# Patient Record
Sex: Female | Born: 1950 | Race: White | Hispanic: No | Marital: Married | Smoking: Former smoker
Health system: Southern US, Community
[De-identification: ages and names within clinical notes are randomized; demographics above are authoritative.]

## PROBLEM LIST (undated history)

## (undated) DIAGNOSIS — G8929 Other chronic pain: Secondary | ICD-10-CM

## (undated) DIAGNOSIS — Z8719 Personal history of other diseases of the digestive system: Secondary | ICD-10-CM

## (undated) DIAGNOSIS — Z972 Presence of dental prosthetic device (complete) (partial): Secondary | ICD-10-CM

## (undated) DIAGNOSIS — K5190 Ulcerative colitis, unspecified, without complications: Secondary | ICD-10-CM

## (undated) DIAGNOSIS — K219 Gastro-esophageal reflux disease without esophagitis: Secondary | ICD-10-CM

## (undated) DIAGNOSIS — E785 Hyperlipidemia, unspecified: Secondary | ICD-10-CM

## (undated) DIAGNOSIS — R519 Headache, unspecified: Secondary | ICD-10-CM

## (undated) DIAGNOSIS — G20 Parkinson's disease: Secondary | ICD-10-CM

## (undated) DIAGNOSIS — M5030 Other cervical disc degeneration, unspecified cervical region: Secondary | ICD-10-CM

## (undated) DIAGNOSIS — I739 Peripheral vascular disease, unspecified: Secondary | ICD-10-CM

## (undated) DIAGNOSIS — G20A1 Parkinson's disease without dyskinesia, without mention of fluctuations: Secondary | ICD-10-CM

## (undated) DIAGNOSIS — F329 Major depressive disorder, single episode, unspecified: Secondary | ICD-10-CM

## (undated) DIAGNOSIS — R0600 Dyspnea, unspecified: Secondary | ICD-10-CM

## (undated) DIAGNOSIS — E119 Type 2 diabetes mellitus without complications: Secondary | ICD-10-CM

## (undated) DIAGNOSIS — K449 Diaphragmatic hernia without obstruction or gangrene: Secondary | ICD-10-CM

## (undated) DIAGNOSIS — I6523 Occlusion and stenosis of bilateral carotid arteries: Secondary | ICD-10-CM

## (undated) DIAGNOSIS — F419 Anxiety disorder, unspecified: Secondary | ICD-10-CM

## (undated) DIAGNOSIS — Z7982 Long term (current) use of aspirin: Secondary | ICD-10-CM

## (undated) DIAGNOSIS — M545 Low back pain, unspecified: Secondary | ICD-10-CM

## (undated) DIAGNOSIS — R350 Frequency of micturition: Secondary | ICD-10-CM

## (undated) DIAGNOSIS — N3010 Interstitial cystitis (chronic) without hematuria: Secondary | ICD-10-CM

## (undated) DIAGNOSIS — D649 Anemia, unspecified: Secondary | ICD-10-CM

## (undated) DIAGNOSIS — R7303 Prediabetes: Secondary | ICD-10-CM

## (undated) DIAGNOSIS — J449 Chronic obstructive pulmonary disease, unspecified: Secondary | ICD-10-CM

## (undated) DIAGNOSIS — Z7901 Long term (current) use of anticoagulants: Secondary | ICD-10-CM

## (undated) DIAGNOSIS — I700 Atherosclerosis of aorta: Secondary | ICD-10-CM

## (undated) DIAGNOSIS — M519 Unspecified thoracic, thoracolumbar and lumbosacral intervertebral disc disorder: Secondary | ICD-10-CM

## (undated) DIAGNOSIS — I6789 Other cerebrovascular disease: Secondary | ICD-10-CM

## (undated) DIAGNOSIS — G459 Transient cerebral ischemic attack, unspecified: Secondary | ICD-10-CM

## (undated) DIAGNOSIS — F32A Depression, unspecified: Secondary | ICD-10-CM

## (undated) DIAGNOSIS — R51 Headache: Secondary | ICD-10-CM

## (undated) DIAGNOSIS — E871 Hypo-osmolality and hyponatremia: Secondary | ICD-10-CM

## (undated) DIAGNOSIS — T753XXA Motion sickness, initial encounter: Secondary | ICD-10-CM

## (undated) DIAGNOSIS — I10 Essential (primary) hypertension: Secondary | ICD-10-CM

## (undated) DIAGNOSIS — Z87440 Personal history of urinary (tract) infections: Secondary | ICD-10-CM

## (undated) DIAGNOSIS — M1990 Unspecified osteoarthritis, unspecified site: Secondary | ICD-10-CM

## (undated) DIAGNOSIS — I639 Cerebral infarction, unspecified: Secondary | ICD-10-CM

## (undated) DIAGNOSIS — M549 Dorsalgia, unspecified: Secondary | ICD-10-CM

## (undated) HISTORY — PX: BLADDER SUSPENSION: SHX72

## (undated) HISTORY — PX: ABDOMINAL HYSTERECTOMY: SHX81

## (undated) HISTORY — DX: Parkinson's disease: G20

## (undated) HISTORY — PX: TONSILLECTOMY: SUR1361

## (undated) HISTORY — DX: Parkinson's disease without dyskinesia, without mention of fluctuations: G20.A1

## (undated) HISTORY — PX: HERNIA REPAIR: SHX51

## (undated) HISTORY — PX: CHOLECYSTECTOMY: SHX55

## (undated) HISTORY — PX: CARDIAC CATHETERIZATION: SHX172

## (undated) HISTORY — DX: Type 2 diabetes mellitus without complications: E11.9

## (undated) HISTORY — PX: HYSTERECTOMY ABDOMINAL WITH SALPINGO-OOPHORECTOMY: SHX6792

## (undated) HISTORY — DX: Personal history of urinary (tract) infections: Z87.440

## (undated) HISTORY — PX: COLONOSCOPY: SHX174

## (undated) HISTORY — PX: BACK SURGERY: SHX140

## (undated) HISTORY — PX: UPPER GI ENDOSCOPY: SHX6162

## (undated) HISTORY — PX: DIAGNOSTIC LAPAROSCOPY: SUR761

## (undated) HISTORY — PX: CORONARY ANGIOPLASTY: SHX604

---

## 1998-03-30 ENCOUNTER — Emergency Department (HOSPITAL_COMMUNITY): Admission: EM | Admit: 1998-03-30 | Discharge: 1998-03-30 | Payer: Self-pay | Admitting: Emergency Medicine

## 2013-07-24 DIAGNOSIS — I639 Cerebral infarction, unspecified: Secondary | ICD-10-CM

## 2013-07-24 DIAGNOSIS — G459 Transient cerebral ischemic attack, unspecified: Secondary | ICD-10-CM

## 2013-07-24 HISTORY — DX: Cerebral infarction, unspecified: I63.9

## 2013-07-24 HISTORY — DX: Transient cerebral ischemic attack, unspecified: G45.9

## 2014-02-04 ENCOUNTER — Other Ambulatory Visit (HOSPITAL_COMMUNITY): Payer: Self-pay | Admitting: Family Medicine

## 2014-02-04 ENCOUNTER — Ambulatory Visit (HOSPITAL_COMMUNITY)
Admission: RE | Admit: 2014-02-04 | Discharge: 2014-02-04 | Disposition: A | Discharge status: Home / Self Care | Payer: BC Managed Care – PPO | Source: Ambulatory Visit | Attending: Family Medicine | Admitting: Family Medicine

## 2014-02-04 DIAGNOSIS — M79609 Pain in unspecified limb: Secondary | ICD-10-CM

## 2014-02-04 DIAGNOSIS — M25562 Pain in left knee: Secondary | ICD-10-CM

## 2014-02-04 DIAGNOSIS — M7989 Other specified soft tissue disorders: Secondary | ICD-10-CM

## 2014-02-04 DIAGNOSIS — M25569 Pain in unspecified knee: Secondary | ICD-10-CM | POA: Insufficient documentation

## 2014-02-04 NOTE — Progress Notes (Signed)
VASCULAR LAB PRELIMINARY  PRELIMINARY  PRELIMINARY  PRELIMINARY  Left lower extremity venous duplex completed.    Preliminary report:  Left:  No evidence of DVT, superficial thrombosis, or Baker's cyst.  Siddhartha Hoback, RVT 02/04/2014, 1:23 PM

## 2014-02-10 ENCOUNTER — Encounter (HOSPITAL_BASED_OUTPATIENT_CLINIC_OR_DEPARTMENT_OTHER): Payer: Self-pay | Admitting: *Deleted

## 2014-02-10 NOTE — Progress Notes (Signed)
Called for ekg bmet from pcp-if not will need dos

## 2014-02-12 ENCOUNTER — Other Ambulatory Visit: Payer: Self-pay | Admitting: Orthopedic Surgery

## 2014-02-13 ENCOUNTER — Encounter (HOSPITAL_BASED_OUTPATIENT_CLINIC_OR_DEPARTMENT_OTHER): Payer: BC Managed Care – PPO | Admitting: Anesthesiology

## 2014-02-13 ENCOUNTER — Encounter (HOSPITAL_BASED_OUTPATIENT_CLINIC_OR_DEPARTMENT_OTHER): Payer: Self-pay | Admitting: *Deleted

## 2014-02-13 ENCOUNTER — Encounter (HOSPITAL_BASED_OUTPATIENT_CLINIC_OR_DEPARTMENT_OTHER)
Admission: RE | Disposition: A | Discharge status: Home / Self Care | Payer: Self-pay | Source: Ambulatory Visit | Attending: Orthopedic Surgery

## 2014-02-13 ENCOUNTER — Ambulatory Visit (HOSPITAL_BASED_OUTPATIENT_CLINIC_OR_DEPARTMENT_OTHER)
Admission: RE | Admit: 2014-02-13 | Discharge: 2014-02-13 | Disposition: A | Discharge status: Home / Self Care | Payer: BC Managed Care – PPO | Source: Ambulatory Visit | Attending: Orthopedic Surgery | Admitting: Orthopedic Surgery

## 2014-02-13 ENCOUNTER — Ambulatory Visit (HOSPITAL_BASED_OUTPATIENT_CLINIC_OR_DEPARTMENT_OTHER): Payer: BC Managed Care – PPO | Admitting: Anesthesiology

## 2014-02-13 DIAGNOSIS — M23329 Other meniscus derangements, posterior horn of medial meniscus, unspecified knee: Secondary | ICD-10-CM | POA: Insufficient documentation

## 2014-02-13 DIAGNOSIS — J449 Chronic obstructive pulmonary disease, unspecified: Secondary | ICD-10-CM | POA: Insufficient documentation

## 2014-02-13 DIAGNOSIS — E669 Obesity, unspecified: Secondary | ICD-10-CM | POA: Insufficient documentation

## 2014-02-13 DIAGNOSIS — Z6833 Body mass index (BMI) 33.0-33.9, adult: Secondary | ICD-10-CM | POA: Insufficient documentation

## 2014-02-13 DIAGNOSIS — F17200 Nicotine dependence, unspecified, uncomplicated: Secondary | ICD-10-CM | POA: Insufficient documentation

## 2014-02-13 DIAGNOSIS — K219 Gastro-esophageal reflux disease without esophagitis: Secondary | ICD-10-CM | POA: Insufficient documentation

## 2014-02-13 DIAGNOSIS — J4489 Other specified chronic obstructive pulmonary disease: Secondary | ICD-10-CM | POA: Insufficient documentation

## 2014-02-13 DIAGNOSIS — M129 Arthropathy, unspecified: Secondary | ICD-10-CM | POA: Insufficient documentation

## 2014-02-13 DIAGNOSIS — I10 Essential (primary) hypertension: Secondary | ICD-10-CM | POA: Insufficient documentation

## 2014-02-13 DIAGNOSIS — K5190 Ulcerative colitis, unspecified, without complications: Secondary | ICD-10-CM | POA: Insufficient documentation

## 2014-02-13 HISTORY — DX: Gastro-esophageal reflux disease without esophagitis: K21.9

## 2014-02-13 HISTORY — DX: Frequency of micturition: R35.0

## 2014-02-13 HISTORY — DX: Essential (primary) hypertension: I10

## 2014-02-13 HISTORY — DX: Unspecified osteoarthritis, unspecified site: M19.90

## 2014-02-13 HISTORY — PX: KNEE ARTHROSCOPY: SHX127

## 2014-02-13 HISTORY — DX: Chronic obstructive pulmonary disease, unspecified: J44.9

## 2014-02-13 HISTORY — DX: Ulcerative colitis, unspecified, without complications: K51.90

## 2014-02-13 LAB — POCT HEMOGLOBIN-HEMACUE: HEMOGLOBIN: 12.4 g/dL (ref 12.0–15.0)

## 2014-02-13 SURGERY — ARTHROSCOPY, KNEE
Anesthesia: General | Site: Knee | Laterality: Left

## 2014-02-13 MED ORDER — FENTANYL CITRATE 0.05 MG/ML IJ SOLN
INTRAMUSCULAR | Status: DC | PRN
  Administered 2014-02-13: 25 ug via INTRAVENOUS
  Administered 2014-02-13 (×2): 50 ug via INTRAVENOUS
  Administered 2014-02-13: 25 ug via INTRAVENOUS

## 2014-02-13 MED ORDER — PROPOFOL 10 MG/ML IV BOLUS
INTRAVENOUS | Status: DC | PRN
  Administered 2014-02-13: 200 mg via INTRAVENOUS

## 2014-02-13 MED ORDER — OXYCODONE HCL 5 MG/5ML PO SOLN: 5.0000 mg | Freq: Once | ORAL | Status: AC | PRN

## 2014-02-13 MED ORDER — EPINEPHRINE HCL 1 MG/ML IJ SOLN
INTRAMUSCULAR | Status: AC
  Filled 2014-02-13: qty 1

## 2014-02-13 MED ORDER — MIDAZOLAM HCL 5 MG/5ML IJ SOLN
INTRAMUSCULAR | Status: DC | PRN
  Administered 2014-02-13: 2 mg via INTRAVENOUS

## 2014-02-13 MED ORDER — ONDANSETRON HCL 4 MG/2ML IJ SOLN
INTRAMUSCULAR | Status: DC | PRN
  Administered 2014-02-13: 4 mg via INTRAVENOUS

## 2014-02-13 MED ORDER — LIDOCAINE HCL (CARDIAC) 20 MG/ML IV SOLN
INTRAVENOUS | Status: DC | PRN
  Administered 2014-02-13: 100 mg via INTRAVENOUS

## 2014-02-13 MED ORDER — OXYCODONE HCL 5 MG PO TABS
5.0000 mg | ORAL_TABLET | Freq: Once | ORAL | Status: AC | PRN
  Administered 2014-02-13: 5 mg via ORAL

## 2014-02-13 MED ORDER — SODIUM CHLORIDE 0.9 % IR SOLN
Status: DC | PRN
  Administered 2014-02-13: 3000 mL

## 2014-02-13 MED ORDER — LACTATED RINGERS IV SOLN
INTRAVENOUS | Status: DC
  Administered 2014-02-13 (×2): via INTRAVENOUS

## 2014-02-13 MED ORDER — FENTANYL CITRATE 0.05 MG/ML IJ SOLN
INTRAMUSCULAR | Status: AC
  Filled 2014-02-13: qty 6

## 2014-02-13 MED ORDER — CHLORHEXIDINE GLUCONATE 4 % EX LIQD
60.0000 mL | Freq: Once | CUTANEOUS | Status: DC
Start: 2014-02-13 — End: 2014-02-13

## 2014-02-13 MED ORDER — MIDAZOLAM HCL 2 MG/2ML IJ SOLN
INTRAMUSCULAR | Status: AC
  Filled 2014-02-13: qty 2

## 2014-02-13 MED ORDER — DEXAMETHASONE SODIUM PHOSPHATE 10 MG/ML IJ SOLN
INTRAMUSCULAR | Status: DC | PRN
  Administered 2014-02-13: 10 mg via INTRAVENOUS

## 2014-02-13 MED ORDER — CEFAZOLIN SODIUM-DEXTROSE 2-3 GM-% IV SOLR
INTRAVENOUS | Status: AC
  Filled 2014-02-13: qty 50

## 2014-02-13 MED ORDER — OXYCODONE HCL 5 MG PO TABS
ORAL_TABLET | ORAL | Status: AC
  Filled 2014-02-13: qty 1

## 2014-02-13 MED ORDER — OXYCODONE HCL 10 MG PO TABS: 10.0000 mg | ORAL_TABLET | Freq: Four times a day (QID) | ORAL | Status: DC | PRN

## 2014-02-13 MED ORDER — CEFAZOLIN SODIUM-DEXTROSE 2-3 GM-% IV SOLR
2.0000 g | INTRAVENOUS | Status: AC
  Administered 2014-02-13: 2 g via INTRAVENOUS

## 2014-02-13 MED ORDER — MIDAZOLAM HCL 2 MG/2ML IJ SOLN: 1.0000 mg | INTRAMUSCULAR | Status: DC | PRN

## 2014-02-13 MED ORDER — OXYCODONE-ACETAMINOPHEN 5-325 MG PO TABS: 1.0000 | ORAL_TABLET | Freq: Four times a day (QID) | ORAL | Status: DC | PRN

## 2014-02-13 MED ORDER — FENTANYL CITRATE 0.05 MG/ML IJ SOLN
INTRAMUSCULAR | Status: AC
  Filled 2014-02-13: qty 2

## 2014-02-13 MED ORDER — BUPIVACAINE HCL (PF) 0.25 % IJ SOLN
INTRAMUSCULAR | Status: AC
  Filled 2014-02-13: qty 30

## 2014-02-13 MED ORDER — ONDANSETRON HCL 4 MG/2ML IJ SOLN: 4.0000 mg | Freq: Once | INTRAMUSCULAR | Status: DC | PRN

## 2014-02-13 MED ORDER — FENTANYL CITRATE 0.05 MG/ML IJ SOLN: 50.0000 ug | INTRAMUSCULAR | Status: DC | PRN

## 2014-02-13 MED ORDER — BUPIVACAINE HCL (PF) 0.5 % IJ SOLN
INTRAMUSCULAR | Status: DC | PRN
  Administered 2014-02-13: 20 mL

## 2014-02-13 MED ORDER — HYDROMORPHONE HCL PF 1 MG/ML IJ SOLN: 0.2500 mg | INTRAMUSCULAR | Status: DC | PRN

## 2014-02-13 SURGICAL SUPPLY — 40 items
BANDAGE ELASTIC 6 VELCRO ST LF (GAUZE/BANDAGES/DRESSINGS) ×2 IMPLANT
BLADE 4.2CUDA (BLADE) IMPLANT
BLADE GREAT WHITE 4.2 (BLADE) ×2 IMPLANT
CANISTER SUCT 3000ML (MISCELLANEOUS) IMPLANT
CUTTER MENISCUS  4.2MM (BLADE) ×1
CUTTER MENISCUS 4.2MM (BLADE) ×1 IMPLANT
DRAPE ARTHROSCOPY W/POUCH 114 (DRAPES) ×2 IMPLANT
DRSG EMULSION OIL 3X3 NADH (GAUZE/BANDAGES/DRESSINGS) ×2 IMPLANT
DURAPREP 26ML APPLICATOR (WOUND CARE) ×2 IMPLANT
ELECT MENISCUS 165MM 90D (ELECTRODE) IMPLANT
ELECT REM PT RETURN 9FT ADLT (ELECTROSURGICAL)
ELECTRODE REM PT RTRN 9FT ADLT (ELECTROSURGICAL) IMPLANT
GAUZE SPONGE 4X4 12PLY STRL (GAUZE/BANDAGES/DRESSINGS) ×2 IMPLANT
GLOVE BIO SURGEON STRL SZ7 (GLOVE) ×2 IMPLANT
GLOVE BIOGEL PI IND STRL 7.5 (GLOVE) ×2 IMPLANT
GLOVE BIOGEL PI IND STRL 8 (GLOVE) ×2 IMPLANT
GLOVE BIOGEL PI INDICATOR 7.5 (GLOVE) ×2
GLOVE BIOGEL PI INDICATOR 8 (GLOVE) ×2
GLOVE ECLIPSE 7.5 STRL STRAW (GLOVE) ×4 IMPLANT
GOWN STRL REUS W/ TWL LRG LVL3 (GOWN DISPOSABLE) ×1 IMPLANT
GOWN STRL REUS W/ TWL XL LVL3 (GOWN DISPOSABLE) ×1 IMPLANT
GOWN STRL REUS W/TWL LRG LVL3 (GOWN DISPOSABLE) ×1
GOWN STRL REUS W/TWL XL LVL3 (GOWN DISPOSABLE) ×3 IMPLANT
HOLDER KNEE FOAM BLUE (MISCELLANEOUS) ×2 IMPLANT
KNEE WRAP E Z 3 GEL PACK (MISCELLANEOUS) ×2 IMPLANT
MANIFOLD NEPTUNE II (INSTRUMENTS) ×2 IMPLANT
NDL SAFETY ECLIPSE 18X1.5 (NEEDLE) IMPLANT
NEEDLE HYPO 18GX1.5 SHARP (NEEDLE)
PACK ARTHROSCOPY DSU (CUSTOM PROCEDURE TRAY) ×2 IMPLANT
PACK BASIN DAY SURGERY FS (CUSTOM PROCEDURE TRAY) ×2 IMPLANT
PAD CAST 4YDX4 CTTN HI CHSV (CAST SUPPLIES) ×1 IMPLANT
PADDING CAST COTTON 4X4 STRL (CAST SUPPLIES) ×1
PENCIL BUTTON HOLSTER BLD 10FT (ELECTRODE) IMPLANT
SET ARTHROSCOPY TUBING (MISCELLANEOUS) ×1
SET ARTHROSCOPY TUBING LN (MISCELLANEOUS) ×1 IMPLANT
SUT ETHILON 4 0 PS 2 18 (SUTURE) IMPLANT
SYRINGE 6CC (MISCELLANEOUS) ×2 IMPLANT
TOWEL OR 17X24 6PK STRL BLUE (TOWEL DISPOSABLE) IMPLANT
TOWEL OR NON WOVEN STRL DISP B (DISPOSABLE) ×2 IMPLANT
WATER STERILE IRR 1000ML POUR (IV SOLUTION) ×2 IMPLANT

## 2014-02-13 NOTE — Op Note (Signed)
02/13/2014  2:30 PM  PATIENT:  Tammy Lamb  63 y.o. female  PRE-OPERATIVE DIAGNOSIS:  MEDIAL MENISCUS TEAR LEFT KNEE  POST-OPERATIVE DIAGNOSIS:  MEDIAL MENISCUS TEAR LEFT KNEE  PROCEDURE:  Procedure(s): LEFT ARTHROSCOPY KNEE, PARTIAL MEDIAL MENISECTOMY AND PLICA, CHONDROPLASTY OF PATELLA FEMORAL JOINT (Left)  SURGEON:  Surgeon(s) and Role:    * Alta Corning, MD - Primary  PHYSICIAN ASSISTANT:   ASSISTANTS: bethune   ANESTHESIA:   general  EBL:  Total I/O In: 1000 [I.V.:1000] Out: -   BLOOD ADMINISTERED:none  DRAINS: none   LOCAL MEDICATIONS USED:  MARCAINE     SPECIMEN:  No Specimen  DISPOSITION OF SPECIMEN:  N/A  COUNTS:  YES  TOURNIQUET:  * No tourniquets in log *  DICTATION: .Other Dictation: Dictation Number [id number]  PLAN OF CARE: Discharge to home after PACU  PATIENT DISPOSITION:  PACU - hemodynamically stable.   Delay start of Pharmacological VTE agent (>24hrs) due to surgical blood loss or risk of bleeding: no

## 2014-02-13 NOTE — Anesthesia Procedure Notes (Signed)
Procedure Name: LMA Insertion Date/Time: 02/13/2014 1:51 PM Performed by: Lieutenant Diego Pre-anesthesia Checklist: Patient identified, Emergency Drugs available, Suction available and Patient being monitored Patient Re-evaluated:Patient Re-evaluated prior to inductionOxygen Delivery Method: Circle System Utilized Preoxygenation: Pre-oxygenation with 100% oxygen Intubation Type: IV induction Ventilation: Mask ventilation without difficulty LMA: LMA inserted LMA Size: 4.0 Number of attempts: 1 Airway Equipment and Method: bite block Placement Confirmation: positive ETCO2 and breath sounds checked- equal and bilateral Tube secured with: Tape Dental Injury: Teeth and Oropharynx as per pre-operative assessment

## 2014-02-13 NOTE — Anesthesia Postprocedure Evaluation (Signed)
  Anesthesia Post-op Note  Patient: Tammy Lamb  Procedure(s) Performed: Procedure(s): LEFT ARTHROSCOPY KNEE, PARTIAL MEDIAL MENISECTOMY AND PLICA, CHONDROPLASTY OF PATELLA FEMORAL JOINT (Left)  Patient Location: PACU  Anesthesia Type:General  Level of Consciousness: awake and alert   Airway and Oxygen Therapy: Patient Spontanous Breathing  Post-op Pain: mild  Post-op Assessment: Post-op Vital signs reviewed  Post-op Vital Signs: stable  Last Vitals:  Filed Vitals:   02/13/14 1559  BP: 140/78  Pulse: 71  Temp: 36.6 C  Resp: 16    Complications: No apparent anesthesia complications

## 2014-02-13 NOTE — Transfer of Care (Signed)
Immediate Anesthesia Transfer of Care Note  Patient: Tammy Lamb  Procedure(s) Performed: Procedure(s): LEFT ARTHROSCOPY KNEE, PARTIAL MEDIAL MENISECTOMY AND PLICA, CHONDROPLASTY OF PATELLA FEMORAL JOINT (Left)  Patient Location: PACU  Anesthesia Type:General  Level of Consciousness: sedated  Airway & Oxygen Therapy: Patient Spontanous Breathing and Patient connected to face mask oxygen  Post-op Assessment: Report given to PACU RN and Post -op Vital signs reviewed and stable  Post vital signs: Reviewed and stable  Complications: No apparent anesthesia complications

## 2014-02-13 NOTE — Anesthesia Preprocedure Evaluation (Signed)
Anesthesia Evaluation  Patient identified by MRN, date of birth, ID band Patient awake    Reviewed: Allergy & Precautions, H&P , NPO status , Patient's Chart, lab work & pertinent test results, reviewed documented beta blocker date and time   Airway Mallampati: II TM Distance: >3 FB Neck ROM: Full    Dental  (+) Partial Upper, Dental Advisory Given   Pulmonary Current Smoker,  breath sounds clear to auscultation        Cardiovascular hypertension, Rhythm:Regular Rate:Normal     Neuro/Psych    GI/Hepatic   Endo/Other    Renal/GU      Musculoskeletal   Abdominal (+) + obese,   Peds  Hematology   Anesthesia Other Findings   Reproductive/Obstetrics                           Anesthesia Physical Anesthesia Plan  ASA: III  Anesthesia Plan: General   Post-op Pain Management:    Induction: Intravenous  Airway Management Planned: LMA  Additional Equipment:   Intra-op Plan:   Post-operative Plan:   Informed Consent: I have reviewed the patients History and Physical, chart, labs and discussed the procedure including the risks, benefits and alternatives for the proposed anesthesia with the patient or authorized representative who has indicated his/her understanding and acceptance.   Dental advisory given  Plan Discussed with: CRNA and Anesthesiologist  Anesthesia Plan Comments: (MMT L. Knee Htn GERD H/O ullcerartive colitis  Plan GA with LMA)        Anesthesia Quick Evaluation

## 2014-02-13 NOTE — H&P (Signed)
PREOPERATIVE H&P  Chief Complaint: l knee pain  HPI: Tammy Lamb is a 63 y.o. female who presents for evaluation of l knee pain. It has been present for 6 mnoths and has been worsening. She has failed conservative measures. Pain is rated as moderate.  Past Medical History  Diagnosis Date  . Hypertension   . GERD (gastroesophageal reflux disease)   . Arthritis   . Colitis, ulcerative   . Urinary frequency   . COPD (chronic obstructive pulmonary disease)    Past Surgical History  Procedure Laterality Date  . Tonsillectomy    . Cholecystectomy    . Abdominal hysterectomy      BSO  . Diagnostic laparoscopy      adhesions  . Bladder suspension    . Bladder suspension      tac  . Colonoscopy    . Upper gi endoscopy     History   Social History  . Marital Status: Married    Spouse Name: N/A    Number of Children: N/A  . Years of Education: N/A   Social History Main Topics  . Smoking status: Current Every Day Smoker -- 1.00 packs/day  . Smokeless tobacco: None  . Alcohol Use: Yes  . Drug Use: No  . Sexual Activity: None   Other Topics Concern  . None   Social History Narrative  . None   History reviewed. No pertinent family history. Allergies  Allergen Reactions  . Sulfa Antibiotics Nausea And Vomiting   Prior to Admission medications   Medication Sig Start Date End Date Taking? Authorizing Provider  albuterol (PROVENTIL HFA;VENTOLIN HFA) 108 (90 BASE) MCG/ACT inhaler Inhale into the lungs every 6 (six) hours as needed for wheezing or shortness of breath.   Yes Historical Provider, MD  atorvastatin (LIPITOR) 80 MG tablet Take 80 mg by mouth daily at 6 PM.   Yes Historical Provider, MD  carvedilol (COREG) 12.5 MG tablet Take 12.5 mg by mouth 2 (two) times daily with a meal.   Yes Historical Provider, MD  cholecalciferol (VITAMIN D) 1000 UNITS tablet Take 1,000 Units by mouth daily.   Yes Historical Provider, MD  Cinnamon 500 MG TABS Take by mouth.   Yes  Historical Provider, MD  citalopram (CELEXA) 20 MG tablet Take 20 mg by mouth every evening.   Yes Historical Provider, MD  gabapentin (NEURONTIN) 300 MG capsule Take 300 mg by mouth 3 (three) times daily.   Yes Historical Provider, MD  hydrochlorothiazide (HYDRODIURIL) 25 MG tablet Take 25 mg by mouth daily.   Yes Historical Provider, MD  HYDROcodone-acetaminophen (NORCO/VICODIN) 5-325 MG per tablet Take 1 tablet by mouth every 6 (six) hours as needed for moderate pain.   Yes Historical Provider, MD  loperamide (IMODIUM) 2 MG capsule Take by mouth as needed for diarrhea or loose stools.   Yes Historical Provider, MD  omeprazole-sodium bicarbonate (ZEGERID) 40-1100 MG per capsule Take 1 capsule by mouth 2 (two) times daily before a meal.   Yes Historical Provider, MD  pentosan polysulfate (ELMIRON) 100 MG capsule Take 100 mg by mouth 2 (two) times daily before a meal.   Yes Historical Provider, MD  ramipril (ALTACE) 10 MG capsule Take 10 mg by mouth daily.   Yes Historical Provider, MD  ranitidine (ZANTAC) 150 MG tablet Take 150 mg by mouth as needed for heartburn.   Yes Historical Provider, MD  Oxycodone HCl 10 MG TABS Take 1 tablet (10 mg total) by mouth every 6 (six) hours  as needed (Pain.). 02/13/14   Erlene Senters, PA-C  Oxycodone HCl 10 MG TABS Take 1 tablet (10 mg total) by mouth every 6 (six) hours as needed (pain.). 02/13/14   Erlene Senters, PA-C     Positive ROS: none  All other systems have been reviewed and were otherwise negative with the exception of those mentioned in the HPI and as above.  Physical Exam: Filed Vitals:   02/13/14 1249  BP: 118/81  Pulse: 67  Temp: 98.7 F (37.1 C)  Resp: 18    General: Alert, no acute distress Cardiovascular: No pedal edema Respiratory: No cyanosis, no use of accessory musculature GI: No organomegaly, abdomen is soft and non-tender Skin: No lesions in the area of chief complaint Neurologic: Sensation intact distally Psychiatric:  Patient is competent for consent with normal mood and affect Lymphatic: No axillary or cervical lymphadenopathy  MUSCULOSKELETAL: l knee: + ttp over med jt line  +[place]  Assessment/Plan: MEDIAL MENISCUS TEAR LEFT KNEE Plan for Procedure(s): LEFT ARTHROSCOPY KNEE  The risks benefits and alternatives were discussed with the patient including but not limited to the risks of nonoperative treatment, versus surgical intervention including infection, bleeding, nerve injury, malunion, nonunion, hardware prominence, hardware failure, need for hardware removal, blood clots, cardiopulmonary complications, morbidity, mortality, among others, and they were willing to proceed.  Predicted outcome is good, although there will be at least a six to nine month expected recovery.  Alta Corning, MD 02/13/2014 1:38 PM

## 2014-02-13 NOTE — Discharge Instructions (Signed)
POST-OP KNEE ARTHROSCOPY INSTRUCTIONS  Dr. Alain Marion PA-C  Pain You will be expected to have a moderate amount of pain in the affected knee for approximately two weeks. However, the first two days will be the most severe pain. A prescription has been provided to take as needed for the pain. The pain can be reduced by applying ice packs to the knee for the first 1-2 weeks post surgery. Also, keeping the leg elevated on pillows will help alleviate the pain. If you develop any acute pain or swelling in your calf muscle, please call the doctor.  Activity It is preferred that you stay at bed rest for approximately 24 hours. However, you may go to the bathroom with help. Weight bearing as tolerated. You may begin the knee exercises the day of surgery. Discontinue crutches as the knee pain resolves.  Dressing Keep the dressing dry. If the ace bandage should wrinkle or roll up, this can be rewrapped to prevent ridges in the bandage. You may remove all dressings in 48 hours,  apply bandaids to each wound. You may shower on the 4th day after surgery but no tub bath.  Symptoms to report to your doctor Extreme pain Extreme swelling Temperature above 101 degrees Change in the feeling, color, or movement of your toes Redness, heat, or swelling at your incision  Exercise If is preferred that as soon as possible you try to do a straight leg raise without bending the knee and concentrate on bringing the heel of your foot off the bed up to approximately 45 degrees and hold for the count of 10 seconds. Repeat this at least 10 times three or four times per day. Additional exercises are provided below.  You are encouraged to bend the knee as tolerated.  Follow-Up Call to schedule a follow-up appointment in 5-7 days. Our office # is [phone].  POST-OP EXERCISES  Short Arc Quads  1. Lie on back with legs straight. Place towel roll under thigh, just above knee. 2. Tighten thigh muscles to  straighten knee and lift heel off bed. 3. Hold for slow count of five, then lower. 4. Do three sets of ten    Straight Leg Raises  1. Lie on back with operative leg straight and other leg bent. 2. Keeping operative leg completely straight, slowly lift operative leg so foot is 5 inches off bed. 3. Hold for slow count of five, then lower. 4. Do three sets of ten.    DO BOTH EXERCISES 2 TIMES A DAY  Ankle Pumps  Work/move the operative ankle and foot up and down 10 times every hour while awake.   Post Anesthesia Home Care Instructions  Activity: Get plenty of rest for the remainder of the day. A responsible adult should stay with you for 24 hours following the procedure.  For the next 24 hours, DO NOT: -Drive a car -Paediatric nurse -Drink alcoholic beverages -Take any medication unless instructed by your physician -Make any legal decisions or sign important papers.  Meals: Start with liquid foods such as gelatin or soup. Progress to regular foods as tolerated. Avoid greasy, spicy, heavy foods. If nausea and/or vomiting occur, drink only clear liquids until the nausea and/or vomiting subsides. Call your physician if vomiting continues.  Special Instructions/Symptoms: Your throat may feel dry or sore from the anesthesia or the breathing tube placed in your throat during surgery. If this causes discomfort, gargle with warm salt water. The discomfort should disappear within 24 hours.

## 2014-02-14 NOTE — Op Note (Signed)
NAMECOURTNAY, PETRILLA                 ACCOUNT NO.:  [id number]  MEDICAL RECORD NO.:  [id number]  LOCATION:  VASC                         FACILITY:  [facility]  PHYSICIAN:  Alta Corning, M.D.   DATE OF BIRTH:  23-Nov-1950  DATE OF PROCEDURE:  02/13/2014 DATE OF DISCHARGE:  02/04/2014                              OPERATIVE REPORT   PREOPERATIVE DIAGNOSIS:  Medial meniscal tear.  POSTOPERATIVE DIAGNOSES: 1. Medial meniscal tear. 2. Chondromalacia of patellofemoral joint. 3. Medial shelf plica.  PROCEDURE: 1. Partial posterior horn medial meniscectomy with corresponding     debridement in the medial compartment. 2. Chondroplasty of patellofemoral joint. 3. Resection of medial shelf plica.  SURGEON:  Alta Corning, MD  ASSISTANT:  Gary Fleet, PA  ANESTHESIA:  General.  BRIEF HISTORY:  Ms. Guilfoil is a 63 year old female with history of significant complaints of left knee pain.  She had been treated conservatively for a period of time.  She had failed injection therapy, activity modification, and after failure of all conservative care, MRI had been obtained back in October of last year, which showed a medial meniscal tear, but after the patient had not gotten better over that long period of time, she was taken to the operating room for left knee arthroscopy.  DESCRIPTION OF PROCEDURE:  The patient was taken to the operating room. After adequate anesthesia was obtained with general anesthetic, the patient was placed supine on the operating table.  The left leg was prepped and draped in usual sterile fashion.  Following this, routine arthroscopic examination of knee revealed there was a classic posterior horn meniscal tear with a radial and complex portion.  This meniscus tear was resected back to a smooth and stable rim, did unfortunately go right around to the meniscal root which had a small detachment, which we debrided as well.  At this point, the medial femoral condyle  was identified, had some grade 2 change which was debrided.  ACL normal, lateral side normal but patellofemoral joint had some grade 2 and grade 3 changes which were debrided.  There was large medial shelf plica which was draping and blocking entrance into the medial compartment and so before gaining entrance to the medial compartment, needed to do a resection of this back to a stable rim against the medial capsule.  Once this was done, the knee was copiously and thoroughly lavaged with normal saline irrigation and suctioned dry.  All sterile portals were closed with the bandage. Sterile compressive dressing was applied after 20 mL of 0.25% Marcaine had been instilled in the knee for postoperative anesthesia.     Alta Corning, M.D.     Corliss Skains  D:  02/13/2014  T:  02/13/2014  Job:  [id number]

## 2014-02-16 ENCOUNTER — Encounter (HOSPITAL_BASED_OUTPATIENT_CLINIC_OR_DEPARTMENT_OTHER): Payer: Self-pay | Admitting: Orthopedic Surgery

## 2016-02-10 ENCOUNTER — Other Ambulatory Visit: Payer: Self-pay | Admitting: Podiatry

## 2016-02-10 DIAGNOSIS — IMO0002 Reserved for concepts with insufficient information to code with codable children: Secondary | ICD-10-CM

## 2016-02-10 DIAGNOSIS — T148XXA Other injury of unspecified body region, initial encounter: Secondary | ICD-10-CM

## 2016-02-10 DIAGNOSIS — M7672 Peroneal tendinitis, left leg: Secondary | ICD-10-CM

## 2016-02-23 ENCOUNTER — Ambulatory Visit
Admission: RE | Admit: 2016-02-23 | Discharge: 2016-02-23 | Disposition: A | Discharge status: Home / Self Care | Payer: Medicare Other | Source: Ambulatory Visit | Attending: Podiatry | Admitting: Podiatry

## 2016-02-23 DIAGNOSIS — S99912A Unspecified injury of left ankle, initial encounter: Secondary | ICD-10-CM | POA: Insufficient documentation

## 2016-02-23 DIAGNOSIS — M7672 Peroneal tendinitis, left leg: Secondary | ICD-10-CM

## 2016-02-23 DIAGNOSIS — T148XXA Other injury of unspecified body region, initial encounter: Secondary | ICD-10-CM

## 2016-02-23 DIAGNOSIS — S96812A Strain of other specified muscles and tendons at ankle and foot level, left foot, initial encounter: Secondary | ICD-10-CM | POA: Insufficient documentation

## 2016-02-23 DIAGNOSIS — IMO0002 Reserved for concepts with insufficient information to code with codable children: Secondary | ICD-10-CM

## 2016-03-15 NOTE — Discharge Instructions (Signed)
[place] [address] [employer]   1. Take your medication as prescribed.  Pain medication should be taken only as needed.  2. Keep the dressing clean, dry and intact.  3. Keep your foot elevated above the heart level for the first 48 hours.  4. Walking to the bathroom and brief periods of walking are acceptable, unless we have instructed you to be non-weight bearing.  5. Always wear your post-op shoe when walking.  Always use your crutches if you are to be non-weight bearing.  6. Do not take a shower. Baths are permissible as long as the foot is kept out of the water.   7. Every hour you are awake:  - Bend your knee 15 times. - Flex foot 15 times - Massage calf 15 times  8. Call [facility] [phone]) if any of the following problems occur: - You develop a temperature or fever. - The bandage becomes saturated with blood. - Medication does not stop your pain. - Injury of the foot occurs. - Any symptoms of infection including redness, odor, or red streaks running from wound.  General Anesthesia, Adult, Care After Refer to this sheet in the next few weeks. These instructions provide you with information on caring for yourself after your procedure. Your health care provider may also give you more specific instructions. Your treatment has been planned according to current medical practices, but problems sometimes occur. Call your health care provider if you have any problems or questions after your procedure. WHAT TO EXPECT AFTER THE PROCEDURE After the procedure, it is typical to experience:  Sleepiness.  Nausea and vomiting. HOME CARE INSTRUCTIONS  For the first 24 hours after general anesthesia:  Have a responsible person with you.  Do not drive a car. If you are alone, do not take public transportation.  Do not drink alcohol.  Do not take  medicine that has not been prescribed by your health care provider.  Do not sign important papers or make important decisions.  You may resume a normal diet and activities as directed by your health care provider.  Change bandages (dressings) as directed.  If you have questions or problems that seem related to general anesthesia, call the hospital and ask for the anesthetist or anesthesiologist on call. SEEK MEDICAL CARE IF:  You have nausea and vomiting that continue the day after anesthesia.  You develop a rash. SEEK IMMEDIATE MEDICAL CARE IF:   You have difficulty breathing.  You have chest pain.  You have any allergic problems.   This information is not intended to replace advice given to you by your health care provider. Make sure you discuss any questions you have with your health care provider.   Document Released: 10/16/2000 Document Revised: 07/31/2014 Document Reviewed: 11/08/2011 Elsevier Interactive Patient Education [employer].

## 2016-03-16 ENCOUNTER — Encounter: Payer: Self-pay | Admitting: *Deleted

## 2016-03-22 ENCOUNTER — Ambulatory Visit: Payer: Medicare Other | Admitting: Anesthesiology

## 2016-03-22 ENCOUNTER — Ambulatory Visit
Admission: RE | Admit: 2016-03-22 | Discharge: 2016-03-22 | Disposition: A | Discharge status: Home / Self Care | Payer: Medicare Other | Source: Ambulatory Visit | Attending: Podiatry | Admitting: Podiatry

## 2016-03-22 ENCOUNTER — Encounter
Admission: RE | Disposition: A | Discharge status: Home / Self Care | Payer: Self-pay | Source: Ambulatory Visit | Attending: Podiatry

## 2016-03-22 ENCOUNTER — Encounter: Payer: Self-pay | Admitting: Anesthesiology

## 2016-03-22 DIAGNOSIS — M659 Synovitis and tenosynovitis, unspecified: Secondary | ICD-10-CM | POA: Insufficient documentation

## 2016-03-22 DIAGNOSIS — Z6837 Body mass index (BMI) 37.0-37.9, adult: Secondary | ICD-10-CM | POA: Diagnosis not present

## 2016-03-22 DIAGNOSIS — S96812A Strain of other specified muscles and tendons at ankle and foot level, left foot, initial encounter: Secondary | ICD-10-CM | POA: Insufficient documentation

## 2016-03-22 DIAGNOSIS — F419 Anxiety disorder, unspecified: Secondary | ICD-10-CM | POA: Insufficient documentation

## 2016-03-22 DIAGNOSIS — K219 Gastro-esophageal reflux disease without esophagitis: Secondary | ICD-10-CM | POA: Diagnosis not present

## 2016-03-22 DIAGNOSIS — Z8673 Personal history of transient ischemic attack (TIA), and cerebral infarction without residual deficits: Secondary | ICD-10-CM | POA: Diagnosis not present

## 2016-03-22 DIAGNOSIS — I10 Essential (primary) hypertension: Secondary | ICD-10-CM | POA: Diagnosis not present

## 2016-03-22 DIAGNOSIS — Y939 Activity, unspecified: Secondary | ICD-10-CM | POA: Insufficient documentation

## 2016-03-22 DIAGNOSIS — X58XXXA Exposure to other specified factors, initial encounter: Secondary | ICD-10-CM | POA: Diagnosis not present

## 2016-03-22 DIAGNOSIS — M25872 Other specified joint disorders, left ankle and foot: Secondary | ICD-10-CM | POA: Diagnosis not present

## 2016-03-22 DIAGNOSIS — F17200 Nicotine dependence, unspecified, uncomplicated: Secondary | ICD-10-CM | POA: Insufficient documentation

## 2016-03-22 DIAGNOSIS — J449 Chronic obstructive pulmonary disease, unspecified: Secondary | ICD-10-CM | POA: Diagnosis not present

## 2016-03-22 DIAGNOSIS — K5190 Ulcerative colitis, unspecified, without complications: Secondary | ICD-10-CM | POA: Diagnosis not present

## 2016-03-22 DIAGNOSIS — R51 Headache: Secondary | ICD-10-CM | POA: Diagnosis not present

## 2016-03-22 DIAGNOSIS — M1990 Unspecified osteoarthritis, unspecified site: Secondary | ICD-10-CM | POA: Diagnosis not present

## 2016-03-22 HISTORY — DX: Headache, unspecified: R51.9

## 2016-03-22 HISTORY — DX: Presence of dental prosthetic device (complete) (partial): Z97.2

## 2016-03-22 HISTORY — DX: Motion sickness, initial encounter: T75.3XXA

## 2016-03-22 HISTORY — PX: TENDON REPAIR: SHX5111

## 2016-03-22 HISTORY — PX: ANKLE ARTHROSCOPY: SHX545

## 2016-03-22 HISTORY — DX: Headache: R51

## 2016-03-22 HISTORY — DX: Transient cerebral ischemic attack, unspecified: G45.9

## 2016-03-22 SURGERY — ARTHROSCOPY, ANKLE
Anesthesia: Regional | Site: Ankle | Laterality: Left | Wound class: Clean

## 2016-03-22 MED ORDER — DEXAMETHASONE SODIUM PHOSPHATE 4 MG/ML IJ SOLN
INTRAMUSCULAR | Status: DC | PRN
  Administered 2016-03-22: 8 mg via PERINEURAL

## 2016-03-22 MED ORDER — FENTANYL CITRATE (PF) 100 MCG/2ML IJ SOLN: 100.0000 ug | INTRAMUSCULAR | Status: DC | PRN

## 2016-03-22 MED ORDER — PROPOFOL 10 MG/ML IV BOLUS
INTRAVENOUS | Status: DC | PRN
  Administered 2016-03-22: 50 mg via INTRAVENOUS

## 2016-03-22 MED ORDER — OXYCODONE HCL 5 MG PO TABS
5.0000 mg | ORAL_TABLET | Freq: Once | ORAL | Status: AC | PRN
  Administered 2016-03-22: 5 mg via ORAL

## 2016-03-22 MED ORDER — ONDANSETRON HCL 4 MG PO TABS: 4.0000 mg | ORAL_TABLET | Freq: Four times a day (QID) | ORAL | Status: DC | PRN

## 2016-03-22 MED ORDER — ONDANSETRON HCL 4 MG/2ML IJ SOLN: 4.0000 mg | Freq: Four times a day (QID) | INTRAMUSCULAR | Status: DC | PRN

## 2016-03-22 MED ORDER — CEFAZOLIN SODIUM-DEXTROSE 2-4 GM/100ML-% IV SOLN
2.0000 g | Freq: Once | INTRAVENOUS | Status: AC
  Administered 2016-03-22: 2 g via INTRAVENOUS

## 2016-03-22 MED ORDER — OXYCODONE-ACETAMINOPHEN 5-325 MG PO TABS: 1.0000 | ORAL_TABLET | ORAL | Status: DC | PRN

## 2016-03-22 MED ORDER — LIDOCAINE-EPINEPHRINE 1 %-1:100000 IJ SOLN
INTRAMUSCULAR | Status: DC | PRN
  Administered 2016-03-22: 5 mL

## 2016-03-22 MED ORDER — FENTANYL CITRATE (PF) 100 MCG/2ML IJ SOLN
25.0000 ug | INTRAMUSCULAR | Status: AC | PRN
  Administered 2016-03-22 (×4): 25 ug via INTRAVENOUS

## 2016-03-22 MED ORDER — BUPIVACAINE HCL (PF) 0.25 % IJ SOLN
INTRAMUSCULAR | Status: DC | PRN
  Administered 2016-03-22: 10 mL

## 2016-03-22 MED ORDER — FENTANYL CITRATE (PF) 100 MCG/2ML IJ SOLN
INTRAMUSCULAR | Status: DC | PRN
  Administered 2016-03-22: 100 ug via INTRAVENOUS

## 2016-03-22 MED ORDER — MIDAZOLAM HCL 2 MG/2ML IJ SOLN
INTRAMUSCULAR | Status: DC | PRN
  Administered 2016-03-22: 2 mg via INTRAVENOUS

## 2016-03-22 MED ORDER — OXYCODONE-ACETAMINOPHEN 5-325 MG PO TABS: 1.0000 | ORAL_TABLET | ORAL | 0 refills | Status: DC | PRN

## 2016-03-22 MED ORDER — ROPIVACAINE HCL 5 MG/ML IJ SOLN
INTRAMUSCULAR | Status: DC | PRN
  Administered 2016-03-22 (×4): 10 mL via PERINEURAL

## 2016-03-22 MED ORDER — LACTATED RINGERS IV SOLN
INTRAVENOUS | Status: DC
  Administered 2016-03-22: 11:00:00 via INTRAVENOUS

## 2016-03-22 MED ORDER — LIDOCAINE HCL (CARDIAC) 20 MG/ML IV SOLN
INTRAVENOUS | Status: DC | PRN
  Administered 2016-03-22: 40 mg via INTRATRACHEAL

## 2016-03-22 MED ORDER — ONDANSETRON HCL 4 MG/2ML IJ SOLN
INTRAMUSCULAR | Status: DC | PRN
  Administered 2016-03-22: 4 mg via INTRAVENOUS

## 2016-03-22 SURGICAL SUPPLY — 56 items
ARTHROWAND PARAGON T2 (SURGICAL WAND) ×2
BANDAGE ACE 4X5 VEL STRL LF (GAUZE/BANDAGES/DRESSINGS) ×2 IMPLANT
BANDAGE ELASTIC 3 CLIP NS LF (GAUZE/BANDAGES/DRESSINGS) IMPLANT
BANDAGE ELASTIC 4 CLIP NS LF (GAUZE/BANDAGES/DRESSINGS) IMPLANT
BLADE SURG 15 STRL LF DISP TIS (BLADE) ×1 IMPLANT
BLADE SURG 15 STRL SS (BLADE) ×1
BNDG COHESIVE 4X5 TAN STRL (GAUZE/BANDAGES/DRESSINGS) ×2 IMPLANT
BNDG ESMARK 4X12 TAN STRL LF (GAUZE/BANDAGES/DRESSINGS) ×2 IMPLANT
BNDG GAUZE 4.5X4.1 6PLY STRL (MISCELLANEOUS) ×4 IMPLANT
BNDG STRETCH 4X75 STRL LF (GAUZE/BANDAGES/DRESSINGS) ×2 IMPLANT
BUR AGGRESSIVE+ 2.5 (BURR) ×2 IMPLANT
CUFF TOURN SGL QUICK 18 (TOURNIQUET CUFF) IMPLANT
CUFF TOURN SGL QUICK 24 (TOURNIQUET CUFF)
CUFF TOURN SGL QUICK 30 (MISCELLANEOUS) ×1
CUFF TOURN SGL QUICK 34 (TOURNIQUET CUFF)
CUFF TRNQT CYL 24X4X40X1 (TOURNIQUET CUFF) IMPLANT
CUFF TRNQT CYL 34X4X40X1 (TOURNIQUET CUFF) IMPLANT
CUFF TRNQT CYL LO 30X4X (MISCELLANEOUS) ×1 IMPLANT
DURAPREP 26ML APPLICATOR (WOUND CARE) ×2 IMPLANT
ETHIBOND 2 0 GREEN CT 2 30IN (SUTURE) IMPLANT
GLOVE BIO SURGEON STRL SZ7.5 (GLOVE) ×4 IMPLANT
GLOVE INDICATOR 8.0 STRL GRN (GLOVE) ×4 IMPLANT
GOWN STRL REUS W/ TWL LRG LVL3 (GOWN DISPOSABLE) ×2 IMPLANT
GOWN STRL REUS W/TWL LRG LVL3 (GOWN DISPOSABLE) ×2
IV LACTATED RINGER IRRG 3000ML (IV SOLUTION) ×2
IV LR IRRIG 3000ML ARTHROMATIC (IV SOLUTION) ×2 IMPLANT
KIT ROOM TURNOVER OR (KITS) ×2 IMPLANT
MANIFOLD 4PT FOR NEPTUNE1 (MISCELLANEOUS) ×2 IMPLANT
NDL SAFETY 18GX1.5 (NEEDLE) IMPLANT
NEEDLE HYPO 25GX1X1/2 BEV (NEEDLE) IMPLANT
PACK EXTREMITY ARMC (MISCELLANEOUS) IMPLANT
PACK KNEE ARTHROSCOPY (PACKS) ×2 IMPLANT
PENCIL ELECTRO HAND CTR (MISCELLANEOUS) ×2 IMPLANT
SPLINT CAST 1 STEP 4X30 (MISCELLANEOUS) ×2 IMPLANT
STOCKINETTE STRL 6IN 960660 (GAUZE/BANDAGES/DRESSINGS) ×2 IMPLANT
STRAP ANKLE DISTRACTOR (MISCELLANEOUS) IMPLANT
STRAP ANKLE FOOT DISTRACTOR (ORTHOPEDIC SUPPLIES) IMPLANT
STRAP BODY AND KNEE 60X3 (MISCELLANEOUS) ×2 IMPLANT
SUT ETHIBOND GREEN BRAID 0S 4 (SUTURE) IMPLANT
SUT ETHILON 3-0 FS-10 30 BLK (SUTURE) ×4
SUT ETHILON 4-0 (SUTURE)
SUT ETHILON 4-0 FS2 18XMFL BLK (SUTURE)
SUT PDS 2-0 27IN (SUTURE) ×2 IMPLANT
SUT VIC AB 2-0 SH 27 (SUTURE) ×1
SUT VIC AB 2-0 SH 27XBRD (SUTURE) ×1 IMPLANT
SUT VIC AB 3-0 SH 27 (SUTURE)
SUT VIC AB 3-0 SH 27X BRD (SUTURE) IMPLANT
SUT VIC AB 4-0 FS2 27 (SUTURE) ×4 IMPLANT
SUT VIC AB 4-0 SH 27 (SUTURE)
SUT VIC AB 4-0 SH 27XANBCTRL (SUTURE) IMPLANT
SUTURE EHLN 3-0 FS-10 30 BLK (SUTURE) ×2 IMPLANT
SUTURE ETHLN 4-0 FS2 18XMF BLK (SUTURE) IMPLANT
TUBING ARTHRO INFLOW-ONLY STRL (TUBING) ×2 IMPLANT
TUBING ARTHROSCOPY INFLOW/OUT (IRRIGATION / IRRIGATOR) ×2 IMPLANT
WAND ARTHRO PARAGON T2 (SURGICAL WAND) ×1 IMPLANT
WAND TOPAZ MICRO DEBRIDER (MISCELLANEOUS) ×2 IMPLANT

## 2016-03-22 NOTE — Op Note (Signed)
Operative note   Surgeon:Siearra Amberg Lawyer: None    Preop diagnosis: 1. Left ankle peroneal tendon rupture 2. Left ankle anterior ankle impingement syndrome    Postop diagnosis: 1. Longitudinal rupture with marketed degeneration greater than 50% of progress longus tendon left ankle 2. left peroneal longus longus synovitis 3. anterior ankle impingement syndrome     Procedure:1. Left ankle arthroscopy with extensive debridement 2. Left peroneal brevis tendon repair with anastomosis to progress longus 3. Left peroneal longus tenosynovectomy    EBL: Minimal    Anesthesia:regional and general    Hemostasis: Thigh tourniquet inflated to 250 mmHg for 101 minutes    Specimen: Peroneal tendon tear    Complications: None    Operative indications:Tammy Lamb is an 65 y.o. that presents today for surgical intervention.  The risks/benefits/alternatives/complications have been discussed and consent has been given.    Procedure:  Patient was brought into the OR and placed on the operating table in thesupine position. After anesthesia was obtained theleft lower extremity was prepped and draped in usual sterile fashion.  After inflation of the tourniquet the left anterior medial and anterolateral ankle joint arthroscopy portals were made. The joint was infiltrated with 5 cc of 1% lidocaine with epinephrine. Skin incision was made and blunt dissection carried down to the capsule. The anteromedial portal was initially entered. The ankle joint was evaluated. There was noted to be some mild-to-moderate synovitis on the anterior aspect of the ankle joint. There was a small area of degenerative bone on the anterior aspect of the ankle joint superior to the cartilaginous rim. The small shaver was initially entered and all synovitis and degenerative tissue was excised. The anterior aspect of the distal tibia was debrided with the shaver. Residual visualization of the ankle joint did not reveal any  obvious osteochondral defects. After extensive debridement was performed the anteromedial and anterolateral portals were closed with a 3-0 nylon.  Attention was then directed to the lateral aspect of the ankle where incision was made curvilinear along the peroneal tendon from approximate 5 cm proximal to the distal fibula to the insertion on the base of the fifth metatarsal. Sharp and blunt dissection was carried down to the sheath. The sheath and peroneal retinaculum was then excised at this time. The progress brevis tendon was initially visualized. There was a large bulbous tendinous tear at the level of the distal fibula. There was marked fraying of the residual tendon remaining. Marketed mucoid degeneration was noted with fraying throughout the tendon. Greater than 75% of the tendon was affected at this time. The peroneal longus tendon was evaluated with mild-to-moderate synovitis.  The muscle belly of the peroneal brevis was low-lying and I was able to excise this proximally. After removal of the synovitis of the progress longus tendon I felt her best option was to have the large portion of the severely damaged peroneal brevis tendon excised and anastomosis of the proximal and distal stump to the progress longus tendon. Initially the proximal and distal aspect of healthy tendon was then sutured with a 2-0 PDS to the peroneal longus tendon. Next a side-to-side anastomosis was then completed with a combination of 4-0 Vicryl and 2-0 PDS. The central portion of the severely frayed peroneal brevis tendon was then excised. The ends of the excised progress brevis times were then sutured into the peroneal longus tendon with 2-0 PDS and 2-0 Vicryl. The foot was held in a neutral position during the anastomosis process. The wound was  flushed with copious amounts or irrigation. The sheath was reanastomosed with a 4-0 Vicryl and the peroneal retinaculum reanastomosed with a 2-0 Vicryl in a pants over vest fashion.  Subcutaneous tissue was anastomosed with a 4-0 Vicryl and the skin with a 3-0 nylon. 0.25% Marcaine was placed around all areas. Patient was then placed in a posterior splint with the foot in a neutral position.   Patient tolerated the procedure and anesthesia well.  Was transported from the OR to the PACU with all vital signs stable and vascular status intact. To be discharged per routine protocol.  Will follow up in approximately 1 week in the outpatient clinic.

## 2016-03-22 NOTE — Progress Notes (Signed)
Assisted Tammy Lamb ANMD with popliteal block. Side rails up, monitors on throughout procedure. See vital signs in flow sheet. Tolerated Procedure well.

## 2016-03-22 NOTE — Anesthesia Postprocedure Evaluation (Signed)
Anesthesia Post Note  Patient: Tammy Lamb  Procedure(s) Performed: Procedure(s) (LRB): ANKLE ARTHROSCOPY DEBRODEMENT EXTENSIVE LEFT FLEXAR TENDON REPAIR (Left) FLEXAR TENDON REPAIR (Left)  Patient location during evaluation: PACU Anesthesia Type: General Level of consciousness: awake and alert Pain management: pain level controlled Vital Signs Assessment: post-procedure vital signs reviewed and stable Respiratory status: spontaneous breathing, nonlabored ventilation, respiratory function stable and patient connected to nasal cannula oxygen Cardiovascular status: blood pressure returned to baseline and stable Postop Assessment: no signs of nausea or vomiting Anesthetic complications: no    Falicia Lizotte

## 2016-03-22 NOTE — Transfer of Care (Signed)
Immediate Anesthesia Transfer of Care Note  Patient: Tammy Lamb  Procedure(s) Performed: Procedure(s) with comments: ANKLE ARTHROSCOPY DEBRODEMENT EXTENSIVE LEFT FLEXAR TENDON REPAIR (Left) - WITH POPLITEAL FLEXAR TENDON REPAIR (Left)  Patient Location: PACU  Anesthesia Type: General, Regional  Level of Consciousness: awake, alert  and patient cooperative  Airway and Oxygen Therapy: Patient Spontanous Breathing and Patient connected to supplemental oxygen  Post-op Assessment: Post-op Vital signs reviewed, Patient's Cardiovascular Status Stable, Respiratory Function Stable, Patent Airway and No signs of Nausea or vomiting  Post-op Vital Signs: Reviewed and stable  Complications: No apparent anesthesia complications

## 2016-03-22 NOTE — H&P (Signed)
HISTORY AND PHYSICAL INTERVAL NOTE:  03/22/2016  11:54 AM  Tammy Lamb  has presented today for surgery, with the diagnosis of S86.312S PERONEAL TENDON RUPTURE M65.9.  The various methods of treatment have been discussed with the patient.  No guarantees were given.  After consideration of risks, benefits and other options for treatment, the patient has consented to surgery.  I have reviewed the patients' chart and labs.    Patient Vitals for the past 24 hrs:  BP Temp Temp src Pulse Resp SpO2 Height Weight  03/22/16 1140 (!) 141/95 - - 62 - - - -  03/22/16 1130 (!) 157/117 - - - 12 - - -  03/22/16 1122 (!) 167/108 - - 62 20 99 % - -  03/22/16 1051 (!) 148/93 97 F (36.1 C) Temporal 63 16 98 % 5' 6"  (1.676 m) 103 kg (227 lb)    A history and physical examination was performed in my office.  The patient was reexamined.  There have been no changes to this history and physical examination.  Plan for ankle arthroscopy and peroneal tendon repair.  Tammy Lamb A

## 2016-03-22 NOTE — Anesthesia Preprocedure Evaluation (Signed)
Anesthesia Evaluation  Patient identified by MRN, date of birth, ID band Patient awake    Reviewed: Allergy & Precautions, H&P , NPO status , Patient's Chart, lab work & pertinent test results, reviewed documented beta blocker date and time   History of Anesthesia Complications Negative for: history of anesthetic complications  Airway Mallampati: II  TM Distance: >3 FB Neck ROM: full    Dental  (+) Partial Upper, Partial Lower   Pulmonary COPD (bronchitis ?), Current Smoker,    Pulmonary exam normal breath sounds clear to auscultation       Cardiovascular Exercise Tolerance: Good hypertension, Normal cardiovascular exam Rhythm:Regular Rate:Normal     Neuro/Psych  Headaches, Anxiety TIA (10 yrs ago ?)   GI/Hepatic Neg liver ROS, GERD  Controlled,ulc colitis;   Endo/Other  Morbid obesity (bmi=37)  Renal/GU negative Renal ROS  negative genitourinary   Musculoskeletal  (+) Arthritis ,   Abdominal (+) + obese,   Peds  Hematology negative hematology ROS (+)   Anesthesia Other Findings Med stable: 02/2016: dr. Holley Raring;   Reproductive/Obstetrics                             Anesthesia Physical Anesthesia Plan  ASA: II  Anesthesia Plan: General and Regional   Post-op Pain Management:    Induction:   Airway Management Planned:   Additional Equipment:   Intra-op Plan:   Post-operative Plan:   Informed Consent: I have reviewed the patients History and Physical, chart, labs and discussed the procedure including the risks, benefits and alternatives for the proposed anesthesia with the patient or authorized representative who has indicated his/her understanding and acceptance.     Plan Discussed with: CRNA  Anesthesia Plan Comments: (Popliteal block)        Anesthesia Quick Evaluation

## 2016-03-22 NOTE — Anesthesia Procedure Notes (Signed)
Anesthesia Regional Block:  Popliteal block  Pre-Anesthetic Checklist: ,, timeout performed, Correct Patient, Correct Site, Correct Laterality, Correct Procedure, Correct Position, site marked, Risks and benefits discussed,  Surgical consent,  Pre-op evaluation,  At surgeon's request and post-op pain management  Laterality: Left  Prep: chloraprep       Needles:  Injection technique: Single-shot  Needle Type: Stimulator Needle - 40     Needle Length: 9cm 9 cm Needle Gauge: 21 and 21 G    Additional Needles:  Procedures: ultrasound guided (picture in chart) and nerve stimulator Popliteal block  Nerve Stimulator or Paresthesia:  Response: ankle twitch, 0.5 mA,   Additional Responses:   Narrative:  Start time: 03/22/2016 11:25 AM End time: 03/22/2016 11:33 AM Injection made incrementally with aspirations every 5 mL.  Performed by: Personally  Anesthesiologist: Fidel Levy  Additional Notes: Functioning IV was confirmed and monitors applied. Ultrasound guidance: relevant anatomy identified, needle position confirmed, local anesthetic spread visualized around nerve(s)., vascular puncture avoided.  Image printed for medical record.  Negative aspiration and no paresthesias; incremental administration of local anesthetic. The patient tolerated the procedure well. Vitals signes recorded in RN notes.

## 2016-03-22 NOTE — Anesthesia Procedure Notes (Signed)
Procedure Name: LMA Insertion Date/Time: 03/22/2016 12:20 PM Performed by: Londell Moh Pre-anesthesia Checklist: Patient identified, Emergency Drugs available, Suction available, Timeout performed and Patient being monitored Patient Re-evaluated:Patient Re-evaluated prior to inductionOxygen Delivery Method: Circle system utilized Preoxygenation: Pre-oxygenation with 100% oxygen Intubation Type: IV induction LMA: LMA inserted LMA Size: 4.0 Number of attempts: 1 Placement Confirmation: positive ETCO2 and breath sounds checked- equal and bilateral Tube secured with: Tape

## 2016-03-23 ENCOUNTER — Encounter: Payer: Self-pay | Admitting: Podiatry

## 2016-03-24 LAB — SURGICAL PATHOLOGY

## 2017-01-31 ENCOUNTER — Other Ambulatory Visit: Payer: Self-pay | Admitting: Orthopedic Surgery

## 2017-01-31 DIAGNOSIS — M51369 Other intervertebral disc degeneration, lumbar region without mention of lumbar back pain or lower extremity pain: Secondary | ICD-10-CM

## 2017-01-31 DIAGNOSIS — M47816 Spondylosis without myelopathy or radiculopathy, lumbar region: Secondary | ICD-10-CM

## 2017-01-31 DIAGNOSIS — M4316 Spondylolisthesis, lumbar region: Secondary | ICD-10-CM

## 2017-01-31 DIAGNOSIS — M5136 Other intervertebral disc degeneration, lumbar region: Secondary | ICD-10-CM

## 2017-02-06 ENCOUNTER — Ambulatory Visit
Admission: RE | Admit: 2017-02-06 | Discharge: 2017-02-06 | Disposition: A | Discharge status: Home / Self Care | Payer: Medicare Other | Source: Ambulatory Visit | Attending: Orthopedic Surgery | Admitting: Orthopedic Surgery

## 2017-02-06 DIAGNOSIS — M48061 Spinal stenosis, lumbar region without neurogenic claudication: Secondary | ICD-10-CM | POA: Insufficient documentation

## 2017-02-06 DIAGNOSIS — M47816 Spondylosis without myelopathy or radiculopathy, lumbar region: Secondary | ICD-10-CM | POA: Insufficient documentation

## 2017-02-06 DIAGNOSIS — M1288 Other specific arthropathies, not elsewhere classified, other specified site: Secondary | ICD-10-CM | POA: Diagnosis not present

## 2017-02-06 DIAGNOSIS — M4696 Unspecified inflammatory spondylopathy, lumbar region: Secondary | ICD-10-CM | POA: Diagnosis not present

## 2017-02-06 DIAGNOSIS — M4316 Spondylolisthesis, lumbar region: Secondary | ICD-10-CM | POA: Insufficient documentation

## 2017-02-06 DIAGNOSIS — M5136 Other intervertebral disc degeneration, lumbar region: Secondary | ICD-10-CM | POA: Diagnosis present

## 2017-02-06 DIAGNOSIS — M5126 Other intervertebral disc displacement, lumbar region: Secondary | ICD-10-CM | POA: Diagnosis not present

## 2017-02-09 ENCOUNTER — Other Ambulatory Visit: Payer: Self-pay | Admitting: Orthopedic Surgery

## 2017-02-09 DIAGNOSIS — M48061 Spinal stenosis, lumbar region without neurogenic claudication: Secondary | ICD-10-CM

## 2017-02-20 ENCOUNTER — Ambulatory Visit
Admission: RE | Admit: 2017-02-20 | Discharge: 2017-02-20 | Disposition: A | Discharge status: Home / Self Care | Payer: Medicare Other | Source: Ambulatory Visit | Attending: Orthopedic Surgery | Admitting: Orthopedic Surgery

## 2017-02-20 DIAGNOSIS — M48061 Spinal stenosis, lumbar region without neurogenic claudication: Secondary | ICD-10-CM

## 2017-02-20 MED ORDER — IOPAMIDOL (ISOVUE-M 200) INJECTION 41%
1.0000 mL | Freq: Once | INTRAMUSCULAR | Status: AC
  Administered 2017-02-20: 1 mL via EPIDURAL

## 2017-02-20 MED ORDER — METHYLPREDNISOLONE ACETATE 40 MG/ML INJ SUSP (RADIOLOG
120.0000 mg | Freq: Once | INTRAMUSCULAR | Status: AC
  Administered 2017-02-20: 120 mg via EPIDURAL

## 2017-02-20 NOTE — Discharge Instructions (Signed)

## 2017-05-28 ENCOUNTER — Encounter
Admission: RE | Admit: 2017-05-28 | Discharge: 2017-05-28 | Disposition: A | Discharge status: Home / Self Care | Payer: Medicare Other | Source: Ambulatory Visit | Attending: Neurosurgery | Admitting: Neurosurgery

## 2017-05-28 DIAGNOSIS — F419 Anxiety disorder, unspecified: Secondary | ICD-10-CM | POA: Diagnosis not present

## 2017-05-28 DIAGNOSIS — Z9889 Other specified postprocedural states: Secondary | ICD-10-CM | POA: Diagnosis not present

## 2017-05-28 DIAGNOSIS — F17200 Nicotine dependence, unspecified, uncomplicated: Secondary | ICD-10-CM | POA: Diagnosis not present

## 2017-05-28 DIAGNOSIS — Z90710 Acquired absence of both cervix and uterus: Secondary | ICD-10-CM | POA: Diagnosis not present

## 2017-05-28 DIAGNOSIS — K219 Gastro-esophageal reflux disease without esophagitis: Secondary | ICD-10-CM | POA: Diagnosis not present

## 2017-05-28 DIAGNOSIS — F329 Major depressive disorder, single episode, unspecified: Secondary | ICD-10-CM | POA: Diagnosis not present

## 2017-05-28 DIAGNOSIS — J449 Chronic obstructive pulmonary disease, unspecified: Secondary | ICD-10-CM | POA: Diagnosis not present

## 2017-05-28 DIAGNOSIS — Z79899 Other long term (current) drug therapy: Secondary | ICD-10-CM | POA: Diagnosis not present

## 2017-05-28 DIAGNOSIS — Z8711 Personal history of peptic ulcer disease: Secondary | ICD-10-CM | POA: Diagnosis not present

## 2017-05-28 DIAGNOSIS — Z8673 Personal history of transient ischemic attack (TIA), and cerebral infarction without residual deficits: Secondary | ICD-10-CM | POA: Diagnosis not present

## 2017-05-28 DIAGNOSIS — I10 Essential (primary) hypertension: Secondary | ICD-10-CM | POA: Diagnosis not present

## 2017-05-28 DIAGNOSIS — M48062 Spinal stenosis, lumbar region with neurogenic claudication: Secondary | ICD-10-CM | POA: Diagnosis present

## 2017-05-28 HISTORY — DX: Depression, unspecified: F32.A

## 2017-05-28 HISTORY — DX: Major depressive disorder, single episode, unspecified: F32.9

## 2017-05-28 HISTORY — DX: Dorsalgia, unspecified: M54.9

## 2017-05-28 LAB — URINALYSIS, COMPLETE (UACMP) WITH MICROSCOPIC
Bacteria, UA: NONE SEEN
Bilirubin Urine: NEGATIVE
Glucose, UA: NEGATIVE mg/dL
HGB URINE DIPSTICK: NEGATIVE
Ketones, ur: NEGATIVE mg/dL
Leukocytes, UA: NEGATIVE
Nitrite: NEGATIVE
PROTEIN: NEGATIVE mg/dL
SPECIFIC GRAVITY, URINE: 1.005 (ref 1.005–1.030)
pH: 8 (ref 5.0–8.0)

## 2017-05-28 LAB — TYPE AND SCREEN
ABO/RH(D): O POS
ANTIBODY SCREEN: NEGATIVE

## 2017-05-28 LAB — CBC
HEMATOCRIT: 37.1 % (ref 35.0–47.0)
HEMOGLOBIN: 12.4 g/dL (ref 12.0–16.0)
MCH: 28.9 pg (ref 26.0–34.0)
MCHC: 33.3 g/dL (ref 32.0–36.0)
MCV: 86.7 fL (ref 80.0–100.0)
Platelets: 394 10*3/uL (ref 150–440)
RBC: 4.28 MIL/uL (ref 3.80–5.20)
RDW: 14.6 % — ABNORMAL HIGH (ref 11.5–14.5)
WBC: 6.1 10*3/uL (ref 3.6–11.0)

## 2017-05-28 LAB — BASIC METABOLIC PANEL
Anion gap: 10 (ref 5–15)
BUN: 13 mg/dL (ref 6–20)
CHLORIDE: 96 mmol/L — AB (ref 101–111)
CO2: 28 mmol/L (ref 22–32)
Calcium: 9.3 mg/dL (ref 8.9–10.3)
Creatinine, Ser: 0.78 mg/dL (ref 0.44–1.00)
GFR calc Af Amer: >60 mL/min (ref >60)
GLUCOSE: 106 mg/dL — AB (ref 65–99)
POTASSIUM: 3.8 mmol/L (ref 3.5–5.1)
SODIUM: 134 mmol/L — AB (ref 135–145)

## 2017-05-28 LAB — SURGICAL PCR SCREEN
MRSA, PCR: NEGATIVE
Staphylococcus aureus: NEGATIVE

## 2017-05-28 LAB — APTT: aPTT: 31 seconds (ref 24–36)

## 2017-05-28 LAB — PROTIME-INR
INR: 1.06
PROTHROMBIN TIME: 13.7 s (ref 11.4–15.2)

## 2017-05-28 NOTE — Patient Instructions (Signed)
Your procedure is scheduled on: 05/30/17 Wed Report to Same Day Surgery 2nd floor medical mall [facility] Entrance-take elevator on left to 2nd floor.  Check in with surgery information desk.) To find out your arrival time please call [phone] between 1PM - 3PM on 05/29/17 Tues  Remember: Instructions that are not followed completely may result in serious medical risk, up to and including death, or upon the discretion of your surgeon and anesthesiologist your surgery may need to be rescheduled.    _x___ 1. Do not eat food after midnight the night before your procedure. You may drink clear liquids up to 2 hours before you are scheduled to arrive at the hospital for your procedure.  Do not drink clear liquids within 2 hours of your scheduled arrival to the hospital.  Clear liquids include  --Water or Apple juice without pulp  --Clear carbohydrate beverage such as ClearFast or Gatorade  --Black Coffee or Clear Tea (No milk, no creamers, do not add anything to                  the coffee or Tea Type 1 and type 2 diabetics should only drink water.  No gum chewing or hard candies.     __x__ 2. No Alcohol for 24 hours before or after surgery.   __x__3. No Smoking for 24 prior to surgery.   ____  4. Bring all medications with you on the day of surgery if instructed.    __x__ 5. Notify your doctor if there is any change in your medical condition     (cold, fever, infections).     Do not wear jewelry, make-up, hairpins, clips or nail polish.  Do not wear lotions, powders, or perfumes. You may wear deodorant.  Do not shave 48 hours prior to surgery. Men may shave face and neck.  Do not bring valuables to the hospital.    [facility] is not responsible for any belongings or valuables.               Contacts, dentures or bridgework may not be worn into surgery.  Leave your suitcase in the car. After surgery it may be brought to your room.  For patients admitted to the hospital, discharge  time is determined by your                       treatment team.   Patients discharged the day of surgery will not be allowed to drive home.  You will need someone to drive you home and stay with you the night of your procedure.    Please read over the following fact sheets that you were given:   [facility] Preparing for Surgery and or MRSA Information   _x___ Take anti-hypertensive listed below, cardiac, seizure, asthma,     anti-reflux and psychiatric medicines. These include:  1. carvedilol (COREG) 25 MG tablet  2.  3.  4.  5.  6.  ____Fleets enema or Magnesium Citrate as directed.   _x___ Use CHG Soap or sage wipes as directed on instruction sheet   ____ Use inhalers on the day of surgery and bring to hospital day of surgery  ____ Stop Metformin and Janumet 2 days prior to surgery.    ____ Take 1/2 of usual insulin dose the night before surgery and none on the morning     surgery.   _x___ Follow recommendations from Cardiologist, Pulmonologist or PCP regarding  stopping Aspirin, Coumadin, Plavix ,Eliquis, Effient, or Pradaxa, and Pletal.  X____Stop Anti-inflammatories such as Advil, Aleve, Ibuprofen, Motrin, Naproxen, Naprosyn, Goodies powders or aspirin products. OK to take Tylenol and                          Celebrex.   _x___ Stop supplements until after surgery.  But may continue Vitamin D, Vitamin B,       and multivitamin.   ____ Bring C-Pap to the hospital.

## 2017-05-30 ENCOUNTER — Encounter: Payer: Self-pay | Admitting: *Deleted

## 2017-05-30 ENCOUNTER — Encounter
Admission: RE | Disposition: A | Discharge status: Home / Self Care | Payer: Self-pay | Source: Ambulatory Visit | Attending: Neurosurgery

## 2017-05-30 ENCOUNTER — Ambulatory Visit: Payer: Medicare Other

## 2017-05-30 ENCOUNTER — Observation Stay
Admission: RE | Admit: 2017-05-30 | Discharge: 2017-05-31 | Disposition: A | Discharge status: Home / Self Care | Payer: Medicare Other | Source: Ambulatory Visit | Attending: Neurosurgery | Admitting: Neurosurgery

## 2017-05-30 ENCOUNTER — Ambulatory Visit: Payer: Medicare Other | Admitting: Anesthesiology

## 2017-05-30 ENCOUNTER — Other Ambulatory Visit: Payer: Self-pay

## 2017-05-30 DIAGNOSIS — K219 Gastro-esophageal reflux disease without esophagitis: Secondary | ICD-10-CM | POA: Insufficient documentation

## 2017-05-30 DIAGNOSIS — M48062 Spinal stenosis, lumbar region with neurogenic claudication: Secondary | ICD-10-CM | POA: Diagnosis not present

## 2017-05-30 DIAGNOSIS — Z79899 Other long term (current) drug therapy: Secondary | ICD-10-CM | POA: Insufficient documentation

## 2017-05-30 DIAGNOSIS — Z8673 Personal history of transient ischemic attack (TIA), and cerebral infarction without residual deficits: Secondary | ICD-10-CM | POA: Insufficient documentation

## 2017-05-30 DIAGNOSIS — Z8711 Personal history of peptic ulcer disease: Secondary | ICD-10-CM | POA: Insufficient documentation

## 2017-05-30 DIAGNOSIS — I10 Essential (primary) hypertension: Secondary | ICD-10-CM | POA: Insufficient documentation

## 2017-05-30 DIAGNOSIS — F329 Major depressive disorder, single episode, unspecified: Secondary | ICD-10-CM | POA: Insufficient documentation

## 2017-05-30 DIAGNOSIS — R29818 Other symptoms and signs involving the nervous system: Secondary | ICD-10-CM | POA: Diagnosis present

## 2017-05-30 DIAGNOSIS — F419 Anxiety disorder, unspecified: Secondary | ICD-10-CM | POA: Insufficient documentation

## 2017-05-30 DIAGNOSIS — F17200 Nicotine dependence, unspecified, uncomplicated: Secondary | ICD-10-CM | POA: Insufficient documentation

## 2017-05-30 DIAGNOSIS — Z9889 Other specified postprocedural states: Secondary | ICD-10-CM | POA: Insufficient documentation

## 2017-05-30 DIAGNOSIS — G9519 Other vascular myelopathies: Secondary | ICD-10-CM | POA: Diagnosis present

## 2017-05-30 DIAGNOSIS — Z90710 Acquired absence of both cervix and uterus: Secondary | ICD-10-CM | POA: Insufficient documentation

## 2017-05-30 DIAGNOSIS — Z419 Encounter for procedure for purposes other than remedying health state, unspecified: Secondary | ICD-10-CM

## 2017-05-30 DIAGNOSIS — J449 Chronic obstructive pulmonary disease, unspecified: Secondary | ICD-10-CM | POA: Insufficient documentation

## 2017-05-30 HISTORY — DX: Anxiety disorder, unspecified: F41.9

## 2017-05-30 HISTORY — PX: LUMBAR LAMINECTOMY/DECOMPRESSION MICRODISCECTOMY: SHX5026

## 2017-05-30 HISTORY — DX: Cerebral infarction, unspecified: I63.9

## 2017-05-30 LAB — ABO/RH: ABO/RH(D): O POS

## 2017-05-30 SURGERY — LUMBAR LAMINECTOMY/DECOMPRESSION MICRODISCECTOMY 3 LEVELS
Anesthesia: Choice

## 2017-05-30 MED ORDER — DEXMEDETOMIDINE HCL 200 MCG/2ML IV SOLN
INTRAVENOUS | Status: DC | PRN
  Administered 2017-05-30: 8 ug via INTRAVENOUS

## 2017-05-30 MED ORDER — SODIUM CHLORIDE 0.9 % IJ SOLN
INTRAMUSCULAR | Status: AC
  Filled 2017-05-30: qty 50

## 2017-05-30 MED ORDER — ACETAMINOPHEN 325 MG PO TABS: 650.0000 mg | ORAL_TABLET | ORAL | Status: DC | PRN

## 2017-05-30 MED ORDER — KETAMINE HCL 50 MG/ML IJ SOLN
INTRAMUSCULAR | Status: AC
  Filled 2017-05-30: qty 10

## 2017-05-30 MED ORDER — BUPIVACAINE HCL (PF) 0.5 % IJ SOLN
INTRAMUSCULAR | Status: AC
  Filled 2017-05-30: qty 30

## 2017-05-30 MED ORDER — ROCURONIUM BROMIDE 100 MG/10ML IV SOLN
INTRAVENOUS | Status: DC | PRN
  Administered 2017-05-30: 10 mg via INTRAVENOUS
  Administered 2017-05-30: 20 mg via INTRAVENOUS
  Administered 2017-05-30: 50 mg via INTRAVENOUS

## 2017-05-30 MED ORDER — PHENYLEPHRINE HCL 10 MG/ML IJ SOLN
INTRAMUSCULAR | Status: DC | PRN
  Administered 2017-05-30 (×2): 100 ug via INTRAVENOUS

## 2017-05-30 MED ORDER — CARVEDILOL 12.5 MG PO TABS
12.5000 mg | ORAL_TABLET | Freq: Two times a day (BID) | ORAL | Status: DC
  Administered 2017-05-30 – 2017-05-31 (×2): 12.5 mg via ORAL
  Filled 2017-05-30 (×2): qty 1

## 2017-05-30 MED ORDER — ACETAMINOPHEN 500 MG PO TABS
1000.0000 mg | ORAL_TABLET | Freq: Four times a day (QID) | ORAL | Status: DC
  Administered 2017-05-30 – 2017-05-31 (×3): 1000 mg via ORAL
  Filled 2017-05-30 (×3): qty 2

## 2017-05-30 MED ORDER — ONDANSETRON HCL 4 MG PO TABS: 4.0000 mg | ORAL_TABLET | Freq: Four times a day (QID) | ORAL | Status: DC | PRN

## 2017-05-30 MED ORDER — PANTOPRAZOLE SODIUM 40 MG PO TBEC
40.0000 mg | DELAYED_RELEASE_TABLET | Freq: Every day | ORAL | Status: DC
  Administered 2017-05-31: 40 mg via ORAL
  Filled 2017-05-30: qty 1

## 2017-05-30 MED ORDER — DEXTROSE 5 % IV SOLN
3.0000 g | Freq: Once | INTRAVENOUS | Status: AC
  Administered 2017-05-30: 3 g via INTRAVENOUS
  Filled 2017-05-30: qty 3

## 2017-05-30 MED ORDER — ONDANSETRON HCL 4 MG/2ML IJ SOLN
INTRAMUSCULAR | Status: AC
Start: 2017-05-30 — End: 2017-05-30
  Filled 2017-05-30: qty 2

## 2017-05-30 MED ORDER — ACETAMINOPHEN 650 MG RE SUPP: 650.0000 mg | RECTAL | Status: DC | PRN

## 2017-05-30 MED ORDER — GELATIN ABSORBABLE 12-7 MM EX MISC
CUTANEOUS | Status: DC | PRN
  Administered 2017-05-30: 1

## 2017-05-30 MED ORDER — PROPOFOL 10 MG/ML IV BOLUS
INTRAVENOUS | Status: DC | PRN
  Administered 2017-05-30: 130 mg via INTRAVENOUS

## 2017-05-30 MED ORDER — RAMIPRIL 10 MG PO CAPS
10.0000 mg | ORAL_CAPSULE | Freq: Every day | ORAL | Status: DC
  Administered 2017-05-30 – 2017-05-31 (×2): 10 mg via ORAL
  Filled 2017-05-30 (×2): qty 1

## 2017-05-30 MED ORDER — MIDAZOLAM HCL 2 MG/2ML IJ SOLN
INTRAMUSCULAR | Status: DC | PRN
  Administered 2017-05-30: 2 mg via INTRAVENOUS

## 2017-05-30 MED ORDER — FENTANYL CITRATE (PF) 100 MCG/2ML IJ SOLN
INTRAMUSCULAR | Status: AC
  Administered 2017-05-30: 25 ug via INTRAVENOUS
  Filled 2017-05-30: qty 2

## 2017-05-30 MED ORDER — GELATIN ABSORBABLE 12-7 MM EX MISC
CUTANEOUS | Status: AC
  Filled 2017-05-30: qty 1

## 2017-05-30 MED ORDER — CITALOPRAM HYDROBROMIDE 20 MG PO TABS: 20.0000 mg | ORAL_TABLET | Freq: Every evening | ORAL | Status: DC

## 2017-05-30 MED ORDER — ACETAMINOPHEN 10 MG/ML IV SOLN
INTRAVENOUS | Status: AC
  Filled 2017-05-30: qty 100

## 2017-05-30 MED ORDER — BUPIVACAINE-EPINEPHRINE (PF) 0.5% -1:200000 IJ SOLN
INTRAMUSCULAR | Status: DC | PRN
  Administered 2017-05-30: 10 mL

## 2017-05-30 MED ORDER — LIDOCAINE HCL (CARDIAC) 20 MG/ML IV SOLN
INTRAVENOUS | Status: DC | PRN
  Administered 2017-05-30: 100 mg via INTRAVENOUS

## 2017-05-30 MED ORDER — DEXAMETHASONE SODIUM PHOSPHATE 10 MG/ML IJ SOLN
INTRAMUSCULAR | Status: AC
  Filled 2017-05-30: qty 1

## 2017-05-30 MED ORDER — BUPIVACAINE LIPOSOME 1.3 % IJ SUSP
INTRAMUSCULAR | Status: AC
  Filled 2017-05-30: qty 20

## 2017-05-30 MED ORDER — BACITRACIN 50000 UNITS IM SOLR
INTRAMUSCULAR | Status: DC | PRN
  Administered 2017-05-30: 50000 [IU]

## 2017-05-30 MED ORDER — DEXAMETHASONE SODIUM PHOSPHATE 10 MG/ML IJ SOLN
INTRAMUSCULAR | Status: DC | PRN
  Administered 2017-05-30: 10 mg via INTRAVENOUS

## 2017-05-30 MED ORDER — SULFASALAZINE 500 MG PO TABS
500.0000 mg | ORAL_TABLET | Freq: Two times a day (BID) | ORAL | Status: DC
  Administered 2017-05-30 – 2017-05-31 (×2): 500 mg via ORAL
  Filled 2017-05-30 (×2): qty 1

## 2017-05-30 MED ORDER — FENTANYL CITRATE (PF) 100 MCG/2ML IJ SOLN
INTRAMUSCULAR | Status: DC | PRN
  Administered 2017-05-30 (×2): 50 ug via INTRAVENOUS
  Administered 2017-05-30: 100 ug via INTRAVENOUS
  Administered 2017-05-30 (×2): 50 ug via INTRAVENOUS

## 2017-05-30 MED ORDER — DEXTROSE 5 % IV SOLN
500.0000 mg | Freq: Four times a day (QID) | INTRAVENOUS | Status: DC | PRN
  Filled 2017-05-30: qty 5

## 2017-05-30 MED ORDER — FENTANYL CITRATE (PF) 100 MCG/2ML IJ SOLN
INTRAMUSCULAR | Status: AC
  Filled 2017-05-30: qty 2

## 2017-05-30 MED ORDER — BUPIVACAINE HCL (PF) 0.5 % IJ SOLN
INTRAMUSCULAR | Status: DC | PRN
  Administered 2017-05-30: 20 mL

## 2017-05-30 MED ORDER — SODIUM CHLORIDE 0.9 % IV SOLN: 250.0000 mL | INTRAVENOUS | Status: DC

## 2017-05-30 MED ORDER — THROMBIN (RECOMBINANT) 5000 UNITS EX SOLR
CUTANEOUS | Status: DC | PRN
  Administered 2017-05-30: 5000 [IU] via TOPICAL

## 2017-05-30 MED ORDER — ROCURONIUM BROMIDE 50 MG/5ML IV SOLN
INTRAVENOUS | Status: AC
Start: 2017-05-30 — End: 2017-05-30
  Filled 2017-05-30: qty 1

## 2017-05-30 MED ORDER — PHENYLEPHRINE HCL 1 % NA SOLN
1.0000 [drp] | Freq: Every day | NASAL | Status: DC | PRN
  Filled 2017-05-30: qty 15

## 2017-05-30 MED ORDER — THROMBIN (RECOMBINANT) 5000 UNITS EX SOLR
CUTANEOUS | Status: AC
  Filled 2017-05-30: qty 10000

## 2017-05-30 MED ORDER — SUGAMMADEX SODIUM 200 MG/2ML IV SOLN
INTRAVENOUS | Status: AC
  Filled 2017-05-30: qty 2

## 2017-05-30 MED ORDER — KETAMINE HCL 10 MG/ML IJ SOLN
INTRAMUSCULAR | Status: DC | PRN
  Administered 2017-05-30: 50 mg via INTRAVENOUS

## 2017-05-30 MED ORDER — METHYLPREDNISOLONE ACETATE 40 MG/ML IJ SUSP
INTRAMUSCULAR | Status: AC
Start: 2017-05-30 — End: 2017-05-30
  Filled 2017-05-30: qty 1

## 2017-05-30 MED ORDER — ONDANSETRON HCL 4 MG/2ML IJ SOLN
4.0000 mg | Freq: Four times a day (QID) | INTRAMUSCULAR | Status: DC | PRN
  Administered 2017-05-31: 4 mg via INTRAVENOUS
  Filled 2017-05-30: qty 2

## 2017-05-30 MED ORDER — SODIUM CHLORIDE 0.9% FLUSH: 3.0000 mL | INTRAVENOUS | Status: DC | PRN

## 2017-05-30 MED ORDER — MIDAZOLAM HCL 2 MG/2ML IJ SOLN
INTRAMUSCULAR | Status: AC
Start: 2017-05-30 — End: 2017-05-30
  Filled 2017-05-30: qty 2

## 2017-05-30 MED ORDER — SODIUM CHLORIDE 0.9 % IJ SOLN
INTRAMUSCULAR | Status: DC | PRN
  Administered 2017-05-30: 20 mL

## 2017-05-30 MED ORDER — SODIUM CHLORIDE 0.9 % IV SOLN
INTRAVENOUS | Status: DC
  Administered 2017-05-30: 22:00:00 via INTRAVENOUS

## 2017-05-30 MED ORDER — ONDANSETRON HCL 4 MG/2ML IJ SOLN
INTRAMUSCULAR | Status: DC | PRN
  Administered 2017-05-30: 4 mg via INTRAVENOUS

## 2017-05-30 MED ORDER — SODIUM CHLORIDE FLUSH 0.9 % IV SOLN
INTRAVENOUS | Status: AC
Start: 2017-05-30 — End: 2017-05-30
  Filled 2017-05-30: qty 10

## 2017-05-30 MED ORDER — SENNOSIDES-DOCUSATE SODIUM 8.6-50 MG PO TABS
1.0000 | ORAL_TABLET | Freq: Every evening | ORAL | Status: DC | PRN
  Administered 2017-05-30: 1 via ORAL
  Filled 2017-05-30: qty 1

## 2017-05-30 MED ORDER — SODIUM CHLORIDE 0.9% FLUSH
3.0000 mL | Freq: Two times a day (BID) | INTRAVENOUS | Status: DC
  Administered 2017-05-31: 3 mL via INTRAVENOUS

## 2017-05-30 MED ORDER — ACETAMINOPHEN 10 MG/ML IV SOLN
INTRAVENOUS | Status: DC | PRN
  Administered 2017-05-30: 1000 mg via INTRAVENOUS

## 2017-05-30 MED ORDER — OXYCODONE HCL 5 MG PO TABS
10.0000 mg | ORAL_TABLET | ORAL | Status: DC | PRN
  Administered 2017-05-30 – 2017-05-31 (×5): 10 mg via ORAL
  Filled 2017-05-30 (×5): qty 2

## 2017-05-30 MED ORDER — BACITRACIN 50000 UNITS IM SOLR
INTRAMUSCULAR | Status: AC
  Filled 2017-05-30: qty 1

## 2017-05-30 MED ORDER — FAMOTIDINE 20 MG PO TABS
20.0000 mg | ORAL_TABLET | Freq: Once | ORAL | Status: AC
  Administered 2017-05-30: 20 mg via ORAL

## 2017-05-30 MED ORDER — FENTANYL CITRATE (PF) 100 MCG/2ML IJ SOLN
25.0000 ug | INTRAMUSCULAR | Status: AC | PRN
  Administered 2017-05-30 (×6): 25 ug via INTRAVENOUS

## 2017-05-30 MED ORDER — LACTATED RINGERS IV SOLN
INTRAVENOUS | Status: DC
  Administered 2017-05-30: 15:00:00 via INTRAVENOUS
  Administered 2017-05-30: 20 mL/h via INTRAVENOUS
  Administered 2017-05-30: 14:00:00 via INTRAVENOUS

## 2017-05-30 MED ORDER — METHOCARBAMOL 500 MG PO TABS
500.0000 mg | ORAL_TABLET | Freq: Four times a day (QID) | ORAL | Status: DC | PRN
  Administered 2017-05-30 – 2017-05-31 (×2): 500 mg via ORAL
  Filled 2017-05-30 (×2): qty 1

## 2017-05-30 MED ORDER — SUCCINYLCHOLINE CHLORIDE 20 MG/ML IJ SOLN
INTRAMUSCULAR | Status: AC
  Filled 2017-05-30: qty 1

## 2017-05-30 MED ORDER — SUGAMMADEX SODIUM 200 MG/2ML IV SOLN
INTRAVENOUS | Status: DC | PRN
  Administered 2017-05-30: 204.2 mg via INTRAVENOUS

## 2017-05-30 MED ORDER — PROPOFOL 10 MG/ML IV BOLUS
INTRAVENOUS | Status: AC
  Filled 2017-05-30: qty 20

## 2017-05-30 MED ORDER — HYDROMORPHONE HCL 1 MG/ML IJ SOLN: 0.5000 mg | INTRAMUSCULAR | Status: DC | PRN

## 2017-05-30 MED ORDER — BUPIVACAINE-EPINEPHRINE (PF) 0.5% -1:200000 IJ SOLN
INTRAMUSCULAR | Status: AC
  Filled 2017-05-30: qty 30

## 2017-05-30 MED ORDER — ONDANSETRON HCL 4 MG/2ML IJ SOLN: 4.0000 mg | Freq: Once | INTRAMUSCULAR | Status: DC | PRN

## 2017-05-30 MED ORDER — INFLUENZA VAC SPLIT HIGH-DOSE 0.5 ML IM SUSY
0.5000 mL | PREFILLED_SYRINGE | INTRAMUSCULAR | Status: DC
Start: 2017-05-31 — End: 2017-05-31
  Filled 2017-05-30 (×2): qty 0.5

## 2017-05-30 MED ORDER — BUPIVACAINE LIPOSOME 1.3 % IJ SUSP
INTRAMUSCULAR | Status: DC | PRN
  Administered 2017-05-30: 20 mL

## 2017-05-30 MED ORDER — METHYLPREDNISOLONE ACETATE 40 MG/ML IJ SUSP
INTRAMUSCULAR | Status: DC | PRN
  Administered 2017-05-30: 40 mg

## 2017-05-30 MED ORDER — LIDOCAINE HCL (PF) 2 % IJ SOLN
INTRAMUSCULAR | Status: AC
Start: 2017-05-30 — End: 2017-05-30
  Filled 2017-05-30: qty 10

## 2017-05-30 MED ORDER — HYDROCHLOROTHIAZIDE 25 MG PO TABS
25.0000 mg | ORAL_TABLET | Freq: Every day | ORAL | Status: DC
  Administered 2017-05-31: 25 mg via ORAL
  Filled 2017-05-30: qty 1

## 2017-05-30 MED ORDER — ATORVASTATIN CALCIUM 20 MG PO TABS: 80.0000 mg | ORAL_TABLET | Freq: Every day | ORAL | Status: DC

## 2017-05-30 MED ORDER — SODIUM CHLORIDE 0.9 % IV SOLN
INTRAVENOUS | Status: DC | PRN
  Administered 2017-05-30: 15 ug/min via INTRAVENOUS

## 2017-05-30 MED ORDER — OXYCODONE HCL 5 MG PO TABS: 5.0000 mg | ORAL_TABLET | ORAL | Status: DC | PRN

## 2017-05-30 MED ORDER — FAMOTIDINE 20 MG PO TABS
ORAL_TABLET | ORAL | Status: AC
  Administered 2017-05-30: 20 mg via ORAL
  Filled 2017-05-30: qty 1

## 2017-05-30 SURGICAL SUPPLY — 64 items
BAND RUBBER 3X1/6 TAN STRL (MISCELLANEOUS) IMPLANT
BLADE BOVIE TIP EXT 4 (BLADE) ×2 IMPLANT
BUR NEURO DRILL SOFT 3.0X3.8M (BURR) ×2 IMPLANT
CANISTER SUCT 1200ML W/VALVE (MISCELLANEOUS) ×4 IMPLANT
CHLORAPREP W/TINT 26ML (MISCELLANEOUS) ×2 IMPLANT
CNTNR SPEC 2.5X3XGRAD LEK (MISCELLANEOUS) ×1
CONT SPEC 4OZ STER OR WHT (MISCELLANEOUS) ×1
CONTAINER SPEC 2.5X3XGRAD LEK (MISCELLANEOUS) ×1 IMPLANT
COUNTER NEEDLE 20/40 LG (NEEDLE) ×2 IMPLANT
COVER LIGHT HANDLE STERIS (MISCELLANEOUS) ×4 IMPLANT
CUP MEDICINE 2OZ PLAST GRAD ST (MISCELLANEOUS) ×4 IMPLANT
DERMABOND ADVANCED (GAUZE/BANDAGES/DRESSINGS) ×1
DERMABOND ADVANCED .7 DNX12 (GAUZE/BANDAGES/DRESSINGS) ×1 IMPLANT
DRAPE C-ARM 42X72 X-RAY (DRAPES) ×4 IMPLANT
DRAPE LAPAROTOMY 100X77 ABD (DRAPES) ×2 IMPLANT
DRAPE MICROSCOPE SPINE 48X150 (DRAPES) ×2 IMPLANT
DRAPE POUCH INSTRU U-SHP 10X18 (DRAPES) ×2 IMPLANT
DRAPE SURG 17X11 SM STRL (DRAPES) ×8 IMPLANT
DRSG TEGADERM 2-3/8X2-3/4 SM (GAUZE/BANDAGES/DRESSINGS) ×2 IMPLANT
DRSG TEGADERM 4X4.75 (GAUZE/BANDAGES/DRESSINGS) ×2 IMPLANT
DRSG TELFA 4X3 1S NADH ST (GAUZE/BANDAGES/DRESSINGS) ×2 IMPLANT
DURASEAL APPLICATOR TIP (TIP) IMPLANT
DURASEAL SPINE SEALANT 3ML (MISCELLANEOUS) IMPLANT
ELECT CAUTERY BLADE TIP 2.5 (TIP) ×2
ELECT EZSTD 165MM 6.5IN (MISCELLANEOUS) ×2
ELECTRODE CAUTERY BLDE TIP 2.5 (TIP) ×1 IMPLANT
ELECTRODE EZSTD 165MM 6.5IN (MISCELLANEOUS) ×1 IMPLANT
FRAME EYE SHIELD (PROTECTIVE WEAR) ×2 IMPLANT
GLOVE BIOGEL M 6.5 STRL (GLOVE) ×4 IMPLANT
GLOVE BIOGEL PI IND STRL 7.0 (GLOVE) ×2 IMPLANT
GLOVE BIOGEL PI INDICATOR 7.0 (GLOVE) ×2
GLOVE SURG SYN 8.5  E (GLOVE) ×3
GLOVE SURG SYN 8.5 E (GLOVE) ×3 IMPLANT
GOWN SRG XL LVL 3 NONREINFORCE (GOWNS) ×1 IMPLANT
GOWN STRL NON-REIN TWL XL LVL3 (GOWNS) ×1
GOWN STRL REUS W/ TWL LRG LVL3 (GOWN DISPOSABLE) ×1 IMPLANT
GOWN STRL REUS W/TWL LRG LVL3 (GOWN DISPOSABLE) ×1
GRADUATE 1200CC STRL 31836 (MISCELLANEOUS) ×2 IMPLANT
KIT SPINAL PRONEVIEW (KITS) ×2 IMPLANT
KNIFE BAYONET SHORT DISCETOMY (MISCELLANEOUS) IMPLANT
MARKER SKIN DUAL TIP RULER LAB (MISCELLANEOUS) ×6 IMPLANT
NDL SAFETY ECLIPSE 18X1.5 (NEEDLE) ×1 IMPLANT
NEEDLE HYPO 18GX1.5 SHARP (NEEDLE) ×1
NEEDLE HYPO 22GX1.5 SAFETY (NEEDLE) ×2 IMPLANT
NS IRRIG 1000ML POUR BTL (IV SOLUTION) ×2 IMPLANT
PACK LAMINECTOMY NEURO (CUSTOM PROCEDURE TRAY) ×2 IMPLANT
PAD ARMBOARD 7.5X6 YLW CONV (MISCELLANEOUS) ×2 IMPLANT
PATTIES SURGICAL .5X1.5 (GAUZE/BANDAGES/DRESSINGS) IMPLANT
SPOGE SURGIFLO 8M (HEMOSTASIS) ×1
SPONGE SURGIFLO 8M (HEMOSTASIS) ×1 IMPLANT
STAPLER SKIN PROX 35W (STAPLE) IMPLANT
SUT DVC VLOC 3-0 CL 6 P-12 (SUTURE) ×2 IMPLANT
SUT NURALON 4 0 TR CR/8 (SUTURE) IMPLANT
SUT VIC AB 0 CT1 27 (SUTURE)
SUT VIC AB 0 CT1 27XCR 8 STRN (SUTURE) IMPLANT
SUT VIC AB 2-0 CT1 18 (SUTURE) ×2 IMPLANT
SUT VICRYL 0 AB UR-6 (SUTURE) ×2 IMPLANT
SYR 20CC LL (SYRINGE) ×2 IMPLANT
SYR 3ML LL SCALE MARK (SYRINGE) ×2 IMPLANT
SYRINGE 10CC LL (SYRINGE) ×2 IMPLANT
TOWEL OR 17X26 4PK STRL BLUE (TOWEL DISPOSABLE) ×8 IMPLANT
TUBE MATRX SPINL 18MM 7CM DISP (INSTRUMENTS) ×1
TUBE METRX SPINAL 18X7 DISP (INSTRUMENTS) ×1 IMPLANT
TUBING CONNECTING 10 (TUBING) ×2 IMPLANT

## 2017-05-30 NOTE — Anesthesia Preprocedure Evaluation (Addendum)
Anesthesia Evaluation  Patient identified by MRN, date of birth, ID band Patient awake    Reviewed: Allergy & Precautions, NPO status , Patient's Chart, lab work & pertinent test results, reviewed documented beta blocker date and time   Airway Mallampati: III  TM Distance: >3 FB     Dental  (+) Chipped, Partial Lower, Partial Upper   Pulmonary COPD, Current Smoker,           Cardiovascular hypertension, Pt. on medications and Pt. on home beta blockers      Neuro/Psych  Headaches, PSYCHIATRIC DISORDERS Anxiety Depression TIA   GI/Hepatic PUD, GERD  Controlled,  Endo/Other    Renal/GU      Musculoskeletal  (+) Arthritis ,   Abdominal   Peds  Hematology   Anesthesia Other Findings Denies stroke sounds like TIA. Denies motion sickness.  Reproductive/Obstetrics                            Anesthesia Physical Anesthesia Plan  ASA: III  Anesthesia Plan:    Post-op Pain Management:    Induction: Intravenous  PONV Risk Score and Plan: 1  Airway Management Planned: Oral ETT  Additional Equipment:   Intra-op Plan:   Post-operative Plan:   Informed Consent: I have reviewed the patients History and Physical, chart, labs and discussed the procedure including the risks, benefits and alternatives for the proposed anesthesia with the patient or authorized representative who has indicated his/her understanding and acceptance.     Plan Discussed with: CRNA  Anesthesia Plan Comments:         Anesthesia Quick Evaluation

## 2017-05-30 NOTE — H&P (Signed)
  Heart sounds normal no MRG. Chest Clear to Auscultation Bilaterally.  I have reviewed and confirmed my history and physical from 05/24/17 with no additions or changes. Plan for L3-S1 decompression.  Risks and benefits reviewed.

## 2017-05-30 NOTE — Op Note (Signed)
Indications: Ms. Slaght is a 66 yo female with history of neurogenic claudication who failed conservative management.  Findings: lumbar stenosis  Preoperative Diagnosis: Lumbar Stenosis with neurogenic claudication Postoperative Diagnosis: same   EBL: 100 ml IVF: 1300 ml Drains: none Disposition: Extubated and Stable to PACU Complications: none  No foley catheter was placed.   Preoperative Note:   Risks of surgery discussed include: infection, bleeding, stroke, coma, death, paralysis, CSF leak, nerve/spinal cord injury, numbness, tingling, weakness, complex regional pain syndrome, recurrent stenosis and/or disc herniation, vascular injury, development of instability, neck/back pain, need for further surgery, persistent symptoms, development of deformity, and the risks of anesthesia. They understood these risks and have agreed to proceed.  Operative Note:   1. L3-S1 lumbar decompression including central laminectomy and bilateral medial facetectomies including foraminotomies  The patient was then brought from the preoperative center with intravenous access established.  The patient underwent general anesthesia and endotracheal tube intubation, and was then rotated on the Foster rail top where all pressure points were appropriately padded.  The skin was then thoroughly cleansed.  Perioperative antibiotic prophylaxis was administered.  Sterile prep and drapes were then applied and a timeout was then observed.  C-arm was brought into the field under sterile conditions and under lateral visualization the L3-4, L4-5, and L5-S1 interspaces were identified and marked.  The incision was marked on the left and injected with local anesthetic. Once this was complete a 6 cm incision was opened with the use of a #10 blade knife.   We started with the L5-S1 level. The metrx tubes were sequentially advanced and confirmed in position. An 51m by 738mtube was locked in place to the bed side attachment.   Fluoroscopy was then removed from the field.  The microscope was then sterilely brought into the field and muscle creep was hemostased with a bipolar and resected with a pituitary rongeur.  A Bovie extender was then used to expose the spinous process and lamina.  Careful attention was placed to not violate the facet capsule. A 3 mm matchstick drill bit was then used to make a hemi-laminotomy trough until the ligamentum flavum was exposed.  This was extended to the base of the spinous process and to the contralateral side to remove all the central bone from each side.  Once this was complete and the underlying ligamentum flavum was visualized, it was dissected with a curette and resected with Kerrison rongeurs.  Extensive ligamentum hypertrophy was noted, requiring a substantial amount of time and care for removal.  The dura was identified and palpated. The kerrison rongeur was then used to remove the medial facet bilaterally until no compression was noted.  A balltip probe was used to confirm decompression of the left S1 nerve root.  Additional attention was paid to completion of the contralateral L5-S1 foraminotomy until the right S1 nerve root was completely free.  Once this was complete, L5-S1 central decompression including medial facetectomy and foraminotomy was confirmed and decompression on both sides was confirmed. A small CSF leak was noted, which was augmented with duragen and tisseel.  The wound was copiously irrigated. The tube system was then removed under microscopic visualization and hemostasis was obtained with a bipolar.    We then moved to the L4-5 level. The metrx tubes were sequentially advanced and confirmed in position. An 1863my 34m19mbe was locked in place to the bed side attachment.  Fluoroscopy was then removed from the field.  The microscope was then sterilely brought  into the field and muscle creep was hemostased with a bipolar and resected with a pituitary rongeur.  A Bovie  extender was then used to expose the spinous process and lamina.  Careful attention was placed to not violate the facet capsule. A 3 mm matchstick drill bit was then used to make a hemi-laminotomy trough until the ligamentum flavum was exposed.  This was extended to the base of the spinous process and to the contralateral side to remove all the central bone from each side.  Once this was complete and the underlying ligamentum flavum was visualized, it was dissected with a curette and resected with Kerrison rongeurs.  Extensive ligamentum hypertrophy was noted, requiring a substantial amount of time and care for removal.  The dura was identified and palpated. The kerrison rongeur was then used to remove the medial facet bilaterally until no compression was noted.  A balltip probe was used to confirm decompression of the left L5 nerve root.  Additional attention was paid to completion of the contralateral L4-5 foraminotomy until the right L5 nerve root was completely free.  Once this was complete, L4-5 central decompression including medial facetectomy and foraminotomy was confirmed and decompression on both sides was confirmed. A small CSF leak was noted, which was augmented with duragen and tisseel.  The wound was copiously irrigated. The tube system was then removed under microscopic visualization and hemostasis was obtained with a bipolar.     We then moved to the L3-4 level. The metrx tubes were sequentially advanced and confirmed in position. An 54m by 750mtube was locked in place to the bed side attachment.  Fluoroscopy was then removed from the field.  The microscope was then sterilely brought into the field and muscle creep was hemostased with a bipolar and resected with a pituitary rongeur.  A Bovie extender was then used to expose the spinous process and lamina.  Careful attention was placed to not violate the facet capsule. A 3 mm matchstick drill bit was then used to make a hemi-laminotomy trough  until the ligamentum flavum was exposed.  This was extended to the base of the spinous process and to the contralateral side to remove all the central bone from each side.  Once this was complete and the underlying ligamentum flavum was visualized, it was dissected with a curette and resected with Kerrison rongeurs.  Extensive ligamentum hypertrophy was noted, requiring a substantial amount of time and care for removal.  The dura was identified and palpated. The kerrison rongeur was then used to remove the medial facet bilaterally until no compression was noted.  A balltip probe was used to confirm decompression of the left L4 nerve root.  Additional attention was paid to completion of the contralateral L3-4 foraminotomy until the right L4 nerve root was completely free.  Once this was complete, L3-4 central decompression including medial facetectomy and foraminotomy was confirmed and decompression on both sides was confirmed.   The wound was copiously irrigated. The tube system was then removed under microscopic visualization and hemostasis was obtained with a bipolar.    The fascial layer was reapproximated with the use of a 0 Vicryl suture.  Subcutaneous and dermal tissue layer was reapproximated using 2-0 Vicryl suture.  3-0 nylon was placed in on the dermis. The skin was then cleansed and dressed.  Patient was then rotated back to the preoperative bed awakened from anesthesia and taken to recovery all counts are correct in this case.  I performed the entire procedure  with the assistance of Marin Olp PA as an Pensions consultant.  Katelynd Blauvelt K. Izora Ribas MD

## 2017-05-30 NOTE — Anesthesia Procedure Notes (Signed)
Procedure Name: Intubation Date/Time: 05/30/2017 1:23 PM Performed by: Eben Burow, CRNA Pre-anesthesia Checklist: Patient identified, Emergency Drugs available, Suction available, Patient being monitored and Timeout performed Patient Re-evaluated:Patient Re-evaluated prior to induction Oxygen Delivery Method: Circle system utilized Preoxygenation: Pre-oxygenation with 100% oxygen Induction Type: IV induction Ventilation: Mask ventilation without difficulty Laryngoscope Size: Miller and 2 Grade View: Grade I Tube type: Oral Number of attempts: 1 Airway Equipment and Method: Stylet Placement Confirmation: ETT inserted through vocal cords under direct vision,  positive ETCO2 and breath sounds checked- equal and bilateral Secured at: 22 cm Tube secured with: Tape Dental Injury: Teeth and Oropharynx as per pre-operative assessment

## 2017-05-30 NOTE — Progress Notes (Signed)
  History: Tammy Lamb is POD0 s/p 3 level L3-S1 lumbar decompression for neurogenic claudication. Patient tolerated procedure well. Patient seen in recovery and still groggy from anesthesia. Complains of back pain but unable to rate. Going in and out of sleep through conversation. Denies numbness, tingling, or pain in extremities.   Physical Exam: Vitals:   05/30/17 1645 05/30/17 1700  BP: (!) 120/92 128/74  Pulse: 70 73  Resp: 16 13  Temp:    SpO2: 95% 96%    AA Ox3 CNI Skin: incision dressed. Clean and dry Strength:5/5 throughout, sensation intact and symmetric upper and lower extremities.   Data:  Recent Labs  Lab 05/28/17 1236  NA 134*  K 3.8  CL 96*  CO2 28  BUN 13  CREATININE 0.78  GLUCOSE 106*  CALCIUM 9.3   No results for input(s): AST, ALT, ALKPHOS in the last 168 hours.  Invalid input(s): TBILI   Recent Labs  Lab 05/28/17 1236  WBC 6.1  HGB 12.4  HCT 37.1  PLT 394   Recent Labs  Lab 05/28/17 1236  APTT 31  INR 1.06           Assessment/Plan:  Tammy Lamb is POD0 from 3 level decompression and is doing well overall. Pain will be controlled with tylenol, robaxin, and oxycodone.   Mobilize pain control DVT prophylaxis  Marin Olp PA-C Department of Neurosurgery

## 2017-05-30 NOTE — Anesthesia Post-op Follow-up Note (Signed)
Anesthesia QCDR form completed.        

## 2017-05-30 NOTE — Transfer of Care (Signed)
Immediate Anesthesia Transfer of Care Note  Patient: Tammy Lamb  Procedure(s) Performed: LUMBAR LAMINECTOMY/DECOMPRESSION MICRODISCECTOMY 3 LEVELS-L3-S1 (N/A )  Patient Location: PACU  Anesthesia Type:General  Level of Consciousness: awake and alert   Airway & Oxygen Therapy: Patient Spontanous Breathing and Patient connected to face mask oxygen  Post-op Assessment: Report given to RN and Post -op Vital signs reviewed and stable  Post vital signs: Reviewed and stable  Last Vitals:  Vitals:   05/30/17 0805  BP: (!) 146/102  Pulse: 69  Resp: 18  Temp: 37.5 C  SpO2: 96%    Last Pain:  Vitals:   05/30/17 0805  TempSrc: Tympanic  PainSc: 7       Patients Stated Pain Goal: 1 (39/35/94 0905)  Complications: No apparent anesthesia complications

## 2017-05-31 ENCOUNTER — Other Ambulatory Visit: Payer: Self-pay

## 2017-05-31 ENCOUNTER — Encounter: Payer: Self-pay | Admitting: Neurosurgery

## 2017-05-31 DIAGNOSIS — M48062 Spinal stenosis, lumbar region with neurogenic claudication: Secondary | ICD-10-CM | POA: Diagnosis not present

## 2017-05-31 MED ORDER — METHOCARBAMOL 500 MG PO TABS: 500.0000 mg | ORAL_TABLET | Freq: Four times a day (QID) | ORAL | 0 refills | Status: DC | PRN

## 2017-05-31 MED ORDER — OXYCODONE HCL 5 MG PO TABS: 5.0000 mg | ORAL_TABLET | ORAL | 0 refills | Status: DC | PRN

## 2017-05-31 NOTE — Discharge Planning (Signed)
Patient's IV removed. RN assessment and VS revealed stability for DC to home. Discharge papers given, explained and educated. Informed of suggested FU appt and appt made by Dr. Gabriel Carina (verbally confirmed with P.A.) Script e-scribed to [facility] and pain med signed printed and given.  Given pain meds just prior to DC. Wheeled to front and family transported home via car.

## 2017-05-31 NOTE — Progress Notes (Signed)
Clinical Education officer, museum (CSW) received SNF consult. Patient is medicare observation and has no payer for SNF. RN case manager consult is placed. Please reconsult if future social work needs arise. CSW signing off.   [employer], LCSW [phone]

## 2017-05-31 NOTE — Care Management Note (Signed)
Case Management Note  Patient Details  Name: Tammy Lamb MRN: [id number] Date of Birth: 05/23/1951  Subjective/Objective:                  Spoke with patient regarding discharge planning. Patient states that [facility] home [facility] has already been arranged for her at discharge. I confirmed with Dr. Izora Ribas and he states that [facility] can obtain orders from his office. He also states that [facility] has a protocol to include assessing patient for DME and HHPT will evaluate for rolling walker need.  Action/Plan: I spoke with Joelene Millin with [facility] and she confirmed above.    Expected Discharge Date:  05/31/17               Expected Discharge Plan:     In-House Referral:     Discharge planning Services  CM Consult  Post Acute Care Choice:  Home Health Choice offered to:  Patient  DME Arranged:    DME Agency:     HH Arranged:  PT HH Agency:  [facility]  Status of Service:  In process, will continue to follow  If discussed at Long Length of Stay Meetings, dates discussed:    Additional Comments:  Marshell Garfinkel, RN 05/31/2017, 9:59 AM

## 2017-05-31 NOTE — Discharge Summary (Signed)
  History: Tammy Lamb is POD1 s/p L3-S1 lumbar decompression. Patient tolerated procedure without issue. Complains of back pain 3/10, controlled with tylenol, robaxin, and oxycodone. Denies extremity pain, numbness, or tingling. Unable to determine resolution of symptoms that were present prior to surgery since symptoms presented mainly with ambulation. Patient able to eat and void without issue. Ambulation is slow but steady. At home physical therapy scheduled to start evaluation and treatment tomorrow.   Physical Exam: Vitals:   05/31/17 0504 05/31/17 0824  BP: 107/78 104/65  Pulse: 90 77  Resp: 18 20  Temp: 98.4 F (36.9 C) 99.2 F (37.3 C)  SpO2: 93% 97%    AA Ox3 CNI Skin: inicision intact. No warmth, erythema, or drainage. New dressing applied.  Strength:5/5 throughout, sensation intact and symmetric upper and lower extremities.   Data:  Recent Labs  Lab 05/28/17 1236  NA 134*  K 3.8  CL 96*  CO2 28  BUN 13  CREATININE 0.78  GLUCOSE 106*  CALCIUM 9.3   No results for input(s): AST, ALT, ALKPHOS in the last 168 hours.  Invalid input(s): TBILI   Recent Labs  Lab 05/28/17 1236  WBC 6.1  HGB 12.4  HCT 37.1  PLT 394   Recent Labs  Lab 05/28/17 1236  APTT 31  INR 1.06         Assessment/Plan:  Tammy Lamb is POD1 s/p 3 level decompression. Recovery has gone well. Pain will continue to be controlled with tylenol, robaxin, and oxycodone. Incision dressing was changed. Patient advised to remove dressing in 2-3 days. Keep site clean and dry. No bending, twisting, or lifting greater than 10 pounds for 6 weeks. Patient will follow up in clinic in 2 weeks to monitor progress and for suture removal. At-home PT scheduled for tomorrow.  All questions answered. Advised if any questions or concerns arise, to contact office.   Marin Olp PA-C Department of Neurosurgery

## 2017-05-31 NOTE — Care Management Obs Status (Signed)
[place] [facility] NOTIFICATION   Patient Details  Name: Tammy Lamb MRN: [id number] Date of Birth: [birth year]   Medicare Observation Status Notification Given:  Yes    Marshell Garfinkel, RN 05/31/2017, 10:05 AM

## 2017-05-31 NOTE — Anesthesia Postprocedure Evaluation (Signed)
Anesthesia Post Note  Patient: Tammy Lamb  Procedure(s) Performed: LUMBAR LAMINECTOMY/DECOMPRESSION MICRODISCECTOMY 3 LEVELS-L3-S1 (N/A )  Patient location during evaluation: PACU Anesthesia Type: General Level of consciousness: awake and alert Pain management: pain level controlled Vital Signs Assessment: post-procedure vital signs reviewed and stable Respiratory status: spontaneous breathing, nonlabored ventilation, respiratory function stable and patient connected to nasal cannula oxygen Cardiovascular status: blood pressure returned to baseline and stable Postop Assessment: no apparent nausea or vomiting Anesthetic complications: no     Last Vitals:  Vitals:   05/31/17 0004 05/31/17 0504  BP: 117/66 107/78  Pulse: 77 90  Resp: 18 18  Temp: 36.7 C 36.9 C  SpO2: 93% 93%    Last Pain:  Vitals:   05/31/17 0504  TempSrc: Oral  PainSc:                  Precious Haws [place]

## 2017-05-31 NOTE — Discharge Instructions (Signed)
Your surgeon has performed an operation on your lumbar spine (low back) to relieve pressure on one or more nerves. Many times, patients feel better immediately after surgery and can overdo it. Even if you feel well, it is important that you follow these activity guidelines. If you do not let your back heal properly from the surgery, you can increase the chance of a disc herniation and/or return of your symptoms. The following are instructions to help in your recovery once you have been discharged from the hospital.  * Do not take anti-inflammatory medications for 3 days after surgery (naproxen [Aleve], ibuprofen [Advil, Motrin], celecoxib [Celebrex], etc.)  Activity    No bending, lifting, or twisting (BLT). Avoid lifting objects heavier than 10 pounds (gallon milk jug).  Where possible, avoid household activities that involve lifting, bending, pushing, or pulling such as laundry, vacuuming, grocery shopping, and childcare. Try to arrange for help from friends and family for these activities while your back heals.  Increase physical activity slowly as tolerated.  Taking short walks is encouraged, but avoid strenuous exercise. Do not jog, run, bicycle, lift weights, or participate in any other exercises unless specifically allowed by your doctor. Avoid prolonged sitting, including car rides.  Talk to your doctor before resuming sexual activity.  You should not drive until cleared by your doctor.  Until released by your doctor, you should not return to work or school.  You should rest at home and let your body heal.   You may shower two days after your surgery.  After showering, lightly dab your incision dry. Do not take a tub bath or go swimming for 3 weeks, or until approved by your doctor at your follow-up appointment.  If you smoke, we strongly recommend that you quit.  Smoking has been proven to interfere with normal healing in your back and will dramatically reduce the success rate of  your surgery. Please contact [facility] (800-QUIT-NOW) and use the resources at www.[facility].com for assistance in stopping smoking.  Surgical Incision   If you have a dressing on your incision, you may remove it three days after your surgery. Keep your incision area clean and dry.  If you have staples or stitches on your incision, you should have a follow up scheduled for removal. If you do not have staples or stitches, you will have steri-strips (small pieces of surgical tape) or Dermabond glue. The steri-strips/glue should begin to peel away within about a week (it is fine if the steri-strips fall off before then). If the strips are still in place one week after your surgery, you may gently remove them.  Diet            You may return to your usual diet. Be sure to stay hydrated.  When to Contact us  Although your surgery and recovery will likely be uneventful, you may have some residual numbness, aches, and pains in your back and/or legs. This is normal and should improve in the next few weeks.  However, should you experience any of the following, contact us immediately:  New numbness or weakness  Pain that is progressively getting worse, and is not relieved by your pain medications or rest  Bleeding, redness, swelling, pain, or drainage from surgical incision  Chills or flu-like symptoms  Fever greater than 101.0 F (38.3 C)  Problems with bowel or bladder functions  Difficulty breathing or shortness of breath  Warmth, tenderness, or swelling in your calf  Contact Information  During office hours (Monday-Friday  9 am to 5 pm), please call your physician at [phone]  After hours and weekends, please call the [place] Operator at [phone] and ask for the Neurosurgery Resident On Call   For a life-threatening emergency, call 911

## 2017-07-12 ENCOUNTER — Encounter: Payer: Self-pay | Admitting: Neurosurgery

## 2017-08-22 ENCOUNTER — Other Ambulatory Visit: Payer: Self-pay | Admitting: Neurosurgery

## 2017-08-22 DIAGNOSIS — M48062 Spinal stenosis, lumbar region with neurogenic claudication: Secondary | ICD-10-CM

## 2017-08-29 ENCOUNTER — Ambulatory Visit
Admission: RE | Admit: 2017-08-29 | Discharge: 2017-08-29 | Disposition: A | Discharge status: Home / Self Care | Payer: Medicare Other | Source: Ambulatory Visit | Attending: Neurosurgery | Admitting: Neurosurgery

## 2017-08-29 DIAGNOSIS — M48062 Spinal stenosis, lumbar region with neurogenic claudication: Secondary | ICD-10-CM | POA: Insufficient documentation

## 2017-08-29 DIAGNOSIS — M4807 Spinal stenosis, lumbosacral region: Secondary | ICD-10-CM | POA: Insufficient documentation

## 2017-08-29 DIAGNOSIS — Z9889 Other specified postprocedural states: Secondary | ICD-10-CM | POA: Diagnosis not present

## 2017-08-30 ENCOUNTER — Ambulatory Visit: Payer: Medicare Other

## 2017-09-27 ENCOUNTER — Ambulatory Visit
Payer: Medicare Other | Attending: Student in an Organized Health Care Education/Training Program | Admitting: Student in an Organized Health Care Education/Training Program

## 2017-09-27 ENCOUNTER — Other Ambulatory Visit: Payer: Self-pay

## 2017-09-27 ENCOUNTER — Encounter: Payer: Self-pay | Admitting: Student in an Organized Health Care Education/Training Program

## 2017-09-27 VITALS — BP 149/87 | HR 72 | Temp 97.8°F | Resp 16 | Ht 66.0 in | Wt 220.0 lb

## 2017-09-27 DIAGNOSIS — Z882 Allergy status to sulfonamides status: Secondary | ICD-10-CM | POA: Diagnosis not present

## 2017-09-27 DIAGNOSIS — J449 Chronic obstructive pulmonary disease, unspecified: Secondary | ICD-10-CM | POA: Insufficient documentation

## 2017-09-27 DIAGNOSIS — I10 Essential (primary) hypertension: Secondary | ICD-10-CM | POA: Insufficient documentation

## 2017-09-27 DIAGNOSIS — F419 Anxiety disorder, unspecified: Secondary | ICD-10-CM | POA: Insufficient documentation

## 2017-09-27 DIAGNOSIS — Z8673 Personal history of transient ischemic attack (TIA), and cerebral infarction without residual deficits: Secondary | ICD-10-CM | POA: Insufficient documentation

## 2017-09-27 DIAGNOSIS — M545 Low back pain: Secondary | ICD-10-CM | POA: Diagnosis not present

## 2017-09-27 DIAGNOSIS — F17210 Nicotine dependence, cigarettes, uncomplicated: Secondary | ICD-10-CM | POA: Diagnosis not present

## 2017-09-27 DIAGNOSIS — M5416 Radiculopathy, lumbar region: Secondary | ICD-10-CM | POA: Diagnosis not present

## 2017-09-27 DIAGNOSIS — M5136 Other intervertebral disc degeneration, lumbar region: Secondary | ICD-10-CM | POA: Insufficient documentation

## 2017-09-27 DIAGNOSIS — M48062 Spinal stenosis, lumbar region with neurogenic claudication: Secondary | ICD-10-CM | POA: Insufficient documentation

## 2017-09-27 DIAGNOSIS — M47816 Spondylosis without myelopathy or radiculopathy, lumbar region: Secondary | ICD-10-CM | POA: Insufficient documentation

## 2017-09-27 DIAGNOSIS — Z9049 Acquired absence of other specified parts of digestive tract: Secondary | ICD-10-CM | POA: Diagnosis not present

## 2017-09-27 DIAGNOSIS — G9519 Other vascular myelopathies: Secondary | ICD-10-CM

## 2017-09-27 DIAGNOSIS — F329 Major depressive disorder, single episode, unspecified: Secondary | ICD-10-CM | POA: Insufficient documentation

## 2017-09-27 DIAGNOSIS — M5116 Intervertebral disc disorders with radiculopathy, lumbar region: Secondary | ICD-10-CM | POA: Diagnosis not present

## 2017-09-27 DIAGNOSIS — M1288 Other specific arthropathies, not elsewhere classified, other specified site: Secondary | ICD-10-CM | POA: Diagnosis not present

## 2017-09-27 DIAGNOSIS — Z79899 Other long term (current) drug therapy: Secondary | ICD-10-CM | POA: Diagnosis not present

## 2017-09-27 DIAGNOSIS — G894 Chronic pain syndrome: Secondary | ICD-10-CM | POA: Insufficient documentation

## 2017-09-27 DIAGNOSIS — M51369 Other intervertebral disc degeneration, lumbar region without mention of lumbar back pain or lower extremity pain: Secondary | ICD-10-CM | POA: Insufficient documentation

## 2017-09-27 DIAGNOSIS — K219 Gastro-esophageal reflux disease without esophagitis: Secondary | ICD-10-CM | POA: Insufficient documentation

## 2017-09-27 MED ORDER — GABAPENTIN 300 MG PO CAPS: ORAL_CAPSULE | ORAL | 1 refills | Status: DC

## 2017-09-27 NOTE — Progress Notes (Signed)
Patient's Name: Tammy Lamb  MRN: [id number]  Referring Provider: Meade Maw, MD  DOB: May 16, 1951  PCP: Guadlupe Spanish, MD  DOS: 09/27/2017  Note by: Gillis Santa, MD  Service setting: Ambulatory outpatient  Specialty: Interventional Pain Management  Location: [facility] (AMB) Pain Management Facility  Visit type: Initial Patient Evaluation  Patient type: New Patient   Primary Reason(s) for Visit: Encounter for initial evaluation of one or more chronic problems (new to examiner) potentially causing chronic pain, and posing a threat to normal musculoskeletal function. (Level of risk: High) CC: Back Pain (mid-lower)  HPI  Tammy Lamb is a 67 y.o. year old, female patient, who comes today to see [place] for the first time for an initial evaluation of her chronic pain. She has Neurogenic claudication; Spinal stenosis, lumbar region, with neurogenic claudication; Lumbar radiculopathy; Lumbar degenerative disc disease; Lumbar facet arthropathy; and Chronic pain syndrome on their problem list. Today she comes in for evaluation of her Back Pain (mid-lower)  Pain Assessment: Location: Mid, Lower Back Radiating: buttocks/hips down back of legs then around to the front down to top of foot, effects entire great toe bilateral, left side worse Onset: More than a month ago Duration: Chronic pain Quality: Aching, Constant, Burning, Pins and needles, Sore, Shooting Severity: 9 /10 (self-reported pain score)  Note: Reported level is inconsistent with clinical observations. Clinically the patient looks like a 4/10             When using our objective Pain Scale, levels between 6 and 10/10 are said to belong in an emergency room, as it progressively worsens from a 6/10, described as severely limiting, requiring emergency care not usually available at an outpatient pain management facility. At a 6/10 level, communication becomes difficult and requires great effort. Assistance to reach the emergency department may be  required. Facial flushing and profuse sweating along with potentially dangerous increases in heart rate and blood pressure will be evident. Effect on ADL: Prolonged standing, walking Timing: Constant Modifying factors: rest, heat, ice  Onset and Duration: Gradual Cause of pain: Unknown Severity: Getting worse, NAS-11 at its worse: 8/10, NAS-11 at its best: 6/10, NAS-11 now: 8/10 and NAS-11 on the average: 8/10 Timing: Not influenced by the time of the day Aggravating Factors: Motion, Stooping  and Surgery made it worse Alleviating Factors: Hot packs, Lying down and Medications Associated Problems: Numbness, Spasms, Tingling and Weakness Quality of Pain: Aching, Burning, Exhausting, Feeling of weight, Pressure-like, Pulsating, Sharp, Stabbing, Tender, Throbbing, Tingling, Tiring, Toothache-like and Uncomfortable Previous Examinations or Tests: CT scan, MRI scan, Nerve conduction test, Neurological evaluation and Neurosurgical evaluation Previous Treatments: Epidural steroid injections and Narcotic medications  The patient comes into the clinics today for the first time for a chronic pain management evaluation.  Patient is a very pleasant 67 year old female who presents with a chief complaint of axial low back pain that radiates down into her bilateral buttocks and hips status post L3-S1 lumbar laminectomy and decompression.  She has had ongoing axial low back pain along with weakness of her lower extremities secondary to spinal stenosis and neurogenic claudication.  Repeat spine MRI after surgery does not show any new or worsening compression.  Nerve conduction shows evidence of chronic L4, L5 radiculopathy.  She endorses numbness and tingling in her lower extremities and finds it difficult to walk for an extended period of time.  She does perform activities of daily living but is hurting pretty significantly after washing her clothes or washing the dishes.  She is looking forward to going to a blues  concert next month but is wondering if her pain will allow her to attend the concert.  Patient has not tried gabapentin, Lyrica, amitriptyline, nortriptyline, Cymbalta, baclofen, tizanidine.  She has been on Robaxin in the past which she found effective.  She tried Flexeril which made her sedated.  She has been taking oxycodone 5 mg twice daily to 3 times daily which she finds moderately effective in helping to decrease the intensity of her pain but by no means is a take her pain away.  She also uses melatonin at night for sleep aid.  Today I took the time to provide the patient with information regarding my pain practice. The patient was informed that my practice is divided into two sections: an interventional pain management section, as well as a completely separate and distinct medication management section. I explained that I have procedure days for my interventional therapies, and evaluation days for follow-ups and medication management. Because of the amount of documentation required during both, they are kept separated. This means that there is the possibility that she may be scheduled for a procedure on one day, and medication management the next. I have also informed her that because of staffing and facility limitations, I no longer take patients for medication management only. To illustrate the reasons for this, I gave the patient the example of surgeons, and how inappropriate it would be to refer a patient to his/her care, just to write for the post-surgical antibiotics on a surgery done by a different surgeon.   Because interventional pain management is my board-certified specialty, the patient was informed that joining my practice means that they are open to any and all interventional therapies. I made it clear that this does not mean that they will be forced to have any procedures done. What this means is that I believe interventional therapies to be essential part of the diagnosis and proper  management of chronic pain conditions. Therefore, patients not interested in these interventional alternatives will be better served under the care of a different practitioner.  The patient was also made aware of my Comprehensive Pain Management Safety Guidelines where by joining my practice, they limit all of their nerve blocks and joint injections to those done by our practice, for as long as we are retained to manage their care.   Historic Controlled Substance Pharmacotherapy Review  PMP and historical list of controlled substances: Oxycodone 5 mill grams, quantity 40, last fill 09/11/2017 MME/day: 30 mg/day Medications: The patient did not bring the medication(s) to the appointment, as requested in our "New Patient Package" Pharmacodynamics: Desired effects: Analgesia: The patient reports >50% benefit. Reported improvement in function: The patient reports medication allows her to accomplish basic ADLs. Clinically meaningful improvement in function (CMIF): Sustained CMIF goals met Perceived effectiveness: Described as relatively effective, allowing for increase in activities of daily living (ADL) Undesirable effects: Side-effects or Adverse reactions: None reported Historical Monitoring: The patient  reports that she does not use drugs. List of all UDS Test(s): No results found for: MDMA, COCAINSCRNUR, [place], [place], CANNABQUANT, [place], [place] List of other Serum/Urine Drug Screening Test(s):  No results found for: AMPHSCRSER, BARBSCRSER, BENZOSCRSER, COCAINSCRSER, COCAINSCRNUR, PCPSCRSER, PCPQUANT, THCSCRSER, THCU, CANNABQUANT, OPIATESCRSER, OXYSCRSER, PROPOXSCRSER, ETH Historical Background Evaluation: [place] PMP: Six (6) year initial data search conducted.             [place] Department of public safety, offender search: Editor, commissioning Information) Non-contributory Risk Assessment Profile: Aberrant behavior:  None observed or detected today Risk factors for fatal opioid overdose: None identified  today Fatal overdose hazard ratio (HR): Calculation deferred Non-fatal overdose hazard ratio (HR): Calculation deferred Risk of opioid abuse or dependence: 0.7-3.0% with doses ? 36 MME/day and 6.1-26% with doses ? 120 MME/day. Substance use disorder (SUD) risk level: Low Opioid risk tool (ORT) (Total Score): 1 Opioid Risk Tool - 09/27/17 1321      Family History of Substance Abuse   Alcohol  Negative    Illegal Drugs  Negative    Rx Drugs  Negative      Personal History of Substance Abuse   Alcohol  Negative    Illegal Drugs  Negative    Rx Drugs  Negative      Psychological Disease   Psychological Disease  Negative    Depression  Positive      Total Score   Opioid Risk Tool Scoring  1    Opioid Risk Interpretation  Low Risk      ORT Scoring interpretation table:  Score <3 = Low Risk for SUD  Score between 4-7 = Moderate Risk for SUD  Score >8 = High Risk for Opioid Abuse   PHQ-2 Depression Scale:  Total score: 0  PHQ-2 Scoring interpretation table: (Score and probability of major depressive disorder)  Score 0 = No depression  Score 1 = 15.4% Probability  Score 2 = 21.1% Probability  Score 3 = 38.4% Probability  Score 4 = 45.5% Probability  Score 5 = 56.4% Probability  Score 6 = 78.6% Probability   PHQ-9 Depression Scale:  Total score: 0  PHQ-9 Scoring interpretation table:  Score 0-4 = No depression  Score 5-9 = Mild depression  Score 10-14 = Moderate depression  Score 15-19 = Moderately severe depression  Score 20-27 = Severe depression (2.4 times higher risk of SUD and 2.89 times higher risk of overuse)   Pharmacologic Plan: As per protocol, I have not taken over any controlled substance management, pending the results of ordered tests and/or consults.            Initial impression: Pending review of available data and ordered tests.  Meds   Current Outpatient Medications:  .  acetaminophen (TYLENOL) 500 MG tablet, Take 1,000 mg by mouth at bedtime as  needed (pain)., Disp: , Rfl:  .  atorvastatin (LIPITOR) 80 MG tablet, Take 80 mg by mouth daily at 6 PM., Disp: , Rfl:  .  carvedilol (COREG) 25 MG tablet, Take 12.5 mg by mouth 2 (two) times daily., Disp: , Rfl:  .  Cholecalciferol (VITAMIN D3) 2000 units capsule, Take 2,000 Units by mouth at bedtime., Disp: , Rfl:  .  CINNAMON PO, Take 1,000 mg by mouth at bedtime., Disp: , Rfl:  .  citalopram (CELEXA) 20 MG tablet, Take 20 mg by mouth every evening., Disp: , Rfl:  .  hydrochlorothiazide (HYDRODIURIL) 25 MG tablet, Take 25 mg by mouth daily., Disp: , Rfl:  .  Melatonin 10 MG TABS, Take 10 mg by mouth at bedtime as needed (sleep)., Disp: , Rfl:  .  Omeprazole-Sodium Bicarbonate (ZEGERID OTC) 20-1100 MG CAPS capsule, Take 1 capsule by mouth 2 (two) times daily., Disp: , Rfl:  .  oxyCODONE (OXY IR/ROXICODONE) 5 MG immediate release tablet, Take 1 tablet (5 mg total) every 3 (three) hours as needed by mouth for moderate pain ((score 4 to 6))., Disp: 30 tablet, Rfl: 0 .  phenylephrine (NEO-SYNEPHRINE) 1 % nasal spray, Place 1 drop into  both nostrils daily as needed for congestion., Disp: , Rfl:  .  ramipril (ALTACE) 10 MG capsule, Take 10 mg by mouth daily., Disp: , Rfl:  .  sulfaSALAzine (AZULFIDINE) 500 MG tablet, Take 500 mg by mouth 2 (two) times daily., Disp: , Rfl:  .  gabapentin (NEURONTIN) 300 MG capsule, 300 mg nightly for 2 weeks, then 300 mg twice daily for 2 weeks.  If no side effects can increase to 300 mg 3 times daily., Disp: 90 capsule, Rfl: 1 .  methocarbamol (ROBAXIN) 500 MG tablet, Take 1 tablet (500 mg total) every 6 (six) hours as needed by mouth for muscle spasms. (Patient not taking: Reported on 09/27/2017), Disp: 120 tablet, Rfl: 0  Imaging Review  Lumbosacral Imaging: Lumbar MR wo contrast:  Results for orders placed during the hospital encounter of 08/29/17  MR LUMBAR SPINE WO CONTRAST   Narrative CLINICAL DATA:  Lumbar stenosis with neurogenic claudication.  Lumbar decompression 05/30/2017. Low back pain with left leg pain  EXAM: MRI LUMBAR SPINE WITHOUT CONTRAST  TECHNIQUE: Multiplanar, multisequence MR imaging of the lumbar spine was performed. No intravenous contrast was administered.  COMPARISON:  Lumbar MRI 02/06/2017  FINDINGS: Segmentation:  Normal  Alignment:  Normal  Vertebrae:  Normal  Conus medullaris and cauda equina: Conus extends to the L1-2 level. Conus and cauda equina appear normal.  Paraspinal and other soft tissues: Negative for mass or adenopathy. No fluid collection.  Disc levels:  L1-2: Mild disc bulging without stenosis  L2-3: Mild disc bulging and mild facet degeneration without significant stenosis.  L3-4: Interval laminotomy on the left. Negative for spinal stenosis. Mild disc degeneration and disc bulging. Mild facet degeneration.  L4-5: Disc degeneration and diffuse disc bulging unchanged. Moderate facet hypertrophy unchanged. Mild to moderate spinal stenosis unchanged. Neural foramina patent  L5-S1: Disc degeneration and diffuse endplate spurring. Bilateral facet hypertrophy. Subarticular stenosis bilaterally left greater than right unchanged from the prior study. Small left-sided synovial cyst and ligament thickening unchanged from the prior study.  IMPRESSION: Interval laminotomy on the left at L3-4 with decompression of the canal  Mild to moderate spinal stenosis at L4-5 unchanged  Subarticular stenosis left greater than right at L5-S1 unchanged from the prior study.   Electronically Signed   By: Franchot Gallo M.D.   On: 08/29/2017 15:56    Lumbar DG 2-3 views:  Results for orders placed during the hospital encounter of 05/30/17  DG Lumbar Spine 2-3 Views   Narrative CLINICAL DATA:  Posterior decompression  EXAM: DG C-ARM 61-120 MIN; LUMBAR SPINE - 2-3 VIEW  COMPARISON:  None  FLUOROSCOPY TIME:  20 seconds  FINDINGS: Six intraoperative fluoroscopic spot images are  provided. Posterior tissue retractors are noted on image 4.  IMPRESSION: Intraoperative localization for posterior lumbar decompression.   Electronically Signed   By: Kathreen Devoid   On: 05/30/2017 15:34     Complexity Note: Imaging results reviewed. Results shared with Ms. Peugh, using Layman's terms.                         ROS  Cardiovascular History: High blood pressure Pulmonary or Respiratory History: Smoking, Snoring  and Coughing up mucus (Bronchitis) Neurological History: Stroke (Residual deficits or weakness: unknown) Review of Past Neurological Studies: No results found for this or any previous visit. Psychological-Psychiatric History: Depressed Gastrointestinal History: Vomiting blood (Ulcers) and Reflux or heatburn Genitourinary History: No reported renal or genitourinary signs or symptoms such as difficulty voiding  or producing urine, peeing blood, non-functioning kidney, kidney stones, difficulty emptying the bladder, difficulty controlling the flow of urine, or chronic kidney disease Hematological History: No reported hematological signs or symptoms such as prolonged bleeding, low or poor functioning platelets, bruising or bleeding easily, hereditary bleeding problems, low energy levels due to low hemoglobin or being anemic Endocrine History: No reported endocrine signs or symptoms such as high or low blood sugar, rapid heart rate due to high thyroid levels, obesity or weight gain due to slow thyroid or thyroid disease Rheumatologic History: Joint aches and or swelling due to excess weight (Osteoarthritis) Musculoskeletal History: Negative for myasthenia gravis, muscular dystrophy, multiple sclerosis or malignant hyperthermia Work History: Retired  Allergies  Ms. Flinders is allergic to sulfa antibiotics.  Laboratory Chemistry  Inflammation Markers (CRP: Acute Phase) (ESR: Chronic Phase) No results found for: CRP, ESRSEDRATE, LATICACIDVEN                        Rheumatology Markers No results found for: RF, ANA, [facility], LYMEIGGIGMAB, [facility]              Renal Function Markers Lab Results  Component Value Date   BUN 13 05/28/2017   CREATININE 0.78 05/28/2017   GFRAA >60 05/28/2017   GFRNONAA >60 05/28/2017                 Hepatic Function Markers No results found for: AST, ALT, ALBUMIN, ALKPHOS, HCVAB, AMYLASE, LIPASE, AMMONIA               Electrolytes Lab Results  Component Value Date   NA 134 (L) 05/28/2017   K 3.8 05/28/2017   CL 96 (L) 05/28/2017   CALCIUM 9.3 05/28/2017                        Neuropathy Markers No results found for: VITAMINB12, FOLATE, HGBA1C, HIV               Bone Pathology Markers No results found for: VD25OH, [id number], [id number], [id number], 25OHVITD1, 25OHVITD2, 25OHVITD3, TESTOFREE, TESTOSTERONE                       Coagulation Parameters Lab Results  Component Value Date   INR 1.06 05/28/2017   LABPROT 13.7 05/28/2017   APTT 31 05/28/2017   PLT 394 05/28/2017                 Cardiovascular Markers Lab Results  Component Value Date   HGB 12.4 05/28/2017   HCT 37.1 05/28/2017                 CA Markers No results found for: CEA, CA125, LABCA2               Note: Lab results reviewed.  PFSH  Drug: Ms. Lindo  reports that she does not use drugs. Alcohol:  reports that she drinks alcohol. Tobacco:  reports that she has been smoking.  She has a 49.00 pack-year smoking history. she has never used smokeless tobacco. Medical:  has a past medical history of Anxiety, Arthritis, Back pain, Colitis, ulcerative ([place]), COPD (chronic obstructive pulmonary disease) ([place]), Depression, GERD (gastroesophageal reflux disease), Headache, Hypertension, Motion sickness, Stroke ([place]) (2015), TIA (transient ischemic attack) (2015), Urinary frequency, and Wears dentures. Family: family history includes COPD in her father; Diabetes in her mother; Heart disease in her father; Hypertension in her  father and mother.  Past Surgical History:  Procedure Laterality Date  . ABDOMINAL HYSTERECTOMY     BSO  . ANKLE ARTHROSCOPY Left 03/22/2016   Procedure: ANKLE ARTHROSCOPY DEBRODEMENT EXTENSIVE LEFT FLEXAR TENDON REPAIR;  Surgeon: [name], DPM;  Location: [facility];  Service: Podiatry;  Laterality: Left;  WITH POPLITEAL  . BLADDER SUSPENSION     tac  . CHOLECYSTECTOMY    . COLONOSCOPY    . DIAGNOSTIC LAPAROSCOPY     adhesions  . KNEE ARTHROSCOPY Left 02/13/2014   Procedure: LEFT ARTHROSCOPY KNEE, PARTIAL MEDIAL MENISECTOMY AND PLICA, CHONDROPLASTY OF PATELLA FEMORAL JOINT;  Surgeon: Alta Corning, MD;  Location: [facility];  Service: Orthopedics;  Laterality: Left;  . LUMBAR LAMINECTOMY/DECOMPRESSION MICRODISCECTOMY N/A 05/30/2017   Procedure: LUMBAR LAMINECTOMY/DECOMPRESSION MICRODISCECTOMY 3 LEVELS-L3-S1;  Surgeon: Meade Maw, MD;  Location: [facility];  Service: Neurosurgery;  Laterality: N/A;  . TENDON REPAIR Left 03/22/2016   Procedure: [place];  Surgeon: [name], DPM;  Location: [facility];  Service: Podiatry;  Laterality: Left;  . TONSILLECTOMY    . UPPER GI ENDOSCOPY     Active Ambulatory Problems    Diagnosis Date Noted  . Neurogenic claudication 05/30/2017  . Spinal stenosis, lumbar region, with neurogenic claudication 09/27/2017  . Lumbar radiculopathy 09/27/2017  . Lumbar degenerative disc disease 09/27/2017  . Lumbar facet arthropathy 09/27/2017  . Chronic pain syndrome 09/27/2017   Resolved Ambulatory Problems    Diagnosis Date Noted  . No Resolved Ambulatory Problems   Past Medical History:  Diagnosis Date  . Anxiety   . Arthritis   . Back pain   . Colitis, ulcerative ([place])   . COPD (chronic obstructive pulmonary disease) ([place])   . Depression   . GERD (gastroesophageal reflux disease)   . Headache   . Hypertension   . Motion sickness   . Stroke ([place]) 2015  . TIA (transient ischemic attack)  2015  . Urinary frequency   . Wears dentures    Constitutional Exam  General appearance: Well nourished, well developed, and well hydrated. In no apparent acute distress Vitals:   09/27/17 1304  BP: (!) 149/87  Pulse: 72  Resp: 16  Temp: 97.8 F (36.6 C)  SpO2: 100%  Weight: 220 lb (99.8 kg)  Height: 5' 6"  (1.676 m)   BMI Assessment: Estimated body mass index is 35.51 kg/m as calculated from the following:   Height as of this encounter: 5' 6"  (1.676 m).   Weight as of this encounter: 220 lb (99.8 kg).  BMI interpretation table: BMI level Category Range association with higher incidence of chronic pain  <18 kg/m2 Underweight   18.5-24.9 kg/m2 Ideal body weight   25-29.9 kg/m2 Overweight Increased incidence by 20%  30-34.9 kg/m2 Obese (Class I) Increased incidence by 68%  35-39.9 kg/m2 Severe obesity (Class II) Increased incidence by 136%  >40 kg/m2 Extreme obesity (Class III) Increased incidence by 254%   BMI Readings from Last 4 Encounters:  09/27/17 35.51 kg/m  05/30/17 36.32 kg/m  05/28/17 36.32 kg/m  03/22/16 36.64 kg/m   Wt Readings from Last 4 Encounters:  09/27/17 220 lb (99.8 kg)  05/30/17 225 lb (102.1 kg)  05/28/17 225 lb (102.1 kg)  03/22/16 227 lb (103 kg)  Psych/Mental status: Alert, oriented x 3 (person, place, & time)       Eyes: PERLA Respiratory: No evidence of acute respiratory distress  Cervical Spine Area Exam  Skin & Axial Inspection: No masses, redness, edema, swelling, or associated skin  lesions Alignment: Symmetrical Functional ROM: Unrestricted ROM      Stability: No instability detected Muscle Tone/Strength: Functionally intact. No obvious neuro-muscular anomalies detected. Sensory (Neurological): Unimpaired Palpation: No palpable anomalies              Upper Extremity (UE) Exam    Side: Right upper extremity  Side: Left upper extremity  Skin & Extremity Inspection: Skin color, temperature, and hair growth are WNL. No peripheral  edema or cyanosis. No masses, redness, swelling, asymmetry, or associated skin lesions. No contractures.  Skin & Extremity Inspection: Skin color, temperature, and hair growth are WNL. No peripheral edema or cyanosis. No masses, redness, swelling, asymmetry, or associated skin lesions. No contractures.  Functional ROM: Unrestricted ROM          Functional ROM: Unrestricted ROM          Muscle Tone/Strength: Functionally intact. No obvious neuro-muscular anomalies detected.  Muscle Tone/Strength: Functionally intact. No obvious neuro-muscular anomalies detected.  Sensory (Neurological): Unimpaired          Sensory (Neurological): Unimpaired          Palpation: No palpable anomalies              Palpation: No palpable anomalies              Specialized Test(s): Deferred         Specialized Test(s): Deferred          Thoracic Spine Area Exam  Skin & Axial Inspection: No masses, redness, or swelling Alignment: Symmetrical Functional ROM: Unrestricted ROM Stability: No instability detected Muscle Tone/Strength: Functionally intact. No obvious neuro-muscular anomalies detected. Sensory (Neurological): Unimpaired Muscle strength & Tone: No palpable anomalies  Lumbar Spine Area Exam  Skin & Axial Inspection: Well healed scar from previous spine surgery detected Alignment: Symmetrical Functional ROM: Decreased ROM, bilaterally Stability: No instability detected Muscle Tone/Strength: Functionally intact. No obvious neuro-muscular anomalies detected. Sensory (Neurological): Dermatomal pain pattern Palpation: Complains of area being tender to palpation Bilateral Fist Percussion Test Provocative Tests: Lumbar Hyperextension and rotation test: Positive bilaterally for facet joint pain. Lumbar Lateral bending test: Positive ipsilateral radicular pain, bilaterally. Positive for bilateral foraminal stenosis. Patrick's Maneuver: Positive for bilateral S-I arthralgia              Gait & Posture Assessment   Ambulation: Patient ambulates using a cane Gait: Limited. Using assistive device to ambulate Posture: Difficulty standing up straight, due to pain   Lower Extremity Exam    Side: Right lower extremity  Side: Left lower extremity  Skin & Extremity Inspection: Skin color, temperature, and hair growth are WNL. No peripheral edema or cyanosis. No masses, redness, swelling, asymmetry, or associated skin lesions. No contractures.  Skin & Extremity Inspection: Skin color, temperature, and hair growth are WNL. No peripheral edema or cyanosis. No masses, redness, swelling, asymmetry, or associated skin lesions. No contractures.  Functional ROM: Unrestricted ROM          Functional ROM: Unrestricted ROM          Muscle Tone/Strength: Movement possible against some resistance (4/5)  Muscle Tone/Strength: Movement possible against some resistance (4/5)  Sensory (Neurological): Unimpaired  Sensory (Neurological): Unimpaired  Palpation: No palpable anomalies  Palpation: No palpable anomalies  4, out of 5 strength bilateral lower extremity: Plantar flexion, dorsiflexion, knee flexion, knee extension.  Assessment  Primary Diagnosis & Pertinent Problem List: The primary encounter diagnosis was Neurogenic claudication. Diagnoses of Spinal stenosis, lumbar region, with neurogenic claudication, Lumbar radiculopathy,  Lumbar degenerative disc disease, Lumbar facet arthropathy, and Chronic pain syndrome were also pertinent to this visit.  Visit Diagnosis (New problems to examiner): 1. Neurogenic claudication   2. Spinal stenosis, lumbar region, with neurogenic claudication   3. Lumbar radiculopathy   4. Lumbar degenerative disc disease   5. Lumbar facet arthropathy   6. Chronic pain syndrome   General Recommendations: The pain condition that the patient suffers from is best treated with a multidisciplinary approach that involves an increase in physical activity to prevent de-conditioning and worsening of the pain  cycle, as well as psychological counseling (formal and/or informal) to address the co-morbid psychological affects of pain. Treatment will often involve judicious use of pain medications and interventional procedures to decrease the pain, allowing the patient to participate in the physical activity that will ultimately produce long-lasting pain reductions. The goal of the multidisciplinary approach is to return the patient to a higher level of overall function and to restore their ability to perform activities of daily living.  Patient is a very pleasant 67 year old female who presents with a chief complaint of axial low back pain that radiates down into her bilateral buttocks and hips status post L3-S1 lumbar laminectomy and decompression.  She has had ongoing axial low back pain along with weakness of her lower extremities secondary to spinal stenosis and neurogenic claudication.  Repeat spine MRI after surgery does not show any new or worsening compression.  Nerve conduction shows evidence of chronic L4, L5 radiculopathy.  She endorses numbness and tingling in her lower extremities and finds it difficult to walk for an extended period of time.   I reviewed the patient's MRI and also discussed possible therapeutic options.  We discussed possible spinal cord stimulator trial for her persistent radicular pain.  Patient wants to hold off on this currently which is reasonable.  I did provide her with online resources that she can reference to get better understanding of this therapy so he can have an informed discussion in the future.  There are many non-opioid analgesics specifically neuropathic agents and membrane stabilizers that the patient has not utilized including gabapentin, Lyrica, amitriptyline, nortriptyline, Cymbalta.  Can consider medication trials with these in the future.  She also endorses spasms of her lumbar paraspinal muscles and she has not tried baclofen or tizanidine.  She has been on Robaxin  in the past and finds it somewhat helpful.  Patient wants to avoid opioid therapy if at all possible.  She states that the oxycodone does not help to a great extent and she does not like how it makes her feel.  This is very reasonable.  We will trial non-opioid analgesics and can discuss additional interventional options such as caudal epidural steroid injection versus spinal cord stimulator trial in the future.  Plan: -UDS today. -Gabapentin 300 mg nightly for 2 weeks then 300 mg twice daily for 2 weeks then 300 mg 3 times daily if no side effects -Follow-up in 4 weeks for medication management.  Note: Please be advised that as per protocol, today's visit has been an evaluation only. We have not taken over the patient's controlled substance management.  Ordered Lab-work, Procedure(s), Referral(s), & Consult(s): Orders Placed This Encounter  Procedures  . Compliance Drug Analysis, Ur   Pharmacotherapy (current): Medications ordered:  Meds ordered this encounter  Medications  . gabapentin (NEURONTIN) 300 MG capsule    Sig: 300 mg nightly for 2 weeks, then 300 mg twice daily for 2 weeks.  If no side  effects can increase to 300 mg 3 times daily.    Dispense:  90 capsule    Refill:  1    Do not place this medication, or any other prescription from our practice, on "Automatic Refill". Patient may have prescription filled one day early if pharmacy is closed on scheduled refill date.   Medications administered during this visit: Kaci D. Sinopoli had no medications administered during this visit.   Pharmacological management options:  Opioid Analgesics: The patient was informed that there is no guarantee that she would be a candidate for opioid analgesics. The decision will be made following CDC guidelines. This decision will be based on the results of diagnostic studies, as well as Ms. Etheridge's risk profile.  Consider hydrocodone 5 mg 3 times daily as needed, oxycodone 5 mg twice daily to 3 times  daily as needed  Membrane stabilizer: To be determined at a later time however can trial gabapentin, Lyrica, amitriptyline, nortriptyline, Cymbalta.  Muscle relaxant: To be determined at a later time has tried Robaxin 500 mg twice daily which she found helpful.  Can consider baclofen, tizanidine in the future  NSAID: To be determined at a later time  Other analgesic(s): To be determined at a later time   Interventional management options: Ms. Gradilla was informed that there is no guarantee that she would be a candidate for interventional therapies. The decision will be based on the results of diagnostic studies, as well as Ms. Dimascio's risk profile.  Procedure(s) under consideration:  -Caudal epidural steroid injection -Bilateral SI joint block -Spinal cord stimulator trial   Provider-requested follow-up: Return in about 4 weeks (around 10/25/2017) for Medication Management.  Future Appointments  Date Time Provider [place]  10/25/2017  1:30 PM Gillis Santa, MD [facility] None    Primary Care Physician: Guadlupe Spanish, MD Location: [facility] Note by: Gillis Santa, M.D, Date: 09/27/2017; Time: 2:46 PM  Patient Instructions  1.  Gabapentin 300 mg nightly times 2 weeks then 300 mg twice daily for 2 weeks.  If no improvement in pain at 300 mg twice daily and as long as you are not having any side effects such as sedation, leg swelling, can increase to 300 mg 3 times daily.

## 2017-09-27 NOTE — Progress Notes (Signed)
Safety precautions to be maintained throughout the outpatient stay will include: orient to surroundings, keep bed in low position, maintain call bell within reach at all times, provide assistance with transfer out of bed and ambulation.  

## 2017-09-27 NOTE — Patient Instructions (Signed)
1.  Gabapentin 300 mg nightly times 2 weeks then 300 mg twice daily for 2 weeks.  If no improvement in pain at 300 mg twice daily and as long as you are not having any side effects such as sedation, leg swelling, can increase to 300 mg 3 times daily.

## 2017-10-04 LAB — COMPLIANCE DRUG ANALYSIS, UR

## 2017-10-25 ENCOUNTER — Ambulatory Visit
Payer: Medicare Other | Attending: Student in an Organized Health Care Education/Training Program | Admitting: Student in an Organized Health Care Education/Training Program

## 2017-10-25 ENCOUNTER — Encounter: Payer: Self-pay | Admitting: Student in an Organized Health Care Education/Training Program

## 2017-10-25 VITALS — BP 136/92 | HR 65 | Temp 98.4°F | Resp 16 | Ht 66.0 in | Wt 218.0 lb

## 2017-10-25 DIAGNOSIS — G9519 Other vascular myelopathies: Secondary | ICD-10-CM

## 2017-10-25 DIAGNOSIS — K219 Gastro-esophageal reflux disease without esophagitis: Secondary | ICD-10-CM | POA: Diagnosis not present

## 2017-10-25 DIAGNOSIS — F419 Anxiety disorder, unspecified: Secondary | ICD-10-CM | POA: Diagnosis not present

## 2017-10-25 DIAGNOSIS — I10 Essential (primary) hypertension: Secondary | ICD-10-CM | POA: Insufficient documentation

## 2017-10-25 DIAGNOSIS — M5136 Other intervertebral disc degeneration, lumbar region: Secondary | ICD-10-CM

## 2017-10-25 DIAGNOSIS — Z8673 Personal history of transient ischemic attack (TIA), and cerebral infarction without residual deficits: Secondary | ICD-10-CM | POA: Insufficient documentation

## 2017-10-25 DIAGNOSIS — Z79899 Other long term (current) drug therapy: Secondary | ICD-10-CM | POA: Diagnosis not present

## 2017-10-25 DIAGNOSIS — Z882 Allergy status to sulfonamides status: Secondary | ICD-10-CM | POA: Diagnosis not present

## 2017-10-25 DIAGNOSIS — M48062 Spinal stenosis, lumbar region with neurogenic claudication: Secondary | ICD-10-CM | POA: Diagnosis not present

## 2017-10-25 DIAGNOSIS — M51369 Other intervertebral disc degeneration, lumbar region without mention of lumbar back pain or lower extremity pain: Secondary | ICD-10-CM

## 2017-10-25 DIAGNOSIS — M5416 Radiculopathy, lumbar region: Secondary | ICD-10-CM | POA: Diagnosis not present

## 2017-10-25 DIAGNOSIS — G894 Chronic pain syndrome: Secondary | ICD-10-CM

## 2017-10-25 DIAGNOSIS — M5116 Intervertebral disc disorders with radiculopathy, lumbar region: Secondary | ICD-10-CM | POA: Diagnosis not present

## 2017-10-25 DIAGNOSIS — Z87891 Personal history of nicotine dependence: Secondary | ICD-10-CM | POA: Diagnosis not present

## 2017-10-25 DIAGNOSIS — M47816 Spondylosis without myelopathy or radiculopathy, lumbar region: Secondary | ICD-10-CM | POA: Diagnosis not present

## 2017-10-25 DIAGNOSIS — R29818 Other symptoms and signs involving the nervous system: Secondary | ICD-10-CM

## 2017-10-25 DIAGNOSIS — F329 Major depressive disorder, single episode, unspecified: Secondary | ICD-10-CM | POA: Diagnosis not present

## 2017-10-25 MED ORDER — HYDROCODONE-ACETAMINOPHEN 5-325 MG PO TABS: 1.0000 | ORAL_TABLET | Freq: Three times a day (TID) | ORAL | 0 refills | Status: DC | PRN

## 2017-10-25 NOTE — Progress Notes (Signed)
Patient's Name: Tammy Lamb  MRN: [id number]  Referring Provider: Guadlupe Spanish, MD  DOB: Dec 11, 1950  PCP: Guadlupe Spanish, MD  DOS: 10/25/2017  Note by: Gillis Santa, MD  Service setting: Ambulatory outpatient  Specialty: Interventional Pain Management  Location: [facility] (AMB) Pain Management Facility    Patient type: Established   Primary Reason(s) for Visit: Encounter for evaluation before starting new chronic pain management plan of care (Level of risk: moderate) CC: Back Pain (lower left )  HPI  Tammy Lamb is a 67 y.o. year old, female patient, who comes today for a follow-up evaluation to review the test results and decide on a treatment plan. She has Neurogenic claudication; Spinal stenosis, lumbar region, with neurogenic claudication; Lumbar radiculopathy; Lumbar degenerative disc disease; Lumbar facet arthropathy; and Chronic pain syndrome on their problem list. Her primarily concern today is the Back Pain (lower left )  Pain Assessment: Location: Lower, Left Back Radiating: occassionally into the left hip Onset: More than a month ago Duration: Chronic pain Quality: Numbness, Burning, Discomfort, Tender, Nagging, Other (Comment)(pain causes hip or back to catch) Severity: 6 /10 (self-reported pain score)  Note: Reported level is compatible with observation.                         When using our objective Pain Scale, levels between 6 and 10/10 are said to belong in an emergency room, as it progressively worsens from a 6/10, described as severely limiting, requiring emergency care not usually available at an outpatient pain management facility. At a 6/10 level, communication becomes difficult and requires great effort. Assistance to reach the emergency department may be required. Facial flushing and profuse sweating along with potentially dangerous increases in heart rate and blood pressure will be evident. Effect on ADL: affects her gait at times.  back is stiff sometimes.  Timing:  Constant Modifying factors: rest, heat, ice or medications.  Tammy Lamb comes in today for a follow-up visit after her initial evaluation on 09/27/2017. Today we went over the results of her tests. These were explained in "Layman's terms". During today's appointment we went over my diagnostic impression, as well as the proposed treatment plan.  Comes today for 2nd patient visit. Going [place] with her [place] next week and is anxious about 4 hr drive there. Endorsing benefit with Gabapentin currently at 300 mg BID, will increase to 300 mg TID tonight.  States that Oxycodone "makes her crazy" and results insomnia.  In considering the treatment plan options, Ms. Handa was reminded that I no longer take patients for medication management only. I asked her to let me know if she had no intention of taking advantage of the interventional therapies, so that we could make arrangements to provide this space to someone interested. I also made it clear that undergoing interventional therapies for the purpose of getting pain medications is very inappropriate on the part of a patient, and it will not be tolerated in this practice. This type of behavior would suggest true addiction and therefore it requires referral to an addiction specialist.   Further details on both, my assessment(s), as well as the proposed treatment plan, please see below.  Controlled Substance Pharmacotherapy Assessment REMS (Risk Evaluation and Mitigation Strategy)  Analgesic: start Hydrocodone 5 mg TID prn MME/day: 15 mg/day. Pill Count: None expected due to no prior prescriptions written by our practice. Janett Billow, RN  10/25/2017  1:30 PM  Sign at close  encounter Safety precautions to be maintained throughout the outpatient stay will include: orient to surroundings, keep bed in low position, maintain call bell within reach at all times, provide assistance with transfer out of bed and ambulation.     Pharmacokinetics: Liberation and absorption (onset of action): WNL Distribution (time to peak effect): WNL Metabolism and excretion (duration of action): WNL         Pharmacodynamics: Desired effects: Analgesia: Tammy Lamb reports >50% benefit. Functional ability: Patient reports that medication allows her to accomplish basic ADLs Clinically meaningful improvement in function (CMIF): Sustained CMIF goals met Perceived effectiveness: Described as relatively effective, allowing for increase in activities of daily living (ADL) Undesirable effects: Side-effects or Adverse reactions: None reported Monitoring: [place] PMP: Online review of the past 49-monthperiod previously conducted. Not applicable at this point since we have not taken over the patient's medication management yet. List of other Serum/Urine Drug Screening Test(s):  No results found for: AMPHSCRSER, BARBSCRSER, BENZOSCRSER, COCAINSCRSER, COCAINSCRNUR, PCPSCRSER, THCSCRSER, THCU, CANNABQUANT, [place] E[place] of all UDS test(s) done:  Lab Results  Component Value Date   SUMMARY FINAL 09/27/2017   Last UDS on record: Summary  Date Value Ref Range Status  09/27/2017 FINAL  Final    Comment:    ==================================================================== TOXASSURE COMP DRUG ANALYSIS,UR ==================================================================== Test                             Result       Flag       Units Drug Present and Declared for Prescription Verification   Noroxycodone                   193          EXPECTED   ng/mg creat    Noroxycodone is an expected metabolite of oxycodone. Sources of    oxycodone include scheduled prescription medications.   Citalopram                     PRESENT      EXPECTED   Desmethylcitalopram            PRESENT      EXPECTED    Desmethylcitalopram is an expected metabolite of citalopram or    the enantiomeric form, escitalopram.   Acetaminophen                   PRESENT      EXPECTED Drug Present not Declared for Prescription Verification   Salicylate                     PRESENT      UNEXPECTED Drug Absent but Declared for Prescription Verification   Oxycodone                      Not Detected UNEXPECTED ng/mg creat    Oxycodone is almost always present in patients taking this drug    consistently.  Absence of oxycodone could be due to lapse of time    since the last dose or unusual pharmacokinetics (rapid    metabolism).   Gabapentin                     Not Detected UNEXPECTED   Methocarbamol                  Not Detected UNEXPECTED ==================================================================== Test  Result    Flag   Units      Ref Range   Creatinine              30               mg/dL      >=20 ==================================================================== Declared Medications:  The flagging and interpretation on this report are based on the  following declared medications.  Unexpected results may arise from  inaccuracies in the declared medications.  **Note: The testing scope of this panel includes these medications:  Citalopram (Celexa)  Gabapentin  Methocarbamol (Robaxin)  Oxycodone  **Note: The testing scope of this panel does not include small to  moderate amounts of these reported medications:  Acetaminophen  **Note: The testing scope of this panel does not include following  reported medications:  Atorvastatin (Lipitor)  Carvedilol (Coreg)  Hydrochlorothiazide (Hydrodiuril)  Melatonin  Omeprazole (Zegerid)  Phenylephrine  Ramipril (Altace)  Sodium Bicarbonate (Zegerid)  Sulfasalazine (Azulfidine)  Vitamin D3 ==================================================================== For clinical consultation, please call [phone]. ====================================================================    UDS interpretation: No unexpected findings.          Medication Assessment  Form: Patient introduced to form today Treatment compliance: Treatment may start today if patient agrees with proposed plan. Evaluation of compliance is not applicable at this point Risk Assessment Profile: Aberrant behavior: See initial evaluations. None observed or detected today Comorbid factors increasing risk of overdose: See initial evaluation. No additional risks detected today Medical Psychology Evaluation: Please see scanned results in medical record. Opioid Risk Tool - 09/27/17 1321      Family History of Substance Abuse   Alcohol  Negative    Illegal Drugs  Negative    Rx Drugs  Negative      Personal History of Substance Abuse   Alcohol  Negative    Illegal Drugs  Negative    Rx Drugs  Negative      Psychological Disease   Psychological Disease  Negative    Depression  Positive      Total Score   Opioid Risk Tool Scoring  1    Opioid Risk Interpretation  Low Risk      ORT Scoring interpretation table:  Score <3 = Low Risk for SUD  Score between 4-7 = Moderate Risk for SUD  Score >8 = High Risk for Opioid Abuse   Risk Mitigation Strategies:  Patient opioid safety counseling: Completed today. Counseling provided to patient as per "Patient Counseling Document". Document signed by patient, attesting to counseling and understanding Patient-Prescriber Agreement (PPA): Obtained today.  Controlled substance notification to other providers: Written and sent today.  Pharmacologic Plan: Today we may be taking over the patient's pharmacological regimen. See below.             Laboratory Chemistry  Inflammation Markers (CRP: Acute Phase) (ESR: Chronic Phase) No results found for: CRP, ESRSEDRATE, LATICACIDVEN                       Rheumatology Markers No results found for: RF, ANA, Therisa Doyne, [facility]                      Renal Function Markers Lab Results  Component Value Date   BUN 13 05/28/2017   CREATININE 0.78 05/28/2017   GFRAA >60  05/28/2017   GFRNONAA >60 05/28/2017  Hepatic Function Markers No results found for: AST, ALT, ALBUMIN, ALKPHOS, HCVAB, AMYLASE, LIPASE, AMMONIA                      Electrolytes Lab Results  Component Value Date   NA 134 (L) 05/28/2017   K 3.8 05/28/2017   CL 96 (L) 05/28/2017   CALCIUM 9.3 05/28/2017                        Neuropathy Markers No results found for: VITAMINB12, FOLATE, HGBA1C, HIV                      Bone Pathology Markers No results found for: VD25OH, [id number], YT0354SF6, CL2751ZG0, 25OHVITD1, 25OHVITD2, 25OHVITD3, TESTOFREE, TESTOSTERONE                       Coagulation Parameters Lab Results  Component Value Date   INR 1.06 05/28/2017   LABPROT 13.7 05/28/2017   APTT 31 05/28/2017   PLT 394 05/28/2017                        Cardiovascular Markers Lab Results  Component Value Date   HGB 12.4 05/28/2017   HCT 37.1 05/28/2017                         CA Markers No results found for: CEA, CA125, LABCA2                      Note: Lab results reviewed.  Recent Diagnostic Imaging Review   Lumbosacral Imaging: Lumbar MR wo contrast:  Results for orders placed during the hospital encounter of 08/29/17  MR LUMBAR SPINE WO CONTRAST   Narrative CLINICAL DATA:  Lumbar stenosis with neurogenic claudication. Lumbar decompression 05/30/2017. Low back pain with left leg pain  EXAM: MRI LUMBAR SPINE WITHOUT CONTRAST  TECHNIQUE: Multiplanar, multisequence MR imaging of the lumbar spine was performed. No intravenous contrast was administered.  COMPARISON:  Lumbar MRI 02/06/2017  FINDINGS: Segmentation:  Normal  Alignment:  Normal  Vertebrae:  Normal  Conus medullaris and cauda equina: Conus extends to the L1-2 level. Conus and cauda equina appear normal.  Paraspinal and other soft tissues: Negative for mass or adenopathy. No fluid collection.  Disc levels:  L1-2: Mild disc bulging without stenosis  L2-3: Mild  disc bulging and mild facet degeneration without significant stenosis.  L3-4: Interval laminotomy on the left. Negative for spinal stenosis. Mild disc degeneration and disc bulging. Mild facet degeneration.  L4-5: Disc degeneration and diffuse disc bulging unchanged. Moderate facet hypertrophy unchanged. Mild to moderate spinal stenosis unchanged. Neural foramina patent  L5-S1: Disc degeneration and diffuse endplate spurring. Bilateral facet hypertrophy. Subarticular stenosis bilaterally left greater than right unchanged from the prior study. Small left-sided synovial cyst and ligament thickening unchanged from the prior study.  IMPRESSION: Interval laminotomy on the left at L3-4 with decompression of the canal  Mild to moderate spinal stenosis at L4-5 unchanged  Subarticular stenosis left greater than right at L5-S1 unchanged from the prior study.   Electronically Signed   By: Franchot Gallo M.D.   On: 08/29/2017 15:56     Results for orders placed during the hospital encounter of 05/30/17  DG Lumbar Spine 2-3 Views   Narrative CLINICAL DATA:  Posterior decompression  EXAM: DG C-ARM 61-120 MIN; LUMBAR SPINE -  2-3 VIEW  COMPARISON:  None  FLUOROSCOPY TIME:  20 seconds  FINDINGS: Six intraoperative fluoroscopic spot images are provided. Posterior tissue retractors are noted on image 4.  IMPRESSION: Intraoperative localization for posterior lumbar decompression.   Electronically Signed   By: Kathreen Devoid   On: 05/30/2017 15:34      Complexity Note: Imaging results reviewed. Results shared with Ms. Picking, using Layman's terms.                         Meds   Current Outpatient Medications:  .  acetaminophen (TYLENOL) 500 MG tablet, Take 1,000 mg by mouth at bedtime as needed (pain)., Disp: , Rfl:  .  atorvastatin (LIPITOR) 80 MG tablet, Take 80 mg by mouth daily at 6 PM., Disp: , Rfl:  .  carvedilol (COREG) 25 MG tablet, Take 12.5 mg by mouth 2 (two) times  daily., Disp: , Rfl:  .  Cholecalciferol (VITAMIN D3) 2000 units capsule, Take 2,000 Units by mouth at bedtime., Disp: , Rfl:  .  CINNAMON PO, Take 1,000 mg by mouth at bedtime., Disp: , Rfl:  .  citalopram (CELEXA) 20 MG tablet, Take 20 mg by mouth every evening., Disp: , Rfl:  .  gabapentin (NEURONTIN) 300 MG capsule, 300 mg nightly for 2 weeks, then 300 mg twice daily for 2 weeks.  If no side effects can increase to 300 mg 3 times daily., Disp: 90 capsule, Rfl: 1 .  hydrochlorothiazide (HYDRODIURIL) 25 MG tablet, Take 25 mg by mouth daily., Disp: , Rfl:  .  Melatonin 10 MG TABS, Take 10 mg by mouth at bedtime as needed (sleep)., Disp: , Rfl:  .  Omeprazole-Sodium Bicarbonate (ZEGERID OTC) 20-1100 MG CAPS capsule, Take 1 capsule by mouth 2 (two) times daily., Disp: , Rfl:  .  phenylephrine (NEO-SYNEPHRINE) 1 % nasal spray, Place 1 drop into both nostrils daily as needed for congestion., Disp: , Rfl:  .  ramipril (ALTACE) 10 MG capsule, Take 10 mg by mouth daily., Disp: , Rfl:  .  sulfaSALAzine (AZULFIDINE) 500 MG tablet, Take 500 mg by mouth 2 (two) times daily., Disp: , Rfl:  .  HYDROcodone-acetaminophen (NORCO/VICODIN) 5-325 MG tablet, Take 1 tablet by mouth 3 (three) times daily as needed for moderate pain., Disp: 90 tablet, Rfl: 0 .  methocarbamol (ROBAXIN) 500 MG tablet, Take 1 tablet (500 mg total) every 6 (six) hours as needed by mouth for muscle spasms. (Patient not taking: Reported on 09/27/2017), Disp: 120 tablet, Rfl: 0  ROS  Constitutional: Denies any fever or chills Gastrointestinal: No reported hemesis, hematochezia, vomiting, or acute GI distress Musculoskeletal: Denies any acute onset joint swelling, redness, loss of ROM, or weakness Neurological: No reported episodes of acute onset apraxia, aphasia, dysarthria, agnosia, amnesia, paralysis, loss of coordination, or loss of consciousness  Allergies  Ms. Arquette is allergic to sulfa antibiotics.  PFSH  Drug: Ms. Pienta  reports  that she does not use drugs. Alcohol:  reports that she drinks alcohol. Tobacco:  reports that she has been smoking.  She has a 49.00 pack-year smoking history. She has never used smokeless tobacco. Medical:  has a past medical history of Anxiety, Arthritis, Back pain, Colitis, ulcerative ([place]), COPD (chronic obstructive pulmonary disease) ([place]), Depression, GERD (gastroesophageal reflux disease), Headache, Hypertension, Motion sickness, Stroke ([place]) (2015), TIA (transient ischemic attack) (2015), Urinary frequency, and Wears dentures. Surgical: Ms. Konz  has a past surgical history that includes Tonsillectomy; Cholecystectomy; Abdominal hysterectomy; Diagnostic  laparoscopy; Bladder suspension; Colonoscopy; Upper gi endoscopy; Knee arthroscopy (Left, 02/13/2014); Ankle arthroscopy (Left, 03/22/2016); Tendon repair (Left, 03/22/2016); and Lumbar laminectomy/decompression microdiscectomy (N/A, 05/30/2017). Family: family history includes COPD in her father; Diabetes in her mother; Heart disease in her father; Hypertension in her father and mother.  Constitutional Exam  General appearance: Well nourished, well developed, and well hydrated. In no apparent acute distress Vitals:   10/25/17 1330  BP: (!) 136/92  Pulse: 65  Resp: 16  Temp: 98.4 F (36.9 C)  TempSrc: Oral  SpO2: 99%  Weight: 218 lb (98.9 kg)  Height: 5' 6"  (1.676 m)   BMI Assessment: Estimated body mass index is 35.19 kg/m as calculated from the following:   Height as of this encounter: 5' 6"  (1.676 m).   Weight as of this encounter: 218 lb (98.9 kg).  BMI interpretation table: BMI level Category Range association with higher incidence of chronic pain  <18 kg/m2 Underweight   18.5-24.9 kg/m2 Ideal body weight   25-29.9 kg/m2 Overweight Increased incidence by 20%  30-34.9 kg/m2 Obese (Class I) Increased incidence by 68%  35-39.9 kg/m2 Severe obesity (Class II) Increased incidence by 136%  >40 kg/m2 Extreme obesity (Class III)  Increased incidence by 254%   BMI Readings from Last 4 Encounters:  10/25/17 35.19 kg/m  09/27/17 35.51 kg/m  05/30/17 36.32 kg/m  05/28/17 36.32 kg/m   Wt Readings from Last 4 Encounters:  10/25/17 218 lb (98.9 kg)  09/27/17 220 lb (99.8 kg)  05/30/17 225 lb (102.1 kg)  05/28/17 225 lb (102.1 kg)  Psych/Mental status: Alert, oriented x 3 (person, place, & time)       Eyes: PERLA Respiratory: No evidence of acute respiratory distress  Cervical Spine Area Exam  Skin & Axial Inspection: No masses, redness, edema, swelling, or associated skin lesions Alignment: Symmetrical Functional ROM: Unrestricted ROM      Stability: No instability detected Muscle Tone/Strength: Functionally intact. No obvious neuro-muscular anomalies detected. Sensory (Neurological): Unimpaired Palpation: No palpable anomalies              Upper Extremity (UE) Exam    Side: Right upper extremity  Side: Left upper extremity  Skin & Extremity Inspection: Skin color, temperature, and hair growth are WNL. No peripheral edema or cyanosis. No masses, redness, swelling, asymmetry, or associated skin lesions. No contractures.  Skin & Extremity Inspection: Skin color, temperature, and hair growth are WNL. No peripheral edema or cyanosis. No masses, redness, swelling, asymmetry, or associated skin lesions. No contractures.  Functional ROM: Unrestricted ROM          Functional ROM: Unrestricted ROM          Muscle Tone/Strength: Functionally intact. No obvious neuro-muscular anomalies detected.  Muscle Tone/Strength: Functionally intact. No obvious neuro-muscular anomalies detected.  Sensory (Neurological): Unimpaired          Sensory (Neurological): Unimpaired          Palpation: No palpable anomalies              Palpation: No palpable anomalies              Specialized Test(s): Deferred         Specialized Test(s): Deferred          Thoracic Spine Area Exam  Skin & Axial Inspection: No masses, redness, or  swelling Alignment: Symmetrical Functional ROM: Unrestricted ROM Stability: No instability detected Muscle Tone/Strength: Functionally intact. No obvious neuro-muscular anomalies detected. Sensory (Neurological): Unimpaired Muscle strength & Tone: No  palpable anomalies  Lumbar Spine Area Exam  Skin & Axial Inspection: Well healed scar from previous spine surgery detected Alignment: Symmetrical Functional ROM: Decreased ROM, bilaterally Stability: No instability detected Muscle Tone/Strength: Functionally intact. No obvious neuro-muscular anomalies detected. Sensory (Neurological): Dermatomal pain pattern Palpation: Complains of area being tender to palpation Bilateral Fist Percussion Test Provocative Tests: Lumbar Hyperextension and rotation test: Positive bilaterally for facet joint pain. Lumbar Lateral bending test: Positive ipsilateral radicular pain, bilaterally. Positive for bilateral foraminal stenosis. Patrick's Maneuver: Positive for bilateral S-I arthralgia              Gait & Posture Assessment  Ambulation: Patient ambulates using a cane Gait: Limited. Using assistive device to ambulate Posture: Difficulty standing up straight, due to pain   Lower Extremity Exam    Side: Right lower extremity  Side: Left lower extremity  Skin & Extremity Inspection: Skin color, temperature, and hair growth are WNL. No peripheral edema or cyanosis. No masses, redness, swelling, asymmetry, or associated skin lesions. No contractures.  Skin & Extremity Inspection: Skin color, temperature, and hair growth are WNL. No peripheral edema or cyanosis. No masses, redness, swelling, asymmetry, or associated skin lesions. No contractures.  Functional ROM: Unrestricted ROM          Functional ROM: Unrestricted ROM          Muscle Tone/Strength: Movement possible against some resistance (4/5)  Muscle Tone/Strength: Movement possible against some resistance (4/5)  Sensory (Neurological): Unimpaired   Sensory (Neurological): Unimpaired  Palpation: No palpable anomalies  Palpation: No palpable anomalies  4, out of 5 strength bilateral lower extremity: Plantar flexion, dorsiflexion, knee flexion, knee extension.  Assessment & Plan  Primary Diagnosis & Pertinent Problem List: The primary encounter diagnosis was Neurogenic claudication. Diagnoses of Spinal stenosis, lumbar region, with neurogenic claudication, Lumbar radiculopathy, Lumbar degenerative disc disease, Lumbar facet arthropathy, and Chronic pain syndrome were also pertinent to this visit.  Visit Diagnosis: 1. Neurogenic claudication   2. Spinal stenosis, lumbar region, with neurogenic claudication   3. Lumbar radiculopathy   4. Lumbar degenerative disc disease   5. Lumbar facet arthropathy   6. Chronic pain syndrome    General Recommendations: The pain condition that the patient suffers from is best treated with a multidisciplinary approach that involves an increase in physical activity to prevent de-conditioning and worsening of the pain cycle, as well as psychological counseling (formal and/or informal) to address the co-morbid psychological affects of pain. Treatment will often involve judicious use of pain medications and interventional procedures to decrease the pain, allowing the patient to participate in the physical activity that will ultimately produce long-lasting pain reductions. The goal of the multidisciplinary approach is to return the patient to a higher level of overall function and to restore their ability to perform activities of daily living.  Patient is a very pleasant 67 year old female who presents with a chief complaint of axial low back pain that radiates down into her bilateral buttocks and hips status post L3-S1 lumbar laminectomy and decompression.  She has had ongoing axial low back pain along with weakness of her lower extremities secondary to spinal stenosis and neurogenic claudication.  Repeat spine MRI  after surgery does not show any new or worsening compression.  Nerve conduction shows evidence of chronic L4, L5 radiculopathy.  She endorses numbness and tingling in her lower extremities and finds it difficult to walk for an extended period of time.   I reviewed the patient's MRI and also discussed possible therapeutic  options.  We discussed possible spinal cord stimulator trial for her persistent radicular pain.  Patient wants to hold off on this currently which is reasonable.  I did provide her with online resources that she can reference to get better understanding of this therapy so he can have an informed discussion in the future.  There are many non-opioid analgesics specifically neuropathic agents and membrane stabilizers that the patient has not utilized including gabapentin, Lyrica, amitriptyline, nortriptyline, Cymbalta.  Can consider medication trials with these in the future.  She also endorses spasms of her lumbar paraspinal muscles and she has not tried baclofen or tizanidine.  She has been on Robaxin in the past and finds it somewhat helpful.  Patient returns today for second patient visit.  She is titrating up her gabapentin and finds benefit from head especially for her radicular pain.  She will titrate up to gabapentin 300 mg 3 times daily today.  Patient is [place] with her [place] and is anxious about the 4 Hour drive and her ability to walk around at the festival.  She states that oxycodone results in insomnia and makes her feel "weird".  Patient's PMP was reviewed and appropriate.  UDS reviewed and appropriate.  We will have patient sign opiate contract and start with hydrocodone 5 mg 3 times daily as needed.  Plan: -Sign opioid contract -Continue gabapentin 300 mg 3 times daily -Hydrocodone 5 mg 3 times daily as needed, quantity 96-month  Plan of Care  Pharmacotherapy (Medications Ordered): Meds ordered this encounter  Medications  .  HYDROcodone-acetaminophen (NORCO/VICODIN) 5-325 MG tablet    Sig: Take 1 tablet by mouth 3 (three) times daily as needed for moderate pain.    Dispense:  90 tablet    Refill:  0    Do not place this medication, or any other prescription from our practice, on "Automatic Refill". Patient may have prescription filled one day early if pharmacy is closed on scheduled refill date.  For chronic pain To last for 30 days from fill date   Pharmacological management options:  Opioid Analgesics: We'll take over management today. See above orders Membrane stabilizer: To be determined at a later time however can trial  Lyrica, amitriptyline, nortriptyline, Cymbalta. Currently on GABApentin 300 mg TID  Muscle relaxant: To be determined at a later time has tried Robaxin 500 mg twice daily which she found helpful.  Can consider baclofen, tizanidine in the future  NSAID: To be determined at a later time  Other analgesic(s): To be determined at a later time     Interventional management options: Ms. PLimbertwas informed that there is no guarantee that she would be a candidate for interventional therapies. The decision will be based on the results of diagnostic studies, as well as Ms. [place]'s risk profile.  Procedure(s) under consideration:  -Caudal epidural steroid injection -Bilateral SI joint block -Spinal cord stimulator trial    Provider-requested follow-up: Return in about 1 month (around 11/22/2017) for Medication Management. Time Note: Greater than 50% of the 25 minute(s) of face-to-face time spent with Ms. PEstock was spent in counseling/coordination of care regarding: Ms. PZellnerprimary cause of pain, the treatment plan, treatment alternatives, the risks and possible complications of proposed treatment, medication side effects, going over the informed consent, the opioid analgesic risks and possible complications, the appropriate use of her medications, realistic expectations, the medication agreement  and the patient's responsibilities when it comes to controlled substances. Future Appointments  Date  Time Provider [place]  11/21/2017 10:45 AM Vevelyn Francois, NP [facility] None    Primary Care Physician: Guadlupe Spanish, MD Location: [facility] Note by: Gillis [place], M.D Date: 10/25/2017; Time: 2:39 PM  Patient Instructions  1. Sign opioid contract 2. Rx for Hydrocodone for 1 month

## 2017-10-25 NOTE — Patient Instructions (Signed)
1. Sign opioid contract 2. Rx for Hydrocodone for 1 month

## 2017-10-25 NOTE — Progress Notes (Signed)
Safety precautions to be maintained throughout the outpatient stay will include: orient to surroundings, keep bed in low position, maintain call bell within reach at all times, provide assistance with transfer out of bed and ambulation.  

## 2017-11-06 ENCOUNTER — Telehealth: Payer: Self-pay | Admitting: *Deleted

## 2017-11-06 NOTE — Telephone Encounter (Signed)
Patient noticed this after she started the 3 per day on her titration. Instructed her to go back to 2 times per day on her dosing and to call us if swelling persist or any new concerns. Patient understands instructions.

## 2017-11-21 ENCOUNTER — Encounter: Payer: Medicare Other | Admitting: Nurse Practitioner

## 2017-11-22 ENCOUNTER — Ambulatory Visit
Payer: Medicare Other | Attending: Nurse Practitioner | Admitting: Student in an Organized Health Care Education/Training Program

## 2017-11-22 ENCOUNTER — Encounter: Payer: Self-pay | Admitting: Student in an Organized Health Care Education/Training Program

## 2017-11-22 VITALS — BP 116/90 | HR 60 | Temp 98.4°F | Resp 16 | Ht 66.0 in | Wt 218.0 lb

## 2017-11-22 DIAGNOSIS — M5416 Radiculopathy, lumbar region: Secondary | ICD-10-CM | POA: Diagnosis not present

## 2017-11-22 DIAGNOSIS — Z8673 Personal history of transient ischemic attack (TIA), and cerebral infarction without residual deficits: Secondary | ICD-10-CM | POA: Diagnosis not present

## 2017-11-22 DIAGNOSIS — M48062 Spinal stenosis, lumbar region with neurogenic claudication: Secondary | ICD-10-CM | POA: Diagnosis not present

## 2017-11-22 DIAGNOSIS — Z833 Family history of diabetes mellitus: Secondary | ICD-10-CM | POA: Diagnosis not present

## 2017-11-22 DIAGNOSIS — R531 Weakness: Secondary | ICD-10-CM | POA: Diagnosis not present

## 2017-11-22 DIAGNOSIS — Z882 Allergy status to sulfonamides status: Secondary | ICD-10-CM | POA: Diagnosis not present

## 2017-11-22 DIAGNOSIS — F329 Major depressive disorder, single episode, unspecified: Secondary | ICD-10-CM | POA: Insufficient documentation

## 2017-11-22 DIAGNOSIS — G9519 Other vascular myelopathies: Secondary | ICD-10-CM

## 2017-11-22 DIAGNOSIS — K219 Gastro-esophageal reflux disease without esophagitis: Secondary | ICD-10-CM | POA: Insufficient documentation

## 2017-11-22 DIAGNOSIS — Z79899 Other long term (current) drug therapy: Secondary | ICD-10-CM | POA: Diagnosis not present

## 2017-11-22 DIAGNOSIS — Z79891 Long term (current) use of opiate analgesic: Secondary | ICD-10-CM | POA: Insufficient documentation

## 2017-11-22 DIAGNOSIS — M7989 Other specified soft tissue disorders: Secondary | ICD-10-CM | POA: Diagnosis not present

## 2017-11-22 DIAGNOSIS — Z825 Family history of asthma and other chronic lower respiratory diseases: Secondary | ICD-10-CM | POA: Insufficient documentation

## 2017-11-22 DIAGNOSIS — Z9049 Acquired absence of other specified parts of digestive tract: Secondary | ICD-10-CM | POA: Diagnosis not present

## 2017-11-22 DIAGNOSIS — M5116 Intervertebral disc disorders with radiculopathy, lumbar region: Secondary | ICD-10-CM | POA: Diagnosis not present

## 2017-11-22 DIAGNOSIS — Z9889 Other specified postprocedural states: Secondary | ICD-10-CM | POA: Diagnosis not present

## 2017-11-22 DIAGNOSIS — M5136 Other intervertebral disc degeneration, lumbar region: Secondary | ICD-10-CM | POA: Diagnosis not present

## 2017-11-22 DIAGNOSIS — I10 Essential (primary) hypertension: Secondary | ICD-10-CM | POA: Insufficient documentation

## 2017-11-22 DIAGNOSIS — F419 Anxiety disorder, unspecified: Secondary | ICD-10-CM | POA: Diagnosis not present

## 2017-11-22 DIAGNOSIS — G894 Chronic pain syndrome: Secondary | ICD-10-CM | POA: Diagnosis not present

## 2017-11-22 DIAGNOSIS — Z8249 Family history of ischemic heart disease and other diseases of the circulatory system: Secondary | ICD-10-CM | POA: Insufficient documentation

## 2017-11-22 DIAGNOSIS — Z87891 Personal history of nicotine dependence: Secondary | ICD-10-CM | POA: Insufficient documentation

## 2017-11-22 DIAGNOSIS — M47816 Spondylosis without myelopathy or radiculopathy, lumbar region: Secondary | ICD-10-CM

## 2017-11-22 MED ORDER — GABAPENTIN 300 MG PO CAPS: 300.0000 mg | ORAL_CAPSULE | Freq: Two times a day (BID) | ORAL | 2 refills | Status: DC

## 2017-11-22 MED ORDER — ORPHENADRINE CITRATE 30 MG/ML IJ SOLN
30.0000 mg | Freq: Once | INTRAMUSCULAR | Status: AC
  Administered 2017-11-22: 30 mg via INTRAMUSCULAR

## 2017-11-22 MED ORDER — ORPHENADRINE CITRATE 30 MG/ML IJ SOLN
INTRAMUSCULAR | Status: AC
  Filled 2017-11-22: qty 2

## 2017-11-22 MED ORDER — KETOROLAC TROMETHAMINE 30 MG/ML IJ SOLN
30.0000 mg | Freq: Once | INTRAMUSCULAR | Status: AC
  Administered 2017-11-22: 30 mg via INTRAMUSCULAR

## 2017-11-22 MED ORDER — KETOROLAC TROMETHAMINE 30 MG/ML IJ SOLN
INTRAMUSCULAR | Status: AC
  Filled 2017-11-22: qty 1

## 2017-11-22 MED ORDER — AMITRIPTYLINE HCL 25 MG PO TABS: ORAL_TABLET | ORAL | 1 refills | Status: DC

## 2017-11-22 NOTE — Progress Notes (Signed)
Patient's Name: Tammy Lamb  MRN: [id number]  Referring Provider: Guadlupe Spanish, MD  DOB: 1951-02-08  PCP: Guadlupe Spanish, MD  DOS: 11/22/2017  Note by: Gillis Santa, MD  Service setting: Ambulatory outpatient  Specialty: Interventional Pain Management  Location: [facility] (AMB) Pain Management Facility    Patient type: Established   Primary Reason(s) for Visit: Encounter for prescription drug management. (Level of risk: moderate)  CC: Back Pain (lower, left is worse )  HPI  Ms. Whiteman is a 67 y.o. year old, female patient, who comes today for a medication management evaluation. She has Neurogenic claudication; Spinal stenosis, lumbar region, with neurogenic claudication; Lumbar radiculopathy; Lumbar degenerative disc disease; Lumbar facet arthropathy; and Chronic pain syndrome on their problem list. Her primarily concern today is the Back Pain (lower, left is worse )  Pain Assessment: Location: Lower, Left, Right Back Radiating: into the hip and down both legs  Onset: More than a month ago Duration: Chronic pain Quality: Discomfort, Burning, Numbness, Tender, Nagging Severity: 7 /10 (self-reported pain score)  Note: Reported level is compatible with observation.                         When using our objective Pain Scale, levels between 6 and 10/10 are said to belong in an emergency room, as it progressively worsens from a 6/10, described as severely limiting, requiring emergency care not usually available at an outpatient pain management facility. At a 6/10 level, communication becomes difficult and requires great effort. Assistance to reach the emergency department may be required. Facial flushing and profuse sweating along with potentially dangerous increases in heart rate and blood pressure will be evident. Effect on ADL: affects her gait and makes her weak at times.  feels like legs might go out from under her.  Timing: Constant Modifying factors: rest, heat ice or medicatoins  Ms.  Rho was last scheduled for an appointment on 10/25/2017 for medication management. During today's appointment we reviewed Ms. Brumm's chronic pain status, as well as her outpatient medication regimen.  Patient returns today for medication refill.  She states that she was able [place] blues [place] and really enjoyed it.  She states that increasing her gabapentin from 300 mg twice daily to 300 mill grams 3 times daily resulted in lower extremity swelling.  After reducing her dose back down to 300 mg twice daily, she states that her lower extremity swelling has improved.  The patient  reports that she does not use drugs. Her body mass index is 35.19 kg/m.  Further details on both, my assessment(s), as well as the proposed treatment plan, please see below.  Controlled Substance Pharmacotherapy Assessment REMS (Risk Evaluation and Mitigation Strategy)  Analgesic: start Hydrocodone 5 mg TID prn MME/day: 15 mg/day   Janett Billow, RN  11/22/2017 11:39 AM  Incomplete Nursing Pain Medication Assessment:  Safety precautions to be maintained throughout the outpatient stay will include: orient to surroundings, keep bed in low position, maintain call bell within reach at all times, provide assistance with transfer out of bed and ambulation.  Medication Inspection Compliance: Pill count conducted under aseptic conditions, in front of the patient. Neither the pills nor the bottle was removed from the patient's sight at any time. Once count was completed pills were immediately returned to the patient in their original bottle.  Medication: Hydrocodone/APAP Pill/Patch Count: 71.5 of 90 pills remain Pill/Patch Appearance: Markings consistent with prescribed medication Bottle Appearance:  Standard pharmacy container. Clearly labeled. Filled Date: 04 / 04 / 2019 Last Medication intake:  Today   Pharmacokinetics: Liberation and absorption (onset of action): WNL Distribution (time to peak effect):  WNL Metabolism and excretion (duration of action): WNL         Pharmacodynamics: Desired effects: Analgesia: Ms. Catala reports >50% benefit. Functional ability: Patient reports that medication allows her to accomplish basic ADLs Clinically meaningful improvement in function (CMIF): Sustained CMIF goals met Perceived effectiveness: Described as relatively effective, allowing for increase in activities of daily living (ADL) Undesirable effects: Side-effects or Adverse reactions: None reported Monitoring: [place] PMP: Online review of the past 60-monthperiod conducted. Compliant with practice rules and regulations Last UDS on record: Summary  Date Value Ref Range Status  09/27/2017 FINAL  Final    Comment:    ==================================================================== [facility] COMP DRUG ANALYSIS,UR ==================================================================== Test                             Result       Flag       Units Drug Present and Declared for Prescription Verification   Noroxycodone                   193          EXPECTED   ng/mg creat    Noroxycodone is an expected metabolite of oxycodone. Sources of    oxycodone include scheduled prescription medications.   Citalopram                     PRESENT      EXPECTED   Desmethylcitalopram            PRESENT      EXPECTED    Desmethylcitalopram is an expected metabolite of citalopram or    the enantiomeric form, escitalopram.   Acetaminophen                  PRESENT      EXPECTED Drug Present not Declared for Prescription Verification   Salicylate                     PRESENT      UNEXPECTED Drug Absent but Declared for Prescription Verification   Oxycodone                      Not Detected UNEXPECTED ng/mg creat    Oxycodone is almost always present in patients taking this drug    consistently.  Absence of oxycodone could be due to lapse of time    since the last dose or unusual pharmacokinetics (rapid     metabolism).   Gabapentin                     Not Detected UNEXPECTED   Methocarbamol                  Not Detected UNEXPECTED ==================================================================== Test                      Result    Flag   Units      Ref Range   Creatinine              30               mg/dL      >=20 ==================================================================== Declared Medications:  The flagging  and interpretation on this report are based on the  following declared medications.  Unexpected results may arise from  inaccuracies in the declared medications.  **Note: The testing scope of this panel includes these medications:  Citalopram (Celexa)  Gabapentin  Methocarbamol (Robaxin)  Oxycodone  **Note: The testing scope of this panel does not include small to  moderate amounts of these reported medications:  Acetaminophen  **Note: The testing scope of this panel does not include following  reported medications:  Atorvastatin (Lipitor)  Carvedilol (Coreg)  Hydrochlorothiazide (Hydrodiuril)  Melatonin  Omeprazole (Zegerid)  Phenylephrine  Ramipril (Altace)  Sodium Bicarbonate (Zegerid)  Sulfasalazine (Azulfidine)  Vitamin D3 ==================================================================== For clinical consultation, please call [phone]. ====================================================================    UDS interpretation: Compliant          Medication Assessment Form: Reviewed. Patient indicates being compliant with therapy Treatment compliance: Compliant Risk Assessment Profile: Aberrant behavior: See prior evaluations. None observed or detected today Comorbid factors increasing risk of overdose: See prior notes. No additional risks detected today Risk of substance use disorder (SUD): Low Opioid Risk Tool - 09/27/17 1321      Family History of Substance Abuse   Alcohol  Negative    Illegal Drugs  Negative    Rx Drugs  Negative       Personal History of Substance Abuse   Alcohol  Negative    Illegal Drugs  Negative    Rx Drugs  Negative      Psychological Disease   Psychological Disease  Negative    Depression  Positive      Total Score   Opioid Risk Tool Scoring  1    Opioid Risk Interpretation  Low Risk      ORT Scoring interpretation table:  Score <3 = Low Risk for SUD  Score between 4-7 = Moderate Risk for SUD  Score >8 = High Risk for Opioid Abuse   Risk Mitigation Strategies:  Patient Counseling: Covered Patient-Prescriber Agreement (PPA): Present and active  Notification to other healthcare providers: Done  Pharmacologic Plan: No change in therapy, at this time.             Laboratory Chemistry  Inflammation Markers (CRP: Acute Phase) (ESR: Chronic Phase) No results found for: CRP, ESRSEDRATE, LATICACIDVEN                       Rheumatology Markers No results found for: RF, ANA, LABURIC, URICUR, LYMEIGGIGMAB, [facility]                      Renal Function Markers Lab Results  Component Value Date   BUN 13 05/28/2017   CREATININE 0.78 05/28/2017   GFRAA >60 05/28/2017   GFRNONAA >60 05/28/2017                              Hepatic Function Markers No results found for: AST, ALT, ALBUMIN, ALKPHOS, HCVAB, AMYLASE, LIPASE, AMMONIA                      Electrolytes Lab Results  Component Value Date   NA 134 (L) 05/28/2017   K 3.8 05/28/2017   CL 96 (L) 05/28/2017   CALCIUM 9.3 05/28/2017                        Neuropathy Markers No results found for: VITAMINB12, FOLATE, HGBA1C, HIV  Bone Pathology Markers No results found for: Marveen Reeks, [id number], [id number], 25OHVITD1, 25OHVITD2, 25OHVITD3, TESTOFREE, TESTOSTERONE                       Coagulation Parameters Lab Results  Component Value Date   INR 1.06 05/28/2017   LABPROT 13.7 05/28/2017   APTT 31 05/28/2017   PLT 394 05/28/2017                        Cardiovascular Markers Lab Results   Component Value Date   HGB 12.4 05/28/2017   HCT 37.1 05/28/2017                         CA Markers No results found for: CEA, CA125, LABCA2                      Note: Lab results reviewed.  Recent Diagnostic Imaging Results  MR LUMBAR SPINE WO CONTRAST CLINICAL DATA:  Lumbar stenosis with neurogenic claudication. Lumbar decompression 05/30/2017. Low back pain with left leg pain  EXAM: MRI LUMBAR SPINE WITHOUT CONTRAST  TECHNIQUE: Multiplanar, multisequence MR imaging of the lumbar spine was performed. No intravenous contrast was administered.  COMPARISON:  Lumbar MRI 02/06/2017  FINDINGS: Segmentation:  Normal  Alignment:  Normal  Vertebrae:  Normal  Conus medullaris and cauda equina: Conus extends to the L1-2 level. Conus and cauda equina appear normal.  Paraspinal and other soft tissues: Negative for mass or adenopathy. No fluid collection.  Disc levels:  L1-2: Mild disc bulging without stenosis  L2-3: Mild disc bulging and mild facet degeneration without significant stenosis.  L3-4: Interval laminotomy on the left. Negative for spinal stenosis. Mild disc degeneration and disc bulging. Mild facet degeneration.  L4-5: Disc degeneration and diffuse disc bulging unchanged. Moderate facet hypertrophy unchanged. Mild to moderate spinal stenosis unchanged. Neural foramina patent  L5-S1: Disc degeneration and diffuse endplate spurring. Bilateral facet hypertrophy. Subarticular stenosis bilaterally left greater than right unchanged from the prior study. Small left-sided synovial cyst and ligament thickening unchanged from the prior study.  IMPRESSION: Interval laminotomy on the left at L3-4 with decompression of the canal  Mild to moderate spinal stenosis at L4-5 unchanged  Subarticular stenosis left greater than right at L5-S1 unchanged from the prior study.  Electronically Signed   By: Franchot Gallo M.D.   On: 08/29/2017 15:56  Complexity Note:  Imaging results reviewed. Results shared with Ms. Amble, using Layman's terms.                         Meds   Current Outpatient Medications:  .  acetaminophen (TYLENOL) 500 MG tablet, Take 1,000 mg by mouth at bedtime as needed (pain)., Disp: , Rfl:  .  atorvastatin (LIPITOR) 80 MG tablet, Take 80 mg by mouth daily at 6 PM., Disp: , Rfl:  .  carvedilol (COREG) 25 MG tablet, Take 12.5 mg by mouth 2 (two) times daily., Disp: , Rfl:  .  Cholecalciferol (VITAMIN D3) 2000 units capsule, Take 2,000 Units by mouth at bedtime., Disp: , Rfl:  .  CINNAMON PO, Take 1,000 mg by mouth at bedtime., Disp: , Rfl:  .  citalopram (CELEXA) 20 MG tablet, Take 20 mg by mouth every evening., Disp: , Rfl:  .  gabapentin (NEURONTIN) 300 MG capsule, Take 1 capsule (300 mg total) by mouth 2 (two) times daily., Disp:  60 capsule, Rfl: 2 .  hydrochlorothiazide (HYDRODIURIL) 25 MG tablet, Take 25 mg by mouth daily., Disp: , Rfl:  .  HYDROcodone-acetaminophen (NORCO/VICODIN) 5-325 MG tablet, Take 1 tablet by mouth 3 (three) times daily as needed for moderate pain., Disp: 90 tablet, Rfl: 0 .  Melatonin 10 MG TABS, Take 10 mg by mouth at bedtime as needed (sleep)., Disp: , Rfl:  .  methocarbamol (ROBAXIN) 500 MG tablet, Take 1 tablet (500 mg total) every 6 (six) hours as needed by mouth for muscle spasms., Disp: 120 tablet, Rfl: 0 .  Omeprazole-Sodium Bicarbonate (ZEGERID OTC) 20-1100 MG CAPS capsule, Take 1 capsule by mouth 2 (two) times daily., Disp: , Rfl:  .  phenylephrine (NEO-SYNEPHRINE) 1 % nasal spray, Place 1 drop into both nostrils daily as needed for congestion., Disp: , Rfl:  .  ramipril (ALTACE) 10 MG capsule, Take 10 mg by mouth daily., Disp: , Rfl:  .  sulfaSALAzine (AZULFIDINE) 500 MG tablet, Take 500 mg by mouth 2 (two) times daily., Disp: , Rfl:  .  amitriptyline (ELAVIL) 25 MG tablet, 25 mg nightly x2 weeks then 50 mg nightly thereafter, Disp: 60 tablet, Rfl: 1  ROS  Constitutional: Denies any fever or  chills Gastrointestinal: No reported hemesis, hematochezia, vomiting, or acute GI distress Musculoskeletal: Denies any acute onset joint swelling, redness, loss of ROM, or weakness Neurological: No reported episodes of acute onset apraxia, aphasia, dysarthria, agnosia, amnesia, paralysis, loss of coordination, or loss of consciousness  Allergies  Ms. Segreto is allergic to sulfa antibiotics.  PFSH  Drug: Ms. Florence  reports that she does not use drugs. Alcohol:  reports that she drinks alcohol. Tobacco:  reports that she has been smoking.  She has a 49.00 pack-year smoking history. She has never used smokeless tobacco. Medical:  has a past medical history of Anxiety, Arthritis, Back pain, Colitis, ulcerative ([place]), COPD (chronic obstructive pulmonary disease) ([place]), Depression, GERD (gastroesophageal reflux disease), Headache, Hypertension, Motion sickness, Stroke ([place]) (2015), TIA (transient ischemic attack) (2015), Urinary frequency, and Wears dentures. Surgical: Ms. Hankins  has a past surgical history that includes Tonsillectomy; Cholecystectomy; Abdominal hysterectomy; Diagnostic laparoscopy; Bladder suspension; Colonoscopy; Upper gi endoscopy; Knee arthroscopy (Left, 02/13/2014); Ankle arthroscopy (Left, 03/22/2016); Tendon repair (Left, 03/22/2016); and Lumbar laminectomy/decompression microdiscectomy (N/A, 05/30/2017). Family: family history includes COPD in her father; Diabetes in her mother; Heart disease in her father; Hypertension in her father and mother.  Constitutional Exam  General appearance: Well nourished, well developed, and well hydrated. In no apparent acute distress Vitals:   11/22/17 1112  BP: 116/90  Pulse: 60  Resp: 16  Temp: 98.4 F (36.9 C)  TempSrc: Oral  SpO2: 98%  Weight: 218 lb (98.9 kg)  Height: 5' 6" (1.676 m)   BMI Assessment: Estimated body mass index is 35.19 kg/m as calculated from the following:   Height as of this encounter: 5' 6" (1.676 m).   Weight as  of this encounter: 218 lb (98.9 kg).  BMI interpretation table: BMI level Category Range association with higher incidence of chronic pain  <18 kg/m2 Underweight   18.5-24.9 kg/m2 Ideal body weight   25-29.9 kg/m2 Overweight Increased incidence by 20%  30-34.9 kg/m2 Obese (Class I) Increased incidence by 68%  35-39.9 kg/m2 Severe obesity (Class II) Increased incidence by 136%  >40 kg/m2 Extreme obesity (Class III) Increased incidence by 254%   Patient's current BMI Ideal Body weight  Body mass index is 35.19 kg/m. Ideal body weight: 59.3 kg (130 lb 11.7 oz)  Adjusted ideal body weight: 75.1 kg (165 lb 10.2 oz)   BMI Readings from Last 4 Encounters:  11/22/17 35.19 kg/m  10/25/17 35.19 kg/m  09/27/17 35.51 kg/m  05/30/17 36.32 kg/m   Wt Readings from Last 4 Encounters:  11/22/17 218 lb (98.9 kg)  10/25/17 218 lb (98.9 kg)  09/27/17 220 lb (99.8 kg)  05/30/17 225 lb (102.1 kg)  Psych/Mental status: Alert, oriented x 3 (person, place, & time)       Eyes: PERLA Respiratory: No evidence of acute respiratory distress  Cervical Spine Area Exam  Skin & Axial Inspection: No masses, redness, edema, swelling, or associated skin lesions Alignment: Symmetrical Functional ROM: Unrestricted ROM      Stability: No instability detected Muscle Tone/Strength: Functionally intact. No obvious neuro-muscular anomalies detected. Sensory (Neurological): Unimpaired Palpation: No palpable anomalies              Upper Extremity (UE) Exam    Side: Right upper extremity  Side: Left upper extremity  Skin & Extremity Inspection: Skin color, temperature, and hair growth are WNL. No peripheral edema or cyanosis. No masses, redness, swelling, asymmetry, or associated skin lesions. No contractures.  Skin & Extremity Inspection: Skin color, temperature, and hair growth are WNL. No peripheral edema or cyanosis. No masses, redness, swelling, asymmetry, or associated skin lesions. No contractures.  Functional  ROM: Unrestricted ROM          Functional ROM: Unrestricted ROM          Muscle Tone/Strength: Functionally intact. No obvious neuro-muscular anomalies detected.  Muscle Tone/Strength: Functionally intact. No obvious neuro-muscular anomalies detected.  Sensory (Neurological): Unimpaired          Sensory (Neurological): Unimpaired          Palpation: No palpable anomalies              Palpation: No palpable anomalies              Specialized Test(s): Deferred         Specialized Test(s): Deferred          Thoracic Spine Area Exam  Skin & Axial Inspection: No masses, redness, or swelling Alignment: Symmetrical Functional ROM: Unrestricted ROM Stability: No instability detected Muscle Tone/Strength: Functionally intact. No obvious neuro-muscular anomalies detected. Sensory (Neurological): Unimpaired Muscle strength & Tone: No palpable anomalies Lumbar Spine Area Exam  Skin & Axial Inspection:Well healed scar from previous spine surgery detected Alignment:Symmetrical Functional VVO:HYWVPXTGG ROM, bilaterally Stability:No instability detected Muscle Tone/Strength:Functionally intact. No obvious neuro-muscular anomalies detected. Sensory (Neurological):Dermatomal pain pattern Palpation:Complains of area being tender to palpationBilateral Fist Percussion Test Provocative Tests: Lumbar Hyperextension and rotation test:Positivebilaterally for facet joint pain. Lumbar Lateral bending test:Positiveipsilateral radicular pain, bilaterally. Positive for bilateral foraminal stenosis. Patrick's Maneuver:Positivefor bilateral S-I arthralgia  Gait & Posture Assessment  Ambulation:Patient ambulates using a cane Gait:Limited. Using assistive device to ambulate Posture:Difficulty standing up straight, due to pain  Lower Extremity Exam    Side:Right lower extremity  Side:Left lower extremity  Skin & Extremity Inspection:Skin color, temperature, and hair growth are  WNL. No peripheral edema or cyanosis. No masses, redness, swelling, asymmetry, or associated skin lesions. No contractures.  Skin & Extremity Inspection:Skin color, temperature, and hair growth are WNL. No peripheral edema or cyanosis. No masses, redness, swelling, asymmetry, or associated skin lesions. No contractures.  Functional YIR:[id number] ROM  Functional XFG:[id number] ROM  Muscle Tone/Strength:Movement possible against some resistance (4/5)  Muscle Tone/Strength:Movement possible against some resistance (4/5)  Sensory (Neurological):Unimpaired  Sensory (Neurological):Unimpaired  Palpation:No palpable anomalies  Palpation:No palpable anomalies  4,out of 5 strength bilateral lower extremity: Plantar flexion, dorsiflexion, knee flexion, knee extension.   Assessment  Primary Diagnosis & Pertinent Problem List: The primary encounter diagnosis was Neurogenic claudication. Diagnoses of Spinal stenosis, lumbar region, with neurogenic claudication, Lumbar radiculopathy, Lumbar degenerative disc disease, Lumbar facet arthropathy, and Chronic pain syndrome were also pertinent to this visit.  Status Diagnosis  Controlled Controlled Controlled 1. Neurogenic claudication   2. Spinal stenosis, lumbar region, with neurogenic claudication   3. Lumbar radiculopathy   4. Lumbar degenerative disc disease   5. Lumbar facet arthropathy   6. Chronic pain syndrome      General Recommendations: The pain condition that the patient suffers from is best treated with a multidisciplinary approach that involves an increase in physical activity to prevent de-conditioning and worsening of the pain cycle, as well as psychological counseling (formal and/or informal) to address the co-morbid psychological affects of pain. Treatment will often involve judicious use of pain medications and interventional procedures to decrease the pain, allowing the patient to participate in the physical  activity that will ultimately produce long-lasting pain reductions. The goal of the multidisciplinary approach is to return the patient to a higher level of overall function and to restore their ability to perform activities of daily living.  Patient is a very pleasant 67 year old female who presents with a chief complaint of axial low back pain that radiates down into her bilateral buttocks and hipsstatus post L3-S1 lumbar laminectomy and decompression. She has had ongoing axial low back pain along with weakness of her lower extremities secondary to spinal stenosis and neurogenic claudication. Repeat spine MRI after surgery does not show any new or worsening compression. Nerve conduction shows evidence of chronic L4, L5radiculopathy. She endorses numbness and tingling in her lower extremities and finds it difficult to walk for an extended period of time. I have reviewed the patient's MRI and also discussed possible therapeutic options. We have discussed possible spinal cord stimulator trial for her persistent radicular pain. Patient wants to hold off on this currently which is reasonable. I did provide her with online resources that she can reference to get better understanding of this therapy so he can have an informed discussion in the future.  There are many non-opioid analgesics specifically neuropathic agents and membrane stabilizers that the patient has not utilized including Lyrica, amitriptyline, nortriptyline, Cymbalta.  Because the patient experienced lower extremity swelling and increased dose of gabapentin at 300 mg 3 times daily, we discussed decreasing her dose back to 300 mg twice daily and adding on amitriptyline 25 mg nightly.  Patient's most recent QTC on her EKG was 440.  I recommended the patient start 25 mg nightly and then increase to 50 mg nightly after 2 weeks if she does not have any side effects which were reviewed with her.  Given worsening acute on chronic pain, we also  discussed intramuscular Norflex and Toradol today.   Plan of Care  Pharmacotherapy (Medications Ordered): Meds ordered this encounter  Medications  . amitriptyline (ELAVIL) 25 MG tablet    Sig: 25 mg nightly x2 weeks then 50 mg nightly thereafter    Dispense:  60 tablet    Refill:  1  . orphenadrine (NORFLEX) injection 30 mg  . ketorolac (TORADOL) 30 MG/ML injection 30 mg  . gabapentin (NEURONTIN) 300 MG capsule    Sig: Take 1 capsule (300 mg total) by mouth 2 (two) times daily.    Dispense:  60 capsule    Refill:  2    Do not place this medication, or any other prescription from our practice, on "Automatic Refill". Patient may have prescription filled one day early if pharmacy is closed on scheduled refill date.   Pharmacological management options:  Opioid Analgesics: We'll take over management today. See above orders Membrane stabilizer:To be determined at a later timehowever can trial  Lyrica,  nortriptyline, Cymbalta. Currently on GABApentin 300 mg BID (TID resulted in LE swelling), start amitriptyline as above,  Muscle relaxant:To be determined at a later timehas tried Robaxin 500 mg twice daily which she found helpful. Can consider baclofen, tizanidine in the future  NSAID:To be determined at a later time  Other analgesic(s):To be determined at a later time     Interventional management options: Ms.Pinerwas informed that there is no guarantee that shewould be a candidate for interventional therapies. The decision will be based on the results of diagnostic studies, as well as Ms.Kandler's risk profile.  Procedure(s) under consideration: -Caudal epidural steroid injection -Bilateral SI joint block -Spinal cord stimulator trial   Time Note: Greater than 50% of the 25 minute(s) of face-to-face time spent with Ms. Nickey, was spent in counseling/coordination of care regarding: Ms. Freese primary cause of pain, the treatment plan, treatment alternatives, the risks and  possible complications of proposed treatment, medication side effects, the opioid analgesic risks and possible complications, realistic expectations, the goals of pain management (increased in functionality) and the medication agreement. Provider-requested follow-up: Return in about 6 weeks (around 01/03/2018) for Medication Management.  Future Appointments  Date Time Provider [place]  01/03/2018 10:15 AM Gillis Santa, MD [facility] None    Primary Care Physician: Guadlupe Spanish, MD Location: [facility] Note by: Gillis Santa, M.D Date: 11/22/2017; Time: 1:19 PM  Patient Instructions  You have scripts at the pharmacy to pick [phone]

## 2017-11-22 NOTE — Patient Instructions (Signed)
You have scripts at the pharmacy to pick [phone]

## 2017-11-22 NOTE — Progress Notes (Signed)
Nursing Pain Medication Assessment:  Safety precautions to be maintained throughout the outpatient stay will include: orient to surroundings, keep bed in low position, maintain call bell within reach at all times, provide assistance with transfer out of bed and ambulation.  Medication Inspection Compliance: Pill count conducted under aseptic conditions, in front of the patient. Neither the pills nor the bottle was removed from the patient's sight at any time. Once count was completed pills were immediately returned to the patient in their original bottle.  Medication: Hydrocodone/APAP Pill/Patch Count: 71.5 of 90 pills remain Pill/Patch Appearance: Markings consistent with prescribed medication Bottle Appearance: Standard pharmacy container. Clearly labeled. Filled Date: 04 / 04 / 2019 Last Medication intake:  Today

## 2017-11-26 ENCOUNTER — Telehealth: Payer: Self-pay | Admitting: *Deleted

## 2017-11-26 NOTE — Telephone Encounter (Signed)
Patient called to let us know the dosage of ASA that she is taking.  Addendum created and medication added to currently list.

## 2018-01-03 ENCOUNTER — Encounter: Payer: Self-pay | Admitting: Student in an Organized Health Care Education/Training Program

## 2018-01-03 ENCOUNTER — Other Ambulatory Visit: Payer: Self-pay

## 2018-01-03 ENCOUNTER — Ambulatory Visit
Payer: Medicare Other | Attending: Student in an Organized Health Care Education/Training Program | Admitting: Student in an Organized Health Care Education/Training Program

## 2018-01-03 VITALS — BP 115/80 | HR 71 | Temp 99.0°F | Resp 16 | Ht 68.0 in | Wt 218.0 lb

## 2018-01-03 DIAGNOSIS — M545 Low back pain: Secondary | ICD-10-CM | POA: Insufficient documentation

## 2018-01-03 DIAGNOSIS — F419 Anxiety disorder, unspecified: Secondary | ICD-10-CM | POA: Insufficient documentation

## 2018-01-03 DIAGNOSIS — Z7982 Long term (current) use of aspirin: Secondary | ICD-10-CM | POA: Insufficient documentation

## 2018-01-03 DIAGNOSIS — M5416 Radiculopathy, lumbar region: Secondary | ICD-10-CM | POA: Diagnosis not present

## 2018-01-03 DIAGNOSIS — M5116 Intervertebral disc disorders with radiculopathy, lumbar region: Secondary | ICD-10-CM | POA: Diagnosis not present

## 2018-01-03 DIAGNOSIS — M79605 Pain in left leg: Secondary | ICD-10-CM | POA: Insufficient documentation

## 2018-01-03 DIAGNOSIS — M5137 Other intervertebral disc degeneration, lumbosacral region: Secondary | ICD-10-CM | POA: Diagnosis not present

## 2018-01-03 DIAGNOSIS — M79604 Pain in right leg: Secondary | ICD-10-CM | POA: Insufficient documentation

## 2018-01-03 DIAGNOSIS — Z760 Encounter for issue of repeat prescription: Secondary | ICD-10-CM | POA: Insufficient documentation

## 2018-01-03 DIAGNOSIS — M5136 Other intervertebral disc degeneration, lumbar region: Secondary | ICD-10-CM | POA: Diagnosis not present

## 2018-01-03 DIAGNOSIS — Z79899 Other long term (current) drug therapy: Secondary | ICD-10-CM | POA: Insufficient documentation

## 2018-01-03 DIAGNOSIS — I10 Essential (primary) hypertension: Secondary | ICD-10-CM | POA: Diagnosis not present

## 2018-01-03 DIAGNOSIS — F329 Major depressive disorder, single episode, unspecified: Secondary | ICD-10-CM | POA: Diagnosis not present

## 2018-01-03 DIAGNOSIS — M47816 Spondylosis without myelopathy or radiculopathy, lumbar region: Secondary | ICD-10-CM | POA: Diagnosis not present

## 2018-01-03 DIAGNOSIS — Z882 Allergy status to sulfonamides status: Secondary | ICD-10-CM | POA: Diagnosis not present

## 2018-01-03 DIAGNOSIS — M4686 Other specified inflammatory spondylopathies, lumbar region: Secondary | ICD-10-CM | POA: Insufficient documentation

## 2018-01-03 DIAGNOSIS — R531 Weakness: Secondary | ICD-10-CM | POA: Diagnosis not present

## 2018-01-03 DIAGNOSIS — Z8673 Personal history of transient ischemic attack (TIA), and cerebral infarction without residual deficits: Secondary | ICD-10-CM | POA: Diagnosis not present

## 2018-01-03 DIAGNOSIS — G894 Chronic pain syndrome: Secondary | ICD-10-CM

## 2018-01-03 DIAGNOSIS — K219 Gastro-esophageal reflux disease without esophagitis: Secondary | ICD-10-CM | POA: Insufficient documentation

## 2018-01-03 DIAGNOSIS — M48062 Spinal stenosis, lumbar region with neurogenic claudication: Secondary | ICD-10-CM | POA: Diagnosis not present

## 2018-01-03 DIAGNOSIS — G9519 Other vascular myelopathies: Secondary | ICD-10-CM

## 2018-01-03 MED ORDER — METHOCARBAMOL 750 MG PO TABS: 750.0000 mg | ORAL_TABLET | Freq: Three times a day (TID) | ORAL | 3 refills | Status: DC | PRN

## 2018-01-03 MED ORDER — HYDROCODONE-ACETAMINOPHEN 5-325 MG PO TABS: 1.0000 | ORAL_TABLET | Freq: Three times a day (TID) | ORAL | 0 refills | Status: DC | PRN

## 2018-01-03 NOTE — Patient Instructions (Addendum)
You have been given prescription for Hydrocodone-Aceotamin today.  Robaxin sent to your pharmacy.   Continue amitriptyline 50 mg nightly, gabapentin 300 mg twice daily, hydrocodone 5 mg twice daily to 3 times daily as needed, Robaxin 750 mill grams 3 times daily as needed.

## 2018-01-03 NOTE — Progress Notes (Signed)
Nursing Pain Medication Assessment:  Safety precautions to be maintained throughout the outpatient stay will include: orient to surroundings, keep bed in low position, maintain call bell within reach at all times, provide assistance with transfer out of bed and ambulation.  Medication Inspection Compliance: Pill count conducted under aseptic conditions, in front of the patient. Neither the pills nor the bottle was removed from the patient's sight at any time. Once count was completed pills were immediately returned to the patient in their original bottle.  Medication: Hydrocodone/APAP Pill/Patch Count: 48 of 90 pills remain Pill/Patch Appearance: Markings consistent with prescribed medication Bottle Appearance: Standard pharmacy container. Clearly labeled. Filled Date: 04 / 19 / 2019 Last Medication intake:  Yesterday

## 2018-01-03 NOTE — Progress Notes (Signed)
Patient's Name: Tammy Lamb  MRN: [id number]  Referring Provider: Guadlupe Spanish, MD  DOB: 1950-08-27  PCP: Guadlupe Spanish, MD  DOS: 01/03/2018  Note by: Gillis Santa, MD  Service setting: Ambulatory outpatient  Specialty: Interventional Pain Management  Location: [facility] (AMB) Pain Management Facility    Patient type: Established   Primary Reason(s) for Visit: Encounter for prescription drug management. (Level of risk: moderate)  CC: Back Pain (low) and Leg Pain (anterior thighs bilaterally)  HPI  Tammy Lamb is a 67 y.o. year old, female patient, who comes today for a medication management evaluation. She has Neurogenic claudication; Spinal stenosis, lumbar region, with neurogenic claudication; Lumbar radiculopathy; Lumbar degenerative disc disease; Lumbar facet arthropathy; and Chronic pain syndrome on their problem list. Her primarily concern today is the Back Pain (low) and Leg Pain (anterior thighs bilaterally)  Pain Assessment: Location: Lower Back Radiating: legs Onset: More than a month ago Duration: Chronic pain Quality: Constant, Spasm, Aching, Tightness, Pressure Severity: 7 /10 (subjective, self-reported pain score)  Note: Reported level is compatible with observation.                         When using our objective Pain Scale, levels between 6 and 10/10 are said to belong in an emergency room, as it progressively worsens from a 6/10, described as severely limiting, requiring emergency care not usually available at an outpatient pain management facility. At a 6/10 level, communication becomes difficult and requires great effort. Assistance to reach the emergency department may be required. Facial flushing and profuse sweating along with potentially dangerous increases in heart rate and blood pressure will be evident. Effect on ADL:   Timing: Constant Modifying factors: rest, heat, medications BP: 115/80  HR: 71  Ms. Bartell was last scheduled for an appointment on 11/22/2017 for  medication management. During today's appointment we reviewed Tammy Lamb's chronic pain status, as well as her outpatient medication regimen.  The patient  reports that she does not use drugs. Her body mass index is 33.15 kg/m.  Further details on both, my assessment(s), as well as the proposed treatment plan, please see below.  Controlled Substance Pharmacotherapy Assessment REMS (Risk Evaluation and Mitigation Strategy)  Analgesic: Hydrocodone 5 mg 3 times daily as needed MME/day: 15 mg/day.  Hart Rochester, RN  01/03/2018  9:51 AM  Sign at close encounter Nursing Pain Medication Assessment:  Safety precautions to be maintained throughout the outpatient stay will include: orient to surroundings, keep bed in low position, maintain call bell within reach at all times, provide assistance with transfer out of bed and ambulation.  Medication Inspection Compliance: Pill count conducted under aseptic conditions, in front of the patient. Neither the pills nor the bottle was removed from the patient's sight at any time. Once count was completed pills were immediately returned to the patient in their original bottle.  Medication: Hydrocodone/APAP Pill/Patch Count: 48 of 90 pills remain Pill/Patch Appearance: Markings consistent with prescribed medication Bottle Appearance: Standard pharmacy container. Clearly labeled. Filled Date: 04 / 19 / 2019 Last Medication intake:  Yesterday   Pharmacokinetics: Liberation and absorption (onset of action): WNL Distribution (time to peak effect): WNL Metabolism and excretion (duration of action): WNL         Pharmacodynamics: Desired effects: Analgesia: Tammy Lamb reports >50% benefit. Functional ability: Patient reports that medication allows her to accomplish basic ADLs Clinically meaningful improvement in function (CMIF): Sustained CMIF goals met Perceived effectiveness: Described as  relatively effective, allowing for increase in activities of daily  living (ADL) Undesirable effects: Side-effects or Adverse reactions: None reported Monitoring: [place] PMP: Online review of the past 30-monthperiod conducted. Compliant with practice rules and regulations Last UDS on record: Summary  Date Value Ref Range Status  09/27/2017 FINAL  Final    Comment:    ==================================================================== [facility]RE COMP DRUG ANALYSIS,UR ==================================================================== Test                             Result       Flag       Units Drug Present and Declared for Prescription Verification   Noroxycodone                   193          EXPECTED   ng/mg creat    Noroxycodone is an expected metabolite of oxycodone. Sources of    oxycodone include scheduled prescription medications.   Citalopram                     PRESENT      EXPECTED   Desmethylcitalopram            PRESENT      EXPECTED    Desmethylcitalopram is an expected metabolite of citalopram or    the enantiomeric form, escitalopram.   Acetaminophen                  PRESENT      EXPECTED Drug Present not Declared for Prescription Verification   Salicylate                     PRESENT      UNEXPECTED Drug Absent but Declared for Prescription Verification   Oxycodone                      Not Detected UNEXPECTED ng/mg creat    Oxycodone is almost always present in patients taking this drug    consistently.  Absence of oxycodone could be due to lapse of time    since the last dose or unusual pharmacokinetics (rapid    metabolism).   Gabapentin                     Not Detected UNEXPECTED   Methocarbamol                  Not Detected UNEXPECTED ==================================================================== Test                      Result    Flag   Units      Ref Range   Creatinine              30               mg/dL      >=20 ==================================================================== Declared Medications:  The flagging  and interpretation on this report are based on the  following declared medications.  Unexpected results may arise from  inaccuracies in the declared medications.  **Note: The testing scope of this panel includes these medications:  Citalopram (Celexa)  Gabapentin  Methocarbamol (Robaxin)  Oxycodone  **Note: The testing scope of this panel does not include small to  moderate amounts of these reported medications:  Acetaminophen  **Note: The testing scope of this panel does not include following  reported medications:  Atorvastatin (Lipitor)  Carvedilol (Coreg)  Hydrochlorothiazide (Hydrodiuril)  Melatonin  Omeprazole (Zegerid)  Phenylephrine  Ramipril (Altace)  Sodium Bicarbonate (Zegerid)  Sulfasalazine (Azulfidine)  Vitamin D3 ==================================================================== For clinical consultation, please call [phone]. ====================================================================    UDS interpretation: Compliant          Medication Assessment Form: Reviewed. Patient indicates being compliant with therapy Treatment compliance: Compliant Risk Assessment Profile: Aberrant behavior: See prior evaluations. None observed or detected today Comorbid factors increasing risk of overdose: See prior notes. No additional risks detected today Risk of substance use disorder (SUD): Low Opioid Risk Tool - 01/03/18 0959      Family History of Substance Abuse   Alcohol  Positive Female    Illegal Drugs  Negative    Rx Drugs  Negative      Personal History of Substance Abuse   Alcohol  Negative    Illegal Drugs  Negative    Rx Drugs  Negative      Age   Age between 41-45 years   No      History of Preadolescent Sexual Abuse   History of Preadolescent Sexual Abuse  Negative or Female      Psychological Disease   Psychological Disease  Negative    Depression  Positive      Total Score   Opioid Risk Tool Scoring  2    Opioid Risk Interpretation   Low Risk      ORT Scoring interpretation table:  Score <3 = Low Risk for SUD  Score between 4-7 = Moderate Risk for SUD  Score >8 = High Risk for Opioid Abuse   Risk Mitigation Strategies:  Patient Counseling: Covered Patient-Prescriber Agreement (PPA): Present and active  Notification to other healthcare providers: Done  Pharmacologic Plan: No change in therapy, at this time.             Laboratory Chemistry  Inflammation Markers (CRP: Acute Phase) (ESR: Chronic Phase) No results found for: CRP, ESRSEDRATE, LATICACIDVEN                       Rheumatology Markers No results found for: RF, ANA, LABURIC, URICUR, LYMEIGGIGMAB, LYMEABIGMQN, HLAB27                      Renal Function Markers Lab Results  Component Value Date   BUN 13 05/28/2017   CREATININE 0.78 05/28/2017   GFRAA >60 05/28/2017   GFRNONAA >60 05/28/2017                             Hepatic Function Markers No results found for: AST, ALT, ALBUMIN, ALKPHOS, HCVAB, AMYLASE, LIPASE, AMMONIA                      Electrolytes Lab Results  Component Value Date   NA 134 (L) 05/28/2017   K 3.8 05/28/2017   CL 96 (L) 05/28/2017   CALCIUM 9.3 05/28/2017                        Neuropathy Markers No results found for: VITAMINB12, FOLATE, HGBA1C, HIV                      Bone Pathology Markers No results found for: [place], [id number], [id number], [id number], 25OHVITD1, 25OHVITD2, 25OHVITD3, TESTOFREE, TESTOSTERONE  Coagulation Parameters Lab Results  Component Value Date   INR 1.06 05/28/2017   LABPROT 13.7 05/28/2017   APTT 31 05/28/2017   PLT 394 05/28/2017                        Cardiovascular Markers Lab Results  Component Value Date   HGB 12.4 05/28/2017   HCT 37.1 05/28/2017                         CA Markers No results found for: CEA, CA125, LABCA2                      Note: Lab results reviewed.  Recent Diagnostic Imaging Results  MR LUMBAR SPINE WO CONTRAST CLINICAL  DATA:  Lumbar stenosis with neurogenic claudication. Lumbar decompression 05/30/2017. Low back pain with left leg pain  EXAM: MRI LUMBAR SPINE WITHOUT CONTRAST  TECHNIQUE: Multiplanar, multisequence MR imaging of the lumbar spine was performed. No intravenous contrast was administered.  COMPARISON:  Lumbar MRI 02/06/2017  FINDINGS: Segmentation:  Normal  Alignment:  Normal  Vertebrae:  Normal  Conus medullaris and cauda equina: Conus extends to the L1-2 level. Conus and cauda equina appear normal.  Paraspinal and other soft tissues: Negative for mass or adenopathy. No fluid collection.  Disc levels:  L1-2: Mild disc bulging without stenosis  L2-3: Mild disc bulging and mild facet degeneration without significant stenosis.  L3-4: Interval laminotomy on the left. Negative for spinal stenosis. Mild disc degeneration and disc bulging. Mild facet degeneration.  L4-5: Disc degeneration and diffuse disc bulging unchanged. Moderate facet hypertrophy unchanged. Mild to moderate spinal stenosis unchanged. Neural foramina patent  L5-S1: Disc degeneration and diffuse endplate spurring. Bilateral facet hypertrophy. Subarticular stenosis bilaterally left greater than right unchanged from the prior study. Small left-sided synovial cyst and ligament thickening unchanged from the prior study.  IMPRESSION: Interval laminotomy on the left at L3-4 with decompression of the canal  Mild to moderate spinal stenosis at L4-5 unchanged  Subarticular stenosis left greater than right at L5-S1 unchanged from the prior study.  Electronically Signed   By: Franchot Gallo M.D.   On: 08/29/2017 15:56  Complexity Note: Imaging results reviewed. Results shared with Ms. Garciagarcia, using Layman's terms.                         Meds   Current Outpatient Medications:  .  acetaminophen (TYLENOL) 500 MG tablet, Take 1,000 mg by mouth at bedtime as needed (pain)., Disp: , Rfl:  .  amitriptyline  (ELAVIL) 25 MG tablet, 25 mg nightly x2 weeks then 50 mg nightly thereafter, Disp: 60 tablet, Rfl: 1 .  aspirin (ECOTRIN LOW STRENGTH) 81 MG EC tablet, Take 81 mg by mouth daily. Swallow whole., Disp: , Rfl:  .  atorvastatin (LIPITOR) 80 MG tablet, Take 80 mg by mouth daily at 6 PM., Disp: , Rfl:  .  carvedilol (COREG) 25 MG tablet, Take 12.5 mg by mouth 2 (two) times daily., Disp: , Rfl:  .  Cholecalciferol (VITAMIN D3) 2000 units capsule, Take 2,000 Units by mouth at bedtime., Disp: , Rfl:  .  CINNAMON PO, Take 1,000 mg by mouth at bedtime., Disp: , Rfl:  .  citalopram (CELEXA) 20 MG tablet, Take 20 mg by mouth every evening., Disp: , Rfl:  .  gabapentin (NEURONTIN) 300 MG capsule, Take 1 capsule (300 mg total) by  mouth 2 (two) times daily., Disp: 60 capsule, Rfl: 2 .  hydrochlorothiazide (HYDRODIURIL) 25 MG tablet, Take 25 mg by mouth daily., Disp: , Rfl:  .  [START ON 01/25/2018] HYDROcodone-acetaminophen (NORCO/VICODIN) 5-325 MG tablet, Take 1 tablet by mouth 3 (three) times daily as needed for moderate pain., Disp: 90 tablet, Rfl: 0 .  Melatonin 10 MG TABS, Take 10 mg by mouth at bedtime as needed (sleep)., Disp: , Rfl:  .  Omeprazole-Sodium Bicarbonate (ZEGERID OTC) 20-1100 MG CAPS capsule, Take 1 capsule by mouth 2 (two) times daily., Disp: , Rfl:  .  phenylephrine (NEO-SYNEPHRINE) 1 % nasal spray, Place 1 drop into both nostrils daily as needed for congestion., Disp: , Rfl:  .  ramipril (ALTACE) 10 MG capsule, Take 10 mg by mouth daily., Disp: , Rfl:  .  sulfaSALAzine (AZULFIDINE) 500 MG tablet, Take 500 mg by mouth 2 (two) times daily., Disp: , Rfl:  .  methocarbamol (ROBAXIN) 750 MG tablet, Take 1 tablet (750 mg total) by mouth every 8 (eight) hours as needed for muscle spasms., Disp: 90 tablet, Rfl: 3  ROS  Constitutional: Denies any fever or chills Gastrointestinal: No reported hemesis, hematochezia, vomiting, or acute GI distress Musculoskeletal: Denies any acute onset joint swelling,  redness, loss of ROM, or weakness Neurological: No reported episodes of acute onset apraxia, aphasia, dysarthria, agnosia, amnesia, paralysis, loss of coordination, or loss of consciousness  Allergies  Ms. Izzo is allergic to sulfa antibiotics.  PFSH  Drug: Ms. Weigand  reports that she does not use drugs. Alcohol:  reports that she drinks alcohol. Tobacco:  reports that she has been smoking.  She has a 49.00 pack-year smoking history. She has never used smokeless tobacco. Medical:  has a past medical history of Anxiety, Arthritis, Back pain, Colitis, ulcerative ([place]), COPD (chronic obstructive pulmonary disease) ([place]), Depression, GERD (gastroesophageal reflux disease), Headache, Hypertension, Motion sickness, Stroke ([place]) (2015), TIA (transient ischemic attack) (2015), Urinary frequency, and Wears dentures. Surgical: Ms. Haycraft  has a past surgical history that includes Tonsillectomy; Cholecystectomy; Abdominal hysterectomy; Diagnostic laparoscopy; Bladder suspension; Colonoscopy; Upper gi endoscopy; Knee arthroscopy (Left, 02/13/2014); Ankle arthroscopy (Left, 03/22/2016); Tendon repair (Left, 03/22/2016); and Lumbar laminectomy/decompression microdiscectomy (N/A, 05/30/2017). Family: family history includes COPD in her father; Diabetes in her mother; Heart disease in her father; Hypertension in her father and mother.  Constitutional Exam  General appearance: Well nourished, well developed, and well hydrated. In no apparent acute distress Vitals:   01/03/18 0953  BP: 115/80  Pulse: 71  Resp: 16  Temp: 99 F (37.2 C)  TempSrc: Oral  SpO2: 98%  Weight: 218 lb (98.9 kg)  Height: _0  (1.727 m)   BMI Assessment: Estimated body mass index is 33.15 kg/m as calculated from the following:   Height as of this encounter: _1  (1.727 m).   Weight as of this encounter: 218 lb (98.9 kg).  BMI interpretation table: BMI level Category Range association with higher incidence of chronic pain  <18  kg/m2 Underweight   18.5-24.9 kg/m2 Ideal body weight   25-29.9 kg/m2 Overweight Increased incidence by 20%  30-34.9 kg/m2 Obese (Class I) Increased incidence by 68%  35-39.9 kg/m2 Severe obesity (Class II) Increased incidence by 136%  >40 kg/m2 Extreme obesity (Class III) Increased incidence by 254%   Patient's current BMI Ideal Body weight  Body mass index is 33.15 kg/m. Ideal body weight: 63.9 kg (140 lb 14 oz) Adjusted ideal body weight: 77.9 kg (171 lb 11.6 oz)   BMI  Readings from Last 4 Encounters:  01/03/18 33.15 kg/m  11/22/17 35.19 kg/m  10/25/17 35.19 kg/m  09/27/17 35.51 kg/m   Wt Readings from Last 4 Encounters:  01/03/18 218 lb (98.9 kg)  11/22/17 218 lb (98.9 kg)  10/25/17 218 lb (98.9 kg)  09/27/17 220 lb (99.8 kg)  Psych/Mental status: Alert, oriented x 3 (person, place, & time)       Eyes: PERLA Respiratory: No evidence of acute respiratory distress  Cervical Spine Area Exam  Skin & Axial Inspection: No masses, redness, edema, swelling, or associated skin lesions Alignment: Symmetrical Functional ROM: Unrestricted ROM      Stability: No instability detected Muscle Tone/Strength: Functionally intact. No obvious neuro-muscular anomalies detected. Sensory (Neurological): Unimpaired Palpation: No palpable anomalies              Upper Extremity (UE) Exam    Side: Right upper extremity  Side: Left upper extremity  Skin & Extremity Inspection: Skin color, temperature, and hair growth are WNL. No peripheral edema or cyanosis. No masses, redness, swelling, asymmetry, or associated skin lesions. No contractures.  Skin & Extremity Inspection: Skin color, temperature, and hair growth are WNL. No peripheral edema or cyanosis. No masses, redness, swelling, asymmetry, or associated skin lesions. No contractures.  Functional ROM: Unrestricted ROM          Functional ROM: Unrestricted ROM          Muscle Tone/Strength: Functionally intact. No obvious neuro-muscular  anomalies detected.  Muscle Tone/Strength: Functionally intact. No obvious neuro-muscular anomalies detected.  Sensory (Neurological): Unimpaired          Sensory (Neurological): Unimpaired          Palpation: No palpable anomalies              Palpation: No palpable anomalies              Provocative Test(s):  Phalen's test: deferred Tinel's test: deferred Apley's scratch test (touch opposite shoulder):  Action 1 (Across chest): deferred Action 2 (Overhead): deferred Action 3 (LB reach): deferred   Provocative Test(s):  Phalen's test: deferred Tinel's test: deferred Apley's scratch test (touch opposite shoulder):  Action 1 (Across chest): deferred Action 2 (Overhead): deferred Action 3 (LB reach): deferred    Thoracic Spine Area Exam  Skin & Axial Inspection: No masses, redness, or swelling Alignment: Symmetrical Functional ROM: Unrestricted ROM Stability: No instability detected Muscle Tone/Strength: Functionally intact. No obvious neuro-muscular anomalies detected. Sensory (Neurological): Unimpaired Muscle strength & Tone: No palpable anomalies  Lumbar Spine Area Exam  Skin & Axial Inspection: Well healed scar from previous spine surgery detected Alignment: Symmetrical Functional ROM: Decreased ROM       Stability: No instability detected Muscle Tone/Strength: Functionally intact. No obvious neuro-muscular anomalies detected. Sensory (Neurological): Dermatomal pain pattern and musculoskeletal Palpation: No palpable anomalies       Provocative Tests: Lumbar Hyperextension/rotation test: (+) bilaterally for facet joint pain. Lumbar quadrant test (Kemp's test): (+) bilateral for foraminal stenosis Lumbar Lateral bending test: (+) ipsilateral radicular pain, bilaterally. Positive for bilateral foraminal stenosis. Patrick's Maneuver: deferred today                   FABER test: deferred today       Thigh-thrust test: deferred today       S-I compression test: deferred today        S-I distraction test: deferred today        Gait & Posture Assessment  Ambulation: Limited Gait: Limited.  Using assistive device to ambulate Posture: Difficulty standing up straight, due to pain   Lower Extremity Exam    Side: Right lower extremity  Side: Left lower extremity  Stability: No instability observed          Stability: No instability observed          Skin & Extremity Inspection: Skin color, temperature, and hair growth are WNL. No peripheral edema or cyanosis. No masses, redness, swelling, asymmetry, or associated skin lesions. No contractures.  Skin & Extremity Inspection: Skin color, temperature, and hair growth are WNL. No peripheral edema or cyanosis. No masses, redness, swelling, asymmetry, or associated skin lesions. No contractures.  Functional ROM: Unrestricted ROM                  Functional ROM: Unrestricted ROM                  Muscle Tone/Strength: Functionally intact. No obvious neuro-muscular anomalies detected.  Muscle Tone/Strength: Functionally intact. No obvious neuro-muscular anomalies detected.  Sensory (Neurological): Unimpaired  Sensory (Neurological): Unimpaired  Palpation: No palpable anomalies  Palpation: No palpable anomalies   Assessment  Primary Diagnosis & Pertinent Problem List: The primary encounter diagnosis was Neurogenic claudication. Diagnoses of Spinal stenosis, lumbar region, with neurogenic claudication, Lumbar radiculopathy, Lumbar degenerative disc disease, Lumbar facet arthropathy, and Chronic pain syndrome were also pertinent to this visit.  Status Diagnosis  Controlled Controlled Controlled 1. Neurogenic claudication   2. Spinal stenosis, lumbar region, with neurogenic claudication   3. Lumbar radiculopathy   4. Lumbar degenerative disc disease   5. Lumbar facet arthropathy   6. Chronic pain syndrome      General Recommendations: The pain condition that the patient suffers from is best treated with a multidisciplinary  approach that involves an increase in physical activity to prevent de-conditioning and worsening of the pain cycle, as well as psychological counseling (formal and/or informal) to address the co-morbid psychological affects of pain. Treatment will often involve judicious use of pain medications and interventional procedures to decrease the pain, allowing the patient to participate in the physical activity that will ultimately produce long-lasting pain reductions. The goal of the multidisciplinary approach is to return the patient to a higher level of overall function and to restore their ability to perform activities of daily living.  Patient is a very pleasant 67 year old female who presents with a chief complaint of axial low back pain that radiates down into her bilateral buttocks and hipsstatus post L3-S1 lumbar laminectomy and decompression. She has had ongoing axial low back pain along with weakness of her lower extremities secondary to spinal stenosis and neurogenic claudication. Repeat spine MRI after surgery does not show any new or worsening compression. Nerve conduction shows evidence of chronic L4, L5radiculopathy. She endorses numbness and tingling in her lower extremities and finds it difficult to walk for an extended period of time. I have reviewed the patient's MRI and also discussed possible therapeutic options. We have discussed possible spinal cord stimulator trial for her persistent radicular pain. Patient wants to hold off on this currently which is reasonable. I did provide her with online resources that she can reference to get better understanding of this therapy so he can have an informed discussion in the future.  Patient follows up today for medication refill.  She states that reducing her gabapentin from 300 mg 3 times daily to 300 mg twice daily has improved with her lower extremity swelling.  Patient is also continuing amitriptyline  50 mg nightly which she states help with  sleep and also helps with her lower extremity neuropathic pain.  Patient is taking her hydrocodone once to twice a day.  She does find relief with this medication and also states that it helps with her functional ability and her ability to perform activities of daily living.  Patient is endorsing spasms in her low back as well as gluteus muscles.  We discussed Robaxin 750 mg 3 times daily as needed for the spasms.  Plan: -Refill hydrocodone as below -New prescription for Robaxin 750 mg 3 times daily as needed muscle spasms -Continue amitriptyline 50 mg nightly, gabapentin 300 mg twice daily.  Plan of Care  Pharmacotherapy (Medications Ordered): Meds ordered this encounter  Medications  . HYDROcodone-acetaminophen (NORCO/VICODIN) 5-325 MG tablet    Sig: Take 1 tablet by mouth 3 (three) times daily as needed for moderate pain.    Dispense:  90 tablet    Refill:  0    Do not place this medication, or any other prescription from our practice, on "Automatic Refill". Patient may have prescription filled one day early if pharmacy is closed on scheduled refill date.  For chronic pain To last for 30 days from fill date  . methocarbamol (ROBAXIN) 750 MG tablet    Sig: Take 1 tablet (750 mg total) by mouth every 8 (eight) hours as needed for muscle spasms.    Dispense:  90 tablet    Refill:  3    Do not place this medication, or any other prescription from our practice, on "Automatic Refill". Patient may have prescription filled one day early if pharmacy is closed on scheduled refill date.   Pharmacological management options: Opioid Analgesics:Hydrocodone 5 mg TID prn, helping with pain and ADLs Membrane stabilizer:To be determined at a later timehowever can trial Lyrica,  nortriptyline, Cymbalta. Currently on GABApentin 300 mg BID (TID resulted in LE swelling), and amitriptyline 50 mg,  Muscle relaxant:Robaxin 750 3 times daily as needed, can consider baclofen, tizanidine in the future   NSAID:To be determined at a later time  Other analgesic(s):To be determined at a later time     Interventional management options: TammyPinerwas informed that there is no guarantee that shewould be a candidate for interventional therapies. The decision will be based on the results of diagnostic studies, as well as TammyKintzel's risk profile.  Procedure(s) under consideration: -Caudal epidural steroid injection -Bilateral SI joint block -Spinal cord stimulator trial   Time Note: Greater than 50% of the 25 minute(s) of face-to-face time spent with Ms. Bitton, was spent in counseling/coordination of care regarding: Ms. Hourihan primary cause of pain, the treatment plan, treatment alternatives, medication side effects, the opioid analgesic risks and possible complications, the appropriate use of her medications, realistic expectations, the goals of pain management (increased in functionality), the need to bring and keep the BMI below 30, the medication agreement and the patient's responsibilities when it comes to controlled substances. Provider-requested follow-up: Return in about 8 weeks (around 02/28/2018) for Medication Management.  Future Appointments  Date Time Provider [place]  02/28/2018 10:15 AM Gillis Santa, MD [facility] None    Primary Care Physician: Guadlupe Spanish, MD Location: [facility] Note by: Gillis Santa, M.D Date: 01/03/2018; Time: 11:01 AM  Patient Instructions  You have been given prescription for Hydrocodone-Aceotamin today.  Robaxin sent to your pharmacy.   Continue amitriptyline 50 mg nightly, gabapentin 300 mg twice daily, hydrocodone 5 mg twice daily to 3 times  daily as needed, Robaxin 750 mill grams 3 times daily as needed.

## 2018-01-28 ENCOUNTER — Other Ambulatory Visit: Payer: Self-pay | Admitting: Student in an Organized Health Care Education/Training Program

## 2018-01-28 ENCOUNTER — Telehealth: Payer: Self-pay | Admitting: Student in an Organized Health Care Education/Training Program

## 2018-01-28 NOTE — Telephone Encounter (Signed)
Patient has a refill left on her script. Instructed to call pharmacy and to call for any further issues.

## 2018-01-28 NOTE — Telephone Encounter (Signed)
Patient was prescibed Elavil and is out, is she supposed to keep taking ? Next appt is 02-28-18 per Dr. Holley Raring instructions. Can this be called in ? Please let patient know

## 2018-02-07 ENCOUNTER — Encounter: Payer: Self-pay | Admitting: Student in an Organized Health Care Education/Training Program

## 2018-02-07 ENCOUNTER — Telehealth: Payer: Self-pay | Admitting: Student in an Organized Health Care Education/Training Program

## 2018-02-07 NOTE — Telephone Encounter (Signed)
Patient lvmail stating she is scheduled for Jury Duty in August and does not think she can sit that long. Would like to get a letter from Dr. Holley Raring stating she needs to be excused from jury duty. Please call patient and let her know if this is possible

## 2018-02-07 NOTE — Telephone Encounter (Signed)
Routed to Dr. Holley Raring.

## 2018-02-08 ENCOUNTER — Telehealth: Payer: Self-pay | Admitting: *Deleted

## 2018-02-08 NOTE — Telephone Encounter (Signed)
Pt. States she has tenderness and mild swelling in lower back. Requesting an anti inflammatory med.

## 2018-02-11 MED ORDER — DICLOFENAC SODIUM 75 MG PO TBEC: 75.0000 mg | DELAYED_RELEASE_TABLET | Freq: Two times a day (BID) | ORAL | 0 refills | Status: AC

## 2018-02-11 NOTE — Telephone Encounter (Signed)
Prescription for diclofenac called in.  Please instruct patient to not take any other NSAIDs while she is on diclofenac for 21 days.  Requested Prescriptions   Signed Prescriptions Disp Refills  . diclofenac (VOLTAREN) 75 MG EC tablet 42 tablet 0    Sig: Take 1 tablet (75 mg total) by mouth 2 (two) times daily for 21 days.    Authorizing Provider: Gillis Santa

## 2018-02-12 ENCOUNTER — Telehealth: Payer: Self-pay | Admitting: Student in an Organized Health Care Education/Training Program

## 2018-02-12 NOTE — Telephone Encounter (Addendum)
Patient lvmail asking if something would be called in for her back pain. Stated someone was supposed to call her.

## 2018-02-12 NOTE — Telephone Encounter (Signed)
Patient notified that Diclofenac was sent to pharmacy. States she has picked it up.

## 2018-02-28 ENCOUNTER — Ambulatory Visit
Payer: Medicare Other | Attending: Student in an Organized Health Care Education/Training Program | Admitting: Student in an Organized Health Care Education/Training Program

## 2018-02-28 ENCOUNTER — Other Ambulatory Visit: Payer: Self-pay

## 2018-02-28 ENCOUNTER — Encounter: Payer: Self-pay | Admitting: Student in an Organized Health Care Education/Training Program

## 2018-02-28 VITALS — BP 131/97 | HR 57 | Temp 98.9°F | Resp 18 | Ht 66.0 in | Wt 218.0 lb

## 2018-02-28 DIAGNOSIS — Z79899 Other long term (current) drug therapy: Secondary | ICD-10-CM | POA: Insufficient documentation

## 2018-02-28 DIAGNOSIS — J449 Chronic obstructive pulmonary disease, unspecified: Secondary | ICD-10-CM | POA: Diagnosis not present

## 2018-02-28 DIAGNOSIS — Z8673 Personal history of transient ischemic attack (TIA), and cerebral infarction without residual deficits: Secondary | ICD-10-CM | POA: Insufficient documentation

## 2018-02-28 DIAGNOSIS — Z8249 Family history of ischemic heart disease and other diseases of the circulatory system: Secondary | ICD-10-CM | POA: Diagnosis not present

## 2018-02-28 DIAGNOSIS — M961 Postlaminectomy syndrome, not elsewhere classified: Secondary | ICD-10-CM | POA: Insufficient documentation

## 2018-02-28 DIAGNOSIS — F329 Major depressive disorder, single episode, unspecified: Secondary | ICD-10-CM | POA: Insufficient documentation

## 2018-02-28 DIAGNOSIS — M47816 Spondylosis without myelopathy or radiculopathy, lumbar region: Secondary | ICD-10-CM

## 2018-02-28 DIAGNOSIS — M5416 Radiculopathy, lumbar region: Secondary | ICD-10-CM | POA: Diagnosis not present

## 2018-02-28 DIAGNOSIS — Z825 Family history of asthma and other chronic lower respiratory diseases: Secondary | ICD-10-CM | POA: Diagnosis not present

## 2018-02-28 DIAGNOSIS — Z90710 Acquired absence of both cervix and uterus: Secondary | ICD-10-CM | POA: Diagnosis not present

## 2018-02-28 DIAGNOSIS — F419 Anxiety disorder, unspecified: Secondary | ICD-10-CM | POA: Insufficient documentation

## 2018-02-28 DIAGNOSIS — Z79891 Long term (current) use of opiate analgesic: Secondary | ICD-10-CM | POA: Diagnosis not present

## 2018-02-28 DIAGNOSIS — M5116 Intervertebral disc disorders with radiculopathy, lumbar region: Secondary | ICD-10-CM | POA: Diagnosis not present

## 2018-02-28 DIAGNOSIS — Z7982 Long term (current) use of aspirin: Secondary | ICD-10-CM | POA: Diagnosis not present

## 2018-02-28 DIAGNOSIS — M5136 Other intervertebral disc degeneration, lumbar region: Secondary | ICD-10-CM | POA: Diagnosis not present

## 2018-02-28 DIAGNOSIS — M48062 Spinal stenosis, lumbar region with neurogenic claudication: Secondary | ICD-10-CM | POA: Insufficient documentation

## 2018-02-28 DIAGNOSIS — Z882 Allergy status to sulfonamides status: Secondary | ICD-10-CM | POA: Insufficient documentation

## 2018-02-28 DIAGNOSIS — M1288 Other specific arthropathies, not elsewhere classified, other specified site: Secondary | ICD-10-CM | POA: Insufficient documentation

## 2018-02-28 DIAGNOSIS — M4856XS Collapsed vertebra, not elsewhere classified, lumbar region, sequela of fracture: Secondary | ICD-10-CM | POA: Insufficient documentation

## 2018-02-28 DIAGNOSIS — F17210 Nicotine dependence, cigarettes, uncomplicated: Secondary | ICD-10-CM | POA: Diagnosis not present

## 2018-02-28 DIAGNOSIS — K219 Gastro-esophageal reflux disease without esophagitis: Secondary | ICD-10-CM | POA: Diagnosis not present

## 2018-02-28 DIAGNOSIS — K5190 Ulcerative colitis, unspecified, without complications: Secondary | ICD-10-CM | POA: Diagnosis not present

## 2018-02-28 DIAGNOSIS — M545 Low back pain: Secondary | ICD-10-CM | POA: Diagnosis present

## 2018-02-28 DIAGNOSIS — I10 Essential (primary) hypertension: Secondary | ICD-10-CM | POA: Insufficient documentation

## 2018-02-28 DIAGNOSIS — G894 Chronic pain syndrome: Secondary | ICD-10-CM | POA: Diagnosis not present

## 2018-02-28 DIAGNOSIS — Z833 Family history of diabetes mellitus: Secondary | ICD-10-CM | POA: Insufficient documentation

## 2018-02-28 DIAGNOSIS — Z9049 Acquired absence of other specified parts of digestive tract: Secondary | ICD-10-CM | POA: Insufficient documentation

## 2018-02-28 DIAGNOSIS — G9519 Other vascular myelopathies: Secondary | ICD-10-CM

## 2018-02-28 MED ORDER — GABAPENTIN 300 MG PO CAPS: ORAL_CAPSULE | ORAL | 2 refills | Status: DC

## 2018-02-28 MED ORDER — METHYLPREDNISOLONE 4 MG PO TBPK: ORAL_TABLET | ORAL | 0 refills | Status: AC

## 2018-02-28 NOTE — Progress Notes (Signed)
Nursing Pain Medication Assessment:  Safety precautions to be maintained throughout the outpatient stay will include: orient to surroundings, keep bed in low position, maintain call bell within reach at all times, provide assistance with transfer out of bed and ambulation.  Medication Inspection Compliance: Pill count conducted under aseptic conditions, in front of the patient. Neither the pills nor the bottle was removed from the patient's sight at any time. Once count was completed pills were immediately returned to the patient in their original bottle.  Medication: Hydrocodone/APAP Pill/Patch Count: 80 of 90 pills remain Pill/Patch Appearance: Markings consistent with prescribed medication Bottle Appearance: Standard pharmacy container. Clearly labeled. Filled Date: 07/29/ 2019 Last Medication intake:  Yesterday

## 2018-02-28 NOTE — Progress Notes (Signed)
Patient's Name: Tammy Lamb  MRN: [id number]  Referring Provider: Guadlupe Spanish, MD  DOB: April 30, 1951  PCP: Guadlupe Spanish, MD  DOS: 02/28/2018  Note by: Gillis Santa, MD  Service setting: Ambulatory outpatient  Specialty: Interventional Pain Management  Location: [facility] (AMB) Pain Management Facility    Patient type: Established   Primary Reason(s) for Visit: Encounter for prescription drug management. (Level of risk: moderate)  CC: Back Pain (lower)  HPI  Tammy Lamb is a 67 y.o. year old, female patient, who comes today for a medication management evaluation. 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 Her primarily concern today is the Back Pain (lower)  Pain Assessment: Location: Left Back Radiating: numbness, tingling, weakness in left hip, leg, and foot Onset: More than a month ago Duration: Chronic pain Quality: Burning Severity: 10-Worst pain ever/10 (subjective, self-reported pain score)  Note: Reported level is inconsistent with clinical observations. Clinically the patient looks like a 4/10 A 4/10 is viewed as "Moderately Severe" and described as impossible to ignore for more than a few minutes. With effort, patients may still be able to manage work or participate in some social activities. Very difficult to concentrate. Signs of autonomic nervous system discharge are evident: dilated pupils (mydriasis); mild sweating (diaphoresis); sleep interference. Heart rate becomes elevated (>115 bpm). Diastolic blood pressure (lower number) rises above 100 mmHg. Patients find relief in laying down and not moving. Information on the proper use of the pain scale provided to the patient today. When  using our objective Pain Scale, levels between 6 and 10/10 are said to belong in an emergency room, as it progressively worsens from a 6/10, described as severely limiting, requiring emergency care not usually available at an outpatient pain management facility. At a 6/10 level, communication becomes difficult and requires great effort. Assistance to reach the emergency department may be required. Facial flushing and profuse sweating along with potentially dangerous increases in heart rate and blood pressure will be evident. Effect on ADL:   Timing: Constant Modifying factors: medications, heat BP: (!) 131/97  HR: (!) 57  Tammy Lamb was last scheduled for an appointment on 02/12/2018 for medication management. During today's appointment we reviewed Tammy Lamb's chronic pain status, as well as her outpatient medication regimen.   67 year old female with a history of L3-S1 decompression on 05/30/2017 who presents with worsening axial low back pain as well as leg pain and weakness that is getting worse.  Patient was evaluated by orthopedics, [name] in [place].  It was noted that she had a compression fracture of her lumbar spine and [facility] recommended vertebral kyphoplasty.  She was also referred for aquatic therapy.  She is currently taking gabapentin 300 mg twice daily.  She is also utilizing her hydrocodone as prescribed.   The patient  reports that she does not use drugs. Her body mass index is 35.19 kg/m.  Further details on both, my assessment(s), as well as the proposed treatment plan, please see below.  Controlled Substance Pharmacotherapy Assessment REMS (Risk Evaluation and Mitigation Strategy)  Analgesic: Hydrocodone 5 mg 3 times daily as needed MME/day: 15 mg/day.  Tammy Martins, RN  02/28/2018 10:03 AM  Sign at close encounter Nursing Pain Medication Assessment:  Safety precautions to be maintained throughout the outpatient  stay will include: orient to  surroundings, keep bed in low position, maintain call bell within reach at all times, provide assistance with transfer out of bed and ambulation.  Medication Inspection Compliance: Pill count conducted under aseptic conditions, in front of the patient. Neither the pills nor the bottle was removed from the patient's sight at any time. Once count was completed pills were immediately returned to the patient in their original bottle.  Medication: Hydrocodone/APAP Pill/Patch Count: 80 of 90 pills remain Pill/Patch Appearance: Markings consistent with prescribed medication Bottle Appearance: Standard pharmacy container. Clearly labeled. Filled Date: 07/29/ 2019 Last Medication intake:  Yesterday   Pharmacokinetics: Liberation and absorption (onset of action): WNL Distribution (time to peak effect): WNL Metabolism and excretion (duration of action): WNL         Pharmacodynamics: Desired effects: Analgesia: Tammy Lamb reports >50% benefit. Functional ability: Patient reports that medication allows her to accomplish basic ADLs Clinically meaningful improvement in function (CMIF): Sustained CMIF goals met Perceived effectiveness: Described as relatively effective, allowing for increase in activities of daily living (ADL) Undesirable effects: Side-effects or Adverse reactions: None reported Monitoring: [place] PMP: Online review of the past 108-monthperiod conducted. Compliant with practice rules and regulations Last UDS on record: Summary  Date Value Ref Range Status  09/27/2017 FINAL  Final    Comment:    ==================================================================== TOXASSURE COMP DRUG ANALYSIS,UR ==================================================================== Test                             Result       Flag       Units Drug Present and Declared for Prescription Verification   Noroxycodone                   193          EXPECTED   ng/mg creat    Noroxycodone is an expected  metabolite of oxycodone. Sources of    oxycodone include scheduled prescription medications.   Citalopram                     PRESENT      EXPECTED   Desmethylcitalopram            PRESENT      EXPECTED    Desmethylcitalopram is an expected metabolite of citalopram or    the enantiomeric form, escitalopram.   Acetaminophen                  PRESENT      EXPECTED Drug Present not Declared for Prescription Verification   Salicylate                     PRESENT      UNEXPECTED Drug Absent but Declared for Prescription Verification   Oxycodone                      Not Detected UNEXPECTED ng/mg creat    Oxycodone is almost always present in patients taking this drug    consistently.  Absence of oxycodone could be due to lapse of time    since the last dose or unusual pharmacokinetics (rapid    metabolism).   Gabapentin                     Not Detected UNEXPECTED   Methocarbamol  Not Detected UNEXPECTED ==================================================================== Test                      Result    Flag   Units      Ref Range   Creatinine              30               mg/dL      >=20 ==================================================================== Declared Medications:  The flagging and interpretation on this report are based on the  following declared medications.  Unexpected results may arise from  inaccuracies in the declared medications.  **Note: The testing scope of this panel includes these medications:  Citalopram (Celexa)  Gabapentin  Methocarbamol (Robaxin)  Oxycodone  **Note: The testing scope of this panel does not include small to  moderate amounts of these reported medications:  Acetaminophen  **Note: The testing scope of this panel does not include following  reported medications:  Atorvastatin (Lipitor)  Carvedilol (Coreg)  Hydrochlorothiazide (Hydrodiuril)  Melatonin  Omeprazole (Zegerid)  Phenylephrine  Ramipril (Altace)  Sodium  Bicarbonate (Zegerid)  Sulfasalazine (Azulfidine)  Vitamin D3 ==================================================================== For clinical consultation, please call [phone]. ====================================================================    UDS interpretation: Compliant          Medication Assessment Form: Reviewed. Patient indicates being compliant with therapy Treatment compliance: Compliant Risk Assessment Profile: Aberrant behavior: See prior evaluations. None observed or detected today Comorbid factors increasing risk of overdose: See prior notes. No additional risks detected today Risk of substance use disorder (SUD): Low  ORT Scoring interpretation table:  Score <3 = Low Risk for SUD  Score between 4-7 = Moderate Risk for SUD  Score >8 = High Risk for Opioid Abuse   Risk Mitigation Strategies:  Patient Counseling: Covered Patient-Prescriber Agreement (PPA): Present and active  Notification to other healthcare providers: Done  Pharmacologic Plan: No change in therapy, at this time.             Laboratory Chemistry  Inflammation Markers (CRP: Acute Phase) (ESR: Chronic Phase) No results found for: CRP, ESRSEDRATE, LATICACIDVEN                       Rheumatology Markers No results found for: RF, ANA, LABURIC, URICUR, LYMEIGGIGMAB, LYMEABIGMQN, HLAB27                      Renal Function Markers Lab Results  Component Value Date   BUN 13 05/28/2017   CREATININE 0.78 05/28/2017   GFRAA >60 05/28/2017   GFRNONAA >60 05/28/2017                             Hepatic Function Markers No results found for: AST, ALT, ALBUMIN, ALKPHOS, HCVAB, AMYLASE, LIPASE, AMMONIA                      Electrolytes Lab Results  Component Value Date   NA 134 (L) 05/28/2017   K 3.8 05/28/2017   CL 96 (L) 05/28/2017   CALCIUM 9.3 05/28/2017                        Neuropathy Markers No results found for: VITAMINB12, FOLATE, HGBA1C, HIV                      Bone  Pathology Markers No results  found for: Marveen Reeks, [id number], [id number], 25OHVITD1, 25OHVITD2, 25OHVITD3, TESTOFREE, TESTOSTERONE                       Coagulation Parameters Lab Results  Component Value Date   INR 1.06 05/28/2017   LABPROT 13.7 05/28/2017   APTT 31 05/28/2017   PLT 394 05/28/2017                        Cardiovascular Markers Lab Results  Component Value Date   HGB 12.4 05/28/2017   HCT 37.1 05/28/2017                         CA Markers No results found for: CEA, CA125, LABCA2                      Note: Lab results reviewed.  Recent Diagnostic Imaging Results  MR LUMBAR SPINE WO CONTRAST CLINICAL DATA:  Lumbar stenosis with neurogenic claudication. Lumbar decompression 05/30/2017. Low back pain with left leg pain  EXAM: MRI LUMBAR SPINE WITHOUT CONTRAST  TECHNIQUE: Multiplanar, multisequence MR imaging of the lumbar spine was performed. No intravenous contrast was administered.  COMPARISON:  Lumbar MRI 02/06/2017  FINDINGS: Segmentation:  Normal  Alignment:  Normal  Vertebrae:  Normal  Conus medullaris and cauda equina: Conus extends to the L1-2 level. Conus and cauda equina appear normal.  Paraspinal and other soft tissues: Negative for mass or adenopathy. No fluid collection.  Disc levels:  L1-2: Mild disc bulging without stenosis  L2-3: Mild disc bulging and mild facet degeneration without significant stenosis.  L3-4: Interval laminotomy on the left. Negative for spinal stenosis. Mild disc degeneration and disc bulging. Mild facet degeneration.  L4-5: Disc degeneration and diffuse disc bulging unchanged. Moderate facet hypertrophy unchanged. Mild to moderate spinal stenosis unchanged. Neural foramina patent  L5-S1: Disc degeneration and diffuse endplate spurring. Bilateral facet hypertrophy. Subarticular stenosis bilaterally left greater than right unchanged from the prior study. Small left-sided synovial cyst and  ligament thickening unchanged from the prior study.  IMPRESSION: Interval laminotomy on the left at L3-4 with decompression of the canal  Mild to moderate spinal stenosis at L4-5 unchanged  Subarticular stenosis left greater than right at L5-S1 unchanged from the prior study.  Electronically Signed   By: Franchot Gallo M.D.   On: 08/29/2017 15:56  Complexity Note: Imaging results reviewed. Results shared with Ms. Trindade, using Layman's terms.                         Meds   Current Outpatient Medications:  .  acetaminophen (TYLENOL) 500 MG tablet, Take 1,000 mg by mouth at bedtime as needed (pain)., Disp: , Rfl:  .  amitriptyline (ELAVIL) 25 MG tablet, 25 mg nightly x2 weeks then 50 mg nightly thereafter, Disp: 60 tablet, Rfl: 1 .  atorvastatin (LIPITOR) 80 MG tablet, Take 80 mg by mouth daily at 6 PM., Disp: , Rfl:  .  carvedilol (COREG) 25 MG tablet, Take 12.5 mg by mouth 2 (two) times daily., Disp: , Rfl:  .  Cholecalciferol (VITAMIN D3) 2000 units capsule, Take 2,000 Units by mouth at bedtime., Disp: , Rfl:  .  CINNAMON PO, Take 1,000 mg by mouth at bedtime., Disp: , Rfl:  .  citalopram (CELEXA) 20 MG tablet, Take 20 mg by mouth every evening., Disp: , Rfl:  .  diclofenac (VOLTAREN)  75 MG EC tablet, Take 1 tablet (75 mg total) by mouth 2 (two) times daily for 21 days., Disp: 42 tablet, Rfl: 0 .  hydrochlorothiazide (HYDRODIURIL) 25 MG tablet, Take 25 mg by mouth daily., Disp: , Rfl:  .  HYDROcodone-acetaminophen (NORCO/VICODIN) 5-325 MG tablet, Take 1 tablet by mouth 3 (three) times daily as needed for moderate pain., Disp: , Rfl:  .  Melatonin 10 MG TABS, Take 10 mg by mouth at bedtime as needed (sleep)., Disp: , Rfl:  .  methocarbamol (ROBAXIN) 750 MG tablet, Take 1 tablet (750 mg total) by mouth every 8 (eight) hours as needed for muscle spasms., Disp: 90 tablet, Rfl: 3 .  Omeprazole-Sodium Bicarbonate (ZEGERID OTC) 20-1100 MG CAPS capsule, Take 1 capsule by mouth 2 (two) times  daily., Disp: , Rfl:  .  phenylephrine (NEO-SYNEPHRINE) 1 % nasal spray, Place 1 drop into both nostrils daily as needed for congestion., Disp: , Rfl:  .  ramipril (ALTACE) 10 MG capsule, Take 10 mg by mouth daily., Disp: , Rfl:  .  sulfaSALAzine (AZULFIDINE) 500 MG tablet, Take 500 mg by mouth 2 (two) times daily., Disp: , Rfl:  .  aspirin (ECOTRIN LOW STRENGTH) 81 MG EC tablet, Take 81 mg by mouth daily. Swallow whole., Disp: , Rfl:  .  gabapentin (NEURONTIN) 300 MG capsule, 300 mg day, 600 mg qhs, Disp: 90 capsule, Rfl: 2 .  methylPREDNISolone (MEDROL) 4 MG TBPK tablet, Follow package instructions., Disp: 21 tablet, Rfl: 0  ROS  Constitutional: Denies any fever or chills Gastrointestinal: No reported hemesis, hematochezia, vomiting, or acute GI distress Musculoskeletal: Denies any acute onset joint swelling, redness, loss of ROM, or weakness Neurological: No reported episodes of acute onset apraxia, aphasia, dysarthria, agnosia, amnesia, paralysis, loss of coordination, or loss of consciousness  Allergies  Ms. Coble is allergic to sulfa antibiotics.  PFSH  Drug: Ms. Hodgkiss  reports that she does not use drugs. Alcohol:  reports that she drinks alcohol. Tobacco:  reports that she has been smoking. She has a 49.00 pack-year smoking history. She has never used smokeless tobacco. Medical:  has a past medical history of Anxiety, Arthritis, Back pain, Colitis, ulcerative ([place]), COPD (chronic obstructive pulmonary disease) ([place]), Depression, GERD (gastroesophageal reflux disease), Headache, Hypertension, Motion sickness, Stroke ([place]) (2015), TIA (transient ischemic attack) (2015), Urinary frequency, and Wears dentures. Surgical: Ms. Berges  has a past surgical history that includes Tonsillectomy; Cholecystectomy; Abdominal hysterectomy; Diagnostic laparoscopy; Bladder suspension; Colonoscopy; Upper gi endoscopy; Knee arthroscopy (Left, 02/13/2014); Ankle arthroscopy (Left, 03/22/2016); Tendon repair  (Left, 03/22/2016); and Lumbar laminectomy/decompression microdiscectomy (N/A, 05/30/2017). Family: family history includes COPD in her father; Diabetes in her mother; Heart disease in her father; Hypertension in her father and mother.  Constitutional Exam  General appearance: Well nourished, well developed, and well hydrated. In no apparent acute distress Vitals:   02/28/18 0954  BP: (!) 131/97  Pulse: (!) 57  Resp: 18  Temp: 98.9 F (37.2 C)  TempSrc: Oral  SpO2: 97%  Weight: 218 lb (98.9 kg)  Height: 5' 6"  (1.676 m)   BMI Assessment: Estimated body mass index is 35.19 kg/m as calculated from the following:   Height as of this encounter: 5' 6"  (1.676 m).   Weight as of this encounter: 218 lb (98.9 kg).  BMI interpretation table: BMI level Category Range association with higher incidence of chronic pain  <18 kg/m2 Underweight   18.5-24.9 kg/m2 Ideal body weight   25-29.9 kg/m2 Overweight Increased incidence by 20%  30-34.9  kg/m2 Obese (Class I) Increased incidence by 68%  35-39.9 kg/m2 Severe obesity (Class II) Increased incidence by 136%  >40 kg/m2 Extreme obesity (Class III) Increased incidence by 254%   Patient's current BMI Ideal Body weight  Body mass index is 35.19 kg/m. Ideal body weight: 59.3 kg (130 lb 11.7 oz) Adjusted ideal body weight: 75.1 kg (165 lb 10.2 oz)   BMI Readings from Last 4 Encounters:  02/28/18 35.19 kg/m  01/03/18 33.15 kg/m  11/22/17 35.19 kg/m  10/25/17 35.19 kg/m   Wt Readings from Last 4 Encounters:  02/28/18 218 lb (98.9 kg)  01/03/18 218 lb (98.9 kg)  11/22/17 218 lb (98.9 kg)  10/25/17 218 lb (98.9 kg)  Psych/Mental status: Alert, oriented x 3 (person, place, & time)       Eyes: PERLA Respiratory: No evidence of acute respiratory distress  Cervical Spine Area Exam  Skin & Axial Inspection: No masses, redness, edema, swelling, or associated skin lesions Alignment: Symmetrical Functional ROM: Unrestricted ROM      Stability: No  instability detected Muscle Tone/Strength: Functionally intact. No obvious neuro-muscular anomalies detected. Sensory (Neurological): Unimpaired Palpation: No palpable anomalies              Upper Extremity (UE) Exam    Side: Right upper extremity  Side: Left upper extremity  Skin & Extremity Inspection: Skin color, temperature, and hair growth are WNL. No peripheral edema or cyanosis. No masses, redness, swelling, asymmetry, or associated skin lesions. No contractures.  Skin & Extremity Inspection: Skin color, temperature, and hair growth are WNL. No peripheral edema or cyanosis. No masses, redness, swelling, asymmetry, or associated skin lesions. No contractures.  Functional ROM: Unrestricted ROM          Functional ROM: Unrestricted ROM          Muscle Tone/Strength: Functionally intact. No obvious neuro-muscular anomalies detected.  Muscle Tone/Strength: Functionally intact. No obvious neuro-muscular anomalies detected.  Sensory (Neurological): Unimpaired          Sensory (Neurological): Unimpaired          Palpation: No palpable anomalies              Palpation: No palpable anomalies              Provocative Test(s):  Phalen's test: deferred Tinel's test: deferred Apley's scratch test (touch opposite shoulder):  Action 1 (Across chest): deferred Action 2 (Overhead): deferred Action 3 (LB reach): deferred   Provocative Test(s):  Phalen's test: deferred Tinel's test: deferred Apley's scratch test (touch opposite shoulder):  Action 1 (Across chest): deferred Action 2 (Overhead): deferred Action 3 (LB reach): deferred    Thoracic Spine Area Exam  Skin & Axial Inspection: No masses, redness, or swelling Alignment: Symmetrical Functional ROM: Unrestricted ROM Stability: No instability detected Muscle Tone/Strength: Functionally intact. No obvious neuro-muscular anomalies detected. Sensory (Neurological): Unimpaired Muscle strength & Tone: No palpable anomalies Lumbar Spine Area  Exam  Skin & Axial Inspection: Well healed scar from previous spine surgery detected Alignment: Symmetrical Functional ROM: Decreased ROM       Stability: No instability detected Muscle Tone/Strength: Functionally intact. No obvious neuro-muscular anomalies detected. Sensory (Neurological): Dermatomal pain pattern and musculoskeletal Palpation: No palpable anomalies       Provocative Tests: Lumbar Hyperextension/rotation test: (+) bilaterally for facet joint pain. Lumbar quadrant test (Kemp's test): (+) bilateral for foraminal stenosis Lumbar Lateral bending test: (+) ipsilateral radicular pain, bilaterally. Positive for bilateral foraminal stenosis. Patrick's Maneuver: deferred today  FABER test: deferred today       Thigh-thrust test: deferred today       S-I compression test: deferred today       S-I distraction test: deferred today        Gait & Posture Assessment  Ambulation: Limited Gait: Limited. Using assistive device to ambulate Posture: Difficulty standing up straight, due to pain   Lower Extremity Exam    Side: Right lower extremity  Side: Left lower extremity  Stability: No instability observed          Stability: No instability observed          Skin & Extremity Inspection: Skin color, temperature, and hair growth are WNL. No peripheral edema or cyanosis. No masses, redness, swelling, asymmetry, or associated skin lesions. No contractures.  Skin & Extremity Inspection: Skin color, temperature, and hair growth are WNL. No peripheral edema or cyanosis. No masses, redness, swelling, asymmetry, or associated skin lesions. No contractures.  Functional ROM: Unrestricted ROM                  Functional ROM: Unrestricted ROM                  Muscle Tone/Strength: Functionally intact. No obvious neuro-muscular anomalies detected.  Muscle Tone/Strength: Functionally intact. No obvious neuro-muscular anomalies detected.  Sensory (Neurological): Unimpaired   Sensory (Neurological): Unimpaired  Palpation: No palpable anomalies  Palpation: No palpable anomalies    Assessment  Primary Diagnosis & Pertinent Problem List: The primary encounter diagnosis was Compression fracture of lumbar vertebra, non-traumatic, sequela. 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  Status Diagnosis  Persistent Persistent Persistent 1. Compression fracture of lumbar vertebra, non-traumatic, sequela   2. Lumbar radiculopathy   3. Lumbar degenerative disc disease   4. Lumbar facet arthropathy   5. Neurogenic claudication   6. Spinal stenosis, lumbar region, with neurogenic claudication   7. 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 Treatment will often involve judicious use of pain medications and interventional procedures to decrease the pain, allowing the patient to participate in the physical activity that will ultimately produce long-lasting pain reductions. The goal of the multidisciplinary approach is to return the patient to a higher level of overall function and to restore their ability to perform activities of daily living.  Patient is a very pleasant 67 year old female who presents with a chief complaint of axial low back pain that radiates down into her bilateral buttocks and hipsstatus post L3-S1 lumbar laminectomy and decompression. She has had ongoing axial low back  pain along with weakness of her lower extremities secondary to spinal stenosis and neurogenic claudication. Repeat spine MRI after surgery does not show any new or worsening compression. Nerve conduction shows evidence of chronic L4, L5radiculopathy. She endorses numbness and tingling in her lower extremities and finds it difficult to walk for an extended period of time.  Follows up today w/ worsening axial low back pain as well as leg pain and weakness Patient was evaluated by orthopedics, [name]  in [facility].  It was noted that she had a compression fracture of her lumbar spine and [facility] recommended vertebral kyphoplasty (no xray imaging from [facility] in [facility] to detail compression fracture in lumbar spine).  She was also referred for aquatic therapy.  She is currently taking gabapentin 300 mg twice daily.  She is also utilizing her hydrocodone as prescribed.  Regards to therapeutic plan, I will refer her to Dr. Rudene Christians for consideration of possible vertebral kyphoplasty.  I will also refer her for aquatic therapy.  Also recommend that she increase her gabapentin to 600 mg nightly and continue her daytime dose.  If patient does not experience any benefit with gabapentin and is continuing to have worsening axial low back pain, I did offer the patient steroid taper which she can start in 2 weeks if she is not feeling better.  Plan: -Referral to Dr. Rudene Christians for consideration of possible vertebral kyphoplasty for lumbar compression fracture -Increase gabapentin 300 mg daily, 600 mg nightly -Medrol Dosepak if patient's pain not better in 1 to 2 weeks with increase gabapentin -Referral to physical therapy for aquatic therapy. -Continue amitriptyline and Robaxin as prescribed.   Plan of Care  Pharmacotherapy (Medications Ordered): Meds ordered this encounter  Medications  . gabapentin (NEURONTIN) 300 MG capsule    Sig: 300 mg day, 600 mg qhs    Dispense:  90 capsule     Refill:  2    Do not place this medication, or any other prescription from our practice, on "Automatic Refill". Patient may have prescription filled one day early if pharmacy is closed on scheduled refill date.  . methylPREDNISolone (MEDROL) 4 MG TBPK tablet    Sig: Follow package instructions.    Dispense:  21 tablet    Refill:  0    Do not add to the "Automatic Refill" notification system.   Lab-work, procedure(s), and/or referral(s): Orders Placed This Encounter  Procedures  . Ambulatory referral to Orthopedic Surgery  . Ambulatory referral to Physical Therapy   Time Note: Greater than 50% of the 25 minute(s) of face-to-face time spent with Ms. Leising, was spent in counseling/coordination of care regarding: Ms. Mccrackin primary cause of pain, the treatment plan, treatment alternatives, the risks and possible complications of proposed treatment, the appropriate use of her medications and realistic expectations.  Provider-requested follow-up: No follow-ups on file.  Future Appointments  Date Time Provider [place]  04/11/2018 10:15 AM Gillis Santa, MD [facility] None    Primary Care Physician: Guadlupe Spanish, MD Location: [facility] Note by: Gillis Santa, M.D Date: 02/28/2018; Time: 12:28 PM  There are no Patient Instructions on file for this visit.

## 2018-03-19 ENCOUNTER — Other Ambulatory Visit: Payer: Self-pay | Admitting: Orthopedic Surgery

## 2018-03-19 DIAGNOSIS — M5136 Other intervertebral disc degeneration, lumbar region: Secondary | ICD-10-CM

## 2018-03-21 ENCOUNTER — Other Ambulatory Visit: Payer: Self-pay | Admitting: Internal Medicine

## 2018-03-21 DIAGNOSIS — Z1231 Encounter for screening mammogram for malignant neoplasm of breast: Secondary | ICD-10-CM

## 2018-03-27 ENCOUNTER — Inpatient Hospital Stay
Admission: RE | Admit: 2018-03-27 | Discharge: 2018-03-27 | Disposition: A | Discharge status: Home / Self Care | Payer: Self-pay | Source: Ambulatory Visit | Attending: *Deleted | Admitting: *Deleted

## 2018-03-27 ENCOUNTER — Other Ambulatory Visit: Payer: Self-pay | Admitting: *Deleted

## 2018-03-27 DIAGNOSIS — Z9289 Personal history of other medical treatment: Secondary | ICD-10-CM

## 2018-04-10 ENCOUNTER — Other Ambulatory Visit: Payer: Self-pay

## 2018-04-10 ENCOUNTER — Ambulatory Visit
Payer: Medicare Other | Attending: Student in an Organized Health Care Education/Training Program | Admitting: Student in an Organized Health Care Education/Training Program

## 2018-04-10 ENCOUNTER — Encounter: Payer: Self-pay | Admitting: Student in an Organized Health Care Education/Training Program

## 2018-04-10 VITALS — BP 122/89 | HR 60 | Temp 98.6°F | Resp 18 | Ht 66.0 in | Wt 216.0 lb

## 2018-04-10 DIAGNOSIS — Z79899 Other long term (current) drug therapy: Secondary | ICD-10-CM | POA: Insufficient documentation

## 2018-04-10 DIAGNOSIS — Z79891 Long term (current) use of opiate analgesic: Secondary | ICD-10-CM | POA: Diagnosis not present

## 2018-04-10 DIAGNOSIS — Z882 Allergy status to sulfonamides status: Secondary | ICD-10-CM | POA: Diagnosis not present

## 2018-04-10 DIAGNOSIS — Z7982 Long term (current) use of aspirin: Secondary | ICD-10-CM | POA: Diagnosis not present

## 2018-04-10 DIAGNOSIS — J449 Chronic obstructive pulmonary disease, unspecified: Secondary | ICD-10-CM | POA: Insufficient documentation

## 2018-04-10 DIAGNOSIS — Z833 Family history of diabetes mellitus: Secondary | ICD-10-CM | POA: Insufficient documentation

## 2018-04-10 DIAGNOSIS — M961 Postlaminectomy syndrome, not elsewhere classified: Secondary | ICD-10-CM | POA: Diagnosis not present

## 2018-04-10 DIAGNOSIS — Z825 Family history of asthma and other chronic lower respiratory diseases: Secondary | ICD-10-CM | POA: Insufficient documentation

## 2018-04-10 DIAGNOSIS — Z760 Encounter for issue of repeat prescription: Secondary | ICD-10-CM | POA: Insufficient documentation

## 2018-04-10 DIAGNOSIS — K219 Gastro-esophageal reflux disease without esophagitis: Secondary | ICD-10-CM | POA: Insufficient documentation

## 2018-04-10 DIAGNOSIS — K5190 Ulcerative colitis, unspecified, without complications: Secondary | ICD-10-CM | POA: Insufficient documentation

## 2018-04-10 DIAGNOSIS — G894 Chronic pain syndrome: Secondary | ICD-10-CM | POA: Insufficient documentation

## 2018-04-10 DIAGNOSIS — Z8673 Personal history of transient ischemic attack (TIA), and cerebral infarction without residual deficits: Secondary | ICD-10-CM | POA: Insufficient documentation

## 2018-04-10 DIAGNOSIS — I10 Essential (primary) hypertension: Secondary | ICD-10-CM | POA: Insufficient documentation

## 2018-04-10 DIAGNOSIS — M5116 Intervertebral disc disorders with radiculopathy, lumbar region: Secondary | ICD-10-CM | POA: Insufficient documentation

## 2018-04-10 DIAGNOSIS — Z8249 Family history of ischemic heart disease and other diseases of the circulatory system: Secondary | ICD-10-CM | POA: Diagnosis not present

## 2018-04-10 DIAGNOSIS — Z90710 Acquired absence of both cervix and uterus: Secondary | ICD-10-CM | POA: Diagnosis not present

## 2018-04-10 DIAGNOSIS — M48062 Spinal stenosis, lumbar region with neurogenic claudication: Secondary | ICD-10-CM | POA: Insufficient documentation

## 2018-04-10 DIAGNOSIS — F17210 Nicotine dependence, cigarettes, uncomplicated: Secondary | ICD-10-CM | POA: Insufficient documentation

## 2018-04-10 DIAGNOSIS — M5416 Radiculopathy, lumbar region: Secondary | ICD-10-CM | POA: Diagnosis not present

## 2018-04-10 DIAGNOSIS — F419 Anxiety disorder, unspecified: Secondary | ICD-10-CM | POA: Insufficient documentation

## 2018-04-10 DIAGNOSIS — M5136 Other intervertebral disc degeneration, lumbar region: Secondary | ICD-10-CM | POA: Diagnosis not present

## 2018-04-10 DIAGNOSIS — Z9049 Acquired absence of other specified parts of digestive tract: Secondary | ICD-10-CM | POA: Insufficient documentation

## 2018-04-10 DIAGNOSIS — M47816 Spondylosis without myelopathy or radiculopathy, lumbar region: Secondary | ICD-10-CM

## 2018-04-10 DIAGNOSIS — M4856XS Collapsed vertebra, not elsewhere classified, lumbar region, sequela of fracture: Secondary | ICD-10-CM

## 2018-04-10 DIAGNOSIS — G9519 Other vascular myelopathies: Secondary | ICD-10-CM

## 2018-04-10 MED ORDER — BUPRENORPHINE 7.5 MCG/HR TD PTWK: 7.5000 ug/h | MEDICATED_PATCH | TRANSDERMAL | 1 refills | Status: DC

## 2018-04-10 MED ORDER — HYDROCODONE-ACETAMINOPHEN 5-325 MG PO TABS: 1.0000 | ORAL_TABLET | Freq: Three times a day (TID) | ORAL | 0 refills | Status: DC | PRN

## 2018-04-10 NOTE — Patient Instructions (Addendum)
You have been given 2 electronic prescriptions for Hydrocodone to last until 06/09/18 and one electronic prescription for Buprenorphine.   Medication Rules  Applies to: All patients receiving prescriptions (written or electronic).  Pharmacy of record: Pharmacy where electronic prescriptions will be sent. If written prescriptions are taken to a different pharmacy, please inform the nursing staff. The pharmacy listed in the electronic medical record should be the one where you would like electronic prescriptions to be sent.  Prescription refills: Only during scheduled appointments. Applies to both, written and electronic prescriptions.  NOTE: The following applies primarily to controlled substances (Opioid* Pain Medications).   Patient's responsibilities: 1. Pain Pills: Bring all pain pills to every appointment (except for procedure appointments). 2. Pill Bottles: Bring pills in original pharmacy bottle. Always bring newest bottle. Bring bottle, even if empty. 3. Medication refills: You are responsible for knowing and keeping track of what medications you need refilled. The day before your appointment, write a list of all prescriptions that need to be refilled. Bring that list to your appointment and give it to the admitting nurse. Prescriptions will be written only during appointments. If you forget a medication, it will not be "Called in", "Faxed", or "electronically sent". You will need to get another appointment to get these prescribed. 4. Prescription Accuracy: You are responsible for carefully inspecting your prescriptions before leaving our office. Have the discharge nurse carefully go over each prescription with you, before taking them home. Make sure that your name is accurately spelled, that your address is correct. Check the name and dose of your medication to make sure it is accurate. Check the number of pills, and the written instructions to make sure they are clear and accurate. Make sure  that you are given enough medication to last until your next medication refill appointment. 5. Taking Medication: Take medication as prescribed. Never take more pills than instructed. Never take medication more frequently than prescribed. Taking less pills or less frequently is permitted and encouraged, when it comes to controlled substances (written prescriptions).  6. Inform other Doctors: Always inform, all of your healthcare providers, of all the medications you take. 7. Pain Medication from other Providers: You are not allowed to accept any additional pain medication from any other Doctor or Healthcare provider. There are two exceptions to this rule. (see below) In the event that you require additional pain medication, you are responsible for notifying us, as stated below. 8. Medication Agreement: You are responsible for carefully reading and following our Medication Agreement. This must be signed before receiving any prescriptions from our practice. Safely store a copy of your signed Agreement. Violations to the Agreement will result in no further prescriptions. (Additional copies of our Medication Agreement are available upon request.) 9. Laws, Rules, & Regulations: All patients are expected to follow all Federal and [employer], Rules, [employer]. Ignorance of the Laws does not constitute a valid excuse. The use of any illegal substances is prohibited. 10. Adopted CDC guidelines & recommendations: Target dosing levels will be at or below 60 MME/day. Use of benzodiazepines** is not recommended.  Exceptions: There are only two exceptions to the rule of not receiving pain medications from other Healthcare Providers. 1. Exception #1 (Emergencies): In the event of an emergency (i.e.: accident requiring emergency care), you are allowed to receive additional pain medication. However, you are responsible for: As soon as you are able, call our office [phone], at any time of the day or  night, and leave a  message stating your name, the date and nature of the emergency, and the name and dose of the medication prescribed. In the event that your call is answered by a member of our staff, make sure to document and save the date, time, and the name of the person that took your information.  2. Exception #2 (Planned Surgery): In the event that you are scheduled by another doctor or dentist to have any type of surgery or procedure, you are allowed (for a period no longer than 30 days), to receive additional pain medication, for the acute post-op pain. However, in this case, you are responsible for picking up a copy of our "Post-op Pain Management for Surgeons" handout, and giving it to your surgeon or dentist. This document is available at our office, and does not require an appointment to obtain it. Simply go to our office during business hours (Monday-Thursday from 8:00 AM to 4:00 PM) (Friday 8:00 AM to 12:00 Noon) or if you have a scheduled appointment with [place], prior to your surgery, and ask for it by name. In addition, you will need to provide [place] with your name, name of your surgeon, type of surgery, and date of procedure or surgery.  *Opioid medications include: morphine, codeine, oxycodone, oxymorphone, hydrocodone, hydromorphone, meperidine, tramadol, tapentadol, buprenorphine, fentanyl, methadone. **Benzodiazepine medications include: diazepam (Valium), alprazolam (Xanax), clonazepam (Klonopine), lorazepam (Ativan), clorazepate (Tranxene), chlordiazepoxide (Librium), estazolam (Prosom), oxazepam (Serax), temazepam (Restoril), triazolam (Halcion) (Last updated: 09/20/2017)

## 2018-04-10 NOTE — Progress Notes (Signed)
Nursing Pain Medication Assessment:  Safety precautions to be maintained throughout the outpatient stay will include: orient to surroundings, keep bed in low position, maintain call bell within reach at all times, provide assistance with transfer out of bed and ambulation.  Medication Inspection Compliance: Pill count conducted under aseptic conditions, in front of the patient. Neither the pills nor the bottle was removed from the patient's sight at any time. Once count was completed pills were immediately returned to the patient in their original bottle.  Medication: Hydrocodone/APAP Pill/Patch Count: 1 of 90 pills remain Pill/Patch Appearance: Markings consistent with prescribed medication Bottle Appearance: Standard pharmacy container. Clearly labeled. Filled Date: 07 / 29 / 2019 Last Medication intake:  Yesterday

## 2018-04-10 NOTE — Progress Notes (Signed)
Patient's Name: Tammy Lamb  MRN: [id number]  Referring Provider: Guadlupe Spanish, MD  DOB: Mar 18, 1951  PCP: Guadlupe Spanish, MD  DOS: 04/10/2018  Note by: Gillis Santa, MD  Service setting: Ambulatory outpatient  Specialty: Interventional Pain Management  Location: [facility] (AMB) Pain Management Facility    Patient type: Established   Primary Reason(s) for Visit: Encounter for prescription drug management. (Level of risk: moderate)  CC: Medication Refill (Hydrocodone APAP)  HPI  Tammy Lamb is a 67 y.o. year old, female patient, who comes today for a medication management evaluation. 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 Her primarily concern today is the Medication Refill (Hydrocodone APAP)  Pain Assessment: Location: Lower, Mid, Left Back Radiating: At times can radiates down to left hip  Onset: More than a month ago Duration: Chronic pain Quality: Tender, Pressure, Constant, Tingling Severity: 8 /10 (subjective, self-reported pain score)  Note: Reported level is inconsistent with clinical observations. Clinically the patient looks like a 3/10 A 3/10 is viewed as "Moderate" and described as significantly interfering with activities of daily living (ADL). It becomes difficult to feed, bathe, get dressed, get on and off the toilet or to perform personal hygiene functions. Difficult to get in and out of bed or a chair without assistance. Very distracting. With effort, it can be ignored when deeply involved in activities. Information on the proper use of the pain scale provided to the patient today. When using our objective Pain Scale, levels between 6 and 10/10 are said to belong in an emergency  room, as it progressively worsens from a 6/10, described as severely limiting, requiring emergency care not usually available at an outpatient pain management facility. At a 6/10 level, communication becomes difficult and requires great effort. Assistance to reach the emergency department may be required. Facial flushing and profuse sweating along with potentially dangerous increases in heart rate and blood pressure will be evident. Effect on ADL: "It really slows me down"  Timing: Constant Modifying factors: Medications  BP: 122/89  HR: 60  Tammy Lamb was last scheduled for an appointment on 02/28/2018 for medication management. During today's appointment we reviewed Tammy Lamb's chronic pain status, as well as her outpatient medication regimen.  The patient  reports that she does not use drugs. Her body mass index is 34.86 kg/m.  Further details on both, my assessment(s), as well as the proposed treatment plan, please see below.  Controlled Substance Pharmacotherapy Assessment REMS (Risk Evaluation and Mitigation Strategy)  Analgesic:Hydrocodone 5 mg 3 times daily as needed MME/day:60m/day.  RJanne Napoleon RN  04/10/2018  1:07 PM  Sign at close encounter Nursing Pain Medication Assessment:  Safety precautions to be maintained throughout the outpatient stay will include: orient to surroundings, keep bed in low position, maintain call bell within reach at all times, provide assistance with transfer out of bed and ambulation.  Medication Inspection Compliance: Pill count conducted under aseptic conditions, in front of the patient. Neither the pills nor the bottle was removed from the patient's sight at any time. Once count was completed pills were immediately returned to the patient in their original bottle.  Medication: Hydrocodone/APAP Pill/Patch Count: 1 of 90 pills remain Pill/Patch Appearance: Markings consistent with prescribed medication Bottle Appearance: Standard pharmacy  container. Clearly labeled. Filled Date: 07 /  90 / 2019 Last Medication intake:  Yesterday   Pharmacokinetics: Liberation and absorption (onset of action): WNL Distribution (time to peak effect): WNL Metabolism and excretion (duration of action): WNL         Pharmacodynamics: Desired effects: Analgesia: Tammy Lamb reports >50% benefit. Functional ability: Patient reports that medication does help, but not nearly as much as she would like Clinically meaningful improvement in function (CMIF): Sustained CMIF goals met Perceived effectiveness: Described as relatively effective but with some room for improvement Undesirable effects: Side-effects or Adverse reactions: None reported Monitoring: [place] PMP: Online review of the past 22-monthperiod conducted. Compliant with practice rules and regulations Last UDS on record: Summary  Date Value Ref Range Status  09/27/2017 FINAL  Final    Comment:    ==================================================================== TOX[facility]RE COMP DRUG ANALYSIS,UR ==================================================================== Test                             Result       Flag       Units Drug Present and Declared for Prescription Verification   Noroxycodone                   193          EXPECTED   ng/mg creat    Noroxycodone is an expected metabolite of oxycodone. Sources of    oxycodone include scheduled prescription medications.   Citalopram                     PRESENT      EXPECTED   Desmethylcitalopram            PRESENT      EXPECTED    Desmethylcitalopram is an expected metabolite of citalopram or    the enantiomeric form, escitalopram.   Acetaminophen                  PRESENT      EXPECTED Drug Present not Declared for Prescription Verification   Salicylate                     PRESENT      UNEXPECTED Drug Absent but Declared for Prescription Verification   Oxycodone                      Not Detected UNEXPECTED ng/mg creat    Oxycodone is  almost always present in patients taking this drug    consistently.  Absence of oxycodone could be due to lapse of time    since the last dose or unusual pharmacokinetics (rapid    metabolism).   Gabapentin                     Not Detected UNEXPECTED   Methocarbamol                  Not Detected UNEXPECTED ==================================================================== Test                      Result    Flag   Units      Ref Range   Creatinine              30               mg/dL      >=20 ==================================================================== Declared Medications:  The flagging and interpretation on this report are based on  the  following declared medications.  Unexpected results may arise from  inaccuracies in the declared medications.  **Note: The testing scope of this panel includes these medications:  Citalopram (Celexa)  Gabapentin  Methocarbamol (Robaxin)  Oxycodone  **Note: The testing scope of this panel does not include small to  moderate amounts of these reported medications:  Acetaminophen  **Note: The testing scope of this panel does not include following  reported medications:  Atorvastatin (Lipitor)  Carvedilol (Coreg)  Hydrochlorothiazide (Hydrodiuril)  Melatonin  Omeprazole (Zegerid)  Phenylephrine  Ramipril (Altace)  Sodium Bicarbonate (Zegerid)  Sulfasalazine (Azulfidine)  Vitamin D3 ==================================================================== For clinical consultation, please call [phone]. ====================================================================    UDS interpretation: Compliant          Medication Assessment Form: Reviewed. Patient indicates being compliant with therapy Treatment compliance: Compliant Risk Assessment Profile: Aberrant behavior: See prior evaluations. None observed or detected today Comorbid factors increasing risk of overdose: See prior notes. No additional risks detected today Opioid risk  tool (ORT) (Total Score): 0 Personal History of Substance Abuse (SUD-Substance use disorder):  Alcohol: Negative  Illegal Drugs: Negative  Rx Drugs: Negative  ORT Risk Level calculation: Low Risk Risk of substance use disorder (SUD): Low Opioid Risk Tool - 04/10/18 1316      Family History of Substance Abuse   Alcohol  Negative    Illegal Drugs  Negative    Rx Drugs  Negative      Personal History of Substance Abuse   Alcohol  Negative    Illegal Drugs  Negative    Rx Drugs  Negative      Age   Age between 26-45 years   No      History of Preadolescent Sexual Abuse   History of Preadolescent Sexual Abuse  Negative or Female      Psychological Disease   Psychological Disease  Negative    Depression  Negative      Total Score   Opioid Risk Tool Scoring  0    Opioid Risk Interpretation  Low Risk      ORT Scoring interpretation table:  Score <3 = Low Risk for SUD  Score between 4-7 = Moderate Risk for SUD  Score >8 = High Risk for Opioid Abuse   Risk Mitigation Strategies:  Patient Counseling: Covered Patient-Prescriber Agreement (PPA): Present and active  Notification to other healthcare providers: Done  Pharmacologic Plan: add butrans patch (long acting)             Laboratory Chemistry  Inflammation Markers (CRP: Acute Phase) (ESR: Chronic Phase) No results found for: CRP, ESRSEDRATE, LATICACIDVEN                       Rheumatology Markers No results found for: RF, ANA, LABURIC, URICUR, LYMEIGGIGMAB, LYMEABIGMQN, HLAB27                      Renal Function Markers Lab Results  Component Value Date   BUN 13 05/28/2017   CREATININE 0.78 05/28/2017   GFRAA >60 05/28/2017   GFRNONAA >60 05/28/2017                             Hepatic Function Markers No results found for: AST, ALT, ALBUMIN, ALKPHOS, HCVAB, AMYLASE, LIPASE, AMMONIA                      Electrolytes Lab Results  Component Value Date   NA 134 (L) 05/28/2017   K 3.8 05/28/2017   CL 96 (L)  05/28/2017   CALCIUM 9.3 05/28/2017                        Neuropathy Markers No results found for: VITAMINB12, FOLATE, HGBA1C, HIV                      CNS Tests No results found for: COLORCSF, APPEARCSF, RBCCOUNTCSF, WBCCSF, POLYSCSF, LYMPHSCSF, EOSCSF, PROTEINCSF, GLUCCSF, JCVIRUS, CSFOLI, IGGCSF                      Bone Pathology Markers No results found for: VD25OH, [id number], [id number], [id number], 25OHVITD1, 25OHVITD2, 25OHVITD3, TESTOFREE, TESTOSTERONE                       Coagulation Parameters Lab Results  Component Value Date   INR 1.06 05/28/2017   LABPROT 13.7 05/28/2017   APTT 31 05/28/2017   PLT 394 05/28/2017                        Cardiovascular Markers Lab Results  Component Value Date   HGB 12.4 05/28/2017   HCT 37.1 05/28/2017                         CA Markers No results found for: CEA, CA125, LABCA2                      Note: Lab results reviewed.  Recent Diagnostic Imaging Results  MM Outside Films Mammo This examination belongs to an outside facility and is stored here for  comparison purposes only.  Contact the originating outside institution for  any associated report or interpretation.  Complexity Note: Imaging results reviewed. Results shared with Ms. Vandenheuvel, using Layman's terms.                         Meds   Current Outpatient Medications:  .  acetaminophen (TYLENOL) 500 MG tablet, Take 1,000 mg by mouth at bedtime as needed (pain)., Disp: , Rfl:  .  atorvastatin (LIPITOR) 80 MG tablet, Take 80 mg by mouth daily at 6 PM., Disp: , Rfl:  .  carvedilol (COREG) 25 MG tablet, Take 12.5 mg by mouth 2 (two) times daily., Disp: , Rfl:  .  Cholecalciferol (VITAMIN D3) 2000 units capsule, Take 2,000 Units by mouth at bedtime., Disp: , Rfl:  .  CINNAMON PO, Take 1,000 mg by mouth at bedtime., Disp: , Rfl:  .  citalopram (CELEXA) 20 MG tablet, Take 20 mg by mouth every evening., Disp: , Rfl:  .  gabapentin (NEURONTIN) 300 MG capsule, 300 mg  day, 600 mg qhs, Disp: 90 capsule, Rfl: 2 .  hydrochlorothiazide (HYDRODIURIL) 25 MG tablet, Take 25 mg by mouth daily., Disp: , Rfl:  .  HYDROcodone-acetaminophen (NORCO/VICODIN) 5-325 MG tablet, Take 1 tablet by mouth 3 (three) times daily as needed for moderate pain., Disp: 90 tablet, Rfl: 0 .  Melatonin 10 MG TABS, Take 10 mg by mouth at bedtime as needed (sleep)., Disp: , Rfl:  .  methocarbamol (ROBAXIN) 750 MG tablet, Take 1 tablet (750 mg total) by mouth every 8 (eight) hours as needed for muscle spasms., Disp: 90 tablet, Rfl: 3 .  Omeprazole-Sodium Bicarbonate (ZEGERID OTC) 20-1100 MG  CAPS capsule, Take 1 capsule by mouth 2 (two) times daily., Disp: , Rfl:  .  phenylephrine (NEO-SYNEPHRINE) 1 % nasal spray, Place 1 drop into both nostrils daily as needed for congestion., Disp: , Rfl:  .  ramipril (ALTACE) 10 MG capsule, Take 10 mg by mouth daily., Disp: , Rfl:  .  sulfaSALAzine (AZULFIDINE) 500 MG tablet, Take 500 mg by mouth 2 (two) times daily., Disp: , Rfl:  .  aspirin (ECOTRIN LOW STRENGTH) 81 MG EC tablet, Take 81 mg by mouth daily. Swallow whole., Disp: , Rfl:  .  Buprenorphine 7.5 MCG/HR PTWK, Place 7.5 mcg/hr onto the skin every 7 (seven) days., Disp: 4 patch, Rfl: 1 .  [START ON 05/10/2018] HYDROcodone-acetaminophen (NORCO/VICODIN) 5-325 MG tablet, Take 1 tablet by mouth 3 (three) times daily as needed for moderate pain., Disp: 90 tablet, Rfl: 0  ROS  Constitutional: Denies any fever or chills Gastrointestinal: No reported hemesis, hematochezia, vomiting, or acute GI distress Musculoskeletal: Denies any acute onset joint swelling, redness, loss of ROM, or weakness Neurological: No reported episodes of acute onset apraxia, aphasia, dysarthria, agnosia, amnesia, paralysis, loss of coordination, or loss of consciousness  Allergies  Ms. Wiggs is allergic to sulfa antibiotics.  PFSH  Drug: Ms. Fenn  reports that she does not use drugs. Alcohol:  reports that she drinks  alcohol. Tobacco:  reports that she has been smoking. She has a 49.00 pack-year smoking history. She has never used smokeless tobacco. Medical:  has a past medical history of Anxiety, Arthritis, Back pain, Colitis, ulcerative ([place]), COPD (chronic obstructive pulmonary disease) ([place]), Depression, GERD (gastroesophageal reflux disease), Headache, Hypertension, Motion sickness, Stroke ([place]) (2015), TIA (transient ischemic attack) (2015), Urinary frequency, and Wears dentures. Surgical: Ms. Manzi  has a past surgical history that includes Tonsillectomy; Cholecystectomy; Abdominal hysterectomy; Diagnostic laparoscopy; Bladder suspension; Colonoscopy; Upper gi endoscopy; Knee arthroscopy (Left, 02/13/2014); Ankle arthroscopy (Left, 03/22/2016); Tendon repair (Left, 03/22/2016); and Lumbar laminectomy/decompression microdiscectomy (N/A, 05/30/2017). Family: family history includes COPD in her father; Diabetes in her mother; Heart disease in her father; Hypertension in her father and mother.  Constitutional Exam  General appearance: Well nourished, well developed, and well hydrated. In no apparent acute distress Vitals:   04/10/18 1307  BP: 122/89  Pulse: 60  Resp: 18  Temp: 98.6 F (37 C)  SpO2: 98%  Weight: 216 lb (98 kg)  Height: _0  (1.676 m)   BMI Assessment: Estimated body mass index is 34.86 kg/m as calculated from the following:   Height as of this encounter: _1  (1.676 m).   Weight as of this encounter: 216 lb (98 kg).  BMI interpretation table: BMI level Category Range association with higher incidence of chronic pain  <18 kg/m2 Underweight   18.5-24.9 kg/m2 Ideal body weight   25-29.9 kg/m2 Overweight Increased incidence by 20%  30-34.9 kg/m2 Obese (Class I) Increased incidence by 68%  35-39.9 kg/m2 Severe obesity (Class II) Increased incidence by 136%  >40 kg/m2 Extreme obesity (Class III) Increased incidence by 254%   Patient's current BMI Ideal Body weight  Body mass index is  34.86 kg/m. Ideal body weight: 59.3 kg (130 lb 11.7 oz) Adjusted ideal body weight: 74.8 kg (164 lb 13.4 oz)   BMI Readings from Last 4 Encounters:  04/10/18 34.86 kg/m  02/28/18 35.19 kg/m  01/03/18 33.15 kg/m  11/22/17 35.19 kg/m   Wt Readings from Last 4 Encounters:  04/10/18 216 lb (98 kg)  02/28/18 218 lb (98.9 kg)  01/03/18 218 lb (  98.9 kg)  11/22/17 218 lb (98.9 kg)  Psych/Mental status: Alert, oriented x 3 (person, place, & time)       Eyes: PERLA Respiratory: No evidence of acute respiratory distress  Cervical Spine Area Exam  Skin & Axial Inspection: No masses, redness, edema, swelling, or associated skin lesions Alignment: Symmetrical Functional ROM: Unrestricted ROM      Stability: No instability detected Muscle Tone/Strength: Functionally intact. No obvious neuro-muscular anomalies detected. Sensory (Neurological): Unimpaired Palpation: No palpable anomalies              Upper Extremity (UE) Exam    Side: Right upper extremity  Side: Left upper extremity  Skin & Extremity Inspection: Skin color, temperature, and hair growth are WNL. No peripheral edema or cyanosis. No masses, redness, swelling, asymmetry, or associated skin lesions. No contractures.  Skin & Extremity Inspection: Skin color, temperature, and hair growth are WNL. No peripheral edema or cyanosis. No masses, redness, swelling, asymmetry, or associated skin lesions. No contractures.  Functional ROM: Unrestricted ROM          Functional ROM: Unrestricted ROM          Muscle Tone/Strength: Functionally intact. No obvious neuro-muscular anomalies detected.  Muscle Tone/Strength: Functionally intact. No obvious neuro-muscular anomalies detected.  Sensory (Neurological): Unimpaired          Sensory (Neurological): Unimpaired          Palpation: No palpable anomalies              Palpation: No palpable anomalies              Provocative Test(s):  Phalen's test: deferred Tinel's test: deferred Apley's  scratch test (touch opposite shoulder):  Action 1 (Across chest): deferred Action 2 (Overhead): deferred Action 3 (LB reach): deferred   Provocative Test(s):  Phalen's test: deferred Tinel's test: deferred Apley's scratch test (touch opposite shoulder):  Action 1 (Across chest): deferred Action 2 (Overhead): deferred Action 3 (LB reach): deferred    Thoracic Spine Area Exam  Skin & Axial Inspection: No masses, redness, or swelling Alignment: Symmetrical Functional ROM: Unrestricted ROM Stability: No instability detected Muscle Tone/Strength: Functionally intact. No obvious neuro-muscular anomalies detected. Sensory (Neurological): Unimpaired Muscle strength & Tone: No palpable anomalies Lumbar Spine Area Exam  Skin & Axial Inspection:Well healed scar from previous spine surgery detected Alignment:Symmetrical Functional PXT:GGYIRSWNI ROM Stability:No instability detected Muscle Tone/Strength:Functionally intact. No obvious neuro-muscular anomalies detected. Sensory (Neurological):Dermatomal pain patternand musculoskeletal Palpation:No palpable anomalies Provocative Tests: Lumbar Hyperextension/rotation test:(+)bilaterally for facet joint pain. Lumbar quadrant test (Kemp's test):(+)bilateral for foraminal stenosis Lumbar Lateral bending test:(+)ipsilateral radicular pain, bilaterally. Positive for bilateral foraminal stenosis. Patrick's Maneuver:deferred today FABER test:deferred today Thigh-thrust test:deferred today S-I compression test:deferred today S-I distraction test:deferred today  Gait & Posture Assessment  Ambulation:Limited Gait:Limited. Using assistive device to ambulate Posture:Difficulty standing up straight, due to pain  Lower Extremity Exam    Side:Right lower extremity  Side:Left lower extremity  Stability:No instability observed  Stability:No instability  observed  Skin & Extremity Inspection:Skin color, temperature, and hair growth are WNL. No peripheral edema or cyanosis. No masses, redness, swelling, asymmetry, or associated skin lesions. No contractures.  Skin & Extremity Inspection:Skin color, temperature, and hair growth are WNL. No peripheral edema or cyanosis. No masses, redness, swelling, asymmetry, or associated skin lesions. No contractures.  Functional OEV:[id number] ROM   Functional HBZ:[id number] ROM   Muscle Tone/Strength:Functionally intact. No obvious neuro-muscular anomalies detected.  Muscle Tone/Strength:Functionally intact. No obvious neuro-muscular anomalies detected.  Sensory (Neurological):Unimpaired  Sensory (Neurological):Unimpaired  Palpation:No palpable anomalies  Palpation:No palpable anomalies     Assessment  Primary Diagnosis & Pertinent Problem List: The primary encounter diagnosis was Compression fracture of lumbar vertebra, non-traumatic, sequela. 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  Status Diagnosis  Controlled Controlled Controlled 1. Compression fracture of lumbar vertebra, non-traumatic, sequela   2. Lumbar radiculopathy   3. Lumbar degenerative disc disease   4. Lumbar facet arthropathy   5. Neurogenic claudication   6. Spinal stenosis, lumbar region, with neurogenic claudication   7. 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 Treatment will often involve judicious use of pain medications and interventional procedures to decrease the pain, allowing the patient to participate in the physical activity that will ultimately produce long-lasting pain reductions. The goal of the multidisciplinary approach is to return the patient to a higher level of overall function and to restore their ability to perform activities of daily living.  Patient is a very pleasant 67 year old female who presents with a chief complaint of axial low back pain that radiates down into her bilateral buttocks and hipsstatus post L3-S1 lumbar laminectomy and decompression. She has had ongoing axial low back pain along with weakness of her lower extremities secondary to spinal stenosis and neurogenic claudication. Repeat spine MRI after surgery does not show any new or worsening compression. Nerve conduction shows evidence of chronic L4, L5radiculopathy. She endorses numbness and tingling in her lower extremities and finds it difficult to walk for an extended period of time.  Patient was seen at [facility].  They ordered a intradiscal injection at L5-S1 per Dr. Rolena Infante.  Patient states that she has a procedure tomorrow with [facility] radiology.  I also had a brief discussion about spinal cord stimulation with the patient in regards to her symptoms. She will think about this.  In regards to medication management, I will add Butrans patch to help out with pain control.  This is a long acting opioid analgesic and patient can continue to take her hydrocodone for breakthrough pain.  Continue Gabapentin 300 mg daily, 600 mg qhs  Plan of Care  Pharmacotherapy (Medications Ordered): Meds ordered this encounter  Medications  . Buprenorphine 7.5 MCG/HR PTWK    Sig: Place 7.5 mcg/hr onto the skin every 7 (seven) days.    Dispense:  4 patch    Refill:  1    Do not place this medication, or  any other prescription from our practice, on "Automatic Refill". Patient may have prescription filled one day early if pharmacy is closed on scheduled refill date.  Marland Kitchen HYDROcodone-acetaminophen (NORCO/VICODIN) 5-325 MG tablet    Sig: Take 1 tablet by mouth 3 (three) times daily as needed for moderate pain.    Dispense:  90 tablet    Refill:  0  . HYDROcodone-acetaminophen (NORCO/VICODIN) 5-325 MG tablet    Sig: Take 1 tablet by mouth 3 (three) times daily as needed for moderate pain.    Dispense:  90 tablet    Refill:  0  Do not place this medication, or any other prescription from our practice, on "Automatic Refill". Patient may have prescription filled one day early if pharmacy is closed on scheduled refill date.   Time Note: Greater than 50% of the 25 minute(s) of face-to-face time spent with Ms. Gangwer, was spent in counseling/coordination of care regarding: Ms. Kang primary cause of pain, the treatment plan, treatment alternatives, medication side effects, going over the informed consent, the opioid analgesic risks and possible complications, the appropriate use of her medications, realistic expectations and the goals of pain management (increased in functionality).  Provider-requested follow-up: Return in about 7 weeks (around 05/29/2018) for Medication Management.  Future Appointments  Date Time Provider [place]  04/11/2018  9:00 AM [facility] RM 3 [facility]  04/30/2018  2:00 PM [facility]  05/28/2018  9:30 AM Gillis Santa, MD [facility] None    Primary Care Physician: Guadlupe Spanish, MD Location: [facility] Note by: Gillis Santa, M.D Date: 04/10/2018; Time: 2:53 PM  Patient Instructions  You have been given 2 electronic prescriptions for Hydrocodone to last until 06/09/18 and one electronic prescription for Buprenorphine.   Medication Rules  Applies to: All patients receiving prescriptions (written or  electronic).  Pharmacy of record: Pharmacy where electronic prescriptions will be sent. If written prescriptions are taken to a different pharmacy, please inform the nursing staff. The pharmacy listed in the electronic medical record should be the one where you would like electronic prescriptions to be sent.  Prescription refills: Only during scheduled appointments. Applies to both, written and electronic prescriptions.  NOTE: The following applies primarily to controlled substances (Opioid* Pain Medications).   Patient's responsibilities: 1. Pain Pills: Bring all pain pills to every appointment (except for procedure appointments). 2. Pill Bottles: Bring pills in original pharmacy bottle. Always bring newest bottle. Bring bottle, even if empty. 3. Medication refills: You are responsible for knowing and keeping track of what medications you need refilled. The day before your appointment, write a list of all prescriptions that need to be refilled. Bring that list to your appointment and give it to the admitting nurse. Prescriptions will be written only during appointments. If you forget a medication, it will not be "Called in", "Faxed", or "electronically sent". You will need to get another appointment to get these prescribed. 4. Prescription Accuracy: You are responsible for carefully inspecting your prescriptions before leaving our office. Have the discharge nurse carefully go over each prescription with you, before taking them home. Make sure that your name is accurately spelled, that your address is correct. Check the name and dose of your medication to make sure it is accurate. Check the number of pills, and the written instructions to make sure they are clear and accurate. Make sure that you are given enough medication to last until your next medication refill appointment. 5. Taking Medication: Take medication as prescribed. Never take more pills than instructed. Never take medication more  frequently than prescribed. Taking less pills or less frequently is permitted and encouraged, when it comes to controlled substances (written prescriptions).  6. Inform other Doctors: Always inform, all of your healthcare providers, of all the medications you take. 7. Pain Medication from other Providers: You are not allowed to accept any additional pain medication from any other Doctor or Healthcare provider. There are two exceptions to this rule. (see below) In the event that you require additional pain medication, you are responsible for notifying us, as  stated below. 8. Medication Agreement: You are responsible for carefully reading and following our Medication Agreement. This must be signed before receiving any prescriptions from our practice. Safely store a copy of your signed Agreement. Violations to the Agreement will result in no further prescriptions. (Additional copies of our Medication Agreement are available upon request.) 9. Laws, Rules, & Regulations: All patients are expected to follow all Federal and [employer], Rules, [employer]. Ignorance of the Laws does not constitute a valid excuse. The use of any illegal substances is prohibited. 10. Adopted CDC guidelines & recommendations: Target dosing levels will be at or below 60 MME/day. Use of benzodiazepines** is not recommended.  Exceptions: There are only two exceptions to the rule of not receiving pain medications from other Healthcare Providers. 1. Exception #1 (Emergencies): In the event of an emergency (i.e.: accident requiring emergency care), you are allowed to receive additional pain medication. However, you are responsible for: As soon as you are able, call our office [phone], at any time of the day or night, and leave a message stating your name, the date and nature of the emergency, and the name and dose of the medication prescribed. In the event that your call is answered by a member of our staff, make sure to  document and save the date, time, and the name of the person that took your information.  2. Exception #2 (Planned Surgery): In the event that you are scheduled by another doctor or dentist to have any type of surgery or procedure, you are allowed (for a period no longer than 30 days), to receive additional pain medication, for the acute post-op pain. However, in this case, you are responsible for picking up a copy of our "Post-op Pain Management for Surgeons" handout, and giving it to your surgeon or dentist. This document is available at our office, and does not require an appointment to obtain it. Simply go to our office during business hours (Monday-Thursday from 8:00 AM to 4:00 PM) (Friday 8:00 AM to 12:00 Noon) or if you have a scheduled appointment with [place], prior to your surgery, and ask for it by name. In addition, you will need to provide [place] with your name, name of your surgeon, type of surgery, and date of procedure or surgery.  *Opioid medications include: morphine, codeine, oxycodone, oxymorphone, hydrocodone, hydromorphone, meperidine, tramadol, tapentadol, buprenorphine, fentanyl, methadone. **Benzodiazepine medications include: diazepam (Valium), alprazolam (Xanax), clonazepam (Klonopine), lorazepam (Ativan), clorazepate (Tranxene), chlordiazepoxide (Librium), estazolam (Prosom), oxazepam (Serax), temazepam (Restoril), triazolam (Halcion) (Last updated: 09/20/2017)

## 2018-04-11 ENCOUNTER — Ambulatory Visit
Admission: RE | Admit: 2018-04-11 | Discharge: 2018-04-11 | Disposition: A | Discharge status: Home / Self Care | Payer: Medicare Other | Source: Ambulatory Visit | Attending: Orthopedic Surgery | Admitting: Orthopedic Surgery

## 2018-04-11 ENCOUNTER — Encounter: Payer: Medicare Other | Admitting: Student in an Organized Health Care Education/Training Program

## 2018-04-11 ENCOUNTER — Other Ambulatory Visit: Payer: Self-pay | Admitting: Orthopedic Surgery

## 2018-04-11 DIAGNOSIS — M5136 Other intervertebral disc degeneration, lumbar region: Secondary | ICD-10-CM

## 2018-04-11 MED ORDER — CEFAZOLIN SODIUM-DEXTROSE 2-4 GM/100ML-% IV SOLN
2.0000 g | Freq: Once | INTRAVENOUS | Status: AC
  Administered 2018-04-11: 2 g via INTRAVENOUS

## 2018-04-11 MED ORDER — IOPAMIDOL (ISOVUE-M 200) INJECTION 41%
1.0000 mL | Freq: Once | INTRAMUSCULAR | Status: AC
  Administered 2018-04-11: 1 mL

## 2018-04-11 NOTE — Discharge Instructions (Signed)
Intradiscal Lidocaine Injection Discharge Instructions  1. You may resume a regular diet and any medications that you routinely take (including pain medications). 2. No driving the day of procedure. 3. You may use an ice pack as needed to injection sites on back. 4. Remove bandaids later today.    Please contact our office at [phone] for the following symptoms:   Fever greater than 100 degrees  Increased swelling, pain, or redness at injection site.   Thank you for visiting [facility].

## 2018-04-16 ENCOUNTER — Encounter: Payer: Medicare Other | Admitting: Student in an Organized Health Care Education/Training Program

## 2018-04-30 ENCOUNTER — Ambulatory Visit
Admission: RE | Admit: 2018-04-30 | Discharge: 2018-04-30 | Disposition: A | Discharge status: Home / Self Care | Payer: Medicare Other | Source: Ambulatory Visit | Attending: Internal Medicine | Admitting: Internal Medicine

## 2018-04-30 DIAGNOSIS — Z1231 Encounter for screening mammogram for malignant neoplasm of breast: Secondary | ICD-10-CM | POA: Insufficient documentation

## 2018-05-16 ENCOUNTER — Telehealth: Payer: Self-pay | Admitting: Student in an Organized Health Care Education/Training Program

## 2018-05-16 NOTE — Telephone Encounter (Signed)
States she was told to call in for her Hydrocodone refill. She is ready for refill please.

## 2018-05-16 NOTE — Telephone Encounter (Signed)
Patient's medications were E prescribed at her last visit.  She still has another prescription that she can fill at the pharmacy.  Please call patient and instruct her of this.  Please refer to E prescribed prescription for fill dates

## 2018-05-16 NOTE — Telephone Encounter (Signed)
Spoke with patient to let her know that she should have another Rx at pharmacy for hydrocodone - apap.  States she misunderstood and thought she needed to call for more pain medications.  She has an appt on 05/28/18 for evaluation and med management.

## 2018-05-28 ENCOUNTER — Ambulatory Visit
Payer: Medicare Other | Attending: Student in an Organized Health Care Education/Training Program | Admitting: Student in an Organized Health Care Education/Training Program

## 2018-05-28 ENCOUNTER — Encounter: Payer: Self-pay | Admitting: Student in an Organized Health Care Education/Training Program

## 2018-05-28 ENCOUNTER — Other Ambulatory Visit: Payer: Self-pay

## 2018-05-28 VITALS — BP 141/93 | HR 60 | Temp 98.6°F | Resp 18 | Ht 66.0 in | Wt 207.0 lb

## 2018-05-28 DIAGNOSIS — K5190 Ulcerative colitis, unspecified, without complications: Secondary | ICD-10-CM | POA: Diagnosis not present

## 2018-05-28 DIAGNOSIS — Z8249 Family history of ischemic heart disease and other diseases of the circulatory system: Secondary | ICD-10-CM | POA: Insufficient documentation

## 2018-05-28 DIAGNOSIS — Z79899 Other long term (current) drug therapy: Secondary | ICD-10-CM | POA: Diagnosis not present

## 2018-05-28 DIAGNOSIS — M48062 Spinal stenosis, lumbar region with neurogenic claudication: Secondary | ICD-10-CM

## 2018-05-28 DIAGNOSIS — Z833 Family history of diabetes mellitus: Secondary | ICD-10-CM | POA: Insufficient documentation

## 2018-05-28 DIAGNOSIS — M5116 Intervertebral disc disorders with radiculopathy, lumbar region: Secondary | ICD-10-CM | POA: Diagnosis not present

## 2018-05-28 DIAGNOSIS — Z90710 Acquired absence of both cervix and uterus: Secondary | ICD-10-CM | POA: Diagnosis not present

## 2018-05-28 DIAGNOSIS — G894 Chronic pain syndrome: Secondary | ICD-10-CM | POA: Insufficient documentation

## 2018-05-28 DIAGNOSIS — F419 Anxiety disorder, unspecified: Secondary | ICD-10-CM | POA: Insufficient documentation

## 2018-05-28 DIAGNOSIS — J449 Chronic obstructive pulmonary disease, unspecified: Secondary | ICD-10-CM | POA: Insufficient documentation

## 2018-05-28 DIAGNOSIS — I10 Essential (primary) hypertension: Secondary | ICD-10-CM | POA: Insufficient documentation

## 2018-05-28 DIAGNOSIS — Z7982 Long term (current) use of aspirin: Secondary | ICD-10-CM | POA: Insufficient documentation

## 2018-05-28 DIAGNOSIS — Z8673 Personal history of transient ischemic attack (TIA), and cerebral infarction without residual deficits: Secondary | ICD-10-CM | POA: Insufficient documentation

## 2018-05-28 DIAGNOSIS — M5136 Other intervertebral disc degeneration, lumbar region: Secondary | ICD-10-CM

## 2018-05-28 DIAGNOSIS — M4856XS Collapsed vertebra, not elsewhere classified, lumbar region, sequela of fracture: Secondary | ICD-10-CM | POA: Diagnosis not present

## 2018-05-28 DIAGNOSIS — M5416 Radiculopathy, lumbar region: Secondary | ICD-10-CM

## 2018-05-28 DIAGNOSIS — Z882 Allergy status to sulfonamides status: Secondary | ICD-10-CM | POA: Diagnosis not present

## 2018-05-28 DIAGNOSIS — K219 Gastro-esophageal reflux disease without esophagitis: Secondary | ICD-10-CM | POA: Insufficient documentation

## 2018-05-28 DIAGNOSIS — G9519 Other vascular myelopathies: Secondary | ICD-10-CM

## 2018-05-28 DIAGNOSIS — F17200 Nicotine dependence, unspecified, uncomplicated: Secondary | ICD-10-CM | POA: Diagnosis not present

## 2018-05-28 DIAGNOSIS — F329 Major depressive disorder, single episode, unspecified: Secondary | ICD-10-CM | POA: Insufficient documentation

## 2018-05-28 DIAGNOSIS — Z825 Family history of asthma and other chronic lower respiratory diseases: Secondary | ICD-10-CM | POA: Insufficient documentation

## 2018-05-28 DIAGNOSIS — Z9049 Acquired absence of other specified parts of digestive tract: Secondary | ICD-10-CM | POA: Diagnosis not present

## 2018-05-28 DIAGNOSIS — M545 Low back pain: Secondary | ICD-10-CM | POA: Diagnosis present

## 2018-05-28 MED ORDER — GABAPENTIN 300 MG PO CAPS: ORAL_CAPSULE | ORAL | 2 refills | Status: DC

## 2018-05-28 MED ORDER — HYDROCODONE-ACETAMINOPHEN 7.5-325 MG PO TABS: 1.0000 | ORAL_TABLET | Freq: Three times a day (TID) | ORAL | 0 refills | Status: DC | PRN

## 2018-05-28 NOTE — Progress Notes (Signed)
Patient's Name: Tammy Lamb  MRN: [id number]  Referring Provider: Guadlupe Spanish, MD  DOB: April 05, 1951  PCP: Guadlupe Spanish, MD  DOS: 05/28/2018  Note by: Gillis Santa, MD  Service setting: Ambulatory outpatient  Specialty: Interventional Pain Management  Location: [facility] (AMB) Pain Management Facility    Patient type: Established   Primary Reason(s) for Visit: Encounter for prescription drug management. (Level of risk: moderate)  CC: Back Pain (low, mid and upper)  HPI  Tammy Lamb is a 67 y.o. year old, female patient, who comes today for a medication management evaluation. 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 Her primarily concern today is the Back Pain (low, mid and upper)  Pain Assessment: Location: Upper, Lower, Mid Back Radiating: raidates down left leg on the side to feet, making pinnky toe numb Onset: More than a month ago Duration: Chronic pain Quality: Pressure, Tingling, Constant, Radiating, Numbness Severity: 8 /10 (subjective, self-reported pain score)  Note: Reported level is inconsistent with clinical observations.                         When using our objective Pain Scale, levels between 6 and 10/10 are said to belong in an emergency room, as it progressively worsens from a 6/10, described as severely limiting, requiring emergency care not usually available at an outpatient pain management facility. At a 6/10 level, communication becomes difficult and requires great effort. Assistance to reach the emergency department may be required. Facial flushing and profuse sweating along with potentially dangerous increases in heart rate and blood pressure will be evident. Effect on  ADL: "It makes me hurt so bad" Timing: Constant Modifying factors: medications help a little bit BP: (!) 141/93  HR: 60  Tammy Lamb was last scheduled for an appointment on 05/16/2018 for medication management. During today's appointment we reviewed Tammy Lamb's chronic pain status, as well as her outpatient medication regimen.  The patient  reports that she does not use drugs. Her body mass index is 33.41 kg/m.  Further details on both, my assessment(s), as well as the proposed treatment plan, please see below.  Controlled Substance Pharmacotherapy Assessment REMS (Risk Evaluation and Mitigation Strategy)  Analgesic: Hydrocodone 5 mg 3 times daily as needed, quantity 90/month MME/day: 15 mg/day.  Dewayne Shorter, RN  05/28/2018  9:26 AM  Sign at close encounter Nursing Pain Medication Assessment:  Safety precautions to be maintained throughout the outpatient stay will include: orient to surroundings, keep bed in low position, maintain call bell within reach at all times, provide assistance with transfer out of bed and ambulation.  Medication Inspection Compliance: Tammy Lamb did not comply with our request to bring her pills to be counted. She was reminded that bringing the medication bottles, even when empty, is a requirement.  Medication: None brought in. Pill/Patch Count: None available to be counted. Bottle Appearance: No container available. Did not bring bottle(s) to appointment. Filled Date: N/A Last Medication intake:  Yesterday   Pharmacokinetics: Liberation and absorption (onset of action): WNL Distribution (time to peak effect): WNL Metabolism and excretion (duration of action): WNL         Pharmacodynamics: Desired effects: Analgesia: Tammy Lamb reports >50% benefit. Functional ability: Patient reports that medication does help, but not nearly  as much as she would like Clinically meaningful improvement in function (CMIF): Sustained CMIF goals met Perceived effectiveness:  Described as ineffective and would like to make some changes Undesirable effects: Side-effects or Adverse reactions: None reported Monitoring: [place] PMP: Online review of the past 101-monthperiod conducted. Compliant with practice rules and regulations Last UDS on record: Summary  Date Value Ref Range Status  09/27/2017 FINAL  Final    Comment:    ==================================================================== [facility] COMP DRUG ANALYSIS,UR ==================================================================== Test                             Result       Flag       Units Drug Present and Declared for Prescription Verification   Noroxycodone                   193          EXPECTED   ng/mg creat    Noroxycodone is an expected metabolite of oxycodone. Sources of    oxycodone include scheduled prescription medications.   Citalopram                     PRESENT      EXPECTED   Desmethylcitalopram            PRESENT      EXPECTED    Desmethylcitalopram is an expected metabolite of citalopram or    the enantiomeric form, escitalopram.   Acetaminophen                  PRESENT      EXPECTED Drug Present not Declared for Prescription Verification   Salicylate                     PRESENT      UNEXPECTED Drug Absent but Declared for Prescription Verification   Oxycodone                      Not Detected UNEXPECTED ng/mg creat    Oxycodone is almost always present in patients taking this drug    consistently.  Absence of oxycodone could be due to lapse of time    since the last dose or unusual pharmacokinetics (rapid    metabolism).   Gabapentin                     Not Detected UNEXPECTED   Methocarbamol                  Not Detected UNEXPECTED ==================================================================== Test                      Result    Flag   Units      Ref Range   Creatinine              30               mg/dL       >=20 ==================================================================== Declared Medications:  The flagging and interpretation on this report are based on the  following declared medications.  Unexpected results may arise from  inaccuracies in the declared medications.  **Note: The testing scope of this panel includes these medications:  Citalopram (Celexa)  Gabapentin  Methocarbamol (Robaxin)  Oxycodone  **Note: The testing scope of this panel does not include small to  moderate amounts of these reported medications:  Acetaminophen  **Note: The testing scope of this panel does not include following  reported medications:  Atorvastatin (Lipitor)  Carvedilol (Coreg)  Hydrochlorothiazide (Hydrodiuril)  Melatonin  Omeprazole (Zegerid)  Phenylephrine  Ramipril (Altace)  Sodium Bicarbonate (Zegerid)  Sulfasalazine (Azulfidine)  Vitamin D3 ==================================================================== For clinical consultation, please call [phone]. ====================================================================    UDS interpretation: Compliant          Medication Assessment Form: Reviewed. Patient indicates being compliant with therapy Treatment compliance: Compliant Risk Assessment Profile: Aberrant behavior: See prior evaluations. None observed or detected today Comorbid factors increasing risk of overdose: See prior notes. No additional risks detected today Opioid risk tool (ORT) (Total Score): 0 Personal History of Substance Abuse (SUD-Substance use disorder):  Alcohol: Negative  Illegal Drugs: Negative  Rx Drugs: Negative  ORT Risk Level calculation: Low Risk Risk of substance use disorder (SUD): Low Opioid Risk Tool - 05/28/18 0926      Family History of Substance Abuse   Alcohol  Negative    Illegal Drugs  Negative    Rx Drugs  Negative      Personal History of Substance Abuse   Alcohol  Negative    Illegal Drugs  Negative    Rx Drugs   Negative      Age   Age between 62-45 years   No      History of Preadolescent Sexual Abuse   History of Preadolescent Sexual Abuse  Negative or Female      Psychological Disease   Psychological Disease  Negative    Depression  Negative      Total Score   Opioid Risk Tool Scoring  0    Opioid Risk Interpretation  Low Risk      ORT Scoring interpretation table:  Score <3 = Low Risk for SUD  Score between 4-7 = Moderate Risk for SUD  Score >8 = High Risk for Opioid Abuse   Risk Mitigation Strategies:  Patient Counseling: Covered Patient-Prescriber Agreement (PPA): Present and active  Notification to other healthcare providers: Done  Pharmacologic Plan: Increase to hydrocodone 7.5 mg 3 times daily as needed, quantity 90/month             Laboratory Chemistry  Inflammation Markers (CRP: Acute Phase) (ESR: Chronic Phase) No results found for: CRP, ESRSEDRATE, LATICACIDVEN                       Rheumatology Markers No results found for: RF, ANA, LABURIC, URICUR, LYMEIGGIGMAB, LYMEABIGMQN, HLAB27                      Renal Function Markers Lab Results  Component Value Date   BUN 13 05/28/2017   CREATININE 0.78 05/28/2017   GFRAA >60 05/28/2017   GFRNONAA >60 05/28/2017                             Hepatic Function Markers No results found for: AST, ALT, ALBUMIN, ALKPHOS, HCVAB, AMYLASE, LIPASE, AMMONIA                      Electrolytes Lab Results  Component Value Date   NA 134 (L) 05/28/2017   K 3.8 05/28/2017   CL 96 (L) 05/28/2017   CALCIUM 9.3 05/28/2017                        Neuropathy Markers No results  found for: VITAMINB12, FOLATE, HGBA1C, HIV                      CNS Tests No results found for: COLORCSF, APPEARCSF, RBCCOUNTCSF, WBCCSF, POLYSCSF, LYMPHSCSF, EOSCSF, PROTEINCSF, GLUCCSF, JCVIRUS, CSFOLI, IGGCSF                      Bone Pathology Markers No results found for: VD25OH, [id number], [id number], [id number], 25OHVITD1, 25OHVITD2, 25OHVITD3,  TESTOFREE, TESTOSTERONE                       Coagulation Parameters Lab Results  Component Value Date   INR 1.06 05/28/2017   LABPROT 13.7 05/28/2017   APTT 31 05/28/2017   PLT 394 05/28/2017                        Cardiovascular Markers Lab Results  Component Value Date   HGB 12.4 05/28/2017   HCT 37.1 05/28/2017                         CA Markers No results found for: CEA, CA125, LABCA2                      Note: Lab results reviewed.  Recent Diagnostic Imaging Results  MM 3D SCREEN BREAST BILATERAL CLINICAL DATA:  Screening.  EXAM: DIGITAL SCREENING BILATERAL MAMMOGRAM WITH TOMO AND CAD  COMPARISON:  Previous exam(s).  ACR Breast Density Category a: The breast tissue is almost entirely fatty.  FINDINGS: There are no findings suspicious for malignancy. Images were processed with CAD.  IMPRESSION: No mammographic evidence of malignancy. A result letter of this screening mammogram will be mailed directly to the patient.  RECOMMENDATION: Screening mammogram in one year. (Code:[id number])  BI-RADS CATEGORY  1: Negative.  Electronically Signed   By: Everlean Alstrom M.D.   On: 04/30/2018 14:29  Complexity Note: Imaging results reviewed. Results shared with Ms. Lathon, using Layman's terms.                         Meds   Current Outpatient Medications:  .  acetaminophen (TYLENOL) 500 MG tablet, Take 1,000 mg by mouth at bedtime as needed (pain)., Disp: , Rfl:  .  atorvastatin (LIPITOR) 80 MG tablet, Take 80 mg by mouth daily at 6 PM., Disp: , Rfl:  .  carvedilol (COREG) 25 MG tablet, Take 12.5 mg by mouth 2 (two) times daily., Disp: , Rfl:  .  Cholecalciferol (VITAMIN D3) 2000 units capsule, Take 2,000 Units by mouth at bedtime., Disp: , Rfl:  .  CINNAMON PO, Take 1,000 mg by mouth at bedtime., Disp: , Rfl:  .  citalopram (CELEXA) 20 MG tablet, Take 20 mg by mouth every evening., Disp: , Rfl:  .  gabapentin (NEURONTIN) 300 MG capsule, 300 mg day, 600 mg qhs,  Disp: 90 capsule, Rfl: 2 .  hydrochlorothiazide (HYDRODIURIL) 25 MG tablet, Take 25 mg by mouth daily., Disp: , Rfl:  .  Melatonin 10 MG TABS, Take 10 mg by mouth at bedtime as needed (sleep)., Disp: , Rfl:  .  methocarbamol (ROBAXIN) 750 MG tablet, Take 1 tablet (750 mg total) by mouth every 8 (eight) hours as needed for muscle spasms., Disp: 90 tablet, Rfl: 3 .  Omeprazole-Sodium Bicarbonate (ZEGERID OTC) 20-1100 MG CAPS capsule, Take 1 capsule by mouth 2 (two) times  daily., Disp: , Rfl:  .  phenylephrine (NEO-SYNEPHRINE) 1 % nasal spray, Place 1 drop into both nostrils daily as needed for congestion., Disp: , Rfl:  .  ramipril (ALTACE) 10 MG capsule, Take 10 mg by mouth daily., Disp: , Rfl:  .  sulfaSALAzine (AZULFIDINE) 500 MG tablet, Take 500 mg by mouth 2 (two) times daily., Disp: , Rfl:  .  aspirin (ECOTRIN LOW STRENGTH) 81 MG EC tablet, Take 81 mg by mouth daily. Swallow whole., Disp: , Rfl:  .  HYDROcodone-acetaminophen (NORCO) 7.5-325 MG tablet, Take 1 tablet by mouth every 8 (eight) hours as needed for moderate pain. For chronic pain To fill on or after: 06/16/2018, 07/15/2018, 08/14/2018, Disp: 90 tablet, Rfl: 0  ROS  Constitutional: Denies any fever or chills Gastrointestinal: No reported hemesis, hematochezia, vomiting, or acute GI distress Musculoskeletal: Denies any acute onset joint swelling, redness, loss of ROM, or weakness Neurological: No reported episodes of acute onset apraxia, aphasia, dysarthria, agnosia, amnesia, paralysis, loss of coordination, or loss of consciousness  Allergies  Ms. Uliano is allergic to sulfa antibiotics.  PFSH  Drug: Ms. Driggs  reports that she does not use drugs. Alcohol:  reports that she drinks alcohol. Tobacco:  reports that she has been smoking. She has a 49.00 pack-year smoking history. She has never used smokeless tobacco. Medical:  has a past medical history of Anxiety, Arthritis, Back pain, Colitis, ulcerative ([place]), COPD (chronic  obstructive pulmonary disease) ([place]), Depression, GERD (gastroesophageal reflux disease), Headache, Hypertension, Motion sickness, Stroke ([place]) (2015), TIA (transient ischemic attack) (2015), Urinary frequency, and Wears dentures. Surgical: Ms. Marines  has a past surgical history that includes Tonsillectomy; Cholecystectomy; Abdominal hysterectomy; Diagnostic laparoscopy; Bladder suspension; Colonoscopy; Upper gi endoscopy; Knee arthroscopy (Left, 02/13/2014); Ankle arthroscopy (Left, 03/22/2016); Tendon repair (Left, 03/22/2016); and Lumbar laminectomy/decompression microdiscectomy (N/A, 05/30/2017). Family: family history includes COPD in her father; Diabetes in her mother; Heart disease in her father; Hypertension in her father and mother.  Constitutional Exam  General appearance: alert, cooperative and in mild distress Vitals:   05/28/18 0920  BP: (!) 141/93  Pulse: 60  Resp: 18  Temp: 98.6 F (37 C)  SpO2: 100%  Weight: 207 lb (93.9 kg)  Height: 5' 6"  (1.676 m)   BMI Assessment: Estimated body mass index is 33.41 kg/m as calculated from the following:   Height as of this encounter: 5' 6"  (1.676 m).   Weight as of this encounter: 207 lb (93.9 kg).  BMI interpretation table: BMI level Category Range association with higher incidence of chronic pain  <18 kg/m2 Underweight   18.5-24.9 kg/m2 Ideal body weight   25-29.9 kg/m2 Overweight Increased incidence by 20%  30-34.9 kg/m2 Obese (Class I) Increased incidence by 68%  35-39.9 kg/m2 Severe obesity (Class II) Increased incidence by 136%  >40 kg/m2 Extreme obesity (Class III) Increased incidence by 254%   Patient's current BMI Ideal Body weight  Body mass index is 33.41 kg/m. Ideal body weight: 59.3 kg (130 lb 11.7 oz) Adjusted ideal body weight: 73.1 kg (161 lb 3.8 oz)   BMI Readings from Last 4 Encounters:  05/28/18 33.41 kg/m  04/10/18 34.86 kg/m  02/28/18 35.19 kg/m  01/03/18 33.15 kg/m   Wt Readings from Last 4  Encounters:  05/28/18 207 lb (93.9 kg)  04/10/18 216 lb (98 kg)  02/28/18 218 lb (98.9 kg)  01/03/18 218 lb (98.9 kg)  Psych/Mental status: Alert, oriented x 3 (person, place, & time)       Eyes: PERLA Respiratory:  No evidence of acute respiratory distress  Cervical Spine Area Exam  Skin & Axial Inspection: No masses, redness, edema, swelling, or associated skin lesions Alignment: Symmetrical Functional ROM: Unrestricted ROM      Stability: No instability detected Muscle Tone/Strength: Functionally intact. No obvious neuro-muscular anomalies detected. Sensory (Neurological): Unimpaired Palpation: No palpable anomalies              Upper Extremity (UE) Exam    Side: Right upper extremity  Side: Left upper extremity  Skin & Extremity Inspection: Skin color, temperature, and hair growth are WNL. No peripheral edema or cyanosis. No masses, redness, swelling, asymmetry, or associated skin lesions. No contractures.  Skin & Extremity Inspection: Skin color, temperature, and hair growth are WNL. No peripheral edema or cyanosis. No masses, redness, swelling, asymmetry, or associated skin lesions. No contractures.  Functional ROM: Unrestricted ROM          Functional ROM: Unrestricted ROM          Muscle Tone/Strength: Functionally intact. No obvious neuro-muscular anomalies detected.  Muscle Tone/Strength: Functionally intact. No obvious neuro-muscular anomalies detected.  Sensory (Neurological): Unimpaired          Sensory (Neurological): Unimpaired          Palpation: No palpable anomalies              Palpation: No palpable anomalies              Provocative Test(s):  Phalen's test: deferred Tinel's test: deferred Apley's scratch test (touch opposite shoulder):  Action 1 (Across chest): deferred Action 2 (Overhead): deferred Action 3 (LB reach): deferred   Provocative Test(s):  Phalen's test: deferred Tinel's test: deferred Apley's scratch test (touch opposite shoulder):  Action 1  (Across chest): deferred Action 2 (Overhead): deferred Action 3 (LB reach): deferred    Thoracic Spine Area Exam  Skin & Axial Inspection: No masses, redness, or swelling Alignment: Symmetrical Functional ROM: Unrestricted ROM Stability: No instability detected Muscle Tone/Strength: Functionally intact. No obvious neuro-muscular anomalies detected. Sensory (Neurological): Unimpaired Muscle strength & Tone: No palpable anomalies  Lumbar Spine Area Exam  Skin & Axial Inspection: Well healed scar from previous spine surgery detected Alignment: Scoliosis detected Functional ROM: Decreased ROM       Stability: No instability detected Muscle Tone/Strength: Functionally intact. No obvious neuro-muscular anomalies detected. Sensory (Neurological): Dermatomal pain pattern and musculoskeletal Palpation: No palpable anomalies       Provocative Tests: Hyperextension/rotation test: (+) bilaterally for facet joint pain. Lumbar quadrant test (Kemp's test): (+) bilateral for foraminal stenosis Lateral bending test: deferred today       Patrick's Maneuver: deferred today                   FABER test: deferred today                   S-I anterior distraction/compression test: deferred today         S-I lateral compression test: deferred today         S-I Thigh-thrust test: deferred today         S-I Gaenslen's test: deferred today          Gait & Posture Assessment  Ambulation: Limited Gait: Antalgic Posture: Difficulty with positional changes   Lower Extremity Exam    Side: Right lower extremity  Side: Left lower extremity  Stability: No instability observed          Stability: No instability observed  Skin & Extremity Inspection: Skin color, temperature, and hair growth are WNL. No peripheral edema or cyanosis. No masses, redness, swelling, asymmetry, or associated skin lesions. No contractures.  Skin & Extremity Inspection: Skin color, temperature, and hair growth are WNL. No  peripheral edema or cyanosis. No masses, redness, swelling, asymmetry, or associated skin lesions. No contractures.  Functional ROM: Decreased ROM for all joints of the lower extremity          Functional ROM: Decreased ROM for all joints of the lower extremity          Muscle Tone/Strength: Functionally intact. No obvious neuro-muscular anomalies detected.  Muscle Tone/Strength: Functionally intact. No obvious neuro-muscular anomalies detected.  Sensory (Neurological): Dermatomal pain pattern  Sensory (Neurological): Dermatomal pain pattern  Palpation: No palpable anomalies  Palpation: No palpable anomalies   Assessment  Primary Diagnosis & Pertinent Problem List: The primary encounter diagnosis was Lumbar radiculopathy. Diagnoses of Lumbar degenerative disc disease, Compression fracture of lumbar vertebra, non-traumatic, sequela, Spinal stenosis, lumbar region, with neurogenic claudication, Neurogenic claudication, and Chronic pain syndrome were also pertinent to this visit.  Status Diagnosis  Persistent Persistent Persistent 1. Lumbar radiculopathy   2. Lumbar degenerative disc disease   3. Compression fracture of lumbar vertebra, non-traumatic, sequela   4. Spinal stenosis, lumbar region, with neurogenic claudication   5. Neurogenic claudication   6. Chronic pain syndrome      General Recommendations: The pain condition that the patient suffers from is best treated with a multidisciplinary approach that involves an increase in physical activity to prevent de-conditioning and worsening of the pain cycle, as well as psychological counseling (formal and/or informal) to address the co-morbid psychological affects of pain. Treatment will often involve judicious use of pain medications and interventional procedures to decrease the pain, allowing the patient to participate in the physical activity that will ultimately produce long-lasting pain reductions. The goal of the multidisciplinary  approach is to return the patient to a higher level of overall function and to restore their ability to perform activities of daily living.  Patient is a very pleasant 67 year old female who presents with a chief complaint of axial low back pain that radiates down into her bilateral buttocks and hipsstatus post L3-S1 lumbar laminectomy and decompression. She has had ongoing axial low back pain along with weakness of her lower extremities secondary to spinal stenosis and neurogenic claudication. Repeat spine MRI after surgery does not show any new or worsening compression. Nerve conduction shows evidence of chronic L4, L5radiculopathy. She endorses numbness and tingling in her lower extremities and finds it difficult to walk for an extended period of time.  Patient is status post a intradiscal injection at L5-S1 with Dr. Rolena Infante.  She states that this procedure was very painful.  She is unclear what was recommended after this procedure from the radiologist and neurosurgeon.  Given the patient's chronic persistent radicular pain, we discussed caudal epidural steroid injection.  Risks and benefits of this procedure were discussed and patient would like to proceed.  We also touched on spinal cord stimulation.  Patient will continue to think about this further.  If patient would like to proceed with trial, she will need thoracic MRI as well as psychological evaluation.  Regards to medication management, patient is not finding any benefit with Butrans patch for long-acting pain control.  Given that she does not find this medication effective in managing her long-term pain, I will have her discontinue and we will increase her hydrocodone to 7.5 mg-3  times daily as needed.  I recommended the patient continue her gabapentin as prescribed.  We will refill as below.  Plan: -Increase hydrocodone to 7.5 mg 3 times daily as needed (patient will bring previous prescription bottle before prescriptions are released  to her) -Continue gabapentin as below -Continue Robaxin as prescribed, no refill needed -Schedule for caudal ESI with sedation -Discussed spinal cord stimulation however majority of patient's symptoms seem related to spinal stenosis and neurogenic claudication (which SCS typically will not help) as well as chronic lumbar radiculopathy. -Medication management with Crystal.  Okay to explore other opioid options however MME should stay below 50-60.  Non-opioid considerations include Lyrica, Cymbalta.  Plan of Care  Pharmacotherapy (Medications Ordered): Meds ordered this encounter  Medications  . gabapentin (NEURONTIN) 300 MG capsule    Sig: 300 mg day, 600 mg qhs    Dispense:  90 capsule    Refill:  2    Do not place this medication, or any other prescription from our practice, on "Automatic Refill". Patient may have prescription filled one day early if pharmacy is closed on scheduled refill date.  Marland Kitchen DISCONTD: HYDROcodone-acetaminophen (NORCO) 7.5-325 MG tablet    Sig: Take 1 tablet by mouth every 8 (eight) hours as needed for moderate pain. For chronic pain To fill on or after: 06/16/2018, 07/15/2018, 08/14/2018    Dispense:  90 tablet    Refill:  0    Do not place this medication, or any other prescription from our practice, on "Automatic Refill". Patient may have prescription filled one day early if pharmacy is closed on scheduled refill date. Do not fill until:  To last until:  . DISCONTD: HYDROcodone-acetaminophen (NORCO) 7.5-325 MG tablet    Sig: Take 1 tablet by mouth every 8 (eight) hours as needed for moderate pain. For chronic pain To fill on or after: 06/16/2018, 07/15/2018, 08/14/2018    Dispense:  90 tablet    Refill:  0    Do not place this medication, or any other prescription from our practice, on "Automatic Refill". Patient may have prescription filled one day early if pharmacy is closed on scheduled refill date. Do not fill until:  To last until:  .  HYDROcodone-acetaminophen (NORCO) 7.5-325 MG tablet    Sig: Take 1 tablet by mouth every 8 (eight) hours as needed for moderate pain. For chronic pain To fill on or after: 06/16/2018, 07/15/2018, 08/14/2018    Dispense:  90 tablet    Refill:  0    Do not place this medication, or any other prescription from our practice, on "Automatic Refill". Patient may have prescription filled one day early if pharmacy is closed on scheduled refill date. Do not fill until:  To last until:   Lab-work, procedure(s), and/or referral(s): Orders Placed This Encounter  Procedures  . Caudal Epidural Injection    Time Note: Greater than 50% of the 25 minute(s) of face-to-face time spent with Ms. Tash, was spent in counseling/coordination of care regarding: Ms. Tallman primary cause of pain, the results of her recent test(s), the treatment plan, treatment alternatives, the risks and possible complications of proposed treatment, medication side effects, going over the informed consent, the opioid analgesic risks and possible complications, the appropriate use of her medications, realistic expectations, the goals of pain management (increased in functionality), the need to bring and keep the BMI below 30, the medication agreement and the patient's responsibilities when it comes to controlled substances.  Provider-requested follow-up: Return in about 3 months (around 08/28/2018)  for MM with Crystal.  No future appointments.  Primary Care Physician: Guadlupe Spanish, MD Location: [facility] Note by: Gillis Santa, M.D Date: 05/28/2018; Time: 10:05 AM  Patient Instructions  1.  Caudal ESI with sedation 2.  Please bring in previous hydrocodone prescription bottle before we can release new prescription 3.  Refill of gabapentin, E prescribed

## 2018-05-28 NOTE — Progress Notes (Signed)
Nursing Pain Medication Assessment:  Safety precautions to be maintained throughout the outpatient stay will include: orient to surroundings, keep bed in low position, maintain call bell within reach at all times, provide assistance with transfer out of bed and ambulation.  Medication Inspection Compliance: Tammy Lamb did not comply with our request to bring her pills to be counted. She was reminded that bringing the medication bottles, even when empty, is a requirement.  Medication: None brought in. Pill/Patch Count: None available to be counted. Bottle Appearance: No container available. Did not bring bottle(s) to appointment. Filled Date: N/A Last Medication intake:  Yesterday

## 2018-05-28 NOTE — Patient Instructions (Addendum)
You were handed 3 scripts for Hydrocodone today after bringing your pills for count.    1.  Caudal ESI with sedation 2.  Please bring in previous hydrocodone prescription bottle before we can release new prescription 3.  Refill of gabapentin, E prescribed Epidural Steroid Injection Patient Information  Description: The epidural space surrounds the nerves as they exit the spinal cord.  In some patients, the nerves can be compressed and inflamed by a bulging disc or a tight spinal canal (spinal stenosis).  By injecting steroids into the epidural space, we can bring irritated nerves into direct contact with a potentially helpful medication.  These steroids act directly on the irritated nerves and can reduce swelling and inflammation which often leads to decreased pain.  Epidural steroids may be injected anywhere along the spine and from the neck to the low back depending upon the location of your pain.   After numbing the skin with local anesthetic (like Novocaine), a small needle is passed into the epidural space slowly.  You may experience a sensation of pressure while this is being done.  The entire block usually last less than 10 minutes.  Conditions which may be treated by epidural steroids:   Low back and leg pain  Neck and arm pain  Spinal stenosis  Post-laminectomy syndrome  Herpes zoster (shingles) pain  Pain from compression fractures  Preparation for the injection:  1. Do not eat any solid food or dairy products within 8 hours of your appointment.  2. You may drink clear liquids up to 3 hours before appointment.  Clear liquids include water, black coffee, juice or soda.  No milk or cream please. 3. You may take your regular medication, including pain medications, with a sip of water before your appointment  Diabetics should hold regular insulin (if taken separately) and take 1/2 normal NPH dos the morning of the procedure.  Carry some sugar containing items with you to your  appointment. 4. A driver must accompany you and be prepared to drive you home after your procedure.  5. Bring all your current medications with your. 6. An IV may be inserted and sedation may be given at the discretion of the physician.   7. A blood pressure cuff, EKG and other monitors will often be applied during the procedure.  Some patients may need to have extra oxygen administered for a short period. 8. You will be asked to provide medical information, including your allergies, prior to the procedure.  We must know immediately if you are taking blood thinners (like Coumadin/Warfarin)  Or if you are allergic to IV iodine contrast (dye). We must know if you could possible be pregnant.  Possible side-effects:  Bleeding from needle site  Infection (rare, may require surgery)  Nerve injury (rare)  Numbness & tingling (temporary)  Difficulty urinating (rare, temporary)  Spinal headache ( a headache worse with upright posture)  Light -headedness (temporary)  Pain at injection site (several days)  Decreased blood pressure (temporary)  Weakness in arm/leg (temporary)  Pressure sensation in back/neck (temporary)  Call if you experience:  Fever/chills associated with headache or increased back/neck pain.  Headache worsened by an upright position.  New onset weakness or numbness of an extremity below the injection site  Hives or difficulty breathing (go to the emergency room)  Inflammation or drainage at the infection site  Severe back/neck pain  Any new symptoms which are concerning to you  Please note:  Although the local anesthetic injected can often make  your back or neck feel good for several hours after the injection, the pain will likely return.  It takes 3-7 days for steroids to work in the epidural space.  You may not notice any pain relief for at least that one week.  If effective, we will often do a series of three injections spaced 3-6 weeks apart to maximally  decrease your pain.  After the initial series, we generally will wait several months before considering a repeat injection of the same type.  If you have any questions, please call [phone] [place]  What are the risk, side effects and possible complications? Generally speaking, most procedures are safe.  However, with any procedure there are risks, side effects, and the possibility of complications.  The risks and complications are dependent upon the sites that are lesioned, or the type of nerve block to be performed.  The closer the procedure is to the spine, the more serious the risks are.  Great care is taken when placing the radio frequency needles, block needles or lesioning probes, but sometimes complications can occur. 1. Infection: Any time there is an injection through the skin, there is a risk of infection.  This is why sterile conditions are used for these blocks.  There are four possible types of infection. 1. Localized skin infection. 2. Central Nervous System Infection-This can be in the form of Meningitis, which can be deadly. 3. Epidural Infections-This can be in the form of an epidural abscess, which can cause pressure inside of the spine, causing compression of the spinal cord with subsequent paralysis. This would require an emergency surgery to decompress, and there are no guarantees that the patient would recover from the paralysis. 4. Discitis-This is an infection of the intervertebral discs.  It occurs in about 1% of discography procedures.  It is difficult to treat and it may lead to surgery.        2. Pain: the needles have to go through skin and soft tissues, will cause soreness.       3. Damage to internal structures:  The nerves to be lesioned may be near blood vessels or    other nerves which can be potentially damaged.       4. Bleeding: Bleeding is more common if the patient is taking blood  thinners such as  aspirin, Coumadin, Ticiid, Plavix, etc., or if he/she have some genetic predisposition  such as hemophilia. Bleeding into the spinal canal can cause compression of the spinal  cord with subsequent paralysis.  This would require an emergency surgery to  decompress and there are no guarantees that the patient would recover from the  paralysis.       5. Pneumothorax:  Puncturing of a lung is a possibility, every time a needle is introduced in  the area of the chest or upper back.  Pneumothorax refers to free air around the  collapsed lung(s), inside of the thoracic cavity (chest cavity).  Another two possible  complications related to a similar event would include: Hemothorax and Chylothorax.   These are variations of the Pneumothorax, where instead of air around the collapsed  lung(s), you may have blood or chyle, respectively.       6. Spinal headaches: They may occur with any procedures in the area of the spine.       7. Persistent CSF (Cerebro-Spinal Fluid) leakage: This is a rare problem, but may occur  with prolonged  intrathecal or epidural catheters either due to the formation of a fistulous  track or a dural tear.       8. Nerve damage: By working so close to the spinal cord, there is always a possibility of  nerve damage, which could be as serious as a permanent spinal cord injury with  paralysis.       9. Death:  Although rare, severe deadly allergic reactions known as "Anaphylactic  reaction" can occur to any of the medications used.      10. Worsening of the symptoms:  We can always make thing worse.  What are the chances of something like this happening? Chances of any of this occuring are extremely low.  By statistics, you have more of a chance of getting killed in a motor vehicle accident: while driving to the hospital than any of the above occurring .  Nevertheless, you should be aware that they are possibilities.  In general, it is similar to taking a shower.  Everybody knows  that you can slip, hit your head and get killed.  Does that mean that you should not shower again?  Nevertheless always keep in mind that statistics do not mean anything if you happen to be on the wrong side of them.  Even if a procedure has a 1 (one) in a 1,000,000 (million) chance of going wrong, it you happen to be that one..Also, keep in mind that by statistics, you have more of a chance of having something go wrong when taking medications.  Who should not have this procedure? If you are on a blood thinning medication (e.g. Coumadin, Plavix, see list of "Blood Thinners"), or if you have an active infection going on, you should not have the procedure.  If you are taking any blood thinners, please inform your physician.  How should I prepare for this procedure?  Do not eat or drink anything at least six hours prior to the procedure.  Bring a driver with you .  It cannot be a taxi.  Come accompanied by an adult that can drive you back, and that is strong enough to help you if your legs get weak or numb from the local anesthetic.  Take all of your medicines the morning of the procedure with just enough water to swallow them.  If you have diabetes, make sure that you are scheduled to have your procedure done first thing in the morning, whenever possible.  If you have diabetes, take only half of your insulin dose and notify our nurse that you have done so as soon as you arrive at the clinic.  If you are diabetic, but only take blood sugar pills (oral hypoglycemic), then do not take them on the morning of your procedure.  You may take them after you have had the procedure.  Do not take aspirin or any aspirin-containing medications, at least eleven (11) days prior to the procedure.  They may prolong bleeding.  Wear loose fitting clothing that may be easy to take off and that you would not mind if it got stained with Betadine or blood.  Do not wear any jewelry or perfume  Remove any nail  coloring.  It will interfere with some of our monitoring equipment.  NOTE: Remember that this is not meant to be interpreted as a complete list of all possible complications.  Unforeseen problems may occur.  BLOOD THINNERS The following drugs contain aspirin or other products, which can cause increased bleeding during surgery and should  not be taken for 2 weeks prior to and 1 week after surgery.  If you should need take something for relief of minor pain, you may take acetaminophen which is found in Tylenol,m Datril, Anacin-3 and Panadol. It is not blood thinner. The products listed below are.  Do not take any of the products listed below in addition to any listed on your instruction sheet.  A.P.C or A.P.C with Codeine Codeine Phosphate Capsules #3 Ibuprofen Ridaura  ABC compound Congesprin Imuran rimadil  Advil Cope Indocin Robaxisal  Alka-Seltzer Effervescent Pain Reliever and Antacid Coricidin or Coricidin-D  Indomethacin Rufen  Alka-Seltzer plus Cold Medicine Cosprin Ketoprofen S-A-C Tablets  Anacin Analgesic Tablets or Capsules Coumadin Korlgesic Salflex  Anacin Extra Strength Analgesic tablets or capsules CP-2 Tablets Lanoril Salicylate  Anaprox Cuprimine Capsules Levenox Salocol  Anexsia-D Dalteparin Magan Salsalate  Anodynos Darvon compound Magnesium Salicylate Sine-off  Ansaid Dasin Capsules Magsal Sodium Salicylate  Anturane Depen Capsules Marnal Soma  APF Arthritis pain formula Dewitt's Pills Measurin Stanback  Argesic Dia-Gesic Meclofenamic Sulfinpyrazone  Arthritis Bayer Timed Release Aspirin Diclofenac Meclomen Sulindac  Arthritis pain formula Anacin Dicumarol Medipren Supac  Analgesic (Safety coated) Arthralgen Diffunasal Mefanamic Suprofen  Arthritis Strength Bufferin Dihydrocodeine Mepro Compound Suprol  Arthropan liquid Dopirydamole Methcarbomol with Aspirin Synalgos  ASA tablets/Enseals Disalcid Micrainin Tagament  Ascriptin Doan's Midol Talwin  Ascriptin A/D Dolene  Mobidin Tanderil  Ascriptin Extra Strength Dolobid Moblgesic Ticlid  Ascriptin with Codeine Doloprin or Doloprin with Codeine Momentum Tolectin  Asperbuf Duoprin Mono-gesic Trendar  Aspergum Duradyne Motrin or Motrin IB Triminicin  Aspirin plain, buffered or enteric coated Durasal Myochrisine Trigesic  Aspirin Suppositories Easprin Nalfon Trillsate  Aspirin with Codeine Ecotrin Regular or Extra Strength Naprosyn Uracel  Atromid-S Efficin Naproxen Ursinus  Auranofin Capsules Elmiron Neocylate Vanquish  Axotal Emagrin Norgesic Verin  Azathioprine Empirin or Empirin with Codeine Normiflo Vitamin E  Azolid Emprazil Nuprin Voltaren  Bayer Aspirin plain, buffered or children's or timed BC Tablets or powders Encaprin Orgaran Warfarin Sodium  Buff-a-Comp Enoxaparin Orudis Zorpin  Buff-a-Comp with Codeine Equegesic Os-Cal-Gesic   Buffaprin Excedrin plain, buffered or Extra Strength Oxalid   Bufferin Arthritis Strength Feldene Oxphenbutazone   Bufferin plain or Extra Strength Feldene Capsules Oxycodone with Aspirin   Bufferin with Codeine Fenoprofen Fenoprofen Pabalate or Pabalate-SF   Buffets II Flogesic Panagesic   Buffinol plain or Extra Strength Florinal or Florinal with Codeine Panwarfarin   Buf-Tabs Flurbiprofen Penicillamine   Butalbital Compound Four-way cold tablets Penicillin   Butazolidin Fragmin Pepto-Bismol   Carbenicillin Geminisyn Percodan   Carna Arthritis Reliever Geopen Persantine   Carprofen Gold's salt Persistin   Chloramphenicol Goody's Phenylbutazone   Chloromycetin Haltrain Piroxlcam   Clmetidine heparin Plaquenil   Cllnoril Hyco-pap Ponstel   Clofibrate Hydroxy chloroquine Propoxyphen         Before stopping any of these medications, be sure to consult the physician who ordered them.  Some, such as Coumadin (Warfarin) are ordered to prevent or treat serious conditions such as "deep thrombosis", "pumonary embolisms", and other heart problems.  The amount of time that  you may need off of the medication may also vary with the medication and the reason for which you were taking it.  If you are taking any of these medications, please make sure you notify your pain physician before you undergo any procedures.      Moderate Conscious Sedation, Adult Sedation is the use of medicines to promote relaxation and relieve discomfort and anxiety. Moderate conscious sedation is a  type of sedation. Under moderate conscious sedation, you are less alert than normal, but you are still able to respond to instructions, touch, or both. Moderate conscious sedation is used during short medical and dental procedures. It is milder than deep sedation, which is a type of sedation under which you cannot be easily woken up. It is also milder than general anesthesia, which is the use of medicines to make you unconscious. Moderate conscious sedation allows you to return to your regular activities sooner. Tell a health care provider about:  Any allergies you have.  All medicines you are taking, including vitamins, herbs, eye drops, creams, and over-the-counter medicines.  Use of steroids (by mouth or creams).  Any problems you or family members have had with sedatives and anesthetic medicines.  Any blood disorders you have.  Any surgeries you have had.  Any medical conditions you have, such as sleep apnea.  Whether you are pregnant or may be pregnant.  Any use of cigarettes, alcohol, marijuana, or street drugs. What are the risks? Generally, this is a safe procedure. However, problems may occur, including:  Getting too much medicine (oversedation).  Nausea.  Allergic reaction to medicines.  Trouble breathing. If this happens, a breathing tube may be used to help with breathing. It will be removed when you are awake and breathing on your own.  Heart trouble.  Lung trouble.  What happens before the procedure? Staying hydrated Follow instructions from your health care  provider about hydration, which may include:  Up to 2 hours before the procedure - you may continue to drink clear liquids, such as water, clear fruit juice, black coffee, and plain tea.  Eating and drinking restrictions Follow instructions from your health care provider about eating and drinking, which may include:  8 hours before the procedure - stop eating heavy meals or foods such as meat, fried foods, or fatty foods.  6 hours before the procedure - stop eating light meals or foods, such as toast or cereal.  6 hours before the procedure - stop drinking milk or drinks that contain milk.  2 hours before the procedure - stop drinking clear liquids.  Medicine  Ask your health care provider about:  Changing or stopping your regular medicines. This is especially important if you are taking diabetes medicines or blood thinners.  Taking medicines such as aspirin and ibuprofen. These medicines can thin your blood. Do not take these medicines before your procedure if your health care provider instructs you not to.  Tests and exams  You will have a physical exam.  You may have blood tests done to show: ? How well your kidneys and liver are working. ? How well your blood can clot. General instructions  Plan to have someone take you home from the hospital or clinic.  If you will be going home right after the procedure, plan to have someone with you for 24 hours. What happens during the procedure?  An IV tube will be inserted into one of your veins.  Medicine to help you relax (sedative) will be given through the IV tube.  The medical or dental procedure will be performed. What happens after the procedure?  Your blood pressure, heart rate, breathing rate, and blood oxygen level will be monitored often until the medicines you were given have worn off.  Do not drive for 24 hours. This information is not intended to replace advice given to you by your health care provider. Make sure  you discuss any questions  you have with your health care provider. Document Released: 04/04/2001 Document Revised: 12/14/2015 Document Reviewed: 10/30/2015 Elsevier Interactive Patient Education  [employer].

## 2018-05-28 NOTE — Progress Notes (Signed)
Nursing Pain Medication Assessment:  Safety precautions to be maintained throughout the outpatient stay will include: orient to surroundings, keep bed in low position, maintain call bell within reach at all times, provide assistance with transfer out of bed and ambulation.  Medication Inspection Compliance: Pill count conducted under aseptic conditions, in front of the patient. Neither the pills nor the bottle was removed from the patient's sight at any time. Once count was completed pills were immediately returned to the patient in their original bottle.  Medication: Hydrocodone/APAP Pill/Patch Count: 67 of 90 pills remain Pill/Patch Appearance: Markings consistent with prescribed medication Bottle Appearance: Standard pharmacy container. Clearly labeled. Filled Date: 10/ 24 / 2019 Last Medication intake:  Today

## 2018-06-12 ENCOUNTER — Ambulatory Visit (HOSPITAL_BASED_OUTPATIENT_CLINIC_OR_DEPARTMENT_OTHER): Payer: Medicare Other | Admitting: Student in an Organized Health Care Education/Training Program

## 2018-06-12 ENCOUNTER — Other Ambulatory Visit: Payer: Self-pay

## 2018-06-12 ENCOUNTER — Encounter: Payer: Self-pay | Admitting: Student in an Organized Health Care Education/Training Program

## 2018-06-12 ENCOUNTER — Ambulatory Visit
Admission: RE | Admit: 2018-06-12 | Discharge: 2018-06-12 | Disposition: A | Discharge status: Home / Self Care | Payer: Medicare Other | Source: Ambulatory Visit | Attending: Student in an Organized Health Care Education/Training Program | Admitting: Student in an Organized Health Care Education/Training Program

## 2018-06-12 VITALS — BP 107/71 | HR 61 | Temp 98.7°F | Resp 16 | Ht 64.0 in | Wt 207.0 lb

## 2018-06-12 DIAGNOSIS — Z882 Allergy status to sulfonamides status: Secondary | ICD-10-CM | POA: Diagnosis not present

## 2018-06-12 DIAGNOSIS — M5416 Radiculopathy, lumbar region: Secondary | ICD-10-CM

## 2018-06-12 DIAGNOSIS — Z9049 Acquired absence of other specified parts of digestive tract: Secondary | ICD-10-CM | POA: Diagnosis not present

## 2018-06-12 DIAGNOSIS — Z9889 Other specified postprocedural states: Secondary | ICD-10-CM | POA: Insufficient documentation

## 2018-06-12 DIAGNOSIS — M545 Low back pain: Secondary | ICD-10-CM | POA: Diagnosis present

## 2018-06-12 MED ORDER — SODIUM CHLORIDE 0.9% FLUSH
2.0000 mL | Freq: Once | INTRAVENOUS | Status: AC
  Administered 2018-06-12: 10 mL

## 2018-06-12 MED ORDER — IOPAMIDOL (ISOVUE-M 200) INJECTION 41%
10.0000 mL | Freq: Once | INTRAMUSCULAR | Status: AC
  Administered 2018-06-12: 10 mL via EPIDURAL
  Filled 2018-06-12: qty 10

## 2018-06-12 MED ORDER — DEXAMETHASONE SODIUM PHOSPHATE 10 MG/ML IJ SOLN
10.0000 mg | Freq: Once | INTRAMUSCULAR | Status: AC
  Administered 2018-06-12: 10 mg
  Filled 2018-06-12: qty 1

## 2018-06-12 MED ORDER — LIDOCAINE HCL 2 % IJ SOLN
10.0000 mL | Freq: Once | INTRAMUSCULAR | Status: AC
  Administered 2018-06-12: 400 mg
  Filled 2018-06-12: qty 20

## 2018-06-12 MED ORDER — LACTATED RINGERS IV SOLN
1000.0000 mL | Freq: Once | INTRAVENOUS | Status: AC
  Administered 2018-06-12: 1000 mL via INTRAVENOUS

## 2018-06-12 MED ORDER — FENTANYL CITRATE (PF) 100 MCG/2ML IJ SOLN
25.0000 ug | INTRAMUSCULAR | Status: DC | PRN
  Administered 2018-06-12: 50 ug via INTRAVENOUS
  Filled 2018-06-12: qty 2

## 2018-06-12 MED ORDER — ROPIVACAINE HCL 2 MG/ML IJ SOLN
2.0000 mL | Freq: Once | INTRAMUSCULAR | Status: AC
  Administered 2018-06-12: 10 mL via EPIDURAL
  Filled 2018-06-12: qty 10

## 2018-06-12 NOTE — Progress Notes (Signed)
Patient's Name: Tammy Lamb  MRN: [id number]  Referring Provider: Guadlupe Spanish, MD  DOB: 02-13-1951  PCP: Guadlupe Spanish, MD  DOS: 06/12/2018  Note by: Gillis Santa, MD  Service setting: Ambulatory outpatient  Specialty: Interventional Pain Management  Patient type: Established  Location: [facility] (AMB) Pain Management Facility  Visit type: Interventional Procedure   Primary Reason for Visit: Interventional Pain Management Treatment. CC: Back Pain (low) and Hip Pain (bilateral)  Procedure:          Anesthesia, Analgesia, Anxiolysis:  Type: Diagnostic Epidural Steroid Injection #1  Region: Caudal Level: Sacrococcygeal   Laterality: Midline       Type: Moderate (Conscious) Sedation combined with Local Anesthesia Indication(s): Analgesia and Anxiety Route: Intravenous (IV) IV Access: Secured Sedation: Meaningful verbal contact was maintained at all times during the procedure  Local Anesthetic: Lidocaine 1-2%  Position: Prone   Indications: 1. Lumbar radiculopathy    Pain Score: Pre-procedure: 9 /10 Post-procedure: 0-No pain/10  Pre-op Assessment:  Tammy Lamb is a 67 y.o. (year old), female patient, seen today for interventional treatment. She  has a past surgical history that includes Tonsillectomy; Cholecystectomy; Abdominal hysterectomy; Diagnostic laparoscopy; Bladder suspension; Colonoscopy; Upper gi endoscopy; Knee arthroscopy (Left, 02/13/2014); Ankle arthroscopy (Left, 03/22/2016); Tendon repair (Left, 03/22/2016); and Lumbar laminectomy/decompression microdiscectomy (N/A, 05/30/2017). Tammy Lamb has a current medication list which includes the following prescription(s): acetaminophen, aspirin, atorvastatin, carvedilol, vitamin d3, cinnamon, citalopram, gabapentin, hydrochlorothiazide, hydrocodone-acetaminophen, melatonin, methocarbamol, omeprazole-sodium bicarbonate, phenylephrine, ramipril, and sulfasalazine, and the following Facility-Administered Medications: fentanyl. Her  primarily concern today is the Back Pain (low) and Hip Pain (bilateral)  Initial Vital Signs:  Pulse/HCG Rate: 61ECG Heart Rate: 63 Temp: 98.7 F (37.1 C) Resp: 18 BP: (!) 129/101(left arm) SpO2: 96 %  BMI: Estimated body mass index is 35.53 kg/m as calculated from the following:   Height as of this encounter: 5' 4"  (1.626 m).   Weight as of this encounter: 207 lb (93.9 kg).  Risk Assessment: Allergies: Reviewed. She is allergic to sulfa antibiotics.  Allergy Precautions: None required Coagulopathies: Reviewed. None identified.  Blood-thinner therapy: None at this time Active Infection(s): Reviewed. None identified. Tammy Lamb is afebrile  Site Confirmation: Tammy Lamb was asked to confirm the procedure and laterality before marking the site Procedure checklist: Completed Consent: Before the procedure and under the influence of no sedative(s), amnesic(s), or anxiolytics, the patient was informed of the treatment options, risks and possible complications. To fulfill our ethical and legal obligations, as recommended by the American Medical Association's Code of Ethics, I have informed the patient of my clinical impression; the nature and purpose of the treatment or procedure; the risks, benefits, and possible complications of the intervention; the alternatives, including doing nothing; the risk(s) and benefit(s) of the alternative treatment(s) or procedure(s); and the risk(s) and benefit(s) of doing nothing. The patient was provided information about the general risks and possible complications associated with the procedure. These may include, but are not limited to: failure to achieve desired goals, infection, bleeding, organ or nerve damage, allergic reactions, paralysis, and death. In addition, the patient was informed of those risks and complications associated to Spine-related procedures, such as failure to decrease pain; infection (i.e.: Meningitis, epidural or intraspinal abscess);  bleeding (i.e.: epidural hematoma, subarachnoid hemorrhage, or any other type of intraspinal or peri-dural bleeding); organ or nerve damage (i.e.: Any type of peripheral nerve, nerve root, or spinal cord injury) with subsequent damage to sensory, motor, and/or autonomic systems, resulting in permanent pain,  numbness, and/or weakness of one or several areas of the body; allergic reactions; (i.e.: anaphylactic reaction); and/or death. Furthermore, the patient was informed of those risks and complications associated with the medications. These include, but are not limited to: allergic reactions (i.e.: anaphylactic or anaphylactoid reaction(s)); adrenal axis suppression; blood sugar elevation that in diabetics may result in ketoacidosis or comma; water retention that in patients with history of congestive heart failure may result in shortness of breath, pulmonary edema, and decompensation with resultant heart failure; weight gain; swelling or edema; medication-induced neural toxicity; particulate matter embolism and blood vessel occlusion with resultant organ, and/or nervous system infarction; and/or aseptic necrosis of one or more joints. Finally, the patient was informed that Medicine is not an exact science; therefore, there is also the possibility of unforeseen or unpredictable risks and/or possible complications that may result in a catastrophic outcome. The patient indicated having understood very clearly. We have given the patient no guarantees and we have made no promises. Enough time was given to the patient to ask questions, all of which were answered to the patient's satisfaction. [name] has indicated that she wanted to continue with the procedure. Attestation: I, the ordering provider, attest that I have discussed with the patient the benefits, risks, side-effects, alternatives, likelihood of achieving goals, and potential problems during recovery for the procedure that I have provided informed  consent. Date  Time: 06/12/2018  9:21 AM  Pre-Procedure Preparation:  Monitoring: As per clinic protocol. Respiration, ETCO2, SpO2, BP, heart rate and rhythm monitor placed and checked for adequate function Safety Precautions: Patient was assessed for positional comfort and pressure points before starting the procedure. Time-out: I initiated and conducted the "Time-out" before starting the procedure, as per protocol. The patient was asked to participate by confirming the accuracy of the "Time Out" information. Verification of the correct person, site, and procedure were performed and confirmed by me, the nursing staff, and the patient. "Time-out" conducted as per Joint Commission's Universal Protocol (UP.01.01.01). Time: 0957  Description of Procedure:          Target Area: Caudal Epidural Canal. Approach: Midline approach. Area Prepped: Entire Posterior Sacrococcygeal Region Prepping solution: ChloraPrep (2% chlorhexidine gluconate and 70% isopropyl alcohol) Safety Precautions: Aspiration looking for blood return was conducted prior to all injections. At no point did we inject any substances, as a needle was being advanced. No attempts were made at seeking any paresthesias. Safe injection practices and needle disposal techniques used. Medications properly checked for expiration dates. SDV (single dose vial) medications used. Description of the Procedure: Protocol guidelines were followed. The patient was placed in position over the fluoroscopy table. The target area was identified and the area prepped in the usual manner. Skin & deeper tissues infiltrated with local anesthetic. Appropriate amount of time allowed to pass for local anesthetics to take effect. The procedure needles were then advanced to the target area. Proper needle placement secured. Negative aspiration confirmed. Solution injected in intermittent fashion, asking for systemic symptoms every 0.5cc of injectate. The needles were then  removed and the area cleansed, making sure to leave some of the prepping solution back to take advantage of its long term bactericidal properties. Vitals:   06/12/18 1009 06/12/18 1019 06/12/18 1029 06/12/18 1040  BP: (!) 155/89 119/81 113/64 107/71  Pulse:      Resp: 15 16 14 16   Temp:      TempSrc:      SpO2: 93% 95% 94% 94%  Weight:  Height:        Start Time: 0957 hrs. End Time:   hrs. Materials:  Needle(s) Type: Epidural needle Gauge: 22G Length: 3.5-in Medication(s): Please see orders for medications and dosing details. 8 CC solution consisting of 5 cc of preservative-free saline, 2 cc of 0.2% ropivacaine, 1 cc of Decadron 10 mg/cc Imaging Guidance (Spinal):          Type of Imaging Technique: Fluoroscopy Guidance (Spinal) Indication(s): Assistance in needle guidance and placement for procedures requiring needle placement in or near specific anatomical locations not easily accessible without such assistance. Exposure Time: Please see nurses notes. Contrast: Before injecting any contrast, we confirmed that the patient did not have an allergy to iodine, shellfish, or radiological contrast. Once satisfactory needle placement was completed at the desired level, radiological contrast was injected. Contrast injected under live fluoroscopy. No contrast complications. See chart for type and volume of contrast used. Fluoroscopic Guidance: I was personally present during the use of fluoroscopy. "Tunnel Vision Technique" used to obtain the best possible view of the target area. Parallax error corrected before commencing the procedure. "Direction-depth-direction" technique used to introduce the needle under continuous pulsed fluoroscopy. Once target was reached, antero-posterior, oblique, and lateral fluoroscopic projection used confirm needle placement in all planes. Images permanently stored in EMR. Interpretation: I personally interpreted the imaging intraoperatively. Adequate needle  placement confirmed in multiple planes. Appropriate spread of contrast into desired area was observed. No evidence of afferent or efferent intravascular uptake. No intrathecal or subarachnoid spread observed. Permanent images saved into the patient's record.  Antibiotic Prophylaxis:   Anti-infectives (From admission, onward)   None     Indication(s): None identified  Post-operative Assessment:  Post-procedure Vital Signs:  Pulse/HCG Rate: 6161 Temp: 98.7 F (37.1 C) Resp: 16 BP: 107/71 SpO2: 94 %  EBL: None  Complications: No immediate post-treatment complications observed by team, or reported by patient.  Note: The patient tolerated the entire procedure well. A repeat set of vitals were taken after the procedure and the patient was kept under observation following institutional policy, for this type of procedure. Post-procedural neurological assessment was performed, showing return to baseline, prior to discharge. The patient was provided with post-procedure discharge instructions, including a section on how to identify potential problems. Should any problems arise concerning this procedure, the patient was given instructions to immediately contact us, at any time, without hesitation. In any case, we plan to contact the patient by telephone for a follow-up status report regarding this interventional procedure.  Comments:  No additional relevant information.  Plan of Care    Imaging Orders     DG C-Arm 1-60 Min-No Report Procedure Orders    No procedure(s) ordered today    Medications ordered for procedure: Meds ordered this encounter  Medications  . lactated ringers infusion 1,000 mL  . fentaNYL (SUBLIMAZE) injection 25-100 mcg    Make sure Narcan is available in the pyxis when using this medication. In the event of respiratory depression (RR< 8/min): Titrate NARCAN (naloxone) in increments of 0.1 to 0.2 mg IV at 2-3 minute intervals, until desired degree of reversal.  .  iopamidol (ISOVUE-M) 41 % intrathecal injection 10 mL  . ropivacaine (PF) 2 mg/mL (0.2%) (NAROPIN) injection 2 mL  . sodium chloride flush (NS) 0.9 % injection 2 mL  . lidocaine (XYLOCAINE) 2 % (with pres) injection 200 mg  . dexamethasone (DECADRON) injection 10 mg   Medications administered: We administered lactated ringers, fentaNYL, iopamidol, ropivacaine (PF) 2 mg/mL (0.2%),  sodium chloride flush, lidocaine, and dexamethasone.  See the medical record for exact dosing, route, and time of administration.  Disposition: Discharge home  Discharge Date & Time: 06/12/2018; 1040 hrs.   Future Appointments  Date Time Provider [place]  08/13/2018 10:45 AM Gillis Santa, MD [facility] None   Primary Care Physician: Guadlupe Spanish, MD [facility] Note by: Gillis Santa, MD Date: 06/12/2018; Time: 11:36 AM  Disclaimer:  Medicine is not an exact science. The only guarantee in medicine is that nothing is guaranteed. It is important to note that the decision to proceed with this intervention was based on the information collected from the patient. The Data and conclusions were drawn from the patient's questionnaire, the interview, and the physical examination. Because the information was provided in large part by the patient, it cannot be guaranteed that it has not been purposely or unconsciously manipulated. Every effort has been made to obtain as much relevant data as possible for this evaluation. It is important to note that the conclusions that lead to this procedure are derived in large part from the available data. Always take into account that the treatment will also be dependent on availability of resources and existing treatment guidelines, considered by other Pain Management Practitioners as being common knowledge and practice, at the time of the intervention. For Medico-Legal purposes, it is also important to point out that variation in  procedural techniques and pharmacological choices are the acceptable norm. The indications, contraindications, technique, and results of the above procedure should only be interpreted and judged by a Board-Certified Interventional Pain Specialist with extensive familiarity and expertise in the same exact procedure and technique.

## 2018-06-12 NOTE — Progress Notes (Signed)
Safety precautions to be maintained throughout the outpatient stay will include: orient to surroundings, keep bed in low position, maintain call bell within reach at all times, provide assistance with transfer out of bed and ambulation.  

## 2018-06-12 NOTE — Patient Instructions (Signed)

## 2018-06-13 ENCOUNTER — Telehealth: Payer: Self-pay

## 2018-06-13 NOTE — Telephone Encounter (Signed)
Post procedure phone call.  Patient states that she is having some of the same pressure feeling that she was having prior to the procedure. States that she felt good for the first several hours.  Informed patient that the numbing medicaine had wore off.  She denies fever, progressive weakness or anything different.  Informed her to notify us for any further questions or concerns or any increase in symptoms.

## 2018-08-05 ENCOUNTER — Telehealth: Payer: Self-pay | Admitting: Student in an Organized Health Care Education/Training Program

## 2018-08-05 MED ORDER — METHOCARBAMOL 750 MG PO TABS: 750.0000 mg | ORAL_TABLET | Freq: Three times a day (TID) | ORAL | 3 refills | Status: DC | PRN

## 2018-08-05 NOTE — Telephone Encounter (Signed)
Patient states she is almost out of methocarbamol. Her appt is not until 08-13-18. Can she get script called to pharmacy

## 2018-08-05 NOTE — Telephone Encounter (Signed)
Patient notified

## 2018-08-06 ENCOUNTER — Other Ambulatory Visit: Payer: Self-pay | Admitting: Student in an Organized Health Care Education/Training Program

## 2018-08-06 MED ORDER — TIZANIDINE HCL 2 MG PO TABS: 4.0000 mg | ORAL_TABLET | Freq: Two times a day (BID) | ORAL | 2 refills | Status: DC | PRN

## 2018-08-13 ENCOUNTER — Other Ambulatory Visit: Payer: Self-pay

## 2018-08-13 ENCOUNTER — Ambulatory Visit
Payer: Medicare Other | Attending: Nurse Practitioner | Admitting: Student in an Organized Health Care Education/Training Program

## 2018-08-13 ENCOUNTER — Encounter: Payer: Self-pay | Admitting: Student in an Organized Health Care Education/Training Program

## 2018-08-13 VITALS — BP 104/71 | HR 63 | Temp 98.8°F | Resp 18 | Ht 65.0 in | Wt 210.0 lb

## 2018-08-13 DIAGNOSIS — G894 Chronic pain syndrome: Secondary | ICD-10-CM | POA: Insufficient documentation

## 2018-08-13 DIAGNOSIS — M5136 Other intervertebral disc degeneration, lumbar region: Secondary | ICD-10-CM | POA: Insufficient documentation

## 2018-08-13 DIAGNOSIS — M961 Postlaminectomy syndrome, not elsewhere classified: Secondary | ICD-10-CM

## 2018-08-13 DIAGNOSIS — M5030 Other cervical disc degeneration, unspecified cervical region: Secondary | ICD-10-CM | POA: Diagnosis present

## 2018-08-13 DIAGNOSIS — M5416 Radiculopathy, lumbar region: Secondary | ICD-10-CM | POA: Diagnosis present

## 2018-08-13 DIAGNOSIS — M4302 Spondylolysis, cervical region: Secondary | ICD-10-CM | POA: Diagnosis not present

## 2018-08-13 DIAGNOSIS — M542 Cervicalgia: Secondary | ICD-10-CM | POA: Diagnosis present

## 2018-08-13 DIAGNOSIS — M47812 Spondylosis without myelopathy or radiculopathy, cervical region: Secondary | ICD-10-CM | POA: Diagnosis present

## 2018-08-13 MED ORDER — HYDROCODONE-ACETAMINOPHEN 7.5-325 MG PO TABS: 1.0000 | ORAL_TABLET | Freq: Three times a day (TID) | ORAL | 0 refills | Status: DC | PRN

## 2018-08-13 NOTE — Progress Notes (Signed)
Nursing Pain Medication Assessment:  Safety precautions to be maintained throughout the outpatient stay will include: orient to surroundings, keep bed in low position, maintain call bell within reach at all times, provide assistance with transfer out of bed and ambulation.  Medication Inspection Compliance: Pill count conducted under aseptic conditions, in front of the patient. Neither the pills nor the bottle was removed from the patient's sight at any time. Once count was completed pills were immediately returned to the patient in their original bottle.  Medication: Hydrocodone/APAP Pill/Patch Count: 63 of 90 pills remain Pill/Patch Appearance: Markings consistent with prescribed medication Bottle Appearance: Standard pharmacy container. Clearly labeled. Filled Date: 01 / 02 / 2020 Last Medication intake:  Yesterday

## 2018-08-13 NOTE — Patient Instructions (Addendum)
Sch for bilateral cervical facet block  GENERAL RISKS AND COMPLICATIONS  What are the risk, side effects and possible complications? Generally speaking, most procedures are safe.  However, with any procedure there are risks, side effects, and the possibility of complications.  The risks and complications are dependent upon the sites that are lesioned, or the type of nerve block to be performed.  The closer the procedure is to the spine, the more serious the risks are.  Great care is taken when placing the radio frequency needles, block needles or lesioning probes, but sometimes complications can occur. 1. Infection: Any time there is an injection through the skin, there is a risk of infection.  This is why sterile conditions are used for these blocks.  There are four possible types of infection. 1. Localized skin infection. 2. Central Nervous System Infection-This can be in the form of Meningitis, which can be deadly. 3. Epidural Infections-This can be in the form of an epidural abscess, which can cause pressure inside of the spine, causing compression of the spinal cord with subsequent paralysis. This would require an emergency surgery to decompress, and there are no guarantees that the patient would recover from the paralysis. 4. Discitis-This is an infection of the intervertebral discs.  It occurs in about 1% of discography procedures.  It is difficult to treat and it may lead to surgery.        2. Pain: the needles have to go through skin and soft tissues, will cause soreness.       3. Damage to internal structures:  The nerves to be lesioned may be near blood vessels or    other nerves which can be potentially damaged.       4. Bleeding: Bleeding is more common if the patient is taking blood thinners such as  aspirin, Coumadin, Ticiid, Plavix, etc., or if he/she have some genetic predisposition  such as hemophilia. Bleeding into the spinal canal can cause compression of the spinal  cord with  subsequent paralysis.  This would require an emergency surgery to  decompress and there are no guarantees that the patient would recover from the  paralysis.       5. Pneumothorax:  Puncturing of a lung is a possibility, every time a needle is introduced in  the area of the chest or upper back.  Pneumothorax refers to free air around the  collapsed lung(s), inside of the thoracic cavity (chest cavity).  Another two possible  complications related to a similar event would include: Hemothorax and Chylothorax.   These are variations of the Pneumothorax, where instead of air around the collapsed  lung(s), you may have blood or chyle, respectively.       6. Spinal headaches: They may occur with any procedures in the area of the spine.       7. Persistent CSF (Cerebro-Spinal Fluid) leakage: This is a rare problem, but may occur  with prolonged intrathecal or epidural catheters either due to the formation of a fistulous  track or a dural tear.       8. Nerve damage: By working so close to the spinal cord, there is always a possibility of  nerve damage, which could be as serious as a permanent spinal cord injury with  paralysis.       9. Death:  Although rare, severe deadly allergic reactions known as "Anaphylactic  reaction" can occur to any of the medications used.      10. Worsening of the symptoms:  We can always make thing worse.  What are the chances of something like this happening? Chances of any of this occuring are extremely low.  By statistics, you have more of a chance of getting killed in a motor vehicle accident: while driving to the hospital than any of the above occurring .  Nevertheless, you should be aware that they are possibilities.  In general, it is similar to taking a shower.  Everybody knows that you can slip, hit your head and get killed.  Does that mean that you should not shower again?  Nevertheless always keep in mind that statistics do not mean anything if you happen to be on the wrong  side of them.  Even if a procedure has a 1 (one) in a 1,000,000 (million) chance of going wrong, it you happen to be that one..Also, keep in mind that by statistics, you have more of a chance of having something go wrong when taking medications.  Who should not have this procedure? If you are on a blood thinning medication (e.g. Coumadin, Plavix, see list of "Blood Thinners"), or if you have an active infection going on, you should not have the procedure.  If you are taking any blood thinners, please inform your physician.  How should I prepare for this procedure?  Do not eat or drink anything at least six hours prior to the procedure.  Bring a driver with you .  It cannot be a taxi.  Come accompanied by an adult that can drive you back, and that is strong enough to help you if your legs get weak or numb from the local anesthetic.  Take all of your medicines the morning of the procedure with just enough water to swallow them.  If you have diabetes, make sure that you are scheduled to have your procedure done first thing in the morning, whenever possible.  If you have diabetes, take only half of your insulin dose and notify our nurse that you have done so as soon as you arrive at the clinic.  If you are diabetic, but only take blood sugar pills (oral hypoglycemic), then do not take them on the morning of your procedure.  You may take them after you have had the procedure.  Do not take aspirin or any aspirin-containing medications, at least eleven (11) days prior to the procedure.  They may prolong bleeding.  Wear loose fitting clothing that may be easy to take off and that you would not mind if it got stained with Betadine or blood.  Do not wear any jewelry or perfume  Remove any nail coloring.  It will interfere with some of our monitoring equipment.  NOTE: Remember that this is not meant to be interpreted as a complete list of all possible complications.  Unforeseen problems may  occur.  BLOOD THINNERS The following drugs contain aspirin or other products, which can cause increased bleeding during surgery and should not be taken for 2 weeks prior to and 1 week after surgery.  If you should need take something for relief of minor pain, you may take acetaminophen which is found in Tylenol,m Datril, Anacin-3 and Panadol. It is not blood thinner. The products listed below are.  Do not take any of the products listed below in addition to any listed on your instruction sheet.  A.P.C or A.P.C with Codeine Codeine Phosphate Capsules #3 Ibuprofen Ridaura  ABC compound Congesprin Imuran rimadil  Advil Cope Indocin Robaxisal  Alka-Seltzer Effervescent Pain Reliever and Antacid  Coricidin or Coricidin-D  Indomethacin Rufen  Alka-Seltzer plus Cold Medicine Cosprin Ketoprofen S-A-C Tablets  Anacin Analgesic Tablets or Capsules Coumadin Korlgesic Salflex  Anacin Extra Strength Analgesic tablets or capsules CP-2 Tablets Lanoril Salicylate  Anaprox Cuprimine Capsules Levenox Salocol  Anexsia-D Dalteparin Magan Salsalate  Anodynos Darvon compound Magnesium Salicylate Sine-off  Ansaid Dasin Capsules Magsal Sodium Salicylate  Anturane Depen Capsules Marnal Soma  APF Arthritis pain formula Dewitt's Pills Measurin Stanback  Argesic Dia-Gesic Meclofenamic Sulfinpyrazone  Arthritis Bayer Timed Release Aspirin Diclofenac Meclomen Sulindac  Arthritis pain formula Anacin Dicumarol Medipren Supac  Analgesic (Safety coated) Arthralgen Diffunasal Mefanamic Suprofen  Arthritis Strength Bufferin Dihydrocodeine Mepro Compound Suprol  Arthropan liquid Dopirydamole Methcarbomol with Aspirin Synalgos  ASA tablets/Enseals Disalcid Micrainin Tagament  Ascriptin Doan's Midol Talwin  Ascriptin A/D Dolene Mobidin Tanderil  Ascriptin Extra Strength Dolobid Moblgesic Ticlid  Ascriptin with Codeine Doloprin or Doloprin with Codeine Momentum Tolectin  Asperbuf Duoprin Mono-gesic Trendar  Aspergum Duradyne  Motrin or Motrin IB Triminicin  Aspirin plain, buffered or enteric coated Durasal Myochrisine Trigesic  Aspirin Suppositories Easprin Nalfon Trillsate  Aspirin with Codeine Ecotrin Regular or Extra Strength Naprosyn Uracel  Atromid-S Efficin Naproxen Ursinus  Auranofin Capsules Elmiron Neocylate Vanquish  Axotal Emagrin Norgesic Verin  Azathioprine Empirin or Empirin with Codeine Normiflo Vitamin E  Azolid Emprazil Nuprin Voltaren  Bayer Aspirin plain, buffered or children's or timed BC Tablets or powders Encaprin Orgaran Warfarin Sodium  Buff-a-Comp Enoxaparin Orudis Zorpin  Buff-a-Comp with Codeine Equegesic Os-Cal-Gesic   Buffaprin Excedrin plain, buffered or Extra Strength Oxalid   Bufferin Arthritis Strength Feldene Oxphenbutazone   Bufferin plain or Extra Strength Feldene Capsules Oxycodone with Aspirin   Bufferin with Codeine Fenoprofen Fenoprofen Pabalate or Pabalate-SF   Buffets II Flogesic Panagesic   Buffinol plain or Extra Strength Florinal or Florinal with Codeine Panwarfarin   Buf-Tabs Flurbiprofen Penicillamine   Butalbital Compound Four-way cold tablets Penicillin   Butazolidin Fragmin Pepto-Bismol   Carbenicillin Geminisyn Percodan   Carna Arthritis Reliever Geopen Persantine   Carprofen Gold's salt Persistin   Chloramphenicol Goody's Phenylbutazone   Chloromycetin Haltrain Piroxlcam   Clmetidine heparin Plaquenil   Cllnoril Hyco-pap Ponstel   Clofibrate Hydroxy chloroquine Propoxyphen         Before stopping any of these medications, be sure to consult the physician who ordered them.  Some, such as Coumadin (Warfarin) are ordered to prevent or treat serious conditions such as "deep thrombosis", "pumonary embolisms", and other heart problems.  The amount of time that you may need off of the medication may also vary with the medication and the reason for which you were taking it.  If you are taking any of these medications, please make sure you notify your pain  physician before you undergo any procedures.   Moderate Conscious Sedation, Adult Sedation is the use of medicines to promote relaxation and relieve discomfort and anxiety. Moderate conscious sedation is a type of sedation. Under moderate conscious sedation, you are less alert than normal, but you are still able to respond to instructions, touch, or both. Moderate conscious sedation is used during short medical and dental procedures. It is milder than deep sedation, which is a type of sedation under which you cannot be easily woken up. It is also milder than general anesthesia, which is the use of medicines to make you unconscious. Moderate conscious sedation allows you to return to your regular activities sooner. Tell a health care provider about:  Any allergies you have.  All medicines you  are taking, including vitamins, herbs, eye drops, creams, and over-the-counter medicines.  Use of steroids (by mouth or creams).  Any problems you or family members have had with sedatives and anesthetic medicines.  Any blood disorders you have.  Any surgeries you have had.  Any medical conditions you have, such as sleep apnea.  Whether you are pregnant or may be pregnant.  Any use of cigarettes, alcohol, marijuana, or street drugs. What are the risks? Generally, this is a safe procedure. However, problems may occur, including:  Getting too much medicine (oversedation).  Nausea.  Allergic reaction to medicines.  Trouble breathing. If this happens, a breathing tube may be used to help with breathing. It will be removed when you are awake and breathing on your own.  Heart trouble.  Lung trouble. What happens before the procedure? Staying hydrated Follow instructions from your health care provider about hydration, which may include:  Up to 2 hours before the procedure - you may continue to drink clear liquids, such as water, clear fruit juice, black coffee, and plain tea. Eating and  drinking restrictions Follow instructions from your health care provider about eating and drinking, which may include:  8 hours before the procedure - stop eating heavy meals or foods such as meat, fried foods, or fatty foods.  6 hours before the procedure - stop eating light meals or foods, such as toast or cereal.  6 hours before the procedure - stop drinking milk or drinks that contain milk.  2 hours before the procedure - stop drinking clear liquids. Medicine Ask your health care provider about:  Changing or stopping your regular medicines. This is especially important if you are taking diabetes medicines or blood thinners.  Taking medicines such as aspirin and ibuprofen. These medicines can thin your blood. Do not take these medicines before your procedure if your health care provider instructs you not to.  Tests and exams  You will have a physical exam.  You may have blood tests done to show: ? How well your kidneys and liver are working. ? How well your blood can clot. General instructions  Plan to have someone take you home from the hospital or clinic.  If you will be going home right after the procedure, plan to have someone with you for 24 hours. What happens during the procedure?  An IV tube will be inserted into one of your veins.  Medicine to help you relax (sedative) will be given through the IV tube.  The medical or dental procedure will be performed. What happens after the procedure?  Your blood pressure, heart rate, breathing rate, and blood oxygen level will be monitored often until the medicines you were given have worn off.  Do not drive for 24 hours. This information is not intended to replace advice given to you by your health care provider. Make sure you discuss any questions you have with your health care provider. Document Released: 04/04/2001 Document Revised: 12/14/2015 Document Reviewed: 10/30/2015 Elsevier Interactive Patient Education  2019  [place] Facet Blocks Patient Information  Description: The facets are joints in the spine between the vertebrae.  Like any joints in the body, facets can become irritated and painful.  Arthritis can also effect the facets.  By injecting steroids and local anesthetic in and around these joints, we can temporarily block the nerve supply to them.  Steroids act directly on irritated nerves and tissues to reduce selling and inflammation which often leads to decreased pain.  Facet blocks  may be done anywhere along the spine from the neck to the low back depending upon the location of your pain.   After numbing the skin with local anesthetic (like Novocaine), a small needle is passed onto the facet joints under x-ray guidance.  You may experience a sensation of pressure while this is being done.  The entire block usually lasts about 15-25 minutes.   Conditions which may be treated by facet blocks:   Low back/buttock pain  Neck/shoulder pain  Certain types of headaches  Preparation for the injection:  1. Do not eat any solid food or dairy products within 8 hours of your appointment. 2. You may drink clear liquid up to 3 hours before appointment.  Clear liquids include water, black coffee, juice or soda.  No milk or cream please. 3. You may take your regular medication, including pain medications, with a sip of water before your appointment.  Diabetics should hold regular insulin (if taken separately) and take 1/2 normal NPH dose the morning of the procedure.  Carry some sugar containing items with you to your appointment. 4. A driver must accompany you and be prepared to drive you home after your procedure. 5. Bring all your current medications with you. 6. An IV may be inserted and sedation may be given at the discretion of the physician. 7. A blood pressure cuff, EKG and other monitors will often be applied during the procedure.  Some patients may need to have extra oxygen administered for a  short period. 8. You will be asked to provide medical information, including your allergies and medications, prior to the procedure.  We must know immediately if you are taking blood thinners (like Coumadin/Warfarin) or if you are allergic to IV iodine contrast (dye).  We must know if you could possible be pregnant.  Possible side-effects:   Bleeding from needle site  Infection (rare, may require surgery)  Nerve injury (rare)  Numbness & tingling (temporary)  Difficulty urinating (rare, temporary)  Spinal headache (a headache worse with upright posture)  Light-headedness (temporary)  Pain at injection site (serveral days)  Decreased blood pressure (rare, temporary)  Weakness in arm/leg (temporary)  Pressure sensation in back/neck (temporary)   Call if you experience:   Fever/chills associated with headache or increased back/neck pain  Headache worsened by an upright position  New onset, weakness or numbness of an extremity below the injection site  Hives or difficulty breathing (go to the emergency room)  Inflammation or drainage at the injection site(s)  Severe back/neck pain greater than usual  New symptoms which are concerning to you  Please note:  Although the local anesthetic injected can often make your back or neck feel good for several hours after the injection, the pain will likely return. It takes 3-7 days for steroids to work.  You may not notice any pain relief for at least one week.  If effective, we will often do a series of 2-3 injections spaced 3-6 weeks apart to maximally decrease your pain.  After the initial series, you may be a candidate for a more permanent nerve block of the facets.  If you have any questions, please call #[phone]) [facility]

## 2018-08-13 NOTE — Progress Notes (Signed)
Patient's Name: Tammy Lamb  MRN: [id number]  Referring Provider: Guadlupe Spanish, MD  DOB: 03-16-1951  PCP: Tammy Spanish, MD  DOS: 08/13/2018  Note by: Gillis Santa, MD  Service setting: Ambulatory outpatient  Specialty: Interventional Pain Management  Location: [facility] (AMB) Pain Management Facility    Patient type: Established   Primary Reason(s) for Visit: Encounter for post-procedure evaluation of chronic illness with mild to moderate exacerbation CC: Back Pain; Neck Pain; and Shoulder Pain (bilateral)  HPI  [name] is a 68 y.o. year old, female patient, who comes today for a post-procedure evaluation. 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 Her primarily concern today is the Back Pain; Neck Pain; and Shoulder Pain (bilateral)  Pain Assessment: Location: Lower, Mid, Upper Back Radiating: to hips bilaterally during sleep, sometimes down left leg to middle toe Onset: More than a month ago Duration: Chronic pain Quality: Spasm, Constant Severity: 8 /10 (subjective, self-reported pain score)  Note: Reported level is inconsistent with clinical observations. Clinically the patient looks like a 3/10 A 3/10 is viewed as "Moderate" and described as significantly interfering with activities of daily living (ADL). It becomes difficult to feed, bathe, get dressed, get on and off the toilet or to perform personal hygiene functions. Difficult to get in and out of bed or a chair without assistance. Very distracting. With effort, it can be ignored when deeply involved in activities. Information on the proper use of the pain scale provided to the patient today. When using our objective Pain Scale,  levels between 6 and 10/10 are said to belong in an emergency room, as it progressively worsens from a 6/10, described as severely limiting, requiring emergency care not usually available at an outpatient pain management facility. At a 6/10 level, communication becomes difficult and requires great effort. Assistance to reach the emergency department may be required. Facial flushing and profuse sweating along with potentially dangerous increases in heart rate and blood pressure will be evident. Effect on ADL: limits daily activity, difficult to sleep Timing: Constant Modifying factors: meds BP: 104/71  HR: 63  [name] comes in today for post-procedure evaluation.  Further details on both, my assessment(s), as well as the proposed treatment plan, please see below.  Post-Procedure Assessment  08/05/2018 Procedure: Caudal ESI #1 Pre-procedure pain score:        /10 Post-procedure pain score: 0/10         Influential Factors: BMI: 34.95 kg/m Intra-procedural challenges: None observed.         Assessment challenges: None detected.              Reported side-effects: None.        Post-procedural adverse reactions or complications: None reported         Sedation: Please see nurses note. When no sedatives are used, the analgesic levels obtained are directly associated to the effectiveness of the local anesthetics. However, when sedation is provided, the level of analgesia obtained during the initial 1 hour following the intervention, is believed to be the result of a combination of factors. These factors may include, but are not limited to: 1. The effectiveness of the local anesthetics used. 2. The effects of the analgesic(s) and/or anxiolytic(s) used. 3. The degree of discomfort experienced by the patient at the  time of the procedure. 4. The patients ability and reliability in recalling and recording the events. 5. The presence and influence of possible secondary gains and/or psychosocial  factors. Reported result: Relief experienced during the 1st hour after the procedure: 100 % (Ultra-Short Term Relief)            Interpretative annotation: Clinically appropriate result. Analgesia during this period is likely to be Local Anesthetic and/or IV Sedative (Analgesic/Anxiolytic) related.          Effects of local anesthetic: The analgesic effects attained during this period are directly associated to the localized infiltration of local anesthetics and therefore cary significant diagnostic value as to the etiological location, or anatomical origin, of the pain. Expected duration of relief is directly dependent on the pharmacodynamics of the local anesthetic used. Long-acting (4-6 hours) anesthetics used.  Reported result: Relief during the next 4 to 6 hour after the procedure: 100 % (Short-Term Relief)            Interpretative annotation: Clinically appropriate result. Analgesia during this period is likely to be Local Anesthetic-related.          Long-term benefit: Defined as the period of time past the expected duration of local anesthetics (1 hour for short-acting and 4-6 hours for long-acting). With the possible exception of prolonged sympathetic blockade from the local anesthetics, benefits during this period are typically attributed to, or associated with, other factors such as analgesic sensory neuropraxia, antiinflammatory effects, or beneficial biochemical changes provided by agents other than the local anesthetics.  Reported result: Extended relief following procedure: 100 %(pain returned after first day) (Long-Term Relief)            Interpretative annotation: Clinically possible results. No long-term benefit. Incomplete therapeutic success. Pain appears to be refractory to this treatment modality.          Current benefits: Defined as reported results that persistent at this point in time.   Analgesia: <25 %            Function: No benefit ROM: No benefit Interpretative  annotation: Recurrence of symptoms. Therapeutic failure. Results would suggest persistent aggravating factors.          Interpretation: Results would suggest a successful diagnostic intervention.                  Plan:  Please see "Plan of Care" for details.                Laboratory Chemistry  Inflammation Markers (CRP: Acute Phase) (ESR: Chronic Phase) No results found for: CRP, ESRSEDRATE, LATICACIDVEN                       Rheumatology Markers No results found for: RF, ANA, LABURIC, URICUR, LYMEIGGIGMAB, LYMEABIGMQN, HLAB27                      Renal Function Markers Lab Results  Component Value Date   BUN 13 05/28/2017   CREATININE 0.78 05/28/2017   GFRAA >60 05/28/2017   GFRNONAA >60 05/28/2017                             Hepatic Function Markers No results found for: AST, ALT, ALBUMIN, ALKPHOS, HCVAB, AMYLASE, LIPASE, AMMONIA                      Electrolytes Lab Results  Component Value Date  NA 134 (L) 05/28/2017   K 3.8 05/28/2017   CL 96 (L) 05/28/2017   CALCIUM 9.3 05/28/2017                        Neuropathy Markers No results found for: VITAMINB12, FOLATE, HGBA1C, HIV                      CNS Tests No results found for: COLORCSF, APPEARCSF, RBCCOUNTCSF, WBCCSF, POLYSCSF, LYMPHSCSF, EOSCSF, PROTEINCSF, GLUCCSF, JCVIRUS, CSFOLI, IGGCSF                      Bone Pathology Markers No results found for: VD25OH, [id number], [id number], QZ3007MA2, 25OHVITD1, 25OHVITD2, 25OHVITD3, TESTOFREE, TESTOSTERONE                       Coagulation Parameters Lab Results  Component Value Date   INR 1.06 05/28/2017   LABPROT 13.7 05/28/2017   APTT 31 05/28/2017   PLT 394 05/28/2017                        Cardiovascular Markers Lab Results  Component Value Date   HGB 12.4 05/28/2017   HCT 37.1 05/28/2017                         CA Markers No results found for: CEA, CA125, LABCA2                      Note: Lab results reviewed.  Recent Diagnostic Imaging  Results  DG C-Arm 1-60 Min-No Report Fluoroscopy was utilized by the requesting physician.  No radiographic  interpretation.   Complexity Note: Imaging results reviewed. Results shared with Ms. Dona, using Layman's terms.                         Meds   Current Outpatient Medications:  .  acetaminophen (TYLENOL) 500 MG tablet, Take 1,000 mg by mouth at bedtime as needed (pain)., Disp: , Rfl:  .  atorvastatin (LIPITOR) 80 MG tablet, Take 80 mg by mouth daily at 6 PM., Disp: , Rfl:  .  carvedilol (COREG) 25 MG tablet, Take 12.5 mg by mouth 2 (two) times daily., Disp: , Rfl:  .  Cholecalciferol (VITAMIN D3) 2000 units capsule, Take 2,000 Units by mouth at bedtime., Disp: , Rfl:  .  CINNAMON PO, Take 1,000 mg by mouth at bedtime., Disp: , Rfl:  .  citalopram (CELEXA) 20 MG tablet, Take 20 mg by mouth every evening., Disp: , Rfl:  .  gabapentin (NEURONTIN) 300 MG capsule, 300 mg day, 600 mg qhs, Disp: 90 capsule, Rfl: 2 .  hydrochlorothiazide (HYDRODIURIL) 25 MG tablet, Take 25 mg by mouth daily., Disp: , Rfl:  .  Melatonin 10 MG TABS, Take 10 mg by mouth at bedtime as needed (sleep)., Disp: , Rfl:  .  Omeprazole-Sodium Bicarbonate (ZEGERID OTC) 20-1100 MG CAPS capsule, Take 1 capsule by mouth 2 (two) times daily., Disp: , Rfl:  .  phenylephrine (NEO-SYNEPHRINE) 1 % nasal spray, Place 1 drop into both nostrils daily as needed for congestion., Disp: , Rfl:  .  ramipril (ALTACE) 10 MG capsule, Take 10 mg by mouth daily., Disp: , Rfl:  .  sulfaSALAzine (AZULFIDINE) 500 MG tablet, Take 500 mg by mouth 2 (two) times daily., Disp: , Rfl:  .  tiZANidine (ZANAFLEX) 2 MG tablet, Take 2 tablets (4 mg total) by mouth 2 (two) times daily as needed for muscle spasms., Disp: 120 tablet, Rfl: 2 .  aspirin (ECOTRIN LOW STRENGTH) 81 MG EC tablet, Take 81 mg by mouth daily. Swallow whole., Disp: , Rfl:   ROS  Constitutional: Denies any fever or chills Gastrointestinal: No reported hemesis, hematochezia,  vomiting, or acute GI distress Musculoskeletal: Denies any acute onset joint swelling, redness, loss of ROM, or weakness Neurological: No reported episodes of acute onset apraxia, aphasia, dysarthria, agnosia, amnesia, paralysis, loss of coordination, or loss of consciousness  Allergies  Ms. Diskin is allergic to sulfa antibiotics.  PFSH  Drug: Ms. Gindlesperger  reports no history of drug use. Alcohol:  reports current alcohol use. Tobacco:  reports that she has been smoking. She has a 49.00 pack-year smoking history. She has never used smokeless tobacco. Medical:  has a past medical history of Anxiety, Arthritis, Back pain, Colitis, ulcerative ([place]), COPD (chronic obstructive pulmonary disease) ([place]), Depression, GERD (gastroesophageal reflux disease), Headache, Hypertension, Motion sickness, Stroke ([place]) (2015), TIA (transient ischemic attack) (2015), Urinary frequency, and Wears dentures. Surgical: Ms. Tino  has a past surgical history that includes Tonsillectomy; Cholecystectomy; Abdominal hysterectomy; Diagnostic laparoscopy; Bladder suspension; Colonoscopy; Upper gi endoscopy; Knee arthroscopy (Left, 02/13/2014); Ankle arthroscopy (Left, 03/22/2016); Tendon repair (Left, 03/22/2016); and Lumbar laminectomy/decompression microdiscectomy (N/A, 05/30/2017). Family: family history includes COPD in her father; Diabetes in her mother; Heart disease in her father; Hypertension in her father and mother.  Constitutional Exam  General appearance: Well nourished, well developed, and well hydrated. In no apparent acute distress Vitals:   08/13/18 1057  BP: 104/71  Pulse: 63  Resp: 18  Temp: 98.8 F (37.1 C)  TempSrc: Oral  SpO2: 95%  Weight: 210 lb (95.3 kg)  Height: 5' 5"  (1.651 m)   BMI Assessment: Estimated body mass index is 34.95 kg/m as calculated from the following:   Height as of this encounter: 5' 5"  (1.651 m).   Weight as of this encounter: 210 lb (95.3 kg).  BMI interpretation table: BMI  level Category Range association with higher incidence of chronic pain  <18 kg/m2 Underweight   18.5-24.9 kg/m2 Ideal body weight   25-29.9 kg/m2 Overweight Increased incidence by 20%  30-34.9 kg/m2 Obese (Class I) Increased incidence by 68%  35-39.9 kg/m2 Severe obesity (Class II) Increased incidence by 136%  >40 kg/m2 Extreme obesity (Class III) Increased incidence by 254%   Patient's current BMI Ideal Body weight  Body mass index is 34.95 kg/m. Ideal body weight: 57 kg (125 lb 10.6 oz) Adjusted ideal body weight: 72.3 kg (159 lb 6.4 oz)   BMI Readings from Last 4 Encounters:  08/13/18 34.95 kg/m  06/12/18 35.53 kg/m  05/28/18 33.41 kg/m  04/10/18 34.86 kg/m   Wt Readings from Last 4 Encounters:  08/13/18 210 lb (95.3 kg)  06/12/18 207 lb (93.9 kg)  05/28/18 207 lb (93.9 kg)  04/10/18 216 lb (98 kg)  Psych/Mental status: Alert, oriented x 3 (person, place, & time)       Eyes: PERLA Respiratory: No evidence of acute respiratory distress  Cervical Spine Area Exam  Skin & Axial Inspection: No masses, redness, edema, swelling, or associated skin lesions Alignment: Symmetrical Functional ROM: Decreased ROM      Stability: No instability detected Muscle Tone/Strength: Functionally intact. No obvious neuro-muscular anomalies detected. Sensory (Neurological): Articular pain pattern Palpation: Complains of area being tender to palpation Positive provocative maneuver for for cervical facet  disease  Upper Extremity (UE) Exam    Side: Right upper extremity  Side: Left upper extremity  Skin & Extremity Inspection: Skin color, temperature, and hair growth are WNL. No peripheral edema or cyanosis. No masses, redness, swelling, asymmetry, or associated skin lesions. No contractures.  Skin & Extremity Inspection: Skin color, temperature, and hair growth are WNL. No peripheral edema or cyanosis. No masses, redness, swelling, asymmetry, or associated skin lesions. No contractures.    Functional ROM: Unrestricted ROM          Functional ROM: Unrestricted ROM          Muscle Tone/Strength: Functionally intact. No obvious neuro-muscular anomalies detected.  Muscle Tone/Strength: Functionally intact. No obvious neuro-muscular anomalies detected.  Sensory (Neurological): Unimpaired          Sensory (Neurological): Unimpaired          Palpation: No palpable anomalies              Palpation: No palpable anomalies              Provocative Test(s):  Phalen's test: deferred Tinel's test: deferred Apley's scratch test (touch opposite shoulder):  Action 1 (Across chest): deferred Action 2 (Overhead): deferred Action 3 (LB reach): deferred   Provocative Test(s):  Phalen's test: deferred Tinel's test: deferred Apley's scratch test (touch opposite shoulder):  Action 1 (Across chest): deferred Action 2 (Overhead): deferred Action 3 (LB reach): deferred    Thoracic Spine Area Exam  Skin & Axial Inspection: No masses, redness, or swelling Alignment: Symmetrical Functional ROM: Unrestricted ROM Stability: No instability detected Muscle Tone/Strength: Functionally intact. No obvious neuro-muscular anomalies detected. Sensory (Neurological): Unimpaired Muscle strength & Tone: No palpable anomalies  Lumbar Spine Area Exam  Skin & Axial Inspection: Well healed scar from previous spine surgery detected Alignment: Symmetrical Functional ROM: Decreased ROM affecting both sides Stability: No instability detected Muscle Tone/Strength: Functionally intact. No obvious neuro-muscular anomalies detected. Sensory (Neurological): Dermatomal pain pattern and musculoskeletal Palpation: No palpable anomalies       Provocative Tests: Hyperextension/rotation test: (+) due to pain. Lumbar quadrant test (Kemp's test): (+) due to pain. Lateral bending test: (+) due to pain. Patrick's Maneuver: deferred today                   FABER* test: deferred today                   S-I anterior  distraction/compression test: deferred today         S-I lateral compression test: deferred today         S-I Thigh-thrust test: deferred today         S-I Gaenslen's test: deferred today         *(Flexion, ABduction and External Rotation)  Gait & Posture Assessment  Ambulation: Limited Gait: Antalgic Posture: Difficulty standing up straight, due to pain   Lower Extremity Exam    Side: Right lower extremity  Side: Left lower extremity  Stability: No instability observed          Stability: No instability observed          Skin & Extremity Inspection: Skin color, temperature, and hair growth are WNL. No peripheral edema or cyanosis. No masses, redness, swelling, asymmetry, or associated skin lesions. No contractures.  Skin & Extremity Inspection: Skin color, temperature, and hair growth are WNL. No peripheral edema or cyanosis. No masses, redness, swelling, asymmetry, or associated skin lesions. No contractures.  Functional ROM: Decreased ROM  for hip and knee joints          Functional ROM: Decreased ROM for hip and knee joints          Muscle Tone/Strength: Functionally intact. No obvious neuro-muscular anomalies detected.  Muscle Tone/Strength: Functionally intact. No obvious neuro-muscular anomalies detected.  Sensory (Neurological): Dermatomal pain pattern        Sensory (Neurological): Dermatomal pain pattern        DTR: Patellar: deferred today Achilles: deferred today Plantar: deferred today  DTR: Patellar: deferred today Achilles: deferred today Plantar: deferred today  Palpation: No palpable anomalies  Palpation: No palpable anomalies   Assessment  Primary Diagnosis & Pertinent Problem List: The primary encounter diagnosis was Cervical facet joint syndrome. 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  Status Diagnosis  Having a Flare-up Having a Flare-up Having a Flare-up 1. Cervical facet joint syndrome   2. DDD (degenerative disc disease), cervical   3. Cervical spondylolysis   4. Cervicalgia   5. 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 Lumbar degenerative disc disease   7. Lumbar radiculopathy   8. Chronic pain syndrome      Patient follows up today for postprocedural evaluation status post caudal ESI.  Patient does not endorse any significant benefit after the caudal ESI.  She states that for 1 to 2 days after the injection she had significantly reduced pain but the pain quickly returned thereafter.  Today the patient is complaining more of neck pain that radiates to bilateral shoulders.  She states that the pain does not radiate down to her hands.  She describes achy, throbbing sensation in her cervical spine that radiates to bilateral shoulders.  Alivea D Beadles has a history of greater than 3 months of moderate to severe pain which is resulted in functional impairment.  The patient has tried various conservative therapeutic options such as NSAIDs, Tylenol, muscle relaxants, physical therapy which was inadequately effective.  Patient's pain is predominantly axial with physical exam findings suggestive of facet arthropathy.  Cervical facet medial branch nerve blocks were discussed with the patient.  Risks and benefits were reviewed.  Patient would like to proceed with bilateral C4, C5, C6, C7 medial branch nerve block.  Patient still has one prescription that she can pick up from the pharmacy.  Can do medication refill at next visit when we performed bilateral cervical facet medial branch nerve blocks.   Plan of Care  Lab-work, procedure(s), and/or referral(s): Orders Placed This Encounter  Procedures  . CERVICAL FACET (MEDIAL BRANCH NERVE BLOCK)    Provider-requested follow-up: Return in about 2 weeks (around  08/27/2018) for Procedure.  Time Note: Greater than 50% of the 25 minute(s) of face-to-face time spent with Ms. Papa, was spent in counseling/coordination of care regarding: Ms. Delano primary cause of pain, the results of her recent test(s), the treatment plan, treatment alternatives, the risks and possible complications of proposed treatment, going over the informed consent, the results, interpretation and significance of  her recent diagnostic interventional treatment(s) and realistic expectations.  Future Appointments  Date Time Provider [place]  08/26/2018  9:45 AM Gillis Santa, MD [facility] None    Primary Care Physician: Tammy Spanish, MD Location: [facility] Note by: Gillis Santa, M.D Date: 08/13/2018; Time: 3:41 PM  Patient Instructions   Sch  for bilateral cervical facet block  GENERAL RISKS AND COMPLICATIONS  What are the risk, side effects and possible complications? Generally speaking, most procedures are safe.  However, with any procedure there are risks, side effects, and the possibility of complications.  The risks and complications are dependent upon the sites that are lesioned, or the type of nerve block to be performed.  The closer the procedure is to the spine, the more serious the risks are.  Great care is taken when placing the radio frequency needles, block needles or lesioning probes, but sometimes complications can occur. 1. Infection: Any time there is an injection through the skin, there is a risk of infection.  This is why sterile conditions are used for these blocks.  There are four possible types of infection. 1. Localized skin infection. 2. Central Nervous System Infection-This can be in the form of Meningitis, which can be deadly. 3. Epidural Infections-This can be in the form of an epidural abscess, which can cause pressure inside of the spine, causing compression of the spinal cord with subsequent paralysis. This  would require an emergency surgery to decompress, and there are no guarantees that the patient would recover from the paralysis. 4. Discitis-This is an infection of the intervertebral discs.  It occurs in about 1% of discography procedures.  It is difficult to treat and it may lead to surgery.        2. Pain: the needles have to go through skin and soft tissues, will cause soreness.       3. Damage to internal structures:  The nerves to be lesioned may be near blood vessels or    other nerves which can be potentially damaged.       4. Bleeding: Bleeding is more common if the patient is taking blood thinners such as  aspirin, Coumadin, Ticiid, Plavix, etc., or if he/she have some genetic predisposition  such as hemophilia. Bleeding into the spinal canal can cause compression of the spinal  cord with subsequent paralysis.  This would require an emergency surgery to  decompress and there are no guarantees that the patient would recover from the  paralysis.       5. Pneumothorax:  Puncturing of a lung is a possibility, every time a needle is introduced in  the area of the chest or upper back.  Pneumothorax refers to free air around the  collapsed lung(s), inside of the thoracic cavity (chest cavity).  Another two possible  complications related to a similar event would include: Hemothorax and Chylothorax.   These are variations of the Pneumothorax, where instead of air around the collapsed  lung(s), you may have blood or chyle, respectively.       6. Spinal headaches: They may occur with any procedures in the area of the spine.       7. Persistent CSF (Cerebro-Spinal Fluid) leakage: This is a rare problem, but may occur  with prolonged intrathecal or epidural catheters either due to the formation of a fistulous  track or a dural tear.       8. Nerve damage: By working so close to the spinal cord, there is always a possibility of  nerve damage, which could be as serious as a permanent spinal cord injury with    paralysis.       9. Death:  Although rare, severe deadly allergic reactions known as "Anaphylactic  reaction" can occur to any of the medications used.      10. Worsening of the  symptoms:  We can always make thing worse.  What are the chances of something like this happening? Chances of any of this occuring are extremely low.  By statistics, you have more of a chance of getting killed in a motor vehicle accident: while driving to the hospital than any of the above occurring .  Nevertheless, you should be aware that they are possibilities.  In general, it is similar to taking a shower.  Everybody knows that you can slip, hit your head and get killed.  Does that mean that you should not shower again?  Nevertheless always keep in mind that statistics do not mean anything if you happen to be on the wrong side of them.  Even if a procedure has a 1 (one) in a 1,000,000 (million) chance of going wrong, it you happen to be that one..Also, keep in mind that by statistics, you have more of a chance of having something go wrong when taking medications.  Who should not have this procedure? If you are on a blood thinning medication (e.g. Coumadin, Plavix, see list of "Blood Thinners"), or if you have an active infection going on, you should not have the procedure.  If you are taking any blood thinners, please inform your physician.  How should I prepare for this procedure?  Do not eat or drink anything at least six hours prior to the procedure.  Bring a driver with you .  It cannot be a taxi.  Come accompanied by an adult that can drive you back, and that is strong enough to help you if your legs get weak or numb from the local anesthetic.  Take all of your medicines the morning of the procedure with just enough water to swallow them.  If you have diabetes, make sure that you are scheduled to have your procedure done first thing in the morning, whenever possible.  If you have diabetes, take only half of  your insulin dose and notify our nurse that you have done so as soon as you arrive at the clinic.  If you are diabetic, but only take blood sugar pills (oral hypoglycemic), then do not take them on the morning of your procedure.  You may take them after you have had the procedure.  Do not take aspirin or any aspirin-containing medications, at least eleven (11) days prior to the procedure.  They may prolong bleeding.  Wear loose fitting clothing that may be easy to take off and that you would not mind if it got stained with Betadine or blood.  Do not wear any jewelry or perfume  Remove any nail coloring.  It will interfere with some of our monitoring equipment.  NOTE: Remember that this is not meant to be interpreted as a complete list of all possible complications.  Unforeseen problems may occur.  BLOOD THINNERS The following drugs contain aspirin or other products, which can cause increased bleeding during surgery and should not be taken for 2 weeks prior to and 1 week after surgery.  If you should need take something for relief of minor pain, you may take acetaminophen which is found in Tylenol,m Datril, Anacin-3 and Panadol. It is not blood thinner. The products listed below are.  Do not take any of the products listed below in addition to any listed on your instruction sheet.  A.P.C or A.P.C with Codeine Codeine Phosphate Capsules #3 Ibuprofen Ridaura  ABC compound Congesprin Imuran rimadil  Advil Cope Indocin Robaxisal  Alka-Seltzer Effervescent Pain Reliever and  Antacid Coricidin or Coricidin-D  Indomethacin Rufen  Alka-Seltzer plus Cold Medicine Cosprin Ketoprofen S-A-C Tablets  Anacin Analgesic Tablets or Capsules Coumadin Korlgesic Salflex  Anacin Extra Strength Analgesic tablets or capsules CP-2 Tablets Lanoril Salicylate  Anaprox Cuprimine Capsules Levenox Salocol  Anexsia-D Dalteparin Magan Salsalate  Anodynos Darvon compound Magnesium Salicylate Sine-off  Ansaid Dasin  Capsules Magsal Sodium Salicylate  Anturane Depen Capsules Marnal Soma  APF Arthritis pain formula Dewitt's Pills Measurin Stanback  Argesic Dia-Gesic Meclofenamic Sulfinpyrazone  Arthritis Bayer Timed Release Aspirin Diclofenac Meclomen Sulindac  Arthritis pain formula Anacin Dicumarol Medipren Supac  Analgesic (Safety coated) Arthralgen Diffunasal Mefanamic Suprofen  Arthritis Strength Bufferin Dihydrocodeine Mepro Compound Suprol  Arthropan liquid Dopirydamole Methcarbomol with Aspirin Synalgos  ASA tablets/Enseals Disalcid Micrainin Tagament  Ascriptin Doan's Midol Talwin  Ascriptin A/D Dolene Mobidin Tanderil  Ascriptin Extra Strength Dolobid Moblgesic Ticlid  Ascriptin with Codeine Doloprin or Doloprin with Codeine Momentum Tolectin  Asperbuf Duoprin Mono-gesic Trendar  Aspergum Duradyne Motrin or Motrin IB Triminicin  Aspirin plain, buffered or enteric coated Durasal Myochrisine Trigesic  Aspirin Suppositories Easprin Nalfon Trillsate  Aspirin with Codeine Ecotrin Regular or Extra Strength Naprosyn Uracel  Atromid-S Efficin Naproxen Ursinus  Auranofin Capsules Elmiron Neocylate Vanquish  Axotal Emagrin Norgesic Verin  Azathioprine Empirin or Empirin with Codeine Normiflo Vitamin E  Azolid Emprazil Nuprin Voltaren  Bayer Aspirin plain, buffered or children's or timed BC Tablets or powders Encaprin Orgaran Warfarin Sodium  Buff-a-Comp Enoxaparin Orudis Zorpin  Buff-a-Comp with Codeine Equegesic Os-Cal-Gesic   Buffaprin Excedrin plain, buffered or Extra Strength Oxalid   Bufferin Arthritis Strength Feldene Oxphenbutazone   Bufferin plain or Extra Strength Feldene Capsules Oxycodone with Aspirin   Bufferin with Codeine Fenoprofen Fenoprofen Pabalate or Pabalate-SF   Buffets II Flogesic Panagesic   Buffinol plain or Extra Strength Florinal or Florinal with Codeine Panwarfarin   Buf-Tabs Flurbiprofen Penicillamine   Butalbital Compound Four-way cold tablets Penicillin     Butazolidin Fragmin Pepto-Bismol   Carbenicillin Geminisyn Percodan   Carna Arthritis Reliever Geopen Persantine   Carprofen Gold's salt Persistin   Chloramphenicol Goody's Phenylbutazone   Chloromycetin Haltrain Piroxlcam   Clmetidine heparin Plaquenil   Cllnoril Hyco-pap Ponstel   Clofibrate Hydroxy chloroquine Propoxyphen         Before stopping any of these medications, be sure to consult the physician who ordered them.  Some, such as Coumadin (Warfarin) are ordered to prevent or treat serious conditions such as "deep thrombosis", "pumonary embolisms", and other heart problems.  The amount of time that you may need off of the medication may also vary with the medication and the reason for which you were taking it.  If you are taking any of these medications, please make sure you notify your pain physician before you undergo any procedures.   Moderate Conscious Sedation, Adult Sedation is the use of medicines to promote relaxation and relieve discomfort and anxiety. Moderate conscious sedation is a type of sedation. Under moderate conscious sedation, you are less alert than normal, but you are still able to respond to instructions, touch, or both. Moderate conscious sedation is used during short medical and dental procedures. It is milder than deep sedation, which is a type of sedation under which you cannot be easily woken up. It is also milder than general anesthesia, which is the use of medicines to make you unconscious. Moderate conscious sedation allows you to return to your regular activities sooner. Tell a health care provider about:  Any allergies you have.  All  medicines you are taking, including vitamins, herbs, eye drops, creams, and over-the-counter medicines.  Use of steroids (by mouth or creams).  Any problems you or family members have had with sedatives and anesthetic medicines.  Any blood disorders you have.  Any surgeries you have had.  Any medical conditions you  have, such as sleep apnea.  Whether you are pregnant or may be pregnant.  Any use of cigarettes, alcohol, marijuana, or street drugs. What are the risks? Generally, this is a safe procedure. However, problems may occur, including:  Getting too much medicine (oversedation).  Nausea.  Allergic reaction to medicines.  Trouble breathing. If this happens, a breathing tube may be used to help with breathing. It will be removed when you are awake and breathing on your own.  Heart trouble.  Lung trouble. What happens before the procedure? Staying hydrated Follow instructions from your health care provider about hydration, which may include:  Up to 2 hours before the procedure - you may continue to drink clear liquids, such as water, clear fruit juice, black coffee, and plain tea. Eating and drinking restrictions Follow instructions from your health care provider about eating and drinking, which may include:  8 hours before the procedure - stop eating heavy meals or foods such as meat, fried foods, or fatty foods.  6 hours before the procedure - stop eating light meals or foods, such as toast or cereal.  6 hours before the procedure - stop drinking milk or drinks that contain milk.  2 hours before the procedure - stop drinking clear liquids. Medicine Ask your health care provider about:  Changing or stopping your regular medicines. This is especially important if you are taking diabetes medicines or blood thinners.  Taking medicines such as aspirin and ibuprofen. These medicines can thin your blood. Do not take these medicines before your procedure if your health care provider instructs you not to.  Tests and exams  You will have a physical exam.  You may have blood tests done to show: ? How well your kidneys and liver are working. ? How well your blood can clot. General instructions  Plan to have someone take you home from the hospital or clinic.  If you will be going home  right after the procedure, plan to have someone with you for 24 hours. What happens during the procedure?  An IV tube will be inserted into one of your veins.  Medicine to help you relax (sedative) will be given through the IV tube.  The medical or dental procedure will be performed. What happens after the procedure?  Your blood pressure, heart rate, breathing rate, and blood oxygen level will be monitored often until the medicines you were given have worn off.  Do not drive for 24 hours. This information is not intended to replace advice given to you by your health care provider. Make sure you discuss any questions you have with your health care provider. Document Released: 04/04/2001 Document Revised: 12/14/2015 Document Reviewed: 10/30/2015 Elsevier Interactive Patient Education  2019 [employer] Patient Information  Description: The facets are joints in the spine between the vertebrae.  Like any joints in the body, facets can become irritated and painful.  Arthritis can also effect the facets.  By injecting steroids and local anesthetic in and around these joints, we can temporarily block the nerve supply to them.  Steroids act directly on irritated nerves and tissues to reduce selling and inflammation which often leads to decreased pain.  Facet blocks may be done anywhere along the spine from the neck to the low back depending upon the location of your pain.   After numbing the skin with local anesthetic (like Novocaine), a small needle is passed onto the facet joints under x-ray guidance.  You may experience a sensation of pressure while this is being done.  The entire block usually lasts about 15-25 minutes.   Conditions which may be treated by facet blocks:   Low back/buttock pain  Neck/shoulder pain  Certain types of headaches  Preparation for the injection:  1. Do not eat any solid food or dairy products within 8 hours of your appointment. 2. You may drink  clear liquid up to 3 hours before appointment.  Clear liquids include water, black coffee, juice or soda.  No milk or cream please. 3. You may take your regular medication, including pain medications, with a sip of water before your appointment.  Diabetics should hold regular insulin (if taken separately) and take 1/2 normal NPH dose the morning of the procedure.  Carry some sugar containing items with you to your appointment. 4. A driver must accompany you and be prepared to drive you home after your procedure. 5. Bring all your current medications with you. 6. An IV may be inserted and sedation may be given at the discretion of the physician. 7. A blood pressure cuff, EKG and other monitors will often be applied during the procedure.  Some patients may need to have extra oxygen administered for a short period. 8. You will be asked to provide medical information, including your allergies and medications, prior to the procedure.  We must know immediately if you are taking blood thinners (like Coumadin/Warfarin) or if you are allergic to IV iodine contrast (dye).  We must know if you could possible be pregnant.  Possible side-effects:   Bleeding from needle site  Infection (rare, may require surgery)  Nerve injury (rare)  Numbness & tingling (temporary)  Difficulty urinating (rare, temporary)  Spinal headache (a headache worse with upright posture)  Light-headedness (temporary)  Pain at injection site (serveral days)  Decreased blood pressure (rare, temporary)  Weakness in arm/leg (temporary)  Pressure sensation in back/neck (temporary)   Call if you experience:   Fever/chills associated with headache or increased back/neck pain  Headache worsened by an upright position  New onset, weakness or numbness of an extremity below the injection site  Hives or difficulty breathing (go to the emergency room)  Inflammation or drainage at the injection site(s)  Severe back/neck  pain greater than usual  New symptoms which are concerning to you  Please note:  Although the local anesthetic injected can often make your back or neck feel good for several hours after the injection, the pain will likely return. It takes 3-7 days for steroids to work.  You may not notice any pain relief for at least one week.  If effective, we will often do a series of 2-3 injections spaced 3-6 weeks apart to maximally decrease your pain.  After the initial series, you may be a candidate for a more permanent nerve block of the facets.  If you have any questions, please call #[phone]) [facility]

## 2018-08-14 ENCOUNTER — Encounter: Payer: Medicare Other | Admitting: Nurse Practitioner

## 2018-08-26 ENCOUNTER — Encounter: Payer: Self-pay | Admitting: Student in an Organized Health Care Education/Training Program

## 2018-08-26 ENCOUNTER — Ambulatory Visit
Admission: RE | Admit: 2018-08-26 | Discharge: 2018-08-26 | Disposition: A | Discharge status: Home / Self Care | Payer: Medicare Other | Source: Ambulatory Visit | Attending: Student in an Organized Health Care Education/Training Program | Admitting: Student in an Organized Health Care Education/Training Program

## 2018-08-26 ENCOUNTER — Other Ambulatory Visit: Payer: Self-pay

## 2018-08-26 ENCOUNTER — Ambulatory Visit (HOSPITAL_BASED_OUTPATIENT_CLINIC_OR_DEPARTMENT_OTHER): Payer: Medicare Other | Admitting: Student in an Organized Health Care Education/Training Program

## 2018-08-26 VITALS — BP 113/74 | HR 62 | Temp 98.2°F | Resp 16 | Ht 65.0 in | Wt 209.0 lb

## 2018-08-26 DIAGNOSIS — M47812 Spondylosis without myelopathy or radiculopathy, cervical region: Secondary | ICD-10-CM | POA: Insufficient documentation

## 2018-08-26 MED ORDER — ROPIVACAINE HCL 2 MG/ML IJ SOLN
INTRAMUSCULAR | Status: AC
  Filled 2018-08-26: qty 10

## 2018-08-26 MED ORDER — LACTATED RINGERS IV SOLN
1000.0000 mL | Freq: Once | INTRAVENOUS | Status: AC
  Administered 2018-08-26: 1000 mL via INTRAVENOUS

## 2018-08-26 MED ORDER — LIDOCAINE HCL 2 % IJ SOLN
20.0000 mL | Freq: Once | INTRAMUSCULAR | Status: AC
  Administered 2018-08-26: 400 mg

## 2018-08-26 MED ORDER — DEXAMETHASONE SODIUM PHOSPHATE 10 MG/ML IJ SOLN
10.0000 mg | Freq: Once | INTRAMUSCULAR | Status: AC
  Administered 2018-08-26: 10 mg

## 2018-08-26 MED ORDER — DEXAMETHASONE SODIUM PHOSPHATE 10 MG/ML IJ SOLN
INTRAMUSCULAR | Status: AC
  Filled 2018-08-26: qty 1

## 2018-08-26 MED ORDER — FENTANYL CITRATE (PF) 100 MCG/2ML IJ SOLN
25.0000 ug | INTRAMUSCULAR | Status: DC | PRN
  Administered 2018-08-26: 75 ug via INTRAVENOUS

## 2018-08-26 MED ORDER — FENTANYL CITRATE (PF) 100 MCG/2ML IJ SOLN
INTRAMUSCULAR | Status: AC
  Filled 2018-08-26: qty 2

## 2018-08-26 MED ORDER — ROPIVACAINE HCL 2 MG/ML IJ SOLN
10.0000 mL | Freq: Once | INTRAMUSCULAR | Status: AC
  Administered 2018-08-26: 10 mL

## 2018-08-26 MED ORDER — LIDOCAINE HCL 2 % IJ SOLN
INTRAMUSCULAR | Status: AC
  Filled 2018-08-26: qty 20

## 2018-08-26 MED ORDER — GABAPENTIN 300 MG PO CAPS: ORAL_CAPSULE | ORAL | 2 refills | Status: DC

## 2018-08-26 NOTE — Patient Instructions (Signed)

## 2018-08-26 NOTE — Progress Notes (Signed)
Safety precautions to be maintained throughout the outpatient stay will include: orient to surroundings, keep bed in low position, maintain call bell within reach at all times, provide assistance with transfer out of bed and ambulation.  

## 2018-08-26 NOTE — Progress Notes (Signed)
Patient's Name: Tammy Lamb  MRN: [id number]  Referring Provider: Guadlupe Spanish, MD  DOB: June 26, 1951  PCP: Guadlupe Spanish, MD  DOS: 08/26/2018  Note by: Gillis Santa, MD  Service setting: Ambulatory outpatient  Specialty: Interventional Pain Management  Patient type: Established  Location: [facility] (AMB) Pain Management Facility  Visit type: Interventional Procedure   Primary Reason for Visit: Interventional Pain Management Treatment. CC: Neck Pain and Procedure (bilateral C4-C7 medial branch nerve block )  Procedure:          Anesthesia, Analgesia, Anxiolysis:  Type: Cervical Facet Medial Branch Block(s)  #1  Primary Purpose: Diagnostic Region: Posterolateral cervical spine Level:  C4, C5, C6, & C7 Medial Branch Level(s). Injecting these levels blocks the C3-4, C4-5, C5-6, and C6-7 cervical facet joints. Laterality: Bilateral  Type: Moderate (Conscious) Sedation combined with Local Anesthesia Indication(s): Analgesia and Anxiety Route: Intravenous (IV) IV Access: Secured Sedation: Meaningful verbal contact was maintained at all times during the procedure  Local Anesthetic: Lidocaine 1-2%  Position: Prone with head of the table raised to facilitate breathing.   Indications: 1. Cervical facet joint syndrome    Pain Score: Pre-procedure: 8 /10 Post-procedure: 0-No pain/10  Pre-op Assessment:  Tammy Lamb is a 68 y.o. (year old), female patient, seen today for interventional treatment. She  has a past surgical history that includes Tonsillectomy; Cholecystectomy; Abdominal hysterectomy; Diagnostic laparoscopy; Bladder suspension; Colonoscopy; Upper gi endoscopy; Knee arthroscopy (Left, 02/13/2014); Ankle arthroscopy (Left, 03/22/2016); Tendon repair (Left, 03/22/2016); and Lumbar laminectomy/decompression microdiscectomy (N/A, 05/30/2017). Tammy Lamb has a current medication list which includes the following prescription(s): acetaminophen, atorvastatin, carvedilol, vitamin d3, cinnamon,  citalopram, gabapentin, hydrochlorothiazide, melatonin, omeprazole-sodium bicarbonate, phenylephrine, ramipril, sulfasalazine, tizanidine, and aspirin, and the following Facility-Administered Medications: fentanyl. Her primarily concern today is the Neck Pain and Procedure (bilateral C4-C7 medial branch nerve block )  Initial Vital Signs:  Pulse/HCG Rate: 62ECG Heart Rate: 60 Temp: 98.4 F (36.9 C) Resp: 18 BP: (!) 132/97 SpO2: 97 %  BMI: Estimated body mass index is 34.78 kg/m as calculated from the following:   Height as of this encounter: 5' 5"  (1.651 m).   Weight as of this encounter: 209 lb (94.8 kg).  Risk Assessment: Allergies: Reviewed. She is allergic to sulfa antibiotics.  Allergy Precautions: None required Coagulopathies: Reviewed. None identified.  Blood-thinner therapy: None at this time Active Infection(s): Reviewed. None identified. Tammy Lamb is afebrile  Site Confirmation: Tammy Lamb was asked to confirm the procedure and laterality before marking the site Procedure checklist: Completed Consent: Before the procedure and under the influence of no sedative(s), amnesic(s), or anxiolytics, the patient was informed of the treatment options, risks and possible complications. To fulfill our ethical and legal obligations, as recommended by the American Medical Association's Code of Ethics, I have informed the patient of my clinical impression; the nature and purpose of the treatment or procedure; the risks, benefits, and possible complications of the intervention; the alternatives, including doing nothing; the risk(s) and benefit(s) of the alternative treatment(s) or procedure(s); and the risk(s) and benefit(s) of doing nothing. The patient was provided information about the general risks and possible complications associated with the procedure. These may include, but are not limited to: failure to achieve desired goals, infection, bleeding, organ or nerve damage, allergic reactions,  paralysis, and death. In addition, the patient was informed of those risks and complications associated to Spine-related procedures, such as failure to decrease pain; infection (i.e.: Meningitis, epidural or intraspinal abscess); bleeding (i.e.: epidural hematoma, subarachnoid hemorrhage,  or any other type of intraspinal or peri-dural bleeding); organ or nerve damage (i.e.: Any type of peripheral nerve, nerve root, or spinal cord injury) with subsequent damage to sensory, motor, and/or autonomic systems, resulting in permanent pain, numbness, and/or weakness of one or several areas of the body; allergic reactions; (i.e.: anaphylactic reaction); and/or death. Furthermore, the patient was informed of those risks and complications associated with the medications. These include, but are not limited to: allergic reactions (i.e.: anaphylactic or anaphylactoid reaction(s)); adrenal axis suppression; blood sugar elevation that in diabetics may result in ketoacidosis or comma; water retention that in patients with history of congestive heart failure may result in shortness of breath, pulmonary edema, and decompensation with resultant heart failure; weight gain; swelling or edema; medication-induced neural toxicity; particulate matter embolism and blood vessel occlusion with resultant organ, and/or nervous system infarction; and/or aseptic necrosis of one or more joints. Finally, the patient was informed that Medicine is not an exact science; therefore, there is also the possibility of unforeseen or unpredictable risks and/or possible complications that may result in a catastrophic outcome. The patient indicated having understood very clearly. We have given the patient no guarantees and we have made no promises. Enough time was given to the patient to ask questions, all of which were answered to the patient's satisfaction. Tammy Lamb has indicated that she wanted to continue with the procedure. Attestation: I, the ordering  provider, attest that I have discussed with the patient the benefits, risks, side-effects, alternatives, likelihood of achieving goals, and potential problems during recovery for the procedure that I have provided informed consent. Date  Time: 08/26/2018  9:21 AM  Pre-Procedure Preparation:  Monitoring: As per clinic protocol. Respiration, ETCO2, SpO2, BP, heart rate and rhythm monitor placed and checked for adequate function Safety Precautions: Patient was assessed for positional comfort and pressure points before starting the procedure. Time-out: I initiated and conducted the "Time-out" before starting the procedure, as per protocol. The patient was asked to participate by confirming the accuracy of the "Time Out" information. Verification of the correct person, site, and procedure were performed and confirmed by me, the nursing staff, and the patient. "Time-out" conducted as per Joint Commission's Universal Protocol ([email or web address].01.01.01). Time: 1049  Description of Procedure:          Laterality: Bilateral. The procedure was performed in identical fashion on both sides. Level: C3, C4, C5, C6, & C7 Medial Branch Level(s). Area Prepped: Posterior Cervico-thoracic Region Prepping solution: ChloraPrep (2% chlorhexidine gluconate and 70% isopropyl alcohol) Safety Precautions: Aspiration looking for blood return was conducted prior to all injections. At no point did we inject any substances, as a needle was being advanced. Before injecting, the patient was told to immediately notify me if she was experiencing any new onset of "ringing in the ears, or metallic taste in the mouth". No attempts were made at seeking any paresthesias. Safe injection practices and needle disposal techniques used. Medications properly checked for expiration dates. SDV (single dose vial) medications used. After the completion of the procedure, all disposable equipment used was discarded in the proper designated medical waste  containers. Local Anesthesia: Protocol guidelines were followed. The patient was positioned over the fluoroscopy table. The area was prepped in the usual manner. The time-out was completed. The target area was identified using fluoroscopy. A 12-in long, straight, sterile hemostat was used with fluoroscopic guidance to locate the targets for each level blocked. Once located, the skin was marked with an approved surgical skin marker. Once all  sites were marked, the skin (epidermis, dermis, and hypodermis), as well as deeper tissues (fat, connective tissue and muscle) were infiltrated with a small amount of a short-acting local anesthetic, loaded on a 10cc syringe with a 25G, 1.5-in  Needle. An appropriate amount of time was allowed for local anesthetics to take effect before proceeding to the next step. Local Anesthetic: Lidocaine 2.0% The unused portion of the local anesthetic was discarded in the proper designated containers. Technical explanation of process:   C4 Medial Branch Nerve Block (MBB): The target area for the C4 dorsal medial articular branch is the lateral concave waist of the articular pillar of C4. Under fluoroscopic guidance, a Quincke needle was inserted until contact was made with os over the postero-lateral aspect of the articular pillar of C4 (target area). After negative aspiration for blood,1 mL of the nerve block solution was injected without difficulty or complication. The needle was removed intact. C5 Medial Branch Nerve Block (MBB): The target area for the C5 dorsal medial articular branch is the lateral concave waist of the articular pillar of C5. Under fluoroscopic guidance, a Quincke needle was inserted until contact was made with os over the postero-lateral aspect of the articular pillar of C5 (target area). After negative aspiration for blood, 9m of the nerve block solution was injected without difficulty or complication. The needle was removed intact. C6 Medial Branch Nerve  Block (MBB): The target area for the C6 dorsal medial articular branch is the lateral concave waist of the articular pillar of C6. Under fluoroscopic guidance, a Quincke needle was inserted until contact was made with os over the postero-lateral aspect of the articular pillar of C6 (target area). After negative aspiration for blood,1 mL of the nerve block solution was injected without difficulty or complication. The needle was removed intact. C7 Medial Branch Nerve Block (MBB): The target for the C7 dorsal medial articular branch lies on the superior-medial tip of the C7 transverse process. Under fluoroscopic guidance, a Quincke needle was inserted until contact was made with os over the postero-lateral aspect of the articular pillar of C7 (target area). After negative aspiration for blood, 129mof the nerve block solution was injected without difficulty or complication. The needle was removed intact. Procedural Needles: 22-gauge, 3.5-inch, Quincke needles used for all levels. Nerve block solution: 10 cc solution made of 8 cc of 0.2% ropivacaine, 2 cc of Decadron 10 mg/cc.  1 cc injected at each level above, bilaterally.  The unused portion of the solution was discarded in the proper designated containers.  Once the entire procedure was completed, the treated area was cleaned, making sure to leave some of the prepping solution back to take advantage of its long term bactericidal properties.  Vitals:   08/26/18 1110 08/26/18 1119 08/26/18 1129 08/26/18 1139  BP: 137/86 135/85 104/77 113/74  Pulse:      Resp: 14 16 16 16   Temp:  98 F (36.7 C)  98.2 F (36.8 C)  TempSrc:      SpO2: 91% 95% 97% 97%  Weight:      Height:        Start Time: 1049 hrs. End Time: 1110 hrs.  Imaging Guidance (Spinal):          Type of Imaging Technique: Fluoroscopy Guidance (Spinal) Indication(s): Assistance in needle guidance and placement for procedures requiring needle placement in or near specific anatomical  locations not easily accessible without such assistance. Exposure Time: Please see nurses notes. Contrast: None used. Fluoroscopic Guidance: I  was personally present during the use of fluoroscopy. "Tunnel Vision Technique" used to obtain the best possible view of the target area. Parallax error corrected before commencing the procedure. "Direction-depth-direction" technique used to introduce the needle under continuous pulsed fluoroscopy. Once target was reached, antero-posterior, oblique, and lateral fluoroscopic projection used confirm needle placement in all planes. Images permanently stored in EMR. Interpretation: No contrast injected. I personally interpreted the imaging intraoperatively. Adequate needle placement confirmed in multiple planes. Permanent images saved into the patient's record.  Antibiotic Prophylaxis:   Anti-infectives (From admission, onward)   None     Indication(s): None identified  Post-operative Assessment:  Post-procedure Vital Signs:  Pulse/HCG Rate: 6265 Temp: 98.2 F (36.8 C) Resp: 16 BP: 113/74 SpO2: 97 %  EBL: None  Complications: No immediate post-treatment complications observed by team, or reported by patient.  Note: The patient tolerated the entire procedure well. A repeat set of vitals were taken after the procedure and the patient was kept under observation following institutional policy, for this type of procedure. Post-procedural neurological assessment was performed, showing return to baseline, prior to discharge. The patient was provided with post-procedure discharge instructions, including a section on how to identify potential problems. Should any problems arise concerning this procedure, the patient was given instructions to immediately contact us, at any time, without hesitation. In any case, we plan to contact the patient by telephone for a follow-up status report regarding this interventional procedure.  Comments:  No additional relevant  information.  Plan of Care   Imaging Orders     DG C-Arm 1-60 Min-No Report Procedure Orders    No procedure(s) ordered today    Medications ordered for procedure: Meds ordered this encounter  Medications  . lactated ringers infusion 1,000 mL  . fentaNYL (SUBLIMAZE) injection 25-100 mcg    Make sure Narcan is available in the pyxis when using this medication. In the event of respiratory depression (RR< 8/min): Titrate NARCAN (naloxone) in increments of 0.1 to 0.2 mg IV at 2-3 minute intervals, until desired degree of reversal.  . ropivacaine (PF) 2 mg/mL (0.2%) (NAROPIN) injection 10 mL  . lidocaine (XYLOCAINE) 2 % (with pres) injection 400 mg  . dexamethasone (DECADRON) injection 10 mg  . dexamethasone (DECADRON) injection 10 mg  . gabapentin (NEURONTIN) 300 MG capsule    Sig: 300 mg day, 600 mg qhs    Dispense:  90 capsule    Refill:  2    Do not place this medication, or any other prescription from our practice, on "Automatic Refill". Patient may have prescription filled one day early if pharmacy is closed on scheduled refill date.   Medications administered: We administered lactated ringers, fentaNYL, ropivacaine (PF) 2 mg/mL (0.2%), lidocaine, dexamethasone, and dexamethasone.  See the medical record for exact dosing, route, and time of administration.  Disposition: Discharge home  Discharge Date & Time: 08/26/2018; 1140 hrs.   Physician-requested Follow-up: Return in about 4 weeks (around 09/23/2018).  Future Appointments  Date Time Provider [place]  10/01/2018  9:45 AM Gillis Santa, MD [facility] None   Primary Care Physician: Guadlupe Spanish, MD Location: [facility] Note by: Gillis Santa, MD Date: 08/26/2018; Time: 3:16 PM  Disclaimer:  Medicine is not an exact science. The only guarantee in medicine is that nothing is guaranteed. It is important to note that the decision to proceed with this intervention was based on the  information collected from the patient. The Data and conclusions were drawn from the  patient's questionnaire, the interview, and the physical examination. Because the information was provided in large part by the patient, it cannot be guaranteed that it has not been purposely or unconsciously manipulated. Every effort has been made to obtain as much relevant data as possible for this evaluation. It is important to note that the conclusions that lead to this procedure are derived in large part from the available data. Always take into account that the treatment will also be dependent on availability of resources and existing treatment guidelines, considered by other Pain Management Practitioners as being common knowledge and practice, at the time of the intervention. For Medico-Legal purposes, it is also important to point out that variation in procedural techniques and pharmacological choices are the acceptable norm. The indications, contraindications, technique, and results of the above procedure should only be interpreted and judged by a Board-Certified Interventional Pain Specialist with extensive familiarity and expertise in the same exact procedure and technique.

## 2018-08-27 ENCOUNTER — Telehealth: Payer: Self-pay

## 2018-08-27 NOTE — Telephone Encounter (Signed)
Post procedure phone call.  States her pain has come back.  Instructed to put heat on it today and to clal [place] back for any questions or concerns.

## 2018-09-19 ENCOUNTER — Telehealth: Payer: Self-pay

## 2018-09-19 NOTE — Telephone Encounter (Signed)
Her insurance company is not wanting to pay for Roboxin. She would like for  someone to call them and get it worked out.

## 2018-09-19 NOTE — Telephone Encounter (Signed)
Patient called and notifed that her PA had been sent in.

## 2018-09-20 ENCOUNTER — Emergency Department: Payer: Medicare Other

## 2018-09-20 ENCOUNTER — Other Ambulatory Visit: Payer: Self-pay

## 2018-09-20 ENCOUNTER — Encounter: Payer: Self-pay | Admitting: Intensive Care

## 2018-09-20 ENCOUNTER — Emergency Department
Admission: EM | Admit: 2018-09-20 | Discharge: 2018-09-20 | Disposition: A | Discharge status: Home / Self Care | Payer: Medicare Other | Attending: Emergency Medicine | Admitting: Emergency Medicine

## 2018-09-20 DIAGNOSIS — Z79899 Other long term (current) drug therapy: Secondary | ICD-10-CM | POA: Insufficient documentation

## 2018-09-20 DIAGNOSIS — I10 Essential (primary) hypertension: Secondary | ICD-10-CM | POA: Diagnosis not present

## 2018-09-20 DIAGNOSIS — Z7982 Long term (current) use of aspirin: Secondary | ICD-10-CM | POA: Diagnosis not present

## 2018-09-20 DIAGNOSIS — H81399 Other peripheral vertigo, unspecified ear: Secondary | ICD-10-CM | POA: Insufficient documentation

## 2018-09-20 DIAGNOSIS — R42 Dizziness and giddiness: Secondary | ICD-10-CM | POA: Diagnosis present

## 2018-09-20 DIAGNOSIS — F17200 Nicotine dependence, unspecified, uncomplicated: Secondary | ICD-10-CM | POA: Diagnosis not present

## 2018-09-20 DIAGNOSIS — J449 Chronic obstructive pulmonary disease, unspecified: Secondary | ICD-10-CM | POA: Insufficient documentation

## 2018-09-20 LAB — BASIC METABOLIC PANEL
Anion gap: 4 — ABNORMAL LOW (ref 5–15)
BUN: 11 mg/dL (ref 8–23)
CO2: 30 mmol/L (ref 22–32)
Calcium: 8.8 mg/dL — ABNORMAL LOW (ref 8.9–10.3)
Chloride: 100 mmol/L (ref 98–111)
Creatinine, Ser: 0.7 mg/dL (ref 0.44–1.00)
GFR calc Af Amer: >60 mL/min (ref >60)
Glucose, Bld: 97 mg/dL (ref 70–99)
Potassium: 4 mmol/L (ref 3.5–5.1)
Sodium: 134 mmol/L — ABNORMAL LOW (ref 135–145)

## 2018-09-20 LAB — CBC
HCT: 40.7 % (ref 36.0–46.0)
Hemoglobin: 13.2 g/dL (ref 12.0–15.0)
MCH: 29.6 pg (ref 26.0–34.0)
MCHC: 32.4 g/dL (ref 30.0–36.0)
MCV: 91.3 fL (ref 80.0–100.0)
Platelets: 367 10*3/uL (ref 150–400)
RBC: 4.46 MIL/uL (ref 3.87–5.11)
RDW: 12.4 % (ref 11.5–15.5)
WBC: 6.3 10*3/uL (ref 4.0–10.5)
nRBC: 0 % (ref 0.0–0.2)

## 2018-09-20 LAB — URINALYSIS, COMPLETE (UACMP) WITH MICROSCOPIC
Bacteria, UA: NONE SEEN
Bilirubin Urine: NEGATIVE
Glucose, UA: NEGATIVE mg/dL
HGB URINE DIPSTICK: NEGATIVE
Ketones, ur: NEGATIVE mg/dL
Nitrite: NEGATIVE
Protein, ur: NEGATIVE mg/dL
Specific Gravity, Urine: 1.01 (ref 1.005–1.030)
pH: 7 (ref 5.0–8.0)

## 2018-09-20 LAB — TROPONIN I: Troponin I: <0.03 ng/mL (ref <0.03)

## 2018-09-20 MED ORDER — IOHEXOL 350 MG/ML SOLN
75.0000 mL | Freq: Once | INTRAVENOUS | Status: AC | PRN
  Administered 2018-09-20: 75 mL via INTRAVENOUS

## 2018-09-20 MED ORDER — MECLIZINE HCL 25 MG PO TABS: 25.0000 mg | ORAL_TABLET | Freq: Three times a day (TID) | ORAL | 0 refills | Status: DC | PRN

## 2018-09-20 MED ORDER — MECLIZINE HCL 25 MG PO TABS
50.0000 mg | ORAL_TABLET | Freq: Once | ORAL | Status: AC
  Administered 2018-09-20: 50 mg via ORAL
  Filled 2018-09-20: qty 2

## 2018-09-20 MED ORDER — PSEUDOEPHEDRINE HCL 30 MG PO TABS: 30.0000 mg | ORAL_TABLET | ORAL | 0 refills | Status: AC | PRN

## 2018-09-20 NOTE — Discharge Instructions (Addendum)
You have vertigo, likely from inflammation in your right ear.  Your CT scans show no evidence of a stroke, or of any blockage in the blood vessels in your head and neck.  You can take the meclizine as needed for the vertigo symptoms.  I have also prescribed pseudoephedrine to help with inflammation in the middle ear.  Follow-up with your primary care doctor.  We have also given you a referral information for a neurologist in case the symptoms persist and you need to follow-up with the specialist.  Return to the ER for new, worsening, or persistent dizziness, vomiting, severe headache, weakness, or vision changes, or any other new or worsening symptoms that concern you.

## 2018-09-20 NOTE — ED Triage Notes (Addendum)
Patient c/o dizziness since 09/19/18 at 0400 when she got up to go to the bathroom. Denies HX vertigo. No changes in vision. Speech clear. Bilateral grips strong and equal. No weakness in legs. Sees a pain clinic for chronic back and neck pain

## 2018-09-20 NOTE — ED Notes (Signed)
Pt to CT at this time.

## 2018-09-20 NOTE — ED Provider Notes (Signed)
[facility], [place] Emergency Department Provider Note ____________________________________________   First MD Initiated Contact with Patient 09/20/18 2021     (approximate)  I have reviewed the triage vital signs and the nursing notes.   HISTORY  Chief Complaint Dizziness    HPI Tammy Lamb is a 68 y.o. female with PMH as noted below who presents with dizziness, acute onset yesterday morning, described as a spinning or motion sensation, and worse with changes in position.  She states it is associated with nausea but no vomiting.  She denies any prior history of this symptom.  She has no headache, weakness or numbness, facial droop, or vision changes.  Past Medical History:  Diagnosis Date  . Anxiety   . Arthritis   . Back pain    lower back  . Colitis, ulcerative ([place])   . COPD (chronic obstructive pulmonary disease) ([place])   . Depression   . GERD (gastroesophageal reflux disease)   . Headache    sinus/allergy  . Hypertension   . Motion sickness    fair rides  . Stroke ([place]) 2015   mild stroke d/t coreg dose being too high and pt. had severe hypotension.  Marland Kitchen TIA (transient ischemic attack) 2015   no deficits.  per pt due to low BP.  Marland Kitchen Urinary frequency   . Wears dentures    partial upper and lower    Patient Active Problem List   Diagnosis Date Noted  . Compression fracture of lumbar vertebra, non-traumatic, sequela 02/28/2018  . 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 Spinal stenosis, lumbar region, with neurogenic claudication 09/27/2017  . Lumbar radiculopathy 09/27/2017  . Lumbar degenerative disc disease 09/27/2017  . Lumbar facet arthropathy 09/27/2017  . Chronic pain syndrome 09/27/2017  . Neurogenic claudication 05/30/2017    Past Surgical History:  Procedure Laterality Date  . ABDOMINAL HYSTERECTOMY     BSO  . ANKLE ARTHROSCOPY Left 03/22/2016   Procedure: ANKLE ARTHROSCOPY DEBRODEMENT EXTENSIVE LEFT  FLEXAR TENDON REPAIR;  Surgeon: [name], DPM;  Location: [place];  Service: Podiatry;  Laterality: Left;  WITH POPLITEAL  . BLADDER SUSPENSION     tac  . CHOLECYSTECTOMY    . COLONOSCOPY    . DIAGNOSTIC LAPAROSCOPY     adhesions  . KNEE ARTHROSCOPY Left 02/13/2014   Procedure: LEFT ARTHROSCOPY KNEE, PARTIAL MEDIAL MENISECTOMY AND PLICA, CHONDROPLASTY OF PATELLA FEMORAL JOINT;  Surgeon: Alta Corning, MD;  Location: [place];  Service: Orthopedics;  Laterality: Left;  . LUMBAR LAMINECTOMY/DECOMPRESSION MICRODISCECTOMY N/A 05/30/2017   Procedure: LUMBAR LAMINECTOMY/DECOMPRESSION MICRODISCECTOMY 3 LEVELS-L3-S1;  Surgeon: Meade Maw, MD;  Location: [facility];  Service: Neurosurgery;  Laterality: N/A;  . TENDON REPAIR Left 03/22/2016   Procedure: [place];  Surgeon: [name], DPM;  Location: [place];  Service: Podiatry;  Laterality: Left;  . TONSILLECTOMY    . UPPER GI ENDOSCOPY      Prior to Admission medications   Medication Sig Start Date End Date Taking? Authorizing Provider  acetaminophen (TYLENOL) 500 MG tablet Take 1,000 mg by mouth at bedtime as needed (pain).    [provider]  aspirin (ECOTRIN LOW STRENGTH) 81 MG EC tablet Take 81 mg by mouth daily. Swallow whole.    [provider]  atorvastatin (LIPITOR) 80 MG tablet Take 80 mg by mouth daily at 6 PM.    [provider]  carvedilol (COREG) 25 MG tablet Take 12.5 mg by mouth 2 (  two) times daily.    [provider]  Cholecalciferol (VITAMIN D3) 2000 units capsule Take 2,000 Units by mouth at bedtime.    [provider]  CINNAMON PO Take 1,000 mg by mouth at bedtime.    [provider]  citalopram (CELEXA) 20 MG tablet Take 20 mg by mouth every evening.    [provider]  gabapentin (NEURONTIN) 300 MG capsule 300 mg day, 600 mg qhs 08/26/18   Gillis Santa, MD  hydrochlorothiazide (HYDRODIURIL) 25 MG tablet  Take 25 mg by mouth daily.    [provider]  meclizine (ANTIVERT) 25 MG tablet Take 1 tablet (25 mg total) by mouth 3 (three) times daily as needed for dizziness. 09/20/18   Arta Silence, MD  Melatonin 10 MG TABS Take 10 mg by mouth at bedtime as needed (sleep).    [provider]  Omeprazole-Sodium Bicarbonate (ZEGERID OTC) 20-1100 MG CAPS capsule Take 1 capsule by mouth 2 (two) times daily.    [provider]  phenylephrine (NEO-SYNEPHRINE) 1 % nasal spray Place 1 drop into both nostrils daily as needed for congestion.    [provider]  pseudoephedrine (SUDAFED) 30 MG tablet Take 1 tablet (30 mg total) by mouth every 4 (four) hours as needed for up to 7 days for congestion. 09/20/18 09/27/18  Arta Silence, MD  ramipril (ALTACE) 10 MG capsule Take 10 mg by mouth daily.    [provider]  sulfaSALAzine (AZULFIDINE) 500 MG tablet Take 500 mg by mouth 2 (two) times daily.    [provider]  tiZANidine (ZANAFLEX) 2 MG tablet Take 2 tablets (4 mg total) by mouth 2 (two) times daily as needed for muscle spasms. 08/06/18 11/04/18  Gillis Santa, MD    Allergies Sulfa antibiotics  Family History  Problem Relation Age of Onset  . Diabetes Mother   . Hypertension Mother   . Hypertension Father   . COPD Father   . Heart disease Father     Social History Social History   Tobacco Use  . Smoking status: Current Every Day Smoker    Packs/day: 1.00    Years: 49.00    Pack years: 49.00  . Smokeless tobacco: Never Used  Substance Use Topics  . Alcohol use: Yes    Alcohol/week: 0.0 standard drinks    Comment: Occassional,  . Drug use: No    Review of Systems  Constitutional: No fever. Eyes: No visual changes. ENT: No neck pain. Cardiovascular: Denies chest pain. Respiratory: Denies shortness of breath. Gastrointestinal: Positive for nausea. Genitourinary: Negative for flank pain.  Musculoskeletal: Negative for back  pain. Skin: Negative for rash. Neurological: Negative for headache.   ____________________________________________   PHYSICAL EXAM:  VITAL SIGNS: ED Triage Vitals  Enc Vitals Group     BP 09/20/18 1749 (!) 143/95     Pulse Rate 09/20/18 1749 65     Resp 09/20/18 1749 16     Temp 09/20/18 1749 98.1 F (36.7 C)     Temp Source 09/20/18 1749 Oral     SpO2 09/20/18 1749 98 %     Weight 09/20/18 1745 215 lb (97.5 kg)     Height 09/20/18 1745 5' 5"  (1.651 m)     Head Circumference --      Peak Flow --      Pain Score 09/20/18 1745 5     Pain Loc --      Pain Edu? --      Excl. in [place]? --  Constitutional: Alert and oriented.  Relatively well appearing and in no acute distress. Eyes: Conjunctivae are normal.  EOMI.  PERRLA.  Mild nystagmus on left lateral gaze. Head: Atraumatic.  Right TM slightly cloudy appearing.  Left TM normal. Nose: No congestion/rhinnorhea. Mouth/Throat: Mucous membranes are moist.   Neck: Normal range of motion.  Cardiovascular: Normal rate, regular rhythm. Grossly normal heart sounds.  Good peripheral circulation. Respiratory: Normal respiratory effort.  No retractions. Lungs CTAB. Gastrointestinal:  No distention.  Musculoskeletal: No lower extremity edema.  Extremities warm and well perfused.  Neurologic:  Normal speech and language.  Motor and sensory intact in all extremities.  No facial droop.  Normal coordination with no ataxia on finger-to-nose.  No gross focal neurologic deficits are appreciated.  Skin:  Skin is warm and dry. No rash noted. Psychiatric: Mood and affect are normal. Speech and behavior are normal.  ____________________________________________   LABS (all labs ordered are listed, but only abnormal results are displayed)  Labs Reviewed  BASIC METABOLIC PANEL - Abnormal; Notable for the following components:      Result Value   Sodium 134 (*)    Calcium 8.8 (*)    Anion gap 4 (*)    All other components within normal limits   URINALYSIS, COMPLETE (UACMP) WITH MICROSCOPIC - Abnormal; Notable for the following components:   Color, Urine YELLOW (*)    APPearance CLEAR (*)    Leukocytes,Ua LARGE (*)    All other components within normal limits  CBC  TROPONIN I   ____________________________________________  EKG  ED ECG REPORT I, Arta Silence, the attending physician, personally viewed and interpreted this ECG.  Date: 09/20/2018 EKG Time: 1747 Rate: 68 Rhythm: normal sinus rhythm QRS Axis: normal Intervals: normal ST/T Wave abnormalities: normal Narrative Interpretation: no evidence of acute ischemia  ____________________________________________  RADIOLOGY  CT angio head: No acute abnormality CT angio neck: No acute abnormality ____________________________________________   PROCEDURES  Procedure(s) performed: No  Procedures  Critical Care performed: No ____________________________________________   INITIAL IMPRESSION / ASSESSMENT AND PLAN / ED COURSE  Pertinent labs & imaging results that were available during my care of the patient were reviewed by me and considered in my medical decision making (see chart for details).  68 year old female with PMH as noted above including prior history of stroke presents with acute onset of vertigo yesterday morning, worse with changes in position, and associated with nausea.  She denies any prior history of this symptom.  I reviewed the past medical records in Epic; the patient has no recent prior ED visits or admissions.  On exam the patient is overall well-appearing and her vital signs are normal.  Her neuro exam is nonfocal except that she does have mild nystagmus on left lateral gaze.  Her symptoms are exacerbated when she sits up or stands up and so she is unable to ambulate without assistance, however she does not have any focal neuro deficits or any cerebellar signs on exam.  Overall presentation is consistent with peripheral vertigo.   However given the patient's age and risk factors I would like to obtain imaging to rule out central cause.  The patient and family member are particular concerned about possible vascular cause.  Based on shared decision making with the patient we will obtain CT head including angiogram as well as a CT angiogram of the neck.  I will also give meclizine for symptomatic treatment.  If the imaging is negative and the patient symptoms improve I anticipate discharge home.  -----------------------------------------  10:43 PM on 09/20/2018 -----------------------------------------  The patient is feeling somewhat better after the meclizine and feels comfortable going home.  The CTs were negative for ICH, acute stroke, or any vascular abnormality.  At this time, the patient is stable for discharge home.  I gave her thorough return precautions and she expressed understanding.  I will give meclizine for symptomatic treatment as well as pseudoephedrine given that the likely etiology is labyrinthitis. ____________________________________________   FINAL CLINICAL IMPRESSION(S) / ED DIAGNOSES  Final diagnoses:  Peripheral vertigo, unspecified laterality      NEW MEDICATIONS STARTED DURING THIS VISIT:  New Prescriptions   MECLIZINE (ANTIVERT) 25 MG TABLET    Take 1 tablet (25 mg total) by mouth 3 (three) times daily as needed for dizziness.   PSEUDOEPHEDRINE (SUDAFED) 30 MG TABLET    Take 1 tablet (30 mg total) by mouth every 4 (four) hours as needed for up to 7 days for congestion.     Note:  This document was prepared using Dragon voice recognition software and may include unintentional dictation errors.   Arta Silence, MD 09/20/18 2244

## 2018-09-23 ENCOUNTER — Telehealth: Payer: Self-pay | Admitting: *Deleted

## 2018-09-23 NOTE — Telephone Encounter (Signed)
Patient notified that her Tizanidine has been sent to [facility].  Informed patient that Dr Holley Raring was out of town and to make sure her pharmacy is changed at her next visit.

## 2018-10-01 ENCOUNTER — Telehealth: Payer: Self-pay | Admitting: *Deleted

## 2018-10-01 ENCOUNTER — Other Ambulatory Visit: Payer: Self-pay

## 2018-10-01 ENCOUNTER — Encounter: Payer: Self-pay | Admitting: Student in an Organized Health Care Education/Training Program

## 2018-10-01 ENCOUNTER — Ambulatory Visit
Payer: Medicare Other | Attending: Student in an Organized Health Care Education/Training Program | Admitting: Student in an Organized Health Care Education/Training Program

## 2018-10-01 VITALS — BP 146/93 | HR 59 | Temp 99.1°F | Resp 18 | Ht 65.0 in | Wt 210.0 lb

## 2018-10-01 DIAGNOSIS — M542 Cervicalgia: Secondary | ICD-10-CM

## 2018-10-01 DIAGNOSIS — M4856XS Collapsed vertebra, not elsewhere classified, lumbar region, sequela of fracture: Secondary | ICD-10-CM | POA: Insufficient documentation

## 2018-10-01 DIAGNOSIS — M47812 Spondylosis without myelopathy or radiculopathy, cervical region: Secondary | ICD-10-CM | POA: Insufficient documentation

## 2018-10-01 DIAGNOSIS — M4302 Spondylolysis, cervical region: Secondary | ICD-10-CM | POA: Insufficient documentation

## 2018-10-01 DIAGNOSIS — M961 Postlaminectomy syndrome, not elsewhere classified: Secondary | ICD-10-CM | POA: Diagnosis present

## 2018-10-01 DIAGNOSIS — M5412 Radiculopathy, cervical region: Secondary | ICD-10-CM | POA: Diagnosis present

## 2018-10-01 DIAGNOSIS — G8929 Other chronic pain: Secondary | ICD-10-CM | POA: Diagnosis present

## 2018-10-01 DIAGNOSIS — M5030 Other cervical disc degeneration, unspecified cervical region: Secondary | ICD-10-CM | POA: Diagnosis not present

## 2018-10-01 DIAGNOSIS — G894 Chronic pain syndrome: Secondary | ICD-10-CM | POA: Insufficient documentation

## 2018-10-01 MED ORDER — HYDROCODONE-ACETAMINOPHEN 7.5-325 MG PO TABS: 1.0000 | ORAL_TABLET | Freq: Two times a day (BID) | ORAL | 0 refills | Status: DC | PRN

## 2018-10-01 MED ORDER — DIAZEPAM 2 MG PO TABS: 2.0000 mg | ORAL_TABLET | Freq: Once | ORAL | 0 refills | Status: DC | PRN

## 2018-10-01 NOTE — Progress Notes (Signed)
Nursing Pain Medication Assessment:  Safety precautions to be maintained throughout the outpatient stay will include: orient to surroundings, keep bed in low position, maintain call bell within reach at all times, provide assistance with transfer out of bed and ambulation.  Medication Inspection Compliance: Pill count conducted under aseptic conditions, in front of the patient. Neither the pills nor the bottle was removed from the patient's sight at any time. Once count was completed pills were immediately returned to the patient in their original bottle.  Medication: Hydrocodone/APAP Pill/Patch Count: 57 of 90 pills remain Pill/Patch Appearance: Markings consistent with prescribed medication Bottle Appearance: Standard pharmacy container. Clearly labeled. Filled Date:02/ 07/ 2020 Last Medication intake:  Today

## 2018-10-01 NOTE — Telephone Encounter (Signed)
Rx for Valium sent in to be taken for anxiety prior to MRI.

## 2018-10-01 NOTE — Telephone Encounter (Signed)
Called and informed patient that medication was escribed to her drug store

## 2018-10-01 NOTE — Patient Instructions (Signed)
You have been ordered an MRI o your cervical spine.  Your meds have been escribed to your pharmacy.

## 2018-10-01 NOTE — Progress Notes (Signed)
Patient's Name: Tammy Lamb  MRN: [id number]  Referring Provider: Guadlupe Spanish, MD  DOB: 11-Dec-1950  PCP: Guadlupe Spanish, MD  DOS: 10/01/2018  Note by: Gillis Santa, MD  Service setting: Ambulatory outpatient  Specialty: Interventional Pain Management  Location: [facility] (AMB) Pain Management Facility    Patient type: Established   Primary Reason(s) for Visit: Encounter for post-procedure evaluation of chronic illness with mild to moderate exacerbation CC: Neck Pain  HPI  Tammy Lamb is a 68 y.o. year old, female patient, who comes today for a post-procedure evaluation. 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 Her primarily concern today is the Neck Pain  Pain Assessment: Location:   Neck Radiating: radiates to both shoulders and down both arms to elbow Onset: More than a month ago Duration: Chronic pain Quality: Constant, Spasm Severity: 7 /10 (subjective, self-reported pain score)  Note: Reported level is inconsistent with clinical observations.                         When using our objective Pain Scale, levels between 6 and 10/10 are said to belong in an emergency room, as it progressively worsens from a 6/10, described as severely limiting, requiring emergency care not usually available at an outpatient pain management facility. At a 6/10 level, communication becomes difficult and requires great effort. Assistance to reach the emergency department may be required. Facial flushing and profuse sweating along with potentially dangerous increases in heart rate and blood pressure will be  evident. Effect on ADL: "I cant turn my head good" Timing: Constant Modifying factors: denies BP: (!) 146/93  HR: (!) 59  Tammy Lamb comes in today for post-procedure evaluation.  Further details on both, my assessment(s), as well as the proposed treatment plan, please see below.  Has been dealing with vertigo over the last 10 days.  Received meclizine as well as pseudoephedrine.  [facility] negative for any aneurysm, dissection, embolic event.  Patient also follows up status post bilateral cervical facet medial branch nerve blocks at C4, C5, C6, C7.  She states that she had some dizziness and decreased strength on postprocedure day 0 and currently rates 25% ongoing pain relief.  Patient is complaining of increased neck pain with radiation to bilateral hands associated with certain positions.  She is also having dizziness.  Controlled Substance Pharmacotherapy Assessment REMS (Risk Evaluation and Mitigation Strategy)  Analgesic: Hydrocodone 7.5 mg 3 times daily PRN, quantity 90/month MME/day: 22.5 mg/day.  Dewayne Shorter, RN  10/01/2018  9:35 AM  Signed Nursing Pain Medication Assessment:  Safety precautions to be maintained throughout the outpatient stay will include: orient to surroundings, keep bed in low position, maintain call bell within reach at all times, provide assistance with transfer out of bed and ambulation.  Medication Inspection Compliance: Pill count conducted under aseptic conditions, in front of the patient. Neither the pills nor the bottle was removed from the patient's sight at any time. Once count was completed pills were immediately returned to the patient in their original bottle.  Medication: Hydrocodone/APAP Pill/Patch Count: 57 of 90 pills remain Pill/Patch Appearance: Markings consistent with prescribed medication Bottle Appearance: Standard pharmacy container. Clearly labeled. Filled Date:02/  07/ 2020 Last Medication intake:  Today    Pharmacokinetics: Liberation and absorption (onset of action): WNL Distribution (time to peak effect): WNL Metabolism and excretion (duration of action): WNL         Pharmacodynamics: Desired effects: Analgesia: Tammy Lamb reports 50% benefit. Functional ability: Patient reports that medication allows her to accomplish basic ADLs Clinically meaningful improvement in function (CMIF): Sustained CMIF goals met Perceived effectiveness: Described as relatively effective, allowing for increase in activities of daily living (ADL) Undesirable effects: Side-effects or Adverse reactions: None reported Monitoring: [place] PMP: Online review of the past 45-monthperiod conducted. Compliant with practice rules and regulations Last UDS on record: Summary  Date Value Ref Range Status  09/27/2017 FINAL  Final    Comment:    ==================================================================== [facility] COMP DRUG ANALYSIS,UR ==================================================================== Test                             Result       Flag       Units Drug Present and Declared for Prescription Verification   Noroxycodone                   193          EXPECTED   ng/mg creat    Noroxycodone is an expected metabolite of oxycodone. Sources of    oxycodone include scheduled prescription medications.   Citalopram                     PRESENT      EXPECTED   Desmethylcitalopram            PRESENT      EXPECTED    Desmethylcitalopram is an expected metabolite of citalopram or    the enantiomeric form, escitalopram.   Acetaminophen                  PRESENT      EXPECTED Drug Present not Declared for Prescription Verification   Salicylate                     PRESENT      UNEXPECTED Drug Absent but Declared for Prescription Verification   Oxycodone                      Not Detected UNEXPECTED ng/mg creat    Oxycodone is almost always present in patients taking this drug    consistently.  Absence of oxycodone  could be due to lapse of time    since the last dose or unusual pharmacokinetics (rapid    metabolism).   Gabapentin                     Not Detected UNEXPECTED   Methocarbamol                  Not Detected UNEXPECTED ==================================================================== Test                      Result    Flag   Units      Ref Range   Creatinine              30               mg/dL      >=20 ==================================================================== Declared Medications:  The flagging and interpretation on this report are based on the  following declared medications.  Unexpected results may arise from  inaccuracies in the declared medications.  **Note: The testing scope of this panel includes these medications:  Citalopram (Celexa)  Gabapentin  Methocarbamol (Robaxin)  Oxycodone  **Note: The testing scope of this panel does not include small to  moderate amounts of these reported medications:  Acetaminophen  **Note: The testing scope of this panel does not include following  reported medications:  Atorvastatin (Lipitor)  Carvedilol (Coreg)  Hydrochlorothiazide (Hydrodiuril)  Melatonin  Omeprazole (Zegerid)  Phenylephrine  Ramipril (Altace)  Sodium Bicarbonate (Zegerid)  Sulfasalazine (Azulfidine)  Vitamin D3 ==================================================================== For clinical consultation, please call [phone]. ====================================================================    UDS interpretation: Compliant          Medication Assessment Form: Reviewed. Patient indicates being compliant with therapy Treatment compliance: Compliant Risk Assessment Profile: Aberrant behavior: See initial evaluations. None observed or detected today Comorbid factors increasing risk of overdose: See initial evaluation. No additional risks detected today Opioid risk tool (ORT):  Opioid Risk  08/13/2018  Alcohol 0  Illegal Drugs 0  Rx Drugs 0   Alcohol 0  Illegal Drugs 0  Rx Drugs 0  Age between 16-45 years  0  History of Preadolescent Sexual Abuse 0  Psychological Disease 0  Depression 0  Opioid Risk Tool Scoring 0  Opioid Risk Interpretation Low Risk    ORT Scoring interpretation table:  Score <3 = Low Risk for SUD  Score between 4-7 = Moderate Risk for SUD  Score >8 = High Risk for Opioid Abuse   Risk of substance use disorder (SUD): Low  Risk Mitigation Strategies:  Patient Counseling: Covered Patient-Prescriber Agreement (PPA): Present and active  Notification to other healthcare providers: Done  Pharmacologic Plan: Decrease monthly quantity to 60/month given increased vertigo             Post-Procedure Assessment  08/26/2018 Procedure: Bilateral C4, C5, C6, C7 cervical facet medial branch nerve block Pre-procedure pain score:        /10 Post-procedure pain score: 0/10         Influential Factors: BMI: 34.95 kg/m Intra-procedural challenges: None observed.         Assessment challenges: None detected.              Reported side-effects: None.        Post-procedural adverse reactions or complications:   Dizziness evening of procedure.         Sedation: Please see nurses note. When no sedatives are used, the analgesic levels obtained are directly associated to the effectiveness of the local anesthetics. However, when sedation is provided, the level of analgesia obtained during the initial 1 hour following the intervention, is believed to be the result of a combination of factors. These factors may include, but are not limited to: 1. The effectiveness of the local anesthetics used. 2. The effects of the analgesic(s) and/or anxiolytic(s) used. 3. The degree of discomfort experienced by the patient at the time of the procedure. 4. The patients ability and reliability in recalling and recording the events. 5. The presence and influence of possible secondary gains and/or psychosocial factors. Reported result: Relief  experienced during the 1st hour after the procedure: 100 % (Ultra-Short Term Relief)            Interpretative annotation: Clinically appropriate result. Analgesia during this period is likely to be Local Anesthetic and/or IV Sedative (Analgesic/Anxiolytic) related.          Effects of local anesthetic: The analgesic  effects attained during this period are directly associated to the localized infiltration of local anesthetics and therefore cary significant diagnostic value as to the etiological location, or anatomical origin, of the pain. Expected duration of relief is directly dependent on the pharmacodynamics of the local anesthetic used. Long-acting (4-6 hours) anesthetics used.  Reported result: Relief during the next 4 to 6 hour after the procedure: 90 % (Short-Term Relief)            Interpretative annotation: Clinically appropriate result. Analgesia during this period is likely to be Local Anesthetic-related.          Long-term benefit: Defined as the period of time past the expected duration of local anesthetics (1 hour for short-acting and 4-6 hours for long-acting). With the possible exception of prolonged sympathetic blockade from the local anesthetics, benefits during this period are typically attributed to, or associated with, other factors such as analgesic sensory neuropraxia, antiinflammatory effects, or beneficial biochemical changes provided by agents other than the local anesthetics.  Reported result: Extended relief following procedure: 25 %(couple of weeks) (Long-Term Relief)            Interpretative annotation: Clinically possible results. Good relief. No permanent benefit expected. Inflammation plays a part in the etiology to the pain.          Current benefits: Defined as reported results that persistent at this point in time.   Analgesia: 25 %            Function: Somewhat improved ROM: Somewhat improved Interpretative annotation: Recurrence of symptoms. No permanent benefit  expected. Effective diagnostic intervention.          Interpretation: Results would suggest a successful diagnostic intervention.                  Plan:  Please see "Plan of Care" for details.                Laboratory Chemistry  Inflammation Markers (CRP: Acute Phase) (ESR: Chronic Phase) No results found for: CRP, ESRSEDRATE, LATICACIDVEN                       Rheumatology Markers No results found for: RF, ANA, LABURIC, URICUR, LYMEIGGIGMAB, LYMEABIGMQN, HLAB27                      Renal Function Markers Lab Results  Component Value Date   BUN 11 09/20/2018   CREATININE 0.70 09/20/2018   GFRAA >60 09/20/2018   GFRNONAA >60 09/20/2018                             Hepatic Function Markers No results found for: AST, ALT, ALBUMIN, ALKPHOS, HCVAB, AMYLASE, LIPASE, AMMONIA                      Electrolytes Lab Results  Component Value Date   NA 134 (L) 09/20/2018   K 4.0 09/20/2018   CL 100 09/20/2018   CALCIUM 8.8 (L) 09/20/2018                        Neuropathy Markers No results found for: VITAMINB12, FOLATE, HGBA1C, HIV                      CNS Tests No results found for: COLORCSF, APPEARCSF, RBCCOUNTCSF, WBCCSF, POLYSCSF, LYMPHSCSF, EOSCSF, PROTEINCSF, GLUCCSF, JCVIRUS, CSFOLI,  IGGCSF                      Bone Pathology Markers No results found for: VD25OH, [id number], [id number], [id number], 25OHVITD1, 25OHVITD2, 25OHVITD3, TESTOFREE, TESTOSTERONE                       Coagulation Parameters Lab Results  Component Value Date   INR 1.06 05/28/2017   LABPROT 13.7 05/28/2017   APTT 31 05/28/2017   PLT 367 09/20/2018                        Cardiovascular Markers Lab Results  Component Value Date   TROPONINI <0.03 09/20/2018   HGB 13.2 09/20/2018   HCT 40.7 09/20/2018                         CA Markers No results found for: CEA, CA125, LABCA2                      Endocrine Markers No results found for: TSH, FREET4, TESTOFREE, TESTOSTERONE, ESTRADIOL,  ESTRADIOLPCT, ESTRADIOLFRE                      Note: Lab results reviewed.  Recent Diagnostic Imaging Results  CT Angio Neck W and/or Wo Contrast CLINICAL DATA:  Dizziness with chronic neck and back pain.  EXAM: CT ANGIOGRAPHY HEAD AND NECK  TECHNIQUE: Multidetector CT imaging of the head and neck was performed using the standard protocol during bolus administration of intravenous contrast. Multiplanar CT image reconstructions and MIPs were obtained to evaluate the vascular anatomy. Carotid stenosis measurements (when applicable) are obtained utilizing NASCET criteria, using the distal internal carotid diameter as the denominator.  CONTRAST:  29m OMNIPAQUE IOHEXOL 350 MG/ML SOLN  COMPARISON:  None.  FINDINGS: CT HEAD FINDINGS  Brain: There is no mass, hemorrhage or extra-axial collection. The size and configuration of the ventricles and extra-axial CSF spaces are normal. There is no acute or chronic infarction. The brain parenchyma is normal.  Skull: The visualized skull base, calvarium and extracranial soft tissues are normal.  Sinuses/Orbits: No fluid levels or advanced mucosal thickening of the visualized paranasal sinuses. No mastoid or middle ear effusion. The orbits are normal.  CTA NECK FINDINGS  SKELETON: There is no bony spinal canal stenosis. No lytic or blastic lesion.  OTHER NECK: Normal pharynx, larynx and major salivary glands. No cervical lymphadenopathy. Unremarkable thyroid gland.  UPPER CHEST: Mild emphysematous change.  AORTIC ARCH: There is mild calcific atherosclerosis of the aortic arch. There is no aneurysm, dissection or hemodynamically significant stenosis of the visualized ascending aorta and aortic arch. Conventional 3 vessel aortic branching pattern. The visualized proximal subclavian arteries are widely patent.  RIGHT CAROTID SYSTEM:  --Common carotid artery: Widely patent origin without common carotid artery dissection or  aneurysm.  --Internal carotid artery: No dissection, occlusion or aneurysm. Mild atherosclerotic calcification at the carotid bifurcation without hemodynamically significant stenosis.  --External carotid artery: No acute abnormality.  LEFT CAROTID SYSTEM:  --Common carotid artery: Widely patent origin without common carotid artery dissection or aneurysm.  --Internal carotid artery: Normal without aneurysm, dissection or stenosis.  --External carotid artery: No acute abnormality.  VERTEBRAL ARTERIES: Codominant configuration. Both origins are normal. No dissection, occlusion or flow-limiting stenosis to the vertebrobasilar confluence.  CTA HEAD FINDINGS  POSTERIOR CIRCULATION:  --Basilar artery: Normal.  --Posterior cerebral arteries: Normal.  Both originate from the basilar artery.  --Superior cerebellar arteries: Normal.  --Inferior cerebellar arteries: Normal anterior and posterior inferior cerebellar arteries.  ANTERIOR CIRCULATION:  --Intracranial internal carotid arteries: Normal.  --Anterior cerebral arteries: Normal. Both A1 segments are present. Patent anterior communicating artery.  --Middle cerebral arteries: Normal.  --Posterior communicating arteries: Absent bilaterally.  VENOUS SINUSES: As permitted by contrast timing, patent.  ANATOMIC VARIANTS: None  DELAYED PHASE: Not performed.  Review of the MIP images confirms the above findings.  IMPRESSION: 1. No emergent large vessel occlusion, hemodynamically significant stenosis or aneurysm of the head and neck. 2. Mild aortic atherosclerosis (ICD10-I70.0)  Electronically Signed   By: Ulyses Jarred M.D.   On: 09/20/2018 21:45 CT Angio Head W or Wo Contrast CLINICAL DATA:  Dizziness with chronic neck and back pain.  EXAM: CT ANGIOGRAPHY HEAD AND NECK  TECHNIQUE: Multidetector CT imaging of the head and neck was performed using the standard protocol during bolus administration of  intravenous contrast. Multiplanar CT image reconstructions and MIPs were obtained to evaluate the vascular anatomy. Carotid stenosis measurements (when applicable) are obtained utilizing NASCET criteria, using the distal internal carotid diameter as the denominator.  CONTRAST:  39m OMNIPAQUE IOHEXOL 350 MG/ML SOLN  COMPARISON:  None.  FINDINGS: CT HEAD FINDINGS  Brain: There is no mass, hemorrhage or extra-axial collection. The size and configuration of the ventricles and extra-axial CSF spaces are normal. There is no acute or chronic infarction. The brain parenchyma is normal.  Skull: The visualized skull base, calvarium and extracranial soft tissues are normal.  Sinuses/Orbits: No fluid levels or advanced mucosal thickening of the visualized paranasal sinuses. No mastoid or middle ear effusion. The orbits are normal.  CTA NECK FINDINGS  SKELETON: There is no bony spinal canal stenosis. No lytic or blastic lesion.  OTHER NECK: Normal pharynx, larynx and major salivary glands. No cervical lymphadenopathy. Unremarkable thyroid gland.  UPPER CHEST: Mild emphysematous change.  AORTIC ARCH: There is mild calcific atherosclerosis of the aortic arch. There is no aneurysm, dissection or hemodynamically significant stenosis of the visualized ascending aorta and aortic arch. Conventional 3 vessel aortic branching pattern. The visualized proximal subclavian arteries are widely patent.  RIGHT CAROTID SYSTEM:  --Common carotid artery: Widely patent origin without common carotid artery dissection or aneurysm.  --Internal carotid artery: No dissection, occlusion or aneurysm. Mild atherosclerotic calcification at the carotid bifurcation without hemodynamically significant stenosis.  --External carotid artery: No acute abnormality.  LEFT CAROTID SYSTEM:  --Common carotid artery: Widely patent origin without common carotid artery dissection or aneurysm.  --Internal carotid  artery: Normal without aneurysm, dissection or stenosis.  --External carotid artery: No acute abnormality.  VERTEBRAL ARTERIES: Codominant configuration. Both origins are normal. No dissection, occlusion or flow-limiting stenosis to the vertebrobasilar confluence.  CTA HEAD FINDINGS  POSTERIOR CIRCULATION:  --Basilar artery: Normal.  --Posterior cerebral arteries: Normal. Both originate from the basilar artery.  --Superior cerebellar arteries: Normal.  --Inferior cerebellar arteries: Normal anterior and posterior inferior cerebellar arteries.  ANTERIOR CIRCULATION:  --Intracranial internal carotid arteries: Normal.  --Anterior cerebral arteries: Normal. Both A1 segments are present. Patent anterior communicating artery.  --Middle cerebral arteries: Normal.  --Posterior communicating arteries: Absent bilaterally.  VENOUS SINUSES: As permitted by contrast timing, patent.  ANATOMIC VARIANTS: None  DELAYED PHASE: Not performed.  Review of the MIP images confirms the above findings.  IMPRESSION: 1. No emergent large vessel occlusion, hemodynamically significant stenosis or aneurysm of the head and neck. 2. Mild aortic atherosclerosis (ICD10-I70.0)  Electronically Signed  By: Ulyses Jarred M.D.   On: 09/20/2018 21:45  Complexity Note: Imaging results reviewed. Results shared with Ms. Persico, using Layman's terms.                               Meds   Current Outpatient Medications:  .  acetaminophen (TYLENOL) 500 MG tablet, Take 1,000 mg by mouth at bedtime as needed (pain)., Disp: , Rfl:  .  aspirin (ECOTRIN LOW STRENGTH) 81 MG EC tablet, Take 81 mg by mouth daily. Swallow whole., Disp: , Rfl:  .  atorvastatin (LIPITOR) 80 MG tablet, Take 80 mg by mouth daily at 6 PM., Disp: , Rfl:  .  carvedilol (COREG) 25 MG tablet, Take 12.5 mg by mouth 2 (two) times daily., Disp: , Rfl:  .  Cholecalciferol (VITAMIN D3) 2000 units capsule, Take 2,000 Units by mouth at  bedtime., Disp: , Rfl:  .  CINNAMON PO, Take 1,000 mg by mouth at bedtime., Disp: , Rfl:  .  citalopram (CELEXA) 20 MG tablet, Take 20 mg by mouth every evening., Disp: , Rfl:  .  gabapentin (NEURONTIN) 300 MG capsule, 300 mg day, 600 mg qhs, Disp: 90 capsule, Rfl: 2 .  hydrochlorothiazide (HYDRODIURIL) 25 MG tablet, Take 25 mg by mouth daily., Disp: , Rfl:  .  [START ON 10/23/2018] HYDROcodone-acetaminophen (NORCO) 7.5-325 MG tablet, Take 1 tablet by mouth every 12 (twelve) hours as needed for up to 30 days. for pain, Disp: 60 tablet, Rfl: 0 .  meclizine (ANTIVERT) 25 MG tablet, Take 1 tablet (25 mg total) by mouth 3 (three) times daily as needed for dizziness., Disp: 30 tablet, Rfl: 0 .  Melatonin 10 MG TABS, Take 10 mg by mouth at bedtime as needed (sleep)., Disp: , Rfl:  .  Omeprazole-Sodium Bicarbonate (ZEGERID OTC) 20-1100 MG CAPS capsule, Take 1 capsule by mouth 2 (two) times daily., Disp: , Rfl:  .  phenylephrine (NEO-SYNEPHRINE) 1 % nasal spray, Place 1 drop into both nostrils daily as needed for congestion., Disp: , Rfl:  .  ramipril (ALTACE) 10 MG capsule, Take 10 mg by mouth daily., Disp: , Rfl:  .  sulfaSALAzine (AZULFIDINE) 500 MG tablet, Take 500 mg by mouth 2 (two) times daily., Disp: , Rfl:  .  tiZANidine (ZANAFLEX) 2 MG tablet, Take 2 tablets (4 mg total) by mouth 2 (two) times daily as needed for muscle spasms., Disp: 120 tablet, Rfl: 2 .  [START ON 11/22/2018] HYDROcodone-acetaminophen (NORCO) 7.5-325 MG tablet, Take 1 tablet by mouth every 12 (twelve) hours as needed for up to 30 days for severe pain. Must last 30 days., Disp: 60 tablet, Rfl: 0  ROS  Constitutional: Denies any fever or chills Gastrointestinal: No reported hemesis, hematochezia, vomiting, or acute GI distress Musculoskeletal: Denies any acute onset joint swelling, redness, loss of ROM, or weakness Neurological: No reported episodes of acute onset apraxia, aphasia, dysarthria, agnosia, amnesia, paralysis, loss of  coordination, or loss of consciousness  Allergies  Ms. Shedrick is allergic to sulfa antibiotics.  PFSH  Drug: Ms. Jamroz  reports no history of drug use. Alcohol:  reports current alcohol use. Tobacco:  reports that she has been smoking. She has a 49.00 pack-year smoking history. She has never used smokeless tobacco. Medical:  has a past medical history of Anxiety, Arthritis, Back pain, Colitis, ulcerative ([place]), COPD (chronic obstructive pulmonary disease) ([place]), Depression, GERD (gastroesophageal reflux disease), Headache, Hypertension, Motion sickness, Stroke ([place]) (2015), TIA (  transient ischemic attack) (2015), Urinary frequency, and Wears dentures. Surgical: Ms. Ogburn  has a past surgical history that includes Tonsillectomy; Cholecystectomy; Abdominal hysterectomy; Diagnostic laparoscopy; Bladder suspension; Colonoscopy; Upper gi endoscopy; Knee arthroscopy (Left, 02/13/2014); Ankle arthroscopy (Left, 03/22/2016); Tendon repair (Left, 03/22/2016); and Lumbar laminectomy/decompression microdiscectomy (N/A, 05/30/2017). Family: family history includes COPD in her father; Diabetes in her mother; Heart disease in her father; Hypertension in her father and mother.  Constitutional Exam  General appearance: alert and cooperative Vitals:   10/01/18 0927  BP: (!) 146/93  Pulse: (!) 59  Resp: 18  Temp: 99.1 F (37.3 C)  SpO2: 100%  Weight: 210 lb (95.3 kg)  Height: 5' 5"  (1.651 m)   BMI Assessment: Estimated body mass index is 34.95 kg/m as calculated from the following:   Height as of this encounter: 5' 5"  (1.651 m).   Weight as of this encounter: 210 lb (95.3 kg).  BMI interpretation table: BMI level Category Range association with higher incidence of chronic pain  <18 kg/m2 Underweight   18.5-24.9 kg/m2 Ideal body weight   25-29.9 kg/m2 Overweight Increased incidence by 20%  30-34.9 kg/m2 Obese (Class I) Increased incidence by 68%  35-39.9 kg/m2 Severe obesity (Class II) Increased  incidence by 136%  >40 kg/m2 Extreme obesity (Class III) Increased incidence by 254%   Patient's current BMI Ideal Body weight  Body mass index is 34.95 kg/m. Ideal body weight: 57 kg (125 lb 10.6 oz) Adjusted ideal body weight: 72.3 kg (159 lb 6.4 oz)   BMI Readings from Last 4 Encounters:  10/01/18 34.95 kg/m  09/20/18 35.78 kg/m  08/26/18 34.78 kg/m  08/13/18 34.95 kg/m   Wt Readings from Last 4 Encounters:  10/01/18 210 lb (95.3 kg)  09/20/18 215 lb (97.5 kg)  08/26/18 209 lb (94.8 kg)  08/13/18 210 lb (95.3 kg)  Psych/Mental status: Alert, oriented x 3 (person, place, & time)       Eyes: PERLA Respiratory: No evidence of acute respiratory distress  Cervical Spine Area Exam  Skin & Axial Inspection: No masses, redness, edema, swelling, or associated skin lesions Alignment: Asymmetric Functional ROM: Diminished ROM, bilaterally Stability: No instability detected Muscle Tone/Strength: Functionally intact. No obvious neuro-muscular anomalies detected. Sensory (Neurological): Dermatomal pain pattern Palpation: Complains of area being tender to palpation              Upper Extremity (UE) Exam    Side: Right upper extremity  Side: Left upper extremity  Skin & Extremity Inspection: Skin color, temperature, and hair growth are WNL. No peripheral edema or cyanosis. No masses, redness, swelling, asymmetry, or associated skin lesions. No contractures.  Skin & Extremity Inspection: Skin color, temperature, and hair growth are WNL. No peripheral edema or cyanosis. No masses, redness, swelling, asymmetry, or associated skin lesions. No contractures.  Functional ROM: Decreased ROM for shoulder and elbow  Functional ROM: Decreased ROM for shoulder and elbow  Muscle Tone/Strength: Functionally intact. No obvious neuro-muscular anomalies detected.  Muscle Tone/Strength: Functionally intact. No obvious neuro-muscular anomalies detected.  Sensory (Neurological): Dermatomal pain pattern           Sensory (Neurological): Dermatomal pain pattern          Palpation: No palpable anomalies              Palpation: No palpable anomalies              Provocative Test(s):  Phalen's test: deferred Tinel's test: deferred Apley's scratch test (touch opposite shoulder):  Action 1 (  Across chest): Decreased ROM Action 2 (Overhead): Decreased ROM Action 3 (LB reach): Decreased ROM   Provocative Test(s):  Phalen's test: deferred Tinel's test: deferred Apley's scratch test (touch opposite shoulder):  Action 1 (Across chest): Decreased ROM Action 2 (Overhead): Decreased ROM Action 3 (LB reach): Decreased ROM    Thoracic Spine Area Exam  Skin & Axial Inspection: No masses, redness, or swelling Alignment: Symmetrical Functional ROM: Unrestricted ROM Stability: No instability detected Muscle Tone/Strength: Functionally intact. No obvious neuro-muscular anomalies detected. Sensory (Neurological): Unimpaired Muscle strength & Tone: No palpable anomalies  Lumbar Spine Area Exam  Skin & Axial Inspection: Well healed scar from previous spine surgery detected Alignment: Asymmetric Functional ROM: Decreased ROM       Stability: No instability detected Muscle Tone/Strength: Functionally intact. No obvious neuro-muscular anomalies detected. Sensory (Neurological): Dermatomal pain pattern Palpation: No palpable anomalies       Provocative Tests: Hyperextension/rotation test: deferred today       Lumbar quadrant test (Kemp's test): deferred today       Lateral bending test: deferred today       Patrick's Maneuver: deferred today                   FABER* test: deferred today                   S-I anterior distraction/compression test: deferred today         S-I lateral compression test: deferred today         S-I Thigh-thrust test: deferred today         S-I Gaenslen's test: deferred today         *(Flexion, ABduction and External Rotation)  Gait & Posture Assessment  Ambulation:  Limited Gait: Limited. Using assistive device to ambulate Posture: Difficulty standing up straight, due to pain   Lower Extremity Exam    Side: Right lower extremity  Side: Left lower extremity  Stability: No instability observed          Stability: No instability observed          Skin & Extremity Inspection: Skin color, temperature, and hair growth are WNL. No peripheral edema or cyanosis. No masses, redness, swelling, asymmetry, or associated skin lesions. No contractures.  Skin & Extremity Inspection: Skin color, temperature, and hair growth are WNL. No peripheral edema or cyanosis. No masses, redness, swelling, asymmetry, or associated skin lesions. No contractures.  Functional ROM: Decreased ROM for hip and knee joints          Functional ROM: Decreased ROM for hip and knee joints          Muscle Tone/Strength: Functionally intact. No obvious neuro-muscular anomalies detected.  Muscle Tone/Strength: Functionally intact. No obvious neuro-muscular anomalies detected.  Sensory (Neurological): Dermatomal pain pattern        Sensory (Neurological): Dermatomal pain pattern        DTR: Patellar: deferred today Achilles: deferred today Plantar: deferred today  DTR: Patellar: deferred today Achilles: deferred today Plantar: deferred today  Palpation: No palpable anomalies  Palpation: No palpable anomalies   Assessment   Status Diagnosis  Having a Flare-up Having a Flare-up Having a Flare-up 1. Chronic radicular cervical pain   2. Cervical facet joint syndrome   3. DDD (degenerative disc disease), cervical   4. Cervical spondylolysis   5. 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 Chronic pain syndrome   8. Compression fracture of lumbar vertebra,  non-traumatic, sequela      Updated Problems: Problem  Cervical Facet Joint Syndrome  Ddd (Degenerative Disc Disease), Cervical  Chronic Radicular Cervical Pain  General Recommendations: The pain  condition that the patient suffers from is best treated with a multidisciplinary approach that involves an increase in physical activity to prevent de-conditioning and worsening of the pain cycle, as well as psychological counseling (formal and/or informal) to address the co-morbid psychological affects of pain. Treatment will often involve judicious use of pain medications and interventional procedures to decrease the pain, allowing the patient to participate in the physical activity that will ultimately produce long-lasting pain reductions. The goal of the multidisciplinary approach is to return the patient to a higher level of overall function and to restore their ability to perform activities of daily living.  Patient endorsing worsening neck pain with radiation to bilateral hands with associated weakness.  Physical exam and clinical history point towards probable radicular component related to canal or foraminal stenosis.  Would like to further evaluate with cervical MRI without contrast.  Given patient's increased symptoms of vertigo and balance issues, recommend decreasing hydrocodone to quantity 60/month, twice daily PRN.  Will avoid other centrally acting medications that could increase her risk of fall in the context of worsening vertigo.  At this time, patient wants to postpone proceeding with cervical facet medial branch nerve block #2 and possible radiofrequency ablation given symptoms of vertigo and dizziness.  This is reasonable.  Can reconsider in the future.  Plan of Care  Pharmacotherapy (Medications Ordered): Meds ordered this encounter  Medications  . HYDROcodone-acetaminophen (NORCO) 7.5-325 MG tablet    Sig: Take 1 tablet by mouth every 12 (twelve) hours as needed for up to 30 days. for pain    Dispense:  60 tablet    Refill:  0  . HYDROcodone-acetaminophen (NORCO) 7.5-325 MG tablet    Sig: Take 1 tablet by mouth every 12 (twelve) hours as needed for up to 30 days for severe  pain. Must last 30 days.    Dispense:  60 tablet    Refill:  0    [place] STOP ACT - Not applicable. Fill one day early if pharmacy is closed on scheduled refill date.   Lab-work, procedure(s), and/or referral(s): Orders Placed This Encounter  Procedures  . MR CERVICAL SPINE WO CONTRAST    Provider-requested follow-up: Return in about 11 weeks (around 12/17/2018) for Medication Management.  Future Appointments  Date Time Provider [place]  12/17/2018  9:45 AM Gillis Santa, MD [facility] None    Primary Care Physician: Guadlupe Spanish, MD Location: [facility] Note by: Gillis Santa, M.D Date: 10/01/2018; Time: 10:09 AM  Patient Instructions  You have been ordered an MRI o your cervical spine.  Your meds have been escribed to your pharmacy.

## 2018-10-08 ENCOUNTER — Ambulatory Visit
Admission: RE | Admit: 2018-10-08 | Discharge: 2018-10-08 | Disposition: A | Discharge status: Home / Self Care | Payer: Medicare Other | Source: Ambulatory Visit | Attending: Student in an Organized Health Care Education/Training Program | Admitting: Student in an Organized Health Care Education/Training Program

## 2018-10-08 ENCOUNTER — Other Ambulatory Visit: Payer: Self-pay

## 2018-10-08 DIAGNOSIS — M5412 Radiculopathy, cervical region: Secondary | ICD-10-CM | POA: Diagnosis present

## 2018-10-08 DIAGNOSIS — G8929 Other chronic pain: Secondary | ICD-10-CM | POA: Diagnosis present

## 2018-10-10 NOTE — Addendum Note (Signed)
Addended by: Gillis Santa on: 10/10/2018 10:46 AM   Modules accepted: Orders

## 2018-10-15 ENCOUNTER — Other Ambulatory Visit: Payer: Self-pay | Admitting: Student in an Organized Health Care Education/Training Program

## 2018-11-16 ENCOUNTER — Other Ambulatory Visit: Payer: Self-pay | Admitting: Student in an Organized Health Care Education/Training Program

## 2018-12-04 ENCOUNTER — Other Ambulatory Visit: Payer: Self-pay | Admitting: Sports Medicine

## 2018-12-04 DIAGNOSIS — M7542 Impingement syndrome of left shoulder: Secondary | ICD-10-CM

## 2018-12-04 DIAGNOSIS — G8929 Other chronic pain: Secondary | ICD-10-CM

## 2018-12-09 ENCOUNTER — Telehealth: Payer: Self-pay

## 2018-12-09 MED ORDER — GABAPENTIN 300 MG PO CAPS: ORAL_CAPSULE | ORAL | 2 refills | Status: DC

## 2018-12-09 MED ORDER — TIZANIDINE HCL 4 MG PO TABS: 4.0000 mg | ORAL_TABLET | Freq: Two times a day (BID) | ORAL | 2 refills | Status: DC | PRN

## 2018-12-09 NOTE — Telephone Encounter (Signed)
Patient notified that Dr Holley Raring was out of the office until next week.  Will forward to Dr Holley Raring.

## 2018-12-09 NOTE — Telephone Encounter (Signed)
Gabapentin and Tizanidine sent to pharmacy.  Requested Prescriptions   Signed Prescriptions Disp Refills  . gabapentin (NEURONTIN) 300 MG capsule 90 capsule 2    Sig: 300 mg day, 600 mg qhs    Authorizing Provider: Mickal Meno  . tiZANidine (ZANAFLEX) 4 MG tablet 60 tablet 2    Sig: Take 1 tablet (4 mg total) by mouth 2 (two) times daily as needed for up to 30 days for muscle spasms.    Authorizing Provider: Gillis Santa

## 2018-12-09 NOTE — Telephone Encounter (Signed)
Patient notified

## 2018-12-09 NOTE — Telephone Encounter (Signed)
She is out of muscle relaxer's and will be out of gabapentin on Monday. She has a vv on Tuesday next week but wants to know if she can get her muscle relaxer's called out to [place].

## 2018-12-12 ENCOUNTER — Other Ambulatory Visit: Payer: Self-pay

## 2018-12-12 ENCOUNTER — Ambulatory Visit
Admission: RE | Admit: 2018-12-12 | Discharge: 2018-12-12 | Disposition: A | Discharge status: Home / Self Care | Payer: Medicare Other | Source: Ambulatory Visit | Attending: Sports Medicine | Admitting: Sports Medicine

## 2018-12-12 DIAGNOSIS — M7542 Impingement syndrome of left shoulder: Secondary | ICD-10-CM | POA: Diagnosis present

## 2018-12-12 DIAGNOSIS — M25512 Pain in left shoulder: Secondary | ICD-10-CM | POA: Insufficient documentation

## 2018-12-12 DIAGNOSIS — G8929 Other chronic pain: Secondary | ICD-10-CM | POA: Diagnosis present

## 2018-12-17 ENCOUNTER — Other Ambulatory Visit: Payer: Self-pay

## 2018-12-17 ENCOUNTER — Ambulatory Visit
Payer: Medicare Other | Attending: Student in an Organized Health Care Education/Training Program | Admitting: Student in an Organized Health Care Education/Training Program

## 2018-12-17 DIAGNOSIS — M5412 Radiculopathy, cervical region: Secondary | ICD-10-CM | POA: Diagnosis not present

## 2018-12-17 DIAGNOSIS — M48062 Spinal stenosis, lumbar region with neurogenic claudication: Secondary | ICD-10-CM

## 2018-12-17 DIAGNOSIS — M5030 Other cervical disc degeneration, unspecified cervical region: Secondary | ICD-10-CM | POA: Diagnosis not present

## 2018-12-17 DIAGNOSIS — G8929 Other chronic pain: Secondary | ICD-10-CM

## 2018-12-17 DIAGNOSIS — M4692 Unspecified inflammatory spondylopathy, cervical region: Secondary | ICD-10-CM | POA: Diagnosis not present

## 2018-12-17 DIAGNOSIS — M47812 Spondylosis without myelopathy or radiculopathy, cervical region: Secondary | ICD-10-CM | POA: Diagnosis not present

## 2018-12-17 DIAGNOSIS — M542 Cervicalgia: Secondary | ICD-10-CM

## 2018-12-17 DIAGNOSIS — M51369 Other intervertebral disc degeneration, lumbar region without mention of lumbar back pain or lower extremity pain: Secondary | ICD-10-CM

## 2018-12-17 DIAGNOSIS — M4856XS Collapsed vertebra, not elsewhere classified, lumbar region, sequela of fracture: Secondary | ICD-10-CM

## 2018-12-17 DIAGNOSIS — M961 Postlaminectomy syndrome, not elsewhere classified: Secondary | ICD-10-CM

## 2018-12-17 DIAGNOSIS — M5136 Other intervertebral disc degeneration, lumbar region: Secondary | ICD-10-CM

## 2018-12-17 DIAGNOSIS — G894 Chronic pain syndrome: Secondary | ICD-10-CM

## 2018-12-17 DIAGNOSIS — M5416 Radiculopathy, lumbar region: Secondary | ICD-10-CM

## 2018-12-17 DIAGNOSIS — M4302 Spondylolysis, cervical region: Secondary | ICD-10-CM

## 2018-12-17 MED ORDER — HYDROCODONE-ACETAMINOPHEN 7.5-325 MG PO TABS: 1.0000 | ORAL_TABLET | Freq: Two times a day (BID) | ORAL | 0 refills | Status: DC | PRN

## 2018-12-17 MED ORDER — TIZANIDINE HCL 4 MG PO TABS: 4.0000 mg | ORAL_TABLET | Freq: Two times a day (BID) | ORAL | 2 refills | Status: DC | PRN

## 2018-12-17 NOTE — Progress Notes (Signed)
Pain Management Virtual Encounter Note - Virtual Visit via [facility] (real-time audio visits between healthcare provider and patient).  Patient's Phone No. & Preferred Pharmacy:  [phone] (home); [phone] (mobile); (Preferred) [phone] No e-mail address on record  [place] [place] [address] [place] [address] Phone: [phone] Fax: [phone]  [id number] [id number]: [phone]: [phone]  Pre-screening note:  Our staff contacted Tammy Lamb and offered her an "in person", "face-to-face" appointment versus a telephone encounter. She indicated preferring the telephone encounter, at this time.  Reason for Virtual Visit: COVID-19*  Social distancing based on CDC and AMA recommendations.   I contacted Tammy Lamb on 12/17/2018 at 9:52 AM via video conference.      I clearly identified myself as BGillis Santa MD. I verified that I was speaking with the correct person using two identifiers (Name and date of birth: [id number].  Advanced Informed Consent I sought verbal advanced consent from SSteilacoomfor virtual visit interactions. I informed Tammy Lamb of possible security and privacy concerns, risks, and limitations associated with providing "not-in-person" medical evaluation and management services. I also informed Tammy Lamb of the availability of "in-person" appointments. Finally, I informed her that there would be a charge for the virtual visit and that she could be  personally, fully or partially, financially responsible for it. Ms. PLessigexpressed understanding and agreed to proceed.   Historic Elements   Tammy Lamb is a 68y.o. year old, female patient evaluated today after her last encounter by our practice on 12/09/2018. Tammy Lamb has a past medical history of Anxiety,  Arthritis, Back pain, Colitis, ulcerative ([place], COPD (chronic obstructive pulmonary disease) ([place], Depression, GERD (gastroesophageal reflux disease), Headache, Hypertension, Motion sickness, Stroke (HH[place] (2015), TIA (transient ischemic attack) (2015), Urinary frequency, and Wears dentures. She also  has a past surgical history that includes Tonsillectomy; Cholecystectomy; Abdominal hysterectomy; Diagnostic laparoscopy; Bladder suspension; Colonoscopy; Upper gi endoscopy; Knee arthroscopy (Left, 02/13/2014); Ankle arthroscopy (Left, 03/22/2016); Tendon repair (Left, 03/22/2016); and Lumbar laminectomy/decompression microdiscectomy (N/A, 05/30/2017). Ms. PCharnleyhas a current medication list which includes the following prescription(s): acetaminophen, aspirin, atorvastatin, carvedilol, vitamin d3, cinnamon, citalopram, diazepam, gabapentin, hydrochlorothiazide, hydrocodone-acetaminophen, meclizine, melatonin, omeprazole-sodium bicarbonate, phenylephrine, ramipril, sulfasalazine, tizanidine, and hydrocodone-acetaminophen. She  reports that she has been smoking. She has a 49.00 pack-year smoking history. She has never used smokeless tobacco. She reports current alcohol use. She reports that she does not use drugs. Tammy Lamb allergic to sulfa antibiotics.   HPI  I last communicated with her on 11/16/2018. Today, she is being contacted for medication management.  Patient was last visit with me was on 10/01/2018. At that time patient was endorsing worsening neck pain with radiation to bilateral hands as well as weakness.  MRI of her cervical spine was obtained, results of which are below.  In the interim, patient has been seen by Dr. YCari Carawaywith neurosurgery.  Dr. 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  He [facility]  [facility]opedic surgery for her left shoulder.  Patient did see Dr. KThomasene Lotand received a left shoulder steroid glenohumeral joint injection which she did not find beneficial  for her left shoulder pain.  She also had a left shoulder MRI performed results of which are below.  Patient states that she is still awaiting to be contacted by [facility] regarding left CT-guided C1-C2 facet injection  Pharmacotherapy Assessment   12/09/2018  1   10/01/2018  Hydrocodone-Acetamin 7.5-325  60.00 30 Bi Lat   [id number]   Nor ([id number])   0  15.00 MME  Private Pay   [place]     Monitoring: Pharmacotherapy: No side-effects or adverse reactions reported. [place] PMP: PDMP reviewed during this encounter.       Compliance: No problems identified. Effectiveness: Clinically acceptable. Plan: Refer to "POC".  Review of recent tests   Pertinent Labs  Renal Function Lab Results  Component Value Date   BUN 11 09/20/2018   CREATININE 0.70 09/20/2018   GFRAA >60 09/20/2018   GFRNONAA >60 09/20/2018   Hepatic Function No results found for: AST, ALT, ALBUMIN UDS Summary  Date Value Ref Range Status  09/27/2017 FINAL  Final    Comment:    ==================================================================== TOXASSURE COMP DRUG ANALYSIS,UR ==================================================================== Test                             Result       Flag       Units Drug Present and Declared for Prescription Verification   Noroxycodone                   193          EXPECTED   ng/mg creat    Noroxycodone is an expected metabolite of oxycodone. Sources of    oxycodone include scheduled prescription medications.   Citalopram                     PRESENT      EXPECTED   Desmethylcitalopram            PRESENT      EXPECTED    Desmethylcitalopram is an expected metabolite of citalopram or    the enantiomeric form, escitalopram.   Acetaminophen                  PRESENT      EXPECTED Drug Present not Declared for Prescription  Verification   Salicylate                     PRESENT      UNEXPECTED Drug Absent but Declared for Prescription Verification   Oxycodone                      Not Detected UNEXPECTED ng/mg creat    Oxycodone is almost always present in patients taking this drug    consistently.  Absence of oxycodone could be due to lapse of time    since the last dose or unusual pharmacokinetics (rapid    metabolism).   Gabapentin                     Not Detected UNEXPECTED   Methocarbamol                  Not Detected UNEXPECTED ==================================================================== Test                      Result    Flag   Units      Ref Range   Creatinine  30               mg/dL      >=20 ==================================================================== Declared Medications:  The flagging and interpretation on this report are based on the  following declared medications.  Unexpected results may arise from  inaccuracies in the declared medications.  **Note: The testing scope of this panel includes these medications:  Citalopram (Celexa)  Gabapentin  Methocarbamol (Robaxin)  Oxycodone  **Note: The testing scope of this panel does not include small to  moderate amounts of these reported medications:  Acetaminophen  **Note: The testing scope of this panel does not include following  reported medications:  Atorvastatin (Lipitor)  Carvedilol (Coreg)  Hydrochlorothiazide (Hydrodiuril)  Melatonin  Omeprazole (Zegerid)  Phenylephrine  Ramipril (Altace)  Sodium Bicarbonate (Zegerid)  Sulfasalazine (Azulfidine)  Vitamin D3 ==================================================================== For clinical consultation, please call [phone]. ====================================================================    Note: Above Lab results reviewed. MR SHOULDER LEFT WO CONTRAST CLINICAL DATA:  Chronic left shoulder pain.  EXAM: MRI OF THE LEFT SHOULDER WITHOUT  CONTRAST  TECHNIQUE: Multiplanar, multisequence MR imaging of the shoulder was performed. No intravenous contrast was administered.  COMPARISON:  None.  FINDINGS: Rotator cuff: Mild supraspinatus tendinosis with articular surface fraying of the distal tendon. Mild infraspinatus and subscapularis tendinosis. The teres minor tendon is unremarkable.  Muscles: No atrophy or abnormal signal of the muscles of the rotator cuff.  Biceps long head:  Intact and normally positioned.  Acromioclavicular Joint: Mild arthropathy of the acromioclavicular joint. Type I acromion. No subacromial/subdeltoid bursal fluid.  Glenohumeral Joint: Diffuse cartilage thinning with more focal areas of high-grade cartilage loss over the central glenoid and superior humeral head. No joint effusion.  Labrum: Grossly intact, but evaluation is limited by lack of intraarticular fluid.  Bones:  No marrow abnormality, fracture or dislocation.  Other: None.  IMPRESSION: 1. Mild rotator cuff tendinosis without high-grade tendon tear. 2. Mild acromioclavicular and glenohumeral osteoarthritis.  Electronically Signed   By: Titus Dubin M.D.   On: 12/12/2018 13:40  CLINICAL DATA:  Neck pain and spasms radiating into both arms for 3 months. No known injury.  EXAM: MRI CERVICAL SPINE WITHOUT CONTRAST  TECHNIQUE: Multiplanar, multisequence MR imaging of the cervical spine was performed. No intravenous contrast was administered.  COMPARISON:  None.  FINDINGS: Alignment: Maintained.  Vertebrae: Marrow edema is seen in the dens, anterior arch of C1 and anterior aspect of the right and left lateral masses of C1. Hemangiomas are seen in C5 and C7.  Cord: Normal signal throughout.  Posterior Fossa, vertebral arteries, paraspinal tissues: Negative.  Disc levels:  C2-3: Shallow central protrusion without stenosis.  C3-4: There is loss of disc space height, a disc osteophyte  complex eccentric to the right, ligamentum flavum thickening and bilateral uncovertebral disease. The cord is deformed, worse on the right. Severe bilateral foraminal narrowing is also worse on the right.  C4-5: Shallow disc bulge and mild uncovertebral disease. The central canal and right foramen are open. Mild left foraminal narrowing noted.  C5-6: The central canal and right foramen are open. Mild left foraminal narrowing due to uncovertebral disease.  C6-7: Shallow disc bulge to the left and uncovertebral disease on the left. The ventral thecal sac is narrowed but not effaced. Moderate to moderately severe left and mild right foraminal narrowing is present.  C7-T1: Negative.  IMPRESSION: Edema in the dens, anterior arch of C1 and anterior aspects of the right and left C2 lateral masses could be due to degenerative disease, crystal  arthropathy such as CPPD or inflammatory arthritis including the rheumatoid.  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  Mild left foraminal narrowing C4-5 and C5-6 due to uncovertebral disease.  Moderate to moderately severe left and mild right foraminal narrowing at C6-7 due to uncovertebral disease. Is also within the differential.   Electronically Signed   By: Inge Rise M.D.   On: 10/08/2018 13:06   Assessment  The primary encounter diagnosis was Chronic radicular cervical pain. 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  Encourage patient to follow-up at [facility] regarding CT-guided C1-C2 facet joint injection.  Patient has tried cervical epidural steroid injection as well as cervical facet medial branch nerve blocks at C4, C5, C6, C7 bilaterally on 10/01/2018 without significant benefit with associated temporary increase in dizziness or vertigo.  In regards to her shoulder pain, this is related to mild rotator cuff arthropathy and glenohumeral joint arthropathy.  She is status post left shoulder steroid joint injection which was not helpful for her shoulder pain.  Future considerations could include left suprascapular nerve block.  Regards to medication management, I will refill the patient's tizanidine and hydrocodone as below.  Recommend follow-up in 2 months.  Plan of Care  I am having Tammy Lamb start on HYDROcodone-acetaminophen. I am also having her maintain her citalopram, atorvastatin, hydrochlorothiazide, ramipril, carvedilol, sulfaSALAzine, Vitamin D3, CINNAMON PO, Omeprazole-Sodium Bicarbonate, acetaminophen, Melatonin, phenylephrine, aspirin, meclizine, diazepam, gabapentin, tiZANidine, and HYDROcodone-acetaminophen.  Pharmacotherapy (Medications Ordered): Meds ordered this encounter  Medications  . tiZANidine (ZANAFLEX) 4 MG tablet    Sig: Take 1 tablet (4 mg total) by mouth 2 (two) times daily as needed for up to 30 days for muscle spasms.    Dispense:  60 tablet    Refill:  2    Do not place this medication, or any other prescription from our practice, on "Automatic Refill". Patient may have prescription filled one day early if pharmacy is closed on scheduled refill date.  Marland Kitchen HYDROcodone-acetaminophen (NORCO) 7.5-325 MG tablet    Sig: Take 1 tablet by mouth every 12 (twelve) hours as needed for up to 30 days for severe pain. Must last 30 days.    Dispense:  60 tablet    Refill:  0    [place] STOP ACT - Not applicable. Fill one day early if pharmacy is closed on scheduled refill date.  Marland Kitchen  HYDROcodone-acetaminophen (NORCO) 7.5-325 MG tablet    Sig: Take 1 tablet by mouth every 12 (twelve) hours as needed for up to 30 days for severe pain. Must last 30 days.    Dispense:  60 tablet    Refill:  0    [place] STOP ACT - Not applicable. Fill one day early if pharmacy is closed on scheduled refill date.   Orders:  No orders of the defined types were placed in this encounter.  Follow-up plan:   Return in about 11 weeks (around 03/04/2019) for Medication Management.    I discussed the assessment and treatment plan with the patient. The patient was provided an opportunity to ask questions and all were answered. The patient agreed with the  plan and demonstrated an understanding of the instructions.  Patient advised to call back or seek an in-person evaluation if the symptoms or condition worsens.  Total duration of non-face-to-face encounter: 25 minutes.  Note by: Tammy Santa, MD Date: 12/17/2018; Time: 9:52 AM  Note: This dictation was prepared with Dragon dictation. Any transcriptional errors that may result from this process are unintentional.  Disclaimer:  * Given the special circumstances of the COVID-19 pandemic, the federal government has announced that the Office for Civil Rights (OCR) will exercise its enforcement discretion and will not impose penalties on physicians using telehealth in the event of noncompliance with regulatory requirements under the [place] and [place] (HIPAA) in connection with the good faith provision of telehealth during the BMSXJ-15 national public health emergency. ([place])

## 2019-01-30 ENCOUNTER — Other Ambulatory Visit: Payer: Self-pay | Admitting: Student in an Organized Health Care Education/Training Program

## 2019-02-28 ENCOUNTER — Other Ambulatory Visit: Payer: Self-pay | Admitting: Neurosurgery

## 2019-03-03 ENCOUNTER — Encounter: Payer: Self-pay | Admitting: Student in an Organized Health Care Education/Training Program

## 2019-03-04 ENCOUNTER — Ambulatory Visit
Payer: Medicare Other | Attending: Student in an Organized Health Care Education/Training Program | Admitting: Student in an Organized Health Care Education/Training Program

## 2019-03-04 ENCOUNTER — Encounter: Payer: Self-pay | Admitting: Student in an Organized Health Care Education/Training Program

## 2019-03-04 ENCOUNTER — Other Ambulatory Visit: Payer: Self-pay

## 2019-03-04 DIAGNOSIS — M47812 Spondylosis without myelopathy or radiculopathy, cervical region: Secondary | ICD-10-CM | POA: Diagnosis not present

## 2019-03-04 DIAGNOSIS — M542 Cervicalgia: Secondary | ICD-10-CM

## 2019-03-04 DIAGNOSIS — M5412 Radiculopathy, cervical region: Secondary | ICD-10-CM | POA: Diagnosis not present

## 2019-03-04 DIAGNOSIS — M5030 Other cervical disc degeneration, unspecified cervical region: Secondary | ICD-10-CM | POA: Diagnosis not present

## 2019-03-04 DIAGNOSIS — M4692 Unspecified inflammatory spondylopathy, cervical region: Secondary | ICD-10-CM | POA: Diagnosis not present

## 2019-03-04 DIAGNOSIS — G8929 Other chronic pain: Secondary | ICD-10-CM

## 2019-03-04 DIAGNOSIS — M4302 Spondylolysis, cervical region: Secondary | ICD-10-CM

## 2019-03-04 MED ORDER — HYDROCODONE-ACETAMINOPHEN 7.5-325 MG PO TABS: 1.0000 | ORAL_TABLET | Freq: Two times a day (BID) | ORAL | 0 refills | Status: DC | PRN

## 2019-03-04 NOTE — Progress Notes (Signed)
Pain Management Virtual Encounter Note - Virtual Visit via Telephone Telehealth (real-time audio visits between healthcare provider and patient).   Patient's Phone No. & Preferred Pharmacy:  [phone] (home); [phone] (mobile); (Preferred) [phone] No e-mail address on record  [place], [place] [address] Phone: [phone] Fax: [phone]  [id number] [place]Phone: [phone]: [phone]   Pre-screening note:  Our staff contacted Tammy Lamb and offered her an "in person", "face-to-face" appointment versus a telephone encounter. She indicated preferring the telephone encounter, at this time.   Reason for Virtual Visit: COVID-19*  Social distancing based on CDC and AMA recommendations.   I contacted Tammy Lamb on 03/04/2019 via telephone.      I clearly identified myself as Tammy Santa MD. I verified that I was speaking with the correct person using two identifiers (Name: Tammy Lamb and date of birth: 2Sep 09, 1952.  Advanced Informed Consent I sought verbal advanced consent from Tammy Branchfor virtual visit interactions. I informed Tammy Lamb of possible security and privacy concerns, risks, and limitations associated with providing "not-in-person" medical evaluation and management services. I also informed Tammy Lamb of the availability of "in-person" appointments. Finally, I informed her that there would be a charge for the virtual visit and that she could be  personally, fully or partially, financially responsible for it. Tammy Lamb understanding and agreed to proceed.   Historic Elements   Tammy Lamb is a 68y.o. year old, female patient evaluated today after her last encounter by our practice on 01/30/2019. Tammy Lamb has a past medical history of Anxiety,  Arthritis, Back pain, Colitis, ulcerative ([place], COPD (chronic obstructive pulmonary disease) ([place], Depression, GERD (gastroesophageal reflux disease), Headache, Hypertension, Motion sickness, Stroke ([place] (2015), TIA (transient ischemic attack) (2015), Urinary frequency, and Wears dentures. She also  has a past surgical history that includes Tonsillectomy; Cholecystectomy; Abdominal hysterectomy; Diagnostic laparoscopy; Bladder suspension; Colonoscopy; Upper gi endoscopy; Knee arthroscopy (Left, 02/13/2014); Ankle arthroscopy (Left, 03/22/2016); Tendon repair (Left, 03/22/2016); and Lumbar laminectomy/decompression microdiscectomy (N/A, 05/30/2017). Ms. PMasrihas a current medication list which includes the following prescription(s): acetaminophen, amitriptyline, atorvastatin, vitamin d3, cinnamon, citalopram, gabapentin, hydrochlorothiazide, hydrocodone-acetaminophen, hydrocodone-acetaminophen, melatonin, omeprazole-sodium bicarbonate, oxybutynin, phenylephrine, ramipril, sulfasalazine, tizanidine, ventolin hfa, aspirin, carvedilol, diazepam, and meclizine. She  reports that she has been smoking. She has a 49.00 pack-year smoking history. She has never used smokeless tobacco. She reports current alcohol use. She reports that she does not use drugs. Tammy Lamb allergic to sulfa antibiotics.   HPI  Today, she is being contacted for medication management.   Of [facility] but the patient experienced vagal symptoms and that level was aborted.  Patient did receive a left C4-C5 cervical epidural steroid injection.  She states that the procedure helped for approximately 7 to 10 days with return of her pain thereafter.  She is scheduled for C3-C4 ACDF with Tammy Lamb  I informed her of our clinic policy regarding perioperative opioid prescribing.  She is to continue her opioid medications for her chronic pain as prescribed.  She is on  low-dose twice daily of hydrocodone at 7.5 mg.  Pharmacotherapy Assessment  Analgesic:   02/07/2019  1   12/17/2018  Hydrocodone-Acetamin 7.5-325  60.00 30 Bi Lat  [id number]   Nor (2703)   0  15.00 MME  Private Pay   [place]       Monitoring: Pharmacotherapy: No side-effects or adverse reactions reported. [place] PMP: PDMP reviewed during this encounter.       Compliance: No problems identified. Effectiveness: Clinically acceptable. Plan: Refer to "POC".  UDS:  Summary  Date Value Ref Range Status  09/27/2017 FINAL  Final    Comment:    ==================================================================== TOXASSURE COMP DRUG ANALYSIS,UR ==================================================================== Test                             Result       Flag       Units Drug Present and Declared for Prescription Verification   Noroxycodone                   193          EXPECTED   ng/mg creat    Noroxycodone is an expected metabolite of oxycodone. Sources of    oxycodone include scheduled prescription medications.   Citalopram                     PRESENT      EXPECTED   Desmethylcitalopram            PRESENT      EXPECTED    Desmethylcitalopram is an expected metabolite of citalopram or    the enantiomeric form, escitalopram.   Acetaminophen                  PRESENT      EXPECTED Drug Present not Declared for Prescription Verification   Salicylate                     PRESENT      UNEXPECTED Drug Absent but Declared for Prescription Verification   Oxycodone                      Not Detected UNEXPECTED ng/mg creat    Oxycodone is almost always present in patients taking this drug    consistently.  Absence of oxycodone could be due to lapse of time    since the last dose or unusual pharmacokinetics (rapid    metabolism).   Gabapentin                     Not Detected UNEXPECTED   Methocarbamol                  Not Detected  UNEXPECTED ==================================================================== Test                      Result    Flag   Units      Ref Range   Creatinine              30               mg/dL      >=20 ==================================================================== Declared Medications:  The flagging and interpretation on this report are based on the  following declared medications.  Unexpected results may arise from  inaccuracies in the declared medications.  **Note: The testing scope of this panel includes these medications:  Citalopram (Celexa)  Gabapentin  Methocarbamol (Robaxin)  Oxycodone  **Note: The testing scope of this panel does not include small to  moderate amounts of these reported medications:  Acetaminophen  **Note: The testing scope of this panel does not include following  reported medications:  Atorvastatin (Lipitor)  Carvedilol (Coreg)  Hydrochlorothiazide (Hydrodiuril)  Melatonin  Omeprazole (Zegerid)  Phenylephrine  Ramipril (Altace)  Sodium Bicarbonate (Zegerid)  Sulfasalazine (Azulfidine)  Vitamin D3 ==================================================================== For clinical consultation, please call [phone]. ====================================================================    Laboratory Chemistry Profile (12 mo)  Renal: 09/20/2018: BUN 11; Creatinine, Ser 0.70  Lab Results  Component Value Date   GFRAA >60 09/20/2018   GFRNONAA >60 09/20/2018   Hepatic: No results found for requested labs within last 8760 hours. No results found for: AST, ALT Other: No results found for requested labs within last 8760 hours. Note: Above Lab results reviewed.  Imaging  Last 90 days:  Mr Shoulder Left Wo Contrast  Result Date: 12/12/2018 CLINICAL DATA:  Chronic left shoulder pain. EXAM: MRI OF THE LEFT SHOULDER WITHOUT CONTRAST TECHNIQUE: Multiplanar, multisequence MR imaging of the shoulder was performed. No intravenous contrast was  administered. COMPARISON:  None. FINDINGS: Rotator cuff: Mild supraspinatus tendinosis with articular surface fraying of the distal tendon. Mild infraspinatus and subscapularis tendinosis. The teres minor tendon is unremarkable. Muscles: No atrophy or abnormal signal of the muscles of the rotator cuff. Biceps long head:  Intact and normally positioned. Acromioclavicular Joint: Mild arthropathy of the acromioclavicular joint. Type I acromion. No subacromial/subdeltoid bursal fluid. Glenohumeral Joint: Diffuse cartilage thinning with more focal areas of high-grade cartilage loss over the central glenoid and superior humeral head. No joint effusion. Labrum: Grossly intact, but evaluation is limited by lack of intraarticular fluid. Bones:  No marrow abnormality, fracture or dislocation. Other: None. IMPRESSION: 1. Mild rotator cuff tendinosis without high-grade tendon tear. 2. Mild acromioclavicular and glenohumeral osteoarthritis. Electronically Signed   By: Titus Dubin M.D.   On: 12/12/2018 13:40    Assessment  The primary encounter diagnosis was Chronic radicular cervical pain. Diagnoses of Inflammatory spondylopathy of cervical region [facility]), Cervical facet joint syndrome, DDD (degenerative disc disease), cervical, Cervical spondylolysis, and Cervicalgia were also pertinent to this visit.  Plan of Care  I have changed Tammy Lamb's HYDROcodone-acetaminophen. I am also having her start on HYDROcodone-acetaminophen. Additionally, I am having her maintain her citalopram, atorvastatin, hydrochlorothiazide, ramipril, carvedilol, sulfaSALAzine, Vitamin D3, CINNAMON PO, Omeprazole-Sodium Bicarbonate, acetaminophen, Melatonin, phenylephrine, aspirin, meclizine, diazepam, gabapentin, Ventolin HFA, tiZANidine, amitriptyline, and oxybutynin.  Continue multimodal pain regimen of tizanidine, gabapentin, amitriptyline as prescribed.  Refill of hydrocodone as below.  Patient will follow-up 1 month after her cervical  spine surgery for medication management. Pharmacotherapy (Medications Ordered): Meds ordered this encounter  Medications  . HYDROcodone-acetaminophen (NORCO) 7.5-325 MG tablet    Sig: Take 1 tablet by mouth every 12 (twelve) hours as needed for severe pain. Must last 30 days.    Dispense:  60 tablet    Refill:  0    [place] STOP ACT - Not applicable. Fill one day early if pharmacy is closed on scheduled refill date.  Marland Kitchen HYDROcodone-acetaminophen (NORCO) 7.5-325 MG tablet    Sig: Take 1 tablet by mouth every 12 (twelve) hours as needed for severe pain. Must last 30 days.    Dispense:  60 tablet    Refill:  0    [place] STOP ACT - Not applicable. Fill one day early if pharmacy is closed on scheduled refill date.   Follow-up plan:   Return in about 8 weeks (around 04/29/2019) for Medication Management.        Recent Visits Date Type Provider Dept  12/17/18  Office Visit Gillis Santa, MD [facility]  Showing recent visits within past 90 days and meeting all other requirements   Today's Visits Date Type Provider Dept  03/04/19 Office Visit Gillis Santa, MD [facility]  Showing today's visits and meeting all other requirements   Future Appointments No visits were found meeting these conditions.  Showing future appointments within next 90 days and meeting all other requirements   I discussed the assessment and treatment plan with the patient. The patient was provided an opportunity to ask questions and all were answered. The patient agreed with the plan and demonstrated an understanding of the instructions.  Patient advised to call back or seek an in-person evaluation if the symptoms or condition worsens.  Total duration of non-face-to-face encounter: 25 minutes.  Note by: Gillis Santa, MD Date: 03/04/2019; Time: 11:14 AM  Note: This dictation was prepared with Dragon dictation. Any transcriptional errors that may result from this process are unintentional.  Disclaimer:   * Given the special circumstances of the COVID-19 pandemic, the federal government has announced that the Office for Civil Rights (OCR) will exercise its enforcement discretion and will not impose penalties on physicians using telehealth in the event of noncompliance with regulatory requirements under the [place] (HIPAA) in connection with the good faith provision of telehealth during the GLOVF-64 national public health emergency. ([place])

## 2019-03-06 ENCOUNTER — Telehealth: Payer: Self-pay | Admitting: Student in an Organized Health Care Education/Training Program

## 2019-03-06 ENCOUNTER — Telehealth: Payer: Self-pay | Admitting: *Deleted

## 2019-03-06 ENCOUNTER — Other Ambulatory Visit: Payer: Self-pay | Admitting: Student in an Organized Health Care Education/Training Program

## 2019-03-06 MED ORDER — GABAPENTIN 300 MG PO CAPS: ORAL_CAPSULE | ORAL | 2 refills | Status: DC

## 2019-03-06 MED ORDER — TIZANIDINE HCL 2 MG PO TABS: 2.0000 mg | ORAL_TABLET | Freq: Three times a day (TID) | ORAL | 2 refills | Status: DC | PRN

## 2019-03-06 NOTE — Progress Notes (Signed)
Requested Prescriptions   Signed Prescriptions Disp Refills  . gabapentin (NEURONTIN) 300 MG capsule 90 capsule 2    Sig: 300 mg day, 600 mg qhs  . tiZANidine (ZANAFLEX) 2 MG tablet 90 tablet 2    Sig: Take 1 tablet (2 mg total) by mouth every 8 (eight) hours as needed for muscle spasms.

## 2019-03-06 NOTE — Telephone Encounter (Signed)
Having increased pain in neck. Requesting increase in dose of Hydrocodone. VV scheduled.

## 2019-03-10 NOTE — Telephone Encounter (Signed)
Patient wanted appt to discuss pain

## 2019-03-11 ENCOUNTER — Encounter: Payer: Self-pay | Admitting: Student in an Organized Health Care Education/Training Program

## 2019-03-12 ENCOUNTER — Other Ambulatory Visit: Payer: Self-pay

## 2019-03-12 ENCOUNTER — Ambulatory Visit
Payer: Medicare Other | Attending: Student in an Organized Health Care Education/Training Program | Admitting: Student in an Organized Health Care Education/Training Program

## 2019-03-12 DIAGNOSIS — G8929 Other chronic pain: Secondary | ICD-10-CM

## 2019-03-12 DIAGNOSIS — M5412 Radiculopathy, cervical region: Secondary | ICD-10-CM

## 2019-03-12 NOTE — Progress Notes (Signed)
Patient unclear as to why she has virtual appt today. I met with patient last week. She is taking her medications as prescribed. No charge for brief phone encounter today.

## 2019-04-02 ENCOUNTER — Other Ambulatory Visit: Payer: Self-pay

## 2019-04-02 ENCOUNTER — Encounter
Admission: RE | Admit: 2019-04-02 | Discharge: 2019-04-02 | Disposition: A | Discharge status: Home / Self Care | Payer: Medicare Other | Source: Ambulatory Visit | Attending: Neurosurgery | Admitting: Neurosurgery

## 2019-04-02 DIAGNOSIS — I10 Essential (primary) hypertension: Secondary | ICD-10-CM | POA: Insufficient documentation

## 2019-04-02 DIAGNOSIS — G8929 Other chronic pain: Secondary | ICD-10-CM | POA: Insufficient documentation

## 2019-04-02 DIAGNOSIS — K219 Gastro-esophageal reflux disease without esophagitis: Secondary | ICD-10-CM | POA: Insufficient documentation

## 2019-04-02 DIAGNOSIS — M5412 Radiculopathy, cervical region: Secondary | ICD-10-CM | POA: Insufficient documentation

## 2019-04-02 DIAGNOSIS — F17210 Nicotine dependence, cigarettes, uncomplicated: Secondary | ICD-10-CM | POA: Insufficient documentation

## 2019-04-02 DIAGNOSIS — J449 Chronic obstructive pulmonary disease, unspecified: Secondary | ICD-10-CM | POA: Insufficient documentation

## 2019-04-02 DIAGNOSIS — Z79899 Other long term (current) drug therapy: Secondary | ICD-10-CM | POA: Diagnosis not present

## 2019-04-02 DIAGNOSIS — Z8673 Personal history of transient ischemic attack (TIA), and cerebral infarction without residual deficits: Secondary | ICD-10-CM | POA: Insufficient documentation

## 2019-04-02 DIAGNOSIS — Z01812 Encounter for preprocedural laboratory examination: Secondary | ICD-10-CM | POA: Insufficient documentation

## 2019-04-02 LAB — BASIC METABOLIC PANEL
Anion gap: 12 (ref 5–15)
BUN: 10 mg/dL (ref 8–23)
CO2: 29 mmol/L (ref 22–32)
Calcium: 9.2 mg/dL (ref 8.9–10.3)
Chloride: 93 mmol/L — ABNORMAL LOW (ref 98–111)
Creatinine, Ser: 0.74 mg/dL (ref 0.44–1.00)
GFR calc Af Amer: >60 mL/min (ref >60)
GFR calc non Af Amer: >60 mL/min (ref >60)
Glucose, Bld: 101 mg/dL — ABNORMAL HIGH (ref 70–99)
Potassium: 3.9 mmol/L (ref 3.5–5.1)
Sodium: 134 mmol/L — ABNORMAL LOW (ref 135–145)

## 2019-04-02 LAB — CBC
HCT: 37.5 % (ref 36.0–46.0)
Hemoglobin: 12.4 g/dL (ref 12.0–15.0)
MCH: 29.2 pg (ref 26.0–34.0)
MCHC: 33.1 g/dL (ref 30.0–36.0)
MCV: 88.4 fL (ref 80.0–100.0)
Platelets: 352 10*3/uL (ref 150–400)
RBC: 4.24 MIL/uL (ref 3.87–5.11)
RDW: 13.2 % (ref 11.5–15.5)
WBC: 5.9 10*3/uL (ref 4.0–10.5)
nRBC: 0 % (ref 0.0–0.2)

## 2019-04-02 LAB — APTT: aPTT: 34 s (ref 24–36)

## 2019-04-02 LAB — PROTIME-INR
INR: 1.1 (ref 0.8–1.2)
Prothrombin Time: 13.8 s (ref 11.4–15.2)

## 2019-04-02 LAB — SURGICAL PCR SCREEN
MRSA, PCR: NEGATIVE
Staphylococcus aureus: NEGATIVE

## 2019-04-02 LAB — TYPE AND SCREEN
ABO/RH(D): O POS
Antibody Screen: NEGATIVE

## 2019-04-02 NOTE — Patient Instructions (Signed)
Your procedure is scheduled on: Wednesday 04/09/19  Report to [place]. To find out your arrival time please call [phone] between 1PM - 3PM on Tuesday 04/08/19.   Remember: Instructions that are not followed completely may result in serious medical risk, up to and including death, or upon the discretion of your surgeon and anesthesiologist your surgery may need to be rescheduled.      _X__ 1. Do not eat food after midnight the night before your procedure.                 No gum chewing or hard candies. You may drink clear liquids up to 2 hours                 before you are scheduled to arrive for your surgery- DO NOT drink clear                 liquids within 2 hours of the start of your surgery.                 Clear Liquids include:  water, apple juice without pulp, clear carbohydrate                 drink such as Clearfast or Gatorade, Black Coffee or Tea (Do not add                 milk or creamer to coffee or tea).    __X__2.  On the morning of surgery brush your teeth with toothpaste and water, you may rinse your mouth with mouthwash if you wish.  Do not swallow any toothpaste or mouthwash.      _X__ 3.  No Alcohol for 24 hours before or after surgery.    _X__ 4.  Do Not Smoke or use e-cigarettes For 24 Hours Prior to Your Surgery.                 Do not use any chewable tobacco products for at least 6 hours prior to                 surgery.    __X__5.  Notify your doctor if there is any change in your medical condition      (cold, fever, infections).      Do not wear jewelry, make-up, hairpins, clips or nail polish. Do not wear lotions, powders, or perfumes.  Do not shave 48 hours prior to surgery. Men may shave face and neck. Do not bring valuables to the hospital.     [facility] is not responsible for any belongings or valuables.  Contacts, dentures/partials or body piercings may not be worn into  surgery. Bring a case for your contacts, glasses or hearing aids, a denture cup will be supplied.  For patients admitted to the hospital, discharge time is determined by your treatment team.   Patients discharged the day of surgery will not be allowed to drive home.    Please read over the following fact sheets that you were given:   MRSA Information   __X__ Take these medicines the morning of surgery with A SIP OF WATER:     1. gabapentin (NEURONTIN) 300 MG capsule  2. Omeprazole-Sodium Bicarbonate (ZEGERID OTC) 20-1100 MG CAPS capsule  3. oxybutynin (DITROPAN-XL) 5 MG 24 hr tablet  4. sulfaSALAzine (AZULFIDINE) 500 MG tablet  5. tiZANidine (ZANAFLEX) 4 MG tablet  6. VENTOLIN HFA 108 (90 Base) MCG/ACT  inhaler  7. HYDROcodone-acetaminophen (NORCO) 7.5-325 MG tablet if needed   __X__ Use CHG Soap as directed    _ X___ Use inhalers on the day of surgery. Also bring the inhaler with you to the hospital on the morning of surgery.    __X__ Stop Anti-inflammatories 7 days before surgery such as Advil, Ibuprofen, Motrin, BC or Goodies Powder, Naprosyn, Naproxen, Aleve, Aspirin, Meloxicam. May take Tylenol if needed for pain or discomfort.    __X__ Stop the following herbal supplements today.  CINNAMON PO

## 2019-04-07 ENCOUNTER — Other Ambulatory Visit
Admission: RE | Admit: 2019-04-07 | Discharge: 2019-04-07 | Disposition: A | Discharge status: Home / Self Care | Payer: Medicare Other | Source: Ambulatory Visit | Attending: Neurosurgery | Admitting: Neurosurgery

## 2019-04-07 ENCOUNTER — Other Ambulatory Visit: Payer: Self-pay

## 2019-04-07 DIAGNOSIS — Z01812 Encounter for preprocedural laboratory examination: Secondary | ICD-10-CM | POA: Diagnosis present

## 2019-04-07 DIAGNOSIS — Z20828 Contact with and (suspected) exposure to other viral communicable diseases: Secondary | ICD-10-CM | POA: Insufficient documentation

## 2019-04-07 LAB — SARS CORONAVIRUS 2 (TAT 6-24 HRS): SARS Coronavirus 2: NEGATIVE

## 2019-04-09 ENCOUNTER — Ambulatory Visit: Payer: Medicare Other | Admitting: Anesthesiology

## 2019-04-09 ENCOUNTER — Other Ambulatory Visit: Payer: Self-pay

## 2019-04-09 ENCOUNTER — Encounter
Admission: RE | Disposition: A | Discharge status: Home / Self Care | Payer: Self-pay | Source: Home / Self Care | Attending: Neurosurgery

## 2019-04-09 ENCOUNTER — Ambulatory Visit
Admission: RE | Admit: 2019-04-09 | Discharge: 2019-04-09 | Disposition: A | Discharge status: Home / Self Care | Payer: Medicare Other | Attending: Neurosurgery | Admitting: Neurosurgery

## 2019-04-09 ENCOUNTER — Ambulatory Visit: Payer: Medicare Other

## 2019-04-09 ENCOUNTER — Encounter: Payer: Self-pay | Admitting: *Deleted

## 2019-04-09 DIAGNOSIS — K219 Gastro-esophageal reflux disease without esophagitis: Secondary | ICD-10-CM | POA: Insufficient documentation

## 2019-04-09 DIAGNOSIS — Z882 Allergy status to sulfonamides status: Secondary | ICD-10-CM | POA: Insufficient documentation

## 2019-04-09 DIAGNOSIS — F329 Major depressive disorder, single episode, unspecified: Secondary | ICD-10-CM | POA: Insufficient documentation

## 2019-04-09 DIAGNOSIS — M1990 Unspecified osteoarthritis, unspecified site: Secondary | ICD-10-CM | POA: Diagnosis not present

## 2019-04-09 DIAGNOSIS — F419 Anxiety disorder, unspecified: Secondary | ICD-10-CM | POA: Insufficient documentation

## 2019-04-09 DIAGNOSIS — I10 Essential (primary) hypertension: Secondary | ICD-10-CM | POA: Insufficient documentation

## 2019-04-09 DIAGNOSIS — Z79899 Other long term (current) drug therapy: Secondary | ICD-10-CM | POA: Insufficient documentation

## 2019-04-09 DIAGNOSIS — Z981 Arthrodesis status: Secondary | ICD-10-CM

## 2019-04-09 DIAGNOSIS — Z419 Encounter for procedure for purposes other than remedying health state, unspecified: Secondary | ICD-10-CM

## 2019-04-09 DIAGNOSIS — E785 Hyperlipidemia, unspecified: Secondary | ICD-10-CM | POA: Insufficient documentation

## 2019-04-09 DIAGNOSIS — M5412 Radiculopathy, cervical region: Secondary | ICD-10-CM | POA: Insufficient documentation

## 2019-04-09 DIAGNOSIS — F17210 Nicotine dependence, cigarettes, uncomplicated: Secondary | ICD-10-CM | POA: Diagnosis not present

## 2019-04-09 HISTORY — PX: ANTERIOR CERVICAL DECOMP/DISCECTOMY FUSION: SHX1161

## 2019-04-09 SURGERY — ANTERIOR CERVICAL DECOMPRESSION/DISCECTOMY FUSION 1 LEVEL
Anesthesia: General

## 2019-04-09 MED ORDER — ONDANSETRON HCL 4 MG/2ML IJ SOLN
INTRAMUSCULAR | Status: DC | PRN
  Administered 2019-04-09: 4 mg via INTRAVENOUS

## 2019-04-09 MED ORDER — ACETAMINOPHEN 10 MG/ML IV SOLN
INTRAVENOUS | Status: DC | PRN
  Administered 2019-04-09: 1000 mg via INTRAVENOUS

## 2019-04-09 MED ORDER — METHOCARBAMOL 500 MG PO TABS
ORAL_TABLET | ORAL | Status: AC
  Filled 2019-04-09: qty 1

## 2019-04-09 MED ORDER — KETAMINE HCL 10 MG/ML IJ SOLN
INTRAMUSCULAR | Status: DC | PRN
  Administered 2019-04-09: 50 mg via INTRAVENOUS

## 2019-04-09 MED ORDER — THROMBIN 5000 UNITS EX SOLR
CUTANEOUS | Status: DC | PRN
  Administered 2019-04-09: 5000 [IU] via TOPICAL

## 2019-04-09 MED ORDER — SODIUM CHLORIDE 0.9 % IR SOLN
Status: DC | PRN
  Administered 2019-04-09: 1000 mL

## 2019-04-09 MED ORDER — PROPOFOL 500 MG/50ML IV EMUL
INTRAVENOUS | Status: AC
  Filled 2019-04-09: qty 50

## 2019-04-09 MED ORDER — TIZANIDINE HCL 4 MG PO TABS: 4.0000 mg | ORAL_TABLET | Freq: Four times a day (QID) | ORAL | 0 refills | Status: DC | PRN

## 2019-04-09 MED ORDER — FENTANYL CITRATE (PF) 100 MCG/2ML IJ SOLN
INTRAMUSCULAR | Status: AC
  Filled 2019-04-09: qty 2

## 2019-04-09 MED ORDER — PROPOFOL 10 MG/ML IV BOLUS
INTRAVENOUS | Status: DC | PRN
  Administered 2019-04-09: 130 mg via INTRAVENOUS

## 2019-04-09 MED ORDER — MIDAZOLAM HCL 2 MG/2ML IJ SOLN
INTRAMUSCULAR | Status: DC | PRN
  Administered 2019-04-09: 2 mg via INTRAVENOUS

## 2019-04-09 MED ORDER — CEFAZOLIN SODIUM-DEXTROSE 2-4 GM/100ML-% IV SOLN
2.0000 g | INTRAVENOUS | Status: AC
  Administered 2019-04-09: 2 g via INTRAVENOUS

## 2019-04-09 MED ORDER — PROPOFOL 10 MG/ML IV BOLUS
INTRAVENOUS | Status: AC
  Filled 2019-04-09: qty 20

## 2019-04-09 MED ORDER — REMIFENTANIL HCL 1 MG IV SOLR
INTRAVENOUS | Status: AC
  Filled 2019-04-09: qty 1000

## 2019-04-09 MED ORDER — DEXAMETHASONE SODIUM PHOSPHATE 10 MG/ML IJ SOLN
INTRAMUSCULAR | Status: DC | PRN
  Administered 2019-04-09: 4 mg via INTRAVENOUS

## 2019-04-09 MED ORDER — MIDAZOLAM HCL 2 MG/2ML IJ SOLN
INTRAMUSCULAR | Status: AC
  Filled 2019-04-09: qty 2

## 2019-04-09 MED ORDER — HYDROCODONE-ACETAMINOPHEN 5-325 MG PO TABS: 1.0000 | ORAL_TABLET | Freq: Four times a day (QID) | ORAL | 0 refills | Status: DC | PRN

## 2019-04-09 MED ORDER — SODIUM CHLORIDE FLUSH 0.9 % IV SOLN
INTRAVENOUS | Status: AC
  Filled 2019-04-09: qty 10

## 2019-04-09 MED ORDER — ACETAMINOPHEN 10 MG/ML IV SOLN
INTRAVENOUS | Status: AC
  Filled 2019-04-09: qty 100

## 2019-04-09 MED ORDER — KETAMINE HCL 50 MG/ML IJ SOLN
INTRAMUSCULAR | Status: AC
  Filled 2019-04-09: qty 10

## 2019-04-09 MED ORDER — BACITRACIN 50000 UNITS IM SOLR
INTRAMUSCULAR | Status: AC
  Filled 2019-04-09: qty 1

## 2019-04-09 MED ORDER — THROMBIN 5000 UNITS EX SOLR
CUTANEOUS | Status: AC
  Filled 2019-04-09: qty 5000

## 2019-04-09 MED ORDER — ONDANSETRON HCL 4 MG/2ML IJ SOLN: 4.0000 mg | Freq: Once | INTRAMUSCULAR | Status: DC | PRN

## 2019-04-09 MED ORDER — METHOCARBAMOL 500 MG PO TABS
500.0000 mg | ORAL_TABLET | Freq: Once | ORAL | Status: AC
  Administered 2019-04-09: 11:00:00 500 mg via ORAL

## 2019-04-09 MED ORDER — FENTANYL CITRATE (PF) 100 MCG/2ML IJ SOLN
INTRAMUSCULAR | Status: DC | PRN
  Administered 2019-04-09: 100 ug via INTRAVENOUS

## 2019-04-09 MED ORDER — SODIUM CHLORIDE 0.9 % IV SOLN
INTRAVENOUS | Status: DC | PRN
  Administered 2019-04-09: 30 ug/min via INTRAVENOUS

## 2019-04-09 MED ORDER — REMIFENTANIL HCL 1 MG IV SOLR
INTRAVENOUS | Status: DC | PRN
  Administered 2019-04-09: .1 ug/kg/min via INTRAVENOUS

## 2019-04-09 MED ORDER — PROPOFOL 500 MG/50ML IV EMUL
INTRAVENOUS | Status: DC | PRN
  Administered 2019-04-09: 130 ug/kg/min via INTRAVENOUS

## 2019-04-09 MED ORDER — FENTANYL CITRATE (PF) 100 MCG/2ML IJ SOLN
25.0000 ug | INTRAMUSCULAR | Status: AC | PRN
  Administered 2019-04-09 (×6): 25 ug via INTRAVENOUS

## 2019-04-09 MED ORDER — BUPIVACAINE-EPINEPHRINE (PF) 0.5% -1:200000 IJ SOLN
INTRAMUSCULAR | Status: DC | PRN
  Administered 2019-04-09: 4 mL

## 2019-04-09 MED ORDER — LACTATED RINGERS IV SOLN
INTRAVENOUS | Status: DC
  Administered 2019-04-09: 08:00:00 via INTRAVENOUS

## 2019-04-09 MED ORDER — BUPIVACAINE-EPINEPHRINE (PF) 0.5% -1:200000 IJ SOLN
INTRAMUSCULAR | Status: AC
  Filled 2019-04-09: qty 30

## 2019-04-09 MED ORDER — EPHEDRINE SULFATE 50 MG/ML IJ SOLN
INTRAMUSCULAR | Status: DC | PRN
  Administered 2019-04-09: 20 mg via INTRAVENOUS
  Administered 2019-04-09: 10 mg via INTRAVENOUS

## 2019-04-09 MED ORDER — LACTATED RINGERS IV SOLN
INTRAVENOUS | Status: DC | PRN
  Administered 2019-04-09: 08:00:00 via INTRAVENOUS

## 2019-04-09 SURGICAL SUPPLY — 58 items
ALLOGRAFT LORDOTIC CC 7X11X14 (Bone Implant) ×2 IMPLANT
BASKET BONE COLLECTION (BASKET) IMPLANT
BULB RESERV EVAC DRAIN JP 100C (MISCELLANEOUS) IMPLANT
BUR NEURO DRILL SOFT 3.0X3.8M (BURR) ×2 IMPLANT
CANISTER SUCT 1200ML W/VALVE (MISCELLANEOUS) ×4 IMPLANT
CHLORAPREP W/TINT 26 (MISCELLANEOUS) ×4 IMPLANT
COUNTER NEEDLE 20/40 LG (NEEDLE) ×2 IMPLANT
COVER LIGHT HANDLE STERIS (MISCELLANEOUS) ×4 IMPLANT
COVER WAND RF STERILE (DRAPES) ×2 IMPLANT
CRADLE LAMINECT ARM (MISCELLANEOUS) ×2 IMPLANT
CUP MEDICINE 2OZ PLAST GRAD ST (MISCELLANEOUS) ×2 IMPLANT
DERMABOND ADVANCED (GAUZE/BANDAGES/DRESSINGS) ×1
DERMABOND ADVANCED .7 DNX12 (GAUZE/BANDAGES/DRESSINGS) ×1 IMPLANT
DRAIN CHANNEL JP 10F RND 20C F (MISCELLANEOUS) IMPLANT
DRAPE C-ARM 42X72 X-RAY (DRAPES) ×4 IMPLANT
DRAPE LAPAROTOMY 77X122 PED (DRAPES) ×2 IMPLANT
DRAPE MICROSCOPE SPINE 48X150 (DRAPES) ×2 IMPLANT
DRAPE POUCH INSTRU U-SHP 10X18 (DRAPES) ×2 IMPLANT
DRAPE SURG 17X11 SM STRL (DRAPES) ×8 IMPLANT
ELECT CAUTERY BLADE TIP 2.5 (TIP) ×2
ELECT REM PT RETURN 9FT ADLT (ELECTROSURGICAL) ×2
ELECTRODE CAUTERY BLDE TIP 2.5 (TIP) ×1 IMPLANT
ELECTRODE REM PT RTRN 9FT ADLT (ELECTROSURGICAL) ×1 IMPLANT
FEE INTRAOP MONITOR IMPULS NCS (MISCELLANEOUS) IMPLANT
FRAME EYE SHIELD (PROTECTIVE WEAR) ×4 IMPLANT
GLOVE BIOGEL PI IND STRL 7.0 (GLOVE) ×1 IMPLANT
GLOVE BIOGEL PI INDICATOR 7.0 (GLOVE) ×1
GLOVE SURG SYN 7.0 (GLOVE) ×4 IMPLANT
GLOVE SURG SYN 8.5  E (GLOVE) ×3
GLOVE SURG SYN 8.5 E (GLOVE) ×3 IMPLANT
GOWN SRG XL LVL 3 NONREINFORCE (GOWNS) ×1 IMPLANT
GOWN STRL NON-REIN TWL XL LVL3 (GOWNS) ×1
GOWN STRL REUS W/TWL MED LVL3 (GOWN DISPOSABLE) ×2 IMPLANT
GRADUATE 1200CC STRL 31836 (MISCELLANEOUS) ×2 IMPLANT
INTRAOP MONITOR FEE IMPULS NCS (MISCELLANEOUS)
INTRAOP MONITOR FEE IMPULSE (MISCELLANEOUS)
KIT TURNOVER KIT A (KITS) ×2 IMPLANT
MARKER SKIN DUAL TIP RULER LAB (MISCELLANEOUS) ×4 IMPLANT
NDL SAFETY ECLIPSE 18X1.5 (NEEDLE) ×1 IMPLANT
NEEDLE HYPO 18GX1.5 SHARP (NEEDLE) ×1
NEEDLE HYPO 22GX1.5 SAFETY (NEEDLE) ×2 IMPLANT
NS IRRIG 1000ML POUR BTL (IV SOLUTION) ×2 IMPLANT
PACK LAMINECTOMY NEURO (CUSTOM PROCEDURE TRAY) ×2 IMPLANT
PIN CASPAR 14 (PIN) ×1 IMPLANT
PIN CASPAR 14MM (PIN) ×2
PLATE ACP 18 1-LEVEL (Plate) ×2 IMPLANT
SCREW ACP 3.5X15 (Screw) ×8 IMPLANT
SPOGE SURGIFLO 8M (HEMOSTASIS) ×1
SPONGE KITTNER 5P (MISCELLANEOUS) ×2 IMPLANT
SPONGE SURGIFLO 8M (HEMOSTASIS) ×1 IMPLANT
STAPLER SKIN PROX 35W (STAPLE) IMPLANT
SUT V-LOC 90 ABS DVC 3-0 CL (SUTURE) ×2 IMPLANT
SUT VIC AB 3-0 SH 8-18 (SUTURE) ×2 IMPLANT
SYR 30ML LL (SYRINGE) ×2 IMPLANT
TAPE CLOTH 3X10 WHT NS LF (GAUZE/BANDAGES/DRESSINGS) ×2 IMPLANT
TOWEL OR 17X26 4PK STRL BLUE (TOWEL DISPOSABLE) ×6 IMPLANT
TRAY FOLEY MTR SLVR 16FR STAT (SET/KITS/TRAYS/PACK) IMPLANT
TUBING CONNECTING 10 (TUBING) ×2 IMPLANT

## 2019-04-09 NOTE — Anesthesia Postprocedure Evaluation (Signed)
Anesthesia Post Note  Patient: Tammy Lamb  Procedure(s) Performed: ANTERIOR CERVICAL DECOMPRESSION/DISCECTOMY FUSION 1 LEVEL C3-4 (N/A )  Patient location during evaluation: PACU Anesthesia Type: General Level of consciousness: awake and alert Pain management: pain level controlled Vital Signs Assessment: post-procedure vital signs reviewed and stable Respiratory status: spontaneous breathing and respiratory function stable Cardiovascular status: stable Anesthetic complications: no     Last Vitals:  Vitals:   04/09/19 1130 04/09/19 1141  BP: 114/63   Pulse: 82 94  Resp: 18   Temp:    SpO2: 94% 91%    Last Pain:  Vitals:   04/09/19 1141  TempSrc:   PainSc: 7                  Katie Moch K

## 2019-04-09 NOTE — Transfer of Care (Signed)
Immediate Anesthesia Transfer of Care Note  Patient: Tammy Lamb  Procedure(s) Performed: ANTERIOR CERVICAL DECOMPRESSION/DISCECTOMY FUSION 1 LEVEL C3-4 (N/A )  Patient Location: PACU  Anesthesia Type:General  Level of Consciousness: awake, alert  and oriented  Airway & Oxygen Therapy: Patient Spontanous Breathing and Patient connected to face mask oxygen  Post-op Assessment: Report given to RN and Post -op Vital signs reviewed and stable  Post vital signs: Reviewed and stable  Last Vitals:  Vitals Value Taken Time  BP 132/71 04/09/19 1011  Temp 36.1 C 04/09/19 1011  Pulse 95 04/09/19 1012  Resp 12 04/09/19 1012  SpO2 98 % 04/09/19 1012  Vitals shown include unvalidated device data.  Last Pain:  Vitals:   04/09/19 1011  TempSrc:   PainSc: 0-No pain         Complications: No apparent anesthesia complications

## 2019-04-09 NOTE — Progress Notes (Signed)
Patient urinated and walked prior to leaving and she feels okay to leave and is ready. I called Dr. Izora Ribas and he stated he did not need to see her or the x rays. Patient will be transported to radiology for x rays and then she can leave.

## 2019-04-09 NOTE — Progress Notes (Signed)
MEDICATION RELATED CONSULT NOTE - INITIAL   Pharmacy Consult for Ancef Indication: surgical prophylaxis  Allergies  Allergen Reactions  . Sulfa Antibiotics Nausea And Vomiting    Patient Measurements:   Weight: 98Kg  Estimated Creatinine Clearance: 78.3 mL/min (by C-G formula based on SCr of 0.74 mg/dL).   Recent Results (from the past 720 hour(s))  Surgical pcr screen     Status: None   Collection Time: 04/02/19 11:55 AM   Specimen: Nasal Mucosa; Nasal Swab  Result Value Ref Range Status   MRSA, PCR NEGATIVE NEGATIVE Final   Staphylococcus aureus NEGATIVE NEGATIVE Final    Comment: (NOTE) The Xpert SA Assay (FDA approved for NASAL specimens in patients 66 years of age and older), is one component of a comprehensive surveillance program. It is not intended to diagnose infection nor to guide or monitor treatment. Performed at [facility], [place], [place] [address]   SARS CORONAVIRUS 2 (TAT 6-24 HRS) Nasopharyngeal Nasopharyngeal Swab     Status: None   Collection Time: 04/07/19  8:11 AM   Specimen: Nasopharyngeal Swab  Result Value Ref Range Status   SARS Coronavirus 2 NEGATIVE NEGATIVE Final    Comment: (NOTE) SARS-CoV-2 target nucleic acids are NOT DETECTED. The SARS-CoV-2 RNA is generally detectable in upper and lower respiratory specimens during the acute phase of infection. Negative results do not preclude SARS-CoV-2 infection, do not rule out co-infections with other pathogens, and should not be used as the sole basis for treatment or other patient management decisions. Negative results must be combined with clinical observations, patient history, and epidemiological information. The expected result is Negative. Fact Sheet for Patients: SugarRoll.be Fact Sheet for Healthcare Providers: https://www.woods-mathews.com/ This test is not yet approved or cleared by the [place] FDA and  has  been authorized for detection and/or diagnosis of SARS-CoV-2 by FDA under an Emergency Use Authorization (EUA). This EUA will remain  in effect (meaning this test can be used) for the duration of the COVID-19 declaration under Section 56 4(b)(1) of the Act, 21 U.S.C. section 360bbb-3(b)(1), unless the authorization is terminated or revoked sooner. Performed at [facility], [place] [address] [place], [place] [address]    Plan:  Ancef 2gm pre-op  Hart Robinsons A 04/09/2019,6:52 AM

## 2019-04-09 NOTE — Discharge Summary (Signed)
Procedure: C3-4 ACDF Procedure date: 04/09/2019 Diagnosis: Cervical  radiculopathy  History: Tyeasha D Nicolosi is s/p C3-4 ACDF for cervical radiculopathy  POD0: Tolerated procedure well without complication. Evaluated in post op recovery still disoriented from anesthesia but able to answer questions and obey commands. Complains of posterior neck pain. Denies arm pain that was present prior to surgery. Denies any new numbness/tingling in upper extremities.   Physical Exam: Vitals:   04/09/19 0706  BP: (!) 125/102  Pulse: 79  Resp: 16  Temp: (!) 96.4 F (35.8 C)  SpO2: 95%   Strength:5/5 throughout upper and lower extremities.  Sensation: intact and symmetric throughout upper and lower extremities.  Skin: Glue intact at incision site. No significant swelling noted.   Data:  Recent Labs  Lab 04/02/19 1155  NA 134*  K 3.9  CL 93*  CO2 29  BUN 10  CREATININE 0.74  GLUCOSE 101*  CALCIUM 9.2   No results for input(s): AST, ALT, ALKPHOS in the last 168 hours.  Invalid input(s): TBILI   Recent Labs  Lab 04/02/19 1155  WBC 5.9  HGB 12.4  HCT 37.5  PLT 352   Recent Labs  Lab 04/02/19 1155  APTT 34  INR 1.1         Other tests/results: Cervical xray pending  Assessment/Plan:  Elowyn D Lamm is POD0 s/p C3-4 ACDF for cervical radiculopathy. Pain that was present prior to surgery has resolved. Post-op imaging completed, no unexpected findings noted. Swallowing without issue.  She is scheduled to follow up in approximately 2 weeks to monitor progress.   Marin Olp PA-C Department of Neurosurgery

## 2019-04-09 NOTE — Anesthesia Post-op Follow-up Note (Signed)
Anesthesia QCDR form completed.        

## 2019-04-09 NOTE — Anesthesia Preprocedure Evaluation (Addendum)
Anesthesia Evaluation  Patient identified by MRN, date of birth, ID band Patient awake    Reviewed: Allergy & Precautions, NPO status , Patient's Chart, lab work & pertinent test results  History of Anesthesia Complications Negative for: history of anesthetic complications  Airway Mallampati: III       Dental   Pulmonary neg sleep apnea, COPD,  COPD inhaler, Current Smoker and Patient abstained from smoking.,           Cardiovascular hypertension, Pt. on medications (-) Past MI and (-) CHF (-) dysrhythmias (-) Valvular Problems/Murmurs     Neuro/Psych Anxiety Depression TIACVA    GI/Hepatic Neg liver ROS, PUD, GERD  Medicated and Controlled,  Endo/Other  neg diabetes  Renal/GU negative Renal ROS     Musculoskeletal   Abdominal   Peds  Hematology   Anesthesia Other Findings   Reproductive/Obstetrics                            Anesthesia Physical Anesthesia Plan  ASA: III  Anesthesia Plan: General   Post-op Pain Management:    Induction: Intravenous  PONV Risk Score and Plan: 2 and Dexamethasone and Ondansetron  Airway Management Planned: Oral ETT  Additional Equipment:   Intra-op Plan:   Post-operative Plan:   Informed Consent: I have reviewed the patients History and Physical, chart, labs and discussed the procedure including the risks, benefits and alternatives for the proposed anesthesia with the patient or authorized representative who has indicated his/her understanding and acceptance.       Plan Discussed with:   Anesthesia Plan Comments:         Anesthesia Quick Evaluation

## 2019-04-09 NOTE — Discharge Instructions (Addendum)
Your surgeon has performed an operation on your cervical spine (neck) to relieve pressure on the spinal cord and/or nerves. This involved making an incision in the front of your neck and removing one or more of the discs that support your spine. Next, a small piece of bone, a titanium plate, and screws were used to fuse two or more of the vertebrae (bones) together.  The following are instructions to help in your recovery once you have been discharged from the hospital. Even if you feel well, it is important that you follow these activity guidelines. If you do not let your neck heal properly from the surgery, you can increase the chance of return of your symptoms and other complications.  * Do not take anti-inflammatory medications for 3 months after surgery (naproxen [Aleve], ibuprofen [Advil, Motrin], celecoxib [Celebrex], etc.). These medications can prevent your bones from healing properly.  Activity    No bending, lifting, or twisting (BLT). Avoid lifting objects heavier than 10 pounds (gallon milk jug).  Where possible, avoid household activities that involve lifting, bending, reaching, pushing, or pulling such as laundry, vacuuming, grocery shopping, and childcare. Try to arrange for help from friends and family for these activities while your back heals.  Increase physical activity slowly as tolerated.  Taking short walks is encouraged, but avoid strenuous exercise. Do not jog, run, bicycle, lift weights, or participate in any other exercises unless specifically allowed by your doctor.  Talk to your doctor before resuming sexual activity.  You should not drive until cleared by your doctor.  Until released by your doctor, you should not return to work or school.  You should rest at home and let your body heal.   You may shower three days after your surgery.  After showering, lightly dab your incision dry. Do not take a tub bath or go swimming until approved by your doctor at your  follow-up appointment.  If your doctor ordered a cervical collar (neck brace) for you, you should wear it whenever you are out of bed. You may remove it when lying down or sleeping, but you should wear it at all other times. Not all neck surgeries require a cervical collar.  If you smoke, we strongly recommend that you quit.  Smoking has been proven to interfere with normal bone healing and will dramatically reduce the success rate of your surgery. Please contact [facility] (800-QUIT-NOW) and use the resources at www.[facility].com for assistance in stopping smoking.  Surgical Incision   If you have a dressing on your incision, you may remove it two days after your surgery. Keep your incision area clean and dry.  If you have staples or stitches on your incision, you should have a follow up scheduled for removal. If you do not have staples or stitches, you will have steri-strips (small pieces of surgical tape) or Dermabond glue. The steri-strips/glue should begin to peel away within about a week (it is fine if the steri-strips fall off before then). If the strips are still in place one week after your surgery, you may gently remove them.  Diet           You may return to your usual diet. However, you may experience discomfort when swallowing in the first month after your surgery. This is normal. You may find that softer foods are more comfortable for you to swallow. Be sure to stay hydrated.  When to Contact us  You may experience pain in your neck and/or pain between your shoulder  blades. This is normal and should improve in the next few weeks with the help of pain medication, muscle relaxers, and rest. Some patients report that a warm compress on the back of the neck or between the shoulder blades helps.  However, should you experience any of the following, contact us immediately:  New numbness or weakness  Pain that is progressively getting worse, and is not relieved by your pain  medication, muscle relaxers, rest, and warm compresses  Bleeding, redness, swelling, pain, or drainage from surgical incision  Chills or flu-like symptoms  Fever greater than 101.0 F (38.3 C)  Inability to eat, drink fluids, or take medications  Problems with bowel or bladder functions  Difficulty breathing or shortness of breath  Warmth, tenderness, or swelling in your calf Contact Information  During office hours (Monday-Friday 9 am to 5 pm), please call your physician at [phone] and ask for Berdine Addison  After hours and weekends, please call the [place] Operator at [phone] and ask for the Neurosurgery Resident On Call   For a life-threatening emergency, call [place]   1) The drugs that you were given will stay in your system until tomorrow so for the next 24 hours you should not:  A) Drive an automobile B) Make any legal decisions C) Drink any alcoholic beverage   2) You may resume regular meals tomorrow.  Today it is better to start with liquids and gradually work up to solid foods.  You may eat anything you prefer, but it is better to start with liquids, then soup and crackers, and gradually work up to solid foods.   3) Please notify your doctor immediately if you have any unusual bleeding, trouble breathing, redness and pain at the surgery site, drainage, fever, or pain not relieved by medication.    4) Additional Instructions:        Please contact your physician with any problems or Same Day Surgery at [phone], Monday through Friday 6 am to 4 pm, or [place] at [facility] number at [phone].

## 2019-04-09 NOTE — Anesthesia Procedure Notes (Addendum)
Procedure Name: Intubation Date/Time: 04/09/2019 8:36 AM Performed by: Cory Munch, RN Pre-anesthesia Checklist: Patient identified, Emergency Drugs available, Suction available and Patient being monitored Patient Re-evaluated:Patient Re-evaluated prior to induction Oxygen Delivery Method: Circle system utilized Preoxygenation: Pre-oxygenation with 100% oxygen Induction Type: IV induction Ventilation: Mask ventilation without difficulty Laryngoscope Size: McGraph and 3 Grade View: Grade I Tube type: Oral Tube size: 7.0 mm Number of attempts: 1 Airway Equipment and Method: Stylet and Oral airway Placement Confirmation: ETT inserted through vocal cords under direct vision,  positive ETCO2 and breath sounds checked- equal and bilateral Secured at: 21 cm Tube secured with: Tape Dental Injury: Teeth and Oropharynx as per pre-operative assessment

## 2019-04-09 NOTE — Op Note (Signed)
Indications: [name] is a 68 yo female with cervical radiculopathy.  She failed conservative management and elected for surgical intervention.  Findings: cervical stenosis  Preoperative Diagnosis: Cervical radiculopathy Postoperative Diagnosis: same   EBL: 50 ml IVF: 800 ml Drains: none Disposition: Extubated and Stable to PACU Complications: none  No foley catheter was placed.   Preoperative Note:   Risks of surgery discussed include: infection, bleeding, stroke, coma, death, paralysis, CSF leak, nerve/spinal cord injury, numbness, tingling, weakness, complex regional pain syndrome, recurrent stenosis and/or disc herniation, vascular injury, development of instability, neck/back pain, need for further surgery, persistent symptoms, development of deformity, and the risks of anesthesia. The patient understood these risks and agreed to proceed.  Procedure:  1) Anterior cervical diskectomy and fusion at C3-4 2) Anterior cervical instrumentation at C3-4 using Nuvasive ACP instrumentation 3) Structural allograft consisting of corticocancellous allograft   Procedure: After obtaining informed consent, the patient taken to the operating room, placed in supine position, general anesthesia induced.  The patient had a small shoulder roll placed behind their shoulders.  The patient received preop antibiotics and IV Decadron.  The patient had a neck incision outlined, was prepped and draped in usual sterile fashion. The incision was injected with local anesthetic.   An incision was opened, dissection taken down medial to the carotid artery and jugular vein, lateral to the trachea and esophagus.  The prevertebral fascia identified and a localizing x-ray demonstrated the correct level.  The longus colli were dissected laterally, and self-retaining retractors placed to open the operative field. The microscope was then brought into the field.  With this complete, distractor pins were placed in the  vertebral bodies of C3 and C4. The distractor was placed, and the anulus at C3/4 was opened using a bovie.  Curettes and pituitary rongeurs used to remove the majority of disk, then the drill was used to remove the posterior osteophyte and begin the foraminotomies. The nerve hook was used to elevate the posterior longitudinal ligament, which was then removed with Kerrison rongeurs. The microblunt nerve hook could be passed out the foramen bilaterally.   Meticulous hemostasis obtained.  Structural allograft was tapped behind the anterior lip of the vertebral body at C3/4 (7 mm).    The caspar distractor was removed, and bone wax used for hemostasis. A separate, 18 mm Nuvasive ACP plate was chosen.  Two screws placed in each vertebral body, respectively making sure the screws were behind the locking mechanism.  Final AP and lateral radiographs were taken.   With everything in good position, the wound was irrigated copiously with bacitracin-containing solution and meticulous hemostasis obtained.  Wound was closed in 2 layers using interrupted inverted 3-0 Vicryl sutures.  The wound was dressed with dermabond, the head of bed at 30 degrees, taken to recovery room in stable condition.  No new postop neurological deficits were identified.  Sponge and pattie counts were correct at the end of the procedure.    I performed the entire procedure with Tammy Olp PA as an Pensions consultant.  Meade Maw MD

## 2019-04-09 NOTE — H&P (Signed)
History of Present Illness:  04/09/2019  Tammy Lamb continues to have left shoulder and neck pain. She presents today for surgical intervention.  02/27/2019  Tammy Lamb returns to see me. She continues to have pain into her left shoulder and into her neck.  Since her last visit, Tammy Lamb had an attempted C1-2 injection. Dr. Francis Gaines was unable to perform this, so performed an epidural steroid injection at C4-5.  She did have relief of her pain into her shoulder after the epidural steroid injection. However, the injection wore off. She continues to do exercises for her neck.  11/28/2018 Tammy Lamb is here today with a chief complaint of neck pain, pain/numbness that radiates to both shoulders and down both arms to all of her fingers.  I have previously evaluated her for lumbar symptoms.  Began having symptoms 6 months ago. She describes symptoms of pressure and aching that are as bad as 8 out of 10 and made worse by movement, bending, turning, and activity. Medications and rest make her symptoms better. She denies weakness or bowel bladder dysfunction.  Her pain is primarily at the top of her neck. She also describes left shoulder pain. She continues to have some ongoing chronic issues in her low back.  Conservative measures:  Physical therapy: has not tried Multimodal medical therapy including regular antiinflammatories: hydrocodone, tylenol, gabapentin, tizanidine,  Injections: has tried epidural steroid injections 08/26/2018: Cervical facet medial branch blocks C4-7  Past Surgery: L3-S1 decompression  Tammy Lamb has questionable symptoms of cervical myelopathy.  The symptoms are causing a significant impact on the patient's life.   Review of Systems:  A 10 point review of systems is negative, except for the pertinent positives and negatives detailed in the HPI.  Past Medical History: Past Medical History:  Diagnosis Date  . Depression  . Hyperlipidemia  . Hypertension  .  Osteoarthritis   Past Surgical History: Past Surgical History:  Procedure Laterality Date  . HYSTERECTOMY  . L3-S1 Lumbar Decompression 05/30/2017  Dr Meade Maw  . TONSILLECTOMY   Allergies:  Allergies  Allergen Reactions  . Sulfa Antibiotics Nausea And Vomiting     Medications:  Current Meds  Medication Sig  . acetaminophen (TYLENOL) 500 MG tablet Take 1,000 mg by mouth at bedtime as needed (pain).  Marland Kitchen amitriptyline (ELAVIL) 10 MG tablet Take 10 mg by mouth at bedtime.  Marland Kitchen amLODipine (NORVASC) 5 MG tablet Take 5 mg by mouth every evening.   Marland Kitchen atorvastatin (LIPITOR) 80 MG tablet Take 80 mg by mouth daily at 6 PM.  . Cholecalciferol (VITAMIN D3) 2000 units capsule Take 2,000 Units by mouth at bedtime.  Marland Kitchen CINNAMON PO Take 1,000 mg by mouth at bedtime.  . citalopram (CELEXA) 20 MG tablet Take 20 mg by mouth every evening.  . gabapentin (NEURONTIN) 300 MG capsule 300 mg day, 600 mg qhs (Patient taking differently: Take 300-600 mg by mouth See admin instructions. Take 300 mg by mouth in the morning and 600 mg at bedtime)  . hydrochlorothiazide (HYDRODIURIL) 25 MG tablet Take 25 mg by mouth daily.  . [EXPIRED] HYDROcodone-acetaminophen (NORCO) 7.5-325 MG tablet Take 1 tablet by mouth every 12 (twelve) hours as needed for severe pain. Must last 30 days.  Marland Kitchen HYDROcodone-acetaminophen (NORCO) 7.5-325 MG tablet Take 1 tablet by mouth every 12 (twelve) hours as needed for severe pain. Must last 30 days.  . Melatonin 10 MG TABS Take 10 mg by mouth at bedtime.   Tammy Lamb (ZEGERID OTC) 20-1100  MG CAPS capsule Take 1 capsule by mouth 2 (two) times daily.  Marland Kitchen oxybutynin (DITROPAN-XL) 5 MG 24 hr tablet Take 5 mg by mouth 2 (two) times daily.  . phenylephrine (NEO-SYNEPHRINE) 1 % nasal spray Place 1 drop into both nostrils daily as needed for congestion.  . ramipril (ALTACE) 10 MG capsule Take 10 mg by mouth daily.  Marland Kitchen sulfaSALAzine (AZULFIDINE) 500 MG tablet Take 500 mg  by mouth 2 (two) times daily.  Marland Kitchen tiZANidine (ZANAFLEX) 4 MG tablet Take 4 mg by mouth 2 (two) times daily.  . VENTOLIN HFA 108 (90 Base) MCG/ACT inhaler Inhale 2 puffs into the lungs every 6 (six) hours as needed for wheezing or shortness of breath.      Social History: Social History   Tobacco Use  . Smoking status: Current Every Day Smoker  Packs/day: 0.50  Types: Cigarettes  . Smokeless tobacco: Never Used  Substance Use Topics  . Alcohol use: Yes  . Drug use: Not on file   Family Medical History: Family History  Problem Relation Age of Onset  . No Known Problems Mother   Physical Examination: Vitals:   Vitals:   04/09/19 0706  BP: (!) 125/102  Pulse: 79  Resp: 16  Temp: (!) 96.4 F (35.8 C)  SpO2: 95%    Heart sounds normal no MRG. Chest Clear to Auscultation Bilaterally.   General: Patient is well developed, well nourished, calm, collected, and in no apparent distress. Attention to examination is appropriate.  Psychiatric: Patient is non-anxious.  Head: Pupils equal, round, and reactive to light.  ENT: Oral mucosa appears well hydrated.  Neck: Supple. Full range of motion.  Respiratory: Patient is breathing without any difficulty.  Extremities: No edema.  Vascular: Palpable dorsal pedal pulses.  Skin: On exposed skin, there are no abnormal skin lesions.  NEUROLOGICAL:   Awake, alert, oriented to person, place, and time. Speech is clear and fluent. Fund of knowledge is appropriate.   Cranial Nerves: Pupils equal round and reactive to light. Facial tone is symmetric. Facial sensation is symmetric. Shoulder shrug is symmetric. Tongue protrusion is midline. There is no pronator drift.  ROM of spine: full. Palpation of spine: moderately tender at base of skull and just below in paraspinus muscles.   Strength: Side Biceps Triceps Deltoid Interossei Grip Wrist Ext. Wrist Flex.  R 5 5 5 5 5 5 5   L 5 5 5 5 5 5 5    Side Iliopsoas Quads Hamstring PF DF  EHL  R 5 5 5 5 5 5   L 5 5 5 5 5 5    Reflexes are 1+ and symmetric at the biceps, triceps, brachioradialis, patella and achilles. Hoffman's is absent. Clonus is not present. Toes are down-going.  Bilateral upper and lower extremity sensation is intact to light touch.  Gait is normal. Rapid alternating movements are normal.   Medical Decision Making  Imaging: MRI C spine 10/08/2018 IMPRESSION: Edema in the dens, anterior arch of C1 and anterior aspects of the right and left C2 lateral masses could be due to degenerative disease, crystal arthropathy such as CPPD or inflammatory arthritis including the rheumatoid.  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  Mild left foraminal narrowing C4-5 and C5-6 due to uncovertebral disease.  Moderate to moderately severe left and mild right foraminal narrowing at C6-7 due to uncovertebral disease. Is also within the differential.  Electronically Signed By: Inge Rise  M.D. On: 10/08/2018 13:06  I have personally reviewed the images and agree with the above interpretation.  EMG 12/09/2018 Impression: This is an abnormal electrodiagnostic exam consistent with a bilateral C4 or C5 radiculopathy (localiztion of higher than C5 nerve root is theoratically challenging with EMG). 2) No evidence of myopathy. 3) right grade II carpal tunnel syndrome.  Thank you for the referral of this patient. It was our privilege to participate in care of your patient. Feel free to contact us with any further questions. _____________________________ Jennings Books, M.D.  Assessment and Plan: Tammy Lamb is a pleasant 68 y.o. female with neck pain and cervical radiculopathy.  She has failed conservative management. She has EMG evidence that confirms the C3-4 level is impacting her neurologic function. I have recommended C3-4 anterior cervical discectomy and fusion.      Meade Maw MD, [facility] Department of Neurosurgery

## 2019-04-10 MED ORDER — PROPOFOL 500 MG/50ML IV EMUL
INTRAVENOUS | Status: AC
  Filled 2019-04-10: qty 100

## 2019-04-10 MED ORDER — LIDOCAINE HCL (PF) 2 % IJ SOLN
INTRAMUSCULAR | Status: AC
  Filled 2019-04-10: qty 20

## 2019-04-10 MED ORDER — PROPOFOL 500 MG/50ML IV EMUL
INTRAVENOUS | Status: AC
  Filled 2019-04-10: qty 150

## 2019-04-10 MED ORDER — GLYCOPYRROLATE 0.2 MG/ML IJ SOLN
INTRAMUSCULAR | Status: AC
  Filled 2019-04-10: qty 2

## 2019-04-10 MED ORDER — GLYCOPYRROLATE 0.2 MG/ML IJ SOLN
INTRAMUSCULAR | Status: AC
  Filled 2019-04-10: qty 1

## 2019-04-28 ENCOUNTER — Encounter: Payer: Self-pay | Admitting: Student in an Organized Health Care Education/Training Program

## 2019-04-29 ENCOUNTER — Other Ambulatory Visit: Payer: Self-pay

## 2019-04-29 ENCOUNTER — Ambulatory Visit
Payer: Medicare Other | Attending: Student in an Organized Health Care Education/Training Program | Admitting: Student in an Organized Health Care Education/Training Program

## 2019-04-29 ENCOUNTER — Encounter: Payer: Self-pay | Admitting: Student in an Organized Health Care Education/Training Program

## 2019-04-29 DIAGNOSIS — M5030 Other cervical disc degeneration, unspecified cervical region: Secondary | ICD-10-CM

## 2019-04-29 DIAGNOSIS — Z981 Arthrodesis status: Secondary | ICD-10-CM

## 2019-04-29 DIAGNOSIS — M4692 Unspecified inflammatory spondylopathy, cervical region: Secondary | ICD-10-CM

## 2019-04-29 DIAGNOSIS — G894 Chronic pain syndrome: Secondary | ICD-10-CM

## 2019-04-29 DIAGNOSIS — M542 Cervicalgia: Secondary | ICD-10-CM

## 2019-04-29 MED ORDER — HYDROCODONE-ACETAMINOPHEN 7.5-325 MG PO TABS: 1.0000 | ORAL_TABLET | Freq: Two times a day (BID) | ORAL | 0 refills | Status: DC | PRN

## 2019-04-29 NOTE — Progress Notes (Signed)
Pain Management Virtual Encounter Note - Virtual Visit via [place] (real-time audio visits between healthcare provider and patient).   Patient's Phone No. & Preferred Pharmacy:  [phone] (home); [phone] (mobile); (Preferred) [phone] No e-mail address on record  [place], [place] [address] [place] [address] Phone: [phone] Fax: [phone]  [id number] [place]Phone: [phone]: [phone]   Pre-screening note:  Our staff contacted Tammy Lamb and offered her an "in person", "face-to-face" appointment versus a telephone encounter. She indicated preferring the telephone encounter, at this time.   Reason for Virtual Visit: COVID-19*  Social distancing based on CDC and AMA recommendations.   I contacted Tammy Lamb on 04/29/2019 via video conference.      I clearly identified myself as BGillis Santa MD. I verified that I was speaking with the correct person using two identifiers (Name: Tammy Lamb and date of birth: 209/04/1951.  Advanced Informed Consent I sought verbal advanced consent from Tammy Lamb virtual visit interactions. I informed Tammy Lamb of possible security and privacy concerns, risks, and limitations associated with providing "not-in-person" medical evaluation and management services. I also informed Tammy Lamb of the availability of "in-person" appointments. Finally, I informed her that there would be a charge for the virtual visit and that she could be  personally, fully or partially, financially responsible for it. Tammy Lamb understanding and agreed to proceed.   Historic Elements   Tammy Lamb is a 68y.o. year old, female patient evaluated today after her last encounter by our practice on 03/12/2019. Tammy Lamb has a past medical history of  Anxiety, Arthritis, Back pain, Colitis, ulcerative ([place], COPD (chronic obstructive pulmonary disease) ([place], Depression, GERD (gastroesophageal reflux disease), Headache, Hypertension, Motion sickness, Stroke ([place] (2015), TIA (transient ischemic attack) (2015), Urinary frequency, and Wears dentures. She also  has a past surgical history that includes Tonsillectomy; Cholecystectomy; Abdominal hysterectomy; Diagnostic laparoscopy; Bladder suspension; Colonoscopy; Upper gi endoscopy; Knee arthroscopy (Left, 02/13/2014); Ankle arthroscopy (Left, 03/22/2016); Tendon repair (Left, 03/22/2016); Lumbar laminectomy/decompression microdiscectomy (N/A, 05/30/2017); and Anterior cervical decomp/discectomy fusion (N/A, 04/09/2019). Ms. PConryhas a current medication list which includes the following prescription(s): acetaminophen, amitriptyline, amlodipine, atorvastatin, vitamin d3, citalopram, gabapentin, hydrochlorothiazide, melatonin, methocarbamol, omeprazole-sodium bicarbonate, phenylephrine, ramipril, sulfasalazine, tolterodine, ventolin hfa, hydrocodone-acetaminophen, and oxybutynin. She  reports that she has been smoking. She has a 24.50 pack-year smoking history. She has never used smokeless tobacco. She reports current alcohol use. She reports that she does not use drugs. Ms. PWigingtonis allergic to sulfa antibiotics.   HPI  Today, she is being contacted for medication management.   S/p C3-4 ACDF 04/09/2019. Is recovering from surgery. Having pain from operation. States that one PT exercise made it worse. Has stopped working with PT, did it for 1 week.  Has been utilizing Norco 5 mg every 6 hours as needed along with my hydrocodone 7.5 mg twice daily for baseline chronic pain.  The acetaminophen dosage that she is receiving with both of these opioid medications as appropriate and a good dose for analgesia.  She states that tizanidine was not effective and so she is currently on Robaxin.  750 mg 3 times a day as  needed.  She has a follow-up appointment with Dr. YCari Carawaynext week.   Pharmacotherapy Assessment  Analgesic:  03/08/2019  1  03/04/2019  Hydrocodone-Acetamin 7.5-325  60.00  30 Bi Lat   [id number]   Nor ([id number])   0  15.00 MME  Private Pay   [place]     Monitoring: Pharmacotherapy: No side-effects or adverse reactions reported. [place] PMP: PDMP reviewed during this encounter.       Compliance: No problems identified. Effectiveness: Clinically acceptable. Plan: Refer to "POC".  UDS:  Summary  Date Value Ref Range Status  09/27/2017 FINAL  Final    Comment:    ==================================================================== TOXASSURE COMP DRUG ANALYSIS,UR ==================================================================== Test                             Result       Flag       Units Drug Present and Declared for Prescription Verification   Noroxycodone                   193          EXPECTED   ng/mg creat    Noroxycodone is an expected metabolite of oxycodone. Sources of    oxycodone include scheduled prescription medications.   Citalopram                     PRESENT      EXPECTED   Desmethylcitalopram            PRESENT      EXPECTED    Desmethylcitalopram is an expected metabolite of citalopram or    the enantiomeric form, escitalopram.   Acetaminophen                  PRESENT      EXPECTED Drug Present not Declared for Prescription Verification   Salicylate                     PRESENT      UNEXPECTED Drug Absent but Declared for Prescription Verification   Oxycodone                      Not Detected UNEXPECTED ng/mg creat    Oxycodone is almost always present in patients taking this drug    consistently.  Absence of oxycodone could be due to lapse of time    since the last dose or unusual pharmacokinetics (rapid    metabolism).   Gabapentin                     Not Detected UNEXPECTED   Methocarbamol                  Not Detected  UNEXPECTED ==================================================================== Test                      Result    Flag   Units      Ref Range   Creatinine              30               mg/dL      >=20 ==================================================================== Declared Medications:  The flagging and interpretation on this report are based on the  following declared medications.  Unexpected results may arise from  inaccuracies in the declared medications.  **Note: The testing scope of this panel includes these medications:  Citalopram (Celexa)  Gabapentin  Methocarbamol (Robaxin)  Oxycodone  **Note: The testing scope of this panel does not  include small to  moderate amounts of these reported medications:  Acetaminophen  **Note: The testing scope of this panel does not include following  reported medications:  Atorvastatin (Lipitor)  Carvedilol (Coreg)  Hydrochlorothiazide (Hydrodiuril)  Melatonin  Omeprazole (Zegerid)  Phenylephrine  Ramipril (Altace)  Sodium Bicarbonate (Zegerid)  Sulfasalazine (Azulfidine)  Vitamin D3 ==================================================================== For clinical consultation, please call [phone]. ====================================================================    Laboratory Chemistry Profile (12 mo)  Renal: 04/02/2019: BUN 10; Creatinine, Ser 0.74  Lab Results  Component Value Date   GFRAA >60 04/02/2019   GFRNONAA >60 04/02/2019   Hepatic: No results found for requested labs within last 8760 hours. No results found for: AST, ALT Other: No results found for requested labs within last 8760 hours. Note: Above Lab results reviewed.  Imaging  Last 90 days:  Dg Cervical Spine 2 Or 3 Views  Result Date: 04/09/2019 CLINICAL DATA:  Status post surgical fusion. EXAM: CERVICAL SPINE - 2-3 VIEW COMPARISON:  Fluoroscopic images of same day. FINDINGS: Status post surgical anterior fusion of C3-4. Good alignment of  vertebral bodies is noted. No fracture or spondylolisthesis is noted. IMPRESSION: Status post surgical anterior fusion of C3-4. Electronically Signed   By: Marijo Conception M.D.   On: 04/09/2019 13:51   Dg Cervical Spine 2-3 Views  Result Date: 04/09/2019 CLINICAL DATA:  Elective cervical surgery. EXAM: CERVICAL SPINE - 2-3 VIEW; DG C-ARM 1-60 MIN Radiation exposure index: 0.2096 mGy. COMPARISON:  None. FINDINGS: Three intraoperative fluoroscopic images were obtained of the cervical spine. The initial image demonstrates surgical localization of the C3-4 disc space. Later images demonstrate the patient be status post surgical anterior fusion of C3-4. IMPRESSION: Fluoroscopic guidance provided during surgical anterior fusion of C3-4. Electronically Signed   By: Marijo Conception M.D.   On: 04/09/2019 10:24   Dg C-arm 1-60 Min  Result Date: 04/09/2019 CLINICAL DATA:  Elective cervical surgery. EXAM: CERVICAL SPINE - 2-3 VIEW; DG C-ARM 1-60 MIN Radiation exposure index: 0.2096 mGy. COMPARISON:  None. FINDINGS: Three intraoperative fluoroscopic images were obtained of the cervical spine. The initial image demonstrates surgical localization of the C3-4 disc space. Later images demonstrate the patient be status post surgical anterior fusion of C3-4. IMPRESSION: Fluoroscopic guidance provided during surgical anterior fusion of C3-4. Electronically Signed   By: Marijo Conception M.D.   On: 04/09/2019 10:24    Assessment  The primary encounter diagnosis was S/P cervical spinal fusion (C3-C4 ACDF (03/2019). Diagnoses of Inflammatory spondylopathy of cervical region [facility]), DDD (degenerative disc disease), cervical, Cervicalgia, and Chronic pain syndrome were also pertinent to this visit.  Plan of Care  I have discontinued Tammy Lamb's tiZANidine. I am also having her maintain her citalopram, atorvastatin, hydrochlorothiazide, ramipril, sulfaSALAzine, Vitamin D3, Omeprazole-Sodium Bicarbonate, acetaminophen,  Melatonin, phenylephrine, Ventolin HFA, amitriptyline, oxybutynin, gabapentin, amLODipine, methocarbamol, tolterodine, and HYDROcodone-acetaminophen.  Pharmacotherapy (Medications Ordered): Meds ordered this encounter  Medications  . HYDROcodone-acetaminophen (NORCO) 7.5-325 MG tablet    Sig: Take 1 tablet by mouth every 12 (twelve) hours as needed for severe pain. Must last 30 days.    Dispense:  60 tablet    Refill:  0    [facility] STOP ACT - Not applicable. Fill one day early if pharmacy is closed on scheduled refill date.   Continue gabapentin, Robaxin, amitriptyline as prescribed.  Instructed patient to do not supplement with acetaminophen as she is getting appropriate dose with hydrocodone.  Follow-up plan:   Return in about 8 weeks (around 06/24/2019) for Medication  Management.     Recent Visits Date Type Provider Dept  03/12/19 Office Visit Gillis Santa, MD [facility]  03/04/19 Office Visit Gillis Santa, MD [facility]  Showing recent visits within past 90 days and meeting all other requirements   Today's Visits Date Type Provider Dept  04/29/19 Office Visit Gillis Santa, MD [facility]  Showing today's visits and meeting all other requirements   Future Appointments No visits were found meeting these conditions.  Showing future appointments within next 90 days and meeting all other requirements   I discussed the assessment and treatment plan with the patient. The patient was provided an opportunity to ask questions and all were answered. The patient agreed with the plan and demonstrated an understanding of the instructions.  Patient advised to call back or seek an in-person evaluation if the symptoms or condition worsens.  Total duration of non-face-to-face encounter: 25 minutes.  Note by: Gillis Santa, MD Date: 04/29/2019; Time: 9:14 AM  Note: This dictation was prepared with Dragon dictation. Any transcriptional errors that may result from  this process are unintentional.  Disclaimer:  * Given the special circumstances of the COVID-19 pandemic, the federal government has announced that the Office for Civil Rights (OCR) will exercise its enforcement discretion and will not impose penalties on physicians using telehealth in the event of noncompliance with regulatory requirements under the [place] (HIPAA) in connection with the good faith provision of telehealth during the EBXID-56 national public health emergency. ([place])

## 2019-05-21 ENCOUNTER — Other Ambulatory Visit: Payer: Self-pay | Admitting: Student in an Organized Health Care Education/Training Program

## 2019-06-04 ENCOUNTER — Other Ambulatory Visit: Payer: Self-pay | Admitting: Student in an Organized Health Care Education/Training Program

## 2019-06-13 ENCOUNTER — Other Ambulatory Visit: Payer: Self-pay | Admitting: Internal Medicine

## 2019-06-13 DIAGNOSIS — Z1231 Encounter for screening mammogram for malignant neoplasm of breast: Secondary | ICD-10-CM

## 2019-06-16 ENCOUNTER — Telehealth: Payer: Self-pay | Admitting: *Deleted

## 2019-06-16 MED ORDER — GABAPENTIN 300 MG PO CAPS: ORAL_CAPSULE | ORAL | 2 refills | Status: DC

## 2019-06-16 NOTE — Telephone Encounter (Signed)
Requesting Gabapentin refill . Gabapentin 388m (1 in am and 2 at bedtime). Pharmacy confirmed.

## 2019-06-23 ENCOUNTER — Encounter: Payer: Self-pay | Admitting: Student in an Organized Health Care Education/Training Program

## 2019-06-24 ENCOUNTER — Ambulatory Visit
Payer: Medicare Other | Attending: Student in an Organized Health Care Education/Training Program | Admitting: Student in an Organized Health Care Education/Training Program

## 2019-06-24 ENCOUNTER — Encounter: Payer: Self-pay | Admitting: Student in an Organized Health Care Education/Training Program

## 2019-06-24 ENCOUNTER — Other Ambulatory Visit: Payer: Self-pay

## 2019-06-24 DIAGNOSIS — M961 Postlaminectomy syndrome, not elsewhere classified: Secondary | ICD-10-CM

## 2019-06-24 DIAGNOSIS — M5030 Other cervical disc degeneration, unspecified cervical region: Secondary | ICD-10-CM | POA: Diagnosis not present

## 2019-06-24 DIAGNOSIS — M47812 Spondylosis without myelopathy or radiculopathy, cervical region: Secondary | ICD-10-CM

## 2019-06-24 DIAGNOSIS — Z981 Arthrodesis status: Secondary | ICD-10-CM | POA: Diagnosis not present

## 2019-06-24 DIAGNOSIS — M4692 Unspecified inflammatory spondylopathy, cervical region: Secondary | ICD-10-CM | POA: Diagnosis not present

## 2019-06-24 DIAGNOSIS — M4302 Spondylolysis, cervical region: Secondary | ICD-10-CM

## 2019-06-24 DIAGNOSIS — M5412 Radiculopathy, cervical region: Secondary | ICD-10-CM

## 2019-06-24 DIAGNOSIS — M542 Cervicalgia: Secondary | ICD-10-CM | POA: Diagnosis not present

## 2019-06-24 DIAGNOSIS — G8929 Other chronic pain: Secondary | ICD-10-CM

## 2019-06-24 DIAGNOSIS — G894 Chronic pain syndrome: Secondary | ICD-10-CM

## 2019-06-24 MED ORDER — HYDROCODONE-ACETAMINOPHEN 7.5-325 MG PO TABS: 1.0000 | ORAL_TABLET | Freq: Two times a day (BID) | ORAL | 0 refills | Status: DC | PRN

## 2019-06-24 MED ORDER — TIZANIDINE HCL 4 MG PO TABS: 4.0000 mg | ORAL_TABLET | Freq: Three times a day (TID) | ORAL | 2 refills | Status: DC | PRN

## 2019-06-24 NOTE — Progress Notes (Signed)
Pain Management Virtual Encounter Note - Virtual Visit via [place] (real-time audio visits between healthcare provider and patient).   Patient's Phone No. & Preferred Pharmacy:  [phone] (home); [phone] (mobile); (Preferred) [phone] No e-mail address on record  [facility] [place] [address] [place] [address] Phone: [phone] Fax: [phone]  [id number] [place] [place]: [phone]: [phone]   Pre-screening note:  Our staff contacted Ms. Zullo and offered her an "in person", "face-to-face" appointment versus a telephone encounter. She indicated preferring the telephone encounter, at this time.   Reason for Virtual Visit: COVID-19*  Social distancing based on CDC and AMA recommendations.   I contacted Nicole D Bergeman on 06/24/2019 via video conference.      I clearly identified myself as BGillis Santa MD. I verified that I was speaking with the correct person using two identifiers (Name: SPENELOPI MIKRUT and date of birth: 2Dec 68, 1952.  Advanced Informed Consent I sought verbal advanced consent from SLoma Ricafor virtual visit interactions. I informed Ms. Caltagirone of possible security and privacy concerns, risks, and limitations associated with providing "not-in-person" medical evaluation and management services. I also informed Ms. Betts of the availability of "in-person" appointments. Finally, I informed her that there would be a charge for the virtual visit and that she could be  personally, fully or partially, financially responsible for it. Ms. PEspinoexpressed understanding and agreed to proceed.   Historic Elements   Ms. STaliyah WatrousPiner is a 68y.o. year old, female patient evaluated today after her last encounter by our practice on 06/16/2019. Ms. PTransue has a past medical history of  Anxiety, Arthritis, Back pain, Colitis, ulcerative ([place], COPD (chronic obstructive pulmonary disease) ([place], Depression, GERD (gastroesophageal reflux disease), Headache, Hypertension, Motion sickness, Stroke ([place] (2015), TIA (transient ischemic attack) (2015), Urinary frequency, and Wears dentures. She also  has a past surgical history that includes Tonsillectomy; Cholecystectomy; Abdominal hysterectomy; Diagnostic laparoscopy; Bladder suspension; Colonoscopy; Upper gi endoscopy; Knee arthroscopy (Left, 02/13/2014); Ankle arthroscopy (Left, 03/22/2016); Tendon repair (Left, 03/22/2016); Lumbar laminectomy/decompression microdiscectomy (N/A, 05/30/2017); and Anterior cervical decomp/discectomy fusion (N/A, 04/09/2019). Ms. PMattyhas a current medication list which includes the following prescription(s): acetaminophen, amlodipine, atorvastatin, vitamin d3, cinnamon, citalopram, gabapentin, hydrochlorothiazide, hydrocodone-acetaminophen, hydrocodone-acetaminophen, hydrocodone-acetaminophen, hydroxyzine, melatonin, nitrofurantoin (macrocrystal-monohydrate), omeprazole-sodium bicarbonate, oxybutynin, oxybutynin, phenazopyridine, phenylephrine, ramipril, sulfasalazine, tizanidine, tolterodine, and ventolin hfa. She  reports that she has been smoking. She has a 24.50 pack-year smoking history. She has never used smokeless tobacco. She reports current alcohol use. She reports that she does not use drugs. Ms. PBelzeris allergic to sulfa antibiotics.   HPI  Today, she is being contacted for medication management.   Patient continues to endorse intermittent neck pain.  She is status post cervical spine surgery on 04/29/2019 with Dr. YCari Caraway Patient is also struggling with interstitial cystitis for which she has been referred to a specialist [facility].  She states that she has an upcoming appointment in January. Patient has been complaining of dry mouth.  I instructed her to stop her amitriptyline.  In regards to  constipation, we discussed stool softener and MiraLAX that she can use to help out.  Pharmacotherapy Assessment  Analgesic:  06/02/2019  1   04/29/2019  Hydrocodone-Acetamin 7.5-325  60.00  30 Bi Lat   [id number]  Nor ([id number])   0  15.00 MME  Private Pay   [place]        Monitoring: Pharmacotherapy: No side-effects or adverse reactions reported. [place] PMP: PDMP reviewed during this encounter.       Compliance: No problems identified. Effectiveness: Clinically acceptable. Plan: Refer to "POC".  UDS:  Summary  Date Value Ref Range Status  09/27/2017 FINAL  Final    Comment:    ==================================================================== TOXASSURE COMP DRUG ANALYSIS,UR ==================================================================== Test                             Result       Flag       Units Drug Present and Declared for Prescription Verification   Noroxycodone                   193          EXPECTED   ng/mg creat    Noroxycodone is an expected metabolite of oxycodone. Sources of    oxycodone include scheduled prescription medications.   Citalopram                     PRESENT      EXPECTED   Desmethylcitalopram            PRESENT      EXPECTED    Desmethylcitalopram is an expected metabolite of citalopram or    the enantiomeric form, escitalopram.   Acetaminophen                  PRESENT      EXPECTED Drug Present not Declared for Prescription Verification   Salicylate                     PRESENT      UNEXPECTED Drug Absent but Declared for Prescription Verification   Oxycodone                      Not Detected UNEXPECTED ng/mg creat    Oxycodone is almost always present in patients taking this drug    consistently.  Absence of oxycodone could be due to lapse of time    since the last dose or unusual pharmacokinetics (rapid    metabolism).   Gabapentin                     Not Detected UNEXPECTED   Methocarbamol                  Not Detected  UNEXPECTED ==================================================================== Test                      Result    Flag   Units      Ref Range   Creatinine              30               mg/dL      >=20 ==================================================================== Declared Medications:  The flagging and interpretation on this report are based on the  following declared medications.  Unexpected results may arise from  inaccuracies in the declared medications.  **Note: The testing scope of this panel includes these medications:  Citalopram (Celexa)  Gabapentin  Methocarbamol (Robaxin)  Oxycodone  **Note: The testing scope of this panel does not include small to  moderate amounts of these reported medications:  Acetaminophen  **Note: The testing scope of  this panel does not include following  reported medications:  Atorvastatin (Lipitor)  Carvedilol (Coreg)  Hydrochlorothiazide (Hydrodiuril)  Melatonin  Omeprazole (Zegerid)  Phenylephrine  Ramipril (Altace)  Sodium Bicarbonate (Zegerid)  Sulfasalazine (Azulfidine)  Vitamin D3 ==================================================================== For clinical consultation, please call [phone]. ====================================================================    Laboratory Chemistry Profile (12 mo)  Renal: 04/02/2019: BUN 10; Creatinine, Ser 0.74  Lab Results  Component Value Date   GFRAA >60 04/02/2019   GFRNONAA >60 04/02/2019   Hepatic: No results found for requested labs within last 8760 hours. No results found for: AST, ALT Other: No results found for requested labs within last 8760 hours. Note: Above Lab results reviewed.  Imaging  DG Cervical Spine 2 or 3 views CLINICAL DATA:  Status post surgical fusion.  EXAM: CERVICAL SPINE - 2-3 VIEW  COMPARISON:  Fluoroscopic images of same day.  FINDINGS: Status post surgical anterior fusion of C3-4. Good alignment of vertebral bodies is noted. No fracture  or spondylolisthesis is noted.  IMPRESSION: Status post surgical anterior fusion of C3-4.  Electronically Signed   By: Marijo Conception M.D.   On: 04/09/2019 13:51 DG C-Arm 1-60 Min CLINICAL DATA:  Elective cervical surgery.  EXAM: CERVICAL SPINE - 2-3 VIEW; DG C-ARM 1-60 MIN  Radiation exposure index: 0.2096 mGy.  COMPARISON:  None.  FINDINGS: Three intraoperative fluoroscopic images were obtained of the cervical spine. The initial image demonstrates surgical localization of the C3-4 disc space. Later images demonstrate the patient be status post surgical anterior fusion of C3-4.  IMPRESSION: Fluoroscopic guidance provided during surgical anterior fusion of C3-4.  Electronically Signed   By: Marijo Conception M.D.   On: 04/09/2019 10:24 DG Cervical Spine 2-3 Views CLINICAL DATA:  Elective cervical surgery.  EXAM: CERVICAL SPINE - 2-3 VIEW; DG C-ARM 1-60 MIN  Radiation exposure index: 0.2096 mGy.  COMPARISON:  None.  FINDINGS: Three intraoperative fluoroscopic images were obtained of the cervical spine. The initial image demonstrates surgical localization of the C3-4 disc space. Later images demonstrate the patient be status post surgical anterior fusion of C3-4.  IMPRESSION: Fluoroscopic guidance provided during surgical anterior fusion of C3-4.  Electronically Signed   By: Marijo Conception M.D.   On: 04/09/2019 10:24   Assessment  The primary encounter diagnosis was S/P cervical spinal fusion (C3-C4 ACDF (03/2019). 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  Plan of Care   I have discontinued Khylee D. Pohlman's amitriptyline and methocarbamol. I have also changed her tiZANidine.  Additionally, I am having her start on HYDROcodone-acetaminophen and HYDROcodone-acetaminophen. Lastly, I am having her maintain her citalopram, atorvastatin, hydrochlorothiazide, ramipril, sulfaSALAzine, Vitamin D3, Omeprazole-Sodium Bicarbonate, acetaminophen, Melatonin, phenylephrine, Ventolin HFA, oxybutynin, amLODipine, tolterodine, gabapentin, oxybutynin, phenazopyridine, nitrofurantoin (macrocrystal-monohydrate), hydrOXYzine, Cinnamon, and HYDROcodone-acetaminophen.   1.  Refill hydrocodone as below 2.  Stop Robaxin, prescription for tizanidine as below. 3.  Stop amitriptyline given dry mouth.  This could also be stemming from oxybutynin as well.  Instructed patient that if for dry mouth does not improve after stopping amitriptyline and if she notices an increase in her chronic pain, to restart medication at his previous dose. 4.  Continue acetaminophen 1000 mg twice daily as needed, melatonin 10 mg nightly.  Pharmacotherapy (Medications Ordered): Meds ordered this encounter  Medications  . HYDROcodone-acetaminophen (NORCO) 7.5-325  MG tablet    Sig: Take 1 tablet by mouth every 12 (twelve) hours as needed for severe pain. Must last 30 days.    Dispense:  60 tablet    Refill:  0    [place] STOP ACT - Not applicable. Fill one day early if pharmacy is closed on scheduled refill date.  Marland Kitchen HYDROcodone-acetaminophen (NORCO) 7.5-325 MG tablet    Sig: Take 1 tablet by mouth every 12 (twelve) hours as needed for severe pain. Must last 30 days.    Dispense:  60 tablet    Refill:  0    [place] STOP ACT - Not applicable. Fill one day early if pharmacy is closed on scheduled refill date.  Marland Kitchen HYDROcodone-acetaminophen (NORCO) 7.5-325 MG tablet    Sig: Take 1 tablet by mouth every 12 (twelve) hours as needed for severe pain. Must last 30 days.    Dispense:  60 tablet    Refill:  0    [place] STOP ACT - Not applicable. Fill one day early if pharmacy is closed on scheduled refill date.  Marland Kitchen tiZANidine (ZANAFLEX) 4 MG  tablet    Sig: Take 1 tablet (4 mg total) by mouth every 8 (eight) hours as needed.    Dispense:  90 tablet    Refill:  2   Orders:  No orders of the defined types were placed in this encounter.  Follow-up plan:   Return in about 3 months (around 09/22/2019) for Medication Management, in person, (UDS).    Recent Visits Date Type Provider Dept  04/29/19 Office Visit Gillis Santa, MD [facility]  Showing recent visits within past 90 days and meeting all other requirements   Today's Visits Date Type Provider Dept  06/24/19 Office Visit Gillis Santa, MD [facility]  Showing today's visits and meeting all other requirements   Future Appointments No visits were found meeting these conditions.  Showing future appointments within next 90 days and meeting all other requirements   I discussed the assessment and treatment plan with the patient. The patient was provided an opportunity to ask questions and all were answered. The patient agreed with the plan and demonstrated an understanding of the instructions.  Patient advised to call back or seek an in-person evaluation if the symptoms or condition worsens.  Total duration of non-face-to-face encounter: 25 minutes.  Note by: Gillis Santa, MD Date: 06/24/2019; Time: 10:14 AM  Note: This dictation was prepared with Dragon dictation. Any transcriptional errors that may result from this process are unintentional.  Disclaimer:  * Given the special circumstances of the COVID-19 pandemic, the federal government has announced that the Office for Civil Rights (OCR) will exercise its enforcement discretion and will not impose penalties on physicians using telehealth in the event of noncompliance with regulatory requirements under the [place] and [place] (HIPAA) in connection with the good faith provision of telehealth during the FEXMD-47 national public health emergency. ([place])

## 2019-08-21 DIAGNOSIS — N9489 Other specified conditions associated with female genital organs and menstrual cycle: Secondary | ICD-10-CM | POA: Insufficient documentation

## 2019-08-21 DIAGNOSIS — N390 Urinary tract infection, site not specified: Secondary | ICD-10-CM | POA: Insufficient documentation

## 2019-08-21 DIAGNOSIS — M6289 Other specified disorders of muscle: Secondary | ICD-10-CM | POA: Insufficient documentation

## 2019-09-02 ENCOUNTER — Other Ambulatory Visit: Payer: Self-pay | Admitting: Student in an Organized Health Care Education/Training Program

## 2019-09-10 ENCOUNTER — Telehealth: Payer: Self-pay

## 2019-09-12 NOTE — Telephone Encounter (Signed)
No message

## 2019-09-13 ENCOUNTER — Other Ambulatory Visit: Payer: Self-pay | Admitting: Student in an Organized Health Care Education/Training Program

## 2019-09-13 DIAGNOSIS — G894 Chronic pain syndrome: Secondary | ICD-10-CM

## 2019-09-19 ENCOUNTER — Encounter: Payer: Self-pay | Admitting: Student in an Organized Health Care Education/Training Program

## 2019-09-22 ENCOUNTER — Ambulatory Visit
Payer: Medicare Other | Attending: Student in an Organized Health Care Education/Training Program | Admitting: Student in an Organized Health Care Education/Training Program

## 2019-09-22 ENCOUNTER — Encounter: Payer: Self-pay | Admitting: Student in an Organized Health Care Education/Training Program

## 2019-09-22 ENCOUNTER — Other Ambulatory Visit: Payer: Self-pay

## 2019-09-22 VITALS — BP 114/98 | HR 82 | Temp 97.3°F | Resp 18 | Ht 66.0 in | Wt 207.0 lb

## 2019-09-22 DIAGNOSIS — Z981 Arthrodesis status: Secondary | ICD-10-CM

## 2019-09-22 DIAGNOSIS — M5030 Other cervical disc degeneration, unspecified cervical region: Secondary | ICD-10-CM

## 2019-09-22 DIAGNOSIS — M4692 Unspecified inflammatory spondylopathy, cervical region: Secondary | ICD-10-CM | POA: Diagnosis not present

## 2019-09-22 DIAGNOSIS — M542 Cervicalgia: Secondary | ICD-10-CM

## 2019-09-22 DIAGNOSIS — G8929 Other chronic pain: Secondary | ICD-10-CM

## 2019-09-22 DIAGNOSIS — M47812 Spondylosis without myelopathy or radiculopathy, cervical region: Secondary | ICD-10-CM

## 2019-09-22 DIAGNOSIS — M4302 Spondylolysis, cervical region: Secondary | ICD-10-CM

## 2019-09-22 DIAGNOSIS — M5412 Radiculopathy, cervical region: Secondary | ICD-10-CM

## 2019-09-22 DIAGNOSIS — G894 Chronic pain syndrome: Secondary | ICD-10-CM

## 2019-09-22 MED ORDER — TIZANIDINE HCL 4 MG PO TABS: 4.0000 mg | ORAL_TABLET | Freq: Three times a day (TID) | ORAL | 2 refills | Status: DC | PRN

## 2019-09-22 MED ORDER — HYDROCODONE-ACETAMINOPHEN 7.5-325 MG PO TABS: 1.0000 | ORAL_TABLET | Freq: Three times a day (TID) | ORAL | 0 refills | Status: DC | PRN

## 2019-09-22 MED ORDER — GABAPENTIN 300 MG PO CAPS: ORAL_CAPSULE | ORAL | 2 refills | Status: DC

## 2019-09-22 MED ORDER — PREDNISONE 20 MG PO TABS: ORAL_TABLET | ORAL | 0 refills | Status: AC

## 2019-09-22 NOTE — Progress Notes (Signed)
PROVIDER NOTE: Information contained herein reflects review and annotations entered in association with encounter. Interpretation of such information and data should be left to medically-trained personnel. Information provided to patient can be located elsewhere in the medical record under "Patient Instructions". Document created using STT-dictation technology, any transcriptional errors that may result from process are unintentional.    Patient: Tammy Lamb  Service Category: E/M  Provider: Gillis Santa, MD  DOB: [birth year]  DOS: 09/22/2019  Referring Provider: Guadlupe Spanish, MD  MRN: [id number]  Setting: Ambulatory outpatient  PCP: Guadlupe Spanish, MD  Type: Established Patient  Specialty: Interventional Pain Management    Location: Office  Delivery: Face-to-face     Primary Reason(s) for Visit: Encounter for prescription drug management. (Level of risk: moderate)  CC: Neck Pain  HPI  Tammy Lamb is a 69 y.o. year old, female patient, who comes today for a medication management evaluation. 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 Her primarily concern today is the Neck Pain  Pain Assessment: Location: Right, Left Neck Radiating: up back of head left ear/jaw, shoulders bilateral and down arms to elbows Onset: More than a month ago Duration: Chronic pain Quality: Aching Severity: 9 /10 (subjective, self-reported pain score)  Note: Reported level is compatible with  observation. Effect on ADL: driinvg, movement ( catches all the time) Timing: Constant Modifying factors: medications "Gives me a bad drunk feeling and will have to lay down for a while" BP: (!) 114/98  HR: 82  Tammy Lamb was last scheduled for an appointment on 09/13/2019 for medication management. During today's appointment we reviewed Tammy Lamb's chronic pain status, as well as her outpatient medication regimen.  Patient follows up today for medication management and also endorses severe bilateral neck pain that radiates into her occiput, her shoulder and anterior chest region.  She is status post C3-C4 ACDF with Dr. Cari Caraway on September 2020.  She is almost 6 months out from her surgery and continues to endorse severe and persistent neck pain that radiates to bilateral shoulders and portions of her anterior chest.  She states that the pain is worse than it was prior to surgery.  She is having significant difficulty with ADLs and is having a hard time managing her pain.  She continues hydrocodone 7.5 mg twice daily, gabapentin 300 mg during the day, 600 mg nightly, tizanidine 4 mg 3 times daily as needed although when she states to help manage her pain to a certain extent but rotational motion of her neck aggravates her pain fairly quickly.  She states that she has not contacted Dr. Cari Caraway since their postoperative follow-up.  The patient  reports no history of drug use. Her body mass index is 33.41 kg/m.  Further details on both, my assessment(s), as well as the proposed treatment plan, please see below.  Controlled Substance Pharmacotherapy Assessment REMS (Risk Evaluation and Mitigation Strategy)   09/14/2019  1   06/24/2019  Hydrocodone-Acetamin 7.5-325  60.00  30 Bi Lat   [id number]   Nor (9414)   0  15.00 MME  Medicare   [place]     Ignatius Specking, RN  09/22/2019  1:21 PM  Sign when Signing Visit Nursing Pain Medication Assessment:  Safety precautions to be maintained throughout the  outpatient stay will include: orient to surroundings, keep bed in low position, maintain call bell within reach at all times, provide assistance with transfer out of bed and ambulation.  Medication Inspection Compliance: Pill count conducted under aseptic conditions, in front of the patient. Neither the pills nor the bottle was removed from the patient's sight at any time. Once count was completed pills were immediately returned to the patient in their original bottle.  Medication: Hydrocodone/APAP Pill/Patch Count: 53 of 60 pills remain Pill/Patch Appearance: Markings consistent with prescribed medication Bottle Appearance: Standard pharmacy container. Clearly labeled. Filled Date: 2 / 21 / 2018 Last Medication intake:  Yesterday   Pharmacokinetics: Liberation and absorption (onset of action): WNL Distribution (time to peak effect): WNL Metabolism and excretion (duration of action): WNL         Pharmacodynamics: Desired effects: Analgesia: Tammy Lamb reports 50% benefit. Functional ability: Patient reports that medication allows her to accomplish basic ADLs Clinically meaningful improvement in function (CMIF): Sustained CMIF goals met Perceived effectiveness: Described as relatively effective but with some room for improvement Undesirable effects: Side-effects or Adverse reactions: None reported Monitoring: [place] PMP: PDMP not reviewed this encounter. Online review of the past 67-monthperiod conducted. Compliant with practice rules and regulations Last UDS on record: Summary  Date Value Ref Range Status  09/27/2017 FINAL  Final    Comment:    ==================================================================== TOXASSURE COMP DRUG ANALYSIS,UR ==================================================================== Test                             Result       Flag       Units Drug Present and Declared for Prescription Verification   Noroxycodone                   193          EXPECTED    ng/mg creat    Noroxycodone is an expected metabolite of oxycodone. Sources of    oxycodone include scheduled prescription medications.   Citalopram                     PRESENT      EXPECTED   Desmethylcitalopram            PRESENT      EXPECTED    Desmethylcitalopram is an expected metabolite of citalopram or    the enantiomeric form, escitalopram.   Acetaminophen                  PRESENT      EXPECTED Drug Present not Declared for Prescription Verification   Salicylate                     PRESENT      UNEXPECTED Drug Absent but Declared for Prescription Verification   Oxycodone                      Not Detected UNEXPECTED ng/mg creat    Oxycodone is almost always present in patients taking this drug    consistently.  Absence of oxycodone could be due to lapse of time    since the last dose or unusual pharmacokinetics (rapid    metabolism).   Gabapentin  Not Detected UNEXPECTED   Methocarbamol                  Not Detected UNEXPECTED ==================================================================== Test                      Result    Flag   Units      Ref Range   Creatinine              30               mg/dL      >=20 ==================================================================== Declared Medications:  The flagging and interpretation on this report are based on the  following declared medications.  Unexpected results may arise from  inaccuracies in the declared medications.  **Note: The testing scope of this panel includes these medications:  Citalopram (Celexa)  Gabapentin  Methocarbamol (Robaxin)  Oxycodone  **Note: The testing scope of this panel does not include small to  moderate amounts of these reported medications:  Acetaminophen  **Note: The testing scope of this panel does not include following  reported medications:  Atorvastatin (Lipitor)  Carvedilol (Coreg)  Hydrochlorothiazide (Hydrodiuril)  Melatonin  Omeprazole (Zegerid)   Phenylephrine  Ramipril (Altace)  Sodium Bicarbonate (Zegerid)  Sulfasalazine (Azulfidine)  Vitamin D3 ==================================================================== For clinical consultation, please call [phone]. ====================================================================    UDS interpretation: Compliant          Medication Assessment Form: Reviewed. Patient indicates being compliant with therapy Treatment compliance: Compliant Risk Assessment Profile: Aberrant behavior: See initial evaluations. None observed or detected today Comorbid factors increasing risk of overdose: See initial evaluation. No additional risks detected today Opioid risk tool (ORT):  Opioid Risk  09/22/2019  Alcohol 0  Illegal Drugs 0  Rx Drugs 0  Alcohol 0  Illegal Drugs 0  Rx Drugs 0  Age between 16-45 years  -  History of Preadolescent Sexual Abuse -  Psychological Disease 2  ADD Negative  OCD Negative  Bipolar Negative  Depression 1  Opioid Risk Tool Scoring 3  Opioid Risk Interpretation Low Risk    ORT Scoring interpretation table:  Score <3 = Low Risk for SUD  Score between 4-7 = Moderate Risk for SUD  Score >8 = High Risk for Opioid Abuse   Risk of substance use disorder (SUD): Low  Risk Mitigation Strategies:  Patient Counseling: Covered Patient-Prescriber Agreement (PPA): Present and active  Notification to other healthcare providers: Done  Pharmacologic Plan: Increase hydrocodone to 7.5 mg 3 times daily as needed             Laboratory Chemistry Profile   Renal Lab Results  Component Value Date   BUN 10 04/02/2019   CREATININE 0.74 04/02/2019   GFRAA >60 04/02/2019   GFRNONAA >60 04/02/2019   PROTEINUR NEGATIVE 09/20/2018    Electrolytes Lab Results  Component Value Date   NA 134 (L) 04/02/2019   K 3.9 04/02/2019   CL 93 (L) 04/02/2019   CALCIUM 9.2 04/02/2019    Hepatic No results found for: AST, ALT, ALBUMIN, ALKPHOS, AMYLASE, LIPASE, AMMONIA   ID Lab Results  Component Value Date   SARSCOV2NAA NEGATIVE 04/07/2019   STAPHAUREUS NEGATIVE 04/02/2019   MRSAPCR NEGATIVE 04/02/2019    Bone No results found for: VD25OH, VD125OH2TOT, YY5035WS5, KC1275TZ0, 25OHVITD1, 25OHVITD2, 25OHVITD3, TESTOFREE, TESTOSTERONE  Endocrine Lab Results  Component Value Date   GLUCOSE 101 (H) 04/02/2019   GLUCOSEU NEGATIVE 09/20/2018    Neuropathy No results found for: VITAMINB12, FOLATE,  HGBA1C, HIV  CNS No results found for: COLORCSF, APPEARCSF, RBCCOUNTCSF, WBCCSF, POLYSCSF, LYMPHSCSF, EOSCSF, PROTEINCSF, GLUCCSF, JCVIRUS, CSFOLI, IGGCSF, LABACHR, ACETBL, LABACHR, ACETBL  Inflammation (CRP: Acute  ESR: Chronic) No results found for: CRP, ESRSEDRATE, LATICACIDVEN  Rheumatology No results found for: RF, ANA, LABURIC, URICUR, LYMEIGGIGMAB, LYMEABIGMQN, HLAB27  Coagulation Lab Results  Component Value Date   INR 1.1 04/02/2019   LABPROT 13.8 04/02/2019   APTT 34 04/02/2019   PLT 352 04/02/2019    Cardiovascular Lab Results  Component Value Date   TROPONINI <0.03 09/20/2018   HGB 12.4 04/02/2019   HCT 37.5 04/02/2019    Screening Lab Results  Component Value Date   SARSCOV2NAA NEGATIVE 04/07/2019   STAPHAUREUS NEGATIVE 04/02/2019   MRSAPCR NEGATIVE 04/02/2019    Cancer No results found for: CEA, CA125, LABCA2  Allergens No results found for: ALMOND, APPLE, ASPARAGUS, AVOCADO, BANANA, BARLEY, BASIL, BAYLEAF, GREENBEAN, LIMABEAN, WHITEBEAN, BEEFIGE, REDBEET, BLUEBERRY, BROCCOLI, CABBAGE, MELON, CARROT, CASEIN, CASHEWNUT, CAULIFLOWER, CELERY    Note: Lab results reviewed.   Recent Diagnostic Imaging Results  DG Cervical Spine 2 or 3 views CLINICAL DATA:  Status post surgical fusion.  EXAM: CERVICAL SPINE - 2-3 VIEW  COMPARISON:  Fluoroscopic images of same day.  FINDINGS: Status post surgical anterior fusion of C3-4. Good alignment of vertebral bodies is noted. No fracture or spondylolisthesis is noted.  IMPRESSION: Status  post surgical anterior fusion of C3-4.  Electronically Signed   By: Marijo Conception M.D.   On: 04/09/2019 13:51 DG C-Arm 1-60 Min CLINICAL DATA:  Elective cervical surgery.  EXAM: CERVICAL SPINE - 2-3 VIEW; DG C-ARM 1-60 MIN  Radiation exposure index: 0.2096 mGy.  COMPARISON:  None.  FINDINGS: Three intraoperative fluoroscopic images were obtained of the cervical spine. The initial image demonstrates surgical localization of the C3-4 disc space. Later images demonstrate the patient be status post surgical anterior fusion of C3-4.  IMPRESSION: Fluoroscopic guidance provided during surgical anterior fusion of C3-4.  Electronically Signed   By: Marijo Conception M.D.   On: 04/09/2019 10:24 DG Cervical Spine 2-3 Views CLINICAL DATA:  Elective cervical surgery.  EXAM: CERVICAL SPINE - 2-3 VIEW; DG C-ARM 1-60 MIN  Radiation exposure index: 0.2096 mGy.  COMPARISON:  None.  FINDINGS: Three intraoperative fluoroscopic images were obtained of the cervical spine. The initial image demonstrates surgical localization of the C3-4 disc space. Later images demonstrate the patient be status post surgical anterior fusion of C3-4.  IMPRESSION: Fluoroscopic guidance provided during surgical anterior fusion of C3-4.  Electronically Signed   By: Marijo Conception M.D.   On: 04/09/2019 10:24  Complexity Note: Imaging results reviewed. Results shared with Ms. Constantin, using Layman's terms.                               Meds   Current Outpatient Medications:  .  acetaminophen (TYLENOL) 500 MG tablet, Take 1,000 mg by mouth at bedtime as needed (pain)., Disp: , Rfl:  .  amitriptyline (ELAVIL) 25 MG tablet, Take 25 mg by mouth at bedtime., Disp: , Rfl:  .  amLODipine (NORVASC) 5 MG tablet, Take 5 mg by mouth every evening. , Disp: , Rfl:  .  atorvastatin (LIPITOR) 80 MG tablet, Take 80 mg by mouth daily at 6 PM., Disp: , Rfl:  .  Cholecalciferol (VITAMIN D3) 2000 units capsule, Take  2,000 Units by mouth at bedtime., Disp: , Rfl:  .  Cinnamon  500 MG TABS, Take 2 tablets by mouth daily., Disp: , Rfl:  .  citalopram (CELEXA) 20 MG tablet, Take 20 mg by mouth every evening., Disp: , Rfl:  .  gabapentin (NEURONTIN) 300 MG capsule, 300 mg day, 600 mg qhs, Disp: 90 capsule, Rfl: 2 .  hydrochlorothiazide (HYDRODIURIL) 25 MG tablet, Take 25 mg by mouth daily., Disp: , Rfl:  .  [START ON 10/05/2019] HYDROcodone-acetaminophen (NORCO) 7.5-325 MG tablet, Take 1 tablet by mouth every 8 (eight) hours as needed for severe pain. Must last 30 days., Disp: 60 tablet, Rfl: 0 .  [START ON 11/04/2019] HYDROcodone-acetaminophen (NORCO) 7.5-325 MG tablet, Take 1 tablet by mouth every 8 (eight) hours as needed for severe pain. Must last 30 days., Disp: 60 tablet, Rfl: 0 .  [START ON 12/04/2019] HYDROcodone-acetaminophen (NORCO) 7.5-325 MG tablet, Take 1 tablet by mouth every 8 (eight) hours as needed for severe pain. Must last 30 days., Disp: 60 tablet, Rfl: 0 .  hydrOXYzine (ATARAX/VISTARIL) 25 MG tablet, Take 25 mg by mouth at bedtime., Disp: , Rfl:  .  Melatonin 10 MG TABS, Take 10 mg by mouth at bedtime. , Disp: , Rfl:  .  nitrofurantoin, macrocrystal-monohydrate, (MACROBID) 100 MG capsule, Take 100 mg by mouth daily., Disp: , Rfl:  .  Omeprazole-Sodium Bicarbonate (ZEGERID OTC) 20-1100 MG CAPS capsule, Take 1 capsule by mouth 2 (two) times daily., Disp: , Rfl:  .  oxybutynin (DITROPAN) 5 MG tablet, Take 5 mg by mouth 3 (three) times daily., Disp: , Rfl:  .  oxybutynin (DITROPAN-XL) 5 MG 24 hr tablet, Take 5 mg by mouth 2 (two) times daily., Disp: , Rfl:  .  phenazopyridine (PYRIDIUM) 200 MG tablet, Take 200 mg by mouth 3 (three) times daily as needed., Disp: , Rfl:  .  phenylephrine (NEO-SYNEPHRINE) 1 % nasal spray, Place 1 drop into both nostrils daily as needed for congestion., Disp: , Rfl:  .  ramipril (ALTACE) 10 MG capsule, Take 10 mg by mouth daily., Disp: , Rfl:  .  sulfaSALAzine (AZULFIDINE)  500 MG tablet, Take 500 mg by mouth 2 (two) times daily., Disp: , Rfl:  .  tiZANidine (ZANAFLEX) 4 MG tablet, Take 1 tablet (4 mg total) by mouth every 8 (eight) hours as needed., Disp: 90 tablet, Rfl: 2 .  tolterodine (DETROL LA) 4 MG 24 hr capsule, Take 4 mg by mouth daily., Disp: , Rfl:  .  trimethoprim (TRIMPEX) 100 MG tablet, 1 po qd, Disp: , Rfl:  .  VENTOLIN HFA 108 (90 Base) MCG/ACT inhaler, Inhale 2 puffs into the lungs every 6 (six) hours as needed for wheezing or shortness of breath. , Disp: , Rfl:  .  predniSONE (DELTASONE) 20 MG tablet, Take 3 tablets (60 mg total) by mouth daily with breakfast for 3 days, THEN 2 tablets (40 mg total) daily with breakfast for 3 days, THEN 1 tablet (20 mg total) daily with breakfast for 3 days., Disp: 18 tablet, Rfl: 0  ROS  Constitutional: Denies any fever or chills Gastrointestinal: No reported hemesis, hematochezia, vomiting, or acute GI distress Musculoskeletal: Denies any acute onset joint swelling, redness, loss of ROM, or weakness Neurological: No reported episodes of acute onset apraxia, aphasia, dysarthria, agnosia, amnesia, paralysis, loss of coordination, or loss of consciousness  Allergies  Ms. Azpeitia is allergic to sulfa antibiotics.  PFSH  Drug: Ms. Templeman  reports no history of drug use. Alcohol:  reports current alcohol use. Tobacco:  reports that she has been smoking. She has  a 24.50 pack-year smoking history. She has never used smokeless tobacco. Medical:  has a past medical history of Anxiety, Arthritis, Back pain, Colitis, ulcerative ([place]), COPD (chronic obstructive pulmonary disease) ([place]), Depression, GERD (gastroesophageal reflux disease), H/O bladder infections, Headache, Hypertension, Motion sickness, Stroke ([place]) (2015), TIA (transient ischemic attack) (2015), Urinary frequency, and Wears dentures. Surgical: Ms. Schloss  has a past surgical history that includes Tonsillectomy; Cholecystectomy; Abdominal hysterectomy; Diagnostic  laparoscopy; Bladder suspension; Colonoscopy; Upper gi endoscopy; Knee arthroscopy (Left, 02/13/2014); Ankle arthroscopy (Left, 03/22/2016); Tendon repair (Left, 03/22/2016); Lumbar laminectomy/decompression microdiscectomy (N/A, 05/30/2017); and Anterior cervical decomp/discectomy fusion (N/A, 04/09/2019). Family: family history includes COPD in her father; Diabetes in her mother; Heart disease in her father; Hypertension in her father and mother.  Constitutional Exam  General appearance: alert, cooperative and in mild distress very pleasant Vitals:   09/22/19 1309  BP: (!) 114/98  Pulse: 82  Resp: 18  Temp: (!) 97.3 F (36.3 C)  SpO2: 95%  Weight: 207 lb (93.9 kg)  Height: 5' 6" (1.676 m)   BMI Assessment: Estimated body mass index is 33.41 kg/m as calculated from the following:   Height as of this encounter: 5' 6" (1.676 m).   Weight as of this encounter: 207 lb (93.9 kg).  BMI interpretation table: BMI level Category Range association with higher incidence of chronic pain  <18 kg/m2 Underweight   18.5-24.9 kg/m2 Ideal body weight   25-29.9 kg/m2 Overweight Increased incidence by 20%  30-34.9 kg/m2 Obese (Class I) Increased incidence by 68%  35-39.9 kg/m2 Severe obesity (Class II) Increased incidence by 136%  >40 kg/m2 Extreme obesity (Class III) Increased incidence by 254%   Patient's current BMI Ideal Body weight  Body mass index is 33.41 kg/m. Ideal body weight: 59.3 kg (130 lb 11.7 oz) Adjusted ideal body weight: 73.1 kg (161 lb 3.8 oz)   BMI Readings from Last 4 Encounters:  09/22/19 33.41 kg/m  04/02/19 36.24 kg/m  10/01/18 34.95 kg/m  09/20/18 35.78 kg/m   Wt Readings from Last 4 Encounters:  09/22/19 207 lb (93.9 kg)  04/02/19 217 lb 12.8 oz (98.8 kg)  10/01/18 210 lb (95.3 kg)  09/20/18 215 lb (97.5 kg)    Psych/Mental status: Alert, oriented x 3 (person, place, & time)       Eyes: PERLA Respiratory: No evidence of acute respiratory distress  Cervical  Spine Exam  Skin & Axial Inspection: Well healed scar from previous spine surgery detected Alignment: Symmetrical Functional ROM: Pain restricted ROM      Stability: No instability detected Muscle Tone/Strength: Functionally intact. No obvious neuro-muscular anomalies detected. Sensory (Neurological): Dermatomal pain pattern Palpation: Complains of area being tender to palpation             Positive Spurling's bilaterally  Upper Extremity (UE) Exam    Side: Right upper extremity  Side: Left upper extremity  Skin & Extremity Inspection: Skin color, temperature, and hair growth are WNL. No peripheral edema or cyanosis. No masses, redness, swelling, asymmetry, or associated skin lesions. No contractures.  Skin & Extremity Inspection: Skin color, temperature, and hair growth are WNL. No peripheral edema or cyanosis. No masses, redness, swelling, asymmetry, or associated skin lesions. No contractures.  Functional ROM: Pain restricted ROM          Functional ROM: Pain restricted ROM          Muscle Tone/Strength: Functionally intact. No obvious neuro-muscular anomalies detected.  Muscle Tone/Strength: Functionally intact. No obvious neuro-muscular anomalies detected.  Sensory (  Neurological): Referred pain pattern          Sensory (Neurological): Referred pain pattern          Palpation: No palpable anomalies              Palpation: No palpable anomalies              Provocative Test(s):  Phalen's test: deferred Tinel's test: deferred Apley's scratch test (touch opposite shoulder):  Action 1 (Across chest): Decreased ROM Action 2 (Overhead): Decreased ROM Action 3 (LB reach): Decreased ROM   Provocative Test(s):  Phalen's test: deferred Tinel's test: deferred Apley's scratch test (touch opposite shoulder):  Action 1 (Across chest): Decreased ROM Action 2 (Overhead): Decreased ROM Action 3 (LB reach): Decreased ROM    4 out of 5 strength bilateral upper extremity: Shoulder abduction, elbow  flexion, elbow extension, thumb extension.  Thoracic Spine Area Exam  Skin & Axial Inspection: No masses, redness, or swelling Alignment: Symmetrical Functional ROM: Unrestricted ROM Stability: No instability detected Muscle Tone/Strength: Functionally intact. No obvious neuro-muscular anomalies detected. Sensory (Neurological): Unimpaired Muscle strength & Tone: No palpable anomalies  Lumbar Exam  Skin & Axial Inspection: Well healed scar from previous spine surgery detected Alignment: Asymmetric Functional ROM: Pain restricted ROM       Stability: No instability detected Muscle Tone/Strength: Functionally intact. No obvious neuro-muscular anomalies detected. Sensory (Neurological): Musculoskeletal pain pattern Palpation: No palpable anomalies         Gait & Posture Assessment  Ambulation: Limited Gait: Antalgic gait (limping) Posture: Difficulty standing up straight, due to pain   Lower Extremity Exam    Side: Right lower extremity  Side: Left lower extremity  Stability: No instability observed          Stability: No instability observed          Skin & Extremity Inspection: Skin color, temperature, and hair growth are WNL. No peripheral edema or cyanosis. No masses, redness, swelling, asymmetry, or associated skin lesions. No contractures.  Skin & Extremity Inspection: Skin color, temperature, and hair growth are WNL. No peripheral edema or cyanosis. No masses, redness, swelling, asymmetry, or associated skin lesions. No contractures.  Functional ROM: Pain restricted ROM for all joints of the lower extremity          Functional ROM: Restricted ROM for all joints of the lower extremity          Muscle Tone/Strength: Functionally intact. No obvious neuro-muscular anomalies detected.  Muscle Tone/Strength: Functionally intact. No obvious neuro-muscular anomalies detected.  Sensory (Neurological): Unimpaired        Sensory (Neurological): Unimpaired        DTR: Patellar: deferred  today Achilles: deferred today Plantar: deferred today  DTR: Patellar: deferred today Achilles: deferred today Plantar: deferred today  Palpation: No palpable anomalies  Palpation: No palpable anomalies   Assessment   Status Diagnosis  Stable Persistent Persistent 1. S/P cervical spinal fusion (C3-C4 ACDF (03/2019)   2. Inflammatory spondylopathy of cervical region ([place])   3. DDD (degenerative disc disease), cervical   4. Cervicalgia   5. Chronic pain syndrome   6. Cervical facet joint syndrome   7. Cervical spondylolysis   8. Chronic radicular cervical pain       1.  Status post C3-C4 ACDF in September 2020 with no change in her pain condition in fact patient states that her pain is worse at times and she is having significant limitations performing ADLs as even minor neck movements will cause her  to have neck pain and posterior headaches.  Recommend cervical MRI with and without contrast for work-up.  2.  Increase hydrocodone to 7.5 mg 3 times daily as needed (from twice daily as needed) in the interim as we work-up her increased neck pain.  UDS today for annual medication compliance monitoring  3.  Continue gabapentin as prescribed  4.  Continue Zanaflex as prescribed.   Plan of Care  Pharmacotherapy (Medications Ordered): Meds ordered this encounter  Medications  . HYDROcodone-acetaminophen (NORCO) 7.5-325 MG tablet    Sig: Take 1 tablet by mouth every 8 (eight) hours as needed for severe pain. Must last 30 days.    Dispense:  60 tablet    Refill:  0    [place] STOP ACT - Not applicable. Fill one day early if pharmacy is closed on scheduled refill date.  Marland Kitchen HYDROcodone-acetaminophen (NORCO) 7.5-325 MG tablet    Sig: Take 1 tablet by mouth every 8 (eight) hours as needed for severe pain. Must last 30 days.    Dispense:  60 tablet    Refill:  0    [place] STOP ACT - Not applicable. Fill one day early if pharmacy is closed on scheduled refill date.  Marland Kitchen HYDROcodone-acetaminophen  (NORCO) 7.5-325 MG tablet    Sig: Take 1 tablet by mouth every 8 (eight) hours as needed for severe pain. Must last 30 days.    Dispense:  60 tablet    Refill:  0    [place] STOP ACT - Not applicable. Fill one day early if pharmacy is closed on scheduled refill date.  . gabapentin (NEURONTIN) 300 MG capsule    Sig: 300 mg day, 600 mg qhs    Dispense:  90 capsule    Refill:  2    Do not place this medication, or any other prescription from our practice, on "Automatic Refill". Patient may have prescription filled one day early if pharmacy is closed on scheduled refill date.  Marland Kitchen tiZANidine (ZANAFLEX) 4 MG tablet    Sig: Take 1 tablet (4 mg total) by mouth every 8 (eight) hours as needed.    Dispense:  90 tablet    Refill:  2  . predniSONE (DELTASONE) 20 MG tablet    Sig: Take 3 tablets (60 mg total) by mouth daily with breakfast for 3 days, THEN 2 tablets (40 mg total) daily with breakfast for 3 days, THEN 1 tablet (20 mg total) daily with breakfast for 3 days.    Dispense:  18 tablet    Refill:  0   Orders:  Orders Placed This Encounter  Procedures  . MR CERVICAL SPINE W WO CONTRAST    Patient presents with axial pain with possible radicular component.  In addition to any acute findings, please report on:  1. Facet (Zygapophyseal) joint DJD (Hypertrophy, space narrowing, subchondral sclerosis, and/or osteophyte formation) 2. DDD and/or IVDD (Loss of disc height, desiccation or "Black disc disease") 3. Pars defects 4. Spondylolisthesis, spondylosis, and/or spondyloarthropathies (include Degree/Grade of displacement in mm) 5. Vertebral body Fractures, including age (old, new/acute) 25. Modic Type Changes 7. Demineralization 8. Bone pathology 9. Central, Lateral Recess, and/or Foraminal Stenosis (include AP diameter of stenosis in mm) 10. Surgical changes (hardware type, status, and presence of fibrosis) NOTE: Please specify level(s) and laterality.    Standing Status:   Future    Standing  Expiration Date:   12/23/2019    Order Specific Question:   If indicated for the ordered procedure, I authorize the administration  of contrast media per Radiology protocol    Answer:   Yes    Order Specific Question:   What is the patient's sedation requirement?    Answer:   No Sedation    Order Specific Question:   Does the patient have a pacemaker or implanted devices?    Answer:   No    Order Specific Question:   Call Results- Best Contact Number?    Answer:   [phone] ([facility])    Order Specific Question:   Radiology Contrast Protocol - do NOT remove file path    Answer:   _[email or web address]    Order Specific Question:   Preferred imaging location?    Answer:   [facility] (table limit-350lbs)  . ToxASSURE Select 13 (MW), Urine    Volume: 30 ml(s). Minimum 3 ml of urine is needed. Document temperature of fresh sample. Indications: Long term (current) use of opiate analgesic (Z79.891)    Lab Orders     ToxASSURE Select 13 (MW), Urine  Imaging Orders     MR CERVICAL SPINE W WO CONTRAST Planned follow-up:   Return in about 3 months (around 12/23/2019) for Medication Management, in person.    Recent Visits Date Type Provider Dept  06/24/19 Office Visit Gillis Santa, MD [facility]  Showing recent visits within past 90 days and meeting all other requirements   Today's Visits Date Type Provider Dept  09/22/19 Office Visit Gillis Santa, MD [facility]  Showing today's visits and meeting all other requirements   Future Appointments No visits were found meeting these conditions.  Showing future appointments within next 90 days and meeting all other requirements   Primary Care Physician: Guadlupe Spanish, MD Location: [facility] Note by: Gillis Santa, MD Date: 09/22/2019; Time: 2:03 PM  Note: This dictation was prepared with Dragon dictation. Any transcriptional errors that may  result from this process are unintentional.

## 2019-09-22 NOTE — Progress Notes (Signed)
Nursing Pain Medication Assessment:  Safety precautions to be maintained throughout the outpatient stay will include: orient to surroundings, keep bed in low position, maintain call bell within reach at all times, provide assistance with transfer out of bed and ambulation.  Medication Inspection Compliance: Pill count conducted under aseptic conditions, in front of the patient. Neither the pills nor the bottle was removed from the patient's sight at any time. Once count was completed pills were immediately returned to the patient in their original bottle.  Medication: Hydrocodone/APAP Pill/Patch Count: 53 of 60 pills remain Pill/Patch Appearance: Markings consistent with prescribed medication Bottle Appearance: Standard pharmacy container. Clearly labeled. Filled Date: 2 / 21 / 2018 Last Medication intake:  Yesterday

## 2019-09-27 LAB — TOXASSURE SELECT 13 (MW), URINE

## 2019-10-10 ENCOUNTER — Ambulatory Visit
Admission: RE | Admit: 2019-10-10 | Discharge: 2019-10-10 | Disposition: A | Discharge status: Home / Self Care | Payer: Medicare Other | Source: Ambulatory Visit | Attending: Student in an Organized Health Care Education/Training Program | Admitting: Student in an Organized Health Care Education/Training Program

## 2019-10-10 ENCOUNTER — Other Ambulatory Visit: Payer: Self-pay

## 2019-10-10 DIAGNOSIS — M5412 Radiculopathy, cervical region: Secondary | ICD-10-CM | POA: Insufficient documentation

## 2019-10-10 DIAGNOSIS — Z981 Arthrodesis status: Secondary | ICD-10-CM

## 2019-10-10 DIAGNOSIS — M4692 Unspecified inflammatory spondylopathy, cervical region: Secondary | ICD-10-CM | POA: Diagnosis present

## 2019-10-10 DIAGNOSIS — G8929 Other chronic pain: Secondary | ICD-10-CM

## 2019-10-10 LAB — POCT I-STAT CREATININE: Creatinine, Ser: 0.8 mg/dL (ref 0.44–1.00)

## 2019-10-10 MED ORDER — GADOBUTROL 1 MMOL/ML IV SOLN
9.0000 mL | Freq: Once | INTRAVENOUS | Status: AC | PRN
  Administered 2019-10-10: 9 mL via INTRAVENOUS

## 2019-10-13 ENCOUNTER — Telehealth: Payer: Self-pay | Admitting: *Deleted

## 2019-10-13 ENCOUNTER — Telehealth: Payer: Self-pay | Admitting: Student in an Organized Health Care Education/Training Program

## 2019-10-13 NOTE — Telephone Encounter (Signed)
Called patient and read Impression statement to her . Patient would like to her a VV to review her Cervical MRI with Dr Holley Raring. Next appt not until June, She will make one for a earlier time.

## 2019-10-13 NOTE — Telephone Encounter (Signed)
Patient called requesting results of her MRI. Her next appt isnt until June.

## 2019-10-14 ENCOUNTER — Ambulatory Visit
Payer: Medicare Other | Attending: Student in an Organized Health Care Education/Training Program | Admitting: Student in an Organized Health Care Education/Training Program

## 2019-10-14 ENCOUNTER — Other Ambulatory Visit: Payer: Self-pay

## 2019-10-14 ENCOUNTER — Encounter: Payer: Self-pay | Admitting: Student in an Organized Health Care Education/Training Program

## 2019-10-14 DIAGNOSIS — M4692 Unspecified inflammatory spondylopathy, cervical region: Secondary | ICD-10-CM | POA: Diagnosis not present

## 2019-10-14 DIAGNOSIS — M5030 Other cervical disc degeneration, unspecified cervical region: Secondary | ICD-10-CM

## 2019-10-14 DIAGNOSIS — G894 Chronic pain syndrome: Secondary | ICD-10-CM

## 2019-10-14 DIAGNOSIS — Z981 Arthrodesis status: Secondary | ICD-10-CM | POA: Diagnosis not present

## 2019-10-14 DIAGNOSIS — M542 Cervicalgia: Secondary | ICD-10-CM

## 2019-10-14 NOTE — Progress Notes (Signed)
Patient: Tammy Lamb  Service Category: E/M  Provider: Gillis Santa, MD  DOB: Jun 10, 1951  DOS: 10/14/2019  Location: Office  MRN: [id number]  Setting: Ambulatory outpatient  Referring Provider: Guadlupe Spanish, MD  Type: Established Patient  Specialty: Interventional Pain Management  PCP: Guadlupe Spanish, MD  Location: Home  Delivery: TeleHealth     Virtual Encounter - Pain Management PROVIDER NOTE: Information contained herein reflects review and annotations entered in association with encounter. Interpretation of such information and data should be left to medically-trained personnel. Information provided to patient can be located elsewhere in the medical record under "Patient Instructions". Document created using STT-dictation technology, any transcriptional errors that may result from process are unintentional.    Contact & Pharmacy Preferred: [phone] Home: [phone] (home) Mobile: [phone] (mobile) E-mail: No e-mail address on record  [place], [place] - [address] [place] [address] Phone: [phone] Fax: [phone]  [id number] [place], [place]Phone: [phone]: [phone]  Pre-screening  Tammy Lamb offered "in-person" vs "virtual" encounter. She indicated preferring virtual for this encounter.   Reason COVID-19*  Social distancing based on CDC and AMA recommendations.   I contacted Tammy Lamb on 10/14/2019 via telephone.      I clearly identified myself as BGillis Santa MD. I verified that I was speaking with the correct person using two identifiers (Name: SDAMARYS SPEIR and date of birth: [birth year].  This visit was completed via telephone due to the restrictions of the COVID-19 pandemic. All issues as above were discussed and addressed but no physical exam was performed. If it was felt that the  patient should be evaluated in the office, they were directed there. The patient verbally consented to this visit. Patient was unable to complete an audio/visual visit due to Technical difficulties and/or Lack of internet. Due to the catastrophic nature of the COVID-19 pandemic, this visit was done through audio contact only.  Location of the patient: home address (see Epic for details)  Location of the provider: office  Consent I sought verbal advanced consent from [place] interactions. I informed Tammy Lamb of possible security and privacy concerns, risks, and limitations associated with providing "not-in-person" medical evaluation and management services. I also informed Tammy Lamb of the availability of "in-person" appointments. Finally, I informed her that there would be a charge for the virtual visit and that she could be  personally, fully or partially, financially responsible for it. Ms. PJolleyexpressed understanding and agreed to proceed.   Historic Elements   Tammy Lamb is a 69y.o. year old, female patient evaluated today after her last contact with our practice on 10/13/2019. Tammy Lamb has a past medical history of Anxiety, Arthritis, Back pain, Colitis, ulcerative ([place], COPD (chronic obstructive pulmonary disease) ([place], Depression, GERD (gastroesophageal reflux disease), H/O bladder infections, Headache, Hypertension, Motion sickness, Stroke ([place] (2015), TIA (transient ischemic attack) (2015), Urinary frequency, and Wears dentures. She also  has a past surgical history that includes Tonsillectomy; Cholecystectomy; Abdominal hysterectomy; Diagnostic laparoscopy; Bladder suspension; Colonoscopy; Upper gi endoscopy; Knee arthroscopy (Left, 02/13/2014); Ankle arthroscopy (Left, 03/22/2016); Tendon repair (Left, 03/22/2016); Lumbar laminectomy/decompression microdiscectomy (N/A, 05/30/2017); and Anterior cervical decomp/discectomy fusion (N/A, 04/09/2019). Ms. PMcghiehas a  current medication list which includes the following prescription(s): acetaminophen, amitriptyline, amlodipine, atorvastatin, calcium-vitamin d, vitamin d3, cinnamon, citalopram,  gabapentin, hydrochlorothiazide, hydrocodone-acetaminophen, [START ON 11/04/2019] hydrocodone-acetaminophen, [START ON 12/04/2019] hydrocodone-acetaminophen, hydroxyzine, melatonin, omeprazole-sodium bicarbonate, elmiron, phenylephrine, ramipril, sulfasalazine, tizanidine, ventolin hfa, cephalexin, nitrofurantoin (macrocrystal-monohydrate), oxybutynin, oxybutynin, phenazopyridine, tolterodine, and trimethoprim. She  reports that she has been smoking. She has a 24.50 pack-year smoking history. She has never used smokeless tobacco. She reports current alcohol use. She reports that she does not use drugs. Tammy Lamb is allergic to sulfa antibiotics.   HPI  Today, she is being contacted for discuss MRI results  Pharmacotherapy Assessment  Analgesic:  06/02/2019  1   04/29/2019  Hydrocodone-Acetamin 7.5-325  60.00  30 Bi Lat   [id number]   Nor (9414)   0  15.00 MME  Private Pay   [place]        Monitoring: [place] PMP: PDMP reviewed during this encounter.       Pharmacotherapy: No side-effects or adverse reactions reported. Compliance: No problems identified. Effectiveness: Clinically acceptable. Plan: Refer to "POC".  UDS:  Summary  Date Value Ref Range Status  09/23/2019 Note  Final    Comment:    ==================================================================== ToxASSURE Select 13 (MW) ==================================================================== Test                             Result       Flag       Units Drug Present and Declared for Prescription Verification   Hydrocodone                    428          EXPECTED   ng/mg creat   Norhydrocodone                 1180         EXPECTED   ng/mg creat    Sources of hydrocodone include scheduled prescription medications.    Norhydrocodone is an expected metabolite of  hydrocodone. ==================================================================== Test                      Result    Flag   Units      Ref Range   Creatinine              25               mg/dL      >=20 ==================================================================== Declared Medications:  The flagging and interpretation on this report are based on the  following declared medications.  Unexpected results may arise from  inaccuracies in the declared medications.  **Note: The testing scope of this panel includes these medications:  Hydrocodone  **Note: The testing scope of this panel does not include the  following reported medications:  Acetaminophen  Albuterol  Amitriptyline  Amlodipine  Cholecalciferol  Cinnamon  Citalopram  Gabapentin  Hydrochlorothiazide  Hydroxyzine  Melatonin  Nitrofurantoin (Macrobid)  Omeprazole  Oxybutynin  Phenazopyridine (Pyridium)  Phenylephrine  Prednisone  Ramipril  Sodium Bicarbonate  Sulfasalazine  Tizanidine  Tolterodine  Trimethoprim ==================================================================== For clinical consultation, please call [phone]. ====================================================================    Laboratory Chemistry Profile   Renal Lab Results  Component Value Date   BUN 10 04/02/2019   CREATININE 0.80 10/10/2019   GFRAA >60 04/02/2019   GFRNONAA >60 04/02/2019    Hepatic No results found for: AST, ALT, ALBUMIN, ALKPHOS, HCVAB, AMYLASE, LIPASE, AMMONIA  Electrolytes Lab Results  Component Value Date   NA 134 (L) 04/02/2019   K 3.9 04/02/2019  CL 93 (L) 04/02/2019   CALCIUM 9.2 04/02/2019    Bone No results found for: VD25OH, ZH299ME2AST, MH9622WL7, LG9211HE1, 25OHVITD1, 25OHVITD2, 25OHVITD3, TESTOFREE, TESTOSTERONE  Inflammation (CRP: Acute Phase) (ESR: Chronic Phase) No results found for: CRP, ESRSEDRATE, LATICACIDVEN    Note: Above Lab results reviewed.  Imaging  MR CERVICAL  SPINE W WO CONTRAST CLINICAL DATA:  Neck pain with left radiculopathy, history of surgery  EXAM: MRI CERVICAL SPINE WITHOUT AND WITH CONTRAST  TECHNIQUE: Multiplanar and multiecho pulse sequences of the cervical spine, to include the craniocervical junction and cervicothoracic junction, were obtained without and with intravenous contrast.  CONTRAST:  82m GADAVIST GADOBUTROL 1 MMOL/ML IV SOLN  COMPARISON:  10/08/2018  FINDINGS: Alignment: No significant listhesis.  Vertebrae: Interval anterior fusion at C3-C4 with plate and screw fixation and interbody graft. Hardware is not well evaluated on this study and there is associated susceptibility artifact. Vertebral body heights are maintained. There is persistent marrow edema at the dens, anterior arch of C1, and anterior aspect of the lateral masses C1.  Cord: No abnormal signal.  Posterior Fossa, vertebral arteries, paraspinal tissues: Unremarkable.  Disc levels:  C2-C3: Small central disc protrusion. No significant stenosis. Appearance is similar.  C3-C4: Interval fusion. Endplate osteophytes and mild uncovertebral and facet hypertrophy. Canal has been decompressed. Decreased foraminal stenosis.  C4-C5: Mild uncovertebral and facet hypertrophy. No canal stenosis. Minor right and mild left foraminal stenosis. Appearance is similar.  C5-C6: Mild facet and uncovertebral hypertrophy. No canal or foraminal stenosis. Appearance is similar.  C6-C7: Disc bulge with endplate osteophytic ridging and left greater than right uncovertebral hypertrophy. No canal or right foraminal stenosis. Moderate to marked left foraminal stenosis. Appearance is similar.  C7-T1:  No canal or foraminal stenosis.  IMPRESSION: Interval postoperative changes at C3-C4 with decreased stenosis at that level. Otherwise similar appearance of multilevel degenerative changes with persistent left foraminal stenosis at C6-C7.  Electronically Signed    By: PMacy MisM.D.   On: 10/11/2019 20:53  Assessment  The primary encounter diagnosis was S/P cervical spinal fusion (C3-C4 ACDF (03/2019). Diagnoses of Inflammatory spondylopathy of cervical region ([facility], DDD (degenerative disc disease), cervical, Cervicalgia, and Chronic pain syndrome were also pertinent to this visit.  Plan of Care  Tammy Lamb has a current medication list which includes the following long-term medication(s): amlodipine, atorvastatin, calcium-vitamin d, citalopram, gabapentin, hydrochlorothiazide, omeprazole-sodium bicarbonate, ramipril, and sulfasalazine.  Increase Gabapentin to 600 mg qhs. Continue 300 mg qAM.   Follow-up plan:   Return for Keep sch. appt.      Recent Visits Date Type Provider Dept  09/22/19 Office Visit LGillis Santa MD [facility]  Showing recent visits within past 90 days and meeting all other requirements   Today's Visits Date Type Provider Dept  10/14/19 Office Visit LGillis Santa MD [facility]  Showing today's visits and meeting all other requirements   Future Appointments Date Type Provider Dept  12/23/19 Appointment LGillis Santa MD [facility]  Showing future appointments within next 90 days and meeting all other requirements   I discussed the assessment and treatment plan with the patient. The patient was provided an opportunity to ask questions and all were answered. The patient agreed with the plan and demonstrated an understanding of the instructions.  Patient advised to call back or seek an in-person evaluation if the symptoms or condition worsens.  Duration of encounter:15 minutes.  Note by: BGillis Santa MD Date: 10/14/2019; Time: 1:32 PM

## 2019-11-14 ENCOUNTER — Other Ambulatory Visit: Payer: Self-pay | Admitting: Neurosurgery

## 2019-11-14 DIAGNOSIS — R2689 Other abnormalities of gait and mobility: Secondary | ICD-10-CM

## 2019-11-14 DIAGNOSIS — Z981 Arthrodesis status: Secondary | ICD-10-CM

## 2019-12-02 ENCOUNTER — Other Ambulatory Visit: Payer: Self-pay

## 2019-12-02 ENCOUNTER — Ambulatory Visit
Admission: RE | Admit: 2019-12-02 | Discharge: 2019-12-02 | Disposition: A | Discharge status: Home / Self Care | Payer: Medicare Other | Source: Ambulatory Visit | Attending: Neurosurgery | Admitting: Neurosurgery

## 2019-12-02 DIAGNOSIS — Z981 Arthrodesis status: Secondary | ICD-10-CM | POA: Diagnosis present

## 2019-12-02 DIAGNOSIS — R2689 Other abnormalities of gait and mobility: Secondary | ICD-10-CM

## 2019-12-14 ENCOUNTER — Other Ambulatory Visit: Payer: Self-pay | Admitting: Student in an Organized Health Care Education/Training Program

## 2019-12-14 DIAGNOSIS — G894 Chronic pain syndrome: Secondary | ICD-10-CM

## 2019-12-23 ENCOUNTER — Encounter: Payer: Self-pay | Admitting: Student in an Organized Health Care Education/Training Program

## 2019-12-23 ENCOUNTER — Ambulatory Visit
Payer: Medicare Other | Attending: Student in an Organized Health Care Education/Training Program | Admitting: Student in an Organized Health Care Education/Training Program

## 2019-12-23 ENCOUNTER — Other Ambulatory Visit: Payer: Self-pay

## 2019-12-23 VITALS — BP 135/91 | HR 74 | Temp 97.2°F | Resp 18 | Ht 66.0 in | Wt 208.0 lb

## 2019-12-23 DIAGNOSIS — M4692 Unspecified inflammatory spondylopathy, cervical region: Secondary | ICD-10-CM | POA: Diagnosis present

## 2019-12-23 DIAGNOSIS — Z981 Arthrodesis status: Secondary | ICD-10-CM | POA: Diagnosis present

## 2019-12-23 DIAGNOSIS — M961 Postlaminectomy syndrome, not elsewhere classified: Secondary | ICD-10-CM | POA: Diagnosis present

## 2019-12-23 DIAGNOSIS — M542 Cervicalgia: Secondary | ICD-10-CM

## 2019-12-23 DIAGNOSIS — M5412 Radiculopathy, cervical region: Secondary | ICD-10-CM | POA: Diagnosis present

## 2019-12-23 DIAGNOSIS — M4302 Spondylolysis, cervical region: Secondary | ICD-10-CM | POA: Diagnosis present

## 2019-12-23 DIAGNOSIS — M47812 Spondylosis without myelopathy or radiculopathy, cervical region: Secondary | ICD-10-CM | POA: Diagnosis present

## 2019-12-23 DIAGNOSIS — G894 Chronic pain syndrome: Secondary | ICD-10-CM

## 2019-12-23 DIAGNOSIS — M5030 Other cervical disc degeneration, unspecified cervical region: Secondary | ICD-10-CM | POA: Diagnosis present

## 2019-12-23 DIAGNOSIS — G8929 Other chronic pain: Secondary | ICD-10-CM

## 2019-12-23 MED ORDER — HYDROCODONE-ACETAMINOPHEN 7.5-325 MG PO TABS: 1.0000 | ORAL_TABLET | Freq: Three times a day (TID) | ORAL | 0 refills | Status: DC | PRN

## 2019-12-23 NOTE — Progress Notes (Signed)
Nursing Pain Medication Assessment:  Safety precautions to be maintained throughout the outpatient stay will include: orient to surroundings, keep bed in low position, maintain call bell within reach at all times, provide assistance with transfer out of bed and ambulation.  Medication Inspection Compliance: Pill count conducted under aseptic conditions, in front of the patient. Neither the pills nor the bottle was removed from the patient's sight at any time. Once count was completed pills were immediately returned to the patient in their original bottle.  Medication: Hydrocodone/APAP Pill/Patch Count: 38 of 60 pills remain Pill/Patch Appearance: Markings consistent with prescribed medication Bottle Appearance: Standard pharmacy container. Clearly labeled. Filled Date05 / 20 / 2021 Last Medication intake:  Today

## 2019-12-23 NOTE — Progress Notes (Signed)
PROVIDER NOTE: Information contained herein reflects review and annotations entered in association with encounter. Interpretation of such information and data should be left to medically-trained personnel. Information provided to patient can be located elsewhere in the medical record under "Patient Instructions". Document created using STT-dictation technology, any transcriptional errors that may result from process are unintentional.    Patient: Tammy Lamb  Service Category: E/M  Provider: Gillis Santa, MD  DOB: 1951/02/05  DOS: 12/23/2019  Referring Provider: Guadlupe Spanish, MD  MRN: [id number]  Setting: Ambulatory outpatient  PCP: Guadlupe Spanish, MD  Type: Established Patient  Specialty: Interventional Pain Management    Location: Office  Delivery: Face-to-face     Primary Reason(s) for Visit: Encounter for prescription drug management. (Level of risk: moderate)  CC: Neck Pain  HPI  Tammy Lamb is a 69 y.o. year old, female patient, who comes today for a medication management evaluation. 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 Her primarily concern today is the Neck Pain  Pain Assessment: Location:   Neck Radiating: radiates down both shoulders Onset: More than a month ago Duration: Chronic pain Quality: Spasm(catching) Severity: 6 /10 (subjective, self-reported pain score)  Effect on ADL: limits activities Timing: Constant Modifying factors: lying down BP: (!)  135/91  HR: 74  Tammy Lamb was last scheduled for an appointment on 12/14/2019 for medication management. During today's appointment we reviewed Tammy Lamb's chronic pain status, as well as her outpatient medication regimen.  Patient presents today for medication management.  She endorses neck pain that radiates down to bilateral shoulders and bilateral hands.  Patient is also having issues with imbalance.  She does feel dizzy at times.  Since her last visit with me, patient has completed CT of cervical spine without contrast and MRI of thoracic spine without contrast, results of which are below.  She is scheduled to see neurology next month for her imbalance issues.  She continues her hydrocodone as prescribed and states that it helps manage her pain to a certain extent and helps her function  The patient  reports no history of drug use. Her body mass index is 33.57 kg/m.  Further details on both, my assessment(s), as well as the proposed treatment plan, please see below.  Controlled Substance Pharmacotherapy Assessment REMS (Risk Evaluation and Mitigation Strategy)  Analgesic: 12/11/2019 1 09/22/2019  Hydrocodone-Acetamin 7.5-325  60.00 [address] [address]" Bi Lat [id number] [address] [address]" [place] (9414) 0 15.00 MME Medicare [place]   Dewayne Shorter, RN  12/23/2019  1:26 PM  Signed Nursing Pain Medication Assessment:  Safety precautions to be maintained throughout the outpatient stay will include: orient to surroundings, keep bed in low position, maintain call bell within reach at all times, provide assistance with transfer out of bed and ambulation.  Medication Inspection Compliance: Pill count conducted under aseptic conditions, in front of the patient. Neither the pills nor the bottle was removed from the patient's sight at any time. Once count was completed pills were immediately returned to the patient in their original bottle.  Medication:  Hydrocodone/APAP Pill/Patch Count: 38 of  60 pills remain Pill/Patch Appearance: Markings consistent with prescribed medication Bottle Appearance: Standard pharmacy container. Clearly labeled. Filled Date05 / 20 / 2021 Last Medication intake:  Today   Pharmacokinetics: Liberation and absorption (onset of action): WNL Distribution (time to peak effect): WNL Metabolism and excretion (duration of action): WNL         Pharmacodynamics: Desired effects: Analgesia: Tammy Lamb reports >50% benefit. Functional ability: Patient reports that medication allows her to accomplish basic ADLs Clinically meaningful improvement in function (CMIF): Sustained CMIF goals met Perceived effectiveness: Described as relatively effective, allowing for increase in activities of daily living (ADL) Undesirable effects: Side-effects or Adverse reactions: None reported Monitoring: [place] PMP: PDMP not reviewed this encounter. Online review of the past 87-monthperiod conducted. Compliant with practice rules and regulations Last UDS on record: Summary  Date Value Ref Range Status  09/23/2019 Note  Final    Comment:    ==================================================================== [employer] Select 13 (MW) ==================================================================== Test                             Result       Flag       Units Drug Present and Declared for Prescription Verification   Hydrocodone                    428          EXPECTED   ng/mg creat   Norhydrocodone                 1180         EXPECTED   ng/mg creat    Sources of hydrocodone include scheduled prescription medications.    Norhydrocodone is an expected metabolite of hydrocodone. ==================================================================== Test                      Result    Flag   Units      Ref Range   Creatinine              25               mg/dL       >=20 ==================================================================== Declared Medications:  The flagging and interpretation on this report are based on the  following declared medications.  Unexpected results may arise from  inaccuracies in the declared medications.  **Note: The testing scope of this panel includes these medications:  Hydrocodone  **Note: The testing scope of this panel does not include the  following reported medications:  Acetaminophen  Albuterol  Amitriptyline  Amlodipine  Cholecalciferol  Cinnamon  Citalopram  Gabapentin  Hydrochlorothiazide  Hydroxyzine  Melatonin  Nitrofurantoin (Macrobid)  Omeprazole  Oxybutynin  Phenazopyridine (Pyridium)  Phenylephrine  Prednisone  Ramipril  Sodium Bicarbonate  Sulfasalazine  Tizanidine  Tolterodine  Trimethoprim ==================================================================== For clinical consultation, please call [phone]====    UDS interpretation: Compliant          Medication Assessment Form: Reviewed. Patient indicates being compliant with therapy Treatment compliance: Compliant Risk Assessment Profile: Aberrant behavior: See initial evaluations. None observed or detected today Comorbid factors increasing risk of overdose: See initial evaluation. No additional risks detected today Opioid risk tool (ORT):  Opioid Risk  12/23/2019  Alcohol 0  Illegal Drugs 0  Rx Drugs 0  Alcohol 0  Illegal Drugs 0  Rx Drugs 0  Age between 16-45 years  0  History of Preadolescent Sexual Abuse 0  Psychological Disease 0  ADD -  OCD -  Bipolar -  Depression 1  Opioid Risk Tool Scoring 1  Opioid Risk Interpretation Low Risk    ORT Scoring interpretation table:  Score <3 = Low Risk for SUD  Score between 4-7 = Moderate Risk for SUD  Score >8 = High Risk for Opioid Abuse   Risk of substance use disorder (SUD): Low  Risk Mitigation  Strategies:  Patient Counseling: Covered Patient-Prescriber Agreement (PPA): Present and active  Notification to other healthcare providers: Done  Pharmacologic Plan: No change in therapy, at this time.             Laboratory Chemistry Profile   Renal Lab Results  Component Value Date   BUN 10 04/02/2019   CREATININE 0.80 10/10/2019   GFRAA >60 04/02/2019   GFRNONAA >60 04/02/2019   PROTEINUR NEGATIVE 09/20/2018     Electrolytes Lab Results  Component Value Date   NA 134 (L) 04/02/2019   K 3.9 04/02/2019   CL 93 (L) 04/02/2019   CALCIUM 9.2 04/02/2019     Hepatic No results found for: AST, ALT, ALBUMIN, ALKPHOS, AMYLASE, LIPASE, AMMONIA   ID Lab Results  Component Value Date   SARSCOV2NAA NEGATIVE 04/07/2019   STAPHAUREUS NEGATIVE 04/02/2019   MRSAPCR NEGATIVE 04/02/2019     Bone No results found for: VD25OH, VD125OH2TOT, IW9798XQ1, JH4174YC1, 25OHVITD1, 25OHVITD2, 25OHVITD3, TESTOFREE, TESTOSTERONE   Endocrine Lab Results  Component Value Date   GLUCOSE 101 (H) 04/02/2019   GLUCOSEU NEGATIVE 09/20/2018     Neuropathy No results found for: VITAMINB12, FOLATE, HGBA1C, HIV   CNS No results found for: COLORCSF, APPEARCSF, RBCCOUNTCSF, WBCCSF, POLYSCSF, LYMPHSCSF, EOSCSF, PROTEINCSF, GLUCCSF, JCVIRUS, CSFOLI, IGGCSF, LABACHR, ACETBL, LABACHR, ACETBL   Inflammation (CRP: Acute  ESR: Chronic) No results found for: CRP, ESRSEDRATE, LATICACIDVEN   Rheumatology No results found for: RF, ANA, LABURIC, URICUR, LYMEIGGIGMAB, LYMEABIGMQN, HLAB27   Coagulation Lab Results  Component Value Date   INR 1.1 04/02/2019   LABPROT 13.8 04/02/2019   APTT 34 04/02/2019   PLT 352 04/02/2019     Cardiovascular Lab Results  Component Value Date   TROPONINI <0.03 09/20/2018   HGB 12.4 04/02/2019   HCT 37.5 04/02/2019     Screening Lab Results  Component Value Date   SARSCOV2NAA NEGATIVE 04/07/2019   STAPHAUREUS NEGATIVE 04/02/2019   MRSAPCR NEGATIVE 04/02/2019      Cancer No results found for: CEA, CA125, LABCA2   Allergens No results found for: ALMOND, APPLE, ASPARAGUS, AVOCADO, BANANA, BARLEY, BASIL, BAYLEAF, GREENBEAN, LIMABEAN, WHITEBEAN, BEEFIGE, REDBEET, BLUEBERRY, BROCCOLI, CABBAGE, MELON, CARROT, CASEIN, CASHEWNUT, CAULIFLOWER, CELERY     Note: Lab results reviewed.   Recent Diagnostic Imaging Results  MR THORACIC SPINE WO CONTRAST CLINICAL DATA:  69 year old female status post fusion in September last year. Mid back pain for 3 months. Bilateral shoulder and arm pain. Left side pain radiating to the hip and leg.  EXAM: MRI THORACIC SPINE WITHOUT CONTRAST  TECHNIQUE: Multiplanar, multisequence MR imaging of the thoracic spine was performed. No intravenous contrast was administered.  COMPARISON:  Cervical spine CT today reported separately. Cervical spine MRI 10/10/2019.  FINDINGS: Limited cervical spine imaging:  Reported separately today.  Thoracic spine segmentation:  Appears to be normal.  Alignment: Mildly exaggerated thoracic kyphosis. Subtle anterolisthesis at T2-T3 and T3-T4.  Vertebrae: Mild degenerative appearing anterior endplate marrow edema at T8-T9. Normal background bone marrow signal.  Cord: No thoracic spinal cord signal abnormality despite multilevel mild degenerative cord mass effect,  detailed below. Fairly capacious underlying thoracic spinal canal. The conus medullaris appears normal at L1.  Paraspinal and other soft tissues: Moderate to large gastric hiatal hernia is visible on series 20, image 27. Otherwise negative visible thoracic and upper abdominal viscera. Negative thoracic paraspinal soft tissues.  Disc levels:  T1-T2: Negative.  T2-T3: Negative aside from mild facet hypertrophy.  T3-T4: Negative aside from mild to moderate facet hypertrophy.  T4-T5: Negative.  T5-T6: Small to moderate central disc protrusion (series 20, image 17) with mild ventral cord mass effect despite patent dorsal  CSF space. No foraminal involvement or stenosis.  T6-T7: Similar size right paracentral disc protrusion (series 20, image 20) with mild right hemi cord mass effect. Mild facet hypertrophy. No foraminal stenosis.  T7-T8: Similar size right paracentral disc protrusion (series 20, image 23). With mild ventral cord mass effect. No other stenosis.  T8-T9: Slightly larger left paracentral disc protrusion or extrusion (series 17, image 9 and series 20, image 26) with similar mild ventral cord mass effect despite patent dorsal CSF space. Mild facet hypertrophy. No foraminal stenosis.  T9-T10: Mild disc bulging with broad-based posterior component. Mild facet hypertrophy but no stenosis.  T10-T11: Mild disc bulging. Mild to moderate facet hypertrophy. No significant stenosis.  T11-T12: Minimal disc bulge. Mild to moderate facet hypertrophy. Mild to moderate right T11 foraminal stenosis.  T12-L1: Minimal disc bulge.  No stenosis.  IMPRESSION: 1. Fairly capacious thoracic spinal canal, although small to moderate disc herniations from T5-T6 through T8-T9 result in mild ventral spinal cord mass effect. No associated cord signal abnormality.  2. No other thoracic spinal stenosis. There is mild to moderate right T11 neural foraminal stenosis in part due to facet hypertrophy.  3. Mild degenerative endplate marrow edema at T8-T9.  Electronically Signed   By: Genevie Ann M.D.   On: 12/02/2019 16:32 CT CERVICAL SPINE WO CONTRAST CLINICAL DATA:  69 year old female status post fusion in September last year. Right side neck pain. Left arm numbness and tingling.  EXAM: CT CERVICAL SPINE WITHOUT CONTRAST  TECHNIQUE: Multidetector CT imaging of the cervical spine was performed without intravenous contrast. Multiplanar CT image reconstructions were also generated.  COMPARISON:  Postoperative cervical spine MRI 10/10/2019.  FINDINGS: Alignment: Stable cervical lordosis from the March  MRI. Cervicothoracic junction alignment is within normal limits. Bilateral posterior element alignment is within normal limits.  Skull base and vertebrae: Postoperative details below. Visualized skull base is intact. No atlanto-occipital dissociation. No acute osseous abnormality identified.  Soft tissues and spinal canal: Negative visible neck soft tissues aside from mild calcified carotid atherosclerosis. There is also calcified distal vertebral artery atherosclerosis at the skull base.  Disc levels:  C1-C2: Anterior joint space loss and osteophytosis.  C2-C3:  Disc bulging as seen by MRI.  C3-C4: Prior ACDF with no evidence of hardware loosening and solid appearing interbody arthrodesis (series 6, image 23). Residual endplate spurring most affecting the right C4 neural foramen (sagittal series 6, image 18).  C4-C5: Subtle anterolisthesis. Endplate spurring and mild facet hypertrophy with mild to moderate left C5 foraminal stenosis.  C5-C6:  Negative.  C6-C7: Disc bulging better demonstrated by MRI. Mild endplate spurring mostly affecting the left C7 neural foramen (sagittal image 31).  C7-T1:  Mild facet hypertrophy on the left.  Upper chest: Evidence of centrilobular emphysema in the lung apices. Visible upper thoracic levels appear intact.  Other: Negative visible noncontrast posterior fossa.  IMPRESSION: 1. C3-C4 ACDF with solid interbody arthrodesis. Residual endplate spurring affects the right  C4 neural foramen.  2. No acute osseous abnormality and generally mild for age cervical spine degeneration elsewhere appears stable from the March MRI.  Electronically Signed   By: Genevie Ann M.D.   On: 12/02/2019 16:24  Complexity Note: Imaging results reviewed. Results shared with Tammy Lamb, using Layman's terms.                               Meds   Current Outpatient Medications:  .  acetaminophen (TYLENOL) 500 MG tablet, Take 1,000 mg by mouth at bedtime as  needed (pain)., Disp: , Rfl:  .  amitriptyline (ELAVIL) 25 MG tablet, Take 25 mg by mouth at bedtime., Disp: , Rfl:  .  amLODipine (NORVASC) 5 MG tablet, Take 5 mg by mouth every evening. , Disp: , Rfl:  .  atorvastatin (LIPITOR) 80 MG tablet, Take 80 mg by mouth daily at 6 PM., Disp: , Rfl:  .  calcium-vitamin D (OSCAL WITH D) 500-200 MG-UNIT tablet, Take 1 tablet by mouth daily., Disp: , Rfl:  .  cephALEXin (KEFLEX) 250 MG capsule, Take 250 mg by mouth daily., Disp: , Rfl:  .  Cholecalciferol (VITAMIN D3) 2000 units capsule, Take 2,000 Units by mouth at bedtime., Disp: , Rfl:  .  Cinnamon 500 MG TABS, Take 2 tablets by mouth daily., Disp: , Rfl:  .  citalopram (CELEXA) 20 MG tablet, Take 20 mg by mouth every evening., Disp: , Rfl:  .  gabapentin (NEURONTIN) 300 MG capsule, 300 mg day, 600 mg qhs, Disp: 90 capsule, Rfl: 2 .  hydrochlorothiazide (HYDRODIURIL) 25 MG tablet, Take 25 mg by mouth daily., Disp: , Rfl:  .  [START ON 01/10/2020] HYDROcodone-acetaminophen (NORCO) 7.5-325 MG tablet, Take 1 tablet by mouth every 8 (eight) hours as needed for severe pain. Must last 30 days., Disp: 60 tablet, Rfl: 0 .  [START ON 02/09/2020] HYDROcodone-acetaminophen (NORCO) 7.5-325 MG tablet, Take 1 tablet by mouth every 8 (eight) hours as needed for severe pain. Must last 30 days., Disp: 60 tablet, Rfl: 0 .  [START ON 03/10/2020] HYDROcodone-acetaminophen (NORCO) 7.5-325 MG tablet, Take 1 tablet by mouth every 8 (eight) hours as needed for severe pain. Must last 30 days., Disp: 60 tablet, Rfl: 0 .  hydrOXYzine (ATARAX/VISTARIL) 25 MG tablet, Take 25 mg by mouth at bedtime., Disp: , Rfl:  .  Melatonin 10 MG TABS, Take 10 mg by mouth at bedtime. , Disp: , Rfl:  .  nitrofurantoin, macrocrystal-monohydrate, (MACROBID) 100 MG capsule, Take 100 mg by mouth daily., Disp: , Rfl:  .  Omeprazole-Sodium Bicarbonate (ZEGERID OTC) 20-1100 MG CAPS capsule, Take 1 capsule by mouth 2 (two) times daily., Disp: , Rfl:  .   oxybutynin (DITROPAN) 5 MG tablet, Take 5 mg by mouth 3 (three) times daily., Disp: , Rfl:  .  pentosan polysulfate (ELMIRON) 100 MG capsule, TAKE 1 CAPSULE BY MOUTH 2 TIMES DAILY. NOT COVERED BY INSURANCE, Disp: , Rfl:  .  phenazopyridine (PYRIDIUM) 200 MG tablet, Take 200 mg by mouth 3 (three) times daily as needed., Disp: , Rfl:  .  phenylephrine (NEO-SYNEPHRINE) 1 % nasal spray, Place 1 drop into both nostrils daily as needed for congestion., Disp: , Rfl:  .  ramipril (ALTACE) 10 MG capsule, Take 10 mg by mouth daily., Disp: , Rfl:  .  sulfaSALAzine (AZULFIDINE) 500 MG tablet, Take 500 mg by mouth 2 (two) times daily., Disp: , Rfl:  .  tiZANidine (  ZANAFLEX) 4 MG tablet, Take 1 tablet (4 mg total) by mouth every 8 (eight) hours as needed., Disp: 90 tablet, Rfl: 2 .  VENTOLIN HFA 108 (90 Base) MCG/ACT inhaler, Inhale 2 puffs into the lungs every 6 (six) hours as needed for wheezing or shortness of breath. , Disp: , Rfl:  .  oxybutynin (DITROPAN-XL) 5 MG 24 hr tablet, Take 5 mg by mouth 2 (two) times daily., Disp: , Rfl:  .  tolterodine (DETROL LA) 4 MG 24 hr capsule, Take 4 mg by mouth daily., Disp: , Rfl:  .  trimethoprim (TRIMPEX) 100 MG tablet, 1 po qd, Disp: , Rfl:   ROS  Constitutional: Denies any fever or chills Gastrointestinal: No reported hemesis, hematochezia, vomiting, or acute GI distress Musculoskeletal: Denies any acute onset joint swelling, redness, loss of ROM, or weakness Neurological: No reported episodes of acute onset apraxia, aphasia, dysarthria, agnosia, amnesia, paralysis, loss of coordination, or loss of consciousness  Allergies  Tammy Lamb is allergic to sulfa antibiotics.  PFSH  Drug: Tammy Lamb  reports no history of drug use. Alcohol:  reports current alcohol use. Tobacco:  reports that she has been smoking. She has a 24.50 pack-year smoking history. She has never used smokeless tobacco. Medical:  has a past medical history of Anxiety, Arthritis, Back pain,  Colitis, ulcerative ([place]), COPD (chronic obstructive pulmonary disease) ([place]), Depression, GERD (gastroesophageal reflux disease), H/O bladder infections, Headache, Hypertension, Motion sickness, Stroke ([place]) (2015), TIA (transient ischemic attack) (2015), Urinary frequency, and Wears dentures. Surgical: Tammy Lamb  has a past surgical history that includes Tonsillectomy; Cholecystectomy; Abdominal hysterectomy; Diagnostic laparoscopy; Bladder suspension; Colonoscopy; Upper gi endoscopy; Knee arthroscopy (Left, 02/13/2014); Ankle arthroscopy (Left, 03/22/2016); Tendon repair (Left, 03/22/2016); Lumbar laminectomy/decompression microdiscectomy (N/A, 05/30/2017); and Anterior cervical decomp/discectomy fusion (N/A, 04/09/2019). Family: family history includes COPD in her father; Diabetes in her mother; Heart disease in her father; Hypertension in her father and mother.  Constitutional Exam  General appearance: Well nourished, well developed, and well hydrated. In no apparent acute distress Vitals:   12/23/19 1317  BP: (!) 135/91  Pulse: 74  Resp: 18  Temp: (!) 97.2 F (36.2 C)  SpO2: 100%  Weight: 208 lb (94.3 kg)  Height: 5' 6"  (1.676 m)   BMI Assessment: Estimated body mass index is 33.57 kg/m as calculated from the following:   Height as of this encounter: 5' 6"  (1.676 m).   Weight as of this encounter: 208 lb (94.3 kg).  BMI interpretation table: BMI level Category Range association with higher incidence of chronic pain  <18 kg/m2 Underweight   18.5-24.9 kg/m2 Ideal body weight   25-29.9 kg/m2 Overweight Increased incidence by 20%  30-34.9 kg/m2 Obese (Class I) Increased incidence by 68%  35-39.9 kg/m2 Severe obesity (Class II) Increased incidence by 136%  >40 kg/m2 Extreme obesity (Class III) Increased incidence by 254%   Patient's current BMI Ideal Body weight  Body mass index is 33.57 kg/m. Ideal body weight: 59.3 kg (130 lb 11.7 oz) Adjusted ideal body weight: 73.3 kg (161 lb 10.2  oz)   BMI Readings from Last 4 Encounters:  12/23/19 33.57 kg/m  09/22/19 33.41 kg/m  04/02/19 36.24 kg/m  10/01/18 34.95 kg/m   Wt Readings from Last 4 Encounters:  12/23/19 208 lb (94.3 kg)  09/22/19 207 lb (93.9 kg)  04/02/19 217 lb 12.8 oz (98.8 kg)  10/01/18 210 lb (95.3 kg)    Psych/Mental status: Alert, oriented x 3 (person, place, & time)  Eyes: PERLA Respiratory: No evidence of acute respiratory distress  Cervical Spine Exam  Skin & Axial Inspection: Well healed scar from previous spine surgery detected Alignment: Symmetrical Functional ROM: Pain restricted ROM      Stability: No instability detected Muscle Tone/Strength: Functionally intact. No obvious neuro-muscular anomalies detected. Sensory (Neurological): Dermatomal pain pattern Palpation: Complains of area being tender to palpation              Upper Extremity (UE) Exam    Side: Right upper extremity  Side: Left upper extremity  Skin & Extremity Inspection: Skin color, temperature, and hair growth are WNL. No peripheral edema or cyanosis. No masses, redness, swelling, asymmetry, or associated skin lesions. No contractures.  Skin & Extremity Inspection: Skin color, temperature, and hair growth are WNL. No peripheral edema or cyanosis. No masses, redness, swelling, asymmetry, or associated skin lesions. No contractures.  Functional ROM: Unrestricted ROM          Functional ROM: Unrestricted ROM          Muscle Tone/Strength: Functionally intact. No obvious neuro-muscular anomalies detected.  Muscle Tone/Strength: Functionally intact. No obvious neuro-muscular anomalies detected.  Sensory (Neurological): Dermatomal pain pattern          Sensory (Neurological): Dermatomal pain pattern          Palpation: No palpable anomalies              Palpation: No palpable anomalies              Provocative Test(s):  Phalen's test: deferred Tinel's test: deferred Apley's scratch test (touch opposite shoulder):  Action  1 (Across chest): Decreased ROM Action 2 (Overhead): Decreased ROM Action 3 (LB reach): deferred   Provocative Test(s):  Phalen's test: deferred Tinel's test: deferred Apley's scratch test (touch opposite shoulder):  Action 1 (Across chest): Decreased ROM Action 2 (Overhead): Decreased ROM Action 3 (LB reach): deferred    Thoracic Spine Area Exam  Skin & Axial Inspection: No masses, redness, or swelling Alignment: Symmetrical Functional ROM: Unrestricted ROM Stability: No instability detected Muscle Tone/Strength: Functionally intact. No obvious neuro-muscular anomalies detected. Sensory (Neurological): Unimpaired Muscle strength & Tone: No palpable anomalies  Lumbar Exam  Skin & Axial Inspection: Well healed scar from previous spine surgery detected Alignment: Symmetrical Functional ROM: Pain restricted ROM       Stability: No instability detected Muscle Tone/Strength: Functionally intact. No obvious neuro-muscular anomalies detected. Sensory (Neurological): Dermatomal pain pattern  Gait & Posture Assessment  Ambulation: Limited Gait: Age-related, senile gait pattern Posture: WNL   Lower Extremity Exam    Side: Right lower extremity  Side: Left lower extremity  Stability: No instability observed          Stability: No instability observed          Skin & Extremity Inspection: Skin color, temperature, and hair growth are WNL. No peripheral edema or cyanosis. No masses, redness, swelling, asymmetry, or associated skin lesions. No contractures.  Skin & Extremity Inspection: Skin color, temperature, and hair growth are WNL. No peripheral edema or cyanosis. No masses, redness, swelling, asymmetry, or associated skin lesions. No contractures.  Functional ROM: Pain restricted ROM for hip and knee joints          Functional ROM: Pain restricted ROM for hip and knee joints          Muscle Tone/Strength: Functionally intact. No obvious neuro-muscular anomalies detected.  Muscle  Tone/Strength: Functionally intact. No obvious neuro-muscular anomalies detected.  Sensory (Neurological): Dermatomal pain  pattern        Sensory (Neurological): Dermatomal pain pattern        DTR: Patellar: deferred today Achilles: deferred today Plantar: deferred today  DTR: Patellar: deferred today Achilles: deferred today Plantar: deferred today  Palpation: No palpable anomalies  Palpation: No palpable anomalies   Assessment   Status Diagnosis  Persistent Persistent Persistent 1. S/P cervical spinal fusion (C3-C4 ACDF (03/2019)   2. Chronic pain syndrome   3. 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 DDD (degenerative disc disease), cervical   5. Cervicalgia   6. Cervical facet joint syndrome   7. Cervical spondylolysis   8. Chronic radicular cervical pain   9. 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 HYDROcodone-acetaminophen (NORCO) 7.5-325 MG tablet    Sig: Take 1 tablet by mouth every 8 (eight) hours as needed for severe pain. Must last 30 days.    Dispense:  60 tablet    Refill:  0    [place] STOP ACT - Not applicable. Fill one day early if pharmacy is closed on scheduled refill date.  Marland Kitchen HYDROcodone-acetaminophen (NORCO) 7.5-325 MG tablet    Sig: Take 1 tablet by mouth every 8 (eight) hours as needed for severe pain. Must last 30 days.    Dispense:  60 tablet    Refill:  0    [place] STOP ACT - Not applicable. Fill one day early if pharmacy is closed on scheduled refill date.  Marland Kitchen HYDROcodone-acetaminophen (NORCO) 7.5-325 MG tablet    Sig: Take 1 tablet by mouth every 8 (eight) hours as needed for severe pain. Must last 30 days.    Dispense:  60 tablet    Refill:  0     STOP ACT - Not applicable. Fill one day early if pharmacy is closed on scheduled refill date.   Medications administered today: Tammy Lamb had no medications  administered during this visit.  Orders:  No orders of the defined types were placed in this encounter.  Planned follow-up:   Return in about 3 months (around 03/24/2020) for Medication Management, in person.    Recent Visits Date Type Provider Dept  10/14/19 Office Visit Gillis Santa, MD [facility]  Showing recent visits within past 90 days and meeting all other requirements   Today's Visits Date Type Provider Dept  12/23/19 Office Visit Gillis Santa, MD [facility]  Showing today's visits and meeting all other requirements   Future Appointments No visits were found meeting these conditions.  Showing future appointments within next 90 days and meeting all other requirements   Primary Care Physician: Guadlupe Spanish, MD Location: [facility] Note by: Gillis Santa, MD Date: 12/23/2019; Time: 2:34 PM  Note: This dictation was prepared with Dragon dictation. Any transcriptional errors that may result from this process are unintentional.

## 2019-12-24 ENCOUNTER — Telehealth: Payer: Self-pay | Admitting: Student in an Organized Health Care Education/Training Program

## 2019-12-24 DIAGNOSIS — G894 Chronic pain syndrome: Secondary | ICD-10-CM

## 2019-12-24 MED ORDER — GABAPENTIN 300 MG PO CAPS: ORAL_CAPSULE | ORAL | 2 refills | Status: DC

## 2019-12-24 NOTE — Telephone Encounter (Signed)
Will you send this in? Last prescribed 10-01-19 with 2 RF

## 2019-12-24 NOTE — Telephone Encounter (Signed)
Patient called to get her meds and pharmacy says they did not receive gabapentin e-script. Please check and have Dr. Holley Raring take care of this.

## 2019-12-24 NOTE — Addendum Note (Signed)
Addended by: Gillis Santa on: 12/24/2019 02:51 PM   Modules accepted: Orders

## 2020-01-28 ENCOUNTER — Other Ambulatory Visit: Payer: Self-pay | Admitting: Neurology

## 2020-01-28 DIAGNOSIS — R2689 Other abnormalities of gait and mobility: Secondary | ICD-10-CM

## 2020-01-28 DIAGNOSIS — G20 Parkinson's disease: Secondary | ICD-10-CM

## 2020-01-30 ENCOUNTER — Other Ambulatory Visit: Payer: Self-pay | Admitting: Student in an Organized Health Care Education/Training Program

## 2020-01-30 DIAGNOSIS — G894 Chronic pain syndrome: Secondary | ICD-10-CM

## 2020-02-04 LAB — COLOGUARD: COLOGUARD: NEGATIVE

## 2020-02-11 ENCOUNTER — Other Ambulatory Visit: Payer: Self-pay

## 2020-02-11 ENCOUNTER — Ambulatory Visit
Admission: RE | Admit: 2020-02-11 | Discharge: 2020-02-11 | Disposition: A | Discharge status: Home / Self Care | Payer: Medicare Other | Source: Ambulatory Visit | Attending: Neurology | Admitting: Neurology

## 2020-02-11 DIAGNOSIS — G20C Parkinsonism, unspecified: Secondary | ICD-10-CM | POA: Diagnosis present

## 2020-02-11 DIAGNOSIS — G20 Parkinson's disease: Secondary | ICD-10-CM | POA: Diagnosis not present

## 2020-02-11 DIAGNOSIS — R2689 Other abnormalities of gait and mobility: Secondary | ICD-10-CM | POA: Diagnosis present

## 2020-03-02 IMAGING — CR DG CERVICAL SPINE 2 OR 3 VIEWS
2 series · 2 of 2 positions shown · non-contrast
Comparison: Fluoroscopic images of same day.

CLINICAL DATA: Status post surgical fusion.

EXAM:
CERVICAL SPINE - 2-3 VIEW

[c-spine lat]
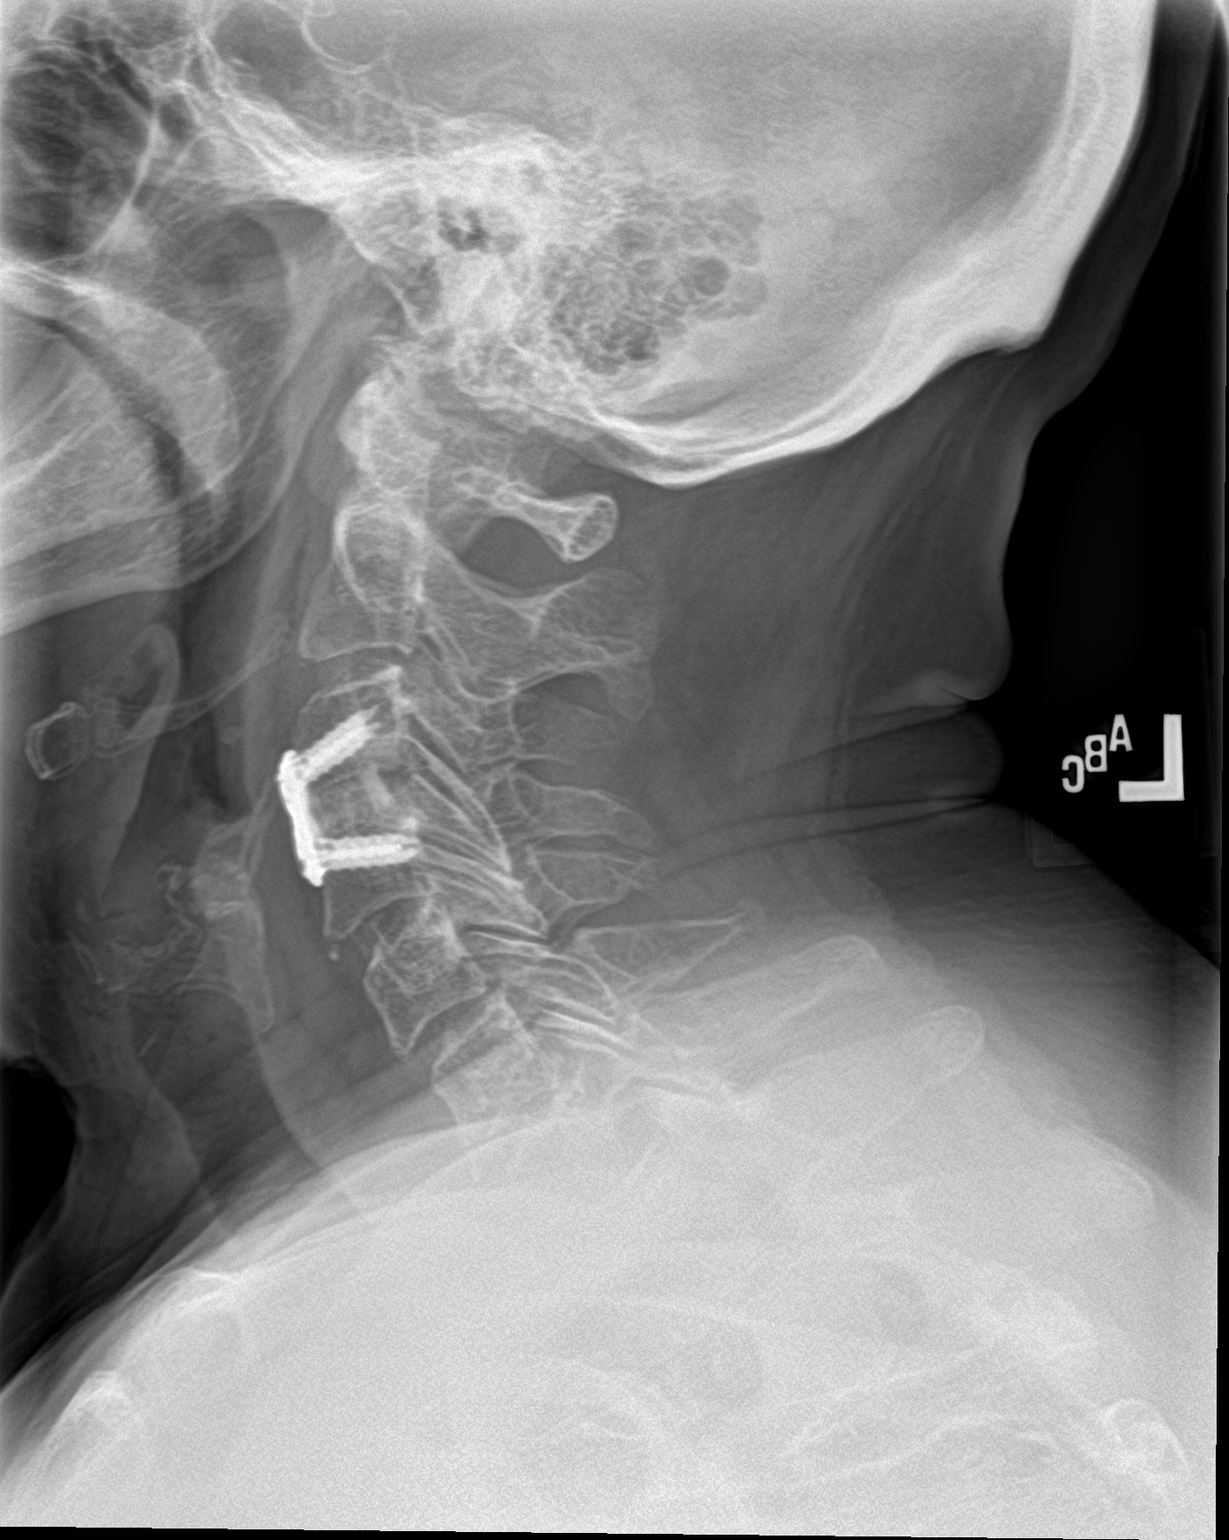

[c-spine ap]
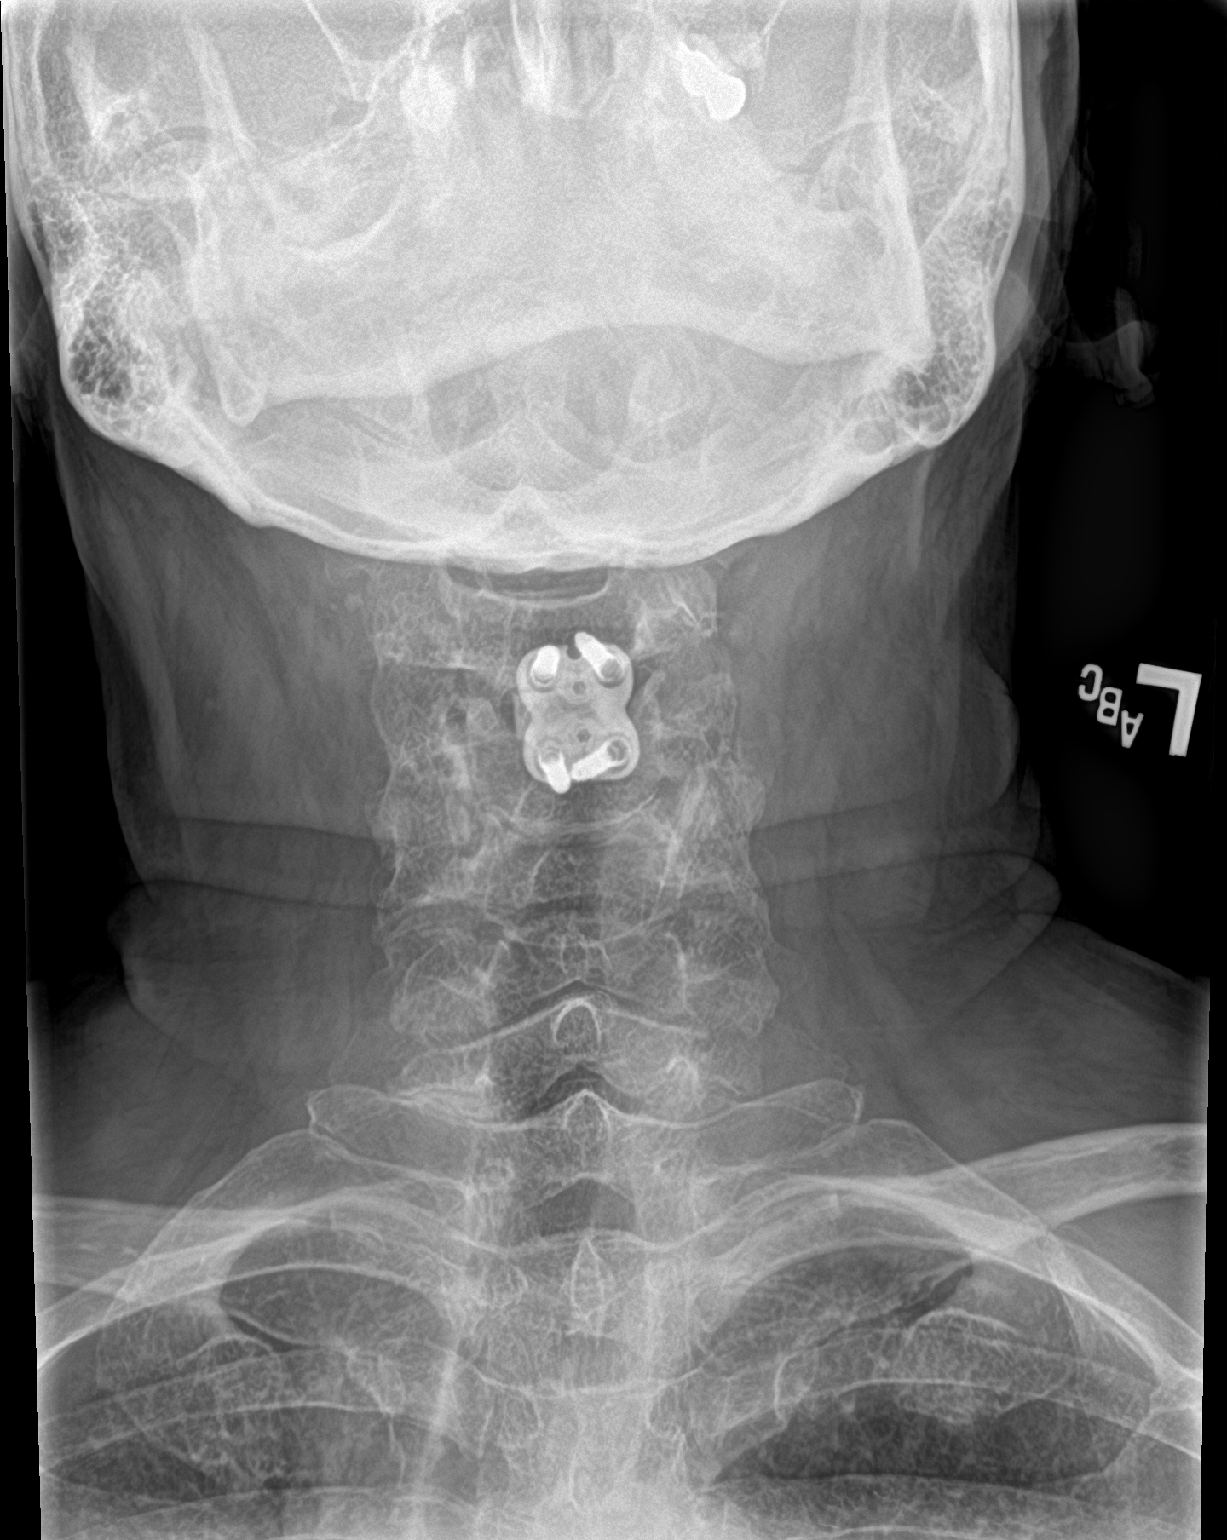

[2 of 2 positions shown; findings below may reference images not displayed]

FINDINGS: Status post surgical anterior fusion of C3-4. Good alignment of
vertebral bodies is noted. No fracture or spondylolisthesis is
noted.
IMPRESSION: Status post surgical anterior fusion of C3-4.

## 2020-03-23 ENCOUNTER — Encounter: Payer: Self-pay | Admitting: Student in an Organized Health Care Education/Training Program

## 2020-03-23 ENCOUNTER — Other Ambulatory Visit: Payer: Self-pay

## 2020-03-23 ENCOUNTER — Ambulatory Visit
Payer: Medicare Other | Attending: Student in an Organized Health Care Education/Training Program | Admitting: Student in an Organized Health Care Education/Training Program

## 2020-03-23 ENCOUNTER — Telehealth: Payer: Self-pay

## 2020-03-23 VITALS — BP 134/95 | HR 88 | Temp 97.4°F | Resp 20 | Ht 66.0 in | Wt 205.0 lb

## 2020-03-23 DIAGNOSIS — M4856XS Collapsed vertebra, not elsewhere classified, lumbar region, sequela of fracture: Secondary | ICD-10-CM | POA: Insufficient documentation

## 2020-03-23 DIAGNOSIS — M48062 Spinal stenosis, lumbar region with neurogenic claudication: Secondary | ICD-10-CM | POA: Diagnosis present

## 2020-03-23 DIAGNOSIS — Z981 Arthrodesis status: Secondary | ICD-10-CM | POA: Insufficient documentation

## 2020-03-23 DIAGNOSIS — G894 Chronic pain syndrome: Secondary | ICD-10-CM | POA: Insufficient documentation

## 2020-03-23 DIAGNOSIS — M5030 Other cervical disc degeneration, unspecified cervical region: Secondary | ICD-10-CM | POA: Diagnosis not present

## 2020-03-23 DIAGNOSIS — M4692 Unspecified inflammatory spondylopathy, cervical region: Secondary | ICD-10-CM | POA: Diagnosis not present

## 2020-03-23 DIAGNOSIS — M5412 Radiculopathy, cervical region: Secondary | ICD-10-CM | POA: Diagnosis present

## 2020-03-23 DIAGNOSIS — M961 Postlaminectomy syndrome, not elsewhere classified: Secondary | ICD-10-CM | POA: Insufficient documentation

## 2020-03-23 DIAGNOSIS — M47812 Spondylosis without myelopathy or radiculopathy, cervical region: Secondary | ICD-10-CM

## 2020-03-23 DIAGNOSIS — M542 Cervicalgia: Secondary | ICD-10-CM

## 2020-03-23 DIAGNOSIS — G8929 Other chronic pain: Secondary | ICD-10-CM | POA: Diagnosis present

## 2020-03-23 DIAGNOSIS — M5416 Radiculopathy, lumbar region: Secondary | ICD-10-CM

## 2020-03-23 MED ORDER — HYDROCODONE-ACETAMINOPHEN 7.5-325 MG PO TABS: 1.0000 | ORAL_TABLET | Freq: Three times a day (TID) | ORAL | 0 refills | Status: DC | PRN

## 2020-03-23 MED ORDER — TIZANIDINE HCL 4 MG PO TABS: 4.0000 mg | ORAL_TABLET | Freq: Three times a day (TID) | ORAL | 2 refills | Status: DC | PRN

## 2020-03-23 MED ORDER — GABAPENTIN 300 MG PO CAPS: ORAL_CAPSULE | ORAL | 2 refills | Status: DC

## 2020-03-23 NOTE — Progress Notes (Signed)
PROVIDER NOTE: Information contained herein reflects review and annotations entered in association with encounter. Interpretation of such information and data should be left to medically-trained personnel. Information provided to patient can be located elsewhere in the medical record under "Patient Instructions". Document created using STT-dictation technology, any transcriptional errors that may result from process are unintentional.    Patient: Tammy Lamb  Service Category: E/M  Provider: Gillis Santa, MD  DOB: [birth year]  DOS: 03/23/2020  Specialty: Interventional Pain Management  MRN: [id number]  Setting: Ambulatory outpatient  PCP: Guadlupe Spanish, MD  Type: Established Patient    Referring Provider: Guadlupe Spanish, MD  Location: Office  Delivery: Face-to-face     HPI  Reason for encounter: Tammy Lamb, a 69 y.o. year old female, is here today for evaluation and management of her S/P cervical spinal fusion [Z98.1]. Tammy Lamb primary complain today is Neck Pain Last encounter: Practice (03/23/2020). My last encounter with her was on 01/30/2020. 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 Pain Assessment: Severity of Chronic pain is reported as a 7 /10. Location: Neck Right, Left/radiates into both shoulders. Onset: More than a month ago. Quality: Aching, Shooting. Timing: Constant. Modifying factor(s): medications, lying down. Vitals:  height is 5' 6"  (1.676 m) and weight is 205 lb (93 kg).  Her temperature is 97.4 F (36.3 C) (abnormal). Her blood pressure is 134/95 (abnormal) and her pulse is 88. Her respiration is 20 and oxygen saturation is 97%.   Increased neck pain, bilateral shoulder pain.  Also having low back and bilateral leg pain.  Here for medication management as below.  Has been seen by neurology with Dr. Manuella Ghazi.  Concern for Parkinson's syndrome.  Continues to have balance issues as well as difficulty with gait.  Has established with new primary care provider at [facility]  Pharmacotherapy Assessment   03/14/2020  1   12/23/2019  Hydrocodone-Acetamin 7.5-325  60.00  30 Bi Lat   [id number]   Nor (1478)   0/0  15.00 MME  Medicare   [place]      Monitoring:  PMP: PDMP reviewed during this encounter.       Pharmacotherapy: No side-effects or adverse reactions reported. Compliance: No problems identified. Effectiveness: Clinically acceptable.  Dewayne Shorter, RN  03/23/2020 11:07 AM  Signed Nursing Pain Medication Assessment:  Safety precautions to be maintained throughout the outpatient stay will include: orient to surroundings, keep bed in low position, maintain call bell within reach at all times, provide assistance with transfer out of bed and ambulation.  Medication Inspection Compliance: Pill count conducted under aseptic conditions, in front of the patient. Neither the pills nor the bottle was removed from the patient's sight at any time. Once count was completed pills were immediately returned to the patient in their original bottle.  Medication: Hydrocodone/APAP Pill/Patch Count: 51 of 60 pills remain Pill/Patch Appearance: Markings consistent with prescribed medication Bottle Appearance: Standard pharmacy container. Clearly labeled. Filled Date:08 / 22 / 2021 Last Medication intake:  Yesterday    UDS:  Summary  Date Value Ref Range Status  09/23/2019 Note  Final    Comment:     ====================================================================  [employer] Select 13 (MW) ==================================================================== Test                             Result       Flag       Units Drug Present and Declared for Prescription Verification   Hydrocodone                    428          EXPECTED   ng/mg creat   Norhydrocodone                 1180         EXPECTED   ng/mg creat    Sources of hydrocodone include scheduled prescription medications.    Norhydrocodone is an expected metabolite of hydrocodone. ==================================================================== Test                      Result    Flag   Units      Ref Range   Creatinine              25               mg/dL      >=20 ==================================================================== Declared Medications:  The flagging and interpretation on this report are based on the  following declared medications.  Unexpected results may arise from  inaccuracies in the declared medications.  **Note: The testing scope of this panel includes these medications:  Hydrocodone  **Note: The testing scope of this panel does not include the  following reported medications:  Acetaminophen  Albuterol  Amitriptyline  Amlodipine  Cholecalciferol  Cinnamon  Citalopram  Gabapentin  Hydrochlorothiazide  Hydroxyzine  Melatonin  Nitrofurantoin (Macrobid)  Omeprazole  Oxybutynin  Phenazopyridine (Pyridium)  Phenylephrine  Prednisone  Ramipril  Sodium Bicarbonate  Sulfasalazine  Tizanidine  Tolterodine  Trimethoprim ==================================================================== For clinical consultation, please call [phone]. ====================================================================      ROS  Constitutional: Denies any fever or chills Gastrointestinal: No reported hemesis, hematochezia, vomiting, or acute GI distress Musculoskeletal: Neck pain, bilateral  shoulder pain, low back pain Neurological: No reported episodes of acute onset apraxia, aphasia, dysarthria, agnosia, amnesia, paralysis, loss of coordination, or loss of consciousness  Medication Review  Cinnamon, HYDROcodone-acetaminophen, Vitamin D3, acetaminophen, albuterol, amLODipine, amitriptyline, atorvastatin, calcium-vitamin D, carbidopa-levodopa, cephALEXin, citalopram, gabapentin, hydrOXYzine, hydrochlorothiazide, metFORMIN, pentosan polysulfate, ramipril, sulfaSALAzine, and tiZANidine  History Review  Allergy: Tammy Lamb is allergic to sulfa antibiotics. Drug: Tammy Lamb  reports no history of drug use. Alcohol:  reports current alcohol use. Tobacco:  reports that she has been smoking. She has a 24.50 pack-year smoking history. She has never used smokeless tobacco. Social: Tammy Lamb  reports that she has been smoking. She has a 24.50 pack-year smoking history. She has never used smokeless tobacco. She reports current alcohol use. She reports that she does not use drugs. Medical:  has a past medical history of Anxiety, Arthritis, Back pain, Colitis, ulcerative ([place]), COPD (chronic obstructive pulmonary disease) ([place]), Depression, Diabetes mellitus without complication ([place]), GERD (gastroesophageal reflux disease), H/O bladder infections, Headache, Hypertension, Motion sickness, Parkinson's disease ([place]), Stroke ([place]) (2015), TIA (transient ischemic attack) (2015), Urinary frequency, and Wears dentures. Surgical: Tammy Lamb  has a past surgical history that includes Tonsillectomy; Cholecystectomy; Abdominal hysterectomy; Diagnostic laparoscopy; Bladder suspension; Colonoscopy; Upper gi endoscopy; Knee arthroscopy (Left, 02/13/2014); Ankle arthroscopy (Left, 03/22/2016); Tendon repair (Left, 03/22/2016); Lumbar laminectomy/decompression microdiscectomy (N/A, 05/30/2017);  and Anterior cervical decomp/discectomy fusion (N/A, 04/09/2019). Family: family history includes COPD in her father; Diabetes in  her mother; Heart disease in her father; Hypertension in her father and mother.  Laboratory Chemistry Profile   Renal Lab Results  Component Value Date   BUN 10 04/02/2019   CREATININE 0.80 10/10/2019   GFRAA >60 04/02/2019   GFRNONAA >60 04/02/2019     Hepatic No results found for: AST, ALT, ALBUMIN, ALKPHOS, HCVAB, AMYLASE, LIPASE, AMMONIA   Electrolytes Lab Results  Component Value Date   NA 134 (L) 04/02/2019   K 3.9 04/02/2019   CL 93 (L) 04/02/2019   CALCIUM 9.2 04/02/2019     Bone No results found for: VD25OH, VD125OH2TOT, TD3220UR4, YH0623JS2, 25OHVITD1, 25OHVITD2, 25OHVITD3, TESTOFREE, TESTOSTERONE   Inflammation (CRP: Acute Phase) (ESR: Chronic Phase) No results found for: CRP, ESRSEDRATE, LATICACIDVEN     Note: Above Lab results reviewed.  Recent Imaging Review  MR BRAIN WO CONTRAST Addendum: ADDENDUM REPORT: 02/27/2020 16:47   ADDENDUM:  Study discussed by telephone with ENT Malon Kindle, M.D. on  02/27/2020 at 1640 hours. He advises that he saw this patient in  relation to the small left retropharyngeal lymph node identified on  this exam, and that his pharyngeal endoscopy was negative.   We discussed that this is most likely a physiologic lymph node, and  in fact I find evidence that this node was present on - and is  stable from - a prior cervical spine MRI 10/08/2018.   Electronically Signed    By: Genevie Ann M.D.    On: 02/27/2020 16:47 Narrative: CLINICAL DATA:  Primary parkinsonism.  Imbalance.  EXAM: MRI HEAD WITHOUT CONTRAST  TECHNIQUE: Multiplanar, multiecho pulse sequences of the brain and surrounding structures were obtained without intravenous contrast.  COMPARISON:  Head CT 09/20/2018  FINDINGS: Brain: No acute infarction, hemorrhage, hydrocephalus, extra-axial collection or mass lesion.  Scattered foci of T2 hyperintensity are seen within the white matter of the cerebral hemispheres, non specific, most likely related to chronic  small vessel ischemia.  Vascular: Normal flow voids.  Skull and upper cervical spine: Normal marrow signal.  Sinuses/Orbits: Negative.  Other: Partially evaluated 10 x 4 mm round structure in the retropharynx on the left, likely a prominent lymph node (series 15, image 1).  IMPRESSION: 1. No acute intracranial abnormality. 2. Mild chronic small vessel ischemia. 3. Partially evaluated 10 x 4 mm round structure in the retropharynx on the left, likely a prominent lymph node.  Electronically Signed: By: Pedro Earls M.D. On: 02/13/2020 14:16 Note: Reviewed        Physical Exam  General appearance: Well nourished, well developed, and well hydrated. In no apparent acute distress Mental status: Alert, oriented x 3 (person, place, & time)       Respiratory: No evidence of acute respiratory distress Eyes: PERLA Vitals: BP (!) 134/95   Pulse 88   Temp (!) 97.4 F (36.3 C)   Resp 20   Ht 5' 6"  (1.676 m)   Wt 205 lb (93 kg)   SpO2 97%   BMI 33.09 kg/m  BMI: Estimated body mass index is 33.09 kg/m as calculated from the following:   Height as of this encounter: 5' 6"  (1.676 m).   Weight as of this encounter: 205 lb (93 kg). Ideal: Ideal body weight: 59.3 kg (130 lb 11.7 oz) Adjusted ideal body weight: 72.8 kg (160 lb 7 oz)   Cervical Spine Exam  Skin & Axial Inspection: Well  healed scar from previous spine surgery detected Alignment: Symmetrical Functional ROM: Pain restricted ROM      Stability: No instability detected Muscle Tone/Strength: Functionally intact. No obvious neuro-muscular anomalies detected. Sensory (Neurological): Dermatomal pain pattern Palpation: Complains of area being tender to palpation                    Upper Extremity (UE) Exam    Side: Right upper extremity  Side: Left upper extremity   Skin & Extremity Inspection: Skin color, temperature, and hair growth are WNL. No peripheral edema or cyanosis. No masses, redness, swelling,  asymmetry, or associated skin lesions. No contractures.  Skin & Extremity Inspection: Skin color, temperature, and hair growth are WNL. No peripheral edema or cyanosis. No masses, redness, swelling, asymmetry, or associated skin lesions. No contractures.   Functional ROM: Unrestricted ROM          Functional ROM: Unrestricted ROM           Muscle Tone/Strength: Functionally intact. No obvious neuro-muscular anomalies detected.  Muscle Tone/Strength: Functionally intact. No obvious neuro-muscular anomalies detected.   Sensory (Neurological): Dermatomal pain pattern          Sensory (Neurological): Dermatomal pain pattern           Palpation: No palpable anomalies              Palpation: No palpable anomalies               Provocative Test(s):  Phalen's test: deferred Tinel's test: deferred Apley's scratch test (touch opposite shoulder):  Action 1 (Across chest): Decreased ROM Action 2 (Overhead): Decreased ROM Action 3 (LB reach): deferred   Provocative Test(s):  Phalen's test: deferred Tinel's test: deferred Apley's scratch test (touch opposite shoulder):  Action 1 (Across chest): Decreased ROM Action 2 (Overhead): Decreased ROM Action 3 (LB reach): deferred     Thoracic Spine Area Exam  Skin & Axial Inspection: No masses, redness, or swelling Alignment: Symmetrical Functional ROM: Unrestricted ROM Stability: No instability detected Muscle Tone/Strength: Functionally intact. No obvious neuro-muscular anomalies detected. Sensory (Neurological): Unimpaired Muscle strength & Tone: No palpable anomalies  Lumbar Exam  Skin & Axial Inspection: Well healed scar from previous spine surgery detected Alignment: Symmetrical Functional ROM: Pain restricted ROM       Stability: No instability detected Muscle Tone/Strength: Functionally intact. No obvious neuro-muscular anomalies detected. Sensory (Neurological): Dermatomal pain pattern  Gait & Posture Assessment  Ambulation:  Limited Gait: Age-related, senile gait pattern Posture: WNL   Lower Extremity Exam    Side: Right lower extremity  Side: Left lower extremity  Stability: No instability observed          Stability: No instability observed          Skin & Extremity Inspection: Skin color, temperature, and hair growth are WNL. No peripheral edema or cyanosis. No masses, redness, swelling, asymmetry, or associated skin lesions. No contractures.  Skin & Extremity Inspection: Skin color, temperature, and hair growth are WNL. No peripheral edema or cyanosis. No masses, redness, swelling, asymmetry, or associated skin lesions. No contractures.  Functional ROM: Pain restricted ROM for hip and knee joints          Functional ROM: Pain restricted ROM for hip and knee joints          Muscle Tone/Strength: Functionally intact. No obvious neuro-muscular anomalies detected.  Muscle Tone/Strength: Functionally intact. No obvious neuro-muscular anomalies detected.  Sensory (Neurological): Dermatomal pain pattern  Sensory (Neurological): Dermatomal pain pattern        DTR: Patellar: deferred today Achilles: deferred today Plantar: deferred today  DTR: Patellar: deferred today Achilles: deferred today Plantar: deferred today  Palpation: No palpable anomalies  Palpation: No palpable anomalies     Assessment   Status Diagnosis  Controlled Controlled Controlled 1. S/P cervical spinal fusion (C3-C4 ACDF (03/2019)   2. Chronic pain syndrome   3. 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 DDD (degenerative disc disease), cervical   5. Cervicalgia   6. Cervical facet joint syndrome   7. 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 Chronic radicular cervical pain   9. Compression fracture of lumbar vertebra, non-traumatic, sequela   10. Spinal stenosis, lumbar region, with neurogenic claudication   11. 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)  Spinal Stenosis, Lumbar Region, With Neurogenic Claudication  Lumbar Radiculopathy  Lumbar Degenerative Disc Disease  Lumbar Facet Arthropathy  Chronic Pain Syndrome  Neurogenic Claudication    Plan of Care  Tammy Lamb has a current medication list which includes the following long-term medication(s): amlodipine, atorvastatin, calcium-vitamin d, carbidopa-levodopa, citalopram, gabapentin, hydrochlorothiazide, [START ON 04/13/2020] hydrocodone-acetaminophen, [START ON 05/13/2020] hydrocodone-acetaminophen, [START ON 06/12/2020] hydrocodone-acetaminophen, metformin, ramipril, and sulfasalazine.  Pharmacotherapy (Medications Ordered): Meds ordered this encounter  Medications  . HYDROcodone-acetaminophen (NORCO) 7.5-325 MG tablet    Sig: Take 1 tablet by mouth every 8 (eight) hours as needed for severe pain. Must last 30 days.    Dispense:  60 tablet    Refill:  0    [place] STOP ACT - Not applicable. Fill one day early if pharmacy is closed on scheduled refill date.  Marland Kitchen HYDROcodone-acetaminophen (NORCO) 7.5-325 MG tablet    Sig: Take 1 tablet by mouth every 8 (eight) hours as needed for severe pain. Must last 30 days.    Dispense:  60 tablet    Refill:  0    [place] STOP ACT - Not applicable. Fill one day early if pharmacy is closed on scheduled refill date.  Marland Kitchen HYDROcodone-acetaminophen (NORCO) 7.5-325 MG tablet    Sig: Take 1 tablet by mouth every 8 (eight) hours as needed for severe pain. Must last 30 days.    Dispense:  60 tablet    Refill:  0    [place] STOP ACT - Not applicable. Fill one day early if pharmacy is closed on scheduled refill date.  . gabapentin (NEURONTIN) 300 MG capsule    Sig: 300 mg day, 600 mg qhs     Dispense:  90 capsule    Refill:  2    Do not place this medication, or any other prescription from our practice, on "Automatic Refill". Patient may have prescription filled one day early if pharmacy is closed on scheduled refill date.  Marland Kitchen tiZANidine (ZANAFLEX) 4 MG tablet    Sig: Take 1 tablet (4 mg total) by mouth every 8 (eight) hours as needed.    Dispense:  90 tablet    Refill:  2   Follow-up plan:   Return in about 3 months (around 06/22/2020) for Medication Management, in person.   Recent Visits No visits were found meeting these conditions. Showing recent visits within past 90  days and meeting all other requirements Today's Visits Date Type Provider Dept  03/23/20 Office Visit Gillis Santa, MD [facility]  Showing today's visits and meeting all other requirements Future Appointments No visits were found meeting these conditions. Showing future appointments within next 90 days and meeting all other requirements  I discussed the assessment and treatment plan with the patient. The patient was provided an opportunity to ask questions and all were answered. The patient agreed with the plan and demonstrated an understanding of the instructions.  Patient advised to call back or seek an in-person evaluation if the symptoms or condition worsens.  Duration of encounter: 30 minutes.  Note by: Gillis Santa, MD Date: 03/23/2020; Time: 11:51 AM

## 2020-03-23 NOTE — Patient Instructions (Signed)
Prescriptions for Hydrocodone, Tizanidine, and Gabapentin have been sent to your pharmacy.

## 2020-03-23 NOTE — Progress Notes (Signed)
Nursing Pain Medication Assessment:  Safety precautions to be maintained throughout the outpatient stay will include: orient to surroundings, keep bed in low position, maintain call bell within reach at all times, provide assistance with transfer out of bed and ambulation.  Medication Inspection Compliance: Pill count conducted under aseptic conditions, in front of the patient. Neither the pills nor the bottle was removed from the patient's sight at any time. Once count was completed pills were immediately returned to the patient in their original bottle.  Medication: Hydrocodone/APAP Pill/Patch Count: 51 of 60 pills remain Pill/Patch Appearance: Markings consistent with prescribed medication Bottle Appearance: Standard pharmacy container. Clearly labeled. Filled Date:08 / 22 / 2021 Last Medication intake:  Yesterday

## 2020-04-19 ENCOUNTER — Other Ambulatory Visit: Payer: Self-pay | Admitting: Internal Medicine

## 2020-04-19 DIAGNOSIS — Z1231 Encounter for screening mammogram for malignant neoplasm of breast: Secondary | ICD-10-CM

## 2020-05-11 ENCOUNTER — Other Ambulatory Visit: Payer: Self-pay

## 2020-05-11 ENCOUNTER — Ambulatory Visit
Admission: RE | Admit: 2020-05-11 | Discharge: 2020-05-11 | Disposition: A | Discharge status: Home / Self Care | Payer: Medicare Other | Source: Ambulatory Visit | Attending: Internal Medicine | Admitting: Internal Medicine

## 2020-05-11 DIAGNOSIS — Z1231 Encounter for screening mammogram for malignant neoplasm of breast: Secondary | ICD-10-CM | POA: Diagnosis not present

## 2020-05-19 ENCOUNTER — Telehealth: Payer: Self-pay

## 2020-05-19 DIAGNOSIS — M47812 Spondylosis without myelopathy or radiculopathy, cervical region: Secondary | ICD-10-CM

## 2020-05-19 DIAGNOSIS — Z981 Arthrodesis status: Secondary | ICD-10-CM

## 2020-05-19 DIAGNOSIS — M4692 Unspecified inflammatory spondylopathy, cervical region: Secondary | ICD-10-CM

## 2020-05-19 DIAGNOSIS — G894 Chronic pain syndrome: Secondary | ICD-10-CM

## 2020-05-19 NOTE — Telephone Encounter (Signed)
The patient saw Dr. Cari Caraway Oct.14 and he suggested cervical injection ,what is the status do you want to order

## 2020-05-19 NOTE — Telephone Encounter (Signed)
Order placed for right C3, C4 cervical facet medial branch nerve block, diagnostic.  Please call patient to schedule.  Orders Placed This Encounter  Procedures  . CERVICAL FACET (MEDIAL BRANCH NERVE BLOCK)     Standing Status:   Future    Standing Expiration Date:   06/19/2020    Scheduling Instructions:     Side: RIGHT     Level: C3-4, Facet joints (C3, C4, Medial Branch Nerves)     Sedation: Patient's choice.     Timeframe: As soon as schedule allows    Order Specific Question:   Where will this procedure be performed?    Answer:   [facility]

## 2020-06-07 ENCOUNTER — Ambulatory Visit (HOSPITAL_BASED_OUTPATIENT_CLINIC_OR_DEPARTMENT_OTHER): Payer: Medicare Other | Admitting: Student in an Organized Health Care Education/Training Program

## 2020-06-07 ENCOUNTER — Other Ambulatory Visit: Payer: Self-pay

## 2020-06-07 ENCOUNTER — Ambulatory Visit
Admission: RE | Admit: 2020-06-07 | Discharge: 2020-06-07 | Disposition: A | Discharge status: Home / Self Care | Payer: Medicare Other | Source: Ambulatory Visit | Attending: Student in an Organized Health Care Education/Training Program | Admitting: Student in an Organized Health Care Education/Training Program

## 2020-06-07 ENCOUNTER — Encounter: Payer: Self-pay | Admitting: Student in an Organized Health Care Education/Training Program

## 2020-06-07 DIAGNOSIS — G894 Chronic pain syndrome: Secondary | ICD-10-CM

## 2020-06-07 DIAGNOSIS — M4692 Unspecified inflammatory spondylopathy, cervical region: Secondary | ICD-10-CM

## 2020-06-07 DIAGNOSIS — M47812 Spondylosis without myelopathy or radiculopathy, cervical region: Secondary | ICD-10-CM | POA: Insufficient documentation

## 2020-06-07 MED ORDER — DEXAMETHASONE SODIUM PHOSPHATE 10 MG/ML IJ SOLN
INTRAMUSCULAR | Status: AC
  Filled 2020-06-07: qty 1

## 2020-06-07 MED ORDER — MIDAZOLAM HCL 5 MG/5ML IJ SOLN
1.0000 mg | INTRAMUSCULAR | Status: DC | PRN
  Administered 2020-06-07: 1 mg via INTRAVENOUS

## 2020-06-07 MED ORDER — ROPIVACAINE HCL 2 MG/ML IJ SOLN
9.0000 mL | Freq: Once | INTRAMUSCULAR | Status: AC
  Administered 2020-06-07: 9 mL via PERINEURAL

## 2020-06-07 MED ORDER — LIDOCAINE HCL 2 % IJ SOLN
20.0000 mL | Freq: Once | INTRAMUSCULAR | Status: AC
  Administered 2020-06-07: 400 mg

## 2020-06-07 MED ORDER — MIDAZOLAM HCL 5 MG/5ML IJ SOLN
INTRAMUSCULAR | Status: AC
  Filled 2020-06-07: qty 5

## 2020-06-07 MED ORDER — DEXAMETHASONE SODIUM PHOSPHATE 10 MG/ML IJ SOLN
10.0000 mg | Freq: Once | INTRAMUSCULAR | Status: AC
  Administered 2020-06-07: 10 mg

## 2020-06-07 MED ORDER — ROPIVACAINE HCL 2 MG/ML IJ SOLN
INTRAMUSCULAR | Status: AC
  Filled 2020-06-07: qty 10

## 2020-06-07 MED ORDER — FENTANYL CITRATE (PF) 100 MCG/2ML IJ SOLN
INTRAMUSCULAR | Status: AC
  Filled 2020-06-07: qty 2

## 2020-06-07 MED ORDER — LIDOCAINE HCL 2 % IJ SOLN
INTRAMUSCULAR | Status: AC
  Filled 2020-06-07: qty 20

## 2020-06-07 MED ORDER — FENTANYL CITRATE (PF) 100 MCG/2ML IJ SOLN
25.0000 ug | INTRAMUSCULAR | Status: DC | PRN
  Administered 2020-06-07: 100 ug via INTRAVENOUS

## 2020-06-07 NOTE — Progress Notes (Signed)
Safety precautions to be maintained throughout the outpatient stay will include: orient to surroundings, keep bed in low position, maintain call bell within reach at all times, provide assistance with transfer out of bed and ambulation.  

## 2020-06-07 NOTE — Patient Instructions (Signed)

## 2020-06-07 NOTE — Progress Notes (Signed)
PROVIDER NOTE: Information contained herein reflects review and annotations entered in association with encounter. Interpretation of such information and data should be left to medically-trained personnel. Information provided to patient can be located elsewhere in the medical record under "Patient Instructions". Document created using STT-dictation technology, any transcriptional errors that may result from process are unintentional.    Patient: Tammy Lamb  Service Category: Procedure  Provider: Gillis Santa, MD  DOB: [birth year]  DOS: 06/07/2020  Location: [facility]  MRN: [id number]  Setting: Ambulatory - outpatient  Referring Provider: Gillis Santa, MD  Type: Established Patient  Specialty: Interventional Pain Management  PCP: Gladstone Lighter, MD   Primary Reason for Visit: Interventional Pain Management Treatment. CC: Procedure  Procedure:          Anesthesia, Analgesia, Anxiolysis:  Type: Cervical Facet Medial Branch Block(s)  #1  Primary Purpose: Diagnostic Region: Posterolateral cervical spine Level:C4, C5, C6, Medial Branch Level(s). Injecting these levels blocks the C4-5, C5-6, cervical facet joints. Laterality: Bilateral  Type: Moderate (Conscious) Sedation combined with Local Anesthesia Indication(s): Analgesia and Anxiety Route: Intravenous (IV) IV Access: Secured Sedation: Meaningful verbal contact was maintained at all times during the procedure  Local Anesthetic: Lidocaine 1-2%  Position: Prone with head of the table raised to facilitate breathing.   Indications: 1. 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 Chronic pain syndrome    Pain Score: Pre-procedure: 8 /10 Post-procedure: 1 /10   Pre-op Assessment:  Tammy Lamb is a 69 y.o. (year old), female patient, seen today for interventional treatment. She  has a past surgical history that includes Tonsillectomy; Cholecystectomy; Abdominal  hysterectomy; Diagnostic laparoscopy; Bladder suspension; Colonoscopy; Upper gi endoscopy; Knee arthroscopy (Left, 02/13/2014); Ankle arthroscopy (Left, 03/22/2016); Tendon repair (Left, 03/22/2016); Lumbar laminectomy/decompression microdiscectomy (N/A, 05/30/2017); and Anterior cervical decomp/discectomy fusion (N/A, 04/09/2019). Tammy Lamb has a current medication list which includes the following prescription(s): acetaminophen, amitriptyline, amlodipine, atorvastatin, calcium-vitamin d, carbidopa-levodopa, cephalexin, vitamin d3, cinnamon, citalopram, gabapentin, hydrochlorothiazide, hydrocodone-acetaminophen, [START ON 06/12/2020] hydrocodone-acetaminophen, hydroxyzine, metformin, elmiron, ramipril, sulfasalazine, tizanidine, ventolin hfa, and hydrocodone-acetaminophen, and the following Facility-Administered Medications: fentanyl and midazolam. Her primarily concern today is the Procedure  Initial Vital Signs:  Pulse/HCG Rate: 71ECG Heart Rate: 77 Temp: 99 F (37.2 C) Resp: 18 BP: 128/90 SpO2: 96 %  BMI: Estimated body mass index is 31.31 kg/m as calculated from the following:   Height as of this encounter: 5' 6"  (1.676 m).   Weight as of this encounter: 194 lb (88 kg).  Risk Assessment: Allergies: Reviewed. She is allergic to sulfa antibiotics.  Allergy Precautions: None required Coagulopathies: Reviewed. None identified.  Blood-thinner therapy: None at this time Active Infection(s): Reviewed. None identified. Tammy Lamb is afebrile  Site Confirmation: Tammy Lamb was asked to confirm the procedure and laterality before marking the site Procedure checklist: Completed Consent: Before the procedure and under the influence of no sedative(s), amnesic(s), or anxiolytics, the patient was informed of the treatment options, risks and possible complications. To fulfill our ethical and legal obligations, as recommended by the American Medical Association's Code of Ethics, I have informed the patient of  my clinical impression; the nature and purpose of the treatment or procedure; the risks, benefits, and possible complications of the intervention; the alternatives, including doing nothing; the risk(s) and benefit(s) of the alternative treatment(s) or procedure(s); and the risk(s) and benefit(s) of doing nothing. The patient was provided information about the general risks and possible complications associated with  the procedure. These may include, but are not limited to: failure to achieve desired goals, infection, bleeding, organ or nerve damage, allergic reactions, paralysis, and death. In addition, the patient was informed of those risks and complications associated to Spine-related procedures, such as failure to decrease pain; infection (i.e.: Meningitis, epidural or intraspinal abscess); bleeding (i.e.: epidural hematoma, subarachnoid hemorrhage, or any other type of intraspinal or peri-dural bleeding); organ or nerve damage (i.e.: Any type of peripheral nerve, nerve root, or spinal cord injury) with subsequent damage to sensory, motor, and/or autonomic systems, resulting in permanent pain, numbness, and/or weakness of one or several areas of the body; allergic reactions; (i.e.: anaphylactic reaction); and/or death. Furthermore, the patient was informed of those risks and complications associated with the medications. These include, but are not limited to: allergic reactions (i.e.: anaphylactic or anaphylactoid reaction(s)); adrenal axis suppression; blood sugar elevation that in diabetics may result in ketoacidosis or comma; water retention that in patients with history of congestive heart failure may result in shortness of breath, pulmonary edema, and decompensation with resultant heart failure; weight gain; swelling or edema; medication-induced neural toxicity; particulate matter embolism and blood vessel occlusion with resultant organ, and/or nervous system infarction; and/or aseptic necrosis of one or  more joints. Finally, the patient was informed that Medicine is not an exact science; therefore, there is also the possibility of unforeseen or unpredictable risks and/or possible complications that may result in a catastrophic outcome. The patient indicated having understood very clearly. We have given the patient no guarantees and we have made no promises. Enough time was given to the patient to ask questions, all of which were answered to the patient's satisfaction. Tammy Lamb has indicated that she wanted to continue with the procedure. Attestation: I, the ordering provider, attest that I have discussed with the patient the benefits, risks, side-effects, alternatives, likelihood of achieving goals, and potential problems during recovery for the procedure that I have provided informed consent. Date  Time: 06/07/2020  9:06 AM  Pre-Procedure Preparation:  Monitoring: As per clinic protocol. Respiration, ETCO2, SpO2, BP, heart rate and rhythm monitor placed and checked for adequate function Safety Precautions: Patient was assessed for positional comfort and pressure points before starting the procedure. Time-out: I initiated and conducted the "Time-out" before starting the procedure, as per protocol. The patient was asked to participate by confirming the accuracy of the "Time Out" information. Verification of the correct person, site, and procedure were performed and confirmed by me, the nursing staff, and the patient. "Time-out" conducted as per Joint Commission's Universal Protocol (UP.01.01.01). Time: 1005  Description of Procedure:          Laterality: Bilateral. The procedure was performed in identical fashion on both sides. Level: C4, C5, C6, Medial Branch Level(s). Area Prepped: Posterior Cervico-thoracic Region DuraPrep (Iodine Povacrylex [0.7% available iodine] and Isopropyl Alcohol, 74% w/w) Safety Precautions: Aspiration looking for blood return was conducted prior to all injections. At no  point did we inject any substances, as a needle was being advanced. Before injecting, the patient was told to immediately notify me if she was experiencing any new onset of "ringing in the ears, or metallic taste in the mouth". No attempts were made at seeking any paresthesias. Safe injection practices and needle disposal techniques used. Medications properly checked for expiration dates. SDV (single dose vial) medications used. After the completion of the procedure, all disposable equipment used was discarded in the proper designated medical waste containers. Local Anesthesia: Protocol guidelines were followed. The patient  was positioned over the fluoroscopy table. The area was prepped in the usual manner. The time-out was completed. The target area was identified using fluoroscopy. A 12-in long, straight, sterile hemostat was used with fluoroscopic guidance to locate the targets for each level blocked. Once located, the skin was marked with an approved surgical skin marker. Once all sites were marked, the skin (epidermis, dermis, and hypodermis), as well as deeper tissues (fat, connective tissue and muscle) were infiltrated with a small amount of a short-acting local anesthetic, loaded on a 10cc syringe with a 25G, 1.5-in  Needle. An appropriate amount of time was allowed for local anesthetics to take effect before proceeding to the next step. Local Anesthetic: Lidocaine 2.0% The unused portion of the local anesthetic was discarded in the proper designated containers. Technical explanation of process:  C4 Medial Branch Nerve Block (MBB): The target area for the C4 dorsal medial articular branch is the lateral concave waist of the articular pillar of C4. Under fluoroscopic guidance, a Quincke needle was inserted until contact was made with os over the postero-lateral aspect of the articular pillar of C4 (target area). After negative aspiration for blood, 661m of the nerve block solution was injected without  difficulty or complication. The needle was removed intact. C5 Medial Branch Nerve Block (MBB): The target area for the C5 dorsal medial articular branch is the lateral concave waist of the articular pillar of C5. Under fluoroscopic guidance, a Quincke needle was inserted until contact was made with os over the postero-lateral aspect of the articular pillar of C5 (target area). After negative aspiration for blood, 1361mof the nerve block solution was injected without difficulty or complication. The needle was removed intact. C6 Medial Branch Nerve Block (MBB): The target area for the C6 dorsal medial articular branch is the lateral concave waist of the articular pillar of C6. Under fluoroscopic guidance, a Quincke needle was inserted until contact was made with os over the postero-lateral aspect of the articular pillar of C6 (target area). After negative aspiration for blood, 61m77mf the nerve block solution was injected without difficulty or complication. The needle was removed intact.  Procedural Needles: 6 cc solution made of 5 cc of 0.2% ropivacaine, 1 cc of Decadron 10 mg/cc.  1 cc injected at each level above bilaterally. 25-gauge, 3.5-inch, Quincke needles used for all levels. Nerve block solution:  The unused portion of the solution was discarded in the proper designated containers.  Once the entire procedure was completed, the treated area was cleaned, making sure to leave some of the prepping solution back to take advantage of its long term bactericidal properties.  Anatomy Reference Guide:       Vitals:   06/07/20 1018 06/07/20 1028 06/07/20 1038 06/07/20 1048  BP: (!) 122/94 119/79 126/86 115/78  Pulse:      Resp: 18 16 12 14   Temp:  (!) 97.1 F (36.2 C)    TempSrc:      SpO2: 96% 97% 96% 98%  Weight:      Height:        Start Time: 1005 hrs. End Time: 1018 hrs.  Imaging Guidance (Spinal):          Type of Imaging Technique: Fluoroscopy Guidance (Spinal) Indication(s):  Assistance in needle guidance and placement for procedures requiring needle placement in or near specific anatomical locations not easily accessible without such assistance. Exposure Time: Please see nurses notes. Contrast: None used. Fluoroscopic Guidance: I was personally present during the use of  fluoroscopy. "Tunnel Vision Technique" used to obtain the best possible view of the target area. Parallax error corrected before commencing the procedure. "Direction-depth-direction" technique used to introduce the needle under continuous pulsed fluoroscopy. Once target was reached, antero-posterior, oblique, and lateral fluoroscopic projection used confirm needle placement in all planes. Images permanently stored in EMR. Interpretation: No contrast injected. I personally interpreted the imaging intraoperatively. Adequate needle placement confirmed in multiple planes. Permanent images saved into the patient's record.  Antibiotic Prophylaxis:   Anti-infectives (From admission, onward)   None     Indication(s): None identified  Post-operative Assessment:  Post-procedure Vital Signs:  Pulse/HCG Rate: 7169 Temp: (!) 97.1 F (36.2 C) Resp: 14 BP: 115/78 SpO2: 98 %  EBL: None  Complications: No immediate post-treatment complications observed by team, or reported by patient.  Note: The patient tolerated the entire procedure well. A repeat set of vitals were taken after the procedure and the patient was kept under observation following institutional policy, for this type of procedure. Post-procedural neurological assessment was performed, showing return to baseline, prior to discharge. The patient was provided with post-procedure discharge instructions, including a section on how to identify potential problems. Should any problems arise concerning this procedure, the patient was given instructions to immediately contact us, at any time, without hesitation. In any case, we plan to contact the patient by  telephone for a follow-up status report regarding this interventional procedure.  Comments:  No additional relevant information.  Plan of Care  Orders:  Orders Placed This Encounter  Procedures  . [facility] C-ARM 1-60 MIN NO REPORT    Intraoperative interpretation by procedural physician at [place].    Standing Status:   Standing    Number of Occurrences:   1    Order Specific Question:   Reason for exam:    Answer:   Assistance in needle guidance and placement for procedures requiring needle placement in or near specific anatomical locations not easily accessible without such assistance.   Chronic Opioid Analgesic:   06/02/2019  1   04/29/2019  Hydrocodone-Acetamin 7.5-325  60.00  30 Bi Lat   [id number]   [place] ([id number])   0  15.00 MME  Private Pay   [facility]        Medications ordered for procedure: Meds ordered this encounter  Medications  . lidocaine (XYLOCAINE) 2 % (with pres) injection 400 mg  . midazolam (VERSED) 5 MG/5ML injection 1-2 mg    Make sure Flumazenil is available in the pyxis when using this medication. If oversedation occurs, administer 0.2 mg IV over 15 sec. If after 45 sec no response, administer 0.2 mg again over 1 min; may repeat at 1 min intervals; not to exceed 4 doses (1 mg)  . fentaNYL (SUBLIMAZE) injection 25-50 mcg    Make sure Narcan is available in the pyxis when using this medication. In the event of respiratory depression (RR< 8/min): Titrate NARCAN (naloxone) in increments of 0.1 to 0.2 mg IV at 2-3 minute intervals, until desired degree of reversal.  . ropivacaine (PF) 2 mg/mL (0.2%) (NAROPIN) injection 9 mL  . dexamethasone (DECADRON) injection 10 mg  . ropivacaine (PF) 2 mg/mL (0.2%) (NAROPIN) injection 9 mL   Medications administered: We administered lidocaine, midazolam, fentaNYL, ropivacaine (PF) 2 mg/mL (0.2%), dexamethasone, and ropivacaine (PF) 2 mg/mL (0.2%).  See the medical record for exact dosing, route, and time of  administration.  Follow-up plan:   Return for Keep sch. appt.    Recent Visits  Date Type Provider Dept  03/23/20 Office Visit Gillis Santa, MD [facility]  Showing recent visits within past 90 days and meeting all other requirements Today's Visits Date Type Provider Dept  06/07/20 Procedure visit Gillis Santa, MD [facility]  Showing today's visits and meeting all other requirements Future Appointments Date Type Provider Dept  06/22/20 Appointment Gillis Santa, MD [facility]  Showing future appointments within next 90 days and meeting all other requirements  Disposition: Discharge home  Discharge (Date  Time): 06/07/2020; 1050 hrs.   Primary Care Physician: Gladstone Lighter, MD Location: [facility] Note by: Gillis Santa, MD Date: 06/07/2020; Time: 10:57 AM  Disclaimer:  Medicine is not an exact science. The only guarantee in medicine is that nothing is guaranteed. It is important to note that the decision to proceed with this intervention was based on the information collected from the patient. The Data and conclusions were drawn from the patient's questionnaire, the interview, and the physical examination. Because the information was provided in large part by the patient, it cannot be guaranteed that it has not been purposely or unconsciously manipulated. Every effort has been made to obtain as much relevant data as possible for this evaluation. It is important to note that the conclusions that lead to this procedure are derived in large part from the available data. Always take into account that the treatment will also be dependent on availability of resources and existing treatment guidelines, considered by other Pain Management Practitioners as being common knowledge and practice, at the time of the intervention. For Medico-Legal purposes, it is also important to point out that variation in procedural techniques and  pharmacological choices are the acceptable norm. The indications, contraindications, technique, and results of the above procedure should only be interpreted and judged by a Board-Certified Interventional Pain Specialist with extensive familiarity and expertise in the same exact procedure and technique.

## 2020-06-08 ENCOUNTER — Telehealth: Payer: Self-pay | Admitting: *Deleted

## 2020-06-08 NOTE — Telephone Encounter (Signed)
Called for post procedure check. States neck is hurting this AM and she has been up since 0300. Encouraged heat 15/15. Also instructed it may take the steroid a few days to work and to make sure she keeps up with her pain diary. No post procedure issues/concerns noted.

## 2020-06-09 ENCOUNTER — Telehealth: Payer: Self-pay | Admitting: Student in an Organized Health Care Education/Training Program

## 2020-06-09 NOTE — Telephone Encounter (Signed)
Has been applying heat but has not gotten any relief from procedure since numbing wore off. Please call patient

## 2020-06-10 ENCOUNTER — Telehealth: Payer: Self-pay

## 2020-06-10 NOTE — Telephone Encounter (Signed)
I recommend that she alternate heat and ice to her neck region.  Also recommend over-the-counter lidocaine patch/gel to her neck region.  Continue to monitor.

## 2020-06-10 NOTE — Telephone Encounter (Signed)
Called patient and informed

## 2020-06-10 NOTE — Telephone Encounter (Signed)
She wants a nurse to call her, she is still having a lot of pain from her procedure Monday

## 2020-06-10 NOTE — Telephone Encounter (Signed)
Called patient. States pain is worse than before procedure she had on Monday. She is taking her hydrocodone and tizanidine as directed with no relief. Please advise on what to tell her to do. She denies any fever, weakness or any other concerning symptoms. Thank you- Anderson Malta

## 2020-06-13 ENCOUNTER — Other Ambulatory Visit: Payer: Self-pay | Admitting: Student in an Organized Health Care Education/Training Program

## 2020-06-13 DIAGNOSIS — G894 Chronic pain syndrome: Secondary | ICD-10-CM

## 2020-06-22 ENCOUNTER — Encounter: Payer: Self-pay | Admitting: Student in an Organized Health Care Education/Training Program

## 2020-06-22 ENCOUNTER — Other Ambulatory Visit: Payer: Self-pay

## 2020-06-22 ENCOUNTER — Ambulatory Visit
Payer: Medicare Other | Attending: Student in an Organized Health Care Education/Training Program | Admitting: Student in an Organized Health Care Education/Training Program

## 2020-06-22 VITALS — BP 129/90 | HR 72 | Temp 97.0°F | Resp 18 | Ht 66.0 in | Wt 191.0 lb

## 2020-06-22 DIAGNOSIS — M5412 Radiculopathy, cervical region: Secondary | ICD-10-CM | POA: Diagnosis present

## 2020-06-22 DIAGNOSIS — G894 Chronic pain syndrome: Secondary | ICD-10-CM

## 2020-06-22 DIAGNOSIS — Z981 Arthrodesis status: Secondary | ICD-10-CM | POA: Insufficient documentation

## 2020-06-22 DIAGNOSIS — M48062 Spinal stenosis, lumbar region with neurogenic claudication: Secondary | ICD-10-CM | POA: Diagnosis present

## 2020-06-22 DIAGNOSIS — M5030 Other cervical disc degeneration, unspecified cervical region: Secondary | ICD-10-CM

## 2020-06-22 DIAGNOSIS — M47812 Spondylosis without myelopathy or radiculopathy, cervical region: Secondary | ICD-10-CM

## 2020-06-22 DIAGNOSIS — G8929 Other chronic pain: Secondary | ICD-10-CM | POA: Diagnosis present

## 2020-06-22 DIAGNOSIS — M961 Postlaminectomy syndrome, not elsewhere classified: Secondary | ICD-10-CM | POA: Diagnosis present

## 2020-06-22 DIAGNOSIS — M4692 Unspecified inflammatory spondylopathy, cervical region: Secondary | ICD-10-CM | POA: Diagnosis not present

## 2020-06-22 DIAGNOSIS — M5416 Radiculopathy, lumbar region: Secondary | ICD-10-CM | POA: Diagnosis present

## 2020-06-22 MED ORDER — HYDROCODONE-ACETAMINOPHEN 7.5-325 MG PO TABS: 1.0000 | ORAL_TABLET | Freq: Three times a day (TID) | ORAL | 0 refills | Status: DC | PRN

## 2020-06-22 MED ORDER — GABAPENTIN 300 MG PO CAPS: ORAL_CAPSULE | ORAL | 2 refills | Status: DC

## 2020-06-22 MED ORDER — METHYLPREDNISOLONE 4 MG PO TBPK: ORAL_TABLET | ORAL | 0 refills | Status: AC

## 2020-06-22 NOTE — Progress Notes (Signed)
PROVIDER NOTE: Information contained herein reflects review and annotations entered in association with encounter. Interpretation of such information and data should be left to medically-trained personnel. Information provided to patient can be located elsewhere in the medical record under "Patient Instructions". Document created using STT-dictation technology, any transcriptional errors that may result from process are unintentional.    Patient: Tammy Lamb  Service Category: E/M  Provider: Gillis Santa, MD  DOB: 08/28/1950  DOS: 06/22/2020  Specialty: Interventional Pain Management  MRN: [id number]  Setting: Ambulatory outpatient  PCP: Gladstone Lighter, MD  Type: Established Patient    Referring Provider: Guadlupe Spanish, MD  Location: Office  Delivery: Face-to-face     HPI  Tammy Lamb, a 69 y.o. year old female, is here today because of her Cervical facet joint syndrome [M47.812]. Tammy Lamb primary complain today is Neck Pain, Back Pain (lower), and Hip Pain (right) Last encounter: My last encounter with her was on 06/13/2020. 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 Pain Assessment: Severity of Chronic pain is reported as a 8 /10. Location: Back Lower/right leg to the calf. Onset: More than a month ago. Quality: Pressure, Shooting. Timing: Constant. Modifying factor(s): nothing. Vitals:  height is 5' 6" (1.676 m) and weight is 191 lb (86.6 kg). Her temporal temperature is 97 F  (36.1 C) (abnormal). Her blood pressure is 129/90 and her pulse is 72. Her respiration is 18 and oxygen saturation is 100%.   Reason for encounter: both, medication management and post-procedure assessment.    Post-Procedure Evaluation  Procedure (06/07/2020):  Type: Cervical Facet Medial Branch Block(s)  #1  Primary Purpose: Diagnostic Region: Posterolateral cervical spine Level:C4, C5, C6, Medial Branch Level(s). Injecting these levels blocks the C4-5, C5-6, cervical facet joints. Laterality: Bilateral  Sedation: Please see nurses note.  Effectiveness during initial hour after procedure(Ultra-Short Term Relief): 0 %  Local anesthetic used: Long-acting (4-6 hours) Effectiveness: Defined as any analgesic benefit obtained secondary to the administration of local anesthetics. This carries significant diagnostic value as to the etiological location, or anatomical origin, of the pain. Duration of benefit is expected to coincide with the duration of the local anesthetic used.  Effectiveness during initial 4-6 hours after procedure(Short-Term Relief): 0 %   Long-term benefit: Defined as any relief past the pharmacologic duration of the local anesthetics.  Effectiveness past the initial 6 hours after procedure(Long-Term Relief): 0%  Current benefits: Defined as benefit that persist at this time.   Analgesia:  No benefit Function: No benefit ROM: No benefit   Pharmacotherapy Assessment   Analgesic:  Hydrocodone 7.5 mg BID prn 60/month     Monitoring: [place] PMP: PDMP reviewed during this encounter.       Pharmacotherapy: No side-effects or adverse reactions reported. Compliance: No problems identified. Effectiveness: Clinically acceptable.  Landis Martins, RN  06/22/2020  9:32 AM  Sign when Signing Visit Nursing Pain Medication Assessment:  Safety precautions to be maintained throughout the outpatient stay will include: orient to surroundings, keep bed in low position, maintain call  bell within reach at all times, provide assistance with transfer out of bed and ambulation.  Medication  Inspection Compliance: Pill count conducted under aseptic conditions, in front of the patient. Neither the pills nor the bottle was removed from the patient's sight at any time. Once count was completed pills were immediately returned to the patient in their original bottle.  Medication: Hydrocodone/APAP Pill/Patch Count: 52 of 60 pills remain Pill/Patch Appearance: Markings consistent with prescribed medication Bottle Appearance: Standard pharmacy container. Clearly labeled. Filled Date: 06-17-2020 Last Medication intake:  Yesterday    UDS:  Summary  Date Value Ref Range Status  09/23/2019 Note  Final    Comment:    ==================================================================== [employer] [facility] (MW) ==================================================================== Test                             Result       Flag       Units Drug Present and Declared for Prescription Verification   Hydrocodone                    428          EXPECTED   ng/mg creat   Norhydrocodone                 1180         EXPECTED   ng/mg creat    Sources of hydrocodone include scheduled prescription medications.    Norhydrocodone is an expected metabolite of hydrocodone. ==================================================================== Test                      Result    Flag   Units      Ref Range   Creatinine              25               mg/dL      >=20 ==================================================================== Declared Medications:  The flagging and interpretation on this report are based on the  following declared medications.  Unexpected results may arise from  inaccuracies in the declared medications.  **Note: The testing scope of this panel includes these medications:  Hydrocodone  **Note: The testing scope of this panel does not include the  following reported  medications:  Acetaminophen  Albuterol  Amitriptyline  Amlodipine  Cholecalciferol  Cinnamon  Citalopram  Gabapentin  Hydrochlorothiazide  Hydroxyzine  Melatonin  Nitrofurantoin (Macrobid)  Omeprazole  Oxybutynin  Phenazopyridine (Pyridium)  Phenylephrine  Prednisone  Ramipril  Sodium Bicarbonate  Sulfasalazine  Tizanidine  Tolterodine  Trimethoprim ==================================================================== For clinical consultation, please call [phone]. ====================================================================      ROS  Constitutional: Denies any fever or chills Gastrointestinal: No reported hemesis, hematochezia, vomiting, or acute GI distress Musculoskeletal: Denies any acute onset joint swelling, redness, loss of ROM, or weakness Neurological: No reported episodes of acute onset apraxia, aphasia, dysarthria, agnosia, amnesia, paralysis, loss of coordination, or loss of consciousness  Medication Review  Cinnamon, HYDROcodone-acetaminophen, Vitamin D3, acetaminophen, albuterol, amLODipine, atorvastatin, cephALEXin, citalopram, gabapentin, hydrOXYzine, hydrochlorothiazide, metFORMIN, methylPREDNISolone, pentosan polysulfate, ramipril, sulfaSALAzine, and tiZANidine  History Review  Allergy: Tammy Lamb is allergic to sulfa antibiotics. Drug: Tammy Lamb  reports no history of drug use. Alcohol:  reports current alcohol use. Tobacco:  reports that she has been smoking. She has a 24.50 pack-year smoking history. She has never used smokeless tobacco. Social: Tammy Lamb  reports that she has been smoking. She has a 24.50 pack-year smoking history. She has never used smokeless tobacco. She reports current alcohol use. She  reports that she does not use drugs. Medical:  has a past medical history of Anxiety, Arthritis, Back pain, Colitis, ulcerative ([place]), COPD (chronic obstructive pulmonary disease) ([place]), Depression, Diabetes mellitus without  complication ([place]), GERD (gastroesophageal reflux disease), H/O bladder infections, Headache, Hypertension, Motion sickness, Parkinson's disease ([place]), Stroke ([place]) (2015), TIA (transient ischemic attack) (2015), Urinary frequency, and Wears dentures. Surgical: Tammy Lamb  has a past surgical history that includes Tonsillectomy; Cholecystectomy; Abdominal hysterectomy; Diagnostic laparoscopy; Bladder suspension; Colonoscopy; Upper gi endoscopy; Knee arthroscopy (Left, 02/13/2014); Ankle arthroscopy (Left, 03/22/2016); Tendon repair (Left, 03/22/2016); Lumbar laminectomy/decompression microdiscectomy (N/A, 05/30/2017); and Anterior cervical decomp/discectomy fusion (N/A, 04/09/2019). Family: family history includes COPD in her father; Diabetes in her mother; Heart disease in her father; Hypertension in her father and mother.  Laboratory Chemistry Profile   Renal Lab Results  Component Value Date   BUN 10 04/02/2019   CREATININE 0.80 10/10/2019   GFRAA >60 04/02/2019   GFRNONAA >60 04/02/2019     Hepatic No results found for: AST, ALT, ALBUMIN, ALKPHOS, HCVAB, AMYLASE, LIPASE, AMMONIA   Electrolytes Lab Results  Component Value Date   NA 134 (L) 04/02/2019   K 3.9 04/02/2019   CL 93 (L) 04/02/2019   CALCIUM 9.2 04/02/2019     Bone No results found for: VD25OH, VD125OH2TOT, FI4332RJ1, OA4166AY3, 25OHVITD1, 25OHVITD2, 25OHVITD3, TESTOFREE, TESTOSTERONE   Inflammation (CRP: Acute Phase) (ESR: Chronic Phase) No results found for: CRP, ESRSEDRATE, LATICACIDVEN     Note: Above Lab results reviewed.   Physical Exam  General appearance: Well nourished, well developed, and well hydrated. In no apparent acute distress Mental status: Alert, oriented x 3 (person, place, & time)       Respiratory: No evidence of acute respiratory distress Eyes: PERLA Vitals: BP 129/90   Pulse 72   Temp (!) 97 F (36.1 C) (Temporal)   Resp 18   Ht 5' 6" (1.676 m)   Wt 191 lb (86.6 kg)   SpO2 100%   BMI 30.83  kg/m  BMI: Estimated body mass index is 30.83 kg/m as calculated from the following:   Height as of this encounter: 5' 6" (1.676 m).   Weight as of this encounter: 191 lb (86.6 kg). Ideal: Ideal body weight: 59.3 kg (130 lb 11.7 oz) Adjusted ideal body weight: 70.2 kg (154 lb 13.4 oz)  Cervical Spine Exam  Skin & Axial Inspection:Well healed scar from previous spine surgery detected Alignment:Symmetrical Functional KZS:WFUX restricted ROM Stability:No instability detected Muscle Tone/Strength:Functionally intact. No obvious neuro-muscular anomalies detected. Sensory (Neurological):Dermatomal pain pattern Palpation:Complains of area being tender to palpation        Upper Extremity (UE) Exam    Side:Right upper extremity  Side:Left upper extremity   Skin & Extremity Inspection:Skin color, temperature, and hair growth are WNL. No peripheral edema or cyanosis. No masses, redness, swelling, asymmetry, or associated skin lesions. No contractures.  Skin & Extremity Inspection:Skin color, temperature, and hair growth are WNL. No peripheral edema or cyanosis. No masses, redness, swelling, asymmetry, or associated skin lesions. No contractures.   Functional NAT:FTDDUKGURKYH ROM  Functional CWC:BJSEGBTDVVOH ROM   Muscle Tone/Strength:Functionally intact. No obvious neuro-muscular anomalies detected.  Muscle Tone/Strength:Functionally intact. No obvious neuro-muscular anomalies detected.   Sensory (Neurological):Dermatomal pain pattern  Sensory (Neurological):Dermatomal pain pattern   Palpation:No palpable anomalies  Palpation:No palpable anomalies   Provocative Test(s): Phalen's test:deferred Tinel's test:deferred Apley's scratch test (touch opposite shoulder): Action 1 (Across chest):Decreased ROM Action 2 (Overhead):Decreased ROM Action 3 (LB reach):deferred   Provocative  Test(s): Phalen's test:deferred Tinel's test:deferred Apley's scratch test (touch opposite shoulder): Action 1 (Across chest):Decreased ROM Action 2 (Overhead):Decreased ROM Action 3 (LB reach):deferred     Thoracic Spine Area Exam  Skin & Axial Inspection:No masses, redness, or swelling Alignment:Symmetrical Functional IWP:YKDXIPJASNKN ROM Stability:No instability detected Muscle Tone/Strength:Functionally intact. No obvious neuro-muscular anomalies detected. Sensory (Neurological):Unimpaired Muscle strength & Tone:No palpable anomalies  Lumbar Exam  Skin & Axial Inspection:Well healed scar from previous spine surgery detected Alignment:Symmetrical Functional LZJ:QBHA restricted ROM Stability:No instability detected Muscle Tone/Strength:Functionally intact. No obvious neuro-muscular anomalies detected. Sensory (Neurological):Dermatomal pain pattern  Gait & Posture Assessment  Ambulation:Limited Gait:Age-related, senile gait pattern Posture:WNL  Lower Extremity Exam    Side:Right lower extremity  Side:Left lower extremity  Stability:No instability observed  Stability:No instability observed  Skin & Extremity Inspection:Skin color, temperature, and hair growth are WNL. No peripheral edema or cyanosis. No masses, redness, swelling, asymmetry, or associated skin lesions. No contractures.  Skin & Extremity Inspection:Skin color, temperature, and hair growth are WNL. No peripheral edema or cyanosis. No masses, redness, swelling, asymmetry, or associated skin lesions. No contractures.  Functional LPF:XTKW restricted ROMfor hip and knee joints   Functional IOX:BDZH restricted ROMfor hip and knee joints   Muscle Tone/Strength:Functionally intact. No obvious neuro-muscular anomalies detected.  Muscle Tone/Strength:Functionally intact. No obvious neuro-muscular anomalies detected.  Sensory  (Neurological):Dermatomal pain pattern  Sensory (Neurological):Dermatomal pain pattern  DTR: Patellar:deferred today Achilles:deferred today Plantar:deferred today  DTR: Patellar:deferred today Achilles:deferred today Plantar:deferred today  Palpation:No palpable anomalies  Palpation:No palpable anomalies     Assessment   Status Diagnosis  Persistent Persistent Persistent 1. Cervical facet joint syndrome   2. Inflammatory spondylopathy of cervical region ([place])   3. S/P cervical spinal fusion (C3-C4 ACDF (03/2019)   4. DDD (degenerative disc disease), cervical   5. 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 Spinal stenosis, lumbar region, with neurogenic claudication   7. Chronic radicular cervical pain   8. Lumbar radiculopathy   9. Chronic pain syndrome       Plan of Care  Tammy Lamb has a current medication list which includes the following long-term medication(s): amlodipine, atorvastatin, citalopram, hydrochlorothiazide, metformin, ramipril, sulfasalazine, gabapentin, [START ON 07/16/2020] hydrocodone-acetaminophen, [START ON 08/15/2020] hydrocodone-acetaminophen, and [START ON 09/14/2020] hydrocodone-acetaminophen.  Tammy Lamb presents today for postprocedural evaluation and medication management.  She had diagnostic cervical facet medial branch nerve blocks done which unfortunately were not effective for her neck pain.  Do not recommend subsequent radiofrequency ablation.  Given increased neck pain as well as lumbar radicular pain flare which is making it difficult for her to bear weight, discussed steroid taper with Medrol Dosepak.  Patient has not had any steroid therapy over the last 8 weeks systemic.  We will also refill her hydrocodone as below.  Patient instructed to continue her gabapentin and tizanidine as prescribed.  Refill of gabapentin as below.  Patient states that she does not need a refill of tizanidine.  Follow-up in  3 months for medication management.  Encourage patient to continue basic neck/cervical stretching exercises to help maintain range of motion especially in the winter months in the context of her cervical fusion.  Pharmacotherapy (Medications Ordered): Meds ordered this encounter  Medications  . HYDROcodone-acetaminophen (NORCO) 7.5-325 MG tablet    Sig: Take 1 tablet by mouth every 8 (eight) hours as needed for severe pain. Must last 30 days.    Dispense:  60 tablet    Refill:  0  [place] STOP ACT - Not applicable. Fill one day early if pharmacy is closed on scheduled refill date.  Marland Kitchen HYDROcodone-acetaminophen (NORCO) 7.5-325 MG tablet    Sig: Take 1 tablet by mouth every 8 (eight) hours as needed for severe pain. Must last 30 days.    Dispense:  60 tablet    Refill:  0    [place] STOP ACT - Not applicable. Fill one day early if pharmacy is closed on scheduled refill date.  Marland Kitchen HYDROcodone-acetaminophen (NORCO) 7.5-325 MG tablet    Sig: Take 1 tablet by mouth every 8 (eight) hours as needed for severe pain. Must last 30 days.    Dispense:  60 tablet    Refill:  0    [place] STOP ACT - Not applicable. Fill one day early if pharmacy is closed on scheduled refill date.  . methylPREDNISolone (MEDROL) 4 MG TBPK tablet    Sig: Follow package instructions.    Dispense:  21 tablet    Refill:  0    Do not add to the "Automatic Refill" notification system.  . gabapentin (NEURONTIN) 300 MG capsule    Sig: 300 mg day, 600 mg qhs    Dispense:  90 capsule    Refill:  2    Do not place this medication, or any other prescription from our practice, on "Automatic Refill". Patient may have prescription filled one day early if pharmacy is closed on scheduled refill date.   Follow-up plan:   Return in about 3 months (around 09/20/2020) for Medication Management, in person.   Recent Visits Date Type Provider Dept  06/07/20 Procedure visit Gillis Santa, MD [facility]  Showing recent visits within past 90  days and meeting all other requirements Today's Visits Date Type Provider Dept  06/22/20 Office Visit Gillis Santa, MD [facility]  Showing today's visits and meeting all other requirements Future Appointments Date Type Provider Dept  09/16/20 Appointment Gillis Santa, MD [facility]  Showing future appointments within next 90 days and meeting all other requirements  I discussed the assessment and treatment plan with the patient. The patient was provided an opportunity to ask questions and all were answered. The patient agreed with the plan and demonstrated an understanding of the instructions.  Patient advised to call back or seek an in-person evaluation if the symptoms or condition worsens.  Duration of encounter: 30 minutes.  Note by: Gillis Santa, MD Date: 06/22/2020; Time: 10:22 AM

## 2020-06-22 NOTE — Progress Notes (Signed)
Nursing Pain Medication Assessment:  Safety precautions to be maintained throughout the outpatient stay will include: orient to surroundings, keep bed in low position, maintain call bell within reach at all times, provide assistance with transfer out of bed and ambulation.  Medication Inspection Compliance: Pill count conducted under aseptic conditions, in front of the patient. Neither the pills nor the bottle was removed from the patient's sight at any time. Once count was completed pills were immediately returned to the patient in their original bottle.  Medication: Hydrocodone/APAP Pill/Patch Count: 52 of 60 pills remain Pill/Patch Appearance: Markings consistent with prescribed medication Bottle Appearance: Standard pharmacy container. Clearly labeled. Filled Date: 06-17-2020 Last Medication intake:  Yesterday

## 2020-06-24 ENCOUNTER — Telehealth: Payer: Self-pay | Admitting: Student in an Organized Health Care Education/Training Program

## 2020-06-24 NOTE — Telephone Encounter (Signed)
Having intense pain from neck all the into her legs, right side hurt more. Please call patient to discuss anything she can do to alleviate some of this pain

## 2020-06-25 ENCOUNTER — Telehealth: Payer: Self-pay | Admitting: Student in an Organized Health Care Education/Training Program

## 2020-06-25 NOTE — Telephone Encounter (Signed)
Patient called asking if anyone is going to call her about the extreme pain she is having. Please call

## 2020-06-25 NOTE — Telephone Encounter (Signed)
Patient had procedure on 11-15 and med refill appt 11-30

## 2020-06-28 ENCOUNTER — Telehealth: Payer: Self-pay | Admitting: *Deleted

## 2020-06-28 NOTE — Telephone Encounter (Signed)
Leg pain has somewhat eased up.  The pain in her neck is severe and is very limiting to her activities.  The pain is in her neck and her shoulders.  Last procedure on neck was 06/07/20, bilateral cervical facet which she reports did not help.  Patient is just finishing a steroid taper and that did help her legs but did nothing for her neck pain that goes down into her shoulders.  Reports she is supposed to have surgery on her neck.  She would like to get some pain relief, until she can get the surgery.   She also is taking her pain medication and zanaflex with minimal relief.

## 2020-06-28 NOTE — Telephone Encounter (Signed)
Called patient back to discuss DR Lateef's response to her increased level of pain.  At this point he has nothing he can do to help with this pain, he did say she could increase her gabapentin to 600 mg in the day and 900 mg at night and could utilize her tizanidine more regularly.  She reports that she is already taking tizanidine every 8 hours.  I did caution her that the increase in gabapentin may make her sleepy.  I did refer back to the last note from last visit and there were instructions for stretching to help with ROM.   Reviewed this with patient and she reports that she has tried these but it is sometimes very painful.  She will try again.  WITY reviewed with her to see if this would help and how to perform.  Patient verbalizes u/o information.

## 2020-06-28 NOTE — Telephone Encounter (Signed)
Patient may notice some benefit after she finishes her steroid taper.  At this point, I do not have any immediate interventions for her.  She can increase her gabapentin to 600 mg during the day and 900 mg at night and she can also utilize tizanidine more regularly for her cervical paraspinal pain.

## 2020-07-29 ENCOUNTER — Telehealth: Payer: Self-pay

## 2020-07-29 DIAGNOSIS — G894 Chronic pain syndrome: Secondary | ICD-10-CM

## 2020-07-29 MED ORDER — GABAPENTIN 300 MG PO CAPS: ORAL_CAPSULE | ORAL | 2 refills | Status: DC

## 2020-07-29 NOTE — Telephone Encounter (Signed)
Will you please do this?

## 2020-07-29 NOTE — Telephone Encounter (Signed)
done

## 2020-07-29 NOTE — Telephone Encounter (Signed)
Sorry, there was a phone call from her on 06-28-20, you told her to increase to 633m during the day and 9029mat night.

## 2020-07-29 NOTE — Telephone Encounter (Signed)
Requested Prescriptions   Signed Prescriptions Disp Refills  . gabapentin (NEURONTIN) 300 MG capsule 90 capsule 2    Sig: 300 mg day, 600 mg qhs    Authorizing Provider: Gillis Santa

## 2020-07-29 NOTE — Telephone Encounter (Signed)
She called and said the pharmacy told her she would need a new prescription for gabapentin since Dr. Holley Raring increased the dosage.

## 2020-07-29 NOTE — Addendum Note (Signed)
Addended by: Gillis Santa on: 07/29/2020 01:18 PM   Modules accepted: Orders

## 2020-08-16 ENCOUNTER — Telehealth: Payer: Self-pay

## 2020-08-16 DIAGNOSIS — G894 Chronic pain syndrome: Secondary | ICD-10-CM

## 2020-08-16 NOTE — Telephone Encounter (Signed)
Pt. Needs new prescription (Tizanidine) because Dr. Holley Raring Wants her take every 6 hours in place of every 8 hours

## 2020-08-17 ENCOUNTER — Telehealth: Payer: Self-pay

## 2020-08-17 MED ORDER — TIZANIDINE HCL 4 MG PO TABS: 4.0000 mg | ORAL_TABLET | Freq: Three times a day (TID) | ORAL | 2 refills | Status: DC | PRN

## 2020-08-17 NOTE — Telephone Encounter (Signed)
Patient notified

## 2020-08-17 NOTE — Telephone Encounter (Signed)
I will refill her prescription but I do not want her taking more than 3 tablets in a given day.  Given her age and her other medications, it is best to leave it at 4 mg every 8 hours as it is.  I will resend the prescription.

## 2020-09-16 ENCOUNTER — Ambulatory Visit
Payer: Medicare Other | Attending: Student in an Organized Health Care Education/Training Program | Admitting: Student in an Organized Health Care Education/Training Program

## 2020-09-16 ENCOUNTER — Other Ambulatory Visit: Payer: Self-pay

## 2020-09-16 ENCOUNTER — Encounter: Payer: Self-pay | Admitting: Student in an Organized Health Care Education/Training Program

## 2020-09-16 VITALS — BP 143/97 | HR 80 | Temp 97.1°F | Resp 16 | Ht 66.0 in | Wt 183.0 lb

## 2020-09-16 DIAGNOSIS — M5481 Occipital neuralgia: Secondary | ICD-10-CM | POA: Diagnosis present

## 2020-09-16 DIAGNOSIS — M533 Sacrococcygeal disorders, not elsewhere classified: Secondary | ICD-10-CM | POA: Insufficient documentation

## 2020-09-16 DIAGNOSIS — Z981 Arthrodesis status: Secondary | ICD-10-CM | POA: Diagnosis present

## 2020-09-16 DIAGNOSIS — M5416 Radiculopathy, lumbar region: Secondary | ICD-10-CM

## 2020-09-16 DIAGNOSIS — M961 Postlaminectomy syndrome, not elsewhere classified: Secondary | ICD-10-CM | POA: Diagnosis present

## 2020-09-16 DIAGNOSIS — M5412 Radiculopathy, cervical region: Secondary | ICD-10-CM | POA: Insufficient documentation

## 2020-09-16 DIAGNOSIS — M47812 Spondylosis without myelopathy or radiculopathy, cervical region: Secondary | ICD-10-CM | POA: Diagnosis present

## 2020-09-16 DIAGNOSIS — M48062 Spinal stenosis, lumbar region with neurogenic claudication: Secondary | ICD-10-CM

## 2020-09-16 DIAGNOSIS — M4692 Unspecified inflammatory spondylopathy, cervical region: Secondary | ICD-10-CM | POA: Diagnosis present

## 2020-09-16 DIAGNOSIS — M5030 Other cervical disc degeneration, unspecified cervical region: Secondary | ICD-10-CM

## 2020-09-16 DIAGNOSIS — G894 Chronic pain syndrome: Secondary | ICD-10-CM | POA: Diagnosis present

## 2020-09-16 DIAGNOSIS — M542 Cervicalgia: Secondary | ICD-10-CM

## 2020-09-16 DIAGNOSIS — G8929 Other chronic pain: Secondary | ICD-10-CM

## 2020-09-16 MED ORDER — HYDROCODONE-ACETAMINOPHEN 10-325 MG PO TABS: 1.0000 | ORAL_TABLET | Freq: Two times a day (BID) | ORAL | 0 refills | Status: DC | PRN

## 2020-09-16 MED ORDER — GABAPENTIN 300 MG PO CAPS
ORAL_CAPSULE | ORAL | 2 refills | Status: DC
Start: 2020-09-16 — End: 2020-12-23

## 2020-09-16 NOTE — Progress Notes (Signed)
PROVIDER NOTE: Information contained herein reflects review and annotations entered in association with encounter. Interpretation of such information and data should be left to medically-trained personnel. Information provided to patient can be located elsewhere in the medical record under "Patient Instructions". Document created using STT-dictation technology, any transcriptional errors that may result from process are unintentional.    Patient: Tammy Lamb  Service Category: E/M  Provider: Gillis Santa, MD  DOB: Oct 06, 1950  DOS: 09/16/2020  Specialty: Interventional Pain Management  MRN: [id number]  Setting: Ambulatory outpatient  PCP: Gladstone Lighter, MD  Type: Established Patient    Referring Provider: Gladstone Lighter, MD  Location: Office  Delivery: Face-to-face     HPI  [name], a 71 y.o. year old female, is here today because of her Chronic pain syndrome [G89.4]. [name] primary complain today is Neck Pain Last encounter: My last encounter with her was on 06/25/2020. Pertinent problems: 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 Pain Assessment: Severity of Chronic pain is reported as a 7 /10. Location: Neck  /Pain radiaties down her shoulder and arm, arm become numb on left side. Onset: More than a month ago. Quality: Numbness,Burning,Aching,Throbbing,Constant,Shooting,Sharp,Pressure (back pain). Timing: Constant. Modifying factor(s): meds, heat. Vitals:  height is _0  (1.676 m) and weight is  183 lb (83 kg). Her temporal temperature is 97.1 F (36.2 C) (abnormal). Her blood pressure is 143/97 (abnormal) and her pulse is 80. Her respiration is 16 and oxygen saturation is 99%.   Reason for encounter: medication management.    Patient presents today for medication management.  No significant change in her medical history since her last clinic visit.  She continues to endorse persistent neck pain as well as occipital dominant headaches.  Patient also endorses bilateral low back pain as well as SI joint pain that radiates into her left greater than right groin.  She states that her hydrocodone is becoming less effective and feels that she needs to increase the dose.  Approximately 2 weeks ago increase her gabapentin to 600 mg in the afternoon on 100 mg in the evening when she is finding some relief from that is also helping out with her sleep.  I will refill her hydrocodone as below and we increased the dose from 7.5 to 10 mg twice daily.  Continue gabapentin as prescribed.  As needed order placed for diagnostic bilateral occipital nerve block and/or bilateral sacroiliac injection for occipital neuralgia and SI joint arthritis, SI joint dysfunction/pain respectively.  Pharmacotherapy Assessment   Analgesic: Hydrocodone 7.5 mg BID prn 60/month     Monitoring: [place] PMP: PDMP reviewed during this encounter.       Pharmacotherapy: No side-effects or adverse reactions reported. Compliance: No problems identified. Effectiveness: Clinically acceptable.  Chauncey Fischer, RN  09/16/2020 11:34 AM  Sign when Signing Visit Nursing Pain Medication Assessment:  Safety precautions to be maintained throughout the outpatient stay will include: orient to surroundings, keep bed in low position, maintain call bell within reach at all times, provide assistance with transfer out of bed and ambulation.  Medication Inspection Compliance: Pill count conducted under aseptic conditions, in front of the patient.  Neither the pills nor the bottle was removed from the patient's sight at any time. Once count was completed pills were immediately returned to the patient in their original bottle.  Medication: Hydrocodone/APAP Pill/Patch Count: 13 of 60 pills remain Pill/Patch Appearance: Markings consistent with prescribed medication Bottle Appearance: Standard pharmacy container. Clearly labeled. Filled Date: 1 / 64 / 22 Last Medication intake:  YesterdaySafety precautions to be maintained throughout the outpatient stay will include: orient to surroundings, keep bed in low position, maintain call bell within reach at all times, provide assistance with transfer out of bed and ambulation.     UDS:  Summary  Date Value Ref Range Status  09/23/2019 Note  Final    Comment:    ==================================================================== [employer] Select 13 (MW) ==================================================================== Test                             Result       Flag       Units Drug Present and Declared for Prescription Verification   Hydrocodone                    428          EXPECTED   ng/mg creat   Norhydrocodone                 1180         EXPECTED   ng/mg creat    Sources of hydrocodone include scheduled prescription medications.    Norhydrocodone is an expected metabolite of hydrocodone. ==================================================================== Test                      Result    Flag   Units      Ref Range   Creatinine              25               mg/dL      >=20 ==================================================================== Declared Medications:  The flagging and interpretation on this report are based on the  following declared medications.  Unexpected results may arise from  inaccuracies in the declared medications.  **Note: The testing scope of this panel includes these medications:  Hydrocodone  **Note: The testing scope of this panel does not include  the  following reported medications:  Acetaminophen  Albuterol  Amitriptyline  Amlodipine  Cholecalciferol  Cinnamon  Citalopram  Gabapentin  Hydrochlorothiazide  Hydroxyzine  Melatonin  Nitrofurantoin (Macrobid)  Omeprazole  Oxybutynin  Phenazopyridine (Pyridium)  Phenylephrine  Prednisone  Ramipril  Sodium Bicarbonate  Sulfasalazine  Tizanidine  Tolterodine  Trimethoprim ==================================================================== For clinical consultation, please call [phone]. ====================================================================      ROS  Constitutional: Denies any fever or chills Gastrointestinal: No reported hemesis, hematochezia, vomiting, or acute GI distress Musculoskeletal: Bilateral neck pain, low back pain, bilateral hip pain, (right SI joint pain Neurological: No reported episodes of acute onset apraxia, aphasia, dysarthria, agnosia, amnesia, paralysis, loss of coordination, or loss of consciousness  Medication Review  Cinnamon, HYDROcodone-acetaminophen, Omeprazole-Sodium Bicarbonate, Vitamin B 12, Vitamin D3, acetaminophen, albuterol, amLODipine, atorvastatin, cephALEXin, citalopram, gabapentin, hydrOXYzine, hydrochlorothiazide, magnesium oxide, metFORMIN, pentosan polysulfate, ramipril, sulfaSALAzine, tiZANidine, and vitamin C  History Review  Allergy: [name] is allergic to sulfa antibiotics. Drug: [name]  reports no history of drug use. Alcohol:  reports current alcohol use. Tobacco:  reports that she has been smoking. She has a 24.50 pack-year smoking  history. She has never used smokeless tobacco. Social: [name]  reports that she has been smoking. She has a 24.50 pack-year smoking history. She has never used smokeless tobacco. She reports current alcohol use. She reports that she does not use drugs. Medical:  has a past medical history of Anxiety, Arthritis, Back pain, Colitis, ulcerative ([place]), COPD (chronic  obstructive pulmonary disease) ([place]), Depression, Diabetes mellitus without complication ([place]), GERD (gastroesophageal reflux disease), H/O bladder infections, Headache, Hypertension, Motion sickness, Parkinson's disease ([place]), Stroke ([place]) (2015), TIA (transient ischemic attack) (2015), Urinary frequency, and Wears dentures. Surgical: [name]  has a past surgical history that includes Tonsillectomy; Cholecystectomy; Abdominal hysterectomy; Diagnostic laparoscopy; Bladder suspension; Colonoscopy; Upper gi endoscopy; Knee arthroscopy (Left, 02/13/2014); Ankle arthroscopy (Left, 03/22/2016); Tendon repair (Left, 03/22/2016); Lumbar laminectomy/decompression microdiscectomy (N/A, 05/30/2017); and Anterior cervical decomp/discectomy fusion (N/A, 04/09/2019). Family: family history includes COPD in her father; Diabetes in her mother; Heart disease in her father; Hypertension in her father and mother.  Laboratory Chemistry Profile   Renal Lab Results  Component Value Date   BUN 10 04/02/2019   CREATININE 0.80 10/10/2019   GFRAA >60 04/02/2019   GFRNONAA >60 04/02/2019     Hepatic No results found for: AST, ALT, ALBUMIN, ALKPHOS, HCVAB, AMYLASE, LIPASE, AMMONIA   Electrolytes Lab Results  Component Value Date   NA 134 (L) 04/02/2019   K 3.9 04/02/2019   CL 93 (L) 04/02/2019   CALCIUM 9.2 04/02/2019     Bone No results found for: VD25OH, VD125OH2TOT, WL8937DS2, AJ6811XB2, 25OHVITD1, 25OHVITD2, 25OHVITD3, TESTOFREE, TESTOSTERONE   Inflammation (CRP: Acute Phase) (ESR: Chronic Phase) No results found for: CRP, ESRSEDRATE, LATICACIDVEN     Note: Above Lab results reviewed.    Physical Exam  General appearance: Well nourished, well developed, and well hydrated. In no apparent acute distress Mental status: Alert, oriented x 3 (person, place, & time)       Respiratory: No evidence of acute respiratory distress Eyes: PERLA Vitals: BP (!) 143/97 (BP Location: Left Arm, Patient Position: Sitting,  Cuff Size: Large)   Pulse 80   Temp (!) 97.1 F (36.2 C) (Temporal)   Resp 16   Ht _0  (1.676 m)   Wt 183 lb (83 kg)   SpO2 99%   BMI 29.54 kg/m  BMI: Estimated body mass index is 29.54 kg/m as calculated from the following:   Height as of this encounter: _1  (1.676 m).   Weight as of this encounter: 183 lb (83 kg). Ideal: Ideal body weight: 59.3 kg (130 lb 11.7 oz) Adjusted ideal body weight: 68.8 kg (151 lb 10.2 oz)  Cervical Spine Area Exam  Skin & Axial Inspection: Well healed scar from previous spine surgery detected Alignment: Symmetrical Functional ROM: Pain restricted ROM      Stability: No instability detected Muscle Tone/Strength: Functionally intact. No obvious neuro-muscular anomalies detected. Sensory (Neurological): Neurogenic pain pattern  Lumbar Spine Area Exam  Skin & Axial Inspection: Well healed scar from previous spine surgery detected Alignment: Scoliosis detected Functional ROM: Restricted ROM       Stability: No instability detected Muscle Tone/Strength: Functionally intact. No obvious neuro-muscular anomalies detected. Sensory (Neurological): Neurogenic pain pattern Palpation: No palpable anomalies       Provocative Tests: Hyperextension/rotation test: (+) bilaterally for facet joint pain. Lumbar quadrant test (Kemp's test): deferred today       Lateral bending test: deferred today       Patrick's Maneuver: (+) for bilateral S-I arthralgia  FABER* test: (+) for bilateral S-I arthralgia             S-I anterior distraction/compression test: (+)   S-I arthralgia/arthropathy S-I lateral compression test: (+)   S-I arthralgia/arthropathy S-I Thigh-thrust test: deferred today         S-I Gaenslen's test: deferred today         *(Flexion, ABduction and External Rotation) Gait & Posture Assessment  Ambulation: Limited Gait: Antalgic gait (limping) Posture: Difficulty standing up straight, due to pain  Lower Extremity Exam    Side: Right  lower extremity  Side: Left lower extremity  Stability: No instability observed          Stability: No instability observed          Skin & Extremity Inspection: Skin color, temperature, and hair growth are WNL. No peripheral edema or cyanosis. No masses, redness, swelling, asymmetry, or associated skin lesions. No contractures.  Skin & Extremity Inspection: Skin color, temperature, and hair growth are WNL. No peripheral edema or cyanosis. No masses, redness, swelling, asymmetry, or associated skin lesions. No contractures.  Functional ROM: Pain restricted ROM for hip and knee joints          Functional ROM: Pain restricted ROM for hip and knee joints          Muscle Tone/Strength: Functionally intact. No obvious neuro-muscular anomalies detected.  Muscle Tone/Strength: Functionally intact. No obvious neuro-muscular anomalies detected.  Sensory (Neurological): Musculoskeletal pain pattern        Sensory (Neurological): Musculoskeletal pain pattern        DTR: Patellar: deferred today Achilles: deferred today Plantar: deferred today  DTR: Patellar: deferred today Achilles: deferred today Plantar: deferred today  Palpation: No palpable anomalies  Palpation: No palpable anomalies     Assessment   Status Diagnosis  Persistent Having a Flare-up Having a Flare-up 1. Chronic pain syndrome   2. Bilateral occipital neuralgia   3. Chronic SI joint pain   4. Cervical facet joint syndrome   5. Inflammatory spondylopathy of cervical region ([place])   6. S/P cervical spinal fusion (C3-C4 ACDF (03/2019)   7. DDD (degenerative disc disease), cervical   8. 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 Chronic radicular cervical pain   10. Lumbar radiculopathy   11. Spinal stenosis, lumbar region, with neurogenic claudication   12. Cervicalgia      Updated Problems: Problem  Bilateral Occipital Neuralgia  Chronic Si Joint Pain    Plan of Care  [name] has a current  medication list which includes the following long-term medication(s): amlodipine, atorvastatin, citalopram, metformin, ramipril, sulfasalazine, gabapentin, and hydrochlorothiazide.  Pharmacotherapy (Medications Ordered): Meds ordered this encounter  Medications  . HYDROcodone-acetaminophen (NORCO) 10-325 MG tablet    Sig: Take 1 tablet by mouth 2 (two) times daily as needed for severe pain. Must last 30 days.    Dispense:  60 tablet    Refill:  0    Chronic Pain. (STOP Act - Not applicable). Fill one day early if closed on scheduled refill date.  Marland Kitchen HYDROcodone-acetaminophen (NORCO) 10-325 MG tablet    Sig: Take 1 tablet by mouth 2 (two) times daily as needed for severe pain. Must last 30 days.    Dispense:  60 tablet    Refill:  0    Chronic Pain. (STOP Act - Not applicable). Fill one day early if closed on scheduled refill date.  . gabapentin (NEURONTIN) 300 MG capsule    Sig:  600 mg day, 900 mg qhs    Dispense:  150 capsule    Refill:  2    Do not place this medication, or any other prescription from our practice, on "Automatic Refill". Patient may have prescription filled one day early if pharmacy is closed on scheduled refill date.   Orders:  Orders Placed This Encounter  Procedures  . GREATER OCCIPITAL NERVE BLOCK    Standing Status:   Standing    Number of Occurrences:   6    Standing Expiration Date:   09/16/2021    Scheduling Instructions:     Procedure: Occipital nerve block     Laterality: Bilateral     Sedation: Patient's choice.     Timeframe: PRN Procedure. Patient will call.    Order Specific Question:   Where will this procedure be performed?    Answer:   [facility]  . SACROILIAC JOINT INJECTION    Standing Status:   Future    Standing Expiration Date:   10/14/2020    Scheduling Instructions:     Side: Bilateral     Sedation: Patient's choice.     Timeframe: ASAP    Order Specific Question:   Where will this procedure be performed?    Answer:   [facility]  . ToxASSURE Select 13 (MW), Urine    Volume: 30 ml(s). Minimum 3 ml of urine is needed. Document temperature of fresh sample. Indications: Long term (current) use of opiate analgesic [phone])    Order Specific Question:   Release to patient    Answer:   Immediate   Follow-up plan:   Return in about 4 months (around 01/04/2021) for Medication Management, in person.   Recent Visits Date Type Provider Dept  06/22/20 Office Visit Gillis Santa, MD [facility]  Showing recent visits within past 90 days and meeting all other requirements Today's Visits Date Type Provider Dept  09/16/20 Office Visit Gillis Santa, MD [facility]  Showing today's visits and meeting all other requirements Future Appointments No visits were found meeting these conditions. Showing future appointments within next 90 days and meeting all other requirements  I discussed the assessment and treatment plan with the patient. The patient was provided an opportunity to ask questions and all were answered. The patient agreed with the plan and demonstrated an understanding of the instructions.  Patient advised to call back or seek an in-person evaluation if the symptoms or condition worsens.  Duration of encounter: 17mnutes.  Note by: BGillis Santa MD Date: 09/16/2020; Time: 12:42 PM

## 2020-09-16 NOTE — Progress Notes (Signed)
Nursing Pain Medication Assessment:  Safety precautions to be maintained throughout the outpatient stay will include: orient to surroundings, keep bed in low position, maintain call bell within reach at all times, provide assistance with transfer out of bed and ambulation.  Medication Inspection Compliance: Pill count conducted under aseptic conditions, in front of the patient. Neither the pills nor the bottle was removed from the patient's sight at any time. Once count was completed pills were immediately returned to the patient in their original bottle.  Medication: Hydrocodone/APAP Pill/Patch Count: 13 of 60 pills remain Pill/Patch Appearance: Markings consistent with prescribed medication Bottle Appearance: Standard pharmacy container. Clearly labeled. Filled Date: 1 / 49 / 22 Last Medication intake:  YesterdaySafety precautions to be maintained throughout the outpatient stay will include: orient to surroundings, keep bed in low position, maintain call bell within reach at all times, provide assistance with transfer out of bed and ambulation.

## 2020-09-21 ENCOUNTER — Telehealth: Payer: Self-pay

## 2020-09-21 NOTE — Telephone Encounter (Signed)
Returned call. No answer. LVM.

## 2020-09-21 NOTE — Telephone Encounter (Signed)
Pt wants to know what meds to take before her procedure tomorrow morning

## 2020-09-22 ENCOUNTER — Other Ambulatory Visit: Payer: Self-pay

## 2020-09-22 ENCOUNTER — Ambulatory Visit
Payer: Medicare Other | Attending: Student in an Organized Health Care Education/Training Program | Admitting: Student in an Organized Health Care Education/Training Program

## 2020-09-22 ENCOUNTER — Encounter: Payer: Self-pay | Admitting: Student in an Organized Health Care Education/Training Program

## 2020-09-22 VITALS — BP 135/85 | HR 80 | Temp 97.1°F | Resp 16 | Ht 65.0 in | Wt 183.0 lb

## 2020-09-22 DIAGNOSIS — G894 Chronic pain syndrome: Secondary | ICD-10-CM | POA: Diagnosis present

## 2020-09-22 DIAGNOSIS — M5481 Occipital neuralgia: Secondary | ICD-10-CM | POA: Insufficient documentation

## 2020-09-22 MED ORDER — DEXAMETHASONE SODIUM PHOSPHATE 10 MG/ML IJ SOLN
10.0000 mg | Freq: Once | INTRAMUSCULAR | Status: AC
  Administered 2020-09-22: 10 mg
  Filled 2020-09-22: qty 1

## 2020-09-22 MED ORDER — ROPIVACAINE HCL 2 MG/ML IJ SOLN
INTRAMUSCULAR | Status: AC
  Filled 2020-09-22: qty 10

## 2020-09-22 MED ORDER — LIDOCAINE HCL (PF) 2 % IJ SOLN
INTRAMUSCULAR | Status: AC
  Filled 2020-09-22: qty 10

## 2020-09-22 NOTE — Patient Instructions (Addendum)

## 2020-09-22 NOTE — Progress Notes (Signed)
1010 Procedure completed, began to sit up, complained of dizziness and nausea. Dr. Holley Raring notified. Will monitor in Recovery Room, offer food and drink. Patient became dizzy in procedure room.  Brought to recovery room to monitor.  VSS.  Tolerating po fluids and eating crackers.  Assessed by Dr Holley Raring and orders to discharge patient received.

## 2020-09-22 NOTE — Progress Notes (Signed)
Safety precautions to be maintained throughout the outpatient stay will include: orient to surroundings, keep bed in low position, maintain call bell within reach at all times, provide assistance with transfer out of bed and ambulation.  

## 2020-09-22 NOTE — Progress Notes (Signed)
PROVIDER NOTE: Information contained herein reflects review and annotations entered in association with encounter. Interpretation of such information and data should be left to medically-trained personnel. Information provided to patient can be located elsewhere in the medical record under "Patient Instructions". Document created using STT-dictation technology, any transcriptional errors that may result from process are unintentional.    Patient: Tammy Lamb  Service Category: Procedure  Provider: Gillis Santa, MD  DOB: 03-18-1951  DOS: 09/22/2020  Location: [facility]  MRN: [id number]  Setting: Ambulatory - outpatient  Referring Provider: Gladstone Lighter, MD  Type: Established Patient  Specialty: Interventional Pain Management  PCP: Gladstone Lighter, MD   Primary Reason for Visit: Interventional Pain Management Treatment. CC: headache + neck pain   Procedure:          Anesthesia, Analgesia, Anxiolysis:  Type: Diagnostic, Greater, Occipital Nerve Block  #1  Region: Posterolateral Cervical Level: Occipital Ridge   Laterality: Bilateral  Type: Local Anesthesia  Local Anesthetic: Lidocaine 1-2%  Position: Prone   Indications: 1. Bilateral occipital neuralgia   2. Chronic pain syndrome    Pain Score: Pre-procedure: 4 /10 Post-procedure: 0-No pain/10   Pre-op H&P Assessment:  Tammy Lamb is a 70 y.o. (year old), female patient, seen today for interventional treatment. She  has a past surgical history that includes Tonsillectomy; Cholecystectomy; Abdominal hysterectomy; Diagnostic laparoscopy; Bladder suspension; Colonoscopy; Upper gi endoscopy; Knee arthroscopy (Left, 02/13/2014); Ankle arthroscopy (Left, 03/22/2016); Tendon repair (Left, 03/22/2016); Lumbar laminectomy/decompression microdiscectomy (N/A, 05/30/2017); and Anterior cervical decomp/discectomy fusion (N/A, 04/09/2019). Tammy Lamb has a current medication list which includes the following prescription(s):  acetaminophen, amlodipine, vitamin c, atorvastatin, cephalexin, vitamin d3, cinnamon, citalopram, vitamin b 12, gabapentin, [START ON 10/22/2020] hydrocodone-acetaminophen, [START ON 11/21/2020] hydrocodone-acetaminophen, hydroxyzine, magnesium oxide, metformin, omeprazole-sodium bicarbonate, ramipril, sulfasalazine, tizanidine, ventolin hfa, hydrochlorothiazide, and elmiron. Her primarily concern today is the No chief complaint on file.  Initial Vital Signs:  Pulse/HCG Rate: 80ECG Heart Rate: 76 Temp: (!) 97.1 F (36.2 C) Resp: 18 BP: (!) 140/97 SpO2: 100 %  BMI: Estimated body mass index is 30.45 kg/m as calculated from the following:   Height as of this encounter: 5' 5"  (1.651 m).   Weight as of this encounter: 183 lb (83 kg).  Risk Assessment: Allergies: Reviewed. She is allergic to sulfa antibiotics.  Allergy Precautions: None required Coagulopathies: Reviewed. None identified.  Blood-thinner therapy: None at this time Active Infection(s): Reviewed. None identified. Tammy Lamb is afebrile  Site Confirmation: Tammy Lamb was asked to confirm the procedure and laterality before marking the site Procedure checklist: Completed Consent: Before the procedure and under the influence of no sedative(s), amnesic(s), or anxiolytics, the patient was informed of the treatment options, risks and possible complications. To fulfill our ethical and legal obligations, as recommended by the American Medical Association's Code of Ethics, I have informed the patient of my clinical impression; the nature and purpose of the treatment or procedure; the risks, benefits, and possible complications of the intervention; the alternatives, including doing nothing; the risk(s) and benefit(s) of the alternative treatment(s) or procedure(s); and the risk(s) and benefit(s) of doing nothing. The patient was provided information about the general risks and possible complications associated with the procedure. These may include,  but are not limited to: failure to achieve desired goals, infection, bleeding, organ or nerve damage, allergic reactions, paralysis, and death. In addition, the patient was informed of those risks and complications associated to the procedure, such as failure to decrease pain; infection; bleeding; organ or  nerve damage with subsequent damage to sensory, motor, and/or autonomic systems, resulting in permanent pain, numbness, and/or weakness of one or several areas of the body; allergic reactions; (i.e.: anaphylactic reaction); and/or death. Furthermore, the patient was informed of those risks and complications associated with the medications. These include, but are not limited to: allergic reactions (i.e.: anaphylactic or anaphylactoid reaction(s)); adrenal axis suppression; blood sugar elevation that in diabetics may result in ketoacidosis or comma; water retention that in patients with history of congestive heart failure may result in shortness of breath, pulmonary edema, and decompensation with resultant heart failure; weight gain; swelling or edema; medication-induced neural toxicity; particulate matter embolism and blood vessel occlusion with resultant organ, and/or nervous system infarction; and/or aseptic necrosis of one or more joints. Finally, the patient was informed that Medicine is not an exact science; therefore, there is also the possibility of unforeseen or unpredictable risks and/or possible complications that may result in a catastrophic outcome. The patient indicated having understood very clearly. We have given the patient no guarantees and we have made no promises. Enough time was given to the patient to ask questions, all of which were answered to the patient's satisfaction. Tammy Lamb has indicated that she wanted to continue with the procedure. Attestation: I, the ordering provider, attest that I have discussed with the patient the benefits, risks, side-effects, alternatives, likelihood of  achieving goals, and potential problems during recovery for the procedure that I have provided informed consent. Date  Time: 09/22/2020  8:42 AM  Pre-Procedure Preparation:  Monitoring: As per clinic protocol. Respiration, ETCO2, SpO2, BP, heart rate and rhythm monitor placed and checked for adequate function Safety Precautions: Patient was assessed for positional comfort and pressure points before starting the procedure. Time-out: I initiated and conducted the "Time-out" before starting the procedure, as per protocol. The patient was asked to participate by confirming the accuracy of the "Time Out" information. Verification of the correct person, site, and procedure were performed and confirmed by me, the nursing staff, and the patient. "Time-out" conducted as per Joint Commission's Universal Protocol (UP.01.01.01). Time: 1004  Description of Procedure:          Target Area: Area medial to the occipital artery at the level of the superior nuchal ridge Approach: Posterior approach Area Prepped: Entire Posterior Occipital Region DuraPrep (Iodine Povacrylex [0.7% available iodine] and Isopropyl Alcohol, 74% w/w) Safety Precautions: Aspiration looking for blood return was conducted prior to all injections. At no point did we inject any substances, as a needle was being advanced. No attempts were made at seeking any paresthesias. Safe injection practices and needle disposal techniques used. Medications properly checked for expiration dates. SDV (single dose vial) medications used. Description of the Procedure: Protocol guidelines were followed. The target area was identified and the area prepped in the usual manner. Skin & deeper tissues infiltrated with local anesthetic. Appropriate amount of time allowed to pass for local anesthetics to take effect. The procedure needles were then advanced to the target area. Proper needle placement secured. Negative aspiration confirmed. Solution injected in  intermittent fashion, asking for systemic symptoms every 0.5cc of injectate. The needles were then removed and the area cleansed, making sure to leave some of the prepping solution back to take advantage of its long term bactericidal properties.  Vitals:   09/22/20 1008 09/22/20 1015 09/22/20 1021 09/22/20 1028  BP: 138/88 (!) 143/82 (!) 143/95 135/85  Pulse:      Resp: 15 16 16 16   Temp:  SpO2: 99% 98% 99% 98%  Weight:      Height:        Start Time: 1005 hrs. End Time: 1008 hrs. Materials:  Needle(s) Type: Regular needle Gauge: 25G Length: 1.5-in Medication(s): Please see orders for medications and dosing details. 5 cc solution made of 4 cc of 1% lidocaine, 1 cc of Decadron 10 mg/cc.  2.5 cc injected for the left greater occipital nerve, 2.5 cc injected for the right greater occipital nerve.   Post-operative Assessment:  Post-procedure Vital Signs:  Pulse/HCG Rate: 8075 Temp: (!) 97.1 F (36.2 C) Resp: 16 BP: 135/85 SpO2: 98 %  EBL: None  Complications: No immediate post-treatment complications observed by team, or reported by patient.  Note: The patient tolerated the entire procedure well. A repeat set of vitals were taken after the procedure and the patient was kept under observation following institutional policy, for this type of procedure. Post-procedural neurological assessment was performed, showing return to baseline, prior to discharge. The patient was provided with post-procedure discharge instructions, including a section on how to identify potential problems. Should any problems arise concerning this procedure, the patient was given instructions to immediately contact us, at any time, without hesitation. In any case, we plan to contact the patient by telephone for a follow-up status report regarding this interventional procedure.  Comments:  No additional relevant information.  Plan of Care   Chronic Opioid Analgesic:  Hydrocodone 7.5 mg BID prn 60/month      Medications ordered for procedure: Meds ordered this encounter  Medications  . dexamethasone (DECADRON) injection 10 mg   Medications administered: We administered dexamethasone.  See the medical record for exact dosing, route, and time of administration.  Follow-up plan:   Return in about 3 weeks (around 10/13/2020) for Post Procedure Evaluation, virtual.    Recent Visits Date Type Provider Dept  09/16/20 Office Visit Gillis Santa, MD [facility]  Showing recent visits within past 90 days and meeting all other requirements Today's Visits Date Type Provider Dept  09/22/20 Procedure visit Gillis Santa, MD [facility]  Showing today's visits and meeting all other requirements Future Appointments Date Type Provider Dept  10/13/20 Appointment Gillis Santa, MD [facility]  Showing future appointments within next 90 days and meeting all other requirements  Disposition: Discharge home  Discharge (Date  Time): 09/22/2020; 1030 hrs.   Primary Care Physician: Gladstone Lighter, MD Location: [facility] Note by: Gillis Santa, MD Date: 09/22/2020; Time: 1:11 PM  Disclaimer:  Medicine is not an exact science. The only guarantee in medicine is that nothing is guaranteed. It is important to note that the decision to proceed with this intervention was based on the information collected from the patient. The Data and conclusions were drawn from the patient's questionnaire, the interview, and the physical examination. Because the information was provided in large part by the patient, it cannot be guaranteed that it has not been purposely or unconsciously manipulated. Every effort has been made to obtain as much relevant data as possible for this evaluation. It is important to note that the conclusions that lead to this procedure are derived in large part from the available data. Always take into account that the treatment will also be  dependent on availability of resources and existing treatment guidelines, considered by other Pain Management Practitioners as being common knowledge and practice, at the time of the intervention. For Medico-Legal purposes, it is also important to point out that variation in  procedural techniques and pharmacological choices are the acceptable norm. The indications, contraindications, technique, and results of the above procedure should only be interpreted and judged by a Board-Certified Interventional Pain Specialist with extensive familiarity and expertise in the same exact procedure and technique.

## 2020-09-23 ENCOUNTER — Telehealth: Payer: Self-pay | Admitting: *Deleted

## 2020-09-23 NOTE — Telephone Encounter (Signed)
Denies any post procedure issues.

## 2020-09-25 LAB — TOXASSURE SELECT 13 (MW), URINE

## 2020-10-13 ENCOUNTER — Telehealth: Payer: Medicare Other | Admitting: Student in an Organized Health Care Education/Training Program

## 2020-10-19 ENCOUNTER — Encounter: Payer: Self-pay | Admitting: Student in an Organized Health Care Education/Training Program

## 2020-10-20 ENCOUNTER — Ambulatory Visit
Payer: Medicare Other | Attending: Student in an Organized Health Care Education/Training Program | Admitting: Student in an Organized Health Care Education/Training Program

## 2020-10-20 ENCOUNTER — Other Ambulatory Visit: Payer: Self-pay

## 2020-10-20 DIAGNOSIS — M47812 Spondylosis without myelopathy or radiculopathy, cervical region: Secondary | ICD-10-CM

## 2020-10-20 DIAGNOSIS — M5481 Occipital neuralgia: Secondary | ICD-10-CM

## 2020-10-20 DIAGNOSIS — M4692 Unspecified inflammatory spondylopathy, cervical region: Secondary | ICD-10-CM | POA: Diagnosis not present

## 2020-10-20 DIAGNOSIS — Z981 Arthrodesis status: Secondary | ICD-10-CM

## 2020-10-20 DIAGNOSIS — G894 Chronic pain syndrome: Secondary | ICD-10-CM

## 2020-10-20 DIAGNOSIS — M5030 Other cervical disc degeneration, unspecified cervical region: Secondary | ICD-10-CM

## 2020-10-20 NOTE — Progress Notes (Signed)
Patient: Tammy Lamb  Service Category: E/M  Provider: Gillis Santa, MD  DOB: 05-10-1951  DOS: 10/20/2020  Location: Office  MRN: [id number]  Setting: Ambulatory outpatient  Referring Provider: Gladstone Lighter, MD  Type: Established Patient  Specialty: Interventional Pain Management  PCP: Gladstone Lighter, MD  Location: Home  Delivery: TeleHealth     Virtual Encounter - Pain Management PROVIDER NOTE: Information contained herein reflects review and annotations entered in association with encounter. Interpretation of such information and data should be left to medically-trained personnel. Information provided to patient can be located elsewhere in the medical record under "Patient Instructions". Document created using STT-dictation technology, any transcriptional errors that may result from process are unintentional.    Contact & Pharmacy Preferred: [phone] Home: [phone] (home) Mobile: [phone] (mobile) E-mail: [email or web address]  [place], [place] [address] [place] [address] Phone: [phone] Fax: [phone]  [id number] [id number]: [phone]: [phone]  Pre-screening  Tammy Lamb offered "in-person" vs "virtual" encounter. She indicated preferring virtual for this encounter.   Reason COVID-19*  Social distancing based on CDC and AMA recommendations.   I contacted Ngina D Kosch on 10/20/2020 via video conference.      I clearly identified myself as BGillis Santa MD. I verified that I was speaking with the correct person using two identifiers (Name: Tammy Lamb and date of birth: [birth year].  Consent I sought verbal advanced consent from [place] interactions. I informed [name] of possible security and privacy concerns, risks, and limitations associated with  providing "not-in-person" medical evaluation and management services. I also informed [name] of the availability of "in-person" appointments. Finally, I informed her that there would be a charge for the virtual visit and that she could be  personally, fully or partially, financially responsible for it. Tammy Lamb understanding and agreed to proceed.   Historic Elements   Tammy Lamb is a 70y.o. year old, female patient evaluated today after our last contact on 09/22/2020. [name] has a past medical history of Anxiety, Arthritis, Back pain, Colitis, ulcerative ([place], COPD (chronic obstructive pulmonary disease) ([place], Depression, Diabetes mellitus without complication ([place], GERD (gastroesophageal reflux disease), H/O bladder infections, Headache, Hypertension, Motion sickness, Parkinson's disease ([place], Stroke ([place] (2015), TIA (transient ischemic attack) (2015), Urinary frequency, and Wears dentures. She also  has a past surgical history that includes Tonsillectomy; Cholecystectomy; Abdominal hysterectomy; Diagnostic laparoscopy; Bladder suspension; Colonoscopy; Upper gi endoscopy; Knee arthroscopy (Left, 02/13/2014); Ankle arthroscopy (Left, 03/22/2016); Tendon repair (Left, 03/22/2016); Lumbar laminectomy/decompression microdiscectomy (N/A, 05/30/2017); and Anterior cervical decomp/discectomy fusion (N/A, 04/09/2019). Ms. PLangsethhas a current medication list which includes the following prescription(s): acetaminophen, amlodipine, vitamin c, atorvastatin, cephalexin, vitamin d3, cinnamon, citalopram, vitamin b 12, gabapentin, [START ON 10/22/2020] hydrocodone-acetaminophen, [START ON 11/21/2020] hydrocodone-acetaminophen, hydroxyzine, magnesium oxide, metformin, omeprazole-sodium bicarbonate, elmiron, ramipril, sulfasalazine, tizanidine, ventolin hfa, and hydrochlorothiazide. She  reports that she has been smoking. She has a 24.50 pack-year smoking history. She has never used smokeless tobacco. She  reports current alcohol use. She reports that she does not use drugs. Ms. PGattusois allergic to sulfa antibiotics.   HPI  Today, she is being contacted for a post-procedure assessment.    Post-Procedure Evaluation  Procedure (09/22/2020):  Type: Diagnostic, Greater, Occipital Nerve Block  #1  Region: Posterolateral Cervical Level: Occipital  Ridge   Laterality: Bilateral  Sedation: Please see nurses note.  Effectiveness during initial hour after procedure(Ultra-Short Term Relief): 0 % (states she was very dizzy and felt bad)   Local anesthetic used: Long-acting (4-6 hours) Effectiveness: Defined as any analgesic benefit obtained secondary to the administration of local anesthetics. This carries significant diagnostic value as to the etiological location, or anatomical origin, of the pain. Duration of benefit is expected to coincide with the duration of the local anesthetic used.  Effectiveness during initial 4-6 hours after procedure(Short-Term Relief): 0 %  Long-term benefit: Defined as any relief past the pharmacologic duration of the local anesthetics.  Effectiveness past the initial 6 hours after procedure(Long-Term Relief): 0 % (States she has been hurting worse.)   Current benefits: Defined as benefit that persist at this time.   Analgesia:  No benefit   Pharmacotherapy Assessment  Analgesic: Hydrocodone 7.5 mg BID prn 60/month     Monitoring: [place] PMP: PDMP reviewed during this encounter.       Pharmacotherapy: No side-effects or adverse reactions reported. Compliance: No problems identified. Effectiveness: Clinically acceptable. Plan: Refer to "POC".  UDS:  Summary  Date Value Ref Range Status  09/16/2020 Note  Final    Comment:    ==================================================================== [employer] Select 13 (MW) ==================================================================== Test                             Result       Flag       Units  Drug Present  and Declared for Prescription Verification   Hydrocodone                    378          EXPECTED   ng/mg creat   Norhydrocodone                 1419         EXPECTED   ng/mg creat    Sources of hydrocodone include scheduled prescription medications.    Norhydrocodone is an expected metabolite of hydrocodone.  ==================================================================== Test                      Result    Flag   Units      Ref Range   Creatinine              36               mg/dL      >=20 ==================================================================== Declared Medications:  The flagging and interpretation on this report are based on the  following declared medications.  Unexpected results may arise from  inaccuracies in the declared medications.   **Note: The testing scope of this panel includes these medications:   Hydrocodone   **Note: The testing scope of this panel does not include the  following reported medications:   Acetaminophen  Albuterol  Amlodipine  Atorvastatin  Cephalexin  Cinnamon  Citalopram  Gabapentin  Hydrochlorothiazide  Hydroxyzine  Magnesium (Mag-Ox)  Metformin  Omeprazole  Pentosan (Elmiron)  Ramipril  Sodium Bicarbonate  Sulfasalazine  Tizanidine  Vitamin B12  Vitamin C  Vitamin D3 ==================================================================== For clinical consultation, please call [phone]. ====================================================================     Laboratory Chemistry Profile   Renal Lab Results  Component Value Date   BUN 10 04/02/2019   CREATININE 0.80 10/10/2019   GFRAA >60 04/02/2019   GFRNONAA >60 04/02/2019  Hepatic No results found for: AST, ALT, ALBUMIN, ALKPHOS, HCVAB, AMYLASE, LIPASE, AMMONIA   Electrolytes Lab Results  Component Value Date   NA 134 (L) 04/02/2019   K 3.9 04/02/2019   CL 93 (L) 04/02/2019   CALCIUM 9.2 04/02/2019     Bone No results found for: VD25OH,  [id number], [id number], [id number], 25OHVITD1, 25OHVITD2, 25OHVITD3, TESTOFREE, TESTOSTERONE   Inflammation (CRP: Acute Phase) (ESR: Chronic Phase) No results found for: CRP, ESRSEDRATE, LATICACIDVEN     Note: Above Lab results reviewed.   Assessment  The primary encounter diagnosis was Bilateral occipital neuralgia. Diagnoses of Cervical facet joint syndrome, Inflammatory spondylopathy of cervical region [facility]), S/P cervical spinal fusion (C3-C4 ACDF (03/2019), DDD (degenerative disc disease), cervical, and Chronic pain syndrome were also pertinent to this visit.  Plan of Care  Ms. Azha D Marcell has a current medication list which includes the following long-term medication(s): amlodipine, atorvastatin, citalopram, gabapentin, hydrochlorothiazide, metformin, ramipril, and sulfasalazine.  Unfortunately, patient did not receive any significant benefit after her diagnostic occipital nerve block, I do not recommend any further occipital nerve interventions including RFA.  She has an appointment with ENT next month for evaluation of her vertigo.  I will see her back in June for medication refill.   Follow-up plan:   Return for Keep sch. appt.   Recent Visits Date Type Provider Dept  09/22/20 Procedure visit Gillis Santa, MD [facility]  09/16/20 Office Visit Gillis Santa, MD [facility]  Showing recent visits within past 90 days and meeting all other requirements Today's Visits Date Type Provider Dept  10/20/20 Telemedicine Gillis Santa, MD [facility]  Showing today's visits and meeting all other requirements Future Appointments Date Type Provider Dept  01/04/21 Appointment Gillis Santa, MD [facility]  Showing future appointments within next 90 days and meeting all other requirements  I discussed the assessment and treatment plan with the patient. The patient was provided an opportunity to ask questions and all were answered. The patient agreed  with the plan and demonstrated an understanding of the instructions.  Patient advised to call back or seek an in-person evaluation if the symptoms or condition worsens.  Duration of encounter: 20 minutes.  Note by: Gillis Santa, MD Date: 10/20/2020; Time: 1:33 PM

## 2020-11-08 ENCOUNTER — Telehealth: Payer: Self-pay | Admitting: Student in an Organized Health Care Education/Training Program

## 2020-11-08 NOTE — Telephone Encounter (Signed)
Patient is scheduled for vertigo but has to stop muscle relaxer and pain meds 3 days. Wants advice on this.

## 2020-11-08 NOTE — Telephone Encounter (Signed)
Having a "vertigo test", and was instructed to discontinue pain medications and muscle relaxers for 3 days prior. Patient unsure if she can do this, and asked what she should do. I informed her that completion of the test is her decision, and she must decide if she can discontinue the medications.

## 2020-11-11 ENCOUNTER — Other Ambulatory Visit: Payer: Self-pay | Admitting: Student in an Organized Health Care Education/Training Program

## 2020-11-11 DIAGNOSIS — G894 Chronic pain syndrome: Secondary | ICD-10-CM

## 2020-11-19 ENCOUNTER — Telehealth: Payer: Self-pay | Admitting: Student in an Organized Health Care Education/Training Program

## 2020-11-22 ENCOUNTER — Other Ambulatory Visit: Payer: Self-pay | Admitting: Pain Medicine

## 2020-11-22 DIAGNOSIS — G894 Chronic pain syndrome: Secondary | ICD-10-CM

## 2020-11-22 MED ORDER — HYDROCODONE-ACETAMINOPHEN 10-325 MG PO TABS: 1.0000 | ORAL_TABLET | Freq: Two times a day (BID) | ORAL | 0 refills | Status: DC | PRN

## 2020-11-22 NOTE — Progress Notes (Signed)
Today I have sent a prescription to the patient's pharmacy.  Patient indicates that the pharmacy did not have the medication despite the fact that the EPIC medication report reads as follows:  Outpatient Medication Detail   Disp Refills Start End   HYDROcodone-acetaminophen (NORCO) 10-325 MG tablet 60 tablet 0 11/21/2020 12/21/2020   Sig - Route: Take 1 tablet by mouth 2 (two) times daily as needed for severe pain. Must last 30 days. - Oral   Sent to pharmacy as: HYDROcodone-acetaminophen ([place]) 10-325 MG tablet   Earliest Fill Date: 11/21/2020   Notes to Pharmacy: Chronic Pain. (STOP Act - Not applicable). Fill one day early if closed on scheduled refill date.   E-Prescribing Status: Receipt confirmed by pharmacy (09/16/2020 11:59 AM EST)    Nurse called pharmacy and they confirmed that they did not have the prescription.  Prescription rewritten and sent to pharmacy while covering for Dr. Holley Raring during vacation time.

## 2020-12-23 ENCOUNTER — Telehealth: Payer: Self-pay | Admitting: Student in an Organized Health Care Education/Training Program

## 2020-12-23 ENCOUNTER — Other Ambulatory Visit: Payer: Self-pay | Admitting: Pain Medicine

## 2020-12-23 DIAGNOSIS — M5412 Radiculopathy, cervical region: Secondary | ICD-10-CM

## 2020-12-23 DIAGNOSIS — G894 Chronic pain syndrome: Secondary | ICD-10-CM

## 2020-12-23 DIAGNOSIS — G8929 Other chronic pain: Secondary | ICD-10-CM

## 2020-12-23 DIAGNOSIS — M5416 Radiculopathy, lumbar region: Secondary | ICD-10-CM

## 2020-12-23 MED ORDER — GABAPENTIN 300 MG PO CAPS: ORAL_CAPSULE | ORAL | 2 refills | Status: DC

## 2020-12-23 MED ORDER — HYDROCODONE-ACETAMINOPHEN 10-325 MG PO TABS: 1.0000 | ORAL_TABLET | Freq: Two times a day (BID) | ORAL | 0 refills | Status: DC | PRN

## 2020-12-23 MED ORDER — TIZANIDINE HCL 4 MG PO TABS: 4.0000 mg | ORAL_TABLET | Freq: Three times a day (TID) | ORAL | 2 refills | Status: DC | PRN

## 2020-12-23 NOTE — Progress Notes (Signed)
Hydrocodone/APAP, gabapentin, and tizanidine prescriptions sent to the pharmacy today, while covering for Dr. Gillis Santa (PTO).  Patient will need to be scheduled to see him again before 01/22/2021, at which time she will be running out of her hydrocodone/APAP.

## 2020-12-23 NOTE — Telephone Encounter (Signed)
Dr Dossie Arbour, can you please order this patients medication since Dr Holley Raring is out.  She ran out today and has an appointment on 01-04-2021.  Thank you.

## 2021-01-04 ENCOUNTER — Ambulatory Visit
Payer: Medicare Other | Attending: Student in an Organized Health Care Education/Training Program | Admitting: Student in an Organized Health Care Education/Training Program

## 2021-01-04 ENCOUNTER — Other Ambulatory Visit: Payer: Self-pay

## 2021-01-04 ENCOUNTER — Encounter: Payer: Self-pay | Admitting: Student in an Organized Health Care Education/Training Program

## 2021-01-04 VITALS — BP 147/94 | HR 66 | Temp 96.6°F | Resp 16 | Ht 65.0 in | Wt 179.0 lb

## 2021-01-04 DIAGNOSIS — M5412 Radiculopathy, cervical region: Secondary | ICD-10-CM | POA: Diagnosis present

## 2021-01-04 DIAGNOSIS — M5481 Occipital neuralgia: Secondary | ICD-10-CM

## 2021-01-04 DIAGNOSIS — M4692 Unspecified inflammatory spondylopathy, cervical region: Secondary | ICD-10-CM

## 2021-01-04 DIAGNOSIS — G894 Chronic pain syndrome: Secondary | ICD-10-CM | POA: Insufficient documentation

## 2021-01-04 DIAGNOSIS — Z981 Arthrodesis status: Secondary | ICD-10-CM

## 2021-01-04 DIAGNOSIS — M5416 Radiculopathy, lumbar region: Secondary | ICD-10-CM

## 2021-01-04 DIAGNOSIS — M47812 Spondylosis without myelopathy or radiculopathy, cervical region: Secondary | ICD-10-CM | POA: Diagnosis present

## 2021-01-04 DIAGNOSIS — G8929 Other chronic pain: Secondary | ICD-10-CM | POA: Diagnosis present

## 2021-01-04 MED ORDER — HYDROCODONE-ACETAMINOPHEN 10-325 MG PO TABS: 1.0000 | ORAL_TABLET | Freq: Two times a day (BID) | ORAL | 0 refills | Status: DC | PRN

## 2021-01-04 MED ORDER — TIZANIDINE HCL 4 MG PO TABS: 4.0000 mg | ORAL_TABLET | Freq: Three times a day (TID) | ORAL | 2 refills | Status: DC | PRN

## 2021-01-04 NOTE — Progress Notes (Signed)
Nursing Pain Medication Assessment:  Safety precautions to be maintained throughout the outpatient stay will include: orient to surroundings, keep bed in low position, maintain call bell within reach at all times, provide assistance with transfer out of bed and ambulation.  Medication Inspection Compliance: Pill count conducted under aseptic conditions, in front of the patient. Neither the pills nor the bottle was removed from the patient's sight at any time. Once count was completed pills were immediately returned to the patient in their original bottle.  Medication: Hydrocodone/APAP Pill/Patch Count:  37 of 60 pills remain Pill/Patch Appearance: Markings consistent with prescribed medication Bottle Appearance: Standard pharmacy container. Clearly labeled. Filled Date: 06 / 02 / 2022 Last Medication intake:  Yesterday

## 2021-01-04 NOTE — Progress Notes (Signed)
PROVIDER NOTE: Information contained herein reflects review and annotations entered in association with encounter. Interpretation of such information and data should be left to medically-trained personnel. Information provided to patient can be located elsewhere in the medical record under "Patient Instructions". Document created using STT-dictation technology, any transcriptional errors that may result from process are unintentional.    Patient: Tammy Lamb  Service Category: E/M  Provider: Gillis Santa, MD  DOB: [birth year]  DOS: 01/04/2021  Specialty: Interventional Pain Management  MRN: [id number]  Setting: Ambulatory outpatient  PCP: Gladstone Lighter, MD  Type: Established Patient    Referring Provider: Gladstone Lighter, MD  Location: Office  Delivery: Face-to-face     HPI  Tammy Lamb, a 70 y.o. year Lamb female, is here today because of her Chronic pain syndrome [G89.4]. Tammy Lamb primary complain today is Back Pain (low) and Neck Pain Last encounter: My last encounter with her was on 12/23/2020. 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 Pain Assessment: Severity of Chronic pain is reported as a 7 /10. Location: Neck  /radiates up into head. Onset: More than a month ago. Quality: Aching, Sharp, Stabbing. Timing: Constant. Modifying factor(s): meds help some. Vitals:  height is _0  (1.651 m) and weight is 179 lb (81.2 kg). Her temperature is 96.6 F (35.9 C) (abnormal). Her blood  pressure is 147/94 (abnormal) and her pulse is 66. Her respiration is 16 and oxygen saturation is 100%.   Reason for encounter:   Ms Lamb  follows up today for medication management.   She has been evaluated by neurology for cervicalgia.  Cervical spinal cord stimulator trial has not been recommended.  She did not bring a TENS unit today which I helped instruct her how to use.  I helped her apply to her neck region and also helped her with programming.  No side effects with hydrocodone such as constipation or nausea.  We will refill as below.  I will also refill her tizanidine.  Pharmacotherapy Assessment  Analgesic: Hydrocodone 6m BID prn 60/month     Monitoring: [place] PMP: PDMP reviewed during this encounter.       Pharmacotherapy: No side-effects or adverse reactions reported. Compliance: No problems identified. Effectiveness: Clinically acceptable.  TDewayne Shorter RN  01/04/2021 11:01 AM  Sign when Signing Visit Nursing Pain Medication Assessment:  Safety precautions to be maintained throughout the outpatient stay will include: orient to surroundings, keep bed in low position, maintain call bell within reach at all times, provide assistance with transfer out of bed and ambulation.  Medication Inspection Compliance: Pill count conducted under aseptic conditions, in front of the patient. Neither the pills nor the bottle was removed from the patient's sight at any time. Once count was completed pills were immediately returned to the patient in their original bottle.  Medication: Hydrocodone/APAP Pill/Patch Count:  37 of 60 pills remain Pill/Patch Appearance: Markings consistent with prescribed medication Bottle Appearance: Standard pharmacy container. Clearly labeled. Filled Date: 06 / 02 / 2022 Last Medication intake:  Yesterday   UDS:  Summary  Date Value Ref Range Status  09/16/2020 Note  Final    Comment:     ====================================================================  [employer] Select 13 (MW) ==================================================================== Test                             Result       Flag       Units  Drug Present and Declared for Prescription Verification   Hydrocodone                    378          EXPECTED   ng/mg creat   Norhydrocodone                 1419         EXPECTED   ng/mg creat    Sources of hydrocodone include scheduled prescription medications.    Norhydrocodone is an expected metabolite of hydrocodone.  ==================================================================== Test                      Result    Flag   Units      Ref Range   Creatinine              36               mg/dL      >=20 ==================================================================== Declared Medications:  The flagging and interpretation on this report are based on the  following declared medications.  Unexpected results may arise from  inaccuracies in the declared medications.   **Note: The testing scope of this panel includes these medications:   Hydrocodone   **Note: The testing scope of this panel does not include the  following reported medications:   Acetaminophen  Albuterol  Amlodipine  Atorvastatin  Cephalexin  Cinnamon  Citalopram  Gabapentin  Hydrochlorothiazide  Hydroxyzine  Magnesium (Mag-Ox)  Metformin  Omeprazole  Pentosan ([place])  Ramipril  Sodium Bicarbonate  Sulfasalazine  Tizanidine  Vitamin B12  Vitamin C  Vitamin D3 ==================================================================== For clinical consultation, please call [phone]. ====================================================================      ROS  Constitutional: Denies any fever or chills Gastrointestinal: No reported hemesis, hematochezia, vomiting, or acute GI distress Musculoskeletal:  Cervicalgia Neurological: No reported episodes of acute onset  apraxia, aphasia, dysarthria, agnosia, amnesia, paralysis, loss of coordination, or loss of consciousness  Medication Review  Cinnamon, HYDROcodone-acetaminophen, Omeprazole-Sodium Bicarbonate, Vitamin B 12, Vitamin D3, acetaminophen, albuterol, amLODipine, atorvastatin, cephALEXin, gabapentin, hydrOXYzine, hydrochlorothiazide, magnesium oxide, metFORMIN, oxybutynin, pentosan polysulfate, ramipril, sulfaSALAzine, and tiZANidine  History Review  Allergy: Tammy Lamb is allergic to aspirin and sulfa antibiotics. Drug: Tammy Lamb  reports no history of drug use. Alcohol:  reports current alcohol use. Tobacco:  reports that she has been smoking cigarettes. She has a 24.50 pack-year smoking history. She has never used smokeless tobacco. Social: Tammy Lamb  reports that she has been smoking cigarettes. She has a 24.50 pack-year smoking history. She has never used smokeless tobacco. She reports current alcohol use. She reports that she does not use drugs. Medical:  has a past medical history of Anxiety, Arthritis, Back pain, Colitis, ulcerative ([place]), COPD (chronic obstructive pulmonary disease) ([place]), Depression, Diabetes mellitus without complication ([place]), GERD (gastroesophageal reflux disease), H/O bladder infections, Headache, Hypertension, Motion sickness, Parkinson's disease ([place]), Stroke ([place]) (2015), TIA (transient ischemic attack) (2015), Urinary frequency, and Wears dentures. Surgical: Tammy Lamb  has a past surgical history that includes Tonsillectomy; Cholecystectomy; Abdominal hysterectomy; Diagnostic laparoscopy; Bladder suspension; Colonoscopy; Upper gi endoscopy; Knee arthroscopy (Left, 02/13/2014); Ankle arthroscopy (Left, 03/22/2016); Tendon repair (Left,  03/22/2016); Lumbar laminectomy/decompression microdiscectomy (N/A, 05/30/2017); and Anterior cervical decomp/discectomy fusion (N/A, 04/09/2019). Family: family history includes COPD in her father; Diabetes in her mother; Heart disease in her  father; Hypertension in her father and mother.  Laboratory Chemistry Profile   Renal Lab Results  Component Value Date   BUN 10 04/02/2019   CREATININE 0.80 10/10/2019   GFRAA >60 04/02/2019   GFRNONAA >60 04/02/2019     Hepatic No results found for: AST, ALT, ALBUMIN, ALKPHOS, HCVAB, AMYLASE, LIPASE, AMMONIA   Electrolytes Lab Results  Component Value Date   NA 134 (L) 04/02/2019   K 3.9 04/02/2019   CL 93 (L) 04/02/2019   CALCIUM 9.2 04/02/2019     Bone No results found for: VD25OH, VD125OH2TOT, FH5456YB6, LS9373SK8, 25OHVITD1, 25OHVITD2, 25OHVITD3, TESTOFREE, TESTOSTERONE   Inflammation (CRP: Acute Phase) (ESR: Chronic Phase) No results found for: CRP, ESRSEDRATE, LATICACIDVEN     Note: Above Lab results reviewed.  Physical Exam  General appearance: Well nourished, well developed, and well hydrated. In no apparent acute distress Mental status: Alert, oriented x 3 (person, place, & time)       Respiratory: No evidence of acute respiratory distress Eyes: PERLA Vitals: BP (!) 147/94   Pulse 66   Temp (!) 96.6 F (35.9 C)   Resp 16   Ht _0  (1.651 m)   Wt 179 lb (81.2 kg)   SpO2 100%   BMI 29.79 kg/m  BMI: Estimated body mass index is 29.79 kg/m as calculated from the following:   Height as of this encounter: _1  (1.651 m).   Weight as of this encounter: 179 lb (81.2 kg). Ideal: Ideal body weight: 57 kg (125 lb 10.6 oz) Adjusted ideal body weight: 66.7 kg (147 lb)    Cervical Spine Area Exam  Skin & Axial Inspection: Well healed scar from previous spine surgery detected Alignment: Symmetrical Functional ROM: Pain restricted ROM      Stability: No instability detected Muscle Tone/Strength: Functionally intact. No obvious neuro-muscular anomalies detected. Sensory (Neurological): Neurogenic pain pattern   Lumbar Spine Area Exam  Skin & Axial Inspection: Well healed scar from previous spine surgery detected Alignment: Scoliosis detected Functional ROM:  Restricted ROM       Stability: No instability detected Muscle Tone/Strength: Functionally intact. No obvious neuro-muscular anomalies detected. Sensory (Neurological): Neurogenic pain pattern Palpation: No palpable anomalies       Provocative Tests: Hyperextension/rotation test: (+) bilaterally for facet joint pain. Lumbar quadrant test (Kemp's test): deferred today       Lateral bending test: deferred today       Patrick's Maneuver: (+) for bilateral S-I arthralgia             FABER* test: (+) for bilateral S-I arthralgia             S-I anterior distraction/compression test: (+)   S-I arthralgia/arthropathy S-I lateral compression test: (+)   S-I arthralgia/arthropathy S-I Thigh-thrust test: deferred today         S-I Gaenslen's test: deferred today         *(Flexion, ABduction and External Rotation)  Gait & Posture Assessment  Ambulation: Limited Gait: Antalgic gait (limping) Posture: Difficulty standing up straight, due to pain   Lower Extremity Exam      Side: Right lower extremity   Side: Left lower extremity  Stability: No instability observed           Stability: No instability observed  Skin & Extremity Inspection: Skin color, temperature, and hair growth are WNL. No peripheral edema or cyanosis. No masses, redness, swelling, asymmetry, or associated skin lesions. No contractures.   Skin & Extremity Inspection: Skin color, temperature, and hair growth are WNL. No peripheral edema or cyanosis. No masses, redness, swelling, asymmetry, or associated skin lesions. No contractures.  Functional ROM: Pain restricted ROM for hip and knee joints           Functional ROM: Pain restricted ROM for hip and knee joints          Muscle Tone/Strength: Functionally intact. No obvious neuro-muscular anomalies detected.   Muscle Tone/Strength: Functionally intact. No obvious neuro-muscular anomalies detected.  Sensory (Neurological): Musculoskeletal pain pattern         Sensory  (Neurological): Musculoskeletal pain pattern        DTR: Patellar: deferred today Achilles: deferred today Plantar: deferred today   DTR: Patellar: deferred today Achilles: deferred today Plantar: deferred today  Palpation: No palpable anomalies   Palpation: No palpable anomalies     Assessment    Diagnosis   1. Chronic pain syndrome   2. Chronic radicular cervical pain   3. Lumbar radiculopathy   4. Bilateral occipital neuralgia   5. Cervical facet joint syndrome   6. Inflammatory spondylopathy of cervical region ([place])   7. S/P cervical spinal fusion (C3-C4 ACDF (03/2019)       Plan of Care   Tammy Lamb has a current medication list which includes the following long-term medication(s): amlodipine, atorvastatin, gabapentin, hydrochlorothiazide, metformin, ramipril, sulfasalazine, [START ON 01/22/2021] hydrocodone-acetaminophen, [START ON 02/21/2021] hydrocodone-acetaminophen, [START ON 03/23/2021] hydrocodone-acetaminophen, and tizanidine.  Pharmacotherapy (Medications Ordered): Meds ordered this encounter  Medications   HYDROcodone-acetaminophen (NORCO) 10-325 MG tablet    Sig: Take 1 tablet by mouth 2 (two) times daily as needed for severe pain. Must last 30 days.    Dispense:  60 tablet    Refill:  0    Not a duplicate. Do NOT delete! Dispense 1 day early if closed on refill date. Avoid benzodiazepines within 8 hours of opioids. Do not send refill requests.   HYDROcodone-acetaminophen (NORCO) 10-325 MG tablet    Sig: Take 1 tablet by mouth 2 (two) times daily as needed for severe pain. Must last 30 days.    Dispense:  60 tablet    Refill:  0    Not a duplicate. Do NOT delete! Dispense 1 day early if closed on refill date. Avoid benzodiazepines within 8 hours of opioids. Do not send refill requests.   HYDROcodone-acetaminophen (NORCO) 10-325 MG tablet    Sig: Take 1 tablet by mouth 2 (two) times daily as needed for severe pain. Must last 30 days.    Dispense:  60 tablet     Refill:  0    Not a duplicate. Do NOT delete! Dispense 1 day early if closed on refill date. Avoid benzodiazepines within 8 hours of opioids. Do not send refill requests.   tiZANidine (ZANAFLEX) 4 MG tablet    Sig: Take 1 tablet (4 mg total) by mouth every 8 (eight) hours as needed.    Dispense:  90 tablet    Refill:  2    Fill one day early if pharmacy is closed on scheduled refill date. Generic permitted. Do not send renewal requests. Void any older duplicate prescription or refill(s) that may be on file.   Follow-up plan:   Return in about 3 months (around 04/14/2021) for Medication Management, in  person.    Recent Visits Date Type Provider Dept  10/20/20 Telemedicine Gillis Santa, MD [facility]  Showing recent visits within past 90 days and meeting all other requirements Today's Visits Date Type Provider Dept  01/04/21 Office Visit Gillis Santa, MD [facility]  Showing today's visits and meeting all other requirements Future Appointments No visits were found meeting these conditions. Showing future appointments within next 90 days and meeting all other requirements I discussed the assessment and treatment plan with the patient. The patient was provided an opportunity to ask questions and all were answered. The patient agreed with the plan and demonstrated an understanding of the instructions.  Patient advised to call back or seek an in-person evaluation if the symptoms or condition worsens.  Duration of encounter: 63mnutes.  Note by: BGillis Santa MD Date: 01/04/2021; Time: 11:24 AM

## 2021-02-16 ENCOUNTER — Other Ambulatory Visit
Admission: RE | Admit: 2021-02-16 | Discharge: 2021-02-16 | Disposition: A | Discharge status: Home / Self Care | Payer: Medicare Other | Source: Ambulatory Visit | Attending: Infectious Diseases | Admitting: Infectious Diseases

## 2021-02-16 DIAGNOSIS — R079 Chest pain, unspecified: Secondary | ICD-10-CM | POA: Diagnosis present

## 2021-02-16 LAB — TROPONIN I (HIGH SENSITIVITY): Troponin I (High Sensitivity): 5 ng/L (ref <18)

## 2021-02-21 DIAGNOSIS — Z8616 Personal history of COVID-19: Secondary | ICD-10-CM

## 2021-02-21 HISTORY — DX: Personal history of COVID-19: Z86.16

## 2021-03-03 ENCOUNTER — Other Ambulatory Visit: Payer: Self-pay | Admitting: Neurosurgery

## 2021-03-03 DIAGNOSIS — G8929 Other chronic pain: Secondary | ICD-10-CM

## 2021-03-03 DIAGNOSIS — M5124 Other intervertebral disc displacement, thoracic region: Secondary | ICD-10-CM

## 2021-03-08 DIAGNOSIS — I70219 Atherosclerosis of native arteries of extremities with intermittent claudication, unspecified extremity: Secondary | ICD-10-CM | POA: Insufficient documentation

## 2021-03-08 DIAGNOSIS — I10 Essential (primary) hypertension: Secondary | ICD-10-CM | POA: Diagnosis present

## 2021-03-08 DIAGNOSIS — E782 Mixed hyperlipidemia: Secondary | ICD-10-CM | POA: Insufficient documentation

## 2021-03-08 DIAGNOSIS — I6523 Occlusion and stenosis of bilateral carotid arteries: Secondary | ICD-10-CM | POA: Insufficient documentation

## 2021-03-12 ENCOUNTER — Other Ambulatory Visit: Payer: Self-pay | Admitting: Student in an Organized Health Care Education/Training Program

## 2021-03-12 DIAGNOSIS — G8929 Other chronic pain: Secondary | ICD-10-CM

## 2021-03-12 DIAGNOSIS — G894 Chronic pain syndrome: Secondary | ICD-10-CM

## 2021-03-12 DIAGNOSIS — M5416 Radiculopathy, lumbar region: Secondary | ICD-10-CM

## 2021-03-12 DIAGNOSIS — M5412 Radiculopathy, cervical region: Secondary | ICD-10-CM

## 2021-03-16 ENCOUNTER — Telehealth: Payer: Self-pay

## 2021-03-16 DIAGNOSIS — G894 Chronic pain syndrome: Secondary | ICD-10-CM

## 2021-03-16 DIAGNOSIS — G8929 Other chronic pain: Secondary | ICD-10-CM

## 2021-03-16 DIAGNOSIS — M5416 Radiculopathy, lumbar region: Secondary | ICD-10-CM

## 2021-03-16 MED ORDER — GABAPENTIN 300 MG PO CAPS: ORAL_CAPSULE | ORAL | 2 refills | Status: DC

## 2021-03-16 NOTE — Telephone Encounter (Signed)
Pt states she needs a refill on her Gabapentin

## 2021-03-16 NOTE — Telephone Encounter (Signed)
Gabapentin prescribed on 12-23-20, with 2 refills. Next appt 03/2021.

## 2021-03-17 NOTE — Telephone Encounter (Signed)
error 

## 2021-03-17 NOTE — Telephone Encounter (Signed)
Patient notified

## 2021-03-23 ENCOUNTER — Ambulatory Visit: Payer: Medicare Other

## 2021-03-23 ENCOUNTER — Ambulatory Visit: Admission: RE | Admit: 2021-03-23 | Payer: Medicare Other | Source: Ambulatory Visit

## 2021-03-24 ENCOUNTER — Telehealth: Payer: Self-pay | Admitting: Student in an Organized Health Care Education/Training Program

## 2021-03-24 NOTE — Telephone Encounter (Signed)
The patient is out of her medication.

## 2021-03-24 NOTE — Telephone Encounter (Signed)
Spoke with pharmacy.  They received 2 out of the 3 prescriptions that were sent in on 01-04-2021.  They did not receive the one that could have been filled on 03-23-2021.

## 2021-03-25 ENCOUNTER — Telehealth: Payer: Self-pay | Admitting: Student in an Organized Health Care Education/Training Program

## 2021-03-25 MED ORDER — HYDROCODONE-ACETAMINOPHEN 10-325 MG PO TABS: 1.0000 | ORAL_TABLET | Freq: Two times a day (BID) | ORAL | 0 refills | Status: DC | PRN

## 2021-03-25 NOTE — Addendum Note (Signed)
Addended by: Molli Barrows on: 03/25/2021 11:34 AM   Modules accepted: Orders

## 2021-03-25 NOTE — Telephone Encounter (Signed)
Notified patient that Dr Andree Elk would be sending in her script.

## 2021-04-04 ENCOUNTER — Other Ambulatory Visit: Payer: Self-pay

## 2021-04-04 ENCOUNTER — Ambulatory Visit
Admission: RE | Admit: 2021-04-04 | Discharge: 2021-04-04 | Disposition: A | Discharge status: Home / Self Care | Payer: Medicare Other | Source: Ambulatory Visit | Attending: Neurosurgery | Admitting: Neurosurgery

## 2021-04-04 DIAGNOSIS — G8929 Other chronic pain: Secondary | ICD-10-CM | POA: Diagnosis present

## 2021-04-04 DIAGNOSIS — M5124 Other intervertebral disc displacement, thoracic region: Secondary | ICD-10-CM

## 2021-04-04 DIAGNOSIS — M5442 Lumbago with sciatica, left side: Secondary | ICD-10-CM | POA: Diagnosis present

## 2021-04-12 ENCOUNTER — Encounter: Payer: Self-pay | Admitting: Student in an Organized Health Care Education/Training Program

## 2021-04-12 ENCOUNTER — Ambulatory Visit
Payer: Medicare Other | Attending: Student in an Organized Health Care Education/Training Program | Admitting: Student in an Organized Health Care Education/Training Program

## 2021-04-12 ENCOUNTER — Other Ambulatory Visit: Payer: Self-pay

## 2021-04-12 VITALS — BP 142/101 | HR 80 | Temp 97.0°F | Resp 16 | Ht 66.0 in | Wt 176.0 lb

## 2021-04-12 DIAGNOSIS — M5412 Radiculopathy, cervical region: Secondary | ICD-10-CM | POA: Insufficient documentation

## 2021-04-12 DIAGNOSIS — G894 Chronic pain syndrome: Secondary | ICD-10-CM | POA: Insufficient documentation

## 2021-04-12 DIAGNOSIS — M48062 Spinal stenosis, lumbar region with neurogenic claudication: Secondary | ICD-10-CM | POA: Diagnosis present

## 2021-04-12 DIAGNOSIS — M5416 Radiculopathy, lumbar region: Secondary | ICD-10-CM | POA: Diagnosis not present

## 2021-04-12 DIAGNOSIS — G8929 Other chronic pain: Secondary | ICD-10-CM | POA: Diagnosis present

## 2021-04-12 DIAGNOSIS — M4692 Unspecified inflammatory spondylopathy, cervical region: Secondary | ICD-10-CM | POA: Insufficient documentation

## 2021-04-12 DIAGNOSIS — M542 Cervicalgia: Secondary | ICD-10-CM | POA: Insufficient documentation

## 2021-04-12 DIAGNOSIS — M5481 Occipital neuralgia: Secondary | ICD-10-CM | POA: Diagnosis present

## 2021-04-12 DIAGNOSIS — Z981 Arthrodesis status: Secondary | ICD-10-CM | POA: Diagnosis present

## 2021-04-12 MED ORDER — GABAPENTIN 300 MG PO CAPS: ORAL_CAPSULE | ORAL | 5 refills | Status: DC

## 2021-04-12 MED ORDER — HYDROCODONE-ACETAMINOPHEN 10-325 MG PO TABS: 1.0000 | ORAL_TABLET | Freq: Two times a day (BID) | ORAL | 0 refills | Status: DC | PRN

## 2021-04-12 NOTE — Progress Notes (Signed)
PROVIDER NOTE: Information contained herein reflects review and annotations entered in association with encounter. Interpretation of such information and data should be left to medically-trained personnel. Information provided to patient can be located elsewhere in the medical record under "Patient Instructions". Document created using STT-dictation technology, any transcriptional errors that may result from process are unintentional.    Patient: Tammy Lamb  Service Category: E/M  Provider: Gillis Santa, MD  DOB: Nov 09, 1950  DOS: 04/12/2021  Specialty: Interventional Pain Management  MRN: [id number]  Setting: Ambulatory outpatient  PCP: Gladstone Lighter, MD  Type: Established Patient    Referring Provider: Gladstone Lighter, MD  Location: Office  Delivery: Face-to-face     HPI  Tammy Lamb, a 70 y.o. 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 Pain Assessment: Severity of Chronic pain is reported as a 9 /10. Location: Neck Upper/radiates up into head and down into neck. Onset: More than a month ago. Quality: Aching, Sharp. Timing: Constant. Modifying factor(s):  Marland Kitchen Vitals:  height is _0  (1.676 m) and weight is 176 lb (79.8 kg). Her temperature is 97 F (36.1 C)  (abnormal). Her blood pressure is 142/101 (abnormal) and her pulse is 80. Her respiration is 16 and oxygen saturation is 97%.   Reason for encounter: medication management.  Tammy Lamb presents today for medication management.  She is endorsing increased neck and occipital pain.  She states that she had a echocardiogram and stress test done yesterday.  She states that this took a lot out of her.  She is having acute on chronic pain in her neck and in her mid back.  She has a nerve conduction velocity/EMG study coming up.  She continues hydrocodone as prescribed which does help manage her pain somewhat.  She continues gabapentin 600 mg during the day, 900 mg nightly.  I recommended that she increase her gabapentin to 900 mg twice daily for the next 1 to 2 weeks given increased cervicalgia.  Pharmacotherapy Assessment  Analgesic: Hydrocodone 82m BID prn 60/month     Monitoring: [place] PMP: PDMP reviewed during this encounter.       Pharmacotherapy: No side-effects or adverse reactions reported. Compliance: No problems identified. Effectiveness: Clinically acceptable.  TDewayne Shorter RN  04/12/2021 10:49 AM  Sign when Signing Visit Nursing Pain Medication Assessment:  Safety precautions to be maintained throughout the outpatient stay Lamb include: orient to surroundings, keep bed in low position, maintain call bell within reach at all times, provide assistance with transfer out of bed and ambulation.  Medication Inspection Compliance: Pill count conducted under aseptic conditions, in front of the patient. Neither the pills nor the bottle was removed from the patient's sight at any time. Once count was completed pills were immediately returned to the patient in their original bottle.  Medication: Hydrocodone/APAP Pill/Patch Count:  30 of 60 pills remain Pill/Patch Appearance: Markings consistent with prescribed medication Bottle Appearance: Standard pharmacy container. Clearly labeled. Filled  Date: 09  / 02 / 2022 Last Medication intake:  Today      UDS:  Summary  Date Value Ref Range Status  09/16/2020 Note  Final    Comment:    ==================================================================== [employer] [facility] [employer] (MW) ==================================================================== Test                             Result       Flag       Units  Drug Present and Declared for Prescription Verification   Hydrocodone                    378          EXPECTED   ng/mg creat   Norhydrocodone                 1419         EXPECTED   ng/mg creat    Sources of hydrocodone include scheduled prescription medications.    Norhydrocodone is an expected metabolite of hydrocodone.  ==================================================================== Test                      Result    Flag   Units      Ref Range   Creatinine              36               mg/dL      >=20 ==================================================================== Declared Medications:  The flagging and interpretation on this report are based on the  following declared medications.  Unexpected results may arise from  inaccuracies in the declared medications.   **Note: The testing scope of this panel includes these medications:   Hydrocodone   **Note: The testing scope of this panel does not include the  following reported medications:   Acetaminophen  Albuterol  Amlodipine  Atorvastatin  Cephalexin  Cinnamon  Citalopram  Gabapentin  Hydrochlorothiazide  Hydroxyzine  Magnesium (Mag-Ox)  Metformin  Omeprazole  Pentosan ([place])  Ramipril  Sodium Bicarbonate  Sulfasalazine  Tizanidine  Vitamin B12  Vitamin C  Vitamin D3 ==================================================================== For clinical consultation, please call [phone]. ====================================================================      ROS  Constitutional: Denies any fever or chills Gastrointestinal: No  reported hemesis, hematochezia, vomiting, or acute GI distress Musculoskeletal:  Cervicalgia Neurological: No reported episodes of acute onset apraxia, aphasia, dysarthria, agnosia, amnesia, paralysis, loss of coordination, or loss of consciousness  Medication Review  Cinnamon, HYDROcodone-acetaminophen, Omeprazole-Sodium Bicarbonate, Vitamin B 12, Vitamin D3, acetaminophen, albuterol, amLODipine, ascorbic acid, atorvastatin, cephALEXin, citalopram, gabapentin, hydrOXYzine, hydrochlorothiazide, magnesium oxide, metFORMIN, oxybutynin, pentosan polysulfate, ramipril, sulfaSALAzine, and tiZANidine  History Review  Allergy: Tammy Lamb is allergic to aspirin and sulfa antibiotics. Drug: Tammy Lamb  reports no history of drug use. Alcohol:  reports current alcohol use. Tobacco:  reports that she has been smoking cigarettes. She has a 24.50 pack-year smoking history. She has never used smokeless tobacco. Social: Tammy Lamb  reports that she has been smoking cigarettes. She has a 24.50 pack-year smoking history. She has never used smokeless tobacco. She reports current alcohol use. She reports that she does not use drugs. Medical:  has a past medical history of Anxiety, Arthritis, Back pain, Colitis, ulcerative ([place]), COPD (chronic obstructive pulmonary disease) ([place]), Depression, Diabetes mellitus without complication ([place]), GERD (gastroesophageal reflux disease), H/O bladder infections, Headache, Hypertension, Motion sickness, Parkinson's disease ([place]), Stroke ([place]) (2015), TIA (transient  ischemic attack) (2015), Urinary frequency, and Wears dentures. Surgical: Tammy Lamb  has a past surgical history that includes Tonsillectomy; Cholecystectomy; Abdominal hysterectomy; Diagnostic laparoscopy; Bladder suspension; Colonoscopy; Upper gi endoscopy; Knee arthroscopy (Left, 02/13/2014); Ankle arthroscopy (Left, 03/22/2016); Tendon repair (Left, 03/22/2016); Lumbar laminectomy/decompression microdiscectomy (N/A,  05/30/2017); and Anterior cervical decomp/discectomy fusion (N/A, 04/09/2019). Family: family history includes COPD in her father; Diabetes in her mother; Heart disease in her father; Hypertension in her father and mother.  Laboratory Chemistry Profile   Renal Lab Results  Component Value Date   BUN 10 04/02/2019   CREATININE 0.80 10/10/2019   GFRAA >60 04/02/2019   GFRNONAA >60 04/02/2019    Hepatic No results found for: AST, ALT, ALBUMIN, ALKPHOS, HCVAB, AMYLASE, LIPASE, AMMONIA  Electrolytes Lab Results  Component Value Date   NA 134 (L) 04/02/2019   K 3.9 04/02/2019   CL 93 (L) 04/02/2019   CALCIUM 9.2 04/02/2019    Bone No results found for: VD25OH, VD125OH2TOT, OI7867EH2, [id number], 25OHVITD1, 25OHVITD2, 25OHVITD3, TESTOFREE, TESTOSTERONE  Inflammation (CRP: Acute Phase) (ESR: Chronic Phase) No results found for: CRP, ESRSEDRATE, LATICACIDVEN       Note: Above Lab results reviewed.  Recent Imaging Review  MR LUMBAR SPINE WO CONTRAST CLINICAL DATA:  Thoracic and lumbar region back pain which is chronic. Bilateral leg numbness.  EXAM: MRI LUMBAR SPINE WITHOUT CONTRAST  TECHNIQUE: Multiplanar, multisequence MR imaging of the lumbar spine was performed. No intravenous contrast was administered.  COMPARISON:  08/29/2017  FINDINGS: Segmentation: 5 lumbar type vertebral bodies as numbered previously.  Alignment:  No malalignment.  Vertebrae:  No fracture or focal bone lesion.  Conus medullaris and cauda equina: Conus extends to the L1 level. Conus and cauda equina appear normal.  Paraspinal and other soft tissues: Negative  Disc levels:  L1-2: Bulging of the disc slightly more prominent towards the right. Previous left hemilaminectomy. Mild narrowing of the right lateral recess but no likely neural compression.  L2-3: Mild bulging of the disc. Previous left hemilaminectomy. No compressive stenosis.  L3-4: Previous left hemilaminectomy. Shallow disc  protrusion more prominent towards the right. Stenosis of the right lateral recess that could cause right-sided neural compression.  L4-5: Bulging of the disc. Bilateral facet and ligamentous hypertrophy. Mild stenosis of both lateral recesses but without definite neural compression.  L5-S1: Endplate osteophytes and bulging of the disc. Facet and ligamentous hypertrophy. Mild narrowing of the subarticular lateral recesses. Foraminal narrowing left more than right. Some potential that this could affect the exiting left L5 nerve.  IMPRESSION: L1-2, L2-3 and L3-4: Previous left hemilaminectomy ease. Sufficient patency of the central canal. Mild narrowing of the right lateral recess at L1-2 and moderate narrowing of the right lateral recess at L3-4.  L4-5: Mild stenosis of the lateral recesses but without definite neural compression or significant change.  L5-S1: Chronic disc degeneration and facet degeneration mild narrowing of the subarticular lateral recesses and foramina left more than right. No significant change.  Electronically Signed   By: Nelson Chimes M.D.   On: 04/04/2021 14:21 MR THORACIC SPINE WO CONTRAST CLINICAL DATA:  Chronic thoracic region back pain with bilateral leg numbness.  EXAM: MRI THORACIC SPINE WITHOUT CONTRAST  TECHNIQUE: Multiplanar, multisequence MR imaging of the thoracic spine was performed. No intravenous contrast was administered.  COMPARISON:  12/02/2019  FINDINGS: Alignment:  Exaggerated thoracic region kyphosis as seen previously.  Vertebrae: No acute fracture or focal bone lesion. Discogenic endplate marrow changes from T4-5 through T8-9, with mild edema at the  T8-9 level.  Cord:  No primary cord lesion.  See below regarding stenosis.  Paraspinal and other soft tissues: Large hiatal hernia.  Disc levels:  No significant finding from T1-2 through T4-5.  T5-6: Small central disc herniation effaces the ventral subarachnoid space  and indents the ventral cord slightly. Ample subarachnoid space present dorsal to the cord.  T6-7: Central to right-sided disc herniation and osteophyte effaces the ventral subarachnoid space and indents the right side of the cord slightly. Ample subarachnoid space present dorsal to the cord.  T7-8: Central disc herniation effaces the ventral subarachnoid space and indents the ventral cord slightly. Ample subarachnoid space present dorsal to the cord.  T8-9: Central disc herniation effaces the ventral subarachnoid space and indents the ventral cord slightly. Ample subarachnoid space present dorsal to the cord.  T9-10: Mild bulging of the disc.  No compressive stenosis.  T10-11: Mild bulging of the disc. Facet osteoarthritis. No compressive stenosis.  T11-12: Minimal disc bulge and mild facet osteoarthritis. No stenosis.  T12-L1: Minimal disc bulge.  No stenosis.  Compared to the study of 12/02/2019, the disc pathology and stenosis appears quite similar.  IMPRESSION: No significant change since 12/02/2019. Exaggerated kyphotic curvature of the spine. Chronic disc herniations from T5-6 through T8-9 with effacement of the ventral subarachnoid space and indentation of the ventral cord as outlined above. There is ample subarachnoid space present dorsal to the cord at these levels.  Large hiatal hernia.  Chronic endplate marrow changes in the midthoracic region, with mild edema at the T8-9 level, which could relate to back pain.  Electronically Signed   By: Nelson Chimes M.D.   On: 04/04/2021 14:15 Note: Reviewed        Physical Exam  General appearance: Well nourished, well developed, and well hydrated. In no apparent acute distress Mental status: Alert, oriented x 3 (person, place, & time)       Respiratory: No evidence of acute respiratory distress Eyes: PERLA Vitals: BP (!) 142/101 Comment: Dr Holley Raring notified  Pulse 80   Temp (!) 97 F (36.1 C)   Resp 16   Ht _0   (1.676 m)   Wt 176 lb (79.8 kg)   SpO2 97%   BMI 28.41 kg/m  BMI: Estimated body mass index is 28.41 kg/m as calculated from the following:   Height as of this encounter: _1  (1.676 m).   Weight as of this encounter: 176 lb (79.8 kg). Ideal: Ideal body weight: 59.3 kg (130 lb 11.7 oz) Adjusted ideal body weight: 67.5 kg (148 lb 13.4 oz)  Cervical Spine Area Exam  Skin & Axial Inspection: Well healed scar from previous spine surgery detected Alignment: Symmetrical Functional ROM: Pain restricted ROM      Stability: No instability detected Muscle Tone/Strength: Functionally intact. No obvious neuro-muscular anomalies detected. Sensory (Neurological): Neurogenic pain pattern, arthropathic pain pattern Tender to palpation  Assessment   Status Diagnosis  Persistent Persistent Persistent 1. Inflammatory spondylopathy of cervical region ([place])   2. Chronic pain syndrome   3. Chronic radicular cervical pain   4. Lumbar radiculopathy   5. Bilateral occipital neuralgia   6. S/P cervical spinal fusion (C3-C4 ACDF (03/2019)   7. Spinal stenosis, lumbar region, with neurogenic claudication   8. Cervicalgia       Plan of Care   Tammy Lamb has a current medication list which includes the following long-term medication(s): amlodipine, atorvastatin, hydrochlorothiazide, ramipril, sulfasalazine, tizanidine, gabapentin, [START ON 04/23/2021] hydrocodone-acetaminophen, [START ON 05/23/2021] hydrocodone-acetaminophen, [  START ON 06/22/2021] hydrocodone-acetaminophen, and metformin.  Pharmacotherapy (Medications Ordered): Meds ordered this encounter  Medications   HYDROcodone-acetaminophen (NORCO) 10-325 MG tablet    Sig: Take 1 tablet by mouth 2 (two) times daily as needed for severe pain. Must last 30 days.    Dispense:  60 tablet    Refill:  0    Not a duplicate. Do NOT delete! Dispense 1 day early if closed on refill date. Avoid benzodiazepines within 8 hours of opioids. Do not  send refill requests.   HYDROcodone-acetaminophen (NORCO) 10-325 MG tablet    Sig: Take 1 tablet by mouth 2 (two) times daily as needed for severe pain. Must last 30 days.    Dispense:  60 tablet    Refill:  0    Not a duplicate. Do NOT delete! Dispense 1 day early if closed on refill date. Avoid benzodiazepines within 8 hours of opioids. Do not send refill requests.   HYDROcodone-acetaminophen (NORCO) 10-325 MG tablet    Sig: Take 1 tablet by mouth 2 (two) times daily as needed for severe pain. Must last 30 days.    Dispense:  60 tablet    Refill:  0    Not a duplicate. Do NOT delete! Dispense 1 day early if closed on refill date. Avoid benzodiazepines within 8 hours of opioids. Do not send refill requests.   gabapentin (NEURONTIN) 300 MG capsule    Sig: Take 2 capsules (600 mg total) by mouth daily AND 3 capsules (900 mg total) at bedtime.    Dispense:  150 capsule    Refill:  5    Do not place this medication, or any other prescription from our practice, on "Automatic Refill". Patient may have prescription filled one day early if pharmacy is closed on scheduled refill date.   I have discussed spinal cord stimulation with the patient in the past but she wants to hold off on this.  Follow-up plan:   Return in about 3 months (around 07/12/2021) for Medication Management, in person.    Recent Visits No visits were found meeting these conditions. Showing recent visits within past 90 days and meeting all other requirements Today's Visits Date Type Provider Dept  04/12/21 Office Visit Gillis Santa, MD [facility]  Showing today's visits and meeting all other requirements Future Appointments No visits were found meeting these conditions. Showing future appointments within next 90 days and meeting all other requirements I discussed the assessment and treatment plan with the patient. The patient was provided an opportunity to ask questions and all were answered. The patient agreed  with the plan and demonstrated an understanding of the instructions.  Patient advised to call back or seek an in-person evaluation if the symptoms or condition worsens.  Duration of encounter: 38mnutes.  Note by: BGillis Santa MD Date: 04/12/2021; Time: 11:17 AM

## 2021-04-12 NOTE — Progress Notes (Signed)
Nursing Pain Medication Assessment:  Safety precautions to be maintained throughout the outpatient stay will include: orient to surroundings, keep bed in low position, maintain call bell within reach at all times, provide assistance with transfer out of bed and ambulation.  Medication Inspection Compliance: Pill count conducted under aseptic conditions, in front of the patient. Neither the pills nor the bottle was removed from the patient's sight at any time. Once count was completed pills were immediately returned to the patient in their original bottle.  Medication: Hydrocodone/APAP Pill/Patch Count:  30 of 60 pills remain Pill/Patch Appearance: Markings consistent with prescribed medication Bottle Appearance: Standard pharmacy container. Clearly labeled. Filled Date: 09 / 02 / 2022 Last Medication intake:  Today

## 2021-04-13 ENCOUNTER — Other Ambulatory Visit: Payer: Self-pay | Admitting: Internal Medicine

## 2021-04-13 DIAGNOSIS — Z1231 Encounter for screening mammogram for malignant neoplasm of breast: Secondary | ICD-10-CM

## 2021-04-16 ENCOUNTER — Other Ambulatory Visit: Payer: Self-pay | Admitting: Student in an Organized Health Care Education/Training Program

## 2021-04-16 DIAGNOSIS — G894 Chronic pain syndrome: Secondary | ICD-10-CM

## 2021-04-18 DIAGNOSIS — I272 Pulmonary hypertension, unspecified: Secondary | ICD-10-CM

## 2021-04-18 HISTORY — DX: Pulmonary hypertension, unspecified: I27.20

## 2021-05-11 ENCOUNTER — Telehealth: Payer: Self-pay

## 2021-05-11 MED ORDER — GABAPENTIN 600 MG PO TABS: 900.0000 mg | ORAL_TABLET | Freq: Two times a day (BID) | ORAL | 5 refills | Status: DC

## 2021-05-11 NOTE — Telephone Encounter (Signed)
Gabapentin 300 mg Rx was sent in September with 5 refills.  The problem is that the sig on the bottle states take 600 mg in the a.m. and 900 p.m. and the dosage has been increased to 900 a.m. and 900 p.m. This has made the prescription run out too soon and they will not let her fill again til Saturday and she is out of medication.  I told her I would let Dr Holley Raring know and see if he would send in another Rx.

## 2021-05-11 NOTE — Telephone Encounter (Signed)
Pt has run out of gabapentin. She said she was increased to 3 in the morning and 3 at night. However her Rx stil says 2 in the morning and 3 at night.  Pharm does not have new RX

## 2021-05-11 NOTE — Telephone Encounter (Signed)
Patient notified, provided instructions on how to take.

## 2021-05-12 ENCOUNTER — Other Ambulatory Visit: Payer: Self-pay

## 2021-05-12 ENCOUNTER — Ambulatory Visit
Admission: RE | Admit: 2021-05-12 | Discharge: 2021-05-12 | Disposition: A | Discharge status: Home / Self Care | Payer: Medicare Other | Source: Ambulatory Visit | Attending: Internal Medicine | Admitting: Internal Medicine

## 2021-05-12 DIAGNOSIS — Z1231 Encounter for screening mammogram for malignant neoplasm of breast: Secondary | ICD-10-CM | POA: Diagnosis present

## 2021-05-16 ENCOUNTER — Other Ambulatory Visit: Payer: Self-pay | Admitting: Neurosurgery

## 2021-05-23 ENCOUNTER — Other Ambulatory Visit: Payer: Self-pay

## 2021-05-23 ENCOUNTER — Encounter
Admission: RE | Admit: 2021-05-23 | Discharge: 2021-05-23 | Disposition: A | Discharge status: Home / Self Care | Payer: Medicare Other | Source: Ambulatory Visit | Attending: Neurosurgery | Admitting: Neurosurgery

## 2021-05-23 ENCOUNTER — Encounter: Payer: Self-pay | Admitting: Neurosurgery

## 2021-05-23 DIAGNOSIS — Z01818 Encounter for other preprocedural examination: Secondary | ICD-10-CM | POA: Diagnosis present

## 2021-05-23 HISTORY — DX: Hypo-osmolality and hyponatremia: E87.1

## 2021-05-23 HISTORY — DX: Anemia, unspecified: D64.9

## 2021-05-23 HISTORY — DX: Interstitial cystitis (chronic) without hematuria: N30.10

## 2021-05-23 HISTORY — DX: Personal history of other diseases of the digestive system: Z87.19

## 2021-05-23 LAB — BASIC METABOLIC PANEL WITH GFR
Anion gap: 9 (ref 5–15)
BUN: 9 mg/dL (ref 8–23)
CO2: 29 mmol/L (ref 22–32)
Calcium: 9 mg/dL (ref 8.9–10.3)
Chloride: 95 mmol/L — ABNORMAL LOW (ref 98–111)
Creatinine, Ser: 0.73 mg/dL (ref 0.44–1.00)
GFR, Estimated: >60 mL/min
Glucose, Bld: 72 mg/dL (ref 70–99)
Potassium: 3.9 mmol/L (ref 3.5–5.1)
Sodium: 133 mmol/L — ABNORMAL LOW (ref 135–145)

## 2021-05-23 LAB — TYPE AND SCREEN
ABO/RH(D): O POS
Antibody Screen: NEGATIVE

## 2021-05-23 LAB — CBC
HCT: 37.9 % (ref 36.0–46.0)
Hemoglobin: 12.5 g/dL (ref 12.0–15.0)
MCH: 29.1 pg (ref 26.0–34.0)
MCHC: 33 g/dL (ref 30.0–36.0)
MCV: 88.3 fL (ref 80.0–100.0)
Platelets: 434 10*3/uL — ABNORMAL HIGH (ref 150–400)
RBC: 4.29 MIL/uL (ref 3.87–5.11)
RDW: 13.8 % (ref 11.5–15.5)
WBC: 7.9 10*3/uL (ref 4.0–10.5)
nRBC: 0 % (ref 0.0–0.2)

## 2021-05-23 LAB — URINALYSIS, ROUTINE W REFLEX MICROSCOPIC
Bacteria, UA: NONE SEEN
Bilirubin Urine: NEGATIVE
Glucose, UA: NEGATIVE mg/dL
Hgb urine dipstick: NEGATIVE
Ketones, ur: NEGATIVE mg/dL
Nitrite: NEGATIVE
Protein, ur: NEGATIVE mg/dL
Specific Gravity, Urine: 1.01 (ref 1.005–1.030)
pH: 7 (ref 5.0–8.0)

## 2021-05-23 LAB — PROTIME-INR
INR: 1 (ref 0.8–1.2)
Prothrombin Time: 13.2 seconds (ref 11.4–15.2)

## 2021-05-23 LAB — SURGICAL PCR SCREEN
MRSA, PCR: NEGATIVE
Staphylococcus aureus: NEGATIVE

## 2021-05-23 LAB — APTT: aPTT: 34 seconds (ref 24–36)

## 2021-05-23 NOTE — Patient Instructions (Addendum)
Your procedure is scheduled on:05-27-21 Friday Report to the Registration Desk on the 1st floor of the [facility]. Then proceed to the 2nd floor Surgery Desk in the [facility] To find out your arrival time, please call [phone] between 1PM - 3PM on:05-26-21 Thursday  REMEMBER: Instructions that are not followed completely may result in serious medical risk, up to and including death; or upon the discretion of your surgeon and anesthesiologist your surgery may need to be rescheduled.  Do not eat food after midnight the night before surgery.  No gum chewing, lozengers or hard candies.  You may however, drink Water up to 2 hours before you are scheduled to arrive for your surgery. Do not drink anything within 2 hours of your scheduled arrival time.  Type 1 and Type 2 diabetics should only drink water.  TAKE THESE MEDICATIONS THE MORNING OF SURGERY WITH A SIP OF WATER: -citalopram (CELEXA) 20 MG tablet -gabapentin (NEURONTIN) 600 MG tablet -amLODipine (NORVASC) 5 MG tablet -HYDROcodone-acetaminophen (NORCO) 10-325 MG tablet -tiZANidine (ZANAFLEX) 4 MG tablet  Stop metFORMIN (GLUCOPHAGE) 500 MG tablet 2 days prior to surgery-Last dose on 05-24-21 Tuesday  Use your  VENTOLIN HFA 108 (90 Base) MCG/ACT inhaler the day of surgery and bring inhaler to the hospital  One week prior to surgery: Stop Anti-inflammatories (NSAIDS) such as Advil, Aleve, Ibuprofen, Motrin, Naproxen, Naprosyn and Aspirin based products such as Excedrin, Goodys Powder, BC Powder.You may however, continue to take Tylenol if needed for pain up until the day of surgery.  Stop ANY OVER THE COUNTER supplements/vitamins NOW (05-23-21) until after surgery.  No Alcohol for 24 hours before or after surgery.  No Smoking including e-cigarettes for 24 hours prior to surgery.  No chewable tobacco products for at least 6 hours prior to surgery.  No nicotine patches on the day of surgery.  Do not use any "recreational"  drugs for at least a week prior to your surgery.  Please be advised that the combination of cocaine and anesthesia may have negative outcomes, up to and including death. If you test positive for cocaine, your surgery will be cancelled.  On the morning of surgery brush your teeth with toothpaste and water, you may rinse your mouth with mouthwash if you wish. Do not swallow any toothpaste or mouthwash.  Use CHG Soap as directed on instruction sheet.  Do not wear jewelry, make-up, hairpins, clips or nail polish.  Do not wear lotions, powders, or perfumes.   Do not shave body from the neck down 48 hours prior to surgery just in case you cut yourself which could leave a site for infection.  Also, freshly shaved skin may become irritated if using the CHG soap.  Contact lenses, hearing aids and dentures may not be worn into surgery.  Do not bring valuables to the hospital. [facility] is not responsible for any missing/lost belongings or valuables.   Notify your doctor if there is any change in your medical condition (cold, fever, infection).  Wear comfortable clothing (specific to your surgery type) to the hospital.  After surgery, you can help prevent lung complications by doing breathing exercises.  Take deep breaths and cough every 1-2 hours. Your doctor may order a device called an Incentive Spirometer to help you take deep breaths. When coughing or sneezing, hold a pillow firmly against your incision with both hands. This is called "splinting." Doing this helps protect your incision. It also decreases belly discomfort.  If you are being admitted to the  hospital overnight, leave your suitcase in the car. After surgery it may be brought to your room.  If you are being discharged the day of surgery, you will not be allowed to drive home. You will need a responsible adult (18 years or older) to drive you home and stay with you that night.   If you are taking public transportation, you  will need to have a responsible adult (18 years or older) with you. Please confirm with your physician that it is acceptable to use public transportation.   Please call the [facility] Dept. at [phone] if you have any questions about these instructions.  Surgery Visitation Policy:  Patients undergoing a surgery or procedure may have one family member or support person with them as long as that person is not COVID-19 positive or experiencing its symptoms.  That person may remain in the waiting area during the procedure and may rotate out with other people.  Inpatient Visitation:    Visiting hours are 7 a.m. to 8 p.m. Up to two visitors ages 16+ are allowed at one time in a patient room. The visitors may rotate out with other people during the day. Visitors must check out when they leave, or other visitors will not be allowed. One designated support person may remain overnight. The visitor must pass COVID-19 screenings, use hand sanitizer when entering and exiting the patient's room and wear a mask at all times, including in the patient's room. Patients must also wear a mask when staff or their visitor are in the room. Masking is required regardless of vaccination status.

## 2021-05-25 NOTE — Pre-Procedure Instructions (Signed)
Called pt and instructed her that Dr Izora Ribas wants her to take her last dose of Pentosan on the am of 05-26-21 . Pt verbalized understanding

## 2021-05-27 ENCOUNTER — Observation Stay
Admission: RE | Admit: 2021-05-27 | Discharge: 2021-05-28 | Disposition: A | Discharge status: Home / Self Care | Payer: Medicare Other | Attending: Neurosurgery | Admitting: Neurosurgery

## 2021-05-27 ENCOUNTER — Ambulatory Visit: Payer: Medicare Other | Admitting: Certified Registered"

## 2021-05-27 ENCOUNTER — Ambulatory Visit: Payer: Medicare Other

## 2021-05-27 ENCOUNTER — Encounter
Admission: RE | Disposition: A | Discharge status: Home / Self Care | Payer: Self-pay | Source: Home / Self Care | Attending: Neurosurgery

## 2021-05-27 ENCOUNTER — Encounter: Payer: Self-pay | Admitting: Neurosurgery

## 2021-05-27 ENCOUNTER — Other Ambulatory Visit: Payer: Self-pay

## 2021-05-27 DIAGNOSIS — F17200 Nicotine dependence, unspecified, uncomplicated: Secondary | ICD-10-CM | POA: Diagnosis not present

## 2021-05-27 DIAGNOSIS — I10 Essential (primary) hypertension: Secondary | ICD-10-CM | POA: Diagnosis not present

## 2021-05-27 DIAGNOSIS — M5416 Radiculopathy, lumbar region: Secondary | ICD-10-CM | POA: Diagnosis present

## 2021-05-27 DIAGNOSIS — E119 Type 2 diabetes mellitus without complications: Secondary | ICD-10-CM | POA: Insufficient documentation

## 2021-05-27 DIAGNOSIS — J449 Chronic obstructive pulmonary disease, unspecified: Secondary | ICD-10-CM | POA: Insufficient documentation

## 2021-05-27 DIAGNOSIS — M48061 Spinal stenosis, lumbar region without neurogenic claudication: Secondary | ICD-10-CM | POA: Diagnosis not present

## 2021-05-27 HISTORY — DX: Occlusion and stenosis of bilateral carotid arteries: I65.23

## 2021-05-27 HISTORY — DX: Hyperlipidemia, unspecified: E78.5

## 2021-05-27 HISTORY — DX: Other chronic pain: M54.50

## 2021-05-27 HISTORY — DX: Other chronic pain: G89.29

## 2021-05-27 HISTORY — DX: Headache, unspecified: R51.9

## 2021-05-27 HISTORY — PX: LUMBAR LAMINECTOMY/DECOMPRESSION MICRODISCECTOMY: SHX5026

## 2021-05-27 HISTORY — DX: Peripheral vascular disease, unspecified: I73.9

## 2021-05-27 LAB — GLUCOSE, CAPILLARY
Glucose-Capillary: 106 mg/dL — ABNORMAL HIGH (ref 70–99)
Glucose-Capillary: 116 mg/dL — ABNORMAL HIGH (ref 70–99)

## 2021-05-27 SURGERY — LUMBAR LAMINECTOMY/DECOMPRESSION MICRODISCECTOMY 2 LEVELS
Anesthesia: General

## 2021-05-27 MED ORDER — GLYCOPYRROLATE 0.2 MG/ML IJ SOLN
INTRAMUSCULAR | Status: DC | PRN
  Administered 2021-05-27: .2 mg via INTRAVENOUS

## 2021-05-27 MED ORDER — MIDAZOLAM HCL 2 MG/2ML IJ SOLN
INTRAMUSCULAR | Status: DC | PRN
  Administered 2021-05-27: 2 mg via INTRAVENOUS

## 2021-05-27 MED ORDER — AMITRIPTYLINE HCL 25 MG PO TABS
25.0000 mg | ORAL_TABLET | Freq: Every day | ORAL | Status: DC
  Administered 2021-05-27: 25 mg via ORAL
  Filled 2021-05-27: qty 1

## 2021-05-27 MED ORDER — ROCURONIUM BROMIDE 100 MG/10ML IV SOLN
INTRAVENOUS | Status: DC | PRN
  Administered 2021-05-27: 10 mg via INTRAVENOUS

## 2021-05-27 MED ORDER — CEPHALEXIN 250 MG PO CAPS
250.0000 mg | ORAL_CAPSULE | ORAL | Status: DC
  Administered 2021-05-28: 250 mg via ORAL
  Filled 2021-05-27 (×2): qty 1

## 2021-05-27 MED ORDER — SUCCINYLCHOLINE CHLORIDE 200 MG/10ML IV SOSY
PREFILLED_SYRINGE | INTRAVENOUS | Status: DC | PRN
  Administered 2021-05-27: 100 mg via INTRAVENOUS

## 2021-05-27 MED ORDER — SUGAMMADEX SODIUM 200 MG/2ML IV SOLN
INTRAVENOUS | Status: DC | PRN
  Administered 2021-05-27: 100 mg via INTRAVENOUS

## 2021-05-27 MED ORDER — ORAL CARE MOUTH RINSE: 15.0000 mL | Freq: Once | OROMUCOSAL | Status: AC

## 2021-05-27 MED ORDER — ASCORBIC ACID 500 MG PO TABS
1000.0000 mg | ORAL_TABLET | Freq: Every day | ORAL | Status: DC
  Administered 2021-05-27: 1000 mg via ORAL
  Filled 2021-05-27: qty 2

## 2021-05-27 MED ORDER — TIZANIDINE HCL 4 MG PO TABS
4.0000 mg | ORAL_TABLET | Freq: Three times a day (TID) | ORAL | Status: DC
  Administered 2021-05-27 – 2021-05-28 (×3): 4 mg via ORAL
  Filled 2021-05-27 (×5): qty 1

## 2021-05-27 MED ORDER — PHENYLEPHRINE HCL-NACL 20-0.9 MG/250ML-% IV SOLN
INTRAVENOUS | Status: DC | PRN
  Administered 2021-05-27: 30 ug/min via INTRAVENOUS

## 2021-05-27 MED ORDER — CHLORHEXIDINE GLUCONATE 0.12 % MT SOLN
OROMUCOSAL | Status: AC
  Administered 2021-05-27: 15 mL via OROMUCOSAL
  Filled 2021-05-27: qty 15

## 2021-05-27 MED ORDER — SODIUM CHLORIDE 0.9 % IV SOLN: INTRAVENOUS | Status: DC

## 2021-05-27 MED ORDER — FENTANYL CITRATE (PF) 100 MCG/2ML IJ SOLN
INTRAMUSCULAR | Status: AC
  Filled 2021-05-27: qty 2

## 2021-05-27 MED ORDER — ALBUTEROL SULFATE HFA 108 (90 BASE) MCG/ACT IN AERS
INHALATION_SPRAY | RESPIRATORY_TRACT | Status: DC | PRN
  Administered 2021-05-27: 2 via RESPIRATORY_TRACT

## 2021-05-27 MED ORDER — MENTHOL 3 MG MT LOZG
1.0000 | LOZENGE | OROMUCOSAL | Status: DC | PRN
  Filled 2021-05-27: qty 9

## 2021-05-27 MED ORDER — CITALOPRAM HYDROBROMIDE 20 MG PO TABS
20.0000 mg | ORAL_TABLET | ORAL | Status: DC
  Administered 2021-05-28: 20 mg via ORAL
  Filled 2021-05-27 (×2): qty 1

## 2021-05-27 MED ORDER — SODIUM CHLORIDE FLUSH 0.9 % IV SOLN
INTRAVENOUS | Status: AC
  Filled 2021-05-27: qty 20

## 2021-05-27 MED ORDER — ALBUTEROL SULFATE (2.5 MG/3ML) 0.083% IN NEBU: 2.5000 mg | INHALATION_SOLUTION | Freq: Four times a day (QID) | RESPIRATORY_TRACT | Status: DC | PRN

## 2021-05-27 MED ORDER — OXYCODONE HCL 5 MG PO TABS
5.0000 mg | ORAL_TABLET | Freq: Once | ORAL | Status: AC | PRN
  Administered 2021-05-27: 5 mg via ORAL

## 2021-05-27 MED ORDER — ONDANSETRON HCL 4 MG/2ML IJ SOLN: 4.0000 mg | Freq: Once | INTRAMUSCULAR | Status: DC | PRN

## 2021-05-27 MED ORDER — ONDANSETRON HCL 4 MG PO TABS: 4.0000 mg | ORAL_TABLET | Freq: Four times a day (QID) | ORAL | Status: DC | PRN

## 2021-05-27 MED ORDER — OXYBUTYNIN CHLORIDE ER 10 MG PO TB24
10.0000 mg | ORAL_TABLET | Freq: Every day | ORAL | Status: DC
  Administered 2021-05-27: 10 mg via ORAL
  Filled 2021-05-27 (×2): qty 1

## 2021-05-27 MED ORDER — LIDOCAINE HCL (CARDIAC) PF 100 MG/5ML IV SOSY
PREFILLED_SYRINGE | INTRAVENOUS | Status: DC | PRN
  Administered 2021-05-27: 80 mg via INTRAVENOUS

## 2021-05-27 MED ORDER — ACETAMINOPHEN 10 MG/ML IV SOLN: 1000.0000 mg | Freq: Once | INTRAVENOUS | Status: DC | PRN

## 2021-05-27 MED ORDER — CEFAZOLIN SODIUM-DEXTROSE 2-4 GM/100ML-% IV SOLN
INTRAVENOUS | Status: AC
  Filled 2021-05-27: qty 100

## 2021-05-27 MED ORDER — MIDAZOLAM HCL 2 MG/2ML IJ SOLN
INTRAMUSCULAR | Status: AC
  Filled 2021-05-27: qty 2

## 2021-05-27 MED ORDER — VITAMIN B-12 1000 MCG PO TABS
1000.0000 ug | ORAL_TABLET | Freq: Every day | ORAL | Status: DC
Start: 2021-05-27 — End: 2021-05-28
  Administered 2021-05-27 – 2021-05-28 (×2): 1000 ug via ORAL
  Filled 2021-05-27 (×2): qty 1

## 2021-05-27 MED ORDER — MAGNESIUM OXIDE 400 MG PO TABS
400.0000 mg | ORAL_TABLET | Freq: Every day | ORAL | Status: DC
  Administered 2021-05-27: 400 mg via ORAL
  Filled 2021-05-27 (×3): qty 1

## 2021-05-27 MED ORDER — PHENOL 1.4 % MT LIQD
1.0000 | OROMUCOSAL | Status: DC | PRN
  Filled 2021-05-27: qty 177

## 2021-05-27 MED ORDER — BISACODYL 5 MG PO TBEC: 5.0000 mg | DELAYED_RELEASE_TABLET | Freq: Every day | ORAL | Status: DC | PRN

## 2021-05-27 MED ORDER — BUPIVACAINE-EPINEPHRINE (PF) 0.5% -1:200000 IJ SOLN
INTRAMUSCULAR | Status: AC
  Filled 2021-05-27: qty 30

## 2021-05-27 MED ORDER — SODIUM CHLORIDE (PF) 0.9 % IJ SOLN
INTRAMUSCULAR | Status: DC | PRN
  Administered 2021-05-27: 60 mL via INTRAMUSCULAR

## 2021-05-27 MED ORDER — FENTANYL CITRATE (PF) 100 MCG/2ML IJ SOLN
25.0000 ug | INTRAMUSCULAR | Status: DC | PRN
  Administered 2021-05-27 (×3): 25 ug via INTRAVENOUS
  Administered 2021-05-27: 50 ug via INTRAVENOUS
  Administered 2021-05-27: 25 ug via INTRAVENOUS

## 2021-05-27 MED ORDER — ACETAMINOPHEN 10 MG/ML IV SOLN
INTRAVENOUS | Status: DC | PRN
  Administered 2021-05-27: 1000 mg via INTRAVENOUS

## 2021-05-27 MED ORDER — OXYCODONE HCL 5 MG PO TABS
ORAL_TABLET | ORAL | Status: AC
  Filled 2021-05-27: qty 1

## 2021-05-27 MED ORDER — PROPOFOL 10 MG/ML IV BOLUS
INTRAVENOUS | Status: DC | PRN
  Administered 2021-05-27: 50 mg via INTRAVENOUS
  Administered 2021-05-27: 150 mg via INTRAVENOUS

## 2021-05-27 MED ORDER — ACETAMINOPHEN 500 MG PO TABS
1000.0000 mg | ORAL_TABLET | Freq: Four times a day (QID) | ORAL | Status: DC
  Administered 2021-05-27 – 2021-05-28 (×3): 1000 mg via ORAL
  Filled 2021-05-27 (×3): qty 2

## 2021-05-27 MED ORDER — VITAMIN D 25 MCG (1000 UNIT) PO TABS
1000.0000 [IU] | ORAL_TABLET | Freq: Every day | ORAL | Status: DC
Start: 2021-05-27 — End: 2021-05-28
  Administered 2021-05-27: 1000 [IU] via ORAL
  Filled 2021-05-27: qty 1

## 2021-05-27 MED ORDER — LACTATED RINGERS IV SOLN: INTRAVENOUS | Status: DC

## 2021-05-27 MED ORDER — REMIFENTANIL HCL 1 MG IV SOLR
INTRAVENOUS | Status: DC | PRN
  Administered 2021-05-27: .1 ug/kg/min via INTRAVENOUS

## 2021-05-27 MED ORDER — BUPIVACAINE LIPOSOME 1.3 % IJ SUSP
INTRAMUSCULAR | Status: AC
  Filled 2021-05-27: qty 20

## 2021-05-27 MED ORDER — BUPIVACAINE HCL (PF) 0.5 % IJ SOLN
INTRAMUSCULAR | Status: AC
  Filled 2021-05-27: qty 30

## 2021-05-27 MED ORDER — PANTOPRAZOLE SODIUM 40 MG PO TBEC
40.0000 mg | DELAYED_RELEASE_TABLET | Freq: Every day | ORAL | Status: DC
  Administered 2021-05-27 – 2021-05-28 (×2): 40 mg via ORAL
  Filled 2021-05-27 (×2): qty 1

## 2021-05-27 MED ORDER — DEXAMETHASONE SODIUM PHOSPHATE 10 MG/ML IJ SOLN
INTRAMUSCULAR | Status: DC | PRN
  Administered 2021-05-27: 10 mg via INTRAVENOUS

## 2021-05-27 MED ORDER — ALBUTEROL SULFATE HFA 108 (90 BASE) MCG/ACT IN AERS: 2.0000 | INHALATION_SPRAY | Freq: Four times a day (QID) | RESPIRATORY_TRACT | Status: DC | PRN

## 2021-05-27 MED ORDER — MORPHINE SULFATE (PF) 2 MG/ML IV SOLN
2.0000 mg | INTRAVENOUS | Status: DC | PRN
  Administered 2021-05-27 – 2021-05-28 (×3): 2 mg via INTRAVENOUS
  Filled 2021-05-27 (×3): qty 1

## 2021-05-27 MED ORDER — METHYLPREDNISOLONE ACETATE 40 MG/ML IJ SUSP
INTRAMUSCULAR | Status: AC
  Filled 2021-05-27: qty 1

## 2021-05-27 MED ORDER — POLYETHYLENE GLYCOL 3350 17 G PO PACK: 17.0000 g | PACK | Freq: Every day | ORAL | Status: DC | PRN

## 2021-05-27 MED ORDER — CEFAZOLIN SODIUM-DEXTROSE 2-4 GM/100ML-% IV SOLN
2.0000 g | Freq: Once | INTRAVENOUS | Status: AC
  Administered 2021-05-27: 2 g via INTRAVENOUS

## 2021-05-27 MED ORDER — METFORMIN HCL 500 MG PO TABS
500.0000 mg | ORAL_TABLET | Freq: Two times a day (BID) | ORAL | Status: DC
  Administered 2021-05-27 – 2021-05-28 (×2): 500 mg via ORAL
  Filled 2021-05-27 (×2): qty 1

## 2021-05-27 MED ORDER — ONDANSETRON HCL 4 MG/2ML IJ SOLN: 4.0000 mg | Freq: Four times a day (QID) | INTRAMUSCULAR | Status: DC | PRN

## 2021-05-27 MED ORDER — SODIUM CHLORIDE 0.9% FLUSH: 3.0000 mL | INTRAVENOUS | Status: DC | PRN

## 2021-05-27 MED ORDER — OXYCODONE HCL 5 MG PO TABS
10.0000 mg | ORAL_TABLET | ORAL | Status: DC | PRN
  Administered 2021-05-27 – 2021-05-28 (×3): 10 mg via ORAL
  Filled 2021-05-27 (×3): qty 2

## 2021-05-27 MED ORDER — ACETAMINOPHEN 10 MG/ML IV SOLN
INTRAVENOUS | Status: AC
  Filled 2021-05-27: qty 100

## 2021-05-27 MED ORDER — SULFASALAZINE 500 MG PO TBEC
500.0000 mg | DELAYED_RELEASE_TABLET | Freq: Two times a day (BID) | ORAL | Status: DC
  Administered 2021-05-27 – 2021-05-28 (×2): 500 mg via ORAL
  Filled 2021-05-27 (×3): qty 1

## 2021-05-27 MED ORDER — HYDROXYZINE HCL 25 MG PO TABS
25.0000 mg | ORAL_TABLET | Freq: Every day | ORAL | Status: DC
  Administered 2021-05-27: 25 mg via ORAL
  Filled 2021-05-27 (×2): qty 1

## 2021-05-27 MED ORDER — KETOROLAC TROMETHAMINE 15 MG/ML IJ SOLN
15.0000 mg | Freq: Four times a day (QID) | INTRAMUSCULAR | Status: AC
  Administered 2021-05-27 – 2021-05-28 (×3): 15 mg via INTRAVENOUS
  Filled 2021-05-27 (×3): qty 1

## 2021-05-27 MED ORDER — REMIFENTANIL HCL 1 MG IV SOLR
INTRAVENOUS | Status: AC
  Filled 2021-05-27: qty 1000

## 2021-05-27 MED ORDER — BUPIVACAINE-EPINEPHRINE (PF) 0.5% -1:200000 IJ SOLN
INTRAMUSCULAR | Status: DC | PRN
  Administered 2021-05-27: 4 mL

## 2021-05-27 MED ORDER — OXYCODONE HCL 5 MG/5ML PO SOLN: 5.0000 mg | Freq: Once | ORAL | Status: AC | PRN

## 2021-05-27 MED ORDER — FLEET ENEMA 7-19 GM/118ML RE ENEM: 1.0000 | ENEMA | Freq: Once | RECTAL | Status: DC | PRN

## 2021-05-27 MED ORDER — ONDANSETRON HCL 4 MG/2ML IJ SOLN
INTRAMUSCULAR | Status: DC | PRN
  Administered 2021-05-27 (×2): 4 mg via INTRAVENOUS

## 2021-05-27 MED ORDER — CELECOXIB 100 MG PO CAPS: 100.0000 mg | ORAL_CAPSULE | Freq: Two times a day (BID) | ORAL | Status: DC

## 2021-05-27 MED ORDER — GABAPENTIN 600 MG PO TABS
900.0000 mg | ORAL_TABLET | Freq: Two times a day (BID) | ORAL | Status: DC
  Administered 2021-05-27 – 2021-05-28 (×2): 900 mg via ORAL
  Filled 2021-05-27 (×2): qty 2

## 2021-05-27 MED ORDER — IRBESARTAN 75 MG PO TABS
75.0000 mg | ORAL_TABLET | Freq: Every day | ORAL | Status: DC
  Administered 2021-05-28: 75 mg via ORAL
  Filled 2021-05-27: qty 1

## 2021-05-27 MED ORDER — AMLODIPINE BESYLATE 5 MG PO TABS
5.0000 mg | ORAL_TABLET | ORAL | Status: DC
  Administered 2021-05-28: 5 mg via ORAL
  Filled 2021-05-27: qty 1

## 2021-05-27 MED ORDER — FAMOTIDINE 20 MG PO TABS: 20.0000 mg | ORAL_TABLET | Freq: Once | ORAL | Status: AC

## 2021-05-27 MED ORDER — CHLORHEXIDINE GLUCONATE 0.12 % MT SOLN: 15.0000 mL | Freq: Once | OROMUCOSAL | Status: AC

## 2021-05-27 MED ORDER — FENTANYL CITRATE (PF) 100 MCG/2ML IJ SOLN
INTRAMUSCULAR | Status: DC | PRN
  Administered 2021-05-27 (×2): 50 ug via INTRAVENOUS

## 2021-05-27 MED ORDER — ATORVASTATIN CALCIUM 80 MG PO TABS
80.0000 mg | ORAL_TABLET | Freq: Every day | ORAL | Status: DC
  Administered 2021-05-27: 80 mg via ORAL
  Filled 2021-05-27: qty 4
  Filled 2021-05-27 (×2): qty 1

## 2021-05-27 MED ORDER — SODIUM CHLORIDE 0.9% FLUSH
3.0000 mL | Freq: Two times a day (BID) | INTRAVENOUS | Status: DC
  Administered 2021-05-27 – 2021-05-28 (×3): 3 mL via INTRAVENOUS

## 2021-05-27 MED ORDER — SENNA 8.6 MG PO TABS
1.0000 | ORAL_TABLET | Freq: Two times a day (BID) | ORAL | Status: DC
  Administered 2021-05-27 – 2021-05-28 (×3): 8.6 mg via ORAL
  Filled 2021-05-27 (×3): qty 1

## 2021-05-27 MED ORDER — OXYCODONE HCL 5 MG PO TABS: 5.0000 mg | ORAL_TABLET | ORAL | Status: DC | PRN

## 2021-05-27 MED ORDER — EPHEDRINE SULFATE 50 MG/ML IJ SOLN
INTRAMUSCULAR | Status: DC | PRN
  Administered 2021-05-27: 5 mg via INTRAVENOUS

## 2021-05-27 MED ORDER — PENTOSAN POLYSULFATE SODIUM 100 MG PO CAPS
100.0000 mg | ORAL_CAPSULE | Freq: Two times a day (BID) | ORAL | Status: DC
  Administered 2021-05-27 – 2021-05-28 (×2): 100 mg via ORAL
  Filled 2021-05-27 (×3): qty 1

## 2021-05-27 MED ORDER — METHYLPREDNISOLONE ACETATE 40 MG/ML IJ SUSP
INTRAMUSCULAR | Status: DC | PRN
  Administered 2021-05-27: 40 mg

## 2021-05-27 MED ORDER — SODIUM CHLORIDE 0.9 % IV SOLN: 250.0000 mL | INTRAVENOUS | Status: DC

## 2021-05-27 MED ORDER — FLUTICASONE FUROATE-VILANTEROL 200-25 MCG/ACT IN AEPB
1.0000 | INHALATION_SPRAY | Freq: Every day | RESPIRATORY_TRACT | Status: DC
  Administered 2021-05-28: 1 via RESPIRATORY_TRACT
  Filled 2021-05-27: qty 28

## 2021-05-27 MED ORDER — FAMOTIDINE 20 MG PO TABS
ORAL_TABLET | ORAL | Status: AC
  Administered 2021-05-27: 20 mg via ORAL
  Filled 2021-05-27: qty 1

## 2021-05-27 SURGICAL SUPPLY — 51 items
BUR NEURO DRILL SOFT 3.0X3.8M (BURR) ×2 IMPLANT
CHLORAPREP W/TINT 26 (MISCELLANEOUS) ×2 IMPLANT
CNTNR SPEC 2.5X3XGRAD LEK (MISCELLANEOUS) ×1
CONT SPEC 4OZ STER OR WHT (MISCELLANEOUS) ×1
CONT SPEC 4OZ STRL OR WHT (MISCELLANEOUS) ×1
CONTAINER SPEC 2.5X3XGRAD LEK (MISCELLANEOUS) ×1 IMPLANT
COUNTER NEEDLE 20/40 LG (NEEDLE) ×2 IMPLANT
CUP MEDICINE 2OZ PLAST GRAD ST (MISCELLANEOUS) ×2 IMPLANT
DERMABOND ADVANCED (GAUZE/BANDAGES/DRESSINGS) ×1
DERMABOND ADVANCED .7 DNX12 (GAUZE/BANDAGES/DRESSINGS) ×1 IMPLANT
DRAPE C ARM PK CFD 31 SPINE (DRAPES) ×4 IMPLANT
DRAPE C-ARM XRAY 36X54 (DRAPES) ×2 IMPLANT
DRAPE LAPAROTOMY 100X77 ABD (DRAPES) ×2 IMPLANT
DRAPE MICROSCOPE SPINE 48X150 (DRAPES) ×2 IMPLANT
DRAPE SURG 17X11 SM STRL (DRAPES) ×8 IMPLANT
DRSG OPSITE POSTOP 4X6 (GAUZE/BANDAGES/DRESSINGS) ×2 IMPLANT
DRSG TEGADERM 4X10 (GAUZE/BANDAGES/DRESSINGS) ×2 IMPLANT
ELECT CAUTERY BLADE TIP 2.5 (TIP) ×2
ELECT EZSTD 165MM 6.5IN (MISCELLANEOUS) ×2
ELECT REM PT RETURN 9FT ADLT (ELECTROSURGICAL) ×2
ELECTRODE CAUTERY BLDE TIP 2.5 (TIP) ×1 IMPLANT
ELECTRODE EZSTD 165MM 6.5IN (MISCELLANEOUS) ×1 IMPLANT
ELECTRODE REM PT RTRN 9FT ADLT (ELECTROSURGICAL) ×1 IMPLANT
GAUZE 4X4 16PLY ~~LOC~~+RFID DBL (SPONGE) ×2 IMPLANT
GLOVE SURG SYN 6.5 ES PF (GLOVE) ×2 IMPLANT
GLOVE SURG SYN 8.5  E (GLOVE) ×6
GLOVE SURG SYN 8.5 E (GLOVE) ×3 IMPLANT
GLOVE SURG UNDER POLY LF SZ6.5 (GLOVE) ×2 IMPLANT
GOWN SRG LRG LVL 4 IMPRV REINF (GOWNS) ×1 IMPLANT
GOWN SRG XL LVL 3 NONREINFORCE (GOWNS) ×1 IMPLANT
GOWN STRL NON-REIN TWL XL LVL3 (GOWNS) ×2
GOWN STRL REIN LRG LVL4 (GOWNS) ×2
GRADUATE 1200CC STRL 31836 (MISCELLANEOUS) ×2 IMPLANT
KIT SPINAL PRONEVIEW (KITS) ×2 IMPLANT
MANIFOLD NEPTUNE II (INSTRUMENTS) ×2 IMPLANT
MARKER SKIN DUAL TIP RULER LAB (MISCELLANEOUS) ×4 IMPLANT
NDL SAFETY ECLIPSE 18X1.5 (NEEDLE) ×1 IMPLANT
NEEDLE HYPO 18GX1.5 SHARP (NEEDLE) ×2
NEEDLE HYPO 22GX1.5 SAFETY (NEEDLE) ×2 IMPLANT
NS IRRIG 1000ML POUR BTL (IV SOLUTION) ×2 IMPLANT
PACK LAMINECTOMY NEURO (CUSTOM PROCEDURE TRAY) ×2 IMPLANT
SURGIFLO W/THROMBIN 8M KIT (HEMOSTASIS) ×2 IMPLANT
SUT DVC VLOC 3-0 CL 6 P-12 (SUTURE) ×2 IMPLANT
SUT VIC AB 0 CT1 27 (SUTURE) ×2
SUT VIC AB 0 CT1 27XCR 8 STRN (SUTURE) ×1 IMPLANT
SUT VIC AB 2-0 CT1 18 (SUTURE) ×2 IMPLANT
SYR 30ML LL (SYRINGE) ×4 IMPLANT
SYR 3ML LL SCALE MARK (SYRINGE) ×2 IMPLANT
TOWEL OR 17X26 4PK STRL BLUE (TOWEL DISPOSABLE) ×6 IMPLANT
TUBING CONNECTING 10 (TUBING) ×2 IMPLANT
WATER STERILE IRR 500ML POUR (IV SOLUTION) ×2 IMPLANT

## 2021-05-27 NOTE — H&P (Signed)
I have reviewed and confirmed my history and physical from 05/13/21 with no additions or changes. Plan for L4-S1 decompression.  Risks and benefits reviewed.  Heart sounds normal no MRG. Chest Clear to Auscultation Bilaterally.

## 2021-05-27 NOTE — Anesthesia Postprocedure Evaluation (Signed)
Anesthesia Post Note  Patient: Tammy Lamb  Procedure(s) Performed: L4-S1 DECOMPRESSION  Patient location during evaluation: PACU Anesthesia Type: General Level of consciousness: awake and alert, oriented and patient cooperative Pain management: pain level controlled Vital Signs Assessment: post-procedure vital signs reviewed and stable Respiratory status: spontaneous breathing, nonlabored ventilation and respiratory function stable Cardiovascular status: blood pressure returned to baseline and stable Postop Assessment: adequate PO intake Anesthetic complications: no   No notable events documented.   Last Vitals:  Vitals:   05/27/21 1415 05/27/21 1420  BP: 136/88   Pulse: 90 87  Resp: (!) 22 15  Temp:    SpO2: 94% 94%    Last Pain:  Vitals:   05/27/21 1420  TempSrc:   PainSc: [place]

## 2021-05-27 NOTE — Anesthesia Preprocedure Evaluation (Addendum)
Anesthesia Evaluation  Patient identified by MRN, date of birth, ID band Patient awake    Reviewed: Allergy & Precautions, NPO status , Patient's Chart, lab work & pertinent test results  History of Anesthesia Complications Negative for: history of anesthetic complications  Airway Mallampati: I   Neck ROM: Full    Dental  (+)    Pulmonary COPD,  COPD inhaler, Current Smoker and Patient abstained from smoking.,    Pulmonary exam normal breath sounds clear to auscultation       Cardiovascular hypertension, Pt. on medications + Peripheral Vascular Disease  Normal cardiovascular exam Rhythm:Regular Rate:Normal  Bilateral Carotid Artery Stenosis- less than 50% stenosis bilaterally  Myocardial Perfusion Scan: 03/2021: LVEF= 61%  FINDINGS:  Regional wall motion: reveals normal myocardial thickening and wall  motion.  The overall quality of the study is good.   Artifacts noted: no  Left ventricular cavity: normal.    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)   TIA (2015 due to low blood pressure per patient) Neuromuscular disease (lumbar radiculopathy) CVA (2015, no residual deficits)    GI/Hepatic Neg liver ROS, hiatal hernia, PUD, GERD  Medicated and Controlled,  Endo/Other  diabetes, Well Controlled, Type 2, Oral Hypoglycemic Agents  Renal/GU negative Renal ROS     Musculoskeletal  (+) Arthritis ,   Abdominal   Peds  Hematology negative hematology ROS (+)   Anesthesia Other Findings   Reproductive/Obstetrics                            Anesthesia Physical  Anesthesia Plan  ASA: 3  Anesthesia Plan: General   Post-op Pain Management:    Induction: Intravenous  PONV Risk Score and Plan: 2 and Dexamethasone and Ondansetron  Airway Management Planned: Oral ETT  Additional Equipment:   Intra-op Plan:   Post-operative Plan: Extubation in OR  Informed Consent: I have reviewed the patients History and Physical, chart, labs and discussed the procedure including the risks, benefits and alternatives for the proposed anesthesia with the patient or authorized representative who has indicated his/her understanding and acceptance.     Dental advisory given  Plan Discussed with: CRNA  Anesthesia Plan Comments: (Patient consented for risks of anesthesia including but not limited to:  - adverse reactions to medications - damage to eyes, teeth, lips or other oral mucosa - nerve damage due to positioning  - sore throat or hoarseness - damage to heart, brain, nerves, lungs, other parts of body or loss of life  Informed patient about role of CRNA in peri- and intra-operative care.  Patient voiced understanding.)       Anesthesia Quick Evaluation

## 2021-05-27 NOTE — Anesthesia Procedure Notes (Signed)
Procedure Name: Intubation Date/Time: 05/27/2021 11:51 AM Performed by: Kelton Pillar, CRNA Pre-anesthesia Checklist: Patient identified, Emergency Drugs available, Suction available and Patient being monitored Patient Re-evaluated:Patient Re-evaluated prior to induction Oxygen Delivery Method: Circle system utilized Preoxygenation: Pre-oxygenation with 100% oxygen Induction Type: IV induction Ventilation: Mask ventilation without difficulty Laryngoscope Size: McGraph and 3 Grade View: Grade I Tube type: Oral Tube size: 6.5 mm Number of attempts: 1 Airway Equipment and Method: Stylet and Oral airway Placement Confirmation: ETT inserted through vocal cords under direct vision, positive ETCO2, breath sounds checked- equal and bilateral and CO2 detector Secured at: 21 cm Tube secured with: Tape Dental Injury: Teeth and Oropharynx as per pre-operative assessment

## 2021-05-27 NOTE — Transfer of Care (Signed)
Immediate Anesthesia Transfer of Care Note  Patient: Tammy Lamb  Procedure(s) Performed: L4-S1 DECOMPRESSION  Patient Location: PACU  Anesthesia Type:General  Level of Consciousness: awake, drowsy and patient cooperative  Airway & Oxygen Therapy: Patient Spontanous Breathing and Patient connected to face mask oxygen  Post-op Assessment: Report given to RN and Post -op Vital signs reviewed and stable  Post vital signs: Reviewed and stable  Last Vitals:  Vitals Value Taken Time  BP 148/95 05/27/21 1353  Temp    Pulse 99 05/27/21 1354  Resp 20 05/27/21 1354  SpO2 100 % 05/27/21 1354  Vitals shown include unvalidated device data.  Last Pain:  Vitals:   05/27/21 1039  TempSrc: Temporal  PainSc: 6          Complications: No notable events documented.

## 2021-05-27 NOTE — Discharge Instructions (Addendum)
Your surgeon has performed an operation on your lumbar spine (low back) to relieve pressure on one or more nerves. Many times, patients feel better immediately after surgery and can "overdo it." Even if you feel well, it is important that you follow these activity guidelines. If you do not let your back heal properly from the surgery, you can increase the chance of a disc herniation and/or return of your symptoms. The following are instructions to help in your recovery once you have been discharged from the hospital.  * Do not take anti-inflammatory medications for 3 days after surgery (naproxen [Aleve], ibuprofen [Advil, Motrin], , etc.)    Celebrex is ok to take.  Activity    No bending, lifting, or twisting ("BLT"). Avoid lifting objects heavier than 10 pounds (gallon milk jug).  Where possible, avoid household activities that involve lifting, bending, pushing, or pulling such as laundry, vacuuming, grocery shopping, and childcare. Try to arrange for help from friends and family for these activities while your back heals.  Increase physical activity slowly as tolerated.  Taking short walks is encouraged, but avoid strenuous exercise. Do not jog, run, bicycle, lift weights, or participate in any other exercises unless specifically allowed by your doctor. Avoid prolonged sitting, including car rides.  Talk to your doctor before resuming sexual activity.  You should not drive until cleared by your doctor.  Until released by your doctor, you should not return to work or school.  You should rest at home and let your body heal.   You may shower two days after your surgery.  Try to keep the dressing dry while it is in place. After showering, lightly dab your incision dry. Do not take a tub bath or go swimming for 3 weeks, or until approved by your doctor at your follow-up appointment.  If you smoke, we strongly recommend that you quit.  Smoking has been proven to interfere with normal healing in  your back and will dramatically reduce the success rate of your surgery. Please contact [facility] (800-QUIT-NOW) and use the resources at www.[facility].com for assistance in stopping smoking.  Surgical Incision   If you have a dressing on your incision, you may remove it three days after your surgery (on Monday November 7th). Keep your incision area clean and dry.  If you have staples or stitches on your incision, you should have a follow up scheduled for removal. If you do not have staples or stitches, you will have steri-strips (small pieces of surgical tape) or Dermabond glue. The steri-strips/glue should begin to peel away within about a week (it is fine if the steri-strips fall off before then). If the strips are still in place one week after your surgery, you may gently remove them.  Diet            You may return to your usual diet. Be sure to stay hydrated.  When to Contact Tammy Lamb  Although your surgery and recovery will likely be uneventful, you may have some residual numbness, aches, and pains in your back and/or legs. This is normal and should improve in the next few weeks.  However, should you experience any of the following, contact Tammy Lamb immediately: New numbness or weakness Pain that is progressively getting worse, and is not relieved by your pain medications or rest Bleeding, redness, swelling, pain, or drainage from surgical incision Chills or flu-like symptoms Fever greater than 101.0 F (38.3 C) Problems with bowel or bladder functions Difficulty breathing or shortness of breath Warmth,  tenderness, or swelling in your calf  Contact Information During office hours (Monday-Friday 9 am to 5 pm), please call your physician at [phone] After hours and weekends, please call [phone] and speak with the answering service, who will contact the doctor on call.  If that fails, call the [place] Operator at [phone] and ask for the Neurosurgery Resident On Call  For a  life-threatening emergency, call 911

## 2021-05-27 NOTE — Op Note (Signed)
Indications: Mrs. Tammy Lamb is a 70 yo female who presented with lumbar radiculopathy.  She failed conservative management prompting surgical intervention.  Findings: lateral recess stenosis  Preoperative Diagnosis: Lumbar radiculopathy Postoperative Diagnosis: same   EBL: 20 ml IVF: 500 ml Drains: none Disposition: Extubated and Stable to PACU Complications: none  No foley catheter was placed.   Preoperative Note:   Risks of surgery discussed include: infection, bleeding, stroke, coma, death, paralysis, CSF leak, nerve/spinal cord injury, numbness, tingling, weakness, complex regional pain syndrome, recurrent stenosis and/or disc herniation, vascular injury, development of instability, neck/back pain, need for further surgery, persistent symptoms, development of deformity, and the risks of anesthesia. The patient understood these risks and agreed to proceed.  Operative Note:   1. L4-S1 lumbar decompression including central laminectomy and left medial facetectomies including foraminotomies  The patient was then brought from the preoperative center with intravenous access established.  The patient underwent general anesthesia and endotracheal tube intubation, and was then rotated on the [place] top where all pressure points were appropriately padded.  The skin was then thoroughly cleansed.  Perioperative antibiotic prophylaxis was administered.  Sterile prep and drapes were then applied and a timeout was then observed.  C-arm was brought into the field under sterile conditions and under lateral visualization the L5-S1 interspace was identified and marked.  The incision was marked on the left and injected with local anesthetic. Once this was complete a 4 cm incision was opened with the use of a #10 blade knife.    The metrx tubes were sequentially advanced and confirmed in position at L5-S1. An 60m by 737mtube was locked in place to the bed side attachment.   We then utilized the  CT-capable CT scanner to confirm localization.  After confirmation, we moved on to the remainder of the procedure.  The microscope was then sterilely brought into the field and muscle creep was hemostased with a bipolar and resected with a pituitary rongeur.  A Bovie extender was then used to expose the spinous process and lamina.  Careful attention was placed to not violate the facet capsule. A 3 mm matchstick drill bit was then used to make a hemi-laminotomy trough until the ligamentum flavum was exposed.  This was extended to the base of the spinous process and to the contralateral side to remove all the central bone from each side.  Once this was complete and the underlying ligamentum flavum was visualized, it was dissected with a curette and resected with Kerrison rongeurs.  Extensive ligamentum hypertrophy was noted, requiring a substantial amount of time and care for removal.  The dura was identified and palpated. The kerrison rongeur was then used to remove the medial facet bilaterally until no compression was noted.  A balltip probe was used to confirm decompression of the ipsilateral S1 nerve root.  No CSF leak was noted.  A Depo-Medrol soaked Gelfoam pledget was placed in the defect.  The wound was copiously irrigated. The tube system was then removed under microscopic visualization and hemostasis was obtained with a bipolar.    After performing the decompression at L5-S1, the metrx tubes were sequentially advanced and confirmed in position at L4-5. An [id number] was locked in place to the bed side attachment.  Fluoroscopy was then removed from the field.  The microscope was then sterilely brought into the field and muscle creep was hemostased with a bipolar and resected with a pituitary rongeur.  A Bovie extender was then used to expose  the spinous process and lamina.  Careful attention was placed to not violate the facet capsule. A 3 mm matchstick drill bit was then used to make a  hemi-laminotomy trough until the ligamentum flavum was exposed.  This was extended to the base of the spinous process and to the contralateral side to remove all the central bone from each side.  Once this was complete and the underlying ligamentum flavum was visualized, it was dissected with a curette and resected with Kerrison rongeurs.  Extensive ligamentum hypertrophy was noted, requiring a substantial amount of time and care for removal.  The dura was identified and palpated. The kerrison rongeur was then used to remove the medial facet bilaterally until no compression was noted.  A balltip probe was used to confirm decompression of the ipsilateral L5 nerve root.  No CSF leak was noted.  A Depo-Medrol soaked Gelfoam pledget was placed in the defect.  The wound was copiously irrigated. The tube system was then removed under microscopic visualization and hemostasis was obtained with a bipolar.     The fascial layer was reapproximated with the use of a 0 Vicryl suture.  Subcutaneous tissue layer was reapproximated using 2-0 Vicryl suture.  3-0 monocryl was placed in subcuticular fashion. The skin was then cleansed and Dermabond was used to close the skin opening.  Patient was then rotated back to the preoperative bed awakened from anesthesia and taken to recovery all counts are correct in this case.  I performed the entire procedure with the assistance of Cooper Render PA as an Pensions consultant.  Shail Urbas K. Izora Ribas MD

## 2021-05-28 ENCOUNTER — Encounter: Payer: Self-pay | Admitting: Neurosurgery

## 2021-05-28 DIAGNOSIS — M48061 Spinal stenosis, lumbar region without neurogenic claudication: Secondary | ICD-10-CM | POA: Diagnosis not present

## 2021-05-28 MED ORDER — CELECOXIB 200 MG PO CAPS: 200.0000 mg | ORAL_CAPSULE | Freq: Two times a day (BID) | ORAL | 0 refills | Status: DC

## 2021-05-28 MED ORDER — CELECOXIB 200 MG PO CAPS
200.0000 mg | ORAL_CAPSULE | Freq: Two times a day (BID) | ORAL | Status: DC
  Administered 2021-05-28: 200 mg via ORAL
  Filled 2021-05-28: qty 1

## 2021-05-28 MED ORDER — HYDROCODONE-ACETAMINOPHEN 10-325 MG PO TABS: 1.0000 | ORAL_TABLET | Freq: Four times a day (QID) | ORAL | 0 refills | Status: DC | PRN

## 2021-05-28 MED ORDER — POLYETHYLENE GLYCOL 3350 17 G PO PACK: 17.0000 g | PACK | Freq: Every day | ORAL | 0 refills | Status: DC | PRN

## 2021-05-28 MED ORDER — ACETAMINOPHEN 325 MG PO TABS
650.0000 mg | ORAL_TABLET | Freq: Four times a day (QID) | ORAL | Status: DC
  Administered 2021-05-28: 650 mg via ORAL
  Filled 2021-05-28: qty 2

## 2021-05-28 MED ORDER — HYDROCODONE-ACETAMINOPHEN 10-325 MG PO TABS: 1.0000 | ORAL_TABLET | Freq: Four times a day (QID) | ORAL | Status: DC | PRN

## 2021-05-28 NOTE — Discharge Summary (Signed)
Physician Discharge Summary  Patient ID: Tammy Lamb MRN: [id number] DOB/AGE: 1950/09/07 70 y.o.  Admit date: 05/27/2021 Discharge date: 05/28/2021  Admission Diagnoses: lumbar radiculopathy  Discharge Diagnoses:  Active Problems:   Lumbar radiculopathy   Discharged Condition: good  Hospital Course: Tammy Lamb was admitted for observation after a L4-S1 decompression for lumbar radiculopathy.  She had pain overnight requiring change in medications.  After ambulation on POD1 and evaluation by PTOT, she was felt to be stable for discharge.  Consults: None  Significant Diagnostic Studies: radiology: X-Ray: Localization intraoperative  Treatments: surgery: L4-S1 decompression  Discharge Exam: Blood pressure 109/67, pulse 82, temperature 99.1 F (37.3 C), resp. rate 16, height 5' 6"  (1.676 m), weight 78.9 kg, SpO2 94 %. General appearance: alert and cooperative  CNI 5/5 throghout BLE  Dressing with some blood around the incision but no drainage outside the dressing.  Disposition:   Discharge Instructions     Incentive spirometry RT   Complete by: As directed       Allergies as of 05/28/2021       Reactions   Aspirin Other (See Comments)   GERD   Sulfa Antibiotics Nausea And Vomiting        Medication List     TAKE these medications    acetaminophen 500 MG tablet Commonly known as: TYLENOL Take 1,000 mg by mouth every 6 (six) hours as needed for moderate pain or headache.   amitriptyline 25 MG tablet Commonly known as: ELAVIL Take 25 mg by mouth at bedtime.   amLODipine 5 MG tablet Commonly known as: NORVASC Take 5 mg by mouth every morning.   ascorbic acid 1000 MG tablet Commonly known as: VITAMIN C Take 1,000 mg by mouth at bedtime.   atorvastatin 80 MG tablet Commonly known as: LIPITOR Take 80 mg by mouth at bedtime.   celecoxib 200 MG capsule Commonly known as: CELEBREX Take 1 capsule (200 mg total) by mouth 2 (two) times daily.    cephALEXin 250 MG capsule Commonly known as: KEFLEX Take 250 mg by mouth every morning.   cholecalciferol 25 MCG (1000 UNIT) tablet Commonly known as: VITAMIN D Take 1,000 Units by mouth at bedtime.   Cinnamon 500 MG Tabs Take 500 mg by mouth in the morning and at bedtime.   citalopram 20 MG tablet Commonly known as: CELEXA Take 20 mg by mouth every morning.   fluticasone-salmeterol 250-50 MCG/ACT Aepb Commonly known as: ADVAIR Inhale 1 puff into the lungs in the morning and at bedtime.   gabapentin 600 MG tablet Commonly known as: Neurontin Take 1.5 tablets (900 mg total) by mouth 2 (two) times daily.   hydrochlorothiazide 25 MG tablet Commonly known as: HYDRODIURIL Take 25 mg by mouth every morning.   HYDROcodone-acetaminophen 10-325 MG tablet Commonly known as: NORCO Take 1 tablet by mouth every 6 (six) hours as needed for up to 7 days for moderate pain. What changed:  when to take this reasons to take this additional instructions Another medication with the same name was removed. Continue taking this medication, and follow the directions you see here. Notes to patient: OK to go back to home twice daily dosing as pain improves.  Contact office if you get low on medication from this prescription.   hydrOXYzine 25 MG tablet Commonly known as: ATARAX/VISTARIL Take 25 mg by mouth at bedtime.   magnesium oxide 400 MG tablet Commonly known as: MAG-OX Take 400 mg by mouth at bedtime.   metFORMIN 500 MG tablet  Commonly known as: GLUCOPHAGE Take 500 mg by mouth 2 (two) times daily with a meal.   Omeprazole-Sodium Bicarbonate 20-1100 MG Caps capsule Commonly known as: ZEGERID Take 1 capsule by mouth in the morning and at bedtime.   oxybutynin 10 MG 24 hr tablet Commonly known as: DITROPAN-XL Take 10 mg by mouth at bedtime.   Pentosan Polysulfate Sodium 150 MG Cpdr Take 150 mg by mouth in the morning and at bedtime.   polyethylene glycol 17 g packet Commonly  known as: MIRALAX / GLYCOLAX Take 17 g by mouth daily as needed for mild constipation.   sulfaSALAzine 500 MG tablet Commonly known as: AZULFIDINE Take 500 mg by mouth 2 (two) times daily.   tiZANidine 4 MG tablet Commonly known as: ZANAFLEX Take 1 tablet (4 mg total) by mouth every 8 (eight) hours as needed. What changed: when to take this   valsartan 80 MG tablet Commonly known as: DIOVAN Take 80 mg by mouth at bedtime.   Ventolin HFA 108 (90 Base) MCG/ACT inhaler Generic drug: albuterol Inhale 2 puffs into the lungs every 6 (six) hours as needed for wheezing or shortness of breath.   vitamin B-12 1000 MCG tablet Commonly known as: CYANOCOBALAMIN Take 1,000 mcg by mouth daily.        Follow-up Information     Tammy Dicker, PA Follow up in 2 month(s).   Why: for incision check and post-op follow up. This appointment should already be scheduled with [facility]. Please call with any questions or concerns regarding appointment date and time Contact information: [place] [address] [phone]                 Signed: Meade Maw 05/28/2021, 8:09 AM

## 2021-05-28 NOTE — Evaluation (Signed)
Occupational Therapy Evaluation Patient Details Name: Tammy Lamb MRN: [id number] DOB: 01/04/1951 Today's Date: 05/28/2021   History of Present Illness Pt admitted to [facility] on 05/27/21 under observation for elective L4-S1 lumbar decompression including central laminectomy and L medial facetectomies including foraminotomies. Significant PMH includes: lumbar radiculopathy, neurogenic claudication, cevical DDD, hx ACDF C3-4.   Clinical Impression   Pt seen for OT evaluation this date in setting of acute hospitalization s/p elective lumbar decompression. She reports being INDEP with self care at baseline and states that her spouse does the driving. Pt lives with spouse in [place] but it's noted that he works during the day and is not available 24/7. He offers to stay home 24/7 for first week upon pt's d/c from hospital. OT talks to pt and spouse at length about needing supervision for safety as she is a high fall risk currently. Pt requires cues and SUPV to come to EOB sitting using log roll technique. She demos F sitting balance. Pt requires CGA with RW and cues for safety for STS.  Demos G static standing balance and has decreased c/o dizziness upon OT assessment. (BP sitting 101/71, standing 97/66). Pt completes fxl mobility ~15' into hallway requiring CGA initially and then progressively  needing MIN to MOD A as she fatigues as her L LE is noted to be threatening to give way and pt starts to sway.  MOD safety cueing required to convince to pt to head back to room to take seated rest break as she is somewhat impulsive and lacks some insight into her balance deficits. Pt then requesting to use commode. Requires CGA for transfer and SETUP for seated peri care after voiding. Pt left in chair with chair alarm at end of session. All needs met and in reach. Will continue to follow acutely. From OT standpoint: Pt is only safe to d/c home if she has the below-listed equipment and 24/7 supervision as well as HHOT f/u for  safety with ADLs in the natural environment.      Recommendations for follow up therapy are one component of a multi-disciplinary discharge planning process, led by the attending physician.  Recommendations may be updated based on patient status, additional functional criteria and insurance authorization.   Follow Up Recommendations  Home health OT    Assistance Recommended at Discharge Frequent or constant Supervision/Assistance  Functional Status Assessment  Patient has had a recent decline in their functional status and demonstrates the ability to make significant improvements in function in a reasonable and predictable amount of time.  Equipment Recommendations  BSC;Tub/shower seat;Other (comment) (2ww)    Recommendations for Other Services       Precautions / Restrictions Precautions Precautions: Back;Fall Precaution Booklet Issued: Yes (comment) (for pain management) Precaution Comments: high fall Restrictions Weight Bearing Restrictions: No      Mobility Bed Mobility Overal bed mobility: Needs Assistance Bed Mobility: Sidelying to Sit Rolling: Supervision Sidelying to sit: Supervision       General bed mobility comments: cues for log roll technique and use of bedrails    Transfers Overall transfer level: Needs assistance Equipment used: Rolling walker (2 wheels) Transfers: Sit to/from Stand Sit to Stand: Min guard           General transfer comment: cues for safe hand placement/sequence, steadying assist. Decreased c/o dizziness. BP taken in sitting at 101/71, standing 97/66. Does c/o L LE weakness after fxl mobility ~15'      Balance Overall balance assessment: Needs assistance Sitting-balance support: Bilateral   upper extremity supported;Feet supported Sitting balance-Leahy Scale: Good Sitting balance - Comments: G static, F dynamic   Standing balance support: During functional activity;Bilateral upper extremity supported Standing balance-Leahy Scale:  Poor Standing balance comment: P balance with fxl mobility with UE support on RW 2/2 L LE weakness/knee buckling/sway which pt attributes to L LE going numb.                           ADL either performed or assessed with clinical judgement   ADL                                         General ADL Comments: requires SETUP for UB ADLs, MOD A for LB ADLs, CGA for ADL transfers, CGA/MIN A for fxl mobility with RW for safety and balance.     Vision Baseline Vision/History: 1 Wears glasses Ability to See in Adequate Light: 0 Adequate Patient Visual Report: No change from baseline       Perception     Praxis      Pertinent Vitals/Pain Pain Assessment: 0-10 Pain Score: 7  Pain Location: LBP Pain Descriptors / Indicators: Discomfort;Grimacing;Restless Pain Intervention(s): Limited activity within patient's tolerance;Monitored during session;Repositioned     Hand Dominance Right   Extremity/Trunk Assessment Upper Extremity Assessment Upper Extremity Assessment: Overall WFL for tasks assessed;Generalized weakness (MMT not assessed in shoulders d/t sx, wrist, hand, grip grossly 4-/5. ROM WFL. Sensation appropriate and in tact.)   Lower Extremity Assessment Lower Extremity Assessment: Defer to PT evaluation;Overall WFL for tasks assessed;Generalized weakness;LLE deficits/detail (L LE noted to be slighly weaker during functional mobility, knee buckles and threatens to give way. OT has to assist pt to chair for seated rest break and cue pt for safety as she is slightly impulsive)   Cervical / Trunk Assessment Cervical / Trunk Assessment: Normal   Communication Communication Communication: No difficulties   Cognition Arousal/Alertness: Awake/alert Behavior During Therapy: WFL for tasks assessed/performed;Impulsive Overall Cognitive Status: Within Functional Limits for tasks assessed                                 General Comments: pt is  oriented, but demonstrates poor insight into deficits, poor safety awareness. She also is noted to repeat herself some and be somewhat impulsive-tries to stand w/o AD, continues to walk although L LE is clearly fatiguing and giving way and requires cueing to rest.     General Comments  strikethrough of surgical covering    Exercises Other Exercises Other Exercises: OT engages pt in ed re: role of OT, safety ed icluding use of AD, sequence for RW, back precautions, importance of OOB Activity, fall prevention considerations. Pt with moderate reception, but somewhat impulsive and lacks insight into safety awareness and deficits. Her spouse demos good understanding. Other Exercises: Pt and spouse educated re: PT role/POC, DC recommendations, spinal precautions for pain management, log roll, safety with mobility, spinal precaution handout. They verbalized understanding.   Shoulder Instructions      Home Living Family/patient expects to be discharged to:: Private residence Living Arrangements: Spouse/significant other Available Help at Discharge: Family;Available 24 hours/day (spouse works but will be available 24/7 for the first week; after they will have PRN assist from friends/family) Type of Home: House Home Access: Stairs to enter Entrance   Stairs-Number of Steps: 2 STE without railing   Home Layout: One level     Bathroom Shower/Tub: Occupational psychologist: Standard Bathroom Accessibility: Yes   Home Equipment: Conservation officer, nature (2 wheels);Cane - single point;BSC;Tub bench;Hand held shower head;Wheelchair - manual;Toilet riser          Prior Functioning/Environment Prior Level of Function : Independent/Modified Independent             Mobility Comments: Reports being Ind with community ambulation without AD; will use SPC for gait with vertigo flare up ADLs Comments: Reports being Ind with ADL's and splitting IADL's with spouse. Spouse provides transportation.         OT Problem List: Decreased strength;Decreased activity tolerance;Impaired balance (sitting and/or standing);Decreased safety awareness;Decreased knowledge of use of DME or AE      OT Treatment/Interventions: Self-care/ADL training;Therapeutic exercise;DME and/or AE instruction;Therapeutic activities;Patient/family education;Balance training    OT Goals(Current goals can be found in the care plan section) Acute Rehab OT Goals Patient Stated Goal: to go home OT Goal Formulation: With patient Time For Goal Achievement: 06/11/21 Potential to Achieve Goals: Good ADL Goals Pt Will Perform Lower Body Dressing: with supervision;with adaptive equipment;sit to/from stand (grabber to thread, LRAD to stand and perform clothing mgt over hips) Pt Will Transfer to Toilet: with supervision;ambulating;bedside commode (with LRAD ~15" w/ no sway/no LOB to improve tolerance and safety for fxl HH distances.) Pt Will Perform Toileting - Clothing Manipulation and hygiene: with modified independence;sitting/lateral leans Additional ADL Goal #1: Pt will demo improved safety awareness as evidenced by self-initiating seated rest break when L LE is starting to fatigue/threatening to give way w/o cueing.  OT Frequency: Min 2X/week   Barriers to D/C:            Co-evaluation              AM-PAC OT "6 Clicks" Daily Activity     Outcome Measure Help from another person eating meals?: None Help from another person taking care of personal grooming?: A Little Help from another person toileting, which includes using toliet, bedpan, or urinal?: A Little Help from another person bathing (including washing, rinsing, drying)?: A Little Help from another person to put on and taking off regular upper body clothing?: A Little Help from another person to put on and taking off regular lower body clothing?: A Little 6 Click Score: 19   End of Session Equipment Utilized During Treatment: Gait belt;Rolling walker (2  wheels) Nurse Communication: Mobility status  Activity Tolerance: Patient tolerated treatment well Patient left: with call bell/phone within reach;in chair;with chair alarm set;with family/visitor present  OT Visit Diagnosis: Unsteadiness on feet (R26.81);Other abnormalities of gait and mobility (R26.89);History of falling (Z91.81);Muscle weakness (generalized) (M62.81)                Time: 1601-0932 OT Time Calculation (min): 53 min Charges:  OT General Charges $OT Visit: 1 Visit OT Evaluation $OT Eval Moderate Complexity: 1 Mod OT Treatments $Self Care/Home Management : 23-37 mins $Therapeutic Activity: 8-22 mins  Gerrianne Scale, MS, OTR/L ascom [phone] 05/28/21, 12:00 PM

## 2021-05-28 NOTE — TOC Transition Note (Signed)
Transition of Care [facility]) - CM/SW Discharge Note   Patient Details  Name: Tammy Lamb MRN: [id number] Date of Birth: 02/18/1951  Transition of Care [facility]) CM/SW Contact:  Harriet Masson, RN Phone Number:[phone] 05/28/2021, 12:53 PM   Clinical Narrative:    Recommended HHPT-Spoke with pt today concerning discharged disposition with recommended HHPT services. Pt states she has been already set up with [facility] and pending the initial home visit on Monday. RN contacted Drucilla Schmidt [phone] with update. No other needs as PT has verified pt has all her DME along with supportive spouse who will provider the 24/7 care for this pt.   TOC team will remain available to further assist where needed with pt's upcoming discharge needs.   Final next level of care: [place] Barriers to Discharge: Barriers Resolved   Patient Goals and CMS Choice        Discharge Placement                    Patient and family notified of of transfer: 05/28/21 (Spoke with pt and lvm with spouse)  Discharge Plan and Services                          HH Arranged: PT [facility] Agency: [place] Date [place]: 05/28/21 Time [place]: 1252 Representative spoke with at [place]: Left secure vm to [facility] [phone]  Social Determinants of Health (SDOH) Interventions     Readmission Risk Interventions No flowsheet data found.

## 2021-05-28 NOTE — Plan of Care (Signed)
Patient awake most of the night. Minimal pain relief with current pain regimen. Dressing intact but sanguineous drainage noted. Plan of care reviewed with patient.   PLAN OF CARE ONGOING  Problem: Education: Goal: Knowledge of General Education information will improve Description: Including pain rating scale, medication(s)/side effects and non-pharmacologic comfort measures Outcome: Progressing   Problem: Health Behavior/Discharge Planning: Goal: Ability to manage health-related needs will improve Outcome: Progressing   Problem: Clinical Measurements: Goal: Ability to maintain clinical measurements within normal limits will improve Outcome: Progressing Goal: Will remain free from infection Outcome: Progressing Goal: Diagnostic test results will improve Outcome: Progressing Goal: Respiratory complications will improve Outcome: Progressing Goal: Cardiovascular complication will be avoided Outcome: Progressing   Problem: Activity: Goal: Risk for activity intolerance will decrease Outcome: Progressing   Problem: Nutrition: Goal: Adequate nutrition will be maintained Outcome: Progressing   Problem: Coping: Goal: Level of anxiety will decrease Outcome: Progressing   Problem: Elimination: Goal: Will not experience complications related to bowel motility Outcome: Progressing Goal: Will not experience complications related to urinary retention Outcome: Progressing   Problem: Pain Managment: Goal: General experience of comfort will improve Outcome: Progressing   Problem: Safety: Goal: Ability to remain free from injury will improve Outcome: Progressing   Problem: Skin Integrity: Goal: Risk for impaired skin integrity will decrease Outcome: Progressing   Problem: Education: Goal: Ability to verbalize activity precautions or restrictions will improve Outcome: Progressing Goal: Knowledge of the prescribed therapeutic regimen will improve Outcome: Progressing Goal:  Understanding of discharge needs will improve Outcome: Progressing   Problem: Activity: Goal: Ability to avoid complications of mobility impairment will improve Outcome: Progressing Goal: Ability to tolerate increased activity will improve Outcome: Progressing Goal: Will remain free from falls Outcome: Progressing   Problem: Bowel/Gastric: Goal: Gastrointestinal status for postoperative course will improve Outcome: Progressing   Problem: Clinical Measurements: Goal: Ability to maintain clinical measurements within normal limits will improve Outcome: Progressing Goal: Postoperative complications will be avoided or minimized Outcome: Progressing Goal: Diagnostic test results will improve Outcome: Progressing   Problem: Pain Management: Goal: Pain level will decrease Outcome: Progressing   Problem: Skin Integrity: Goal: Will show signs of wound healing Outcome: Progressing   Problem: Health Behavior/Discharge Planning: Goal: Identification of resources available to assist in meeting health care needs will improve Outcome: Progressing   Problem: Bladder/Genitourinary: Goal: Urinary functional status for postoperative course will improve Outcome: Progressing

## 2021-05-28 NOTE — Plan of Care (Incomplete)
Patient Dressing on back is draining underneath dressing - Dr. Informed and assessed at rounds - no dressing changes ordered - asked to leave in place and will change at follow up. May reinforce as needed.

## 2021-05-28 NOTE — Evaluation (Signed)
Physical Therapy Evaluation Patient Details Name: Tammy Lamb MRN: [id number] DOB: 01/31/1951 Today's Date: 05/28/2021  History of Present Illness  Pt admitted to [facility] on 05/27/21 under observation for elective L4-S1 lumbar decompression including central laminectomy and L medial facetectomies including foraminotomies. Significant PMH includes: lumbar radiculopathy, neurogenic claudication, cevical DDD, hx ACDF C3-4.   Clinical Impression  Pt is a 70 year old F admitted to hospital on 05/27/21 for L4-S1 lumbar decompression. At baseline, pt was Ind with ADL's, community ambulation without AD, and splitting IADL's. Pt presents with dizziness with positional changes, poor safety awareness, decreased balance, mild confusion (exacerbated with dizziness), and decreased activity tolerance resulting in impaired functional mobility from baseline. Due to deficits, pt required supervision for bed mobility, min guard for transfers, and min guard to take a few steps at bedside with RW. Further mobility limited secondary to c/o increased dizziness with sit>stand transfers. Pt with hx of vertigo, however, unknown if dizziness is due to vertigo, orthostatic hypotension, anesthesia, etc. Increased multimodal cues for safety required throughout session due to poor safety awareness. Deficits limit the pt's ability to safely and independently perform ADL's, transfer, and ambulate. Pt will benefit from acute skilled PT services to address deficits for return to baseline function. Deficits and participation expected to improve with improvements in dizziness. If dizziness persists, will recommend SNF at DC. If dizziness subsides, will recommend HHPT with initial 24/7 care at DC. Will reassess at PM session. No DME needs.       Recommendations for follow up therapy are one component of a multi-disciplinary discharge planning process, led by the attending physician.  Recommendations may be updated based on patient status,  additional functional criteria and insurance authorization.  Follow Up Recommendations  (SNF if dizziness persists vs. HHPT pending progress; TBD at PM session)    Assistance Recommended at Discharge Intermittent Supervision/Assistance  Functional Status Assessment Patient has had a recent decline in their functional status and demonstrates the ability to make significant improvements in function in a reasonable and predictable amount of time.  Equipment Recommendations  None recommended by PT    Recommendations for Other Services       Precautions / Restrictions Precautions Precautions: Back Precaution Booklet Issued: Yes (comment) (for pain management) Restrictions Weight Bearing Restrictions: No      Mobility  Bed Mobility Overal bed mobility: Needs Assistance Bed Mobility: Rolling;Sidelying to Sit Rolling: Supervision Sidelying to sit: Supervision       General bed mobility comments: supervision for safety to perform log roll to sit EOB; max multimodal cues for safety and sequencing. Increased time/effort.    Transfers Overall transfer level: Needs assistance Equipment used: Rolling walker (2 wheels) Transfers: Sit to/from Stand Sit to Stand: Min guard           General transfer comment: min guard for safety to stand from EOB x2 with RW; verbal cues for safety, sequencing, and hand placement.    Ambulation/Gait Ambulation/Gait assistance: Min guard Gait Distance (Feet): 2 Feet Assistive device: Rolling walker (2 wheels)       General Gait Details: min guard for safety to take a few lateral steps at bedside with RW. Demonstrates slowed cadence, decreased balance, confusion, decreased step length/foot clearance bil, and impaired RW negotiation. Multimodal cues for safety, sequencing, and RW management.    Balance Overall balance assessment: Needs assistance Sitting-balance support: Bilateral upper extremity supported;Feet supported Sitting balance-Leahy  Scale: Fair Sitting balance - Comments: secondary to dizziness; intermittent M/L sway  Standing balance support: During functional activity;Bilateral upper extremity supported Standing balance-Leahy Scale: Poor Standing balance comment: secondary to dizziness; intermittent multidirectional sway requiring min guard for safety                             Pertinent Vitals/Pain Pain Assessment: 0-10 Pain Score: 7  Pain Location: LBP Pain Descriptors / Indicators: Discomfort;Grimacing;Restless Pain Intervention(s): Monitored during session;Repositioned;Premedicated before session    [place] expects to be discharged to:: Private residence Living Arrangements: Spouse/significant other Available Help at Discharge: Family;Available 24 hours/day (spouse works but will be available 24/7 for the first week; after they will have PRN assist from friends/family) Type of Home: House Home Access: Stairs to enter   [employer] of Steps: 2 STE without [place]: One level Home Equipment: Conservation officer, nature (2 wheels);Cane - single point;BSC;Tub bench;Hand held shower head;Wheelchair - manual;Toilet riser      Prior Function Prior Level of Function : Independent/Modified Independent             Mobility Comments: Reports being Ind with community ambulation without AD; will use SPC for gait with vertigo flare up ADLs Comments: Reports being Ind with ADL's and splitting IADL's with spouse. Spouse provides transportation.        Extremity/Trunk Assessment   Upper Extremity Assessment Upper Extremity Assessment: Overall WFL for tasks assessed (not formally assessed due to acuity of sx; observed to be at least 3+/5 as pt was able to [facility] through extremities for transfers. Sensation intact bil.)    Lower Extremity Assessment Lower Extremity Assessment: Overall WFL for tasks assessed (not formally assessed due to acuity of sx; observed to be at  least 3+/5 as pt was able to [facility] through extremities for transfers and gait. Coordination and sensation intact bil.)    Cervical / Trunk Assessment Cervical / Trunk Assessment: Normal  Communication   Communication: No difficulties  Cognition Arousal/Alertness: Awake/alert Behavior During Therapy: WFL for tasks assessed/performed Overall Cognitive Status: Within Functional Limits for tasks assessed                                 General Comments: A&O x4 but seemed confused. Required increased time for processing of task and able to follow simple 2-step commands with multimodal cues.        General Comments General comments (skin integrity, edema, etc.): strikethrough of surgical covering    Exercises Other Exercises Other Exercises: Participated in bed mobility, transfers, and minimal gait at bedside. Further mobility limited secondary to dizziness. Multimodal cues for safety/sequencing. Other Exercises: Pt and spouse educated re: PT role/POC, DC recommendations, spinal precautions for pain management, log roll, safety with mobility, spinal precaution handout. They verbalized understanding.   Assessment/Plan    PT Assessment Patient needs continued PT services  PT Problem List Decreased activity tolerance;Decreased balance;Decreased mobility;Decreased safety awareness;Decreased knowledge of precautions;Pain       PT Treatment Interventions DME instruction;Gait training;Stair training;Functional mobility training;Therapeutic activities;Therapeutic exercise;Balance training;Neuromuscular re-education;Patient/family education    PT Goals (Current goals can be found in the Care Plan section)  Acute Rehab PT Goals Patient Stated Goal: to go home PT Goal Formulation: With patient/family Time For Goal Achievement: 06/11/21 Potential to Achieve Goals: Good    Frequency BID   Barriers to discharge   dizziness       AM-PAC PT "6 Clicks" Mobility  Outcome Measure  Help needed turning from your back to your side while in a flat bed without using bedrails?: A Little Help needed moving from lying on your back to sitting on the side of a flat bed without using bedrails?: A Little Help needed moving to and from a bed to a chair (including a wheelchair)?: A Little Help needed standing up from a chair using your arms (e.g., wheelchair or bedside chair)?: A Little Help needed to walk in hospital room?: A Little Help needed climbing 3-5 steps with a railing? : A Lot 6 Click Score: 17    End of Session Equipment Utilized During Treatment: Gait belt Activity Tolerance: Treatment limited secondary to medical complications (Comment) (dizziness) Patient left: in bed;with call bell/phone within reach;with bed alarm set;with family/visitor present;with SCD's reapplied (purewick donned; HOB elevated, breakfast tray set up) Nurse Communication: Mobility status (dizziness) PT Visit Diagnosis: Unsteadiness on feet (R26.81);Pain Pain - Right/Left:  (back)    Time: 8101-7510 PT Time Calculation (min) (ACUTE ONLY): 35 min   Charges:   PT Evaluation $PT Eval Moderate Complexity: 1 Mod PT Treatments $Therapeutic Activity: 8-22 mins        Herminio Commons, PT, DPT 9:55 AM,05/28/21

## 2021-05-28 NOTE — Progress Notes (Signed)
Physical Therapy Treatment Patient Details Name: Tammy Lamb MRN: [id number] DOB: 1950/08/16 Today's Date: 05/28/2021   History of Present Illness Pt admitted to [facility] on 05/27/21 under observation for elective L4-S1 lumbar decompression including central laminectomy and L medial facetectomies including foraminotomies. Significant PMH includes: lumbar radiculopathy, neurogenic claudication, cevical DDD, hx ACDF C3-4.    PT Comments    Pt tolerated treatment well this afternoon, and was able to improve overall assist levels, ambulation distance, pain levels, and balance. She required supervision-CGA for transfers and CGA for gait with RW. Able to ambulate 22f total without knee buckling. Increased fatigue noted after x10 STS from recliner with RW. Despite progress, pt continues to be limited with meeting goals due to poor safety awareness, impaired insight into deficits, decreased standing balance, and decreased activity tolerance. Per conversation with OT and RN, poor insight into deficits and poor safety awareness seem to be pt's baseline. Skin tear noted with L medial thigh with strikethrough on surgical covering; RN notified. Increased time/effort spend educating pt regarding safety at home including: safety with transfers (hand placement), removing trip hazards, wearing grip socks/shoes with mobility, using RW, taking time with positional changes to reduce risk of dizziness, and activity modification. She verbalized understanding. Pt will continue to benefit from skilled acute PT services to address deficits for return to baseline function. Will recommend DC home with HHPT and initial 24/7 care. No DME needs, but will recommend use of RW, tub bench, and BSC at bed (for night) for safety. She verbalized understanding.     Recommendations for follow up therapy are one component of a multi-disciplinary discharge planning process, led by the attending physician.  Recommendations may be updated based on  patient status, additional functional criteria and insurance authorization.  Follow Up Recommendations  Home health PT     Assistance Recommended at Discharge Frequent or constant Supervision/Assistance  Equipment Recommendations  None recommended by PT (use of RW, tub bench, and BSC (next to bed) at DC)    Recommendations for Other Services       Precautions / Restrictions Precautions Precautions: Back;Fall Precaution Booklet Issued: Yes (comment) (for pain management) Precaution Comments: high fall Restrictions Weight Bearing Restrictions: No     Mobility  Bed Mobility Overal bed mobility: Needs Assistance Bed Mobility: Sidelying to Sit Rolling: Supervision Sidelying to sit: Supervision       General bed mobility comments: seated in recliner upon entry    Transfers Overall transfer level: Needs assistance Equipment used: Rolling walker (2 wheels) Transfers: Sit to/from Stand Sit to Stand: Supervision;Min guard           General transfer comment: initially CGA for safety, progressing to supervision. verbal cues for safety, sequencing, and hand placement. Increased time/effort for standing.    Ambulation/Gait Ambulation/Gait assistance: Min guard Gait Distance (Feet): 80 Feet ([id number], [id number] (recliner<>BSC)) Assistive device: Rolling walker (2 wheels)       General Gait Details: CGA for safety due to poor safety awareness and hx of knee buckling. Demonstrates early reciprocal gait, decreased step length/foot clearance bil, decreased RW proximity/negotiation, and discontinuous steps with turns. Verbal cues for RW proximity/mgmt esp with turns.     Balance Overall balance assessment: Needs assistance Sitting-balance support: Bilateral upper extremity supported;Feet supported Sitting balance-Leahy Scale: Good Sitting balance - Comments: G static, F dynamic   Standing balance support: During functional activity;Bilateral upper extremity supported Standing  balance-Leahy Scale: Poor Standing balance comment: grossly fair standing balance with BUE support on  RW; increased unsteadiness during transfers with BUE on RW instead of UE on RW/chair. verbal cues for safety and hand placement with transfers                            Cognition Arousal/Alertness: Awake/alert Behavior During Therapy: [facility] for tasks assessed/performed;Impulsive Overall Cognitive Status: Within Functional Limits for tasks assessed                                 General Comments: alert and oriented, improved from this AM. Continues to present with poor insight into deficits and poor safety awareness (seems to be baseline)        Exercises Other Exercises Other Exercises: Participated in transfers, gait, and toileting with RW. Grossly supervision-CGA for safety due to poor safety awareness. Multimodal cues for safety and sequencing. Able to perform x10 STS transfers from recliner with RW, noting increased fatigue post session. Other Exercises: Pt educated re: PT role/POC, DC recommendations, spinal precautions for pain management, safety with mobility (hand placement with transfers), spinal precaution handout, call for help/don't get up, removing trip hazards/safety at home. She verbalized understanding.    General Comments General comments (skin integrity, edema, etc.): strikethrough of surgical covering; skin tear to L medial thigh; RN notified      Pertinent Vitals/Pain Pain Assessment: 0-10 Pain Score: 5  Pain Location: LBP Pain Descriptors / Indicators: Discomfort;Grimacing;Restless Pain Intervention(s): Monitored during session;Repositioned    Home Living Family/patient expects to be discharged to:: Private residence Living Arrangements: Spouse/significant other Available Help at Discharge: Family;Available 24 hours/day (spouse works but will be available 24/7 for the first week; after they will have PRN assist from friends/family) Type  of Home: House Home Access: Stairs to enter   [employer] of Steps: 2 STE without [place]: One level Home Equipment: Conservation officer, nature (2 wheels);Cane - single point;BSC;Tub bench;Hand held shower head;Wheelchair - manual;Toilet riser          PT Goals (current goals can now be found in the care plan section) Acute Rehab PT Goals Patient Stated Goal: to go home PT Goal Formulation: With patient/family Time For Goal Achievement: 06/11/21 Potential to Achieve Goals: Good Progress towards PT goals: Progressing toward goals    Frequency    BID      PT Plan Current plan remains appropriate       AM-PAC PT "6 Clicks" Mobility   Outcome Measure  Help needed turning from your back to your side while in a flat bed without using bedrails?: A Little Help needed moving from lying on your back to sitting on the side of a flat bed without using bedrails?: A Little Help needed moving to and from a bed to a chair (including a wheelchair)?: A Little Help needed standing up from a chair using your arms (e.g., wheelchair or bedside chair)?: A Little Help needed to walk in hospital room?: A Little Help needed climbing 3-5 steps with a railing? : A Lot 6 Click Score: 17    End of Session Equipment Utilized During Treatment: Gait belt Activity Tolerance: Patient tolerated treatment well Patient left: in chair;with call bell/phone within reach;with chair alarm set Nurse Communication: Mobility status PT Visit Diagnosis: Unsteadiness on feet (R26.81);Pain Pain - Right/Left:  (back)     Time: 8185-6314 PT Time Calculation (min) (ACUTE ONLY): 23 min  Charges:  $Therapeutic Activity: 23-37  mins                      Herminio Commons, PT, DPT 12:52 PM,05/28/21

## 2021-05-28 NOTE — Progress Notes (Signed)
    Attending Progress Note  History: Tammy Lamb is here for lumbar radiculopathy.  She is doing well.  POD1: Had pain overnight, but doing better this AM.  L leg feels better.  Physical Exam: Vitals:   05/28/21 0645 05/28/21 0738  BP: 121/63 109/67  Pulse: 75 82  Resp:  16  Temp:  99.1 F (37.3 C)  SpO2: 91% 94%    AA Ox3 CNI  Strength:5/5 throughout BLE  Incision with come drainage but inside the dressing.  Data:  Recent Labs  Lab 05/23/21 1359  NA 133*  K 3.9  CL 95*  CO2 29  BUN 9  CREATININE 0.73  GLUCOSE 72  CALCIUM 9.0   No results for input(s): AST, ALT, ALKPHOS in the last 168 hours.  Invalid input(s): TBILI   Recent Labs  Lab 05/23/21 1359  WBC 7.9  HGB 12.5  HCT 37.9  PLT 434*   Recent Labs  Lab 05/23/21 1359  APTT 34  INR 1.0         Other tests/results: none  Assessment/Plan:  Tammy Lamb is doing well on POD1.  - mobilize - pain control - changed regimen this AM - DVT prophylaxis - PTOT eval this AM   Meade Maw MD, [facility] Department of Neurosurgery

## 2021-06-23 ENCOUNTER — Telehealth: Payer: Self-pay | Admitting: Student in an Organized Health Care Education/Training Program

## 2021-06-23 NOTE — Telephone Encounter (Signed)
Patient called to refill medications and pharmacy states she does not have a refill. She had surgery recently and doubled up on meds instead of getting script filled from dr Izora Ribas. She has appt on 12-15 which I changed to VV, she is not released to travel yet. Please call patient to advise. Says this happens every 3rd script refill. Something gets mixed up. Not sure what it is.

## 2021-06-27 NOTE — Telephone Encounter (Signed)
Patient has a script on file from Dr. Holley Raring but the pharmacy will not let her fill it until 06/29/21 because Dr. Cari Caraway gave her a 7 day supply on 06/23/21. Her F2F was rescheduled to a VV due to her recent surgery. Scripts confirmed with pharmacy.

## 2021-06-29 ENCOUNTER — Telehealth: Payer: Self-pay | Admitting: Student in an Organized Health Care Education/Training Program

## 2021-06-29 NOTE — Telephone Encounter (Signed)
Pharmacy called. They have the script. Patient called and informed that she could pick up later today.

## 2021-06-29 NOTE — Telephone Encounter (Signed)
Patient called pharmacy to get scripts and was told they do not have any scripts. Please call pharmacy and let patient know status.

## 2021-07-07 ENCOUNTER — Ambulatory Visit
Payer: Medicare Other | Attending: Student in an Organized Health Care Education/Training Program | Admitting: Student in an Organized Health Care Education/Training Program

## 2021-07-07 ENCOUNTER — Encounter: Payer: Self-pay | Admitting: Student in an Organized Health Care Education/Training Program

## 2021-07-07 ENCOUNTER — Other Ambulatory Visit: Payer: Self-pay

## 2021-07-07 DIAGNOSIS — M5481 Occipital neuralgia: Secondary | ICD-10-CM | POA: Diagnosis not present

## 2021-07-07 DIAGNOSIS — M542 Cervicalgia: Secondary | ICD-10-CM

## 2021-07-07 DIAGNOSIS — Z981 Arthrodesis status: Secondary | ICD-10-CM

## 2021-07-07 DIAGNOSIS — M5412 Radiculopathy, cervical region: Secondary | ICD-10-CM | POA: Diagnosis not present

## 2021-07-07 DIAGNOSIS — G894 Chronic pain syndrome: Secondary | ICD-10-CM

## 2021-07-07 DIAGNOSIS — G8929 Other chronic pain: Secondary | ICD-10-CM

## 2021-07-07 DIAGNOSIS — M48062 Spinal stenosis, lumbar region with neurogenic claudication: Secondary | ICD-10-CM

## 2021-07-07 DIAGNOSIS — M5416 Radiculopathy, lumbar region: Secondary | ICD-10-CM | POA: Diagnosis not present

## 2021-07-07 DIAGNOSIS — M4692 Unspecified inflammatory spondylopathy, cervical region: Secondary | ICD-10-CM

## 2021-07-07 MED ORDER — HYDROCODONE-ACETAMINOPHEN 10-325 MG PO TABS: 1.0000 | ORAL_TABLET | Freq: Four times a day (QID) | ORAL | 0 refills | Status: DC | PRN

## 2021-07-07 MED ORDER — HYDROCODONE-ACETAMINOPHEN 10-325 MG PO TABS: 1.0000 | ORAL_TABLET | Freq: Three times a day (TID) | ORAL | 0 refills | Status: DC | PRN

## 2021-07-07 NOTE — Progress Notes (Signed)
Patient: Tammy Lamb  Service Category: E/M  Provider: Gillis Santa, MD  DOB: [birth year]  DOS: 07/07/2021  Location: Office  MRN: [id number]  Setting: Ambulatory outpatient  Referring Provider: Gladstone Lighter, MD  Type: Established Patient  Specialty: Interventional Pain Management  PCP: Gladstone Lighter, MD  Location: Home  Delivery: TeleHealth     Virtual Encounter - Pain Management PROVIDER NOTE: Information contained herein reflects review and annotations entered in association with encounter. Interpretation of such information and data should be left to medically-trained personnel. Information provided to patient can be located elsewhere in the medical record under "Patient Instructions". Document created using STT-dictation technology, any transcriptional errors that may result from process are unintentional.    Contact & Pharmacy Preferred: [phone] Home: [phone] (home) Mobile: [phone] (mobile) E-mail: [email or web address]  [place], [place] [address] [place] [address] Phone: [phone] Fax: [phone]  [id number] [place], [id number]: [phone]: [phone]  Pre-screening  Tammy Lamb offered "in-person" vs "virtual" encounter. She indicated preferring virtual for this encounter.   Reason COVID-19*   Social distancing based on CDC and AMA recommendations.   I contacted Tammy Lamb on 07/07/2021 via telephone.      I clearly identified myself as BGillis Santa MD. I verified that I was speaking with the correct person using two identifiers (Name: Tammy Lamb and date of birth: 228-Feb-1952.  Consent I sought verbal advanced consent from [place] interactions. I informed Tammy Lamb of possible security and privacy concerns, risks, and limitations associated with providing  "not-in-person" medical evaluation and management services. I also informed Tammy Lamb of the availability of "in-person" appointments. Finally, I informed her that there would be a charge for the virtual visit and that she could be  personally, fully or partially, financially responsible for it. Tammy Lamb understanding and agreed to proceed.   Historic Elements   Tammy Lamb is a 70y.o. year old, female patient evaluated today after our last contact on 06/29/2021. Tammy Lamb has a past medical history of Anemia, Anxiety, Arthritis, Bilateral carotid artery stenosis, Chronic lower back pain, Colitis, ulcerative ([place], COPD (chronic obstructive pulmonary disease) ([place], Depression, Diabetes mellitus without complication ([place], GERD (gastroesophageal reflux disease), H/O bladder infections, History of 2019 novel coronavirus disease (COVID-19) (02/2021), History of hiatal hernia, HLD (hyperlipidemia), Hypertension, Interstitial cystitis, Low sodium levels, Motion sickness, Parkinson's disease ([place], PVD (peripheral vascular disease) ([place], Sinus headache, Stroke ([place] (2015), TIA (transient ischemic attack) (2015), Urinary frequency, and Wears dentures. She also  has a past surgical history that includes Tonsillectomy; Cholecystectomy; Abdominal hysterectomy; Diagnostic laparoscopy; Bladder suspension; Colonoscopy; Upper gi endoscopy; Knee arthroscopy (Left, 02/13/2014); Ankle arthroscopy (Left, 03/22/2016); Tendon repair (Left, 03/22/2016); Lumbar laminectomy/decompression microdiscectomy (N/A, 05/30/2017); Anterior cervical decomp/discectomy fusion (N/A, 04/09/2019); and Lumbar laminectomy/decompression microdiscectomy (N/A, 05/27/2021). Tammy Lamb a current medication list which includes the following prescription(s): acetaminophen, amitriptyline, amlodipine, ascorbic acid, atorvastatin, celecoxib, cephalexin, cholecalciferol, cinnamon, citalopram, fluticasone-salmeterol, gabapentin,  hydrochlorothiazide, hydroxyzine, magnesium oxide, metformin, omeprazole-sodium bicarbonate, oxybutynin, pentosan polysulfate sodium, polyethylene glycol, sulfasalazine, tizanidine, valsartan, ventolin hfa, vitamin b-12, [START ON 07/11/2021] hydrocodone-acetaminophen, and [START ON 08/06/2021] hydrocodone-acetaminophen. She  reports that she has been smoking cigarettes. She has a 24.50 pack-year smoking history. She has never used smokeless tobacco. She reports that she does not currently use alcohol. She reports that  she does not use drugs. Tammy Lamb is allergic to aspirin and sulfa antibiotics.   HPI  Today, she is being contacted for medication management.  S/p L4-S1 decompression with Dr Cari Caraway 05/27/21, states that after her surgery her 58 year old dog became sick and she ended up picking her up and doing more activity than she was supposed to Working with home PT Having persistent leg pain and states that she is having to utilize more of her hydrocodone.   Pharmacotherapy Assessment   Opioid Analgesic: Hydrocodone 70m BID prn 60/month (chronic pain regimen prior to surgery)     Monitoring: [place] PMP: PDMP not reviewed this encounter.       Pharmacotherapy: No side-effects or adverse reactions reported. Compliance: No problems identified. Effectiveness: Clinically acceptable. Plan: Refer to "POC". UDS:  Summary  Date Value Ref Range Status  09/16/2020 Note  Final    Comment:    ==================================================================== [employer] Select 13 (MW) ==================================================================== Test                             Result       Flag       Units  Drug Present and Declared for Prescription Verification   Hydrocodone                    378          EXPECTED   ng/mg creat   Norhydrocodone                 1419         EXPECTED   ng/mg creat    Sources of hydrocodone include scheduled prescription medications.    Norhydrocodone  is an expected metabolite of hydrocodone.  ==================================================================== Test                      Result    Flag   Units      Ref Range   Creatinine              36               mg/dL      >=20 ==================================================================== Declared Medications:  The flagging and interpretation on this report are based on the  following declared medications.  Unexpected results may arise from  inaccuracies in the declared medications.   **Note: The testing scope of this panel includes these medications:   Hydrocodone   **Note: The testing scope of this panel does not include the  following reported medications:   Acetaminophen  Albuterol  Amlodipine  Atorvastatin  Cephalexin  Cinnamon  Citalopram  Gabapentin  Hydrochlorothiazide  Hydroxyzine  Magnesium (Mag-Ox)  Metformin  Omeprazole  Pentosan ([place])  Ramipril  Sodium Bicarbonate  Sulfasalazine  Tizanidine  Vitamin B12  Vitamin C  Vitamin D3 ==================================================================== For clinical consultation, please call [phone] ====================================================================      Laboratory Chemistry Profile   Renal Lab Results  Component Value Date   BUN 9 05/23/2021   CREATININE 0.73 05/23/2021   GFRAA >60 04/02/2019   GFRNONAA >60 05/23/2021    Hepatic No results found for: AST, ALT, ALBUMIN, ALKPHOS, HCVAB, AMYLASE, LIPASE, AMMONIA  Electrolytes Lab Results  Component Value Date   NA 133 (L) 05/23/2021   K 3.9 05/23/2021   CL 95 (L) 05/23/2021   CALCIUM 9.0 05/23/2021    Bone No results found for: [place] [id number]  25OHVITD1, 25OHVITD2, 25OHVITD3, TESTOFREE, TESTOSTERONE  Inflammation (CRP: Acute Phase) (ESR: Chronic Phase) No results found for: CRP, ESRSEDRATE, LATICACIDVEN       Note: Above Lab results reviewed.  Imaging  DG Lumbar Spine 2-3  Views CLINICAL DATA:  L4-S1 decompression and micro discectomy.  EXAM: LUMBAR SPINE - 2-3 VIEW  COMPARISON:  None.  FINDINGS: Spinal labeling is based on the presumption of 5 non rib-bearing lumbar type vertebral bodies.  Provided images demonstrate intraoperative localization of L4-L5 and L5-S1.  Additional CT spin images are also provided with reformatted sagittal and coronal images demonstrating surgical support apparatus posterior to the L5-S1 intervertebral disc space.  IMPRESSION: Intraoperative localization of L4-L5 and L5-S1.  Electronically Signed   By: Sandi Mariscal M.D.   On: 05/27/2021 14:18 DG C-Arm 1-60 Min CLINICAL DATA:  Surgery at L4-S1 levels  EXAM: DG C-ARM 1-60 MIN  FLUOROSCOPY TIME:  Fluoroscopy Time:  43 seconds  Radiation Exposure Index (if provided by the fluoroscopic device): Not provided  Number of Acquired Spot Images: 9  COMPARISON:  None.  FINDINGS: Fluoroscopic images show surgery in the lower lumbar spine.  IMPRESSION: Fluoroscopic assistance was provided for lumbar spine surgery.  Electronically Signed   By: Elmer Picker M.D.   On: 05/27/2021 13:41  Assessment  The primary encounter diagnosis was Chronic pain syndrome. Diagnoses of Chronic radicular cervical pain, Lumbar radiculopathy, Bilateral occipital neuralgia, S/P cervical spinal fusion (C3-C4 ACDF (03/2019), Spinal stenosis, lumbar region, with neurogenic claudication, Cervicalgia, and Inflammatory spondylopathy of cervical region [facility]) were also pertinent to this visit.  Plan of Care    Tammy Lamb has a current medication list which includes the following long-term medication(s): amlodipine, atorvastatin, fluticasone-salmeterol, gabapentin, hydrochlorothiazide, metformin, sulfasalazine, tizanidine, valsartan, [START ON 07/11/2021] hydrocodone-acetaminophen, and [START ON 08/06/2021] hydrocodone-acetaminophen.  Pharmacotherapy (Medications Ordered): Meds  ordered this encounter  Medications   HYDROcodone-acetaminophen (NORCO) 10-325 MG tablet    Sig: Take 1 tablet by mouth every 6 (six) hours as needed for severe pain.    Dispense:  120 tablet    Refill:  0   HYDROcodone-acetaminophen (NORCO) 10-325 MG tablet    Sig: Take 1 tablet by mouth every 8 (eight) hours as needed for severe pain.    Dispense:  90 tablet    Refill:  0  Continue other multimodal analgesics as prescribed Keep follow-up with Dr. Cari Caraway Be mindful of activity limitations    Follow-up plan:   Return in about 8 weeks (around 09/01/2021) for Medication Management, in person.    Recent Visits Date Type Provider Dept  04/12/21 Office Visit Gillis Santa, MD [facility]  Showing recent visits within past 90 days and meeting all other requirements Today's Visits Date Type Provider Dept  07/07/21 Office Visit Gillis Santa, MD [facility]  Showing today's visits and meeting all other requirements Future Appointments No visits were found meeting these conditions. Showing future appointments within next 90 days and meeting all other requirements I discussed the assessment and treatment plan with the patient. The patient was provided an opportunity to ask questions and all were answered. The patient agreed with the plan and demonstrated an understanding of the instructions.  Patient advised to call back or seek an in-person evaluation if the symptoms or condition worsens.  Duration of encounter: 74mnutes.  Note by: BGillis Santa MD Date: 07/07/2021; Time: 11:02 AM

## 2021-07-12 ENCOUNTER — Encounter: Payer: Medicare Other | Admitting: Student in an Organized Health Care Education/Training Program

## 2021-07-13 ENCOUNTER — Telehealth: Payer: Self-pay

## 2021-07-13 DIAGNOSIS — M5416 Radiculopathy, lumbar region: Secondary | ICD-10-CM

## 2021-07-13 NOTE — Telephone Encounter (Signed)
This patient called and left a vm stating that Dr. Izora Ribas told her that he was going to recommend to Dr. Holley Raring a procedure and that she could have it done before Christmas. I'm not sure what she is talking about, does anyone know what this is about?

## 2021-07-14 ENCOUNTER — Telehealth: Payer: Self-pay | Admitting: *Deleted

## 2021-07-14 NOTE — Telephone Encounter (Signed)
Patient called and notified.  She was given pre procedure instrucitons.

## 2021-07-14 NOTE — Telephone Encounter (Signed)
Pharmacy called to verify Rx's having different sigs.  I did check with BL and they are correct as ordered.  First is every 6 hours and then January reduces to every 8 hours.

## 2021-08-01 ENCOUNTER — Encounter: Payer: Self-pay | Admitting: Student in an Organized Health Care Education/Training Program

## 2021-08-01 ENCOUNTER — Ambulatory Visit
Admission: RE | Admit: 2021-08-01 | Discharge: 2021-08-01 | Disposition: A | Discharge status: Home / Self Care | Payer: Medicare Other | Source: Ambulatory Visit | Attending: Student in an Organized Health Care Education/Training Program | Admitting: Student in an Organized Health Care Education/Training Program

## 2021-08-01 ENCOUNTER — Other Ambulatory Visit: Payer: Self-pay

## 2021-08-01 ENCOUNTER — Ambulatory Visit (HOSPITAL_BASED_OUTPATIENT_CLINIC_OR_DEPARTMENT_OTHER): Payer: Medicare Other | Admitting: Student in an Organized Health Care Education/Training Program

## 2021-08-01 VITALS — BP 131/72 | HR 69 | Temp 96.8°F | Resp 16 | Ht 66.0 in | Wt 178.0 lb

## 2021-08-01 DIAGNOSIS — M5416 Radiculopathy, lumbar region: Secondary | ICD-10-CM

## 2021-08-01 DIAGNOSIS — G894 Chronic pain syndrome: Secondary | ICD-10-CM | POA: Diagnosis present

## 2021-08-01 MED ORDER — SODIUM CHLORIDE 0.9% FLUSH
1.0000 mL | Freq: Once | INTRAVENOUS | Status: AC
  Administered 2021-08-01: 1 mL

## 2021-08-01 MED ORDER — LIDOCAINE HCL 2 % IJ SOLN
20.0000 mL | Freq: Once | INTRAMUSCULAR | Status: AC
  Administered 2021-08-01: 400 mg
  Filled 2021-08-01: qty 20

## 2021-08-01 MED ORDER — ROPIVACAINE HCL 2 MG/ML IJ SOLN
1.0000 mL | Freq: Once | INTRAMUSCULAR | Status: AC
  Administered 2021-08-01: 1 mL via EPIDURAL

## 2021-08-01 MED ORDER — SODIUM CHLORIDE (PF) 0.9 % IJ SOLN
INTRAMUSCULAR | Status: AC
  Filled 2021-08-01: qty 10

## 2021-08-01 MED ORDER — DEXAMETHASONE SODIUM PHOSPHATE 10 MG/ML IJ SOLN
10.0000 mg | Freq: Once | INTRAMUSCULAR | Status: AC
  Administered 2021-08-01: 10 mg
  Filled 2021-08-01: qty 1

## 2021-08-01 MED ORDER — IOHEXOL 180 MG/ML  SOLN
10.0000 mL | Freq: Once | INTRAMUSCULAR | Status: AC
  Administered 2021-08-01: 10 mL via EPIDURAL

## 2021-08-01 MED ORDER — ROPIVACAINE HCL 2 MG/ML IJ SOLN
INTRAMUSCULAR | Status: AC
  Filled 2021-08-01: qty 20

## 2021-08-01 NOTE — Patient Instructions (Signed)

## 2021-08-01 NOTE — Progress Notes (Signed)
Safety precautions to be maintained throughout the outpatient stay will include: orient to surroundings, keep bed in low position, maintain call bell within reach at all times, provide assistance with transfer out of bed and ambulation.  

## 2021-08-01 NOTE — Progress Notes (Signed)
PROVIDER NOTE: Interpretation of information contained herein should be left to medically-trained personnel. Specific patient instructions are provided elsewhere under "Patient Instructions" section of medical record. This document was created in part using STT-dictation technology, any transcriptional errors that may result from this process are unintentional.  Patient: Tammy Lamb Type: Established DOB: 01-30-1951 MRN: [id number] PCP: Gladstone Lighter, MD  Service: Procedure DOS: 08/01/2021 Setting: Ambulatory Location: Ambulatory outpatient facility Delivery: Face-to-face Provider: Gillis Santa, MD Specialty: Interventional Pain Management Specialty designation: 09 Location: Outpatient facility Ref. Prov.: Gladstone Lighter, MD    Primary Reason for Visit: Interventional Pain Management Treatment. CC: Back Pain and Hip Pain    Procedure:          Anesthesia, Analgesia, Anxiolysis:  Type: Therapeutic Epidural Steroid Injection          Region: Caudal Level: Sacrococcygeal   Laterality: Midline aiming at the left  Anesthesia: Local (1-2% Lidocaine)  Anxiolysis: None  Sedation: None  Guidance: Fluoroscopy           Position: Prone   1. Lumbar radiculopathy   2. Chronic pain syndrome    NAS-11 Pain score:   Pre-procedure: 6 /10   Post-procedure: 6 /10     Pre-op H&P Assessment:  Tammy Lamb is a 71 y.o. (year old), female patient, seen today for interventional treatment. She  has a past surgical history that includes Tonsillectomy; Cholecystectomy; Abdominal hysterectomy; Diagnostic laparoscopy; Bladder suspension; Colonoscopy; Upper gi endoscopy; Knee arthroscopy (Left, 02/13/2014); Ankle arthroscopy (Left, 03/22/2016); Tendon repair (Left, 03/22/2016); Lumbar laminectomy/decompression microdiscectomy (N/A, 05/30/2017); Anterior cervical decomp/discectomy fusion (N/A, 04/09/2019); and Lumbar laminectomy/decompression microdiscectomy (N/A, 05/27/2021). Tammy Lamb has a current  medication list which includes the following prescription(s): acetaminophen, amitriptyline, amlodipine, ascorbic acid, atorvastatin, celecoxib, cephalexin, cholecalciferol, cinnamon, citalopram, fluticasone-salmeterol, gabapentin, hydrochlorothiazide, hydrocodone-acetaminophen, [START ON 08/06/2021] hydrocodone-acetaminophen, hydroxyzine, magnesium oxide, metformin, omeprazole-sodium bicarbonate, oxybutynin, pentosan polysulfate sodium, polyethylene glycol, sulfasalazine, tizanidine, valsartan, ventolin hfa, and vitamin b-12. Her primarily concern today is the Back Pain and Hip Pain  Initial Vital Signs:  Pulse/HCG Rate: 70  Temp:  (!) 96.8 F (36 C) Resp: 19 BP:  (!) 136/93 SpO2: 100 %  BMI: Estimated body mass index is 28.73 kg/m as calculated from the following:   Height as of this encounter: 5' 6"  (1.676 m).   Weight as of this encounter: 178 lb (80.7 kg).  Risk Assessment: Allergies: Reviewed. She is allergic to aspirin and sulfa antibiotics.  Allergy Precautions: None required Coagulopathies: Reviewed. None identified.  Blood-thinner therapy: None at this time Active Infection(s): Reviewed. None identified. Tammy Lamb is afebrile  Site Confirmation: Tammy Lamb was asked to confirm the procedure and laterality before marking the site Procedure checklist: Completed Consent: Before the procedure and under the influence of no sedative(s), amnesic(s), or anxiolytics, the patient was informed of the treatment options, risks and possible complications. To fulfill our ethical and legal obligations, as recommended by the American Medical Association's Code of Ethics, I have informed the patient of my clinical impression; the nature and purpose of the treatment or procedure; the risks, benefits, and possible complications of the intervention; the alternatives, including doing nothing; the risk(s) and benefit(s) of the alternative treatment(s) or procedure(s); and the risk(s) and benefit(s) of  doing nothing. The patient was provided information about the general risks and possible complications associated with the procedure. These may include, but are not limited to: failure to achieve desired goals, infection, bleeding, organ or nerve damage, allergic reactions, paralysis, and death. In addition, the patient was  informed of those risks and complications associated to Spine-related procedures, such as failure to decrease pain; infection (i.e.: Meningitis, epidural or intraspinal abscess); bleeding (i.e.: epidural hematoma, subarachnoid hemorrhage, or any other type of intraspinal or peri-dural bleeding); organ or nerve damage (i.e.: Any type of peripheral nerve, nerve root, or spinal cord injury) with subsequent damage to sensory, motor, and/or autonomic systems, resulting in permanent pain, numbness, and/or weakness of one or several areas of the body; allergic reactions; (i.e.: anaphylactic reaction); and/or death. Furthermore, the patient was informed of those risks and complications associated with the medications. These include, but are not limited to: allergic reactions (i.e.: anaphylactic or anaphylactoid reaction(s)); adrenal axis suppression; blood sugar elevation that in diabetics may result in ketoacidosis or comma; water retention that in patients with history of congestive heart failure may result in shortness of breath, pulmonary edema, and decompensation with resultant heart failure; weight gain; swelling or edema; medication-induced neural toxicity; particulate matter embolism and blood vessel occlusion with resultant organ, and/or nervous system infarction; and/or aseptic necrosis of one or more joints. Finally, the patient was informed that Medicine is not an exact science; therefore, there is also the possibility of unforeseen or unpredictable risks and/or possible complications that may result in a catastrophic outcome. The patient indicated having understood very clearly. We have  given the patient no guarantees and we have made no promises. Enough time was given to the patient to ask questions, all of which were answered to the patient's satisfaction. Tammy Lamb has indicated that she wanted to continue with the procedure. Attestation: I, the ordering provider, attest that I have discussed with the patient the benefits, risks, side-effects, alternatives, likelihood of achieving goals, and potential problems during recovery for the procedure that I have provided informed consent. Date   Time: 08/01/2021  1:52 PM  Pre-Procedure Preparation:  Monitoring: As per clinic protocol. Respiration, ETCO2, SpO2, BP, heart rate and rhythm monitor placed and checked for adequate function Safety Precautions: Patient was assessed for positional comfort and pressure points before starting the procedure. Time-out: I initiated and conducted the "Time-out" before starting the procedure, as per protocol. The patient was asked to participate by confirming the accuracy of the "Time Out" information. Verification of the correct person, site, and procedure were performed and confirmed by me, the nursing staff, and the patient. "Time-out" conducted as per Joint Commission's Universal Protocol (UP.01.01.01). Time: 1410  Description of Procedure:          Target Area: Caudal Epidural Canal. Approach: Midline approach. Area Prepped: Entire Posterior Sacrococcygeal Region DuraPrep (Iodine Povacrylex [0.7% available iodine] and Isopropyl Alcohol, 74% w/w) Safety Precautions: Aspiration looking for blood return was conducted prior to all injections. At no point did we inject any substances, as a needle was being advanced. No attempts were made at seeking any paresthesias. Safe injection practices and needle disposal techniques used. Medications properly checked for expiration dates. SDV (single dose vial) medications used. Description of the Procedure: Protocol guidelines were followed. The patient was placed  in position over the fluoroscopy table. The target area was identified and the area prepped in the usual manner. Skin & deeper tissues infiltrated with local anesthetic. Appropriate amount of time allowed to pass for local anesthetics to take effect. The procedure needles were then advanced to the target area. Proper needle placement secured. Negative aspiration confirmed. Solution injected in intermittent fashion, asking for systemic symptoms every 0.5cc of injectate. The needles were then removed and the area cleansed, making sure to leave  some of the prepping solution back to take advantage of its long term bactericidal properties. Vitals:   08/01/21 1355 08/01/21 1356 08/01/21 1409 08/01/21 1417  BP:  (!) 136/93 128/71 131/72  Pulse:  70 67 69  Resp:  19 18 16   Temp: (!) 96.8 F (36 C)     SpO2:  100% 100% 100%  Weight: 178 lb (80.7 kg)     Height: 5' 6"  (1.676 m)       Start Time: 1411 hrs. End Time: 1415 hrs. Materials:  Tray: Epidural Needle(s) Type: Epidural needle Gauge: 22G Length: 3.5-in Qty: 1  5 cc solution made of 2cc of preservative-free saline, 2 cc of 0.2% ropivacaine, 1 cc of Decadron 10 mg/cc.  Imaging Guidance (Spinal):          Type of Imaging Technique: Fluoroscopy Guidance (Spinal) Indication(s): Assistance in needle guidance and placement for procedures requiring needle placement in or near specific anatomical locations not easily accessible without such assistance. Exposure Time: Please see nurses notes. Contrast: Before injecting any contrast, we confirmed that the patient did not have an allergy to iodine, shellfish, or radiological contrast. Once satisfactory needle placement was completed at the desired level, radiological contrast was injected. Contrast injected under live fluoroscopy. No contrast complications. See chart for type and volume of contrast used. Fluoroscopic Guidance: I was personally present during the use of fluoroscopy. "Tunnel Vision  Technique" used to obtain the best possible view of the target area. Parallax error corrected before commencing the procedure. "Direction-depth-direction" technique used to introduce the needle under continuous pulsed fluoroscopy. Once target was reached, antero-posterior, oblique, and lateral fluoroscopic projection used confirm needle placement in all planes. Images permanently stored in EMR. Interpretation: I personally interpreted the imaging intraoperatively. Adequate needle placement confirmed in multiple planes. Appropriate spread of contrast into desired area was observed. No evidence of afferent or efferent intravascular uptake. No intrathecal or subarachnoid spread observed. Permanent images saved into the patient's record.   Post-operative Assessment:  Post-procedure Vital Signs:  Pulse/HCG Rate: 69 (Nsr)  Temp:  (!) 96.8 F (36 C) Resp: 16 BP:  131/72 SpO2: 100 %  EBL: None  Complications: No immediate post-treatment complications observed by team, or reported by patient.  Note: The patient tolerated the entire procedure well. A repeat set of vitals were taken after the procedure and the patient was kept under observation following institutional policy, for this type of procedure. Post-procedural neurological assessment was performed, showing return to baseline, prior to discharge. The patient was provided with post-procedure discharge instructions, including a section on how to identify potential problems. Should any problems arise concerning this procedure, the patient was given instructions to immediately contact us, at any time, without hesitation. In any case, we plan to contact the patient by telephone for a follow-up status report regarding this interventional procedure.  Comments:  No additional relevant information.  Plan of Care  Orders:  Orders Placed This Encounter  Procedures   DG PAIN CLIN[facility] C-ARM 1-60 MIN NO REPORT    Intraoperative interpretation by procedural  physician at [facility].    Standing Status:   Standing    Number of Occurrences:   1    Order Specific Question:   Reason for exam:    Answer:   Assistance in needle guidance and placement for procedures requiring needle placement in or near specific anatomical locations not easily accessible without such assistance.   Chronic Opioid Analgesic:  Hydrocodone 68m BID prn 60/month (chronic pain regimen  prior to surgery)     Medications ordered for procedure: Meds ordered this encounter  Medications   lidocaine (XYLOCAINE) 2 % (with pres) injection 400 mg   iohexol (OMNIPAQUE) 180 MG/ML injection 10 mL    Must be Myelogram-compatible. If not available, you may substitute with a water-soluble, non-ionic, hypoallergenic, myelogram-compatible radiological contrast medium.   ropivacaine (PF) 2 mg/mL (0.2%) (NAROPIN) injection 1 mL   sodium chloride flush (NS) 0.9 % injection 1 mL   dexamethasone (DECADRON) injection 10 mg   Medications administered: We administered lidocaine, iohexol, ropivacaine (PF) 2 mg/mL (0.2%), sodium chloride flush, and dexamethasone.  See the medical record for exact dosing, route, and time of administration.  Follow-up plan:   Return for Keep sch. appt.      Recent Visits Date Type Provider Dept  07/07/21 Office Visit Gillis Santa, MD [facility]  Showing recent visits within past 90 days and meeting all other requirements Today's Visits Date Type Provider Dept  08/01/21 Procedure visit Gillis Santa, MD [facility]  Showing today's visits and meeting all other requirements Future Appointments Date Type Provider Dept  08/25/21 Appointment Gillis Santa, MD [facility]  Showing future appointments within next 90 days and meeting all other requirements  Disposition: Discharge home  Discharge (Date   Time): 08/01/2021;   hrs.   Primary Care Physician: Gladstone Lighter, MD Location: [facility]  Facility Note by: Gillis Santa, MD Date: 08/01/2021; Time: 2:21 PM  Disclaimer:  Medicine is not an exact science. The only guarantee in medicine is that nothing is guaranteed. It is important to note that the decision to proceed with this intervention was based on the information collected from the patient. The Data and conclusions were drawn from the patient's questionnaire, the interview, and the physical examination. Because the information was provided in large part by the patient, it cannot be guaranteed that it has not been purposely or unconsciously manipulated. Every effort has been made to obtain as much relevant data as possible for this evaluation. It is important to note that the conclusions that lead to this procedure are derived in large part from the available data. Always take into account that the treatment will also be dependent on availability of resources and existing treatment guidelines, considered by other Pain Management Practitioners as being common knowledge and practice, at the time of the intervention. For Medico-Legal purposes, it is also important to point out that variation in procedural techniques and pharmacological choices are the acceptable norm. The indications, contraindications, technique, and results of the above procedure should only be interpreted and judged by a Board-Certified Interventional Pain Specialist with extensive familiarity and expertise in the same exact procedure and technique.

## 2021-08-02 ENCOUNTER — Telehealth: Payer: Self-pay | Admitting: *Deleted

## 2021-08-02 NOTE — Telephone Encounter (Signed)
No problems post procedure. 

## 2021-08-25 ENCOUNTER — Encounter: Payer: Self-pay | Admitting: Student in an Organized Health Care Education/Training Program

## 2021-08-25 ENCOUNTER — Other Ambulatory Visit: Payer: Self-pay

## 2021-08-25 ENCOUNTER — Ambulatory Visit
Payer: Medicare Other | Attending: Student in an Organized Health Care Education/Training Program | Admitting: Student in an Organized Health Care Education/Training Program

## 2021-08-25 VITALS — BP 144/84 | HR 69 | Temp 97.0°F | Resp 18 | Ht 66.0 in | Wt 178.0 lb

## 2021-08-25 DIAGNOSIS — M5124 Other intervertebral disc displacement, thoracic region: Secondary | ICD-10-CM | POA: Diagnosis present

## 2021-08-25 DIAGNOSIS — M5414 Radiculopathy, thoracic region: Secondary | ICD-10-CM | POA: Insufficient documentation

## 2021-08-25 DIAGNOSIS — M5416 Radiculopathy, lumbar region: Secondary | ICD-10-CM | POA: Diagnosis present

## 2021-08-25 DIAGNOSIS — G894 Chronic pain syndrome: Secondary | ICD-10-CM | POA: Insufficient documentation

## 2021-08-25 DIAGNOSIS — M4692 Unspecified inflammatory spondylopathy, cervical region: Secondary | ICD-10-CM | POA: Insufficient documentation

## 2021-08-25 DIAGNOSIS — Z981 Arthrodesis status: Secondary | ICD-10-CM | POA: Diagnosis present

## 2021-08-25 DIAGNOSIS — M5481 Occipital neuralgia: Secondary | ICD-10-CM | POA: Insufficient documentation

## 2021-08-25 MED ORDER — HYDROCODONE-ACETAMINOPHEN 10-325 MG PO TABS: 1.0000 | ORAL_TABLET | Freq: Three times a day (TID) | ORAL | 0 refills | Status: DC | PRN

## 2021-08-25 NOTE — Progress Notes (Signed)
Nursing Pain Medication Assessment:  Safety precautions to be maintained throughout the outpatient stay will include: orient to surroundings, keep bed in low position, maintain call bell within reach at all times, provide assistance with transfer out of bed and ambulation.  Medication Inspection Compliance: Pill count conducted under aseptic conditions, in front of the patient. Neither the pills nor the bottle was removed from the patient's sight at any time. Once count was completed pills were immediately returned to the patient in their original bottle.  Medication #1: Hydrocodone/APAP Pill/Patch Count:  39 of 60 pills remain Pill/Patch Appearance: Markings consistent with prescribed medication Bottle Appearance: Standard pharmacy container. Clearly labeled. Filled Date: 60 / 07 / 2022 Last Medication intake:   uncertain  (Q 8 hr script)  Medication #2: Hydrocodone/APAP Pill/Patch Count:  08 of 120 pills remain Pill/Patch Appearance: Markings consistent with prescribed medication Bottle Appearance: Standard pharmacy container. Clearly labeled. Filled Date: 20 / 22 /  2022 Last Medication intake:  Yesterday (q6 hours script)

## 2021-08-25 NOTE — Progress Notes (Signed)
PROVIDER NOTE: Information contained herein reflects review and annotations entered in association with encounter. Interpretation of such information and data should be left to medically-trained personnel. Information provided to patient can be located elsewhere in the medical record under "Patient Instructions". Document created using STT-dictation technology, any transcriptional errors that may result from process are unintentional.    Patient: Tammy Lamb  Service Category: E/M  Provider: Gillis Santa, MD  DOB: Mar 13, 1951  DOS: 08/25/2021  Specialty: Interventional Pain Management  MRN: [id number]  Setting: Ambulatory outpatient  PCP: Gladstone Lighter, MD  Type: Established Patient    Referring Provider: Gladstone Lighter, MD  Location: Office  Delivery: Face-to-face     HPI  [name], a 71 y.o. 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 Tammy Lamb primary complain today is Back Pain (lower), Hip Pain (left), and Neck Pain Last encounter: My last encounter with her was on 08/01/2021. 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 Pain Assessment: Severity of Chronic pain is reported as a 2 /10. Location: Back Lower/through left hip down left leg to toes. Onset: More than a month ago. Quality: Burning, Spasm. Timing: Constant. Modifying factor(s): procedures. Vitals:  height is _0  (1.676 m) and weight is 178 lb (80.7 kg). Her temporal temperature is 97  F (36.1 C) (abnormal). Her blood pressure is 144/84 (abnormal) and her pulse is 69. Her respiration is 18 and oxygen saturation is 99%.   Reason for encounter: both, medication management and post-procedure evaluation and assessment.   -Presents for medication management.  Has been utilizing 2-3 hydrocodone's per day.  No falls or visits to the emergency department. -Satisfactory analgesic response after previous caudal epidural steroid injection, see details below -Patient having increased thoracic pain which radiates laterally.  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  No dorsal compression was noted. -Patient states that she has an upcoming appointment with Dr. Cari Caraway.   Post-procedure evaluation     Procedure:          Anesthesia, Analgesia, Anxiolysis:  Type: Therapeutic Epidural Steroid Injection          Region: Caudal Level: Sacrococcygeal   Laterality: Midline aiming at the left  Anesthesia: Local (1-2% Lidocaine)  Anxiolysis: None  Sedation: None  Guidance: Fluoroscopy           Position: Prone   1. Lumbar radiculopathy   2. Chronic pain syndrome    NAS-11 Pain score:   Pre-procedure: 6 /10   Post-procedure: 6 /10      Effectiveness:  Initial hour after procedure: 100 %  Subsequent 4-6 hours post-procedure: 100 %  Analgesia past initial 6 hours: 80 %  Ongoing improvement:  Analgesic:  75-80% Function: Somewhat improved    Pharmacotherapy Assessment  Analgesic: Hydrocodone 51m TID prn 90/month     Monitoring: [place] PMP: PDMP reviewed during this encounter.       Pharmacotherapy: No side-effects or adverse reactions reported. Compliance: No problems identified. Effectiveness:  Clinically acceptable.  Rise Patience, RN  08/25/2021 10:02 AM  Sign when Signing Visit Nursing Pain Medication  Assessment:  Safety precautions to be maintained throughout the outpatient stay will include: orient to surroundings, keep bed in low position, maintain call bell within reach at all times, provide assistance with transfer out of bed and ambulation.  Medication Inspection Compliance: Pill count conducted under aseptic conditions, in front of the patient. Neither the pills nor the bottle was removed from the patient's sight at any time. Once count was completed pills were immediately returned to the patient in their original bottle.  Medication #1: Hydrocodone/APAP Pill/Patch Count:  39 of 60 pills remain Pill/Patch Appearance: Markings consistent with prescribed medication Bottle Appearance: Standard pharmacy container. Clearly labeled. Filled Date: 55 / 07 / 2022 Last Medication intake:   uncertain  (Q 8 hr script)  Medication #2: Hydrocodone/APAP Pill/Patch Count:  08 of 120 pills remain Pill/Patch Appearance: Markings consistent with prescribed medication Bottle Appearance: Standard pharmacy container. Clearly labeled. Filled Date: 26 / 22 /  2022 Last Medication intake:  Yesterday (q6 hours script)   UDS:  Summary  Date Value Ref Range Status  09/16/2020 Note  Final    Comment:    ==================================================================== [employer] Select 13 ([facility]) ==================================================================== Test                             Result       Flag       Units  Drug Present and Declared for Prescription Verification   Hydrocodone                    378          EXPECTED   ng/mg creat   Norhydrocodone                 1419         EXPECTED   ng/mg creat    Sources of hydrocodone include scheduled prescription medications.    Norhydrocodone is an expected metabolite of hydrocodone.  ==================================================================== Test                      Result    Flag   Units      Ref Range   Creatinine               36               mg/dL      >=20 ==================================================================== Declared Medications:  The flagging and interpretation on this report are based on the  following declared medications.  Unexpected results may arise from  inaccuracies in the declared medications.   **Note: The testing scope of this panel includes these medications:   Hydrocodone   **Note: The testing scope of this panel does not include the  following reported medications:   Acetaminophen  Albuterol  Amlodipine  Atorvastatin  Cephalexin  Cinnamon  Citalopram  Gabapentin  Hydrochlorothiazide  Hydroxyzine  Magnesium (Mag-Ox)  Metformin  Omeprazole  Pentosan ([place])  Ramipril  Sodium Bicarbonate  Sulfasalazine  Tizanidine  Vitamin B12  Vitamin C  Vitamin D3 ==================================================================== For clinical consultation, please call [phone]. ====================================================================      ROS  Constitutional: Denies any fever or chills Gastrointestinal: No reported hemesis, hematochezia, vomiting, or acute GI distress Musculoskeletal:  mid thoracic pain Neurological: No reported episodes of acute onset apraxia, aphasia, dysarthria, agnosia, amnesia, paralysis, loss  of coordination, or loss of consciousness  Medication Review  Cinnamon, HYDROcodone-acetaminophen, Omeprazole-Sodium Bicarbonate, Pentosan Polysulfate Sodium, acetaminophen, albuterol, amLODipine, amitriptyline, ascorbic acid, atorvastatin, celecoxib, cephALEXin, cholecalciferol, citalopram, fluticasone-salmeterol, gabapentin, hydrOXYzine, hydrochlorothiazide, magnesium oxide, metFORMIN, oxybutynin, polyethylene glycol, sulfaSALAzine, tiZANidine, valsartan, and vitamin B-12  History Review  Allergy: Tammy Lamb is allergic to aspirin and sulfa antibiotics. Drug: Tammy Lamb  reports no history of drug use. Alcohol:  reports that she does not  currently use alcohol. Tobacco:  reports that she has been smoking cigarettes. She has a 24.50 pack-year smoking history. She has never used smokeless tobacco. Social: Tammy Lamb  reports that she has been smoking cigarettes. She has a 24.50 pack-year smoking history. She has never used smokeless tobacco. She reports that she does not currently use alcohol. She reports that she does not use drugs. Medical:  has a past medical history of Anemia, Anxiety, Arthritis, Bilateral carotid artery stenosis, Chronic lower back pain, Colitis, ulcerative ([place]), COPD (chronic obstructive pulmonary disease) ([place]), Depression, Diabetes mellitus without complication ([place]), GERD (gastroesophageal reflux disease), H/O bladder infections, History of 2019 novel coronavirus disease (COVID-19) (02/2021), History of hiatal hernia, HLD (hyperlipidemia), Hypertension, Interstitial cystitis, Low sodium levels, Motion sickness, Parkinson's disease ([place]), PVD (peripheral vascular disease) ([place]), Sinus headache, Stroke ([place]) (2015), TIA (transient ischemic attack) (2015), Urinary frequency, and Wears dentures. Surgical: Tammy Lamb  has a past surgical history that includes Tonsillectomy; Cholecystectomy; Abdominal hysterectomy; Diagnostic laparoscopy; Bladder suspension; Colonoscopy; Upper gi endoscopy; Knee arthroscopy (Left, 02/13/2014); Ankle arthroscopy (Left, 03/22/2016); Tendon repair (Left, 03/22/2016); Lumbar laminectomy/decompression microdiscectomy (N/A, 05/30/2017); Anterior cervical decomp/discectomy fusion (N/A, 04/09/2019); and Lumbar laminectomy/decompression microdiscectomy (N/A, 05/27/2021). Family: family history includes COPD in her father; Diabetes in her mother; Heart disease in her father; Hypertension in her father and mother.  Laboratory Chemistry Profile   Renal Lab Results  Component Value Date   BUN 9 05/23/2021   CREATININE 0.73 05/23/2021   GFRAA >60 04/02/2019   GFRNONAA >60 05/23/2021    Hepatic No  results found for: AST, ALT, ALBUMIN, ALKPHOS, HCVAB, AMYLASE, LIPASE, AMMONIA  Electrolytes Lab Results  Component Value Date   NA 133 (L) 05/23/2021   K 3.9 05/23/2021   CL 95 (L) 05/23/2021   CALCIUM 9.0 05/23/2021    Bone No results found for: VD25OH, [id number], [id number], SM2707EM7, 25OHVITD1, 25OHVITD2, 25OHVITD3, TESTOFREE, TESTOSTERONE  Inflammation (CRP: Acute Phase) (ESR: Chronic Phase) No results found for: CRP, ESRSEDRATE, LATICACIDVEN       Note: Above Lab results reviewed.   Physical Exam  General appearance: Well nourished, well developed, and well hydrated. In no apparent acute distress Mental status: Alert, oriented x 3 (person, place, & time)       Respiratory: No evidence of acute respiratory distress Eyes: PERLA Vitals: BP (!) 144/84    Pulse 69    Temp (!) 97 F (36.1 C) (Temporal)    Resp 18    Ht _0  (1.676 m)    Wt 178 lb (80.7 kg)    SpO2 99%    BMI 28.73 kg/m  BMI: Estimated body mass index is 28.73 kg/m as calculated from the following:   Height as of this encounter: _1  (1.676 m).   Weight as of this encounter: 178 lb (80.7 kg). Ideal: Ideal body weight: 59.3 kg (130 lb 11.7 oz) Adjusted ideal body weight: 67.9 kg (149 lb 10.2 oz)  Thoracic Spine Area Exam  Skin & Axial Inspection: No masses, redness, or swelling Alignment: Symmetrical Functional ROM: Pain restricted ROM Stability:  No instability detected Muscle Tone/Strength: Functionally intact. No obvious neuro-muscular anomalies detected. Sensory (Neurological): Neurogenic pain pattern Muscle strength & Tone: No palpable anomalies Lumbar Spine Area Exam  Skin & Axial Inspection: No masses, redness, or swelling Alignment: Symmetrical Functional ROM: Pain restricted ROM       improved after caudal Stability: No instability detected Muscle Tone/Strength: Functionally intact. No obvious neuro-muscular anomalies detected. Sensory (Neurological): Neurogenic pain pattern  Gait & Posture  Assessment  Ambulation: Unassisted Gait: Antalgic Posture: Difficulty standing up straight, due to pain  Lower Extremity Exam    Side: Right lower extremity  Side: Left lower extremity  Stability: No instability observed          Stability: No instability observed          Skin & Extremity Inspection: Skin color, temperature, and hair growth are WNL. No peripheral edema or cyanosis. No masses, redness, swelling, asymmetry, or associated skin lesions. No contractures.  Skin & Extremity Inspection: Skin color, temperature, and hair growth are WNL. No peripheral edema or cyanosis. No masses, redness, swelling, asymmetry, or associated skin lesions. No contractures.  Functional ROM: Pain restricted ROM for hip joint          Functional ROM: Pain restricted ROM for hip joint          Muscle Tone/Strength: Functionally intact. No obvious neuro-muscular anomalies detected.  Muscle Tone/Strength: Functionally intact. No obvious neuro-muscular anomalies detected.  Sensory (Neurological): Neurogenic pain pattern        Sensory (Neurological): Neurogenic pain pattern        DTR: Patellar: deferred today Achilles: deferred today Plantar: deferred today  DTR: Patellar: deferred today Achilles: deferred today Plantar: deferred today  Palpation: No palpable anomalies  Palpation: No palpable anomalies    Assessment   Status Diagnosis  Having a Flare-up Having a Flare-up Controlled 1. Thoracic disc herniation   2. Radicular pain of thoracic region   3. Lumbar radiculopathy   4. Bilateral occipital neuralgia   5. Inflammatory spondylopathy of cervical region ([place])   6. S/P cervical spinal fusion (C3-C4 ACDF (03/2019)   7. Chronic pain syndrome      Updated Problems: Problem  Thoracic Disc Herniation  Radicular Pain of Thoracic Region    Plan of Care  Problem-specific:  No problem-specific Assessment & Plan notes found for this encounter.  Tammy Lamb has a current medication list  which includes the following long-term medication(s): amlodipine, atorvastatin, fluticasone-salmeterol, gabapentin, hydrochlorothiazide, metformin, sulfasalazine, tizanidine, valsartan, [START ON 09/08/2021] hydrocodone-acetaminophen, and [START ON 10/08/2021] hydrocodone-acetaminophen.  Pharmacotherapy (Medications Ordered): Meds ordered this encounter  Medications   HYDROcodone-acetaminophen (NORCO) 10-325 MG tablet    Sig: Take 1 tablet by mouth every 8 (eight) hours as needed for severe pain.    Dispense:  90 tablet    Refill:  0   HYDROcodone-acetaminophen (NORCO) 10-325 MG tablet    Sig: Take 1 tablet by mouth every 8 (eight) hours as needed for severe pain.    Dispense:  90 tablet    Refill:  0   Continue gabapentin 900 mg twice daily, Continue amitriptyline 25 mg nightly neuropathic pain. Continue tizanidine as needed as needed for lumbar paraspinal muscle spasms. No refills needed for the above medications at this time. Follow-up for thoracic epidural steroid injection in approximately 1 month. Urine toxicology screen for annual medication compliance and monitoring.  Orders:  Orders Placed This Encounter  Procedures   Thoracic Epidural Injection    Standing Status:   Future  Standing Expiration Date:   09/22/2021    Scheduling Instructions:     Level: T7/T8     Sedation: none     Timeframe: 1 month    Order Specific Question:   Where will this procedure be performed?    Answer:   [facility]   [employer] Select 13 (MW), Urine    Volume: 30 ml(s). Minimum 3 ml of urine is needed. Document temperature of fresh sample. Indications: Long term (current) use of opiate analgesic [phone])    Order Specific Question:   Release to patient    Answer:   Immediate   Follow-up plan:   Return in about 5 weeks (around 09/29/2021) for T7/T8 ESI , in clinic NS.    Recent Visits Date Type Provider Dept  08/01/21 Procedure visit Gillis Santa, MD [facility]   07/07/21 Office Visit Gillis Santa, MD [facility]  Showing recent visits within past 90 days and meeting all other requirements Today's Visits Date Type Provider Dept  08/25/21 Office Visit Gillis Santa, MD [facility]  Showing today's visits and meeting all other requirements Future Appointments Date Type Provider Dept  11/01/21 Appointment Gillis Santa, MD [facility]  Showing future appointments within next 90 days and meeting all other requirements  I discussed the assessment and treatment plan with the patient. The patient was provided an opportunity to ask questions and all were answered. The patient agreed with the plan and demonstrated an understanding of the instructions.  Patient advised to call back or seek an in-person evaluation if the symptoms or condition worsens.  Duration of encounter: 61mnutes.  Note by: BGillis Santa MD Date: 08/25/2021; Time: 10:48 AM

## 2021-09-01 LAB — TOXASSURE SELECT 13 (MW), URINE

## 2021-09-05 ENCOUNTER — Other Ambulatory Visit: Payer: Self-pay | Admitting: *Deleted

## 2021-09-05 DIAGNOSIS — F17210 Nicotine dependence, cigarettes, uncomplicated: Secondary | ICD-10-CM

## 2021-09-05 DIAGNOSIS — Z87891 Personal history of nicotine dependence: Secondary | ICD-10-CM

## 2021-09-14 ENCOUNTER — Encounter: Payer: Self-pay | Admitting: Acute Care

## 2021-09-14 ENCOUNTER — Ambulatory Visit (INDEPENDENT_AMBULATORY_CARE_PROVIDER_SITE_OTHER): Payer: Medicare Other | Admitting: Acute Care

## 2021-09-14 DIAGNOSIS — F17210 Nicotine dependence, cigarettes, uncomplicated: Secondary | ICD-10-CM

## 2021-09-14 NOTE — Progress Notes (Signed)
Virtual Visit via Video Note  I connected with Tammy Lamb on 09/14/21 at 11:00 AM EST by a video enabled telemedicine application and verified that I am speaking with the correct person using two identifiers.  Location: Patient:  At home Provider:  [place], [place], [place], [address]    I discussed the limitations of evaluation and management by telemedicine and the availability of in person appointments. The patient expressed understanding and agreed to proceed.    Shared Decision Making Visit Lung Cancer Screening Program [phone])   Eligibility: Age 71 y.o. Pack Years Smoking History Calculation 75 pack year smoking history (# packs/per year x # years smoked) Recent History of coughing up blood  no Unexplained weight loss? no ( >Than 15 pounds within the last 6 months ) Prior History Lung / other cancer no (Diagnosis within the last 5 years already requiring surveillance chest CT Scans). Smoking Status Current Smoker Former Smokers: Years since quit:  NA  Quit Date:  NA  Visit Components: Discussion included one or more decision making aids. yes Discussion included risk/benefits of screening. yes Discussion included potential follow up diagnostic testing for abnormal scans. yes Discussion included meaning and risk of over diagnosis. yes Discussion included meaning and risk of False Positives. yes Discussion included meaning of total radiation exposure. yes  Counseling Included: Importance of adherence to annual lung cancer LDCT screening. yes Impact of comorbidities on ability to participate in the program. yes Ability and willingness to under diagnostic treatment. yes  Smoking Cessation Counseling: Current Smokers:  Discussed importance of smoking cessation. yes Information about tobacco cessation classes and interventions provided to patient. yes Patient provided with "ticket" for LDCT Scan. yes Symptomatic Patient. no  Counseling NA Diagnosis Code:  Tobacco Use Z72.0 Asymptomatic Patient yes  Counseling (Intermediate counseling: > three minutes counseling) [id number] Former Smokers:  Discussed the importance of maintaining cigarette abstinence. yes Diagnosis Code: Personal History of Nicotine Dependence. D74.128 Information about tobacco cessation classes and interventions provided to patient. Yes Patient provided with "ticket" for LDCT Scan. yes Written Order for Lung Cancer Screening with LDCT placed in Epic. Yes (CT Chest Lung Cancer Screening Low Dose W/O CM) [id number] Z12.2-Screening of respiratory organs Z87.891-Personal history of nicotine dependence  I have spent 25 minutes of face to face/ virtual visit   time with  [name] discussing the risks and benefits of lung cancer screening. We viewed / discussed a power point together that explained in detail the above noted topics. We paused at intervals to allow for questions to be asked and answered to ensure understanding.We discussed that the single most powerful action that she can take to decrease her risk of developing lung cancer is to quit smoking. We discussed whether or not she is ready to commit to setting a quit date. We discussed options for tools to aid in quitting smoking including nicotine replacement therapy, non-nicotine medications, support groups, Quit Smart classes, and behavior modification. We discussed that often times setting smaller, more achievable goals, such as eliminating 1 cigarette a day for a week and then 2 cigarettes a day for a week can be helpful in slowly decreasing the number of cigarettes smoked. This allows for a sense of accomplishment as well as providing a clinical benefit. I provided  her  with smoking cessation  information  with contact information for community resources, classes, free nicotine replacement therapy, and access to mobile apps, text messaging, and on-line smoking cessation help. I have also provided  her  the office contact information in  the event she needs to contact me, or the screening staff. We discussed the time and location of the scan, and that either Doroteo Glassman RN, Joella Prince, RN  or I will call / send a letter with the results within 24-72 hours of receiving them. The patient verbalized understanding of all of  the above and had no further questions upon leaving the office. They have my contact information in the event they have any further questions.  I spent 3 minutes counseling on smoking cessation and the health risks of continued tobacco abuse.  I explained to the patient that there has been a high incidence of coronary artery disease noted on these exams. I explained that this is a non-gated exam therefore degree or severity cannot be determined. This patient is on statin therapy. I have asked the patient to follow-up with their PCP regarding any incidental finding of coronary artery disease and management with diet or medication as their PCP  feels is clinically indicated. The patient verbalized understanding of the above and had no further questions upon completion of the visit.      Magdalen Spatz, NP 09/14/2021

## 2021-09-14 NOTE — Patient Instructions (Signed)
Thank you for participating in the [facility]. °It was our pleasure to meet you today. °We will call you with the results of your scan within the next few days. °Your scan will be assigned a Lung RADS category score by the physicians reading the scans.  °This Lung RADS score determines follow up scanning.  °See below for description of categories, and follow up screening recommendations. °We will be in touch to schedule your follow up screening annually or based on recommendations of our providers. °We will fax a copy of your scan results to your Primary Care Physician, or the physician who referred you to the program, to ensure they have the results. °Please call the office if you have any questions or concerns regarding your scanning experience or results.  °Our office number is [phone]. °Please speak with Denise Phelps, RN. She is our Lung Cancer Screening RN. °If she is unavailable when you call, please have the office staff send her a message. She will return your call at her earliest convenience. °Remember, if your scan is normal, we will scan you annually as long as you continue to meet the criteria for the program. (Age 55-77, Current smoker or smoker who has quit within the last 15 years). °If you are a smoker, remember, quitting is the single most powerful action that you can take to decrease your risk of lung cancer and other pulmonary, breathing related problems. °We know quitting is hard, and we are here to help.  °Please let us know if there is anything we can do to help you meet your goal of quitting. °If you are a former smoker, congratulations. We are proud of you! Remain smoke free! °Remember you can refer friends or family members through the number above.  °We will screen them to make sure they meet criteria for the program. °Thank you for helping us take better care of you by participating in Lung Screening. ° °You can receive free nicotine replacement therapy  ( patches, gum or mints) by calling 1-800-QUIT NOW. Please call so we can get you on the path to becoming  a non-smoker. I know it is hard, but you can do this! ° °Lung RADS Categories: ° °Lung RADS 1: no nodules or definitely non-concerning nodules.  °Recommendation is for a repeat annual scan in 12 months. ° °Lung RADS 2:  nodules that are non-concerning in appearance and behavior with a very low likelihood of becoming an active cancer. °Recommendation is for a repeat annual scan in 12 months. ° °Lung RADS 3: nodules that are probably non-concerning , includes nodules with a low likelihood of becoming an active cancer.  Recommendation is for a 6-month repeat screening scan. Often noted after an upper respiratory illness. We will be in touch to make sure you have no questions, and to schedule your 6-month scan. ° °Lung RADS 4 A: nodules with concerning findings, recommendation is most often for a follow up scan in 3 months or additional testing based on our provider's assessment of the scan. We will be in touch to make sure you have no questions and to schedule the recommended 3 month follow up scan. ° °Lung RADS 4 B:  indicates findings that are concerning. We will be in touch with you to schedule additional diagnostic testing based on our provider's  assessment of the scan. ° °Hypnosis for smoking cessation  °[employer] °[phone] ° °Acupuncture for smoking cessation  °[facility] °[phone]  °

## 2021-09-15 ENCOUNTER — Ambulatory Visit: Admission: RE | Admit: 2021-09-15 | Payer: Medicare Other | Source: Ambulatory Visit

## 2021-09-28 ENCOUNTER — Other Ambulatory Visit: Payer: Self-pay

## 2021-09-28 ENCOUNTER — Telehealth: Payer: Self-pay | Admitting: Acute Care

## 2021-09-28 ENCOUNTER — Ambulatory Visit
Admission: RE | Admit: 2021-09-28 | Discharge: 2021-09-28 | Disposition: A | Discharge status: Home / Self Care | Payer: Medicare Other | Source: Ambulatory Visit | Attending: Acute Care | Admitting: Acute Care

## 2021-09-28 DIAGNOSIS — F17210 Nicotine dependence, cigarettes, uncomplicated: Secondary | ICD-10-CM

## 2021-09-28 DIAGNOSIS — Z87891 Personal history of nicotine dependence: Secondary | ICD-10-CM | POA: Insufficient documentation

## 2021-09-28 NOTE — Telephone Encounter (Signed)
Spoke with Kenney Houseman at [facility] Radiology- call report on LDCT done 09/28/21 Impression: ? ?IMPRESSION: ?1. Lung-RADS 3, probably benign findings. Short-term follow-up in 6 ?months is recommended with repeat low-dose chest CT without contrast ?(please use the following order, "CT CHEST LCS NODULE FOLLOW-UP W/O ?CM"). ?  ?6.9 mm lateral inferior left lower lobe nodule. ?  ?These results will be called to the ordering clinician or ?representative by the Radiologist Assistant, and communication ?documented in the PACS or [employer]. ?2. Air-filled and dilated esophagus, suggesting dysmotility. ?Associated large hiatal hernia containing the majority of the ?stomach. ?3. Aortic atherosclerosis (ICD10-I70.0). Coronary artery ?calcification. ?4.  Emphysema (ICD10-J43.9). ?  ?  ?Electronically Signed ?  By: Lorin Picket M.D. ?  On: 09/28/2021 16:12 ? ?Sending to Judson Roch to be called through the screening program  ?  ?

## 2021-09-29 NOTE — Telephone Encounter (Signed)
CT results faxed to PCP with f/u plans included. Order placed for 6 mth nodule f/u CT.  ?

## 2021-09-29 NOTE — Telephone Encounter (Signed)
I have called the patient with the results of the low dose Ct Chest. I explained that her scan was read as a Lung  RADS 3, nodules that are probably benign findings, short term follow up suggested: includes nodules with a low likelihood of becoming a clinically active cancer. Radiology recommends a 6 month repeat LDCT follow up. I explained that there is a 6.9 mm nodule in the left lower lobe that we would like to assess again In 6 months with a CT scan to make sure it is stable. She is in agreement with this plan.I explained she will get a call closer to June to get this scheduled. She verbalized understanding, and had no further questions at completion of the call.  ?Langley Gauss, please fax results to PCP and place order for 6 month follow up. Thanks so much ?

## 2021-11-01 ENCOUNTER — Ambulatory Visit
Payer: Medicare Other | Attending: Student in an Organized Health Care Education/Training Program | Admitting: Student in an Organized Health Care Education/Training Program

## 2021-11-01 ENCOUNTER — Encounter: Payer: Self-pay | Admitting: Student in an Organized Health Care Education/Training Program

## 2021-11-01 VITALS — BP 148/97 | HR 76 | Temp 97.1°F | Ht 66.0 in | Wt 177.0 lb

## 2021-11-01 DIAGNOSIS — M4692 Unspecified inflammatory spondylopathy, cervical region: Secondary | ICD-10-CM | POA: Insufficient documentation

## 2021-11-01 DIAGNOSIS — M5414 Radiculopathy, thoracic region: Secondary | ICD-10-CM | POA: Insufficient documentation

## 2021-11-01 DIAGNOSIS — G894 Chronic pain syndrome: Secondary | ICD-10-CM | POA: Insufficient documentation

## 2021-11-01 DIAGNOSIS — M5124 Other intervertebral disc displacement, thoracic region: Secondary | ICD-10-CM | POA: Diagnosis present

## 2021-11-01 DIAGNOSIS — Z981 Arthrodesis status: Secondary | ICD-10-CM | POA: Diagnosis present

## 2021-11-01 MED ORDER — TIZANIDINE HCL 4 MG PO TABS: 4.0000 mg | ORAL_TABLET | Freq: Three times a day (TID) | ORAL | 2 refills | Status: DC | PRN

## 2021-11-01 MED ORDER — GABAPENTIN 300 MG PO CAPS
900.0000 mg | ORAL_CAPSULE | Freq: Two times a day (BID) | ORAL | 2 refills | Status: DC
Start: 2021-11-01 — End: 2021-12-26

## 2021-11-01 MED ORDER — HYDROCODONE-ACETAMINOPHEN 10-325 MG PO TABS: 1.0000 | ORAL_TABLET | Freq: Three times a day (TID) | ORAL | 0 refills | Status: DC | PRN

## 2021-11-01 NOTE — Progress Notes (Signed)
PROVIDER NOTE: Information contained herein reflects review and annotations entered in association with encounter. Interpretation of such information and data should be left to medically-trained personnel. Information provided to patient can be located elsewhere in the medical record under "Patient Instructions". Document created using STT-dictation technology, any transcriptional errors that may result from process are unintentional.  ?  ?Patient: Tammy Lamb  Service Category: E/M  Provider: Gillis Santa, MD  ?DOB: [birth year]  DOS: 11/01/2021  Specialty: Interventional Pain Management  ?MRN: [id number]  Setting: Ambulatory outpatient  PCP: Gladstone Lighter, MD  ?Type: Established Patient    Referring Provider: Gladstone Lighter, MD  ?Location: Office  Delivery: Face-to-face    ? ?HPI  ?[name], a 71 y.o. 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 Ms. Hise primary complain today is Neck Pain ?Last encounter: My last encounter with her was on 08/01/2021. ?Pertinent problems: Ms. 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 ?Pain Assessment: Severity of Chronic pain is reported as a 7 /10. Location: Neck (hip, leg, foot) Lower/pain radiaties everywhere. Onset: More than a month ago. Quality: Aching, Burning, Constant, Throbbing, Stabbing, Shooting. Timing: Constant. Modifying factor(s): Meds,. ?Vitals:  height is 5' 6"  (1.676 m) and weight is 177 lb (80.3 kg). Her temperature is 97.1 ?F (36.2  ?C) (abnormal). Her blood pressure is 148/97 (abnormal) and her pulse is 76. Her oxygen saturation is 96%.  ? ?Reason for encounter: both, medication management and post-procedure evaluation and assessment.  ? ?-Presents for medication management.  She has been utilizing 2-3 hydrocodone's per day.  No falls or visits to the emergency department. ?-Patient having increased thoracic pain which radiates laterally.  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  No dorsal compression was noted. ?-I did offer the patient a thoracic epidural steroid injection for her pain symptoms. ?-She continues to endorse low back pain with radiation into her left buttock and left leg. ? ? ?Pharmacotherapy Assessment  ?Analgesic: Hydrocodone 57m TID prn 90/month ?   ? ?Monitoring: ?[place] PMP: PDMP reviewed during this encounter.       ?Pharmacotherapy: No side-effects or adverse reactions reported. ?Compliance: No problems identified. ?Effectiveness: Clinically acceptable. ? ?BChauncey Fischer RN  11/01/2021  2:03 PM  Sign when Signing Visit ?Nursing Pain Medication Assessment:  ?Safety precautions to be maintained throughout the outpatient stay will include: orient to surroundings, keep bed in low position, maintain call bell within reach at all times, provide assistance with transfer out of bed and ambulation.  ?Medication Inspection Compliance: Pill count conducted under aseptic conditions, in front of the patient. Neither the pills nor the bottle was removed from the patient's sight at any time. Once count was completed pills were immediately returned to the patient in their original bottle. ? ?Medication: Hydrocodone/APAP ?Pill/Patch Count:  2 of 90 pills remain ?Pill/Patch Appearance: Markings consistent with prescribed medication ?Bottle Appearance: Standard pharmacy  container. Clearly labeled. ?Filled Date: 2 / 24 / 2023 ?Last Medication intake:  TodaySafety precautions to  be maintained throughout the outpatient stay will include: orient to surroundings, keep bed in low position, maintain call bell within reach at all times, provide assistance with transfer out of bed and ambulation.  ?  ?  UDS:  ?Summary  ?Date Value Ref Range Status  ?08/25/2021 Note  Final  ?  Comment:  ?  ==================================================================== ?Tox[employer] Select 13 (MW) ?==================================================================== ?Test                             Result       Flag       Units ? ?Drug Present and Declared for Prescription Verification ?  Hydrocodone                    512          EXPECTED   ng/mg creat ?  Norhydrocodone                 2435         EXPECTED   ng/mg creat ?   Sources of hydrocodone include scheduled prescription medications. ?   Norhydrocodone is an expected metabolite of hydrocodone. ? ?==================================================================== ?Test                      Result    Flag   Units      Ref Range ?  Creatinine              34               mg/dL      >=20 ?==================================================================== ?Declared Medications: ? The flagging and interpretation on this report are based on the ? following declared medications.  Unexpected results may arise from ? inaccuracies in the declared medications. ? ? **Note: The testing scope of this panel includes these medications: ? ? Hydrocodone (Norco) ? ? **Note: The testing scope of this panel does not include the ? following reported medications: ? ? Acetaminophen (Tylenol) ? Acetaminophen (Norco) ? Albuterol ? Amitriptyline (Elavil) ? Amlodipine (Norvasc) ? Atorvastatin (Lipitor) ? Celecoxib (Celebrex) ? Cephalexin (Keflex) ? Cholecalciferol ? Cinnamon ? Citalopram (Celexa) ? Cyanocobalamin ? Fluticasone (Advair) ? Gabapentin (Neurontin) ?  Hydrochlorothiazide ? Hydroxyzine (Atarax) ? Magnesium (Mag-Ox) ? Metformin ? Omeprazole ? Oxybutynin ? Pentosan ? Polyethylene Glycol (MiraLAX) ? Salmeterol (Advair) ? Sodium Bicarbonate ? Sulfasalazine (Azulfidine) ? Tizanidine ? Valsartan (Diovan) ? Vitamin C ?==================================================================== ?For clinical consultation, please call [phone]. ?==================================================================== ?  ?  ? ?ROS  ?Constitutional: Denies any fever or chills ?Gastrointestinal: No reported hemesis, hematochezia, vomiting, or acute GI distress ?Musculoskeletal:  mid thoracic pain with lateral radiation ?Neurological: No reported episodes of acute onset apraxia, aphasia, dysarthria, agnosia, amnesia, paralysis, loss of coordination, or loss of consciousness ? ?Medication Review  ?Cinnamon, Fish Oil, HYDROcodone-acetaminophen, Omeprazole-Sodium Bicarbonate, Pentosan Polysulfate Sodium, acetaminophen, albuterol, amLODipine, amitriptyline, ascorbic acid, atorvastatin, celecoxib, cephALEXin, cholecalciferol, citalopram, fluticasone-salmeterol, gabapentin, hydrOXYzine, hydrochlorothiazide, magnesium oxide, metFORMIN, oxybutynin, polyethylene glycol, sulfaSALAzine, tiZANidine, valsartan, and vitamin B-12 ? ?History Review  ?Allergy: Ms. Woulfe is allergic to aspirin and sulfa antibiotics. ?Drug: Ms. Mealy  reports no history of drug use. ?Alcohol:  reports that she does not currently use alcohol. ?Tobacco:  reports that she has been smoking cigarettes. She has a 24.50 pack-year smoking history. She has never used smokeless tobacco. ?Social: Ms. Roell  reports that she has been smoking cigarettes. She has a 24.50 pack-year smoking history. She  has never used smokeless tobacco. She reports that she does not currently use alcohol. She reports that she does not use drugs. ?Medical:  has a past medical history of Anemia, Anxiety, Arthritis, Bilateral carotid artery stenosis,  Chronic lower back pain, Colitis, ulcerative ([place]), COPD (chronic obstructive pulmonary disease) ([place]), Depression, Diabetes mellitus without complication ([place]), GERD (gastroesophageal reflux disease), H/O bladder inf

## 2021-11-01 NOTE — Progress Notes (Signed)
Nursing Pain Medication Assessment:  ?Safety precautions to be maintained throughout the outpatient stay will include: orient to surroundings, keep bed in low position, maintain call bell within reach at all times, provide assistance with transfer out of bed and ambulation.  ?Medication Inspection Compliance: Pill count conducted under aseptic conditions, in front of the patient. Neither the pills nor the bottle was removed from the patient's sight at any time. Once count was completed pills were immediately returned to the patient in their original bottle. ? ?Medication: Hydrocodone/APAP ?Pill/Patch Count:  2 of 90 pills remain ?Pill/Patch Appearance: Markings consistent with prescribed medication ?Bottle Appearance: Standard pharmacy container. Clearly labeled. ?Filled Date: 2 / 24 / 2023 ?Last Medication intake:  TodaySafety precautions to be maintained throughout the outpatient stay will include: orient to surroundings, keep bed in low position, maintain call bell within reach at all times, provide assistance with transfer out of bed and ambulation.  ?

## 2021-11-01 NOTE — Patient Instructions (Signed)
______________________________________________________________________ ? ?Preparing for your procedure (without sedation) ? ?Procedure appointments are limited to planned procedures: ?No Prescription Refills. ?No disability issues will be discussed. ?No medication changes will be discussed. ? ?Instructions: ?Oral Intake: Do not eat or drink anything for at least 6 hours prior to your procedure. (Exception: Blood Pressure Medication. See below.) ?Transportation: Unless otherwise stated by your physician, you may drive yourself after the procedure. ?Blood Pressure Medicine: Do not forget to take your blood pressure medicine with a sip of water the morning of the procedure. If your Diastolic (lower reading)is above 100 mmHg, elective cases will be cancelled/rescheduled. ?Blood thinners: These will need to be stopped for procedures. Notify our staff if you are taking any blood thinners. Depending on which one you take, there will be specific instructions on how and when to stop it. ?Diabetics on insulin: Notify the staff so that you can be scheduled 1st case in the morning. If your diabetes requires high dose insulin, take only ? of your normal insulin dose the morning of the procedure and notify the staff that you have done so. ?Preventing infections: Shower with an antibacterial soap the morning of your procedure.  ?Build-up your immune system: Take 1000 mg of Vitamin C with every meal (3 times a day) the day prior to your procedure. ?Antibiotics: Inform the staff if you have a condition or reason that requires you to take antibiotics before dental procedures. ?Pregnancy: If you are pregnant, call and cancel the procedure. ?Sickness: If you have a cold, fever, or any active infections, call and cancel the procedure. ?Arrival: You must be in the facility at least 30 minutes prior to your scheduled procedure. ?Children: Do not bring any children with you. ?Dress appropriately: Bring dark clothing that you would not mind  if they get stained. ?Valuables: Do not bring any jewelry or valuables. ? ?Reasons to call and reschedule or cancel your procedure: (Following these recommendations will minimize the risk of a serious complication.) ?Surgeries: Avoid having procedures within 2 weeks of any surgery. (Avoid for 2 weeks before or after any surgery). ?Flu Shots: Avoid having procedures within 2 weeks of a flu shots or . (Avoid for 2 weeks before or after immunizations). ?Barium: Avoid having a procedure within 7-10 days after having had a radiological study involving the use of radiological contrast. (Myelograms, Barium swallow or enema study). ?Heart attacks: Avoid any elective procedures or surgeries for the initial 6 months after a "Myocardial Infarction" (Heart Attack). ?Blood thinners: It is imperative that you stop these medications before procedures. Let us know if you if you take any blood thinner.  ?Infection: Avoid procedures during or within two weeks of an infection (including chest colds or gastrointestinal problems). Symptoms associated with infections include: Localized redness, fever, chills, night sweats or profuse sweating, burning sensation when voiding, cough, congestion, stuffiness, runny nose, sore throat, diarrhea, nausea, vomiting, cold or Flu symptoms, recent or current infections. It is specially important if the infection is over the area that we intend to treat. ?Heart and lung problems: Symptoms that may suggest an active cardiopulmonary problem include: cough, chest pain, breathing difficulties or shortness of breath, dizziness, ankle swelling, uncontrolled high or unusually low blood pressure, and/or palpitations. If you are experiencing any of these symptoms, cancel your procedure and contact your primary care physician for an evaluation. ? ?Remember:  ?Regular Business hours are:  ?Monday to Thursday 8:00 AM to 4:00 PM ? ?Provider's Schedule: ?Milinda Pointer, MD:  ?Procedure days: Tuesday and Thursday  7:30 AM to 4:00 PM ? ?Gillis Santa, MD:  ?Procedure days: Monday and Wednesday 7:30 AM to 4:00 PM ?______________________________________________________________________ ?  ?

## 2021-11-11 ENCOUNTER — Other Ambulatory Visit: Payer: Self-pay | Admitting: Podiatry

## 2021-11-11 DIAGNOSIS — M25572 Pain in left ankle and joints of left foot: Secondary | ICD-10-CM

## 2021-11-11 DIAGNOSIS — G5782 Other specified mononeuropathies of left lower limb: Secondary | ICD-10-CM

## 2021-11-14 ENCOUNTER — Ambulatory Visit (HOSPITAL_BASED_OUTPATIENT_CLINIC_OR_DEPARTMENT_OTHER): Payer: Medicare Other | Admitting: Student in an Organized Health Care Education/Training Program

## 2021-11-14 ENCOUNTER — Encounter: Payer: Self-pay | Admitting: Student in an Organized Health Care Education/Training Program

## 2021-11-14 ENCOUNTER — Ambulatory Visit
Admission: RE | Admit: 2021-11-14 | Discharge: 2021-11-14 | Disposition: A | Discharge status: Home / Self Care | Payer: Medicare Other | Source: Ambulatory Visit | Attending: Student in an Organized Health Care Education/Training Program | Admitting: Student in an Organized Health Care Education/Training Program

## 2021-11-14 ENCOUNTER — Other Ambulatory Visit: Payer: Self-pay

## 2021-11-14 VITALS — BP 168/99 | HR 67 | Temp 97.2°F | Resp 17 | Ht 66.0 in | Wt 177.0 lb

## 2021-11-14 DIAGNOSIS — M5124 Other intervertebral disc displacement, thoracic region: Secondary | ICD-10-CM | POA: Insufficient documentation

## 2021-11-14 DIAGNOSIS — M25572 Pain in left ankle and joints of left foot: Secondary | ICD-10-CM | POA: Diagnosis present

## 2021-11-14 DIAGNOSIS — M5414 Radiculopathy, thoracic region: Secondary | ICD-10-CM | POA: Diagnosis present

## 2021-11-14 DIAGNOSIS — G5782 Other specified mononeuropathies of left lower limb: Secondary | ICD-10-CM | POA: Insufficient documentation

## 2021-11-14 DIAGNOSIS — G894 Chronic pain syndrome: Secondary | ICD-10-CM

## 2021-11-14 MED ORDER — ROPIVACAINE HCL 2 MG/ML IJ SOLN
2.0000 mL | Freq: Once | INTRAMUSCULAR | Status: AC
  Administered 2021-11-14: 20 mL via EPIDURAL
  Filled 2021-11-14: qty 20

## 2021-11-14 MED ORDER — IOHEXOL 180 MG/ML  SOLN
10.0000 mL | Freq: Once | INTRAMUSCULAR | Status: AC
  Administered 2021-11-14: 10 mL via EPIDURAL
  Filled 2021-11-14: qty 20

## 2021-11-14 MED ORDER — SODIUM CHLORIDE 0.9% FLUSH
2.0000 mL | Freq: Once | INTRAVENOUS | Status: AC
  Administered 2021-11-14: 10 mL

## 2021-11-14 MED ORDER — DEXAMETHASONE SODIUM PHOSPHATE 10 MG/ML IJ SOLN
10.0000 mg | Freq: Once | INTRAMUSCULAR | Status: AC
  Administered 2021-11-14: 10 mg
  Filled 2021-11-14: qty 1

## 2021-11-14 MED ORDER — LIDOCAINE HCL 2 % IJ SOLN
20.0000 mL | Freq: Once | INTRAMUSCULAR | Status: AC
  Administered 2021-11-14: 400 mg
  Filled 2021-11-14: qty 20

## 2021-11-14 NOTE — Patient Instructions (Signed)

## 2021-11-14 NOTE — Progress Notes (Signed)
PROVIDER NOTE: Interpretation of information contained herein should be left to medically-trained personnel. Specific patient instructions are provided elsewhere under "Patient Instructions" section of medical record. This document was created in part using STT-dictation technology, any transcriptional errors that may result from this process are unintentional.  ?Patient: Tammy Lamb ?Type: Established ?DOB: 26-Aug-1950 ?MRN: [id number] ?PCP: Gladstone Lighter, MD  Service: Procedure ?DOS: 11/14/2021 ?Setting: Ambulatory ?Location: Ambulatory outpatient facility ?Delivery: Face-to-face Provider: Gillis Santa, MD ?Specialty: Interventional Pain Management ?Specialty designation: 09 ?Location: Outpatient facility ?Ref. Prov.: Gladstone Lighter, MD   ? ?Primary Reason for Visit: Interventional Pain Management Treatment. ?CC: Back Pain (Lumbar and into the hips and groin on the left ) ? ?Procedure:          ? Inter-Laminar Thoracic Epidural Steroid Block/Injection  #1  ?Laterality:  Midline ?Level: T12-L1  ?Imaging: Fluoroscopic guidance ?Anesthesia: Local anesthesia (1-2% Lidocaine) ?Anxiolysis: None ?Sedation: None.  ?DOS: 11/14/2021 ?Performed by: Gillis Santa, MD ? ?Purpose: Diagnostic/Therapeutic ?Indications: Thoracic back pain, radicular pain, with degenerative disc disease severe enough to impact quality of life or function. ?1. Thoracic disc herniation   ?2. Radicular pain of thoracic region   ?3. Chronic pain syndrome   ? ?NAS-11 Pain score:  ? Pre-procedure: 6 /10  ? Post-procedure: 2 /10  ? ?  ?Position / Prep / Materials:  ?Position: Prone  ?Prep solution: DuraPrep (Iodine Povacrylex [0.7% available iodine] and Isopropyl Alcohol, 74% w/w) ?Prep Area: Posterior Thoracolumbar (Upper back from shoulders to lower lumbar region).  ?Materials:  ?Tray: Epidural ?Needle(s) Type: Epidural needle ?Gauge (G): 22 ?Length: Regular (3.5-in) ?Qty: 1 ?Pre-op H&P Assessment:  ?Tammy Lamb is a 71 y.o. (year old), female  patient, seen today for interventional treatment. She  has a past surgical history that includes Tonsillectomy; Cholecystectomy; Abdominal hysterectomy; Diagnostic laparoscopy; Bladder suspension; Colonoscopy; Upper gi endoscopy; Knee arthroscopy (Left, 02/13/2014); Ankle arthroscopy (Left, 03/22/2016); Tendon repair (Left, 03/22/2016); Lumbar laminectomy/decompression microdiscectomy (N/A, 05/30/2017); Anterior cervical decomp/discectomy fusion (N/A, 04/09/2019); and Lumbar laminectomy/decompression microdiscectomy (N/A, 05/27/2021). Tammy Lamb has a current medication list which includes the following prescription(s): acetaminophen, amitriptyline, amlodipine, ascorbic acid, atorvastatin, cephalexin, cholecalciferol, cinnamon, citalopram, fluticasone-salmeterol, gabapentin, hydrochlorothiazide, [START ON 12/02/2021] hydrocodone-acetaminophen, [START ON 01/01/2022] hydrocodone-acetaminophen, hydroxyzine, magnesium oxide, metformin, fish oil, omeprazole-sodium bicarbonate, oxybutynin, pentosan polysulfate sodium, polyethylene glycol, ramipril, sulfasalazine, tizanidine, valsartan, ventolin hfa, vitamin b-12, celecoxib, and metformin. Her primarily concern today is the Back Pain (Lumbar and into the hips and groin on the left ) ? ?Initial Vital Signs:  ?Pulse/HCG Rate: 67ECG Heart Rate: 64 ?Temp: (!) 97.2 ?F (36.2 ?C) ?Resp: 16 ?BP: (!) 150/97 ?SpO2: 97 % ? ?BMI: Estimated body mass index is 28.57 kg/m? as calculated from the following: ?  Height as of this encounter: 5' 6"  (1.676 m). ?  Weight as of this encounter: 177 lb (80.3 kg). ? ?Risk Assessment: ?Allergies: Reviewed. She is allergic to aspirin and sulfa antibiotics.  ?Allergy Precautions: None required ?Coagulopathies: Reviewed. None identified.  ?Blood-thinner therapy: None at this time ?Active Infection(s): Reviewed. None identified. Tammy Lamb is afebrile ? ?Site Confirmation: Tammy Lamb was asked to confirm the procedure and laterality before marking the  site ?Procedure checklist: Completed ?Consent: Before the procedure and under the influence of no sedative(s), amnesic(s), or anxiolytics, the patient was informed of the treatment options, risks and possible complications. To fulfill our ethical and legal obligations, as recommended by the [employer] Association's Code of Ethics, I have informed the patient of my clinical impression; the nature and purpose of the  treatment or procedure; the risks, benefits, and possible complications of the intervention; the alternatives, including doing nothing; the risk(s) and benefit(s) of the alternative treatment(s) or procedure(s); and the risk(s) and benefit(s) of doing nothing. ?The patient was provided information about the general risks and possible complications associated with the procedure. These may include, but are not limited to: failure to achieve desired goals, infection, bleeding, organ or nerve damage, allergic reactions, paralysis, and death. ?In addition, the patient was informed of those risks and complications associated to Spine-related procedures, such as failure to decrease pain; infection (i.e.: Meningitis, epidural or intraspinal abscess); bleeding (i.e.: epidural hematoma, subarachnoid hemorrhage, or any other type of intraspinal or peri-dural bleeding); organ or nerve damage (i.e.: Any type of peripheral nerve, nerve root, or spinal cord injury) with subsequent damage to sensory, motor, and/or autonomic systems, resulting in permanent pain, numbness, and/or weakness of one or several areas of the body; allergic reactions; (i.e.: anaphylactic reaction); and/or death. ?Furthermore, the patient was informed of those risks and complications associated with the medications. These include, but are not limited to: allergic reactions (i.e.: anaphylactic or anaphylactoid reaction(s)); adrenal axis suppression; blood sugar elevation that in diabetics may result in ketoacidosis or comma; water retention  that in patients with history of congestive heart failure may result in shortness of breath, pulmonary edema, and decompensation with resultant heart failure; weight gain; swelling or edema; medication-induced neural toxicity; particulate matter embolism and blood vessel occlusion with resultant organ, and/or nervous system infarction; and/or aseptic necrosis of one or more joints. ?Finally, the patient was informed that Medicine is not an exact science; therefore, there is also the possibility of unforeseen or unpredictable risks and/or possible complications that may result in a catastrophic outcome. The patient indicated having understood very clearly. We have given the patient no guarantees and we have made no promises. Enough time was given to the patient to ask questions, all of which were answered to the patient's satisfaction. Ms. Rau has indicated that she wanted to continue with the procedure. ?Attestation: I, the ordering provider, attest that I have discussed with the patient the benefits, risks, side-effects, alternatives, likelihood of achieving goals, and potential problems during recovery for the procedure that I have provided informed consent. ?Date  Time: 11/14/2021 10:34 AM ? ?Pre-Procedure Preparation:  ?Monitoring: As per clinic protocol. Respiration, ETCO2, SpO2, BP, heart rate and rhythm monitor placed and checked for adequate function ?Safety Precautions: Patient was assessed for positional comfort and pressure points before starting the procedure. ?Time-out: I initiated and conducted the "Time-out" before starting the procedure, as per protocol. The patient was asked to participate by confirming the accuracy of the "Time Out" information. Verification of the correct person, site, and procedure were performed and confirmed by me, the nursing staff, and the patient. "Time-out" conducted as per Joint Commission's Universal Protocol (UP.01.01.01). ?Time: 1101 ? ?Description of Procedure:           ?Target Area: For Epidural Steroid injection(s), the target area is the  interlaminar space, initially targeting the lower border of the superior vertebral body lamina. ?Approach: Interlaminar approach. ?Area Prepped

## 2021-11-14 NOTE — Progress Notes (Signed)
Safety precautions to be maintained throughout the outpatient stay will include: orient to surroundings, keep bed in low position, maintain call bell within reach at all times, provide assistance with transfer out of bed and ambulation.  

## 2021-11-15 ENCOUNTER — Ambulatory Visit
Admission: RE | Admit: 2021-11-15 | Discharge: 2021-11-15 | Disposition: A | Discharge status: Home / Self Care | Payer: Medicare Other | Source: Ambulatory Visit | Attending: Podiatry | Admitting: Podiatry

## 2021-11-15 ENCOUNTER — Telehealth: Payer: Self-pay

## 2021-11-15 DIAGNOSIS — G5782 Other specified mononeuropathies of left lower limb: Secondary | ICD-10-CM | POA: Diagnosis not present

## 2021-11-15 DIAGNOSIS — M25572 Pain in left ankle and joints of left foot: Secondary | ICD-10-CM

## 2021-11-15 NOTE — Telephone Encounter (Signed)
Post procedure phone call.  Patient states she is doing well.  

## 2021-12-26 ENCOUNTER — Ambulatory Visit
Admission: RE | Admit: 2021-12-26 | Discharge: 2021-12-26 | Disposition: A | Discharge status: Home / Self Care | Payer: Medicare Other | Attending: Student in an Organized Health Care Education/Training Program | Admitting: Student in an Organized Health Care Education/Training Program

## 2021-12-26 ENCOUNTER — Ambulatory Visit (HOSPITAL_BASED_OUTPATIENT_CLINIC_OR_DEPARTMENT_OTHER): Payer: Medicare Other | Admitting: Student in an Organized Health Care Education/Training Program

## 2021-12-26 ENCOUNTER — Encounter: Payer: Self-pay | Admitting: Student in an Organized Health Care Education/Training Program

## 2021-12-26 ENCOUNTER — Ambulatory Visit
Admission: RE | Admit: 2021-12-26 | Discharge: 2021-12-26 | Disposition: A | Discharge status: Home / Self Care | Payer: Medicare Other | Source: Ambulatory Visit | Attending: Student in an Organized Health Care Education/Training Program | Admitting: Student in an Organized Health Care Education/Training Program

## 2021-12-26 VITALS — BP 132/95 | HR 70 | Temp 97.3°F | Resp 18 | Ht 65.0 in | Wt 178.0 lb

## 2021-12-26 DIAGNOSIS — G894 Chronic pain syndrome: Secondary | ICD-10-CM | POA: Insufficient documentation

## 2021-12-26 DIAGNOSIS — M5124 Other intervertebral disc displacement, thoracic region: Secondary | ICD-10-CM

## 2021-12-26 DIAGNOSIS — M25552 Pain in left hip: Secondary | ICD-10-CM | POA: Insufficient documentation

## 2021-12-26 DIAGNOSIS — M5030 Other cervical disc degeneration, unspecified cervical region: Secondary | ICD-10-CM

## 2021-12-26 DIAGNOSIS — G8929 Other chronic pain: Secondary | ICD-10-CM

## 2021-12-26 DIAGNOSIS — M25551 Pain in right hip: Secondary | ICD-10-CM | POA: Insufficient documentation

## 2021-12-26 DIAGNOSIS — M48062 Spinal stenosis, lumbar region with neurogenic claudication: Secondary | ICD-10-CM | POA: Insufficient documentation

## 2021-12-26 DIAGNOSIS — M5412 Radiculopathy, cervical region: Secondary | ICD-10-CM | POA: Insufficient documentation

## 2021-12-26 DIAGNOSIS — M4692 Unspecified inflammatory spondylopathy, cervical region: Secondary | ICD-10-CM | POA: Insufficient documentation

## 2021-12-26 DIAGNOSIS — M5414 Radiculopathy, thoracic region: Secondary | ICD-10-CM

## 2021-12-26 MED ORDER — KETOROLAC TROMETHAMINE 30 MG/ML IJ SOLN
INTRAMUSCULAR | Status: AC
  Filled 2021-12-26: qty 1

## 2021-12-26 MED ORDER — ORPHENADRINE CITRATE 30 MG/ML IJ SOLN
INTRAMUSCULAR | Status: AC
  Filled 2021-12-26: qty 2

## 2021-12-26 MED ORDER — GABAPENTIN 300 MG PO CAPS: 900.0000 mg | ORAL_CAPSULE | Freq: Two times a day (BID) | ORAL | 2 refills | Status: DC

## 2021-12-26 MED ORDER — HYDROCODONE-ACETAMINOPHEN 10-325 MG PO TABS: 1.0000 | ORAL_TABLET | Freq: Three times a day (TID) | ORAL | 0 refills | Status: DC | PRN

## 2021-12-26 MED ORDER — KETOROLAC TROMETHAMINE 30 MG/ML IJ SOLN
30.0000 mg | Freq: Once | INTRAMUSCULAR | Status: AC
  Administered 2021-12-26: 30 mg via INTRAMUSCULAR

## 2021-12-26 MED ORDER — ORPHENADRINE CITRATE 30 MG/ML IJ SOLN
30.0000 mg | Freq: Once | INTRAMUSCULAR | Status: AC
  Administered 2021-12-26: 30 mg via INTRAMUSCULAR

## 2021-12-26 NOTE — Progress Notes (Signed)
PROVIDER NOTE: Information contained herein reflects review and annotations entered in association with encounter. Interpretation of such information and data should be left to medically-trained personnel. Information provided to patient can be located elsewhere in the medical record under "Patient Instructions". Document created using STT-dictation technology, any transcriptional errors that may result from process are unintentional.    Patient: Tammy Lamb  Service Category: E/M  Provider: Gillis Santa, MD  DOB: May 01, 1951  DOS: 12/26/2021  Specialty: Interventional Pain Management  MRN: [id number]  Setting: Ambulatory outpatient  PCP: Gladstone Lighter, MD  Type: Established Patient    Referring Provider: Gladstone Lighter, MD  Location: Office  Delivery: Face-to-face     HPI  Ms. Demaya D Hauss, a 71 y.o. year old female, is here today because of her Chronic radicular cervical pain [M54.12, G89.29]. Ms. Beg primary complain today is Back Pain and Hip Pain (left) Last encounter: My last encounter with her was on 11/14/21 Pertinent problems: Ms. 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 Pain Assessment: Severity of Chronic pain is reported as a 6 /10. Location: Back Lower/radiates down the side of left leg to toes, pinky toe is numb. Onset: More than a month ago. Quality: Cramping, Stabbing (weakness). Timing: Constant. Modifying factor(s): medication. Vitals:  height is _0  (1.651 m) and weight is 178 lb (80.7 kg). Her  temperature is 97.3 F (36.3 C) (abnormal). Her blood pressure is 132/95 (abnormal) and her pulse is 70. Her respiration is 18 and oxygen saturation is 98%.   Reason for encounter: medication management.   Patient follows up today for medication management.  Unfortunately she sustained a fall as she was watering her plants and is having increased pain in her lower lumbar spine in the midline and slightly more pronounced on the right.  She is also having increased left hip pain and left knee pain with associated left knee edema.  No bowel or bladder dysfunction.  Full lower extremity strength.  We will obtain x-rays given recent fall.  I will also refill her hydrocodone and gabapentin as below.  Unfortunately no benefit from thoracic ESI that was done last time.  UDS up-to-date and appropriate.  Pharmacotherapy Assessment  Analgesic: Hydrocodone 50m TID prn 90/month     Monitoring: [place] PMP: PDMP reviewed during this encounter.       Pharmacotherapy: No side-effects or adverse reactions reported. Compliance: No problems identified. Effectiveness: Clinically acceptable.  TDewayne Shorter RN  12/26/2021  1:19 PM  Sign when Signing Visit Nursing Pain Medication Assessment:  Safety precautions to be maintained throughout the outpatient stay will include: orient to surroundings, keep bed in low position, maintain call bell within reach at all times, provide assistance with transfer out of bed and ambulation.  Medication Inspection Compliance: Pill count conducted under aseptic conditions, in front of the patient. Neither the pills nor the bottle was removed from the patient's sight at any time. Once count was completed pills were immediately returned to the patient in their original bottle.  Medication: Hydrocodone/APAP Pill/Patch Count:  73 of 90 pills remain Pill/Patch Appearance: Markings consistent with prescribed medication Bottle Appearance: Standard pharmacy container. Clearly labeled. Filled  Date:  05 / 26 / 2023 Last Medication intake:  Today     UDS:  Summary  Date Value Ref Range Status  08/25/2021 Note  Final    Comment:    ==================================================================== [employer] [facility] (MW) ==================================================================== Test                             Result       Flag       Units  Drug Present and Declared for Prescription Verification   Hydrocodone                    512          EXPECTED   ng/mg creat   Norhydrocodone                 2435         EXPECTED   ng/mg creat    Sources of hydrocodone include scheduled prescription medications.    Norhydrocodone is an expected metabolite of hydrocodone.  ==================================================================== Test                      Result    Flag   Units      Ref Range   Creatinine              34               mg/dL      >=20 ==================================================================== Declared Medications:  The flagging and interpretation on this report are based on the  following declared medications.  Unexpected results may arise from  inaccuracies in the declared medications.   **Note: The testing scope of this panel includes these medications:   Hydrocodone (Norco)   **Note: The testing scope of this panel does not include the  following reported medications:   Acetaminophen (Tylenol)  Acetaminophen (Norco)  Albuterol  Amitriptyline (Elavil)  Amlodipine (Norvasc)  Atorvastatin (Lipitor)  Celecoxib (Celebrex)  Cephalexin (Keflex)  Cholecalciferol  Cinnamon  Citalopram (Celexa)  Cyanocobalamin  Fluticasone (Advair)  Gabapentin (Neurontin)  Hydrochlorothiazide  Hydroxyzine (Atarax)  Magnesium (Mag-Ox)  Metformin  Omeprazole  Oxybutynin  Pentosan  Polyethylene Glycol (MiraLAX)  Salmeterol (Advair)  Sodium Bicarbonate  Sulfasalazine (Azulfidine)  Tizanidine  Valsartan (Diovan)  Vitamin  C ==================================================================== For clinical consultation, please call [phone]. ====================================================================      ROS  Constitutional: Denies any fever or chills Gastrointestinal: No reported hemesis, hematochezia, vomiting, or acute GI distress Musculoskeletal: Low back, left hip, left greater than right knee pain Neurological: No reported episodes of acute onset apraxia, aphasia, dysarthria, agnosia, amnesia, paralysis, loss of coordination, or loss of consciousness  Medication Review  Cinnamon, DULoxetine, Fish Oil, HYDROcodone-acetaminophen, Omeprazole-Sodium Bicarbonate, Pentosan Polysulfate Sodium, acetaminophen, albuterol, amLODipine, amitriptyline, ascorbic acid, atorvastatin, cephALEXin, cholecalciferol, citalopram, fluticasone-salmeterol, gabapentin, hydrOXYzine, hydrochlorothiazide, magnesium oxide, metFORMIN, oxybutynin, polyethylene glycol, ramipril, sulfaSALAzine, tiZANidine, valsartan, and vitamin B-12  History Review  Allergy: Ms. Pancake is allergic to aspirin and sulfa antibiotics. Drug: Ms. Stirewalt  reports no history of drug use. Alcohol:  reports that she does not currently use alcohol. Tobacco:  reports that she has been smoking cigarettes. She has a 24.50 pack-year smoking history. She has never used smokeless tobacco. Social: Ms. Bahena  reports that she has been smoking cigarettes. She has a 24.50 pack-year smoking history. She has never used smokeless tobacco. She reports that she does not currently use alcohol. She reports that she does not  use drugs. Medical:  has a past medical history of Anemia, Anxiety, Arthritis, Bilateral carotid artery stenosis, Chronic lower back pain, Colitis, ulcerative ([place]), COPD (chronic obstructive pulmonary disease) ([place]), Depression, Diabetes mellitus without complication ([place]), GERD (gastroesophageal reflux disease), H/O bladder infections, History of  2019 novel coronavirus disease (COVID-19) (02/2021), History of hiatal hernia, HLD (hyperlipidemia), Hypertension, Interstitial cystitis, Low sodium levels, Motion sickness, Parkinson's disease ([place]), PVD (peripheral vascular disease) ([place]), Sinus headache, Stroke ([place]) (2015), TIA (transient ischemic attack) (2015), Urinary frequency, and Wears dentures. Surgical: Ms. Wehling  has a past surgical history that includes Tonsillectomy; Cholecystectomy; Abdominal hysterectomy; Diagnostic laparoscopy; Bladder suspension; Colonoscopy; Upper gi endoscopy; Knee arthroscopy (Left, 02/13/2014); Ankle arthroscopy (Left, 03/22/2016); Tendon repair (Left, 03/22/2016); Lumbar laminectomy/decompression microdiscectomy (N/A, 05/30/2017); Anterior cervical decomp/discectomy fusion (N/A, 04/09/2019); and Lumbar laminectomy/decompression microdiscectomy (N/A, 05/27/2021). Family: family history includes COPD in her father; Diabetes in her mother; Heart disease in her father; Hypertension in her father and mother.  Laboratory Chemistry Profile   Renal Lab Results  Component Value Date   BUN 9 05/23/2021   CREATININE 0.73 05/23/2021   GFRAA >60 04/02/2019   GFRNONAA >60 05/23/2021    Hepatic No results found for: AST, ALT, ALBUMIN, ALKPHOS, HCVAB, AMYLASE, LIPASE, AMMONIA  Electrolytes Lab Results  Component Value Date   NA 133 (L) 05/23/2021   K 3.9 05/23/2021   CL 95 (L) 05/23/2021   CALCIUM 9.0 05/23/2021    Bone No results found for: VD25OH, HA193XT0WIO, XB3532DJ2, EQ6834HD6, 25OHVITD1, 25OHVITD2, 25OHVITD3, TESTOFREE, TESTOSTERONE  Inflammation (CRP: Acute Phase) (ESR: Chronic Phase) No results found for: CRP, ESRSEDRATE, LATICACIDVEN       Note: Above Lab results reviewed.   Physical Exam  General appearance: Well nourished, well developed, and well hydrated. In no apparent acute distress Mental status: Alert, oriented x 3 (person, place, & time)       Respiratory: No evidence of acute respiratory  distress Eyes: PERLA Vitals: BP (!) 132/95   Pulse 70   Temp (!) 97.3 F (36.3 C)   Resp 18   Ht _0  (1.651 m)   Wt 178 lb (80.7 kg)   SpO2 98%   BMI 29.62 kg/m  BMI: Estimated body mass index is 29.62 kg/m as calculated from the following:   Height as of this encounter: _1  (1.651 m).   Weight as of this encounter: 178 lb (80.7 kg). Ideal: Ideal body weight: 57 kg (125 lb 10.6 oz) Adjusted ideal body weight: 66.5 kg (146 lb 9.6 oz)  Thoracic Spine Area Exam  Skin & Axial Inspection: No masses, redness, or swelling Alignment: Symmetrical Functional ROM: Pain restricted ROM Stability: No instability detected Muscle Tone/Strength: Functionally intact. No obvious neuro-muscular anomalies detected. Sensory (Neurological): Neurogenic pain pattern Muscle strength & Tone: No palpable anomalies Lumbar Spine Area Exam  Skin & Axial Inspection: No masses, redness, or swelling Alignment: Symmetrical Functional ROM: Pain restricted ROM  Stability: No instability detected Muscle Tone/Strength: Functionally intact. No obvious neuro-muscular anomalies detected. Sensory (Neurological): Neurogenic pain pattern left lower extremity  Gait & Posture Assessment  Ambulation: Unassisted Gait: Antalgic Posture: Difficulty standing up straight, due to pain  Lower Extremity Exam    Side: Right lower extremity  Side: Left lower extremity  Stability: No instability observed          Stability: No instability observed          Skin & Extremity Inspection: Skin color, temperature, and hair growth are WNL. No peripheral edema or cyanosis. No masses,  redness, swelling, asymmetry, or associated skin lesions. No contractures.  Skin & Extremity Inspection: Skin color, temperature, and hair growth are WNL. No peripheral edema or cyanosis. No masses, redness, swelling, asymmetry, or associated skin lesions. No contractures.  Functional ROM: Pain restricted ROM for hip joint          Functional ROM: Pain  restricted ROM for hip joint          Muscle Tone/Strength: Functionally intact. No obvious neuro-muscular anomalies detected.  Muscle Tone/Strength: Functionally intact. No obvious neuro-muscular anomalies detected.  Sensory (Neurological): Neurogenic pain pattern        Sensory (Neurological): Neurogenic pain pattern        DTR: Patellar: deferred today Achilles: deferred today Plantar: deferred today  DTR: Patellar: deferred today Achilles: deferred today Plantar: deferred today  Palpation: No palpable anomalies  Palpation: No palpable anomalies    Assessment   Status Diagnosis  Having a Flare-up Having a Flare-up Having a Flare-up 1. Chronic radicular cervical pain   2. Spinal stenosis, lumbar region, with neurogenic claudication   3. DDD (degenerative disc disease), cervical   4. Bilateral hip pain   5. Chronic pain syndrome   6. Thoracic disc herniation   7. Radicular pain of thoracic region   8. Inflammatory spondylopathy of cervical region [facility])        Plan of Care   Ms. Niambi D Blatchford has a current medication list which includes the following long-term medication(s): amlodipine, atorvastatin, duloxetine, fluticasone-salmeterol, hydrochlorothiazide, [START ON 01/01/2022] hydrocodone-acetaminophen, metformin, sulfasalazine, tizanidine, valsartan, gabapentin, [START ON 01/15/2022] hydrocodone-acetaminophen, [START ON 02/14/2022] hydrocodone-acetaminophen, [START ON 03/16/2022] hydrocodone-acetaminophen, and metformin.  Pharmacotherapy (Medications Ordered): Meds ordered this encounter  Medications   HYDROcodone-acetaminophen (NORCO) 10-325 MG tablet    Sig: Take 1 tablet by mouth every 8 (eight) hours as needed for severe pain.    Dispense:  90 tablet    Refill:  0   HYDROcodone-acetaminophen (NORCO) 10-325 MG tablet    Sig: Take 1 tablet by mouth every 8 (eight) hours as needed for severe pain.    Dispense:  90 tablet    Refill:  0   HYDROcodone-acetaminophen (NORCO)  10-325 MG tablet    Sig: Take 1 tablet by mouth every 8 (eight) hours as needed for severe pain.    Dispense:  90 tablet    Refill:  0   ketorolac (TORADOL) 30 MG/ML injection 30 mg   orphenadrine (NORFLEX) injection 30 mg   gabapentin (NEURONTIN) 300 MG capsule    Sig: Take 3 capsules (900 mg total) by mouth 2 (two) times daily.    Dispense:  180 capsule    Refill:  2    Orders:  Orders Placed This Encounter  Procedures   DG Lumbar Spine Complete W/Bend    Patient presents with axial pain with possible radicular component. Please assist [place] in identifying specific level(s) and laterality of any additional findings such as: 1. Facet (Zygapophyseal) joint DJD (Hypertrophy, space narrowing, subchondral sclerosis, and/or osteophyte formation) 2. DDD and/or IVDD (Loss of disc height, desiccation, gas patterns, osteophytes, endplate sclerosis, or "Black disc disease") 3. Pars defects 4. Spondylolisthesis, spondylosis, and/or spondyloarthropathies (include Degree/Grade of displacement in mm) (stability) 5. Vertebral body Fractures (acute/chronic) (state percentage of collapse) 6. Demineralization (osteopenia/osteoporotic) 7. Bone pathology 8. Foraminal narrowing  9. Surgical changes    Standing Status:   Future    Standing Expiration Date:   01/25/2022    Scheduling Instructions:     Imaging must be done  as soon as possible. Inform patient that order will expire within 30 days and I will not renew it.    Order Specific Question:   Reason for Exam (SYMPTOM  OR DIAGNOSIS REQUIRED)    Answer:   Low back pain    Order Specific Question:   Preferred imaging location?    Answer:   [facility]    Order Specific Question:   Call Results- Best Contact Number?    Answer:   [phone] ([facility])    Order Specific Question:   Radiology Contrast Protocol - do NOT remove file path    Answer:   [email or web address]    Order Specific Question:    Release to patient    Answer:   Immediate   DG Knee 1-2 Views Right    Standing Status:   Future    Standing Expiration Date:   01/25/2022    Scheduling Instructions:     Imaging must be done as soon as possible. Inform patient that order will expire within 30 days and I will not renew it.    Order Specific Question:   Reason for Exam (SYMPTOM  OR DIAGNOSIS REQUIRED)    Answer:   Right knee pain/arthralgia    Order Specific Question:   Preferred imaging location?    Answer:   [facility]    Order Specific Question:   Call Results- Best Contact Number?    Answer:   [phone]) [phone] ([facility])    Order Specific Question:   Release to patient    Answer:   Immediate   DG Knee 1-2 Views Left    Standing Status:   Future    Standing Expiration Date:   01/25/2022    Scheduling Instructions:     Imaging must be done as soon as possible. Inform patient that order will expire within 30 days and I will not renew it.    Order Specific Question:   Reason for Exam (SYMPTOM  OR DIAGNOSIS REQUIRED)    Answer:   Right knee pain/arthralgia    Order Specific Question:   Preferred imaging location?    Answer:   [facility]    Order Specific Question:   Call Results- Best Contact Number?    Answer:   [phone] ([facility])    Order Specific Question:   Release to patient    Answer:   Immediate   DG HIP UNILAT W OR W/O PELVIS 2-3 VIEWS RIGHT    Please describe any evidence of DJD, such as joint narrowing, asymmetry, cysts, or any anomalies in bone density, production, or erosion.    Standing Status:   Future    Standing Expiration Date:   01/25/2022    Scheduling Instructions:     Imaging must be done as soon as possible. Inform patient that order will expire within 30 days and I will not renew it.    Order Specific Question:   Reason for Exam (SYMPTOM  OR DIAGNOSIS REQUIRED)    Answer:   Right knee pain/arthralgia    Order Specific Question:   Preferred imaging location?     Answer:   [facility]    Order Specific Question:   Call Results- Best Contact Number?    Answer:   [phone]) [phone] ([facility])    Order Specific Question:   Release to patient    Answer:   Immediate   DG HIP UNILAT W OR W/O PELVIS 2-3 VIEWS LEFT    Please describe any  evidence of DJD, such as joint narrowing, asymmetry, cysts, or any anomalies in bone density, production, or erosion.    Standing Status:   Future    Standing Expiration Date:   01/25/2022    Scheduling Instructions:     Imaging must be done as soon as possible. Inform patient that order will expire within 30 days and I will not renew it.    Order Specific Question:   Reason for Exam (SYMPTOM  OR DIAGNOSIS REQUIRED)    Answer:   Right knee pain/arthralgia    Order Specific Question:   Preferred imaging location?    Answer:   [facility]    Order Specific Question:   Call Results- Best Contact Number?    Answer:   [phone]) [phone] [facility])   Follow-up plan:   Return in about 3 months (around 04/06/2022) for Medication Management, in person.    Recent Visits Date Type Provider Dept  11/14/21 Procedure visit Gillis Santa, MD [facility]  11/01/21 Office Visit Gillis Santa, MD [facility]  Showing recent visits within past 90 days and meeting all other requirements Today's Visits Date Type Provider Dept  12/26/21 Office Visit Gillis Santa, MD [facility]  Showing today's visits and meeting all other requirements Future Appointments No visits were found meeting these conditions. Showing future appointments within next 90 days and meeting all other requirements  I discussed the assessment and treatment plan with the patient. The patient was provided an opportunity to ask questions and all were answered. The patient agreed with the plan and demonstrated an understanding of the instructions.  Patient advised to call back or seek an in-person evaluation if the symptoms  or condition worsens.  Duration of encounter: 2mnutes.  Note by: BGillis Santa MD Date: 12/26/2021; Time: 1:39 PM

## 2021-12-26 NOTE — Progress Notes (Signed)
Nursing Pain Medication Assessment:  Safety precautions to be maintained throughout the outpatient stay will include: orient to surroundings, keep bed in low position, maintain call bell within reach at all times, provide assistance with transfer out of bed and ambulation.  Medication Inspection Compliance: Pill count conducted under aseptic conditions, in front of the patient. Neither the pills nor the bottle was removed from the patient's sight at any time. Once count was completed pills were immediately returned to the patient in their original bottle.  Medication: Hydrocodone/APAP Pill/Patch Count:  73 of 90 pills remain Pill/Patch Appearance: Markings consistent with prescribed medication Bottle Appearance: Standard pharmacy container. Clearly labeled. Filled Date: 05 / 26 / 2023 Last Medication intake:  Today

## 2022-01-03 ENCOUNTER — Telehealth: Payer: Self-pay | Admitting: Student in an Organized Health Care Education/Training Program

## 2022-01-03 NOTE — Telephone Encounter (Signed)
Pt wants to know her xrays results, xrays were done 12-26-21. Pt stated she doesn't have mychart. Please give patient a call. Thanks

## 2022-01-03 NOTE — Telephone Encounter (Signed)
Xray impressions read to patient.

## 2022-01-27 ENCOUNTER — Other Ambulatory Visit: Payer: Self-pay | Admitting: Student in an Organized Health Care Education/Training Program

## 2022-01-27 DIAGNOSIS — M5124 Other intervertebral disc displacement, thoracic region: Secondary | ICD-10-CM

## 2022-01-27 DIAGNOSIS — G894 Chronic pain syndrome: Secondary | ICD-10-CM

## 2022-01-27 DIAGNOSIS — M4692 Unspecified inflammatory spondylopathy, cervical region: Secondary | ICD-10-CM

## 2022-01-27 DIAGNOSIS — M5414 Radiculopathy, thoracic region: Secondary | ICD-10-CM

## 2022-01-31 ENCOUNTER — Telehealth: Payer: Self-pay | Admitting: Student in an Organized Health Care Education/Training Program

## 2022-01-31 NOTE — Telephone Encounter (Signed)
Patient will like refill to be send to [facility]. Please give patient. Thanks

## 2022-01-31 NOTE — Telephone Encounter (Signed)
Spoke with patient and she states that [facility] does not have her prescriptions.  There were 3 scripts sent in June.  She is going to call again and recheck and see if they have the prescription and the supply and will call us back.  If they do not have the supply, she would like script sent to [facility].

## 2022-02-01 NOTE — Telephone Encounter (Signed)
Patient got her meds and I can remove this message.

## 2022-02-14 ENCOUNTER — Telehealth: Payer: Self-pay

## 2022-02-14 NOTE — Telephone Encounter (Signed)
She wants to know if Dr. Holley Raring will write a script for Medicare for a flex top King size adjustable frame for a sleep number bed. She has a customer quote if you need to see it.

## 2022-02-26 IMAGING — MR MR THORACIC SPINE W/O CM
5 of 6 series · 28 of 48 positions shown · non-contrast
Comparison: 12/02/2019

CLINICAL DATA: Chronic thoracic region back pain with bilateral leg
numbness.

EXAM:
MRI THORACIC SPINE WITHOUT CONTRAST
TECHNIQUE: Multiplanar, multisequence MR imaging of the thoracic spine was
performed. No intravenous contrast was administered.

[Series 18: T1 · sagittal · 6.0mm · 1.88mm/px · 2 of 9 slices shown (1 of 2)]
[im 1/9]
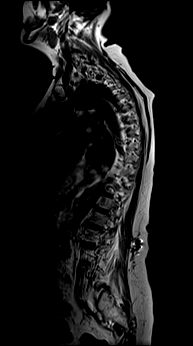
[im 9/9]
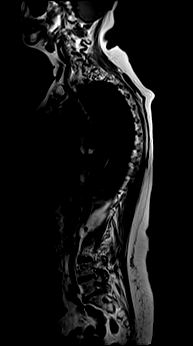

[Series 19: T2 · sagittal · 3.0mm · 1.06mm/px · 6 of 17 slices shown (1 of 2)]
[im 1/17]
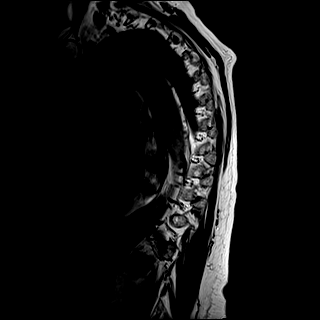
[im 4/17]
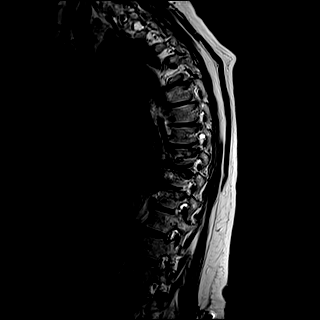
[im 7/17]
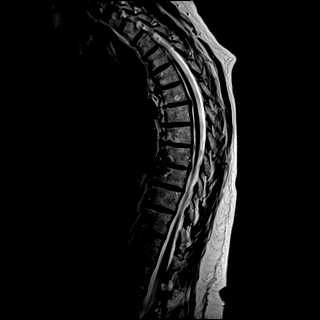
[im 10/17]
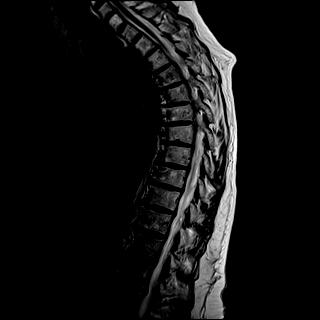
[im 13/17]
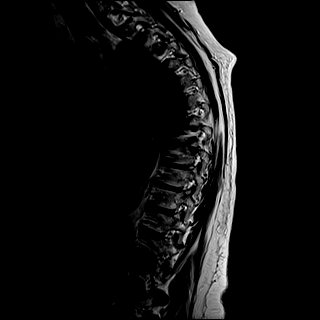
[im 17/17]
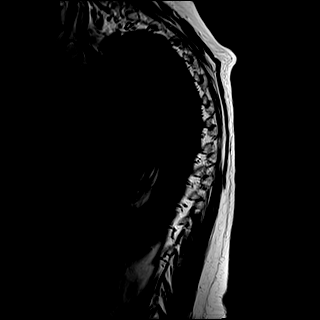

[Series 20: T1 · sagittal · 3.0mm · 1.06mm/px · 6 of 17 slices shown (2 of 2)]
[im 1/17]
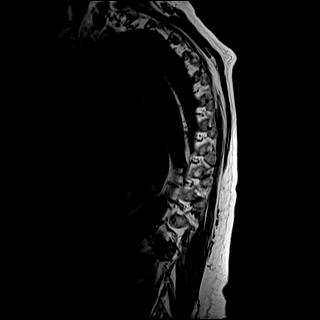
[im 4/17]
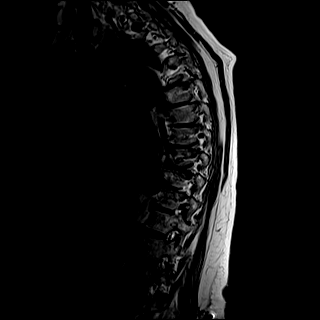
[im 7/17]
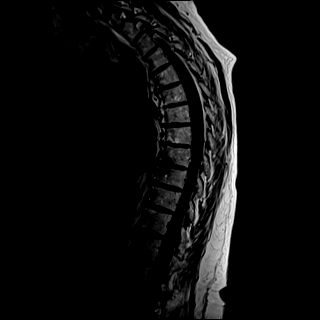
[im 10/17]
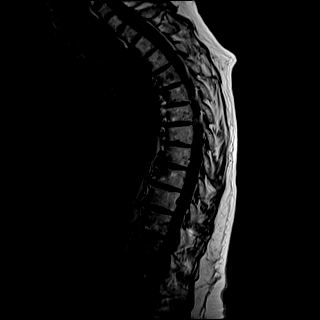
[im 13/17]
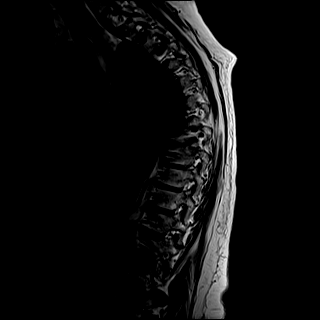
[im 17/17]
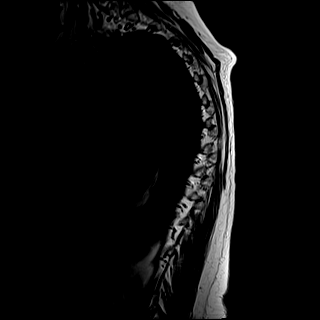

[Series 21: STIR · sagittal · 3.0mm · 0.53mm/px · 6 of 17 slices shown]
[im 1/17]
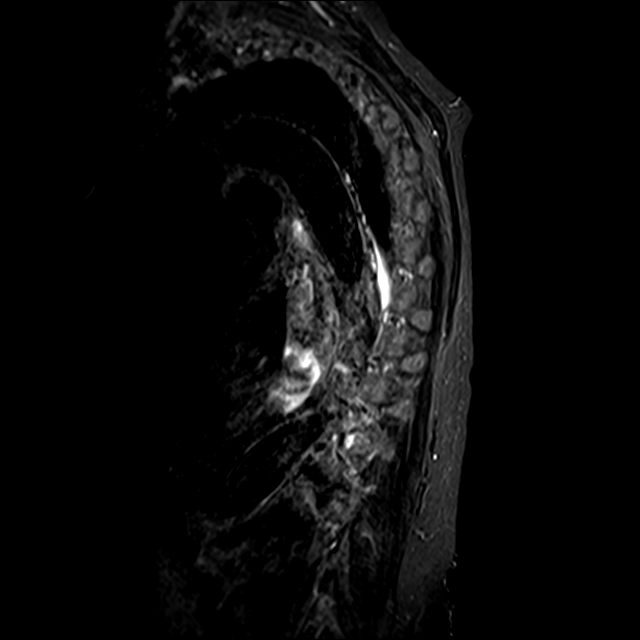
[im 4/17]
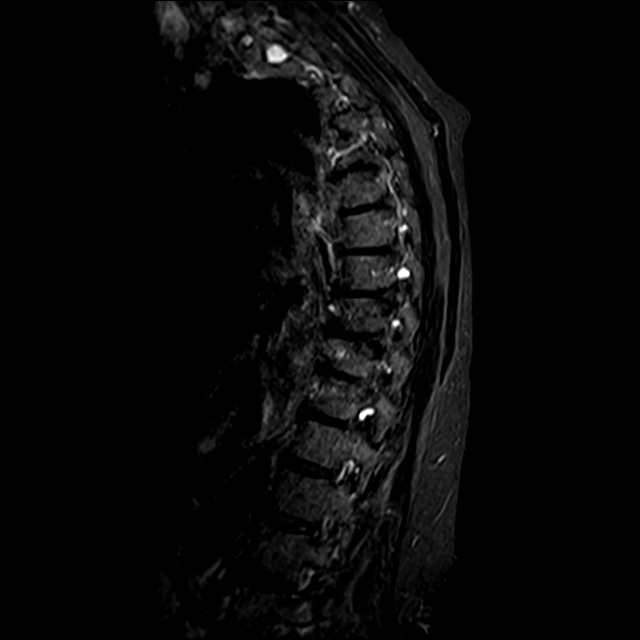
[im 7/17]
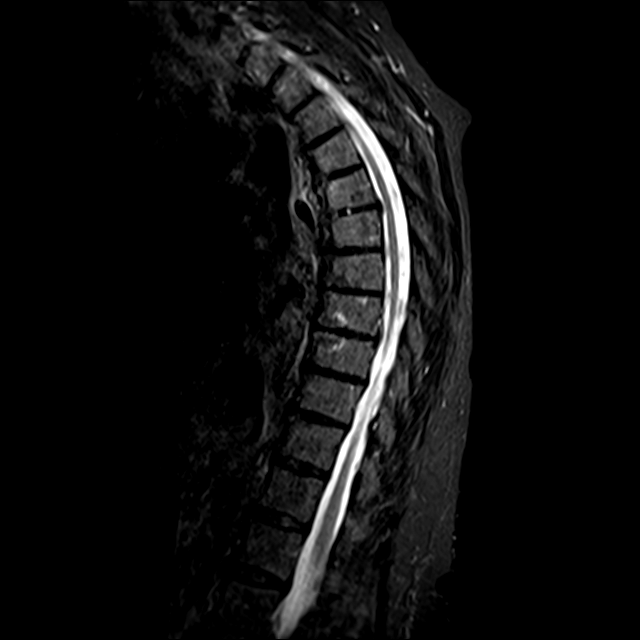
[im 10/17]
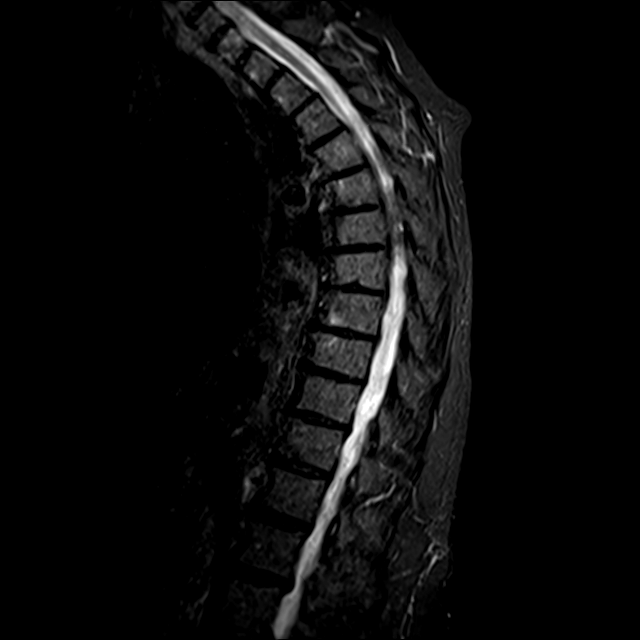
[im 13/17]
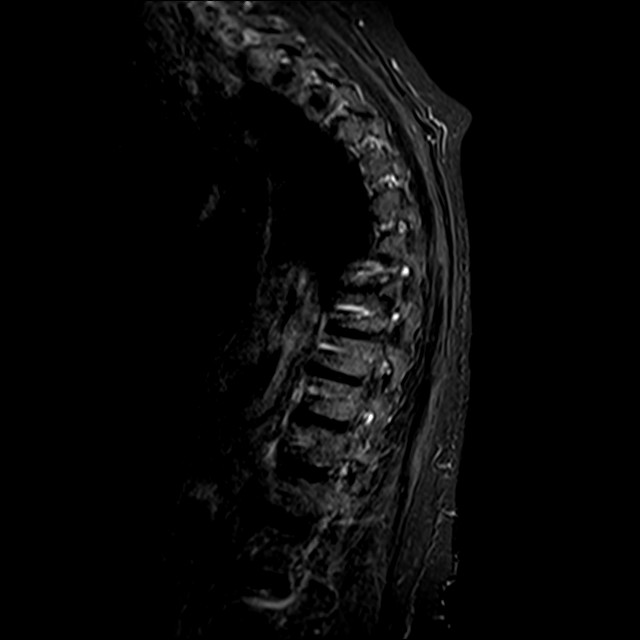
[im 17/17]
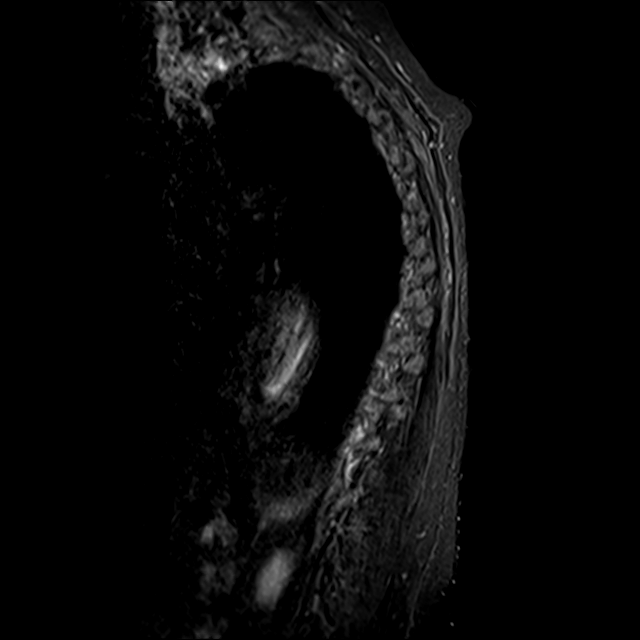

[Series 22: T2 · axial · 4.0mm · 0.59mm/px · z∈[-284,-111]mm · 8 of 39 slices shown (2 of 2)]
[im 1/39]
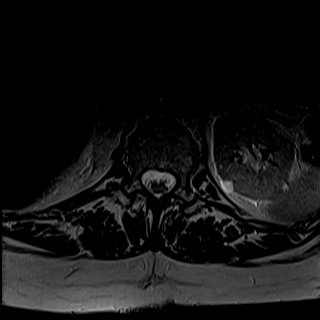
[im 6/39]
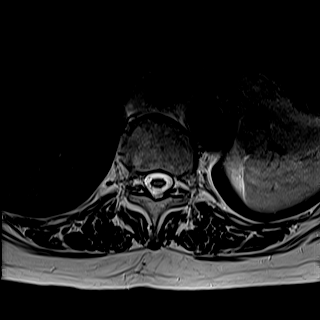
[im 12/39]
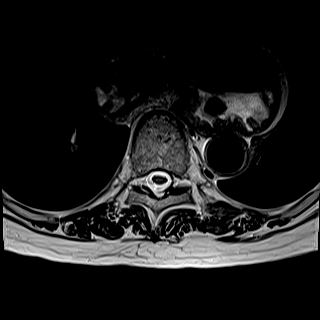
[im 18/39]
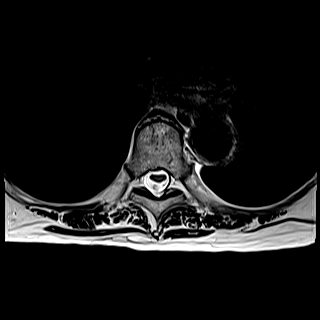
[im 21/39]
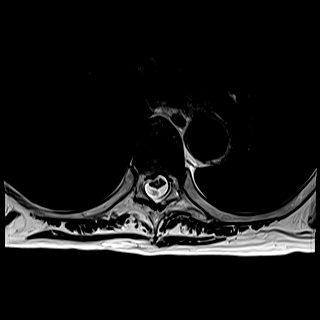
[im 27/39]
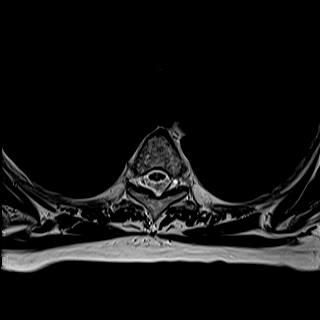
[im 33/39]
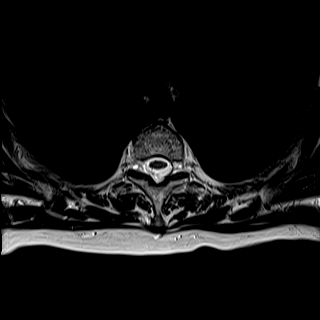
[im 39/39]
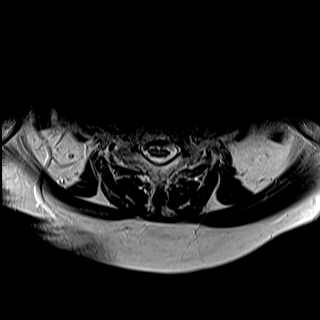

[28 of 48 positions shown; findings below may reference images not displayed]

FINDINGS: Alignment:  Exaggerated thoracic region kyphosis as seen previously.

Vertebrae: No acute fracture or focal bone lesion. Discogenic
endplate marrow changes from T4-5 through T8-9, with mild edema at
the T8-9 level.

Cord:  No primary cord lesion.  See below regarding stenosis.

Paraspinal and other soft tissues: Large hiatal hernia.

Disc levels:

No significant finding from T1-2 through T4-5.

T5-6: Small central disc herniation effaces the ventral subarachnoid
space and indents the ventral cord slightly. Ample subarachnoid
space present dorsal to the cord.

T6-7: Central to right-sided disc herniation and osteophyte effaces
the ventral subarachnoid space and indents the right side of the
cord slightly. Ample subarachnoid space present dorsal to the cord.

T7-8: Central disc herniation effaces the ventral subarachnoid space
and indents the ventral cord slightly. Ample subarachnoid space
present dorsal to the cord.

T8-9: Central disc herniation effaces the ventral subarachnoid space
and indents the ventral cord slightly. Ample subarachnoid space
present dorsal to the cord.

T9-10: Mild bulging of the disc.  No compressive stenosis.

T10-11: Mild bulging of the disc. Facet osteoarthritis. No
compressive stenosis.

T11-12: Minimal disc bulge and mild facet osteoarthritis. No
stenosis.

T12-L1: Minimal disc bulge.  No stenosis.

Compared to the study of 12/02/2019, the disc pathology and stenosis
appears quite similar.
IMPRESSION: No significant change since 12/02/2019. Exaggerated kyphotic
curvature of the spine. Chronic disc herniations from T5-6 through
T8-9 with effacement of the ventral subarachnoid space and
indentation of the ventral cord as outlined above. There is ample
subarachnoid space present dorsal to the cord at these levels.

Large hiatal hernia.

Chronic endplate marrow changes in the midthoracic region, with mild
edema at the T8-9 level, which could relate to back pain.

## 2022-03-06 ENCOUNTER — Telehealth: Payer: Self-pay | Admitting: Student in an Organized Health Care Education/Training Program

## 2022-03-06 NOTE — Telephone Encounter (Signed)
Patient will be out of town when she is due to fill scripts and wants to know if she can get it filled early. Please call her

## 2022-03-10 NOTE — Telephone Encounter (Signed)
Sorry to send this again but, did anyone call this patient. It isnt showing up on my msg log. Thanks.

## 2022-03-13 ENCOUNTER — Telehealth: Payer: Self-pay

## 2022-03-13 ENCOUNTER — Other Ambulatory Visit: Payer: Self-pay | Admitting: *Deleted

## 2022-03-13 NOTE — Telephone Encounter (Signed)
LM for patient to call office

## 2022-03-13 NOTE — Telephone Encounter (Signed)
Patient states she is all good.  Does not need [place] to do anything different right now.

## 2022-03-28 ENCOUNTER — Ambulatory Visit
Payer: Medicare Other | Attending: Student in an Organized Health Care Education/Training Program | Admitting: Student in an Organized Health Care Education/Training Program

## 2022-03-28 ENCOUNTER — Encounter: Payer: Self-pay | Admitting: Student in an Organized Health Care Education/Training Program

## 2022-03-28 VITALS — BP 128/87 | HR 79 | Temp 97.0°F | Resp 17 | Ht 66.0 in | Wt 177.0 lb

## 2022-03-28 DIAGNOSIS — G8929 Other chronic pain: Secondary | ICD-10-CM

## 2022-03-28 DIAGNOSIS — M5416 Radiculopathy, lumbar region: Secondary | ICD-10-CM | POA: Diagnosis present

## 2022-03-28 DIAGNOSIS — M5414 Radiculopathy, thoracic region: Secondary | ICD-10-CM

## 2022-03-28 DIAGNOSIS — G894 Chronic pain syndrome: Secondary | ICD-10-CM | POA: Diagnosis present

## 2022-03-28 DIAGNOSIS — M5124 Other intervertebral disc displacement, thoracic region: Secondary | ICD-10-CM

## 2022-03-28 DIAGNOSIS — M4692 Unspecified inflammatory spondylopathy, cervical region: Secondary | ICD-10-CM | POA: Diagnosis present

## 2022-03-28 DIAGNOSIS — M47812 Spondylosis without myelopathy or radiculopathy, cervical region: Secondary | ICD-10-CM | POA: Diagnosis present

## 2022-03-28 DIAGNOSIS — Z981 Arthrodesis status: Secondary | ICD-10-CM | POA: Diagnosis present

## 2022-03-28 DIAGNOSIS — M5412 Radiculopathy, cervical region: Secondary | ICD-10-CM | POA: Insufficient documentation

## 2022-03-28 DIAGNOSIS — M5030 Other cervical disc degeneration, unspecified cervical region: Secondary | ICD-10-CM | POA: Diagnosis present

## 2022-03-28 MED ORDER — GABAPENTIN 300 MG PO CAPS: 900.0000 mg | ORAL_CAPSULE | Freq: Two times a day (BID) | ORAL | 2 refills | Status: DC

## 2022-03-28 MED ORDER — TIZANIDINE HCL 4 MG PO TABS: 4.0000 mg | ORAL_TABLET | Freq: Three times a day (TID) | ORAL | 5 refills | Status: DC | PRN

## 2022-03-28 MED ORDER — HYDROCODONE-ACETAMINOPHEN 10-325 MG PO TABS: 1.0000 | ORAL_TABLET | Freq: Three times a day (TID) | ORAL | 0 refills | Status: DC | PRN

## 2022-03-28 NOTE — Progress Notes (Signed)
Nursing Pain Medication Assessment:  Safety precautions to be maintained throughout the outpatient stay will include: orient to surroundings, keep bed in low position, maintain call bell within reach at all times, provide assistance with transfer out of bed and ambulation.  Medication Inspection Compliance: Pill count conducted under aseptic conditions, in front of the patient. Neither the pills nor the bottle was removed from the patient's sight at any time. Once count was completed pills were immediately returned to the patient in their original bottle.  Medication: Hydrocodone/APAP Pill/Patch Count:  69 of 90 pills remain Pill/Patch Appearance: Markings consistent with prescribed medication Bottle Appearance: Standard pharmacy container. Clearly labeled. Filled Date: 8 / 40 / 2023 Last Medication intake:  Today

## 2022-03-28 NOTE — Progress Notes (Signed)
PROVIDER NOTE: Information contained herein reflects review and annotations entered in association with encounter. Interpretation of such information and data should be left to medically-trained personnel. Information provided to patient can be located elsewhere in the medical record under "Patient Instructions". Document created using STT-dictation technology, any transcriptional errors that may result from process are unintentional.    Patient: Tammy Lamb  Service Category: E/M  Provider: Gillis Santa, MD  DOB: August 28, 1950  DOS: 03/28/2022  Specialty: Interventional Pain Management  MRN: [id number]  Setting: Ambulatory outpatient  PCP: Gladstone Lighter, MD  Type: Established Patient    Referring Provider: Gladstone Lighter, MD  Location: Office  Delivery: Face-to-face     HPI  [name], a 71 y.o. year old female, is here today because of her Chronic radicular cervical pain [M54.12, G89.29]. [name] primary complain today is Neck Pain Last encounter: My last encounter with her was on 12/26/2021 Pertinent problems: 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 Pain Assessment: Severity of Chronic pain is reported as a 7 /10. Location: Neck  /"sometimes to left shoulder", down back to left hip down left leg to small toe". Onset: More than a month ago. Quality: Burning. Timing: Constant. Modifying factor(s): meds. Vitals:  height is _0  (1.676 m) and weight is 177 lb (80.3 kg). Her temporal temperature is 97 F (36.1  C) (abnormal). Her blood pressure is 128/87 and her pulse is 79. Her respiration is 17 and oxygen saturation is 97%.   Reason for encounter: medication management.   No change in medical history since last visit.  Patient's pain is at baseline.  Patient continues multimodal pain regimen as prescribed.  States that it provides pain relief and improvement in functional status.   Pharmacotherapy Assessment  Analgesic: Hydrocodone 95m TID prn 90/month     Monitoring: [place] PMP: PDMP reviewed during this encounter.       Pharmacotherapy: No side-effects or adverse reactions reported. Compliance: No problems identified. Effectiveness: Clinically acceptable.  WRise Patience RN  03/28/2022 11:47 AM  Sign when Signing Visit Nursing Pain Medication Assessment:  Safety precautions to be maintained throughout the outpatient stay will include: orient to surroundings, keep bed in low position, maintain call bell within reach at all times, provide assistance with transfer out of bed and ambulation.  Medication Inspection Compliance: Pill count conducted under aseptic conditions, in front of the patient. Neither the pills nor the bottle was removed from the patient's sight at any time. Once count was completed pills were immediately returned to the patient in their original bottle.  Medication: Hydrocodone/APAP Pill/Patch Count:  69 of 90 pills remain Pill/Patch Appearance: Markings consistent with prescribed medication Bottle Appearance: Standard pharmacy container. Clearly labeled. Filled Date: 8 / 164/ 2023 Last Medication intake:  Today     UDS:  Summary  Date Value Ref Range Status  08/25/2021 Note  Final    Comment:    ==================================================================== [employer] [facility] 13 (MW) ==================================================================== Test                             Result  Flag       Units  Drug Present and Declared for Prescription  Verification   Hydrocodone                    512          EXPECTED   ng/mg creat   Norhydrocodone                 2435         EXPECTED   ng/mg creat    Sources of hydrocodone include scheduled prescription medications.    Norhydrocodone is an expected metabolite of hydrocodone.  ==================================================================== Test                      Result    Flag   Units      Ref Range   Creatinine              34               mg/dL      >=20 ==================================================================== Declared Medications:  The flagging and interpretation on this report are based on the  following declared medications.  Unexpected results may arise from  inaccuracies in the declared medications.   **Note: The testing scope of this panel includes these medications:   Hydrocodone (Norco)   **Note: The testing scope of this panel does not include the  following reported medications:   Acetaminophen (Tylenol)  Acetaminophen (Norco)  Albuterol  Amitriptyline (Elavil)  Amlodipine (Norvasc)  Atorvastatin (Lipitor)  Celecoxib (Celebrex)  Cephalexin (Keflex)  Cholecalciferol  Cinnamon  Citalopram (Celexa)  Cyanocobalamin  Fluticasone (Advair)  Gabapentin (Neurontin)  Hydrochlorothiazide  Hydroxyzine (Atarax)  Magnesium (Mag-Ox)  Metformin  Omeprazole  Oxybutynin  Pentosan  Polyethylene Glycol (MiraLAX)  Salmeterol (Advair)  Sodium Bicarbonate  Sulfasalazine (Azulfidine)  Tizanidine  Valsartan (Diovan)  Vitamin C ==================================================================== For clinical consultation, please call [phone]. ====================================================================      ROS  Constitutional: Denies any fever or chills Gastrointestinal: No reported hemesis, hematochezia, vomiting, or acute GI distress Musculoskeletal: Low back, left hip, left greater than right knee pain Neurological: No reported  episodes of acute onset apraxia, aphasia, dysarthria, agnosia, amnesia, paralysis, loss of coordination, or loss of consciousness  Medication Review  Cinnamon, DULoxetine, Fish Oil, HYDROcodone-acetaminophen, Omeprazole-Sodium Bicarbonate, Pentosan Polysulfate Sodium, acetaminophen, albuterol, amLODipine, amitriptyline, ascorbic acid, atorvastatin, cephALEXin, cholecalciferol, citalopram, cyanocobalamin, fluticasone-salmeterol, gabapentin, hydrOXYzine, hydrochlorothiazide, magnesium oxide, metFORMIN, oxybutynin, polyethylene glycol, ramipril, sulfaSALAzine, tiZANidine, and valsartan  History Review  Allergy: Ms. Whitmill is allergic to aspirin and sulfa antibiotics. Drug: Ms. Mikowski  reports no history of drug use. Alcohol:  reports that she does not currently use alcohol. Tobacco:  reports that she has been smoking cigarettes. She has a 24.50 pack-year smoking history. She has never used smokeless tobacco. Social: Ms. Mahaffy  reports that she has been smoking cigarettes. She has a 24.50 pack-year smoking history. She has never used smokeless tobacco. She reports that she does not currently use alcohol. She reports that she does not use drugs. Medical:  has a past medical history of Anemia, Anxiety, Arthritis, Bilateral carotid artery stenosis, Chronic lower back pain, Colitis, ulcerative ([place]), COPD (chronic obstructive pulmonary disease) ([place]), Depression, Diabetes mellitus without complication ([place]), GERD (gastroesophageal reflux disease), H/O bladder infections, History of 2019 novel coronavirus disease (COVID-19) (02/2021), History of hiatal hernia, HLD (hyperlipidemia), Hypertension, Interstitial cystitis, Low sodium levels, Motion sickness, Parkinson's disease ([place]), PVD (peripheral vascular disease) ([place]), Sinus headache, Stroke ([place]) (  2015), TIA (transient ischemic attack) (2015), Urinary frequency, and Wears dentures. Surgical: Ms. Petrosky  has a past surgical history that includes Tonsillectomy;  Cholecystectomy; Abdominal hysterectomy; Diagnostic laparoscopy; Bladder suspension; Colonoscopy; Upper gi endoscopy; Knee arthroscopy (Left, 02/13/2014); Ankle arthroscopy (Left, 03/22/2016); Tendon repair (Left, 03/22/2016); Lumbar laminectomy/decompression microdiscectomy (N/A, 05/30/2017); Anterior cervical decomp/discectomy fusion (N/A, 04/09/2019); and Lumbar laminectomy/decompression microdiscectomy (N/A, 05/27/2021). Family: family history includes COPD in her father; Diabetes in her mother; Heart disease in her father; Hypertension in her father and mother.  Imaging review: LUMBAR SPINE - COMPLETE WITH BENDING VIEWS   COMPARISON:  MRI lumbar spine 08/29/2017   FINDINGS: There are 5 non-rib-bearing lumbar-type vertebral bodies. Mild dextrocurvature centered at L4-5 with severe left L4-5 disc space narrowing seen on frontal view. Vertebral body heights are maintained.   Normal sagittal alignment. Mild-to-moderate T9-10 and T10-11, mild T11-12, mild-to-moderate T12-L1, mild L1-2, mild posterior L2-3 through L4-5, and moderate posterior and mild anterior L5-S1 disc space narrowing.   Facet joint arthropathy is greatest at L5-S1.   Cholecystectomy clips.   IMPRESSION: 1. Normal alignment. 2. Vertebral body heights are maintained. 3. Multilevel degenerative disc changes, greatest at L5-S1 and the left L4-5 level.  CLINICAL DATA:  Right knee pain/arthralgia. Chronic pain. Worse after falling 11/27/2021.   EXAM: RIGHT KNEE - 1-2 VIEW   COMPARISON:  None Available.   FINDINGS: Minimal superior patellar degenerative osteophytosis. No significant joint space narrowing. Mildly decreased bone mineralization. No joint effusion. Minimal chronic enthesopathic change at the quadriceps insertion on the patella. No acute fracture or dislocation.   IMPRESSION: 1. Minimal superior patellar degenerative osteophyte. 2. No acute fracture.  Narrative & Impression  CLINICAL DATA:  Chronic  knee pain.  Worse after fall 11/27/2021.   EXAM: LEFT KNEE - 1-2 VIEW   COMPARISON:  None Available.   FINDINGS: Mild medial compartment joint space narrowing and peripheral degenerative osteophytes. Minimal superior patellar degenerative osteophytosis. Minimal chronic enthesopathic change at the quadriceps insertion on the patella. No joint effusion. No acute fracture or dislocation. Mildly decreased bone mineralization.   IMPRESSION: Mild medial and minimal patellofemoral osteoarthritis. No acute fracture.    CLINICAL DATA:  Right knee pain/arthralgia. History of chronic pain. Worse after falling 11/27/2021.   EXAM: DG HIP (WITH OR WITHOUT PELVIS) 2-3V RIGHT; DG HIP (WITH OR WITHOUT PELVIS) 2-3V LEFT   COMPARISON:  None Available.   FINDINGS: There is diffuse decreased bone mineralization. Mild bilateral superior femoroacetabular joint space narrowing. Mild bilateral superolateral acetabular degenerative osteophytosis. Mild pubic symphysis joint space narrowing and subchondral sclerosis degenerative change. Mild bilateral inferior sacroiliac joint space narrowing and subchondral sclerosis.   Minimal right-greater-than-left convexity of the anterior superior femoral head-neck junctions.   IMPRESSION: 1. Mild bilateral femoroacetabular, mild bilateral sacroiliac, and mild pubic symphysis osteoarthritis. 2. Minimal right-greater-than-left convexity of the anterior superior femoral head-neck junctions, a CAM-type bump morphology that mildly increases propensity for CAM-type femoroacetabular impingement.  Laboratory Chemistry Profile   Renal Lab Results  Component Value Date   BUN 9 05/23/2021   CREATININE 0.73 05/23/2021   GFRAA >60 04/02/2019   GFRNONAA >60 05/23/2021    Hepatic No results found for: "AST", "ALT", "ALBUMIN", "ALKPHOS", "HCVAB", "AMYLASE", "LIPASE", "AMMONIA"  Electrolytes Lab Results  Component Value Date   NA 133 (L) 05/23/2021   K 3.9  05/23/2021   CL 95 (L) 05/23/2021   CALCIUM 9.0 05/23/2021    Bone No results found for: "VD25OH", "VD125OH2TOT", "QT6226JF3", "LK5625WL8", "25OHVITD1", "25OHVITD2", "25OHVITD3", "TESTOFREE", "TESTOSTERONE"  Inflammation (CRP: Acute  Phase) (ESR: Chronic Phase) No results found for: "CRP", "ESRSEDRATE", "LATICACIDVEN"       Note: Above Lab results reviewed.   Physical Exam  General appearance: Well nourished, well developed, and well hydrated. In no apparent acute distress Mental status: Alert, oriented x 3 (person, place, & time)       Respiratory: No evidence of acute respiratory distress Eyes: PERLA Vitals: BP 128/87   Pulse 79   Temp (!) 97 F (36.1 C) (Temporal)   Resp 17   Ht _0  (1.676 m)   Wt 177 lb (80.3 kg)   SpO2 97%   BMI 28.57 kg/m  BMI: Estimated body mass index is 28.57 kg/m as calculated from the following:   Height as of this encounter: _1  (1.676 m).   Weight as of this encounter: 177 lb (80.3 kg). Ideal: Ideal body weight: 59.3 kg (130 lb 11.7 oz) Adjusted ideal body weight: 67.7 kg (149 lb 3.8 oz)  Thoracic Spine Area Exam  Skin & Axial Inspection: No masses, redness, or swelling Alignment: Symmetrical Functional ROM: Pain restricted ROM Stability: No instability detected Muscle Tone/Strength: Functionally intact. No obvious neuro-muscular anomalies detected. Sensory (Neurological): Neurogenic pain pattern Muscle strength & Tone: No palpable anomalies Lumbar Spine Area Exam  Skin & Axial Inspection: No masses, redness, or swelling Alignment: Symmetrical Functional ROM: Pain restricted ROM  Stability: No instability detected Muscle Tone/Strength: Functionally intact. No obvious neuro-muscular anomalies detected. Sensory (Neurological): Neurogenic pain pattern left lower extremity  Gait & Posture Assessment  Ambulation: Unassisted Gait: Antalgic Posture: Difficulty standing up straight, due to pain  Lower Extremity Exam    Side: Right lower  extremity  Side: Left lower extremity  Stability: No instability observed          Stability: No instability observed          Skin & Extremity Inspection: Skin color, temperature, and hair growth are WNL. No peripheral edema or cyanosis. No masses, redness, swelling, asymmetry, or associated skin lesions. No contractures.  Skin & Extremity Inspection: Skin color, temperature, and hair growth are WNL. No peripheral edema or cyanosis. No masses, redness, swelling, asymmetry, or associated skin lesions. No contractures.  Functional ROM: Pain restricted ROM for hip joint          Functional ROM: Pain restricted ROM for hip joint          Muscle Tone/Strength: Functionally intact. No obvious neuro-muscular anomalies detected.  Muscle Tone/Strength: Functionally intact. No obvious neuro-muscular anomalies detected.  Sensory (Neurological): Neurogenic pain pattern        Sensory (Neurological): Neurogenic pain pattern        DTR: Patellar: deferred today Achilles: deferred today Plantar: deferred today  DTR: Patellar: deferred today Achilles: deferred today Plantar: deferred today  Palpation: No palpable anomalies  Palpation: No palpable anomalies    Assessment   Diagnosis  1. Chronic radicular cervical pain   2. DDD (degenerative disc disease), cervical   3. S/P cervical spinal fusion (C3-C4 ACDF (03/2019)   4. Inflammatory spondylopathy of cervical region (HCC)   5. Cervical facet joint syndrome   6. Lumbar radiculopathy   7. Chronic pain syndrome   8. Thoracic disc herniation   9. Radicular pain of thoracic region         Plan of Care   Ms. Maty D Sura has a current medication list which includes the following long-term medication(s): amlodipine, atorvastatin, duloxetine, fluticasone-salmeterol, gabapentin, hydrochlorothiazide, [START ON 05/05/2022] hydrocodone-acetaminophen, [START ON 06/04/2022] hydrocodone-acetaminophen, sulfasalazine, tizanidine,  valsartan, metformin, and  metformin.  Pharmacotherapy (Medications Ordered): Meds ordered this encounter  Medications   HYDROcodone-acetaminophen (NORCO) 10-325 MG tablet    Sig: Take 1 tablet by mouth every 8 (eight) hours as needed for severe pain.    Dispense:  90 tablet    Refill:  0   tiZANidine (ZANAFLEX) 4 MG tablet    Sig: Take 1 tablet (4 mg total) by mouth every 8 (eight) hours as needed.    Dispense:  90 tablet    Refill:  5    Fill one day early if pharmacy is closed on scheduled refill date. Generic permitted. Do not send renewal requests. Void any older duplicate prescription or refill(s) that may be on file.   gabapentin (NEURONTIN) 300 MG capsule    Sig: Take 3 capsules (900 mg total) by mouth 2 (two) times daily.    Dispense:  180 capsule    Refill:  2   HYDROcodone-acetaminophen (NORCO) 10-325 MG tablet    Sig: Take 1 tablet by mouth every 8 (eight) hours as needed for severe pain.    Dispense:  90 tablet    Refill:  0    Orders:  No orders of the defined types were placed in this encounter.  Follow-up plan:   Return in about 3 months (around 06/29/2022) for Medication Management, in person.    Recent Visits No visits were found meeting these conditions. Showing recent visits within past 90 days and meeting all other requirements Today's Visits Date Type Provider Dept  03/28/22 Office Visit Gillis Santa, MD [facility]  Showing today's visits and meeting all other requirements Future Appointments Date Type Provider Dept  06/22/22 Appointment Gillis Santa, MD [facility]  Showing future appointments within next 90 days and meeting all other requirements  I discussed the assessment and treatment plan with the patient. The patient was provided an opportunity to ask questions and all were answered. The patient agreed with the plan and demonstrated an understanding of the instructions.  Patient advised to call back or seek an in-person evaluation if the symptoms or  condition worsens.  Duration of encounter: 81mnutes.  Note by: BGillis Santa MD Date: 03/28/2022; Time: 1:14 PM

## 2022-04-10 ENCOUNTER — Other Ambulatory Visit: Payer: Self-pay | Admitting: Internal Medicine

## 2022-04-10 DIAGNOSIS — Z1231 Encounter for screening mammogram for malignant neoplasm of breast: Secondary | ICD-10-CM

## 2022-04-20 ENCOUNTER — Telehealth: Payer: Self-pay | Admitting: *Deleted

## 2022-04-20 NOTE — Telephone Encounter (Signed)
Left message for patient to call back to schedule follow up lung cancer screening CT scan.

## 2022-04-25 ENCOUNTER — Other Ambulatory Visit: Payer: Self-pay

## 2022-04-25 DIAGNOSIS — Z87891 Personal history of nicotine dependence: Secondary | ICD-10-CM

## 2022-04-25 DIAGNOSIS — R911 Solitary pulmonary nodule: Secondary | ICD-10-CM

## 2022-04-25 DIAGNOSIS — F17210 Nicotine dependence, cigarettes, uncomplicated: Secondary | ICD-10-CM

## 2022-05-04 ENCOUNTER — Ambulatory Visit: Payer: Medicare Other

## 2022-05-04 ENCOUNTER — Telehealth: Payer: Self-pay | Admitting: Student in an Organized Health Care Education/Training Program

## 2022-05-04 NOTE — Telephone Encounter (Signed)
Patient called stating her [facility] does not have her Hydrocone, she is going to call around and see if another pharmacy has it then call back

## 2022-05-04 NOTE — Telephone Encounter (Signed)
Called patients current pharm. Patient is due to have it filled tomorrow. They stated to me that they do not have any nacrs and would need to find another pharm.

## 2022-05-04 NOTE — Telephone Encounter (Signed)
PT stated that she has called around to varies of pharmacy all pharmacy are out. PT wants to know what else can be done. Please give patient a call. Thanks

## 2022-05-04 NOTE — Telephone Encounter (Signed)
Called patient back and gave her a few places to call. Patient is currently in car with husband . Instructed her to continue to call different pharm.

## 2022-05-08 ENCOUNTER — Telehealth: Payer: Self-pay | Admitting: Student in an Organized Health Care Education/Training Program

## 2022-05-08 NOTE — Telephone Encounter (Signed)
Patient has been unable to get Hydrocodone filled. Is there any other medication that can be prescribed.

## 2022-05-09 ENCOUNTER — Ambulatory Visit
Admission: RE | Admit: 2022-05-09 | Discharge: 2022-05-09 | Disposition: A | Discharge status: Home / Self Care | Payer: Medicare Other | Source: Ambulatory Visit | Attending: Acute Care | Admitting: Acute Care

## 2022-05-09 DIAGNOSIS — Z87891 Personal history of nicotine dependence: Secondary | ICD-10-CM | POA: Insufficient documentation

## 2022-05-09 DIAGNOSIS — R911 Solitary pulmonary nodule: Secondary | ICD-10-CM | POA: Diagnosis present

## 2022-05-09 DIAGNOSIS — F17210 Nicotine dependence, cigarettes, uncomplicated: Secondary | ICD-10-CM | POA: Insufficient documentation

## 2022-05-15 ENCOUNTER — Ambulatory Visit
Admission: RE | Admit: 2022-05-15 | Discharge: 2022-05-15 | Disposition: A | Discharge status: Home / Self Care | Payer: Medicare Other | Source: Ambulatory Visit | Attending: Internal Medicine | Admitting: Internal Medicine

## 2022-05-15 DIAGNOSIS — Z1231 Encounter for screening mammogram for malignant neoplasm of breast: Secondary | ICD-10-CM | POA: Diagnosis present

## 2022-05-16 ENCOUNTER — Telehealth: Payer: Self-pay | Admitting: Student in an Organized Health Care Education/Training Program

## 2022-05-16 NOTE — Telephone Encounter (Signed)
PT stated that she is having pain left hip. PT stated that she has went got a shot in her left hip and will be going back tomorrow for an shot shot. PT just wanted to let doctor know that she has been going to get shots and also patient wants to discuss about changing medications. Thanks.

## 2022-05-17 ENCOUNTER — Telehealth: Payer: Self-pay | Admitting: Student in an Organized Health Care Education/Training Program

## 2022-05-17 MED ORDER — HYDROCODONE-ACETAMINOPHEN 7.5-325 MG PO TABS: 1.0000 | ORAL_TABLET | Freq: Four times a day (QID) | ORAL | 0 refills | Status: DC | PRN

## 2022-05-17 NOTE — Telephone Encounter (Signed)
Patient states it has been 4 weeks since she has had her medication because the pharmacy is out.  She does not want to go to another pharmacy.  She said they do have the 7.5 mg Hydrocodone and is willing to go down to that if Dr Holley Raring will send in new scripts.

## 2022-05-17 NOTE — Telephone Encounter (Signed)
Pt stated that pharmacy is out of the hydrocodone 10-325 mg. I advise patient to call around to see which pharmacy may have medication in stock. However patient that she doesn't want to switch pharmacy. PT stated that she has been out of medication. PT stated that pharmacy has 7, if doctor can just change medication. Please give patient a call. Thanks

## 2022-05-18 ENCOUNTER — Telehealth: Payer: Self-pay | Admitting: Acute Care

## 2022-05-18 ENCOUNTER — Telehealth: Payer: Self-pay

## 2022-05-18 DIAGNOSIS — Z87891 Personal history of nicotine dependence: Secondary | ICD-10-CM

## 2022-05-18 DIAGNOSIS — R911 Solitary pulmonary nodule: Secondary | ICD-10-CM

## 2022-05-18 NOTE — Telephone Encounter (Signed)
CT results faxed to PCP with follow up plans included. Order placed for 6 mth follow up scan.

## 2022-05-18 NOTE — Telephone Encounter (Signed)
I have called the patient with the results of her low-dose screening CT.  I explained that in the 6 months since we have done the previous CT there has been a small amount of growth in the nodule of concern in the left lower lobe.  The nodule has increased in size from 6.9 mm to 7.7 mm in the 80-monthinterval.  I have discussed this with Dr. IValeta Harmswho agrees that we should do another 672-monthvaluation of this nodule for continued growth.  It is not 8 mm therefore not PET avid at this point.  We will reevaluate in 6 months  I also spoke with the patient about the hiatal hernia which is large containing the majority of the stomach.  I asked her if she would like referral to thoracic surgery for evaluation with robotics for repair.  She states she has a lot going on right now and would prefer to wait to refer.  She would like to discuss it with me at her 6-12-monthllow-up to see if it is viable to do at that time.  [facility] place order for 6-m10-month-dose CT follow-up, fax results to PCP.  Please let them know plan is for an additional 6-mo32-monthow-up.  [facility] however finding of aortic atherosclerosis and coronary artery calcification should be reviewed by her cardiologist.  Thank you so much

## 2022-05-18 NOTE — Telephone Encounter (Signed)
Patient notified

## 2022-06-01 ENCOUNTER — Other Ambulatory Visit: Payer: Self-pay | Admitting: Orthopedic Surgery

## 2022-06-01 DIAGNOSIS — M7061 Trochanteric bursitis, right hip: Secondary | ICD-10-CM

## 2022-06-01 DIAGNOSIS — M1612 Unilateral primary osteoarthritis, left hip: Secondary | ICD-10-CM

## 2022-06-01 DIAGNOSIS — M5416 Radiculopathy, lumbar region: Secondary | ICD-10-CM

## 2022-06-16 ENCOUNTER — Ambulatory Visit
Admission: RE | Admit: 2022-06-16 | Discharge: 2022-06-16 | Disposition: A | Discharge status: Home / Self Care | Payer: Medicare Other | Source: Ambulatory Visit | Attending: Orthopedic Surgery | Admitting: Orthopedic Surgery

## 2022-06-16 DIAGNOSIS — M7062 Trochanteric bursitis, left hip: Secondary | ICD-10-CM | POA: Insufficient documentation

## 2022-06-16 DIAGNOSIS — M5416 Radiculopathy, lumbar region: Secondary | ICD-10-CM | POA: Diagnosis present

## 2022-06-16 DIAGNOSIS — M7061 Trochanteric bursitis, right hip: Secondary | ICD-10-CM | POA: Insufficient documentation

## 2022-06-16 DIAGNOSIS — M1612 Unilateral primary osteoarthritis, left hip: Secondary | ICD-10-CM | POA: Insufficient documentation

## 2022-06-20 ENCOUNTER — Encounter: Payer: Self-pay | Admitting: Student in an Organized Health Care Education/Training Program

## 2022-06-20 ENCOUNTER — Ambulatory Visit
Payer: Medicare Other | Attending: Student in an Organized Health Care Education/Training Program | Admitting: Student in an Organized Health Care Education/Training Program

## 2022-06-20 VITALS — BP 149/83 | HR 76 | Temp 97.2°F | Ht 66.0 in | Wt 183.0 lb

## 2022-06-20 DIAGNOSIS — M5412 Radiculopathy, cervical region: Secondary | ICD-10-CM | POA: Diagnosis present

## 2022-06-20 DIAGNOSIS — M4692 Unspecified inflammatory spondylopathy, cervical region: Secondary | ICD-10-CM | POA: Diagnosis present

## 2022-06-20 DIAGNOSIS — G894 Chronic pain syndrome: Secondary | ICD-10-CM | POA: Diagnosis present

## 2022-06-20 DIAGNOSIS — M5030 Other cervical disc degeneration, unspecified cervical region: Secondary | ICD-10-CM

## 2022-06-20 DIAGNOSIS — M5414 Radiculopathy, thoracic region: Secondary | ICD-10-CM | POA: Diagnosis present

## 2022-06-20 DIAGNOSIS — M5124 Other intervertebral disc displacement, thoracic region: Secondary | ICD-10-CM | POA: Diagnosis present

## 2022-06-20 DIAGNOSIS — Z981 Arthrodesis status: Secondary | ICD-10-CM

## 2022-06-20 DIAGNOSIS — G8929 Other chronic pain: Secondary | ICD-10-CM

## 2022-06-20 DIAGNOSIS — M47812 Spondylosis without myelopathy or radiculopathy, cervical region: Secondary | ICD-10-CM

## 2022-06-20 DIAGNOSIS — M5416 Radiculopathy, lumbar region: Secondary | ICD-10-CM

## 2022-06-20 MED ORDER — HYDROCODONE-ACETAMINOPHEN 7.5-325 MG PO TABS: 1.0000 | ORAL_TABLET | Freq: Three times a day (TID) | ORAL | 0 refills | Status: DC | PRN

## 2022-06-20 MED ORDER — GABAPENTIN 300 MG PO CAPS: 900.0000 mg | ORAL_CAPSULE | Freq: Two times a day (BID) | ORAL | 2 refills | Status: DC

## 2022-06-20 NOTE — Progress Notes (Signed)
Safety precautions to be maintained throughout the outpatient stay will include: orient to surroundings, keep bed in low position, maintain call bell within reach at all times, provide assistance with transfer out of bed and ambulation. Nursing Pain Medication Assessment:  Safety precautions to be maintained throughout the outpatient stay will include: orient to surroundings, keep bed in low position, maintain call bell within reach at all times, provide assistance with transfer out of bed and ambulation.  Medication Inspection Compliance: Pill count conducted under aseptic conditions, in front of the patient. Neither the pills nor the bottle was removed from the patient's sight at any time. Once count was completed pills were immediately returned to the patient in their original bottle.  Medication: See above Pill/Patch Count:  57 of 120 pills remain Pill/Patch Appearance: Markings consistent with prescribed medication Bottle Appearance: Standard pharmacy container. Clearly labeled. Filled Date: 57 / 25 / 2023 Last Medication intake:  TodaySafety precautions to be maintained throughout the outpatient stay will include: orient to surroundings, keep bed in low position, maintain call bell within reach at all times, provide assistance with transfer out of bed and ambulation.

## 2022-06-20 NOTE — Progress Notes (Signed)
PROVIDER NOTE: Information contained herein reflects review and annotations entered in association with encounter. Interpretation of such information and data should be left to medically-trained personnel. Information provided to patient can be located elsewhere in the medical record under "Patient Instructions". Document created using STT-dictation technology, any transcriptional errors that may result from process are unintentional.    Patient: Tammy Lamb  Service Category: E/M  Provider: Gillis Santa, MD  DOB: 05-19-1951  DOS: 06/20/2022  Specialty: Interventional Pain Management  MRN: [id number]  Setting: Ambulatory outpatient  PCP: Gladstone Lighter, MD  Type: Established Patient    Referring Provider: Gladstone Lighter, MD  Location: Office  Delivery: Face-to-face     HPI  [name], a 71 y.o. year old female, is here today because of her Chronic radicular cervical pain [M54.12, G89.29]. [name] primary complain today is Hip Pain (left) Last encounter: My last encounter with her was on 12/26/2021 Pertinent problems: 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 Pain Assessment: Severity of Chronic pain is reported as a 8 /10. Location: Hip Left/radiates down left leg to knee. Onset: More than a month ago. Quality: Constant. Timing: Constant. Modifying factor(s): meds. Vitals:  height is _0  (1.676 m) and weight is 183 lb (83 kg). Her temporal temperature is 97.2 F (36.2 C) (abnormal). Her blood pressure is  149/83 (abnormal) and her pulse is 76. Her oxygen saturation is 94%.   Reason for encounter: medication management.   Saw [facility] for hip injections, they were not helpful. She has had them prior. They ordered an MRI of her left hip, she has not heard back regarding scheduling Refill Hydrocodone 7.5 mg TID prn along with Gabapentin as below side effects with medications.  Endorses analgesic and some functional benefit with them.   Pharmacotherapy Assessment  Analgesic: Hydrocodone 7.67m TID prn 90/month     Monitoring: [place] PMP: PDMP reviewed during this encounter.       Pharmacotherapy: No side-effects or adverse reactions reported. Compliance: No problems identified. Effectiveness: Clinically acceptable.  HArlice Colt RN  06/20/2022  8:16 AM  Sign when Signing Visit Safety precautions to be maintained throughout the outpatient stay will include: orient to surroundings, keep bed in low position, maintain call bell within reach at all times, provide assistance with transfer out of bed and ambulation. Nursing Pain Medication Assessment:  Safety precautions to be maintained throughout the outpatient stay will include: orient to surroundings, keep bed in low position, maintain call bell within reach at all times, provide assistance with transfer out of bed and ambulation.  Medication Inspection Compliance: Pill count conducted under aseptic conditions, in front of the patient. Neither the pills nor the bottle was removed from the patient's sight at any time. Once count was completed pills were immediately returned to the patient in their original bottle.  Medication: See above Pill/Patch Count:  57 of 120 pills remain Pill/Patch Appearance: Markings consistent with prescribed medication Bottle Appearance: Standard pharmacy container. Clearly labeled. Filled Date: 189/ 25 / 2023 Last Medication intake:  TodaySafety precautions to be maintained throughout the outpatient stay  will include: orient to surroundings, keep bed in low  position, maintain call bell within reach at all times, provide assistance with transfer out of bed and ambulation.      UDS:  Summary  Date Value Ref Range Status  08/25/2021 Note  Final    Comment:    ==================================================================== [employer] [facility] (MW) ==================================================================== Test                             Result       Flag       Units  Drug Present and Declared for Prescription Verification   Hydrocodone                    512          EXPECTED   ng/mg creat   Norhydrocodone                 2435         EXPECTED   ng/mg creat    Sources of hydrocodone include scheduled prescription medications.    Norhydrocodone is an expected metabolite of hydrocodone.  ==================================================================== Test                      Result    Flag   Units      Ref Range   Creatinine              34               mg/dL      >=20 ==================================================================== Declared Medications:  The flagging and interpretation on this report are based on the  following declared medications.  Unexpected results may arise from  inaccuracies in the declared medications.   **Note: The testing scope of this panel includes these medications:   Hydrocodone (Norco)   **Note: The testing scope of this panel does not include the  following reported medications:   Acetaminophen (Tylenol)  Acetaminophen (Norco)  Albuterol  Amitriptyline (Elavil)  Amlodipine (Norvasc)  Atorvastatin (Lipitor)  Celecoxib (Celebrex)  Cephalexin (Keflex)  Cholecalciferol  Cinnamon  Citalopram (Celexa)  Cyanocobalamin  Fluticasone (Advair)  Gabapentin (Neurontin)  Hydrochlorothiazide  Hydroxyzine (Atarax)  Magnesium (Mag-Ox)  Metformin  Omeprazole  Oxybutynin  Pentosan  Polyethylene Glycol (MiraLAX)  Salmeterol  (Advair)  Sodium Bicarbonate  Sulfasalazine (Azulfidine)  Tizanidine  Valsartan (Diovan)  Vitamin C ==================================================================== For clinical consultation, please call [phone]. ====================================================================      ROS  Constitutional: Denies any fever or chills Gastrointestinal: No reported hemesis, hematochezia, vomiting, or acute GI distress Musculoskeletal: Low back, left hip, left greater than right knee pain Neurological: No reported episodes of acute onset apraxia, aphasia, dysarthria, agnosia, amnesia, paralysis, loss of coordination, or loss of consciousness  Medication Review  Cinnamon, DULoxetine, Fish Oil, HYDROcodone-acetaminophen, Omeprazole-Sodium Bicarbonate, Pentosan Polysulfate Sodium, acetaminophen, albuterol, amLODipine, amitriptyline, ascorbic acid, atorvastatin, cephALEXin, cholecalciferol, citalopram, cyanocobalamin, fluticasone-salmeterol, gabapentin, hydrOXYzine, hydrochlorothiazide, magnesium oxide, metFORMIN, oxybutynin, polyethylene glycol, ramipril, sulfaSALAzine, tiZANidine, and valsartan  History Review  Allergy: Ms. Yeats is allergic to aspirin and sulfa antibiotics. Drug: Ms. Corcoran  reports no history of drug use. Alcohol:  reports that she does not currently use alcohol. Tobacco:  reports that she has been smoking cigarettes. She has a 24.50 pack-year smoking history. She has never used smokeless tobacco. Social: Ms. Groseclose  reports that she has been smoking cigarettes. She has a 24.50 pack-year smoking history. She has never used smokeless tobacco. She reports that she does not currently  use alcohol. She reports that she does not use drugs. Medical:  has a past medical history of Anemia, Anxiety, Arthritis, Bilateral carotid artery stenosis, Chronic lower back pain, Colitis, ulcerative ([place]), COPD (chronic obstructive pulmonary disease) ([place]), Depression, Diabetes mellitus  without complication ([place]), GERD (gastroesophageal reflux disease), H/O bladder infections, History of 2019 novel coronavirus disease (COVID-19) (02/2021), History of hiatal hernia, HLD (hyperlipidemia), Hypertension, Interstitial cystitis, Low sodium levels, Motion sickness, Parkinson's disease, PVD (peripheral vascular disease) ([place]), Sinus headache, Stroke ([place]) (2015), TIA (transient ischemic attack) (2015), Urinary frequency, and Wears dentures. Surgical: Ms. Zulauf  has a past surgical history that includes Tonsillectomy; Cholecystectomy; Abdominal hysterectomy; Diagnostic laparoscopy; Bladder suspension; Colonoscopy; Upper gi endoscopy; Knee arthroscopy (Left, 02/13/2014); Ankle arthroscopy (Left, 03/22/2016); Tendon repair (Left, 03/22/2016); Lumbar laminectomy/decompression microdiscectomy (N/A, 05/30/2017); Anterior cervical decomp/discectomy fusion (N/A, 04/09/2019); and Lumbar laminectomy/decompression microdiscectomy (N/A, 05/27/2021). Family: family history includes COPD in her father; Diabetes in her mother; Heart disease in her father; Hypertension in her father and mother.  Imaging review: LUMBAR SPINE - COMPLETE WITH BENDING VIEWS   COMPARISON:  MRI lumbar spine 08/29/2017   FINDINGS: There are 5 non-rib-bearing lumbar-type vertebral bodies. Mild dextrocurvature centered at L4-5 with severe left L4-5 disc space narrowing seen on frontal view. Vertebral body heights are maintained.   Normal sagittal alignment. Mild-to-moderate T9-10 and T10-11, mild T11-12, mild-to-moderate T12-L1, mild L1-2, mild posterior L2-3 through L4-5, and moderate posterior and mild anterior L5-S1 disc space narrowing.   Facet joint arthropathy is greatest at L5-S1.   Cholecystectomy clips.   IMPRESSION: 1. Normal alignment. 2. Vertebral body heights are maintained. 3. Multilevel degenerative disc changes, greatest at L5-S1 and the left L4-5 level.  CLINICAL DATA:  Right knee pain/arthralgia. Chronic  pain. Worse after falling 11/27/2021.   EXAM: RIGHT KNEE - 1-2 VIEW   COMPARISON:  None Available.   FINDINGS: Minimal superior patellar degenerative osteophytosis. No significant joint space narrowing. Mildly decreased bone mineralization. No joint effusion. Minimal chronic enthesopathic change at the quadriceps insertion on the patella. No acute fracture or dislocation.   IMPRESSION: 1. Minimal superior patellar degenerative osteophyte. 2. No acute fracture.  Narrative & Impression  CLINICAL DATA:  Chronic knee pain.  Worse after fall 11/27/2021.   EXAM: LEFT KNEE - 1-2 VIEW   COMPARISON:  None Available.   FINDINGS: Mild medial compartment joint space narrowing and peripheral degenerative osteophytes. Minimal superior patellar degenerative osteophytosis. Minimal chronic enthesopathic change at the quadriceps insertion on the patella. No joint effusion. No acute fracture or dislocation. Mildly decreased bone mineralization.   IMPRESSION: Mild medial and minimal patellofemoral osteoarthritis. No acute fracture.    CLINICAL DATA:  Right knee pain/arthralgia. History of chronic pain. Worse after falling 11/27/2021.   EXAM: DG HIP (WITH OR WITHOUT PELVIS) 2-3V RIGHT; DG HIP (WITH OR WITHOUT PELVIS) 2-3V LEFT   COMPARISON:  None Available.   FINDINGS: There is diffuse decreased bone mineralization. Mild bilateral superior femoroacetabular joint space narrowing. Mild bilateral superolateral acetabular degenerative osteophytosis. Mild pubic symphysis joint space narrowing and subchondral sclerosis degenerative change. Mild bilateral inferior sacroiliac joint space narrowing and subchondral sclerosis.   Minimal right-greater-than-left convexity of the anterior superior femoral head-neck junctions.   IMPRESSION: 1. Mild bilateral femoroacetabular, mild bilateral sacroiliac, and mild pubic symphysis osteoarthritis. 2. Minimal right-greater-than-left convexity of  the anterior superior femoral head-neck junctions, a CAM-type bump morphology that mildly increases propensity for CAM-type femoroacetabular impingement.  Laboratory Chemistry Profile   Renal Lab Results  Component Value Date   BUN  9 05/23/2021   CREATININE 0.73 05/23/2021   GFRAA >60 04/02/2019   GFRNONAA >60 05/23/2021    Hepatic No results found for: "AST", "ALT", "ALBUMIN", "ALKPHOS", "HCVAB", "AMYLASE", "LIPASE", "AMMONIA"  Electrolytes Lab Results  Component Value Date   NA 133 (L) 05/23/2021   K 3.9 05/23/2021   CL 95 (L) 05/23/2021   CALCIUM 9.0 05/23/2021    Bone No results found for: "VD25OH", "VD125OH2TOT", "KG4010UV2", "ZD6644IH4", "25OHVITD1", "25OHVITD2", "25OHVITD3", "TESTOFREE", "TESTOSTERONE"  Inflammation (CRP: Acute Phase) (ESR: Chronic Phase) No results found for: "CRP", "ESRSEDRATE", "LATICACIDVEN"       Note: Above Lab results reviewed.   Physical Exam  General appearance: Well nourished, well developed, and well hydrated. In no apparent acute distress Mental status: Alert, oriented x 3 (person, place, & time)       Respiratory: No evidence of acute respiratory distress Eyes: PERLA Vitals: BP (!) 149/83   Pulse 76   Temp (!) 97.2 F (36.2 C) (Temporal)   Ht _0  (1.676 m)   Wt 183 lb (83 kg)   SpO2 94%   BMI 29.54 kg/m  BMI: Estimated body mass index is 29.54 kg/m as calculated from the following:   Height as of this encounter: _1  (1.676 m).   Weight as of this encounter: 183 lb (83 kg). Ideal: Ideal body weight: 59.3 kg (130 lb 11.7 oz) Adjusted ideal body weight: 68.8 kg (151 lb 10.2 oz)  Thoracic Spine Area Exam  Skin & Axial Inspection: No masses, redness, or swelling Alignment: Symmetrical Functional ROM: Pain restricted ROM Stability: No instability detected Muscle Tone/Strength: Functionally intact. No obvious neuro-muscular anomalies detected. Sensory (Neurological): Neurogenic pain pattern Muscle strength & Tone: No  palpable anomalies Lumbar Spine Area Exam  Skin & Axial Inspection: No masses, redness, or swelling Alignment: Symmetrical Functional ROM: Pain restricted ROM  Stability: No instability detected Muscle Tone/Strength: Functionally intact. No obvious neuro-muscular anomalies detected. Sensory (Neurological): Neurogenic pain pattern left lower extremity  Gait & Posture Assessment  Ambulation: Unassisted Gait: Antalgic Posture: Difficulty standing up straight, due to pain  Lower Extremity Exam    Side: Right lower extremity  Side: Left lower extremity  Stability: No instability observed          Stability: No instability observed          Skin & Extremity Inspection: Skin color, temperature, and hair growth are WNL. No peripheral edema or cyanosis. No masses, redness, swelling, asymmetry, or associated skin lesions. No contractures.  Skin & Extremity Inspection: Skin color, temperature, and hair growth are WNL. No peripheral edema or cyanosis. No masses, redness, swelling, asymmetry, or associated skin lesions. No contractures.  Functional ROM: Pain restricted ROM for hip joint          Functional ROM: Pain restricted ROM for hip joint          Muscle Tone/Strength: Functionally intact. No obvious neuro-muscular anomalies detected.  Muscle Tone/Strength: Functionally intact. No obvious neuro-muscular anomalies detected.  Sensory (Neurological): Neurogenic pain pattern        Sensory (Neurological): Neurogenic pain pattern        DTR: Patellar: deferred today Achilles: deferred today Plantar: deferred today  DTR: Patellar: deferred today Achilles: deferred today Plantar: deferred today  Palpation: No palpable anomalies  Palpation: No palpable anomalies    Assessment   Diagnosis  1. Chronic radicular cervical pain   2. DDD (degenerative disc disease), cervical   3. S/P cervical spinal fusion (C3-C4 ACDF (03/2019)   4. Inflammatory  spondylopathy of cervical region ([place])   5. Cervical  facet joint syndrome   6. Lumbar radiculopathy   7. Thoracic disc herniation   8. Radicular pain of thoracic region   9. Chronic pain syndrome          Plan of Care   Ms. Muskaan D Santoni has a current medication list which includes the following long-term medication(s): amlodipine, atorvastatin, duloxetine, fluticasone-salmeterol, hydrochlorothiazide, metformin, sulfasalazine, tizanidine, valsartan, gabapentin, [START ON 07/04/2022] hydrocodone-acetaminophen, [START ON 08/03/2022] hydrocodone-acetaminophen, [START ON 09/02/2022] hydrocodone-acetaminophen, and metformin.  Pharmacotherapy (Medications Ordered): Meds ordered this encounter  Medications   HYDROcodone-acetaminophen (NORCO) 7.5-325 MG tablet    Sig: Take 1 tablet by mouth every 8 (eight) hours as needed for severe pain. Must last 30 days.    Dispense:  90 tablet    Refill:  0    Chronic Pain: STOP Act (Not applicable) Fill 1 day early if closed on refill date. Avoid benzodiazepines within 8 hours of opioids   HYDROcodone-acetaminophen (NORCO) 7.5-325 MG tablet    Sig: Take 1 tablet by mouth every 8 (eight) hours as needed for severe pain. Must last 30 days.    Dispense:  90 tablet    Refill:  0    Chronic Pain: STOP Act (Not applicable) Fill 1 day early if closed on refill date. Avoid benzodiazepines within 8 hours of opioids   HYDROcodone-acetaminophen (NORCO) 7.5-325 MG tablet    Sig: Take 1 tablet by mouth every 8 (eight) hours as needed for severe pain. Must last 30 days.    Dispense:  90 tablet    Refill:  0    Chronic Pain: STOP Act (Not applicable) Fill 1 day early if closed on refill date. Avoid benzodiazepines within 8 hours of opioids   gabapentin (NEURONTIN) 300 MG capsule    Sig: Take 3 capsules (900 mg total) by mouth 2 (two) times daily.    Dispense:  180 capsule    Refill:  2    Orders:  No orders of the defined types were placed in this encounter.  Follow-up plan:   Return in about 3 months (around  10/04/2022) for Medication Management, in person.    Recent Visits Date Type Provider Dept  03/28/22 Office Visit Gillis Santa, MD [facility]  Showing recent visits within past 90 days and meeting all other requirements Today's Visits Date Type Provider Dept  06/20/22 Office Visit Gillis Santa, MD [facility]  Showing today's visits and meeting all other requirements Future Appointments No visits were found meeting these conditions. Showing future appointments within next 90 days and meeting all other requirements  I discussed the assessment and treatment plan with the patient. The patient was provided an opportunity to ask questions and all were answered. The patient agreed with the plan and demonstrated an understanding of the instructions.  Patient advised to call back or seek an in-person evaluation if the symptoms or condition worsens.  Duration of encounter: 30mnutes.  Note by: BGillis Santa MD Date: 06/20/2022; Time: 8:48 AM

## 2022-06-21 ENCOUNTER — Telehealth: Payer: Self-pay

## 2022-06-21 NOTE — Telephone Encounter (Signed)
We have gotten a message from Dr. Karel Jarvis for Lumbar radiculopathy, please schedule a follow up appt with Dr. Izora Ribas. He has something available 06/22/22 at 10:30 or 2:45, if she can do that.

## 2022-06-21 NOTE — Telephone Encounter (Signed)
Patient confirmed appt for 06/22/22 at 10:30am.

## 2022-06-21 NOTE — Telephone Encounter (Signed)
Left message to call back  

## 2022-06-22 ENCOUNTER — Encounter: Payer: Medicare Other | Admitting: Student in an Organized Health Care Education/Training Program

## 2022-06-22 ENCOUNTER — Encounter: Payer: Self-pay | Admitting: Neurosurgery

## 2022-06-22 ENCOUNTER — Ambulatory Visit (INDEPENDENT_AMBULATORY_CARE_PROVIDER_SITE_OTHER): Payer: Medicare Other | Admitting: Neurosurgery

## 2022-06-22 VITALS — BP 144/84 | Ht 66.0 in | Wt 183.6 lb

## 2022-06-22 DIAGNOSIS — M5416 Radiculopathy, lumbar region: Secondary | ICD-10-CM

## 2022-06-22 NOTE — Progress Notes (Signed)
   DOS: 05/27/21 L4-S1 decompression   HISTORY OF PRESENT ILLNESS: 06/22/2022 [name] is status post the above surgery.  She has continued to have issues with pain after surgery.  She is under management with Dr. Holley Raring for pain management.  She continues have difficulty with walking and doing her day-to-day activities.  She has significant left leg pain when she lays on her right side.  She has been seen by orthopedics, and had a recent MRI scan.  PHYSICAL EXAMINATION:   Vitals:   06/22/22 1018  BP: (!) 144/84   General: Patient is well developed, well nourished, calm, collected, and in no apparent distress.  NEUROLOGICAL:  General: In no acute distress.  Awake, alert, oriented to person, place, and time. Pupils equal round and reactive to light.   Strength:  Side Iliopsoas Quads Hamstring PF DF EHL  R 5 5 5 5 5 5   L 5 5 5 5 5 5    Incision c/d/i   ROS (Neurologic): Negative except as noted above  IMAGING: No interval imaging to review   EMG 04/13/2021 Impression: This is an abnormal electrodiagnostic study consistent with a 1) generalized sensory polyneuropathy. 2) acute on chronic left S1 radiculopathy. 3) chronic left L5 radiculopathy.   Thank you for the referral of this patient. It was our privilege to participate in care of your patient. Feel free to contact us with any further questions.  _____________________________ Jennings Books, M.D.   ASSESSMENT/PLAN:  Kaelee Pfeffer continues to suffer from a chronic radiculopathy.  I do not think that further spine surgery is likely to improve her condition, as she has now been having similar leg pain for many years and she has EMG confirmation of chronic nerve pain.  Other than medical management, the other consideration would be peripheral nerve stimulation.  She previously had a spinal cord stimulator evaluation which did not help.  I will contact her pain management provider to see whether he think this would be a good  idea.  Meade Maw MD, [facility] Department of Neurosurgery

## 2022-07-18 ENCOUNTER — Other Ambulatory Visit: Payer: Self-pay | Admitting: Student in an Organized Health Care Education/Training Program

## 2022-07-18 DIAGNOSIS — M5124 Other intervertebral disc displacement, thoracic region: Secondary | ICD-10-CM

## 2022-07-18 DIAGNOSIS — G894 Chronic pain syndrome: Secondary | ICD-10-CM

## 2022-07-18 DIAGNOSIS — G8929 Other chronic pain: Secondary | ICD-10-CM

## 2022-07-18 DIAGNOSIS — M5030 Other cervical disc degeneration, unspecified cervical region: Secondary | ICD-10-CM

## 2022-07-18 DIAGNOSIS — M5414 Radiculopathy, thoracic region: Secondary | ICD-10-CM

## 2022-07-18 DIAGNOSIS — M4692 Unspecified inflammatory spondylopathy, cervical region: Secondary | ICD-10-CM

## 2022-09-27 ENCOUNTER — Other Ambulatory Visit: Payer: Self-pay | Admitting: Internal Medicine

## 2022-09-27 DIAGNOSIS — R0789 Other chest pain: Secondary | ICD-10-CM

## 2022-09-27 DIAGNOSIS — I70219 Atherosclerosis of native arteries of extremities with intermittent claudication, unspecified extremity: Secondary | ICD-10-CM

## 2022-09-27 DIAGNOSIS — I700 Atherosclerosis of aorta: Secondary | ICD-10-CM

## 2022-09-27 DIAGNOSIS — I160 Hypertensive urgency: Secondary | ICD-10-CM

## 2022-09-28 ENCOUNTER — Encounter: Payer: Self-pay | Admitting: Student in an Organized Health Care Education/Training Program

## 2022-09-28 ENCOUNTER — Ambulatory Visit
Payer: Medicare Other | Attending: Student in an Organized Health Care Education/Training Program | Admitting: Student in an Organized Health Care Education/Training Program

## 2022-09-28 VITALS — BP 123/79 | HR 68 | Temp 97.3°F | Resp 18 | Ht 66.5 in | Wt 186.0 lb

## 2022-09-28 DIAGNOSIS — M5124 Other intervertebral disc displacement, thoracic region: Secondary | ICD-10-CM | POA: Insufficient documentation

## 2022-09-28 DIAGNOSIS — M5412 Radiculopathy, cervical region: Secondary | ICD-10-CM | POA: Diagnosis present

## 2022-09-28 DIAGNOSIS — G894 Chronic pain syndrome: Secondary | ICD-10-CM | POA: Diagnosis present

## 2022-09-28 DIAGNOSIS — M4692 Unspecified inflammatory spondylopathy, cervical region: Secondary | ICD-10-CM | POA: Insufficient documentation

## 2022-09-28 DIAGNOSIS — M5416 Radiculopathy, lumbar region: Secondary | ICD-10-CM | POA: Diagnosis present

## 2022-09-28 DIAGNOSIS — M5414 Radiculopathy, thoracic region: Secondary | ICD-10-CM | POA: Insufficient documentation

## 2022-09-28 DIAGNOSIS — M48062 Spinal stenosis, lumbar region with neurogenic claudication: Secondary | ICD-10-CM | POA: Insufficient documentation

## 2022-09-28 DIAGNOSIS — M5030 Other cervical disc degeneration, unspecified cervical region: Secondary | ICD-10-CM | POA: Diagnosis present

## 2022-09-28 DIAGNOSIS — G8929 Other chronic pain: Secondary | ICD-10-CM | POA: Diagnosis present

## 2022-09-28 MED ORDER — HYDROCODONE-ACETAMINOPHEN 7.5-325 MG PO TABS: 1.0000 | ORAL_TABLET | Freq: Three times a day (TID) | ORAL | 0 refills | Status: DC | PRN

## 2022-09-28 MED ORDER — GABAPENTIN 300 MG PO CAPS: 900.0000 mg | ORAL_CAPSULE | Freq: Two times a day (BID) | ORAL | 2 refills | Status: DC

## 2022-09-28 NOTE — Patient Instructions (Addendum)

## 2022-09-28 NOTE — Progress Notes (Signed)
Nursing Pain Medication Assessment:  Safety precautions to be maintained throughout the outpatient stay will include: orient to surroundings, keep bed in low position, maintain call bell within reach at all times, provide assistance with transfer out of bed and ambulation.  Medication Inspection Compliance: Pill count conducted under aseptic conditions, in front of the patient. Neither the pills nor the bottle was removed from the patient's sight at any time. Once count was completed pills were immediately returned to the patient in their original bottle.  Medication: Hydrocodone/APAP Pill/Patch Count:  34 of 90 pills remain Pill/Patch Appearance: Markings consistent with prescribed medication Bottle Appearance: Standard pharmacy container. Clearly labeled. Filled Date: 02 / 06 / 2024 Last Medication intake:  Yesterday

## 2022-09-28 NOTE — Progress Notes (Signed)
PROVIDER NOTE: Information contained herein reflects review and annotations entered in association with encounter. Interpretation of such information and data should be left to medically-trained personnel. Information provided to patient can be located elsewhere in the medical record under "Patient Instructions". Document created using STT-dictation technology, any transcriptional errors that may result from process are unintentional.    Patient: Tammy Lamb  Service Category: E/M  Provider: Gillis Santa, MD  DOB: [birth year]  DOS: 09/28/2022  Specialty: Interventional Pain Management  MRN: [id number]  Setting: Ambulatory outpatient  PCP: Gladstone Lighter, MD  Type: Established Patient    Referring Provider: Gladstone Lighter, MD  Location: Office  Delivery: Face-to-face     HPI  Tammy Lamb, a 72 y.o. year old female, is here today because of her Lumbar radiculopathy [M54.16]. 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 Pain Assessment: Severity of Chronic pain is reported as a 7 /10. Location: Back Left, Lower/to left hip; down BOTH legs to toes. Onset: More than a month ago. Quality: Burning, Other (Comment) ("feels like someone has their fist in my back"). Timing: Constant. Modifying factor(s): meds. Vitals:  height is 5' 6.5" (1.689 m) and weight is 186 lb (84.4 kg). Her temporal  temperature is 97.3 F (36.3 C) (abnormal). Her blood pressure is 123/79 and her pulse is 68. Her respiration is 18 and oxygen saturation is 99%.   Reason for encounter: medication management.   Patient presents today for medication management.  No significant change in her medical history.  She states that her primary care provider has adjusted her blood pressure medications due to hypertension.  She saw Dr. Cari Caraway in November 2023.  She continues to endorse low back pain with radiation into her left hip and left leg that is dermatomal in nature.  Today we discussed spinal cord stimulation as a potential treatment option for her persistent and severe lumbar radicular pain.  I provided her with resources regarding spinal cord stimulation and patient states that she will think about this further.  She is also complaining of cervical spine pain with radiation into her left shoulder related to cervical radiculopathy in the context of having a C3-C4 ACDF.  We discussed a left cervical epidural steroid injection.  Risk and benefits reviewed and patient would like to proceed with that.  Refill of hydrocodone and gabapentin as below, no change in dose, renew annual urine toxicology screen today for medication compliance and monitoring.   Pharmacotherapy Assessment  Analgesic: Hydrocodone 7.'5mg'$  TID prn 90/month     Monitoring: [place] PMP: PDMP reviewed during this encounter.       Pharmacotherapy: No side-effects or adverse reactions reported. Compliance: No problems identified. Effectiveness: Clinically acceptable.  Rise Patience, RN  09/28/2022 10:34 AM  Sign when Signing Visit Nursing Pain Medication Assessment:  Safety precautions to be maintained throughout the outpatient stay will include: orient to surroundings, keep bed in low position, maintain call bell within reach at all times, provide assistance with transfer out of bed and ambulation.  Medication Inspection Compliance: Pill count  conducted under aseptic conditions, in front of the patient. Neither the pills nor the bottle was removed from the patient's sight at any time. Once count was completed pills were immediately returned to the patient in their original bottle.  Medication: Hydrocodone/APAP Pill/Patch Count:  34 of 90 pills remain Pill/Patch Appearance: Markings consistent with prescribed medication Bottle Appearance: Standard pharmacy container. Clearly labeled. Filled Date: 02 / 06 / 2024 Last Medication intake:  Yesterday     UDS:  Summary  Date Value Ref Range Status  08/25/2021 Note  Final    Comment:    ==================================================================== [employer] Select 13 (MW) ==================================================================== Test                             Result       Flag       Units  Drug Present and Declared for Prescription Verification   Hydrocodone                    512          EXPECTED   ng/mg creat   Norhydrocodone                 2435         EXPECTED   ng/mg creat    Sources of hydrocodone include scheduled prescription medications.    Norhydrocodone is an expected metabolite of hydrocodone.  ==================================================================== Test                      Result    Flag   Units      Ref Range   Creatinine              34               mg/dL      >=20 ==================================================================== Declared Medications:  The flagging and interpretation on this report are based on the  following declared medications.  Unexpected results may arise from  inaccuracies in the declared medications.   **Note: The testing scope of this panel includes these medications:   Hydrocodone (Norco)   **Note: The testing scope of this panel does not include the  following reported medications:   Acetaminophen (Tylenol)  Acetaminophen (Norco)  Albuterol  Amitriptyline (Elavil)  Amlodipine (Norvasc)   Atorvastatin (Lipitor)  Celecoxib (Celebrex)  Cephalexin (Keflex)  Cholecalciferol  Cinnamon  Citalopram (Celexa)  Cyanocobalamin  Fluticasone (Advair)  Gabapentin (Neurontin)  Hydrochlorothiazide  Hydroxyzine (Atarax)  Magnesium (Mag-Ox)  Metformin  Omeprazole  Oxybutynin  Pentosan  Polyethylene Glycol (MiraLAX)  Salmeterol (Advair)  Sodium Bicarbonate  Sulfasalazine (Azulfidine)  Tizanidine  Valsartan (Diovan)  Vitamin C ==================================================================== For clinical consultation, please call [phone]. ====================================================================      ROS  Constitutional: Denies any fever or chills Gastrointestinal: No reported hemesis, hematochezia, vomiting, or acute GI distress Musculoskeletal: Left cervical spine pain with radiation into left shoulder; low back, left hip, left greater than right knee pain Neurological: No reported episodes of acute onset apraxia, aphasia, dysarthria, agnosia, amnesia, paralysis, loss of coordination, or loss of consciousness  Medication Review  Cholecalciferol, Cinnamon, DULoxetine, Fish Oil, HYDROcodone-acetaminophen, Omeprazole-Sodium Bicarbonate, acetaminophen, albuterol, amLODipine, ascorbic acid, atorvastatin, cephALEXin, citalopram, cloNIDine, cyanocobalamin, fluticasone-salmeterol, gabapentin, hydrOXYzine, hydrochlorothiazide, magnesium oxide, oxybutynin, ramipril, sulfaSALAzine, tiZANidine, and valsartan  History Review  Allergy: Tammy Lamb is allergic to aspirin and sulfa antibiotics. Drug: Tammy Lamb  reports no history of drug use. Alcohol:  reports  that she does not currently use alcohol. Tobacco:  reports that she has been smoking cigarettes. She has a 24.50 pack-year smoking history. She has never used smokeless tobacco. Social: Tammy Lamb  reports that she has been smoking cigarettes. She has a 24.50 pack-year smoking history. She has never used smokeless  tobacco. She reports that she does not currently use alcohol. She reports that she does not use drugs. Medical:  has a past medical history of Anemia, Anxiety, Arthritis, Bilateral carotid artery stenosis, Chronic lower back pain, Colitis, ulcerative ([place]), COPD (chronic obstructive pulmonary disease) ([place]), Depression, Diabetes mellitus without complication ([place]), GERD (gastroesophageal reflux disease), H/O bladder infections, History of 2019 novel coronavirus disease (COVID-19) (02/2021), History of hiatal hernia, HLD (hyperlipidemia), Hypertension, Interstitial cystitis, Low sodium levels, Motion sickness, Parkinson's disease, PVD (peripheral vascular disease) ([place]), Sinus headache, Stroke ([place]) (2015), TIA (transient ischemic attack) (2015), Urinary frequency, and Wears dentures. Surgical: Tammy Lamb  has a past surgical history that includes Tonsillectomy; Cholecystectomy; Abdominal hysterectomy; Diagnostic laparoscopy; Bladder suspension; Colonoscopy; Upper gi endoscopy; Knee arthroscopy (Left, 02/13/2014); Ankle arthroscopy (Left, 03/22/2016); Tendon repair (Left, 03/22/2016); Lumbar laminectomy/decompression microdiscectomy (N/A, 05/30/2017); Anterior cervical decomp/discectomy fusion (N/A, 04/09/2019); and Lumbar laminectomy/decompression microdiscectomy (N/A, 05/27/2021). Family: family history includes COPD in her father; Diabetes in her mother; Heart disease in her father; Hypertension in her father and mother.  Imaging review: LUMBAR SPINE - COMPLETE WITH BENDING VIEWS   COMPARISON:  MRI lumbar spine 08/29/2017   FINDINGS: There are 5 non-rib-bearing lumbar-type vertebral bodies. Mild dextrocurvature centered at L4-5 with severe left L4-5 disc space narrowing seen on frontal view. Vertebral body heights are maintained.   Normal sagittal alignment. Mild-to-moderate T9-10 and T10-11, mild T11-12, mild-to-moderate T12-L1, mild L1-2, mild posterior L2-3 through L4-5, and moderate posterior and  mild anterior L5-S1 disc space narrowing.   Facet joint arthropathy is greatest at L5-S1.   Cholecystectomy clips.   IMPRESSION: 1. Normal alignment. 2. Vertebral body heights are maintained. 3. Multilevel degenerative disc changes, greatest at L5-S1 and the left L4-5 level.  CLINICAL DATA:  Right knee pain/arthralgia. Chronic pain. Worse after falling 11/27/2021.   EXAM: RIGHT KNEE - 1-2 VIEW   COMPARISON:  None Available.   FINDINGS: Minimal superior patellar degenerative osteophytosis. No significant joint space narrowing. Mildly decreased bone mineralization. No joint effusion. Minimal chronic enthesopathic change at the quadriceps insertion on the patella. No acute fracture or dislocation.   IMPRESSION: 1. Minimal superior patellar degenerative osteophyte. 2. No acute fracture.  Narrative & Impression  CLINICAL DATA:  Chronic knee pain.  Worse after fall 11/27/2021.   EXAM: LEFT KNEE - 1-2 VIEW   COMPARISON:  None Available.   FINDINGS: Mild medial compartment joint space narrowing and peripheral degenerative osteophytes. Minimal superior patellar degenerative osteophytosis. Minimal chronic enthesopathic change at the quadriceps insertion on the patella. No joint effusion. No acute fracture or dislocation. Mildly decreased bone mineralization.   IMPRESSION: Mild medial and minimal patellofemoral osteoarthritis. No acute fracture.    CLINICAL DATA:  Right knee pain/arthralgia. History of chronic pain. Worse after falling 11/27/2021.   EXAM: DG HIP (WITH OR WITHOUT PELVIS) 2-3V RIGHT; DG HIP (WITH OR WITHOUT PELVIS) 2-3V LEFT   COMPARISON:  None Available.   FINDINGS: There is diffuse decreased bone mineralization. Mild bilateral superior femoroacetabular joint space narrowing. Mild bilateral superolateral acetabular degenerative osteophytosis. Mild pubic symphysis joint space narrowing and subchondral sclerosis degenerative change. Mild bilateral  inferior sacroiliac joint space narrowing and subchondral sclerosis.   Minimal right-greater-than-left convexity of  the anterior superior femoral head-neck junctions.   IMPRESSION: 1. Mild bilateral femoroacetabular, mild bilateral sacroiliac, and mild pubic symphysis osteoarthritis. 2. Minimal right-greater-than-left convexity of the anterior superior femoral head-neck junctions, a CAM-type bump morphology that mildly increases propensity for CAM-type femoroacetabular impingement.  Narrative & Impression  CLINICAL DATA:  72 year old female status post fusion in September last year. Right side neck pain. Left arm numbness and tingling.   EXAM: CT CERVICAL SPINE WITHOUT CONTRAST   TECHNIQUE: Multidetector CT imaging of the cervical spine was performed without intravenous contrast. Multiplanar CT image reconstructions were also generated.   COMPARISON:  Postoperative cervical spine MRI 10/10/2019.   FINDINGS: Alignment: Stable cervical lordosis from the March MRI. Cervicothoracic junction alignment is within normal limits. Bilateral posterior element alignment is within normal limits.   Skull base and vertebrae: Postoperative details below. Visualized skull base is intact. No atlanto-occipital dissociation. No acute osseous abnormality identified.   Soft tissues and spinal canal: Negative visible neck soft tissues aside from mild calcified carotid atherosclerosis. There is also calcified distal vertebral artery atherosclerosis at the skull base.   Disc levels:   C1-C2: Anterior joint space loss and osteophytosis.   C2-C3:  Disc bulging as seen by MRI.   C3-C4: Prior ACDF with no evidence of hardware loosening and solid appearing interbody arthrodesis (series 6, image 23). Residual endplate spurring most affecting the right C4 neural foramen (sagittal series 6, image 18).   C4-C5: Subtle anterolisthesis. Endplate spurring and mild facet hypertrophy with mild to  moderate left C5 foraminal stenosis.   C5-C6:  Negative.   C6-C7: Disc bulging better demonstrated by MRI. Mild endplate spurring mostly affecting the left C7 neural foramen (sagittal image 31).   C7-T1:  Mild facet hypertrophy on the left.   Upper chest: Evidence of centrilobular emphysema in the lung apices. Visible upper thoracic levels appear intact.   Other: Negative visible noncontrast posterior fossa.   IMPRESSION: 1. C3-C4 ACDF with solid interbody arthrodesis. Residual endplate spurring affects the right C4 neural foramen.   2. No acute osseous abnormality and generally mild for age cervical spine degeneration elsewhere appears stable from the March MRI.    Laboratory Chemistry Profile   Renal Lab Results  Component Value Date   BUN 9 05/23/2021   CREATININE 0.73 05/23/2021   GFRAA >60 04/02/2019   GFRNONAA >60 05/23/2021    Hepatic No results found for: "AST", "ALT", "ALBUMIN", "ALKPHOS", "HCVAB", "AMYLASE", "LIPASE", "AMMONIA"  Electrolytes Lab Results  Component Value Date   NA 133 (L) 05/23/2021   K 3.9 05/23/2021   CL 95 (L) 05/23/2021   CALCIUM 9.0 05/23/2021    Bone No results found for: "VD25OH", "VD125OH2TOT", "[id number]", "[id number]", "25OHVITD1", "25OHVITD2", "25OHVITD3", "TESTOFREE", "TESTOSTERONE"  Inflammation (CRP: Acute Phase) (ESR: Chronic Phase) No results found for: "CRP", "ESRSEDRATE", "LATICACIDVEN"       Note: Above Lab results reviewed.   Physical Exam  General appearance: Well nourished, well developed, and well hydrated. In no apparent acute distress Mental status: Alert, oriented x 3 (person, place, & time)       Respiratory: No evidence of acute respiratory distress Eyes: PERLA Vitals: BP 123/79   Pulse 68   Temp (!) 97.3 F (36.3 C) (Temporal)   Resp 18   Ht 5' 6.5" (1.689 m)   Wt 186 lb (84.4 kg)   SpO2 99%   BMI 29.57 kg/m  BMI: Estimated body mass index is 29.57 kg/m as calculated from the following:   Height  as of  this encounter: 5' 6.5" (1.689 m).   Weight as of this encounter: 186 lb (84.4 kg). Ideal: Ideal body weight: 60.5 kg (133 lb 4.3 oz) Adjusted ideal body weight: 70 kg (154 lb 5.8 oz)  Cervical Spine Area Exam  Skin & Axial Inspection: No masses, redness, edema, swelling, or associated skin lesions Alignment: Symmetrical Functional ROM: Pain restricted ROM, to the left Stability: No instability detected Muscle Tone/Strength: Functionally intact. No obvious neuro-muscular anomalies detected. Sensory (Neurological): Dermatomal pain pattern left Palpation: No palpable anomalies              Thoracic Spine Area Exam  Skin & Axial Inspection: No masses, redness, or swelling Alignment: Symmetrical Functional ROM: Pain restricted ROM Stability: No instability detected Muscle Tone/Strength: Functionally intact. No obvious neuro-muscular anomalies detected. Sensory (Neurological): Neurogenic pain pattern Muscle strength & Tone: No palpable anomalies Lumbar Spine Area Exam  Skin & Axial Inspection: Well healed scar from previous spine surgery detected Alignment: Symmetrical Functional ROM: Pain restricted ROM  Stability: No instability detected Muscle Tone/Strength: Functionally intact. No obvious neuro-muscular anomalies detected. Sensory (Neurological): Neurogenic pain pattern left lower extremity  Gait & Posture Assessment  Ambulation: Unassisted Gait: Antalgic Posture: Difficulty standing up straight, due to pain  Lower Extremity Exam    Side: Right lower extremity  Side: Left lower extremity  Stability: No instability observed          Stability: No instability observed          Skin & Extremity Inspection: Skin color, temperature, and hair growth are WNL. No peripheral edema or cyanosis. No masses, redness, swelling, asymmetry, or associated skin lesions. No contractures.  Skin & Extremity Inspection: Skin color, temperature, and hair growth are WNL. No peripheral edema or  cyanosis. No masses, redness, swelling, asymmetry, or associated skin lesions. No contractures.  Functional ROM: Pain restricted ROM for hip joint          Functional ROM: Pain restricted ROM for hip joint          Muscle Tone/Strength: Functionally intact. No obvious neuro-muscular anomalies detected.  Muscle Tone/Strength: Functionally intact. No obvious neuro-muscular anomalies detected.  Sensory (Neurological): Neurogenic pain pattern        Sensory (Neurological): Neurogenic pain pattern        DTR: Patellar: deferred today Achilles: deferred today Plantar: deferred today  DTR: Patellar: deferred today Achilles: deferred today Plantar: deferred today  Palpation: No palpable anomalies  Palpation: No palpable anomalies    Assessment   Diagnosis  1. Lumbar radiculopathy   2. Spinal stenosis, lumbar region, with neurogenic claudication   3. Inflammatory spondylopathy of cervical region ([place])   4. Chronic pain syndrome   5. DDD (degenerative disc disease), cervical   6. Thoracic disc herniation   7. Radicular pain of thoracic region   8. Chronic radicular cervical pain          Plan of Care   Tammy Lamb has a current medication list which includes the following long-term medication(s): amlodipine, atorvastatin, clonidine, duloxetine, fluticasone-salmeterol, sulfasalazine, tizanidine, valsartan, gabapentin, hydrochlorothiazide, [START ON 10/06/2022] hydrocodone-acetaminophen, [START ON 11/05/2022] hydrocodone-acetaminophen, and [START ON 12/05/2022] hydrocodone-acetaminophen.  Pharmacotherapy (Medications Ordered): Meds ordered this encounter  Medications   HYDROcodone-acetaminophen (NORCO) 7.5-325 MG tablet    Sig: Take 1 tablet by mouth every 8 (eight) hours as needed for severe pain. Must last 30 days.    Dispense:  90 tablet    Refill:  0    Chronic Pain: STOP Act (Not  applicable) Fill 1 day early if closed on refill date. Avoid benzodiazepines within 8 hours of  opioids   HYDROcodone-acetaminophen (NORCO) 7.5-325 MG tablet    Sig: Take 1 tablet by mouth every 8 (eight) hours as needed for severe pain. Must last 30 days.    Dispense:  90 tablet    Refill:  0    Chronic Pain: STOP Act (Not applicable) Fill 1 day early if closed on refill date. Avoid benzodiazepines within 8 hours of opioids   HYDROcodone-acetaminophen (NORCO) 7.5-325 MG tablet    Sig: Take 1 tablet by mouth every 8 (eight) hours as needed for severe pain. Must last 30 days.    Dispense:  90 tablet    Refill:  0    Chronic Pain: STOP Act (Not applicable) Fill 1 day early if closed on refill date. Avoid benzodiazepines within 8 hours of opioids   gabapentin (NEURONTIN) 300 MG capsule    Sig: Take 3 capsules (900 mg total) by mouth 2 (two) times daily.    Dispense:  180 capsule    Refill:  2    Orders:  Orders Placed This Encounter  Procedures   Cervical Epidural Injection    Sedation: Patient's choice. Purpose: Diagnostic/Therapeutic Indication(s): Radiculitis and cervicalgia associater with cervical degenerative disc disease.    Standing Status:   Future    Standing Expiration Date:   12/29/2022    Scheduling Instructions:     Procedure: Cervical Epidural Steroid Injection/Block     Level(s): C7-T1     Laterality: left     Timeframe: As soon as schedule allows    Order Specific Question:   Where will this procedure be performed?    Answer:   [facility]    Comments:   Cilicia Borden   [employer] Select 13 (MW), Urine    Volume: 30 ml(s). Minimum 3 ml of urine is needed. Document temperature of fresh sample. Indications: Long term (current) use of opiate analgesic [id number])    Order Specific Question:   Release to patient    Answer:   Immediate   Discussed spinal cord stimulation with patient, provided her with resources.  If she is interested, will need to repeat thoracic, lumbar MRI and obtain psychological clearance.  Follow-up plan:   Return in about 2 weeks (around  10/12/2022) for Left C-ESI, in clinic [facility].    Recent Visits No visits were found meeting these conditions. Showing recent visits within past 90 days and meeting all other requirements Today's Visits Date Type Provider Dept  09/28/22 Office Visit Gillis Santa, MD [facility]  Showing today's visits and meeting all other requirements Future Appointments Date Type Provider Dept  12/26/22 Appointment Gillis Santa, MD [facility]  Showing future appointments within next 90 days and meeting all other requirements  I discussed the assessment and treatment plan with the patient. The patient was provided an opportunity to ask questions and all were answered. The patient agreed with the plan and demonstrated an understanding of the instructions.  Patient advised to call back or seek an in-person evaluation if the symptoms or condition worsens.  Duration of encounter: 83mnutes.  Note by: BGillis Santa MD Date: 09/28/2022; Time: 11:18 AM

## 2022-10-02 LAB — TOXASSURE SELECT 13 (MW), URINE

## 2022-10-05 ENCOUNTER — Ambulatory Visit
Admission: RE | Admit: 2022-10-05 | Discharge: 2022-10-05 | Disposition: A | Discharge status: Home / Self Care | Payer: Self-pay | Source: Ambulatory Visit | Attending: Internal Medicine | Admitting: Internal Medicine

## 2022-10-05 DIAGNOSIS — I160 Hypertensive urgency: Secondary | ICD-10-CM | POA: Insufficient documentation

## 2022-10-05 DIAGNOSIS — R0789 Other chest pain: Secondary | ICD-10-CM | POA: Insufficient documentation

## 2022-10-05 DIAGNOSIS — I70219 Atherosclerosis of native arteries of extremities with intermittent claudication, unspecified extremity: Secondary | ICD-10-CM | POA: Insufficient documentation

## 2022-10-05 DIAGNOSIS — I700 Atherosclerosis of aorta: Secondary | ICD-10-CM | POA: Insufficient documentation

## 2022-10-05 DIAGNOSIS — I2510 Atherosclerotic heart disease of native coronary artery without angina pectoris: Secondary | ICD-10-CM

## 2022-10-05 HISTORY — DX: Atherosclerotic heart disease of native coronary artery without angina pectoris: I25.10

## 2022-10-18 ENCOUNTER — Encounter
Admission: EM | Disposition: A | Discharge status: Home / Self Care | Payer: Self-pay | Source: Home / Self Care | Attending: Obstetrics and Gynecology

## 2022-10-18 ENCOUNTER — Inpatient Hospital Stay
Admission: EM | Admit: 2022-10-18 | Discharge: 2022-10-20 | DRG: 322 | Disposition: A | Discharge status: Home / Self Care | Payer: Medicare Other | Attending: Obstetrics and Gynecology | Admitting: Obstetrics and Gynecology

## 2022-10-18 ENCOUNTER — Emergency Department: Payer: Medicare Other

## 2022-10-18 ENCOUNTER — Other Ambulatory Visit: Payer: Self-pay

## 2022-10-18 ENCOUNTER — Ambulatory Visit: Payer: Medicare Other | Attending: Cardiology | Admitting: Cardiology

## 2022-10-18 ENCOUNTER — Encounter: Payer: Self-pay | Admitting: Cardiology

## 2022-10-18 ENCOUNTER — Encounter: Payer: Self-pay | Admitting: Emergency Medicine

## 2022-10-18 VITALS — BP 112/78 | HR 106 | Ht 65.0 in | Wt 190.8 lb

## 2022-10-18 DIAGNOSIS — I10 Essential (primary) hypertension: Secondary | ICD-10-CM

## 2022-10-18 DIAGNOSIS — I2510 Atherosclerotic heart disease of native coronary artery without angina pectoris: Secondary | ICD-10-CM | POA: Insufficient documentation

## 2022-10-18 DIAGNOSIS — I200 Unstable angina: Secondary | ICD-10-CM | POA: Insufficient documentation

## 2022-10-18 DIAGNOSIS — Z7951 Long term (current) use of inhaled steroids: Secondary | ICD-10-CM

## 2022-10-18 DIAGNOSIS — I25110 Atherosclerotic heart disease of native coronary artery with unstable angina pectoris: Secondary | ICD-10-CM | POA: Diagnosis not present

## 2022-10-18 DIAGNOSIS — R079 Chest pain, unspecified: Secondary | ICD-10-CM | POA: Diagnosis not present

## 2022-10-18 DIAGNOSIS — J101 Influenza due to other identified influenza virus with other respiratory manifestations: Secondary | ICD-10-CM | POA: Diagnosis present

## 2022-10-18 DIAGNOSIS — M1990 Unspecified osteoarthritis, unspecified site: Secondary | ICD-10-CM | POA: Diagnosis present

## 2022-10-18 DIAGNOSIS — Z833 Family history of diabetes mellitus: Secondary | ICD-10-CM

## 2022-10-18 DIAGNOSIS — J441 Chronic obstructive pulmonary disease with (acute) exacerbation: Secondary | ICD-10-CM

## 2022-10-18 DIAGNOSIS — G894 Chronic pain syndrome: Secondary | ICD-10-CM | POA: Diagnosis present

## 2022-10-18 DIAGNOSIS — N179 Acute kidney failure, unspecified: Secondary | ICD-10-CM

## 2022-10-18 DIAGNOSIS — K5190 Ulcerative colitis, unspecified, without complications: Secondary | ICD-10-CM | POA: Diagnosis present

## 2022-10-18 DIAGNOSIS — B342 Coronavirus infection, unspecified: Secondary | ICD-10-CM | POA: Insufficient documentation

## 2022-10-18 DIAGNOSIS — Z825 Family history of asthma and other chronic lower respiratory diseases: Secondary | ICD-10-CM

## 2022-10-18 DIAGNOSIS — K51919 Ulcerative colitis, unspecified with unspecified complications: Secondary | ICD-10-CM

## 2022-10-18 DIAGNOSIS — Z8673 Personal history of transient ischemic attack (TIA), and cerebral infarction without residual deficits: Secondary | ICD-10-CM

## 2022-10-18 DIAGNOSIS — E785 Hyperlipidemia, unspecified: Secondary | ICD-10-CM | POA: Diagnosis present

## 2022-10-18 DIAGNOSIS — M545 Low back pain, unspecified: Secondary | ICD-10-CM | POA: Diagnosis present

## 2022-10-18 DIAGNOSIS — G20A1 Parkinson's disease without dyskinesia, without mention of fluctuations: Secondary | ICD-10-CM | POA: Diagnosis present

## 2022-10-18 DIAGNOSIS — Z955 Presence of coronary angioplasty implant and graft: Secondary | ICD-10-CM

## 2022-10-18 DIAGNOSIS — F17210 Nicotine dependence, cigarettes, uncomplicated: Secondary | ICD-10-CM | POA: Diagnosis present

## 2022-10-18 DIAGNOSIS — F17200 Nicotine dependence, unspecified, uncomplicated: Secondary | ICD-10-CM | POA: Diagnosis not present

## 2022-10-18 DIAGNOSIS — K219 Gastro-esophageal reflux disease without esophagitis: Secondary | ICD-10-CM | POA: Diagnosis present

## 2022-10-18 DIAGNOSIS — Z886 Allergy status to analgesic agent status: Secondary | ICD-10-CM

## 2022-10-18 DIAGNOSIS — Z79899 Other long term (current) drug therapy: Secondary | ICD-10-CM

## 2022-10-18 DIAGNOSIS — E1151 Type 2 diabetes mellitus with diabetic peripheral angiopathy without gangrene: Secondary | ICD-10-CM | POA: Diagnosis present

## 2022-10-18 DIAGNOSIS — G20C Parkinsonism, unspecified: Secondary | ICD-10-CM | POA: Diagnosis present

## 2022-10-18 DIAGNOSIS — Z8249 Family history of ischemic heart disease and other diseases of the circulatory system: Secondary | ICD-10-CM

## 2022-10-18 DIAGNOSIS — I6523 Occlusion and stenosis of bilateral carotid arteries: Secondary | ICD-10-CM | POA: Diagnosis present

## 2022-10-18 DIAGNOSIS — Z882 Allergy status to sulfonamides status: Secondary | ICD-10-CM

## 2022-10-18 DIAGNOSIS — Z8616 Personal history of COVID-19: Secondary | ICD-10-CM

## 2022-10-18 DIAGNOSIS — B9729 Other coronavirus as the cause of diseases classified elsewhere: Secondary | ICD-10-CM | POA: Diagnosis present

## 2022-10-18 DIAGNOSIS — E861 Hypovolemia: Secondary | ICD-10-CM | POA: Diagnosis present

## 2022-10-18 HISTORY — PX: CORONARY STENT INTERVENTION: CATH118234

## 2022-10-18 HISTORY — PX: LEFT HEART CATH AND CORONARY ANGIOGRAPHY: CATH118249

## 2022-10-18 HISTORY — DX: Atherosclerotic heart disease of native coronary artery without angina pectoris: I25.10

## 2022-10-18 LAB — TSH: TSH: 3.245 u[IU]/mL (ref 0.350–4.500)

## 2022-10-18 LAB — TROPONIN I (HIGH SENSITIVITY)
Troponin I (High Sensitivity): 14 ng/L (ref <18)
Troponin I (High Sensitivity): 41 ng/L — ABNORMAL HIGH (ref <18)
Troponin I (High Sensitivity): 44 ng/L — ABNORMAL HIGH (ref <18)

## 2022-10-18 LAB — BRAIN NATRIURETIC PEPTIDE: B Natriuretic Peptide: 114.6 pg/mL — ABNORMAL HIGH (ref 0.0–100.0)

## 2022-10-18 LAB — LIPID PANEL
Cholesterol: 178 mg/dL (ref 0–200)
HDL: 100 mg/dL (ref >40)
LDL Cholesterol: 62 mg/dL (ref 0–99)
Total CHOL/HDL Ratio: 1.8 RATIO
Triglycerides: 80 mg/dL (ref <150)
VLDL: 16 mg/dL (ref 0–40)

## 2022-10-18 LAB — CBC
HCT: 38.1 % (ref 36.0–46.0)
Hemoglobin: 12.2 g/dL (ref 12.0–15.0)
MCH: 27.9 pg (ref 26.0–34.0)
MCHC: 32 g/dL (ref 30.0–36.0)
MCV: 87 fL (ref 80.0–100.0)
Platelets: 291 10*3/uL (ref 150–400)
RBC: 4.38 MIL/uL (ref 3.87–5.11)
RDW: 15 % (ref 11.5–15.5)
WBC: 8.6 10*3/uL (ref 4.0–10.5)
nRBC: 0 % (ref 0.0–0.2)

## 2022-10-18 LAB — BASIC METABOLIC PANEL
Anion gap: 9 (ref 5–15)
BUN: 10 mg/dL (ref 8–23)
CO2: 27 mmol/L (ref 22–32)
Calcium: 9 mg/dL (ref 8.9–10.3)
Chloride: 96 mmol/L — ABNORMAL LOW (ref 98–111)
Creatinine, Ser: 1.08 mg/dL — ABNORMAL HIGH (ref 0.44–1.00)
GFR, Estimated: 55 mL/min — ABNORMAL LOW (ref >60)
Glucose, Bld: 84 mg/dL (ref 70–99)
Potassium: 3.7 mmol/L (ref 3.5–5.1)
Sodium: 132 mmol/L — ABNORMAL LOW (ref 135–145)

## 2022-10-18 LAB — POCT ACTIVATED CLOTTING TIME: Activated Clotting Time: 298 seconds

## 2022-10-18 LAB — SARS CORONAVIRUS 2 BY RT PCR: SARS Coronavirus 2 by RT PCR: NEGATIVE

## 2022-10-18 LAB — PROCALCITONIN: Procalcitonin: <0.1 ng/mL

## 2022-10-18 SURGERY — LEFT HEART CATH AND CORONARY ANGIOGRAPHY
Anesthesia: Moderate Sedation

## 2022-10-18 MED ORDER — SODIUM CHLORIDE 0.9% FLUSH
3.0000 mL | Freq: Two times a day (BID) | INTRAVENOUS | Status: DC
  Administered 2022-10-19: 3 mL via INTRAVENOUS

## 2022-10-18 MED ORDER — MOMETASONE FURO-FORMOTEROL FUM 200-5 MCG/ACT IN AERO
2.0000 | INHALATION_SPRAY | Freq: Two times a day (BID) | RESPIRATORY_TRACT | Status: DC
  Administered 2022-10-19 – 2022-10-20 (×2): 2 via RESPIRATORY_TRACT
  Filled 2022-10-18: qty 8.8

## 2022-10-18 MED ORDER — PREDNISONE 20 MG PO TABS
40.0000 mg | ORAL_TABLET | Freq: Every day | ORAL | Status: DC
  Administered 2022-10-19 – 2022-10-20 (×2): 40 mg via ORAL
  Filled 2022-10-18 (×2): qty 2

## 2022-10-18 MED ORDER — ATORVASTATIN CALCIUM 80 MG PO TABS
80.0000 mg | ORAL_TABLET | Freq: Every day | ORAL | Status: DC
  Administered 2022-10-18 – 2022-10-20 (×3): 80 mg via ORAL
  Filled 2022-10-18 (×3): qty 1

## 2022-10-18 MED ORDER — ASPIRIN 81 MG PO CHEW
CHEWABLE_TABLET | ORAL | Status: AC
  Filled 2022-10-18: qty 1

## 2022-10-18 MED ORDER — ONDANSETRON HCL 4 MG/2ML IJ SOLN: 4.0000 mg | Freq: Four times a day (QID) | INTRAMUSCULAR | Status: DC | PRN

## 2022-10-18 MED ORDER — METOPROLOL TARTRATE 25 MG PO TABS
25.0000 mg | ORAL_TABLET | Freq: Two times a day (BID) | ORAL | Status: DC
  Administered 2022-10-18 – 2022-10-20 (×4): 25 mg via ORAL
  Filled 2022-10-18 (×4): qty 1

## 2022-10-18 MED ORDER — DULOXETINE HCL 60 MG PO CPEP
60.0000 mg | ORAL_CAPSULE | Freq: Every day | ORAL | Status: DC
  Administered 2022-10-19 – 2022-10-20 (×2): 60 mg via ORAL
  Filled 2022-10-18: qty 1
  Filled 2022-10-18: qty 2
  Filled 2022-10-18: qty 1
  Filled 2022-10-18: qty 2

## 2022-10-18 MED ORDER — NITROGLYCERIN 1 MG/10 ML FOR IR/CATH LAB
INTRA_ARTERIAL | Status: AC
  Filled 2022-10-18: qty 10

## 2022-10-18 MED ORDER — BENZONATATE 100 MG PO CAPS
200.0000 mg | ORAL_CAPSULE | Freq: Three times a day (TID) | ORAL | Status: DC | PRN
  Administered 2022-10-18 (×2): 200 mg via ORAL
  Filled 2022-10-18 (×3): qty 2

## 2022-10-18 MED ORDER — VERAPAMIL HCL 2.5 MG/ML IV SOLN
INTRAVENOUS | Status: AC
  Filled 2022-10-18: qty 2

## 2022-10-18 MED ORDER — HEPARIN SODIUM (PORCINE) 1000 UNIT/ML IJ SOLN
INTRAMUSCULAR | Status: AC
  Filled 2022-10-18: qty 10

## 2022-10-18 MED ORDER — BUPROPION HCL ER (SR) 150 MG PO TB12
150.0000 mg | ORAL_TABLET | Freq: Two times a day (BID) | ORAL | Status: DC
  Administered 2022-10-18 – 2022-10-20 (×4): 150 mg via ORAL
  Filled 2022-10-18 (×4): qty 1

## 2022-10-18 MED ORDER — ACETAMINOPHEN 325 MG PO TABS
650.0000 mg | ORAL_TABLET | ORAL | Status: DC | PRN
  Administered 2022-10-18 – 2022-10-19 (×2): 650 mg via ORAL
  Filled 2022-10-18 (×2): qty 2

## 2022-10-18 MED ORDER — SODIUM CHLORIDE 0.9 % IV SOLN: 250.0000 mL | INTRAVENOUS | Status: DC | PRN

## 2022-10-18 MED ORDER — GABAPENTIN 300 MG PO CAPS
900.0000 mg | ORAL_CAPSULE | Freq: Two times a day (BID) | ORAL | Status: DC
  Administered 2022-10-18 – 2022-10-20 (×4): 900 mg via ORAL
  Filled 2022-10-18 (×4): qty 3

## 2022-10-18 MED ORDER — HYDROXYZINE HCL 25 MG PO TABS
25.0000 mg | ORAL_TABLET | Freq: Every day | ORAL | Status: DC
  Administered 2022-10-18 – 2022-10-19 (×2): 25 mg via ORAL
  Filled 2022-10-18 (×2): qty 1

## 2022-10-18 MED ORDER — AMLODIPINE BESYLATE 10 MG PO TABS
10.0000 mg | ORAL_TABLET | ORAL | Status: DC
  Administered 2022-10-19 – 2022-10-20 (×2): 10 mg via ORAL
  Filled 2022-10-18 (×2): qty 1

## 2022-10-18 MED ORDER — LACTATED RINGERS IV BOLUS: 500.0000 mL | Freq: Once | INTRAVENOUS | Status: DC

## 2022-10-18 MED ORDER — ENOXAPARIN SODIUM 40 MG/0.4ML IJ SOSY
40.0000 mg | PREFILLED_SYRINGE | INTRAMUSCULAR | Status: DC
  Administered 2022-10-19 – 2022-10-20 (×2): 40 mg via SUBCUTANEOUS
  Filled 2022-10-18 (×2): qty 0.4

## 2022-10-18 MED ORDER — CARBIDOPA-LEVODOPA 25-100 MG PO TABS
1.0000 | ORAL_TABLET | Freq: Three times a day (TID) | ORAL | Status: DC
  Administered 2022-10-18 – 2022-10-20 (×5): 1 via ORAL
  Filled 2022-10-18 (×5): qty 1

## 2022-10-18 MED ORDER — CLOPIDOGREL BISULFATE 75 MG PO TABS
ORAL_TABLET | ORAL | Status: AC
  Filled 2022-10-18: qty 8

## 2022-10-18 MED ORDER — SODIUM CHLORIDE 0.9 % WEIGHT BASED INFUSION: 3.0000 mL/kg/h | INTRAVENOUS | Status: DC

## 2022-10-18 MED ORDER — HEPARIN (PORCINE) IN NACL 1000-0.9 UT/500ML-% IV SOLN
INTRAVENOUS | Status: AC
  Filled 2022-10-18: qty 1000

## 2022-10-18 MED ORDER — MIDAZOLAM HCL 2 MG/2ML IJ SOLN
INTRAMUSCULAR | Status: AC
  Filled 2022-10-18: qty 2

## 2022-10-18 MED ORDER — PENTOSAN POLYSULFATE SODIUM 100 MG PO CAPS
100.0000 mg | ORAL_CAPSULE | Freq: Three times a day (TID) | ORAL | Status: DC
  Administered 2022-10-19 – 2022-10-20 (×4): 100 mg via ORAL
  Filled 2022-10-18 (×5): qty 1

## 2022-10-18 MED ORDER — IPRATROPIUM-ALBUTEROL 0.5-2.5 (3) MG/3ML IN SOLN
3.0000 mL | Freq: Four times a day (QID) | RESPIRATORY_TRACT | Status: DC
  Administered 2022-10-18 – 2022-10-19 (×3): 3 mL via RESPIRATORY_TRACT
  Filled 2022-10-18 (×3): qty 3

## 2022-10-18 MED ORDER — HYDROCODONE-ACETAMINOPHEN 7.5-325 MG PO TABS: 1.0000 | ORAL_TABLET | Freq: Three times a day (TID) | ORAL | Status: DC | PRN

## 2022-10-18 MED ORDER — CITALOPRAM HYDROBROMIDE 20 MG PO TABS
20.0000 mg | ORAL_TABLET | ORAL | Status: DC
  Administered 2022-10-19 – 2022-10-20 (×2): 20 mg via ORAL
  Filled 2022-10-18 (×2): qty 1

## 2022-10-18 MED ORDER — VERAPAMIL HCL 2.5 MG/ML IV SOLN
INTRAVENOUS | Status: DC | PRN
  Administered 2022-10-18 (×2): 2.5 mg via INTRA_ARTERIAL

## 2022-10-18 MED ORDER — NITROGLYCERIN 1 MG/10 ML FOR IR/CATH LAB
INTRA_ARTERIAL | Status: DC | PRN
  Administered 2022-10-18: 200 ug via INTRACORONARY

## 2022-10-18 MED ORDER — HEPARIN (PORCINE) IN NACL 1000-0.9 UT/500ML-% IV SOLN
INTRAVENOUS | Status: DC | PRN
  Administered 2022-10-18 (×2): 500 mL

## 2022-10-18 MED ORDER — MIDAZOLAM HCL 2 MG/2ML IJ SOLN
INTRAMUSCULAR | Status: DC | PRN
  Administered 2022-10-18 (×2): 1 mg via INTRAVENOUS

## 2022-10-18 MED ORDER — AMITRIPTYLINE HCL 25 MG PO TABS
25.0000 mg | ORAL_TABLET | Freq: Every day | ORAL | Status: DC
  Filled 2022-10-18: qty 1

## 2022-10-18 MED ORDER — SULFASALAZINE 500 MG PO TBEC
500.0000 mg | DELAYED_RELEASE_TABLET | Freq: Two times a day (BID) | ORAL | Status: DC
  Administered 2022-10-19 – 2022-10-20 (×3): 500 mg via ORAL
  Filled 2022-10-18 (×3): qty 1

## 2022-10-18 MED ORDER — CLOPIDOGREL BISULFATE 75 MG PO TABS
75.0000 mg | ORAL_TABLET | Freq: Every day | ORAL | Status: DC
  Administered 2022-10-19 – 2022-10-20 (×2): 75 mg via ORAL
  Filled 2022-10-18 (×2): qty 1

## 2022-10-18 MED ORDER — ASPIRIN 300 MG RE SUPP
300.0000 mg | RECTAL | Status: AC
  Filled 2022-10-18: qty 1

## 2022-10-18 MED ORDER — TIZANIDINE HCL 4 MG PO TABS: 4.0000 mg | ORAL_TABLET | Freq: Three times a day (TID) | ORAL | Status: DC | PRN

## 2022-10-18 MED ORDER — HEPARIN SODIUM (PORCINE) 1000 UNIT/ML IJ SOLN
INTRAMUSCULAR | Status: DC | PRN
  Administered 2022-10-18 (×2): 4000 [IU] via INTRAVENOUS

## 2022-10-18 MED ORDER — FENTANYL CITRATE (PF) 100 MCG/2ML IJ SOLN
INTRAMUSCULAR | Status: AC
  Filled 2022-10-18: qty 2

## 2022-10-18 MED ORDER — SODIUM CHLORIDE 0.9 % WEIGHT BASED INFUSION: 1.0000 mL/kg/h | INTRAVENOUS | Status: DC

## 2022-10-18 MED ORDER — ASPIRIN 81 MG PO TBEC
81.0000 mg | DELAYED_RELEASE_TABLET | Freq: Every day | ORAL | Status: DC
  Administered 2022-10-19 – 2022-10-20 (×2): 81 mg via ORAL
  Filled 2022-10-18 (×2): qty 1

## 2022-10-18 MED ORDER — ASPIRIN 81 MG PO CHEW
CHEWABLE_TABLET | ORAL | Status: DC | PRN
  Administered 2022-10-18: 81 mg via ORAL

## 2022-10-18 MED ORDER — PROPRANOLOL HCL 20 MG PO TABS: 20.0000 mg | ORAL_TABLET | Freq: Two times a day (BID) | ORAL | Status: DC

## 2022-10-18 MED ORDER — SODIUM CHLORIDE 0.9 % IV SOLN
250.0000 mL | INTRAVENOUS | Status: DC | PRN
  Administered 2022-10-18: 250 mL via INTRAVENOUS

## 2022-10-18 MED ORDER — SODIUM CHLORIDE 0.9% FLUSH: 3.0000 mL | INTRAVENOUS | Status: DC | PRN

## 2022-10-18 MED ORDER — IOHEXOL 300 MG/ML  SOLN
INTRAMUSCULAR | Status: DC | PRN
  Administered 2022-10-18: 100 mL

## 2022-10-18 MED ORDER — SODIUM CHLORIDE 0.9% FLUSH
3.0000 mL | Freq: Two times a day (BID) | INTRAVENOUS | Status: DC
  Administered 2022-10-18 – 2022-10-19 (×4): 3 mL via INTRAVENOUS

## 2022-10-18 MED ORDER — CLOPIDOGREL BISULFATE 75 MG PO TABS
ORAL_TABLET | ORAL | Status: DC | PRN
  Administered 2022-10-18: 600 mg via ORAL

## 2022-10-18 MED ORDER — NITROGLYCERIN 0.4 MG SL SUBL: 0.4000 mg | SUBLINGUAL_TABLET | SUBLINGUAL | Status: DC | PRN

## 2022-10-18 MED ORDER — ASPIRIN 81 MG PO CHEW: 324.0000 mg | CHEWABLE_TABLET | ORAL | Status: AC

## 2022-10-18 MED ORDER — FENTANYL CITRATE (PF) 100 MCG/2ML IJ SOLN
INTRAMUSCULAR | Status: DC | PRN
  Administered 2022-10-18: 25 ug via INTRAVENOUS

## 2022-10-18 MED ORDER — SODIUM CHLORIDE 0.9 % WEIGHT BASED INFUSION: 1.0000 mL/kg/h | INTRAVENOUS | Status: AC

## 2022-10-18 SURGICAL SUPPLY — 18 items
BALLN ~~LOC~~ TREK NEO RX 3.75X12 (BALLOONS) ×1
BALLOON ~~LOC~~ TREK NEO RX 3.75X12 (BALLOONS) IMPLANT
CATH INFINITI 5FR JK (CATHETERS) IMPLANT
CATH INFINITI JR4 5F (CATHETERS) IMPLANT
CATH LAUNCHER 5F JR4 (CATHETERS) IMPLANT
DEVICE RAD TR BAND REGULAR (VASCULAR PRODUCTS) IMPLANT
DRAPE BRACHIAL (DRAPES) IMPLANT
GLIDESHEATH SLEND SS 6F .021 (SHEATH) IMPLANT
GUIDEWIRE INQWIRE 1.5J.035X260 (WIRE) IMPLANT
INQWIRE 1.5J .035X260CM (WIRE) ×1
KIT ENCORE 26 ADVANTAGE (KITS) IMPLANT
PACK CARDIAC CATH (CUSTOM PROCEDURE TRAY) ×1 IMPLANT
PROTECTION STATION PRESSURIZED (MISCELLANEOUS) ×1
SET ATX-X65L (MISCELLANEOUS) IMPLANT
STATION PROTECTION PRESSURIZED (MISCELLANEOUS) IMPLANT
STENT ONYX FRONTIER 3.5X15 (Permanent Stent) IMPLANT
TUBING CIL FLEX 10 FLL-RA (TUBING) IMPLANT
WIRE RUNTHROUGH .014X180CM (WIRE) IMPLANT

## 2022-10-18 NOTE — H&P (View-Only) (Signed)
Cardiology Office Note:    Date:  10/18/2022   ID:  Tammy Lamb, DOB Aug 15, 1950, MRN [id number]  PCP:  Gladstone Lighter, [place] [facility] Cardiologist:  Kate Sable, MD     Referring MD: Gladstone Lighter, MD   Chief Complaint  Patient presents with   New Patient (Initial Visit)    CAD, Calcified Coronary, chest pain, SOB, Arm and Leg pain, dizziness     History of Present Illness:    Tammy Lamb is a 72 y.o. female with a hx of hypertension, CAD (LAD and RCA calcifications on chest CT), hyperlipidemia, current smoker x 40+ years, COPD who presented due to CAD and chest pain.  She states having persistent chest pain ongoing over the past 3 weeks.  Symptoms alcohol almost daily, not associated with exertion, usually wakes her up from sleep.  Father had a heart attack in his 55s.  She describes chest pain and dizziness today, complaining of chest pressure radiating to left shoulder during clinic visit today.  She still smokes, is working on quitting.  States aspirin causes gastric irritation.  calcium score/scan obtained 10/05/2022 showing elevated calcium score of 601 (LAD 383, RCA 217).   Past Medical History:  Diagnosis Date   Anemia    h/o   Anxiety    Arthritis    Bilateral carotid artery stenosis    Chronic lower back pain    Colitis, ulcerative (HCC)    COPD (chronic obstructive pulmonary disease) ([place])    Depression    Diabetes mellitus without complication (HCC)    GERD (gastroesophageal reflux disease)    H/O bladder infections    History of 2019 novel coronavirus disease (COVID-19) 02/2021   History of hiatal hernia    HLD (hyperlipidemia)    Hypertension    Interstitial cystitis    Low sodium levels    Motion sickness    fair rides   Parkinson's disease    suspected per dr Manuella Ghazi @ [facility] neuro   PVD (peripheral vascular disease) ([place])    Sinus headache    Stroke ([place]) 2015   mild stroke d/t coreg dose being too high and pt.  had severe hypotension.   TIA (transient ischemic attack) 2015   no deficits.  per pt due to low BP.   Urinary frequency    Wears dentures    partial upper and lower    Past Surgical History:  Procedure Laterality Date   ABDOMINAL HYSTERECTOMY     BSO   ANKLE ARTHROSCOPY Left 03/22/2016   Procedure: ANKLE ARTHROSCOPY DEBRODEMENT EXTENSIVE LEFT FLEXAR TENDON REPAIR;  Surgeon: [name], DPM;  Location: [place];  Service: Podiatry;  Laterality: Left;  WITH POPLITEAL   ANTERIOR CERVICAL DECOMP/DISCECTOMY FUSION N/A 04/09/2019   Procedure: ANTERIOR CERVICAL DECOMPRESSION/DISCECTOMY FUSION 1 LEVEL C3-4;  Surgeon: Meade Maw, MD;  Location: [facility];  Service: Neurosurgery;  Laterality: N/A;   BLADDER SUSPENSION     tac   CHOLECYSTECTOMY     COLONOSCOPY     DIAGNOSTIC LAPAROSCOPY     adhesions   KNEE ARTHROSCOPY Left 02/13/2014   Procedure: LEFT ARTHROSCOPY KNEE, PARTIAL MEDIAL MENISECTOMY AND PLICA, CHONDROPLASTY OF PATELLA FEMORAL JOINT;  Surgeon: Alta Corning, MD;  Location: [place];  Service: Orthopedics;  Laterality: Left;   LUMBAR LAMINECTOMY/DECOMPRESSION MICRODISCECTOMY N/A 05/30/2017   Procedure: LUMBAR LAMINECTOMY/DECOMPRESSION MICRODISCECTOMY 3 LEVELS-L3-S1;  Surgeon: Meade Maw, MD;  Location: [facility];  Service: Neurosurgery;  Laterality: N/A;  LUMBAR LAMINECTOMY/DECOMPRESSION MICRODISCECTOMY N/A 05/27/2021   Procedure: L4-S1 DECOMPRESSION;  Surgeon: Meade Maw, MD;  Location: [facility];  Service: Neurosurgery;  Laterality: N/A;   TENDON REPAIR Left 03/22/2016   Procedure: [place];  Surgeon: [name], DPM;  Location: [place];  Service: Podiatry;  Laterality: Left;   TONSILLECTOMY     UPPER GI ENDOSCOPY      Current Medications: Current Meds  Medication Sig   acetaminophen (TYLENOL) 500 MG tablet Take 1,000 mg by mouth every 6 (six) hours as needed for moderate pain or headache.    amitriptyline (ELAVIL) 25 MG tablet Take 25 mg by mouth at bedtime.   amLODipine (NORVASC) 5 MG tablet Take 10 mg by mouth every morning.   ascorbic acid (VITAMIN C) 1000 MG tablet Take 1,000 mg by mouth at bedtime.   atorvastatin (LIPITOR) 80 MG tablet Take 80 mg by mouth at bedtime.   buPROPion (WELLBUTRIN SR) 150 MG 12 hr tablet Take 150 mg by mouth 2 (two) times daily.   carbidopa-levodopa (SINEMET IR) 25-100 MG tablet Take 1 tablet by mouth 3 (three) times daily.   cephALEXin (KEFLEX) 250 MG capsule Take 250 mg by mouth every morning.   cholecalciferol (VITAMIN D) 25 MCG (1000 UNIT) tablet Take 1,000 Units by mouth at bedtime.   Cinnamon 500 MG TABS Take 500 mg by mouth in the morning and at bedtime.   citalopram (CELEXA) 20 MG tablet Take 20 mg by mouth every morning.   cloNIDine (CATAPRES) 0.1 MG tablet Take 0.1 mg by mouth daily as needed. Only take PM if HTN over 165   DULoxetine (CYMBALTA) 60 MG capsule Take by mouth.   fluticasone-salmeterol (ADVAIR) 250-50 MCG/ACT AEPB Inhale 1 puff into the lungs in the morning and at bedtime.   gabapentin (NEURONTIN) 300 MG capsule Take 3 capsules (900 mg total) by mouth 2 (two) times daily.   hydrochlorothiazide (HYDRODIURIL) 25 MG tablet Take 25 mg by mouth every morning.   HYDROcodone-acetaminophen (NORCO) 7.5-325 MG tablet Take 1 tablet by mouth every 8 (eight) hours as needed for severe pain. Must last 30 days.   hydrOXYzine (ATARAX/VISTARIL) 25 MG tablet Take 25 mg by mouth at bedtime.   magnesium oxide (MAG-OX) 400 MG tablet Take 400 mg by mouth at bedtime.   Melatonin 10 MG TABS Take 10 mg by mouth at bedtime.   Omega-3 Fatty Acids (FISH OIL) 1200 MG CAPS Take by mouth.   Omeprazole-Sodium Bicarbonate (ZEGERID) 20-1100 MG CAPS capsule Take 1 capsule by mouth in the morning and at bedtime.   oxybutynin (DITROPAN-XL) 10 MG 24 hr tablet Take 10 mg by mouth at bedtime.   pentosan polysulfate (ELMIRON) 100 MG capsule Take 100 mg by mouth 3  (three) times daily.   predniSONE (DELTASONE) 20 MG tablet Take 20 mg by mouth daily with breakfast.   propranolol (INDERAL) 20 MG tablet Take 20 mg by mouth 2 (two) times daily.   ramipril (ALTACE) 10 MG capsule Take 1 capsule by mouth daily.   sulfaSALAzine (AZULFIDINE) 500 MG tablet Take 500 mg by mouth 2 (two) times daily.   tiZANidine (ZANAFLEX) 4 MG tablet Take 1 tablet (4 mg total) by mouth every 8 (eight) hours as needed.   valsartan (DIOVAN) 80 MG tablet Take 80 mg by mouth at bedtime.   VENTOLIN HFA 108 (90 Base) MCG/ACT inhaler Inhale 2 puffs into the lungs every 6 (six) hours as needed for wheezing or shortness of breath.    vitamin B-12 (  CYANOCOBALAMIN) 1000 MCG tablet Take 1,000 mcg by mouth daily.     Allergies:   Aspirin and Sulfa antibiotics   Social History   Socioeconomic History   Marital status: Married    Spouse name: Not on file   Number of children: Not on file   Years of education: Not on file   Highest education level: Not on file  Occupational History   Not on file  Tobacco Use   Smoking status: Every Day    Packs/day: 0.50    Years: 49.00    Additional pack years: 0.00    Total pack years: 24.50    Types: Cigarettes   Smokeless tobacco: Never   Tobacco comments:    1 pack every 3-4 days 10-18-22  Vaping Use   Vaping Use: Never used  Substance and Sexual Activity   Alcohol use: Not Currently    Comment: Occassional,   Drug use: No   Sexual activity: Never  Other Topics Concern   Not on file  Social History Narrative   Not on file   Social Determinants of Health   Financial Resource Strain: Not on file  Food Insecurity: Not on file  Transportation Needs: Not on file  Physical Activity: Not on file  Stress: Not on file  Social Connections: Not on file     Family History: The patient's family history includes COPD in her father; Diabetes in her mother; Heart disease in her father; Hypertension in her father and mother. There is no history  of Breast cancer.  ROS:   Please see the history of present illness.     All other systems reviewed and are negative.  EKGs/Labs/Other Studies Reviewed:    The following studies were reviewed today:   EKG:  EKG is  ordered today.  The ekg ordered today demonstrates normal sinus rhythm, heart rate 83  Recent Labs: No results found for requested labs within last 365 days.  Recent Lipid Panel No results found for: "CHOL", "TRIG", "HDL", "CHOLHDL", "VLDL", "LDLCALC", "LDLDIRECT"   Risk Assessment/Calculations:             Physical Exam:    VS:  BP 112/78 (BP Location: Right Arm)   Pulse (!) 106   Ht 5\' 5"  (1.651 m)   Wt 190 lb 12.8 oz (86.5 kg)   SpO2 (!) 4%   BMI 31.75 kg/m     Wt Readings from Last 3 Encounters:  10/18/22 190 lb 12.8 oz (86.5 kg)  09/28/22 186 lb (84.4 kg)  06/22/22 183 lb 9.6 oz (83.3 kg)     GEN:  Well nourished, mild to moderate chest discomfort. HEENT: Normal NECK: No JVD; No carotid bruits CARDIAC: RRR, no murmurs, rubs, gallops RESPIRATORY:  Clear to auscultation without rales, wheezing or rhonchi  ABDOMEN: Soft, non-tender, non-distended MUSCULOSKELETAL:  No edema; No deformity  SKIN: Warm and dry NEUROLOGIC:  Alert and oriented x 3 PSYCHIATRIC:  Normal affect   ASSESSMENT:    1. Unstable angina pectoris ([place])   2. Coronary artery disease involving native coronary artery of native heart, unspecified whether angina present   3. Primary hypertension   4. Smoking    PLAN:    In order of problems listed above:  Chest pain, consistent with unstable angina, complaining of chest pain during my exam.  Recent calcium score 601.  Due to risk factors and symptoms, will refer patient to ED for left heart cath consideration and possible PCI. CAD, LAD and RCA calcification, calcium score  601.  Lipitor 80, left heart cath as above, echo while admitted. Hypertension, BP controlled.  Continue current meds. Current smoker, smoking cessation  strongly advised.  Follow-up after cardiac workup.  Shared Decision Making/Informed Consent The risks [stroke (1 in 1000), death (1 in 1000), kidney failure [usually temporary] (1 in 500), bleeding (1 in 200), allergic reaction [possibly serious] (1 in 200)], benefits (diagnostic support and management of coronary artery disease) and alternatives of a cardiac catheterization were discussed in detail with Ms. Vicario and she is willing to proceed.       Medication Adjustments/Labs and Tests Ordered: Current medicines are reviewed at length with the patient today.  Concerns regarding medicines are outlined above.  Orders Placed This Encounter  Procedures   EKG 12-Lead   No orders of the defined types were placed in this encounter.   Patient Instructions  Patient is going to the ED   Medication Instructions:   Your physician recommends that you continue on your current medications as directed. Please refer to the Current Medication list given to you today.  *If you need a refill on your cardiac medications before your next appointment, please call your pharmacy*   Lab Work:  None Ordered  If you have labs (blood work) drawn today and your tests are completely normal, you will receive your results only by: [place] (if you have MyChart) OR A paper copy in the mail If you have any lab test that is abnormal or we need to change your treatment, we will call you to review the results.   Testing/Procedures:  None Ordered   Follow-Up: At [facility], you and your health needs are our priority.  As part of our continuing mission to provide you with exceptional heart care, we have created designated Provider Care Teams.  These Care Teams include your primary Cardiologist (physician) and Advanced Practice Providers (APPs -  Physician Assistants and Nurse Practitioners) who all work together to provide you with the care you need, when you need it.  We recommend signing  up for the patient portal called "MyChart".  Sign up information is provided on this After Visit Summary.  MyChart is used to connect with patients for Virtual Visits (Telemedicine).  Patients are able to view lab/test results, encounter notes, upcoming appointments, etc.  Non-urgent messages can be sent to your provider as well.   To learn more about what you can do with MyChart, go to [email or web address].      Signed, Kate Sable, MD  10/18/2022 12:47 PM    [place]

## 2022-10-18 NOTE — Patient Instructions (Signed)
Patient is going to the ED   Medication Instructions:   Your physician recommends that you continue on your current medications as directed. Please refer to the Current Medication list given to you today.  *If you need a refill on your cardiac medications before your next appointment, please call your pharmacy*   Lab Work:  None Ordered  If you have labs (blood work) drawn today and your tests are completely normal, you will receive your results only by: [place] (if you have MyChart) OR A paper copy in the mail If you have any lab test that is abnormal or we need to change your treatment, we will call you to review the results.   Testing/Procedures:  None Ordered   Follow-Up: At [facility], you and your health needs are our priority.  As part of our continuing mission to provide you with exceptional heart care, we have created designated Provider Care Teams.  These Care Teams include your primary Cardiologist (physician) and Advanced Practice Providers (APPs -  Physician Assistants and Nurse Practitioners) who all work together to provide you with the care you need, when you need it.  We recommend signing up for the patient portal called "MyChart".  Sign up information is provided on this After Visit Summary.  MyChart is used to connect with patients for Virtual Visits (Telemedicine).  Patients are able to view lab/test results, encounter notes, upcoming appointments, etc.  Non-urgent messages can be sent to your provider as well.   To learn more about what you can do with MyChart, go to [email or web address].

## 2022-10-18 NOTE — Assessment & Plan Note (Signed)
Patient presenting with 3-week history of chest pain occurring at night and rest.  Seen by primary cardiologist today with referral to the ED for consideration of catheterization.  She is having active chest pain at this time, however no EKG changes noted.  Initial troponin negative.  - Cardiology consulted; appreciate their recommendations - Timing of catheterization per cardiology - Heparin infusion per pharmacy dosing - Aspirin 325 mg once - Start aspirin 81 mg tomorrow - Nitroglycerin as needed for chest pain - Lipid panel pending - Continue home high intensity statin - Echocardiogram ordered

## 2022-10-18 NOTE — ED Notes (Addendum)
First Nurse Note: Patient to ED from cardiology office for chest pain. Per RN, MD spoke with Sophronia Simas, MD and he is to see patient in ED. Denies hx has known blockages.

## 2022-10-18 NOTE — Progress Notes (Signed)
Lab called with critical lab result- Troponin lab went from 14 to 41- EPIC chatted covering Provider with information

## 2022-10-18 NOTE — Assessment & Plan Note (Signed)
On examination, patient appears lightly hypovolemic.  - IV fluids ordered - Avoid nephrotoxic agents as able - Repeat BMP in the a.m.

## 2022-10-18 NOTE — ED Provider Notes (Signed)
[facility] Provider Note   Event Date/Time   First MD Initiated Contact with Patient 10/18/22 1234     (approximate) History  Chest Pain  HPI Tammy Lamb is a 72 y.o. female with a stated past medical history of hypertension and hyperlipidemia who presents at the behest of her cardiologist after a cardiac CT showed an elevated calcium score and wanted patient to be inpatient for a coronary catheterization "sooner rather than later".  Patient states that she has had multi month symptoms of dyspnea on exertion and intermittent chest tightness with shortness of breath that occasionally wakes her up from sleep.  Patient states that the spells last approximately 20-30 minutes and have resolved in the past with nitroglycerin. ROS: Patient currently denies any vision changes, tinnitus, difficulty speaking, facial droop, sore throat, abdominal pain, nausea/vomiting/diarrhea, dysuria, or weakness/numbness/paresthesias in any extremity   Physical Exam  Triage Vital Signs: ED Triage Vitals  Enc Vitals Group     BP 10/18/22 1238 130/68     Pulse Rate 10/18/22 1238 82     Resp 10/18/22 1238 20     Temp 10/18/22 1238 100.1 F (37.8 C)     Temp Source 10/18/22 1238 Oral     SpO2 10/18/22 1238 97 %     Weight --      Height --      Head Circumference --      Peak Flow --      Pain Score 10/18/22 1231 7     Pain Loc --      Pain Edu? --      Excl. in [place]? --    Most recent vital signs: Vitals:   10/19/22 0610 10/19/22 0721  BP: (!) 159/81 (!) 143/80  Pulse: 70 67  Resp: 18 18  Temp: 99.4 F (37.4 C) 99.7 F (37.6 C)  SpO2: 91% 92%   General: Awake, oriented x4. CV:  Good peripheral perfusion.  Resp:  Normal effort.  Abd:  No distention.  Other:  Elderly obese Caucasian female laying in bed in no acute distress ED Results / Procedures / Treatments  Labs (all labs ordered are listed, but only abnormal results are displayed) Labs Reviewed  BASIC METABOLIC  PANEL - Abnormal; Notable for the following components:      Result Value   Sodium 132 (*)    Chloride 96 (*)    Creatinine, Ser 1.08 (*)    GFR, Estimated 55 (*)    All other components within normal limits  BRAIN NATRIURETIC PEPTIDE - Abnormal; Notable for the following components:   B Natriuretic Peptide 114.6 (*)    All other components within normal limits  BASIC METABOLIC PANEL - Abnormal; Notable for the following components:   Sodium 134 (*)    Calcium 8.8 (*)    All other components within normal limits  CBC - Abnormal; Notable for the following components:   Hemoglobin 11.7 (*)    HCT 35.5 (*)    All other components within normal limits  TROPONIN I (HIGH SENSITIVITY) - Abnormal; Notable for the following components:   Troponin I (High Sensitivity) 41 (*)    All other components within normal limits  TROPONIN I (HIGH SENSITIVITY) - Abnormal; Notable for the following components:   Troponin I (High Sensitivity) 44 (*)    All other components within normal limits  SARS CORONAVIRUS 2 BY RT PCR  RESPIRATORY PANEL BY PCR  CBC  TSH  LIPID PANEL  PROCALCITONIN  LIPOPROTEIN A (LPA)  HIV ANTIBODY (ROUTINE TESTING W REFLEX)  POCT ACTIVATED CLOTTING TIME  TROPONIN I (HIGH SENSITIVITY)   EKG ED ECG REPORT I, Naaman Plummer, the attending physician, personally viewed and interpreted this ECG. Date: 10/18/2022 EKG Time: 1245 Rate: 82 Rhythm: normal sinus rhythm QRS Axis: normal Intervals: normal ST/T Wave abnormalities: normal Narrative Interpretation: no evidence of acute ischemia RADIOLOGY ED MD interpretation: 2 view chest x-ray interpreted by me shows no evidence of acute abnormalities including no pneumonia, pneumothorax, or widened mediastinum -Agree with radiology assessment Official radiology report(s): CARDIAC CATHETERIZATION  Result Date: 10/18/2022   Prox RCA lesion is 85% stenosed.   A drug-eluting stent was successfully placed using a STENT ONYX FRONTIER  3.5X15.   Post intervention, there is a 0% residual stenosis.   The left ventricular systolic function is normal.   LV end diastolic pressure is mildly elevated.   The left ventricular ejection fraction is 55-65% by visual estimate. 1.  Severe one-vessel coronary artery disease with 85% hazy eccentric stenosis in the proximal right coronary artery which is the likely culprit for unstable angina.  The LAD is mildly calcified but no significant stenosis. 2.  Normal LV systolic function and mildly elevated left ventricular end-diastolic pressure. 3.  Successful drug-eluting stent placement to the proximal right coronary artery. Recommendations: Dual antiplatelet therapy for at least 12 months. Aggressive treatment of risk factors. Likely discharge home tomorrow if she remains stable.   DG Chest 2 View  Result Date: 10/18/2022 CLINICAL DATA:  Chest pain EXAM: CHEST - 2 VIEW COMPARISON:  Previous studies including CT done on 10/05/2022 FINDINGS: Cardiac size is within normal limits. Increase in AP diameter of chest suggests COPD. Lung fields are clear of any infiltrates or pulmonary edema. There is no pleural effusion or pneumothorax. There is large fixed hiatal hernia. IMPRESSION: COPD.  Lung fields are clear of any infiltrates or pulmonary edema. Large fixed hiatal hernia. Electronically Signed   By: Elmer Picker M.D.   On: 10/18/2022 13:11   PROCEDURES: Critical Care performed: No .1-3 Lead EKG Interpretation  Performed by: Naaman Plummer, MD Authorized by: Naaman Plummer, MD     Interpretation: normal     ECG rate:  71   ECG rate assessment: normal     Rhythm: sinus rhythm     Ectopy: none     Conduction: normal    MEDICATIONS ORDERED IN ED: Medications  sodium chloride flush (NS) 0.9 % injection 3 mL (3 mLs Intravenous Given 10/18/22 2142)  aspirin chewable tablet 324 mg ( Oral MAR Unhold 10/18/22 1905)    Or  aspirin suppository 300 mg ( Rectal MAR Unhold 10/18/22 1905)  aspirin EC  tablet 81 mg ( Oral MAR Unhold 10/18/22 1905)  nitroGLYCERIN (NITROSTAT) SL tablet 0.4 mg ( Sublingual MAR Unhold 10/18/22 1905)  acetaminophen (TYLENOL) tablet 650 mg (650 mg Oral Given 10/18/22 2141)  ondansetron (ZOFRAN) injection 4 mg ( Intravenous MAR Unhold 10/18/22 1905)  predniSONE (DELTASONE) tablet 40 mg ( Oral MAR Unhold 10/18/22 1905)  ipratropium-albuterol (DUONEB) 0.5-2.5 (3) MG/3ML nebulizer solution 3 mL (3 mLs Nebulization Given 10/19/22 0223)  benzonatate (TESSALON) capsule 200 mg (200 mg Oral Given 10/18/22 2141)  carbidopa-levodopa (SINEMET IR) 25-100 MG per tablet immediate release 1 tablet (1 tablet Oral Given 10/18/22 2141)  enoxaparin (LOVENOX) injection 40 mg (has no administration in time range)  0.9% sodium chloride infusion (0 mL/kg/hr  86.5 kg Intravenous Stopped 10/19/22 0156)  sodium chloride flush (NS)  0.9 % injection 3 mL (3 mLs Intravenous Not Given 10/19/22 0155)  sodium chloride flush (NS) 0.9 % injection 3 mL (has no administration in time range)  0.9 %  sodium chloride infusion (has no administration in time range)  atorvastatin (LIPITOR) tablet 80 mg (80 mg Oral Given 10/18/22 2140)  clopidogrel (PLAVIX) tablet 75 mg (has no administration in time range)  metoprolol tartrate (LOPRESSOR) tablet 25 mg (25 mg Oral Given 10/18/22 2141)  buPROPion (WELLBUTRIN SR) 12 hr tablet 150 mg (150 mg Oral Given 10/18/22 2142)  citalopram (CELEXA) tablet 20 mg (has no administration in time range)  DULoxetine (CYMBALTA) DR capsule 60 mg (has no administration in time range)  gabapentin (NEURONTIN) capsule 900 mg (900 mg Oral Given 10/18/22 2141)  HYDROcodone-acetaminophen (NORCO) 7.5-325 MG per tablet 1 tablet (has no administration in time range)  hydrOXYzine (ATARAX) tablet 25 mg (25 mg Oral Given 10/18/22 2141)  pentosan polysulfate (ELMIRON) capsule 100 mg (has no administration in time range)  sulfaSALAzine (AZULFIDINE) EC tablet 500 mg (has no administration in time range)   tiZANidine (ZANAFLEX) tablet 4 mg (has no administration in time range)  amLODipine (NORVASC) tablet 10 mg (has no administration in time range)  mometasone-formoterol (DULERA) 200-5 MCG/ACT inhaler 2 puff (has no administration in time range)   IMPRESSION / MDM / ASSESSMENT AND PLAN / ED COURSE  I reviewed the triage vital signs and the nursing notes.                             The patient is on the cardiac monitor to evaluate for evidence of arrhythmia and/or significant heart rate changes. Patient's presentation is most consistent with acute presentation with potential threat to life or bodily function. Workup: ECG, CXR, CBC, BMP, Troponin Findings: ECG: No overt evidence of STEMI. No evidence of Brugadas sign, delta wave, epsilon wave, significantly prolonged QTc, or malignant arrhythmia HS Troponin: Negative x1 Other Labs unremarkable for emergent problems. CXR: Without PTX, PNA, or widened mediastinum Last Stress Test: Never Last Heart Catheterization: Never HEART Score: 5  Given History, Exam, and Workup I have low suspicion for ACS, Pneumothorax, Pneumonia, Pulmonary Embolus, Tamponade, Aortic Dissection or other emergent problem as a cause for this presentation.   High Risk Chest Pain Patient at increased risk for Major Adverse Cardiac Event (AMI, PCI, CABG, death) Interventions: ASA 324mg  Defer Heparin drip as patient pain free at this time,   Disposition: Admit for continued cardiac monitoring and trending of troponins as well as further evaluation for potential inpatient stress testing vs cardiac catheterization and coronary angiography.    FINAL CLINICAL IMPRESSION(S) / ED DIAGNOSES   Final diagnoses:  Chest pain, unspecified type   Rx / DC Orders   ED Discharge Orders          Ordered    AMB Referral to Cardiac Rehabilitation - Phase II        10/18/22 1535           Note:  This document was prepared using Dragon voice recognition software and may  include unintentional dictation errors.   Naaman Plummer, MD 10/19/22 [phone]

## 2022-10-18 NOTE — H&P (Signed)
History and Physical    Patient: Tammy Lamb [id number] DOB: 12/29/1950 DOA: 10/18/2022 DOS: the patient was seen and examined on 10/18/2022 PCP: Gladstone Lighter, MD  Patient coming from: Home  Chief Complaint:  Chief Complaint  Patient presents with   Chest Pain   HPI: Tammy Lamb is a 72 y.o. female with medical history significant of CAD, hyperlipidemia, COPD, hypertension, hyperlipidemia, chronic pain, ulcerative colitis, who presents to the ED with complaints of chest pain.  [name] states that for the last 3 weeks, she has been experiencing daily chest pain that most frequently wakes her up in the middle the night due to the severity.  It does occasionally occur during the day and typically only occurs when she is resting.  She has not experienced any shortness of breath or chest pain with exertion.  She denies any orthopnea or lower extremity swelling.    She notes that for the last 2 days, she has developed a productive cough that her husband also has.  She endorses occasional shortness of breath but that states this has occurred in the past.  She denies shortness of breath at this time.  Per chart review, she went to be evaluated by her cardiologist today, who sent her emergently to the ED for catheterization due to concern for unstable angina.  ED course: On arrival to the ED, patient was normotensive at 130/68 with heart rate of 80.  She was saturating at 97% on room air.  She had a low-grade temperature of 100.1 initial workup notable for normal WBC of 8.6, hemoglobin of 12.2, and platelets of 291.  BMP and troponin are still pending.  COVID-19 PCR still pending.  Cardiology consulted and requesting hospitalist admission.  [facility] contacted for admission.  Review of Systems: As mentioned in the history of present illness. All other systems reviewed and are negative.  Past Medical History:  Diagnosis Date   Anemia    h/o   Anxiety    Arthritis    Bilateral carotid  artery stenosis    Chronic lower back pain    Colitis, ulcerative (HCC)    COPD (chronic obstructive pulmonary disease) ([place])    Depression    Diabetes mellitus without complication (HCC)    GERD (gastroesophageal reflux disease)    H/O bladder infections    History of 2019 novel coronavirus disease (COVID-19) 02/2021   History of hiatal hernia    HLD (hyperlipidemia)    Hypertension    Interstitial cystitis    Low sodium levels    Motion sickness    fair rides   Parkinson's disease    suspected per dr Manuella Ghazi @ [facility] neuro   PVD (peripheral vascular disease) ([place])    Sinus headache    Stroke ([place]) 2015   mild stroke d/t coreg dose being too high and pt. had severe hypotension.   TIA (transient ischemic attack) 2015   no deficits.  per pt due to low BP.   Urinary frequency    Wears dentures    partial upper and lower   Past Surgical History:  Procedure Laterality Date   ABDOMINAL HYSTERECTOMY     BSO   ANKLE ARTHROSCOPY Left 03/22/2016   Procedure: ANKLE ARTHROSCOPY DEBRODEMENT EXTENSIVE LEFT FLEXAR TENDON REPAIR;  Surgeon: [name], DPM;  Location: [place];  Service: Podiatry;  Laterality: Left;  WITH POPLITEAL   ANTERIOR CERVICAL DECOMP/DISCECTOMY FUSION N/A 04/09/2019   Procedure: ANTERIOR CERVICAL DECOMPRESSION/DISCECTOMY FUSION 1 LEVEL C3-4;  Surgeon: Izora Ribas,  Ardyth Gal, MD;  Location: [facility];  Service: Neurosurgery;  Laterality: N/A;   BLADDER SUSPENSION     tac   CHOLECYSTECTOMY     COLONOSCOPY     DIAGNOSTIC LAPAROSCOPY     adhesions   KNEE ARTHROSCOPY Left 02/13/2014   Procedure: LEFT ARTHROSCOPY KNEE, PARTIAL MEDIAL MENISECTOMY AND PLICA, CHONDROPLASTY OF PATELLA FEMORAL JOINT;  Surgeon: Alta Corning, MD;  Location: [place];  Service: Orthopedics;  Laterality: Left;   LUMBAR LAMINECTOMY/DECOMPRESSION MICRODISCECTOMY N/A 05/30/2017   Procedure: LUMBAR LAMINECTOMY/DECOMPRESSION MICRODISCECTOMY 3 LEVELS-L3-S1;  Surgeon: Meade Maw, MD;  Location: [facility];  Service: Neurosurgery;  Laterality: N/A;   LUMBAR LAMINECTOMY/DECOMPRESSION MICRODISCECTOMY N/A 05/27/2021   Procedure: L4-S1 DECOMPRESSION;  Surgeon: Meade Maw, MD;  Location: [facility];  Service: Neurosurgery;  Laterality: N/A;   TENDON REPAIR Left 03/22/2016   Procedure: [place];  Surgeon: [name], DPM;  Location: [place];  Service: Podiatry;  Laterality: Left;   TONSILLECTOMY     UPPER GI ENDOSCOPY     Social History:  reports that she has been smoking cigarettes. She has a 24.50 pack-year smoking history. She has never used smokeless tobacco. She reports that she does not currently use alcohol. She reports that she does not use drugs.  Allergies  Allergen Reactions   Aspirin Other (See Comments)    GERD   Sulfa Antibiotics Nausea And Vomiting    Family History  Problem Relation Age of Onset   Diabetes Mother    Hypertension Mother    Hypertension Father    COPD Father    Heart disease Father    Breast cancer Neg Hx     Prior to Admission medications   Medication Sig Start Date End Date Taking? Authorizing Provider  acetaminophen (TYLENOL) 500 MG tablet Take 1,000 mg by mouth every 6 (six) hours as needed for moderate pain or headache.    [provider]  amitriptyline (ELAVIL) 25 MG tablet Take 25 mg by mouth at bedtime. 07/28/22   [provider]  amLODipine (NORVASC) 5 MG tablet Take 10 mg by mouth every morning. 03/03/19   [provider]  ascorbic acid (VITAMIN C) 1000 MG tablet Take 1,000 mg by mouth at bedtime.    [provider]  atorvastatin (LIPITOR) 80 MG tablet Take 80 mg by mouth at bedtime.    [provider]  buPROPion (WELLBUTRIN SR) 150 MG 12 hr tablet Take 150 mg by mouth 2 (two) times daily. 08/25/22   [provider]  carbidopa-levodopa (SINEMET IR) 25-100 MG tablet Take 1 tablet by mouth 3 (three) times daily. 09/26/22 09/26/23  [provider]  cephALEXin (KEFLEX) 250 MG capsule Take 250 mg by mouth every morning. 10/03/19   [provider]  cholecalciferol (VITAMIN D) 25 MCG (1000 UNIT) tablet Take 1,000 Units by mouth at bedtime.    [provider]  Cinnamon 500 MG TABS Take 500 mg by mouth in the morning and at bedtime.    [provider]  citalopram (CELEXA) 20 MG tablet Take 20 mg by mouth every morning.    [provider]  cloNIDine (CATAPRES) 0.1 MG tablet Take 0.1 mg by mouth daily as needed. Only take PM if HTN over 165    [provider]  DULoxetine (CYMBALTA) 60 MG capsule Take by mouth. 12/15/21 12/15/22  [provider]  fluticasone-salmeterol (ADVAIR) 250-50 MCG/ACT AEPB Inhale 1 puff into the lungs in the morning and at bedtime.  [provider]  gabapentin (NEURONTIN) 300 MG capsule Take 3 capsules (900 mg total) by mouth 2 (two) times daily. 09/28/22 12/27/22  Gillis Santa, MD  hydrochlorothiazide (HYDRODIURIL) 25 MG tablet Take 25 mg by mouth every morning.    [provider]  HYDROcodone-acetaminophen (NORCO) 7.5-325 MG tablet Take 1 tablet by mouth every 8 (eight) hours as needed for severe pain. Must last 30 days. 10/06/22 11/05/22  Gillis Santa, MD  HYDROcodone-acetaminophen (NORCO) 7.5-325 MG tablet Take 1 tablet by mouth every 8 (eight) hours as needed for severe pain. Must last 30 days. Patient not taking: Reported on 10/18/2022 11/05/22 12/05/22  Gillis Santa, MD  HYDROcodone-acetaminophen (NORCO) 7.5-325 MG tablet Take 1 tablet by mouth every 8 (eight) hours as needed for severe pain. Must last 30 days. Patient not taking: Reported on 10/18/2022 12/05/22 01/04/23  Gillis Santa, MD  hydrOXYzine (ATARAX/VISTARIL) 25 MG tablet Take 25 mg by mouth at bedtime.    [provider]  magnesium oxide (MAG-OX) 400 MG tablet Take 400 mg by mouth at bedtime.    [provider]  Melatonin 10 MG TABS Take 10 mg by mouth at bedtime.  12/26/18   [provider]  Omega-3 Fatty Acids (FISH OIL) 1200 MG CAPS Take by mouth.    [provider]  Omeprazole-Sodium Bicarbonate (ZEGERID) 20-1100 MG CAPS capsule Take 1 capsule by mouth in the morning and at bedtime.    [provider]  oxybutynin (DITROPAN-XL) 10 MG 24 hr tablet Take 10 mg by mouth at bedtime. 12/13/20   [provider]  pentosan polysulfate (ELMIRON) 100 MG capsule Take 100 mg by mouth 3 (three) times daily.    [provider]  predniSONE (DELTASONE) 20 MG tablet Take 20 mg by mouth daily with breakfast. 09/01/22   [provider]  propranolol (INDERAL) 20 MG tablet Take 20 mg by mouth 2 (two) times daily. 08/30/22 08/30/23  [provider]  ramipril (ALTACE) 10 MG capsule Take 1 capsule by mouth daily.    [provider]  sulfaSALAzine (AZULFIDINE) 500 MG tablet Take 500 mg by mouth 2 (two) times daily.    [provider]  tiZANidine (ZANAFLEX) 4 MG tablet Take 1 tablet (4 mg total) by mouth every 8 (eight) hours as needed. 03/28/22 10/18/22  Gillis Santa, MD  valsartan (DIOVAN) 80 MG tablet Take 80 mg by mouth at bedtime.    [provider]  VENTOLIN HFA 108 (90 Base) MCG/ACT inhaler Inhale 2 puffs into the lungs every 6 (six) hours as needed for wheezing or shortness of breath.  11/07/18   [provider]  vitamin B-12 (CYANOCOBALAMIN) 1000 MCG tablet Take 1,000 mcg by mouth daily.    [provider]    Physical Exam: Vitals:   10/18/22 1238 10/18/22 1400  BP: 130/68   Pulse: 82   Resp: 20   Temp: 100.1 F (37.8 C)   TempSrc: Oral   SpO2: 97%   Weight:  86.5 kg  Height:  5\' 5"  (1.651 m)   Physical Exam Vitals and nursing note reviewed.  Constitutional:      General: She is not in acute distress.    Appearance: She is normal weight. She is not toxic-appearing.  HENT:     Head: Normocephalic and atraumatic.  Cardiovascular:     Rate and Rhythm: Normal rate  and regular rhythm.     Pulses:          Dorsalis pedis pulses are 2+ on  the right side and 2+ on the left side.     Heart sounds: Murmur heard.     Systolic (2/6 holosystolic heart murmur heard best at the left lower sternal border and apex) murmur is present.  Pulmonary:     Effort: Pulmonary effort is normal. No tachypnea.     Breath sounds: Wheezing (Diffuse wheezing heard throughout) present. No decreased breath sounds, rhonchi or rales.  Chest:     Chest wall: No tenderness.  Abdominal:     General: Bowel sounds are normal.     Palpations: Abdomen is soft.     Tenderness: There is no abdominal tenderness.  Musculoskeletal:     Right lower leg: No edema.     Left lower leg: No edema.  Skin:    General: Skin is warm and dry.  Neurological:     General: No focal deficit present.     Mental Status: She is alert and oriented to person, place, and time.  Psychiatric:        Mood and Affect: Mood normal.        Behavior: Behavior normal.    Data Reviewed: CBC with WBC of 8.6, hemoglobin of 12.2, MCV of 87 and platelets of 291 BMP with sodium of 132, potassium 3.7, bicarb 27, creatinine 1.05, with GFR 55. Troponin negative at 14  EKG personally reviewed.Sinus rhythm with rate of 82. No ST or T wave changes concerning for acute ischemia.  DG Chest 2 View  Result Date: 10/18/2022 CLINICAL DATA:  Chest pain EXAM: CHEST - 2 VIEW COMPARISON:  Previous studies including CT done on 10/05/2022 FINDINGS: Cardiac size is within normal limits. Increase in AP diameter of chest suggests COPD. Lung fields are clear of any infiltrates or pulmonary edema. There is no pleural effusion or pneumothorax. There is large fixed hiatal hernia. IMPRESSION: COPD.  Lung fields are clear of any infiltrates or pulmonary edema. Large fixed hiatal hernia. Electronically Signed   By: Elmer Picker M.D.   On: 10/18/2022 13:11    Results are pending, will review when available.  Assessment and Plan:  *  Unstable angina [facility]) Patient presenting with 3-week history of chest pain occurring at night and rest.  Seen by primary cardiologist today with referral to the ED for consideration of catheterization.  She is having active chest pain at this time, however no EKG changes noted.  Initial troponin negative.  - Cardiology consulted; appreciate their recommendations - Timing of catheterization per cardiology - Heparin infusion per pharmacy dosing - Aspirin 325 mg once - Start aspirin 81 mg tomorrow - Nitroglycerin as needed for chest pain - Lipid panel pending - Continue home high intensity statin - Echocardiogram ordered  COPD with acute exacerbation ([place]) Patient notes increased cough for the last several days and occasional shortness of breath, with diffuse wheezing heard on examination.  Husband at bedside also has a cough.  COVID-19 PCR negative.  - Start prednisone 40 mg to complete a 5-day course - RVP and procalcitonin pending - DuoNebs every 6 hours - Continue home bronchodilators  AKI (acute kidney injury) ([place]) On examination, patient appears lightly hypovolemic.  - IV fluids ordered - Avoid nephrotoxic agents as able - Repeat BMP in the a.m.  Benign essential HTN -Hold home antihypertensives in the setting of AKI  Ulcerative colitis ([place]) - Hold home prednisone is currently receiving higher dose for COPD exacerbation - Continue home sulfasalazine  Chronic pain syndrome - Continue home hydrocodone and gabapentin  Primary parkinsonism -  Continue home Sinemet  Advance Care Planning:   Code Status: Full Code verified by patient.  She states she would not want long-term life support, but is amenable to a short-term trial.  Consults: Cardiology  Family Communication: Patient's husband updated at bedside  Severity of Illness: The appropriate patient status for this patient is OBSERVATION. Observation status is judged to be reasonable and necessary in order to provide  the required intensity of service to ensure the patient's safety. The patient's presenting symptoms, physical exam findings, and initial radiographic and laboratory data in the context of their medical condition is felt to place them at decreased risk for further clinical deterioration. Furthermore, it is anticipated that the patient will be medically stable for discharge from the hospital within 2 midnights of admission.   Author: Jose Persia, MD 10/18/2022 2:29 PM  For on call review [email or web address].

## 2022-10-18 NOTE — Assessment & Plan Note (Signed)
-   Hold home prednisone is currently receiving higher dose for COPD exacerbation - Continue home sulfasalazine

## 2022-10-18 NOTE — Assessment & Plan Note (Signed)
-  Hold home antihypertensives in the setting of AKI

## 2022-10-18 NOTE — Assessment & Plan Note (Signed)
Continue home Sinemet °

## 2022-10-18 NOTE — Interval H&P Note (Signed)
History and Physical Interval Note:  10/18/2022 2:38 PM  Tammy Lamb  has presented today for surgery, with the diagnosis of unstable angina.  The various methods of treatment have been discussed with the patient and family. After consideration of risks, benefits and other options for treatment, the patient has consented to  Procedure(s): LEFT HEART CATH AND CORONARY ANGIOGRAPHY (N/A) as a surgical intervention.  The patient's history has been reviewed, patient examined, no change in status, stable for surgery.  I have reviewed the patient's chart and labs.  Questions were answered to the patient's satisfaction.     Kathlyn Sacramento

## 2022-10-18 NOTE — Progress Notes (Signed)
Cardiology Office Note:    Date:  10/18/2022   ID:  Tammy Lamb, DOB [birth year], MRN [id number]  PCP:  Gladstone Lighter, [place] [facility] Cardiologist:  Kate Sable, MD     Referring MD: Gladstone Lighter, MD   Chief Complaint  Patient presents with   New Patient (Initial Visit)    CAD, Calcified Coronary, chest pain, SOB, Arm and Leg pain, dizziness     History of Present Illness:    Tammy Lamb is a 72 y.o. female with a hx of hypertension, CAD (LAD and RCA calcifications on chest CT), hyperlipidemia, current smoker x 40+ years, COPD who presented due to CAD and chest pain.  She states having persistent chest pain ongoing over the past 3 weeks.  Symptoms alcohol almost daily, not associated with exertion, usually wakes her up from sleep.  Father had a heart attack in his 94s.  She describes chest pain and dizziness today, complaining of chest pressure radiating to left shoulder during clinic visit today.  She still smokes, is working on quitting.  States aspirin causes gastric irritation.  calcium score/scan obtained 10/05/2022 showing elevated calcium score of 601 (LAD 383, RCA 217).   Past Medical History:  Diagnosis Date   Anemia    h/o   Anxiety    Arthritis    Bilateral carotid artery stenosis    Chronic lower back pain    Colitis, ulcerative (HCC)    COPD (chronic obstructive pulmonary disease) ([place])    Depression    Diabetes mellitus without complication (HCC)    GERD (gastroesophageal reflux disease)    H/O bladder infections    History of 2019 novel coronavirus disease (COVID-19) 02/2021   History of hiatal hernia    HLD (hyperlipidemia)    Hypertension    Interstitial cystitis    Low sodium levels    Motion sickness    fair rides   Parkinson's disease    suspected per dr Manuella Ghazi @ [facility] [place] [facility]   PVD (peripheral vascular disease) ([place])    Sinus headache    Stroke ([place]) 2015   mild stroke d/t coreg dose being too high and pt.  had severe hypotension.   TIA (transient ischemic attack) 2015   no deficits.  per pt due to low BP.   Urinary frequency    Wears dentures    partial upper and lower    Past Surgical History:  Procedure Laterality Date   ABDOMINAL HYSTERECTOMY     BSO   ANKLE ARTHROSCOPY Left 03/22/2016   Procedure: ANKLE ARTHROSCOPY DEBRODEMENT EXTENSIVE LEFT FLEXAR TENDON REPAIR;  Surgeon: [name], DPM;  Location: [facility];  Service: Podiatry;  Laterality: Left;  WITH POPLITEAL   ANTERIOR CERVICAL DECOMP/DISCECTOMY FUSION N/A 04/09/2019   Procedure: ANTERIOR CERVICAL DECOMPRESSION/DISCECTOMY FUSION 1 LEVEL C3-4;  Surgeon: Meade Maw, MD;  Location: [facility];  Service: Neurosurgery;  Laterality: N/A;   BLADDER SUSPENSION     tac   CHOLECYSTECTOMY     COLONOSCOPY     DIAGNOSTIC LAPAROSCOPY     adhesions   KNEE ARTHROSCOPY Left 02/13/2014   Procedure: LEFT ARTHROSCOPY KNEE, PARTIAL MEDIAL MENISECTOMY AND PLICA, CHONDROPLASTY OF PATELLA FEMORAL JOINT;  Surgeon: Alta Corning, MD;  Location: [facility];  Service: Orthopedics;  Laterality: Left;   LUMBAR LAMINECTOMY/DECOMPRESSION MICRODISCECTOMY N/A 05/30/2017   Procedure: LUMBAR LAMINECTOMY/DECOMPRESSION MICRODISCECTOMY 3 LEVELS-L3-S1;  Surgeon: Meade Maw, MD;  Location: [facility];  Service: Neurosurgery;  Laterality: N/A;  LUMBAR LAMINECTOMY/DECOMPRESSION MICRODISCECTOMY N/A 05/27/2021   Procedure: L4-S1 DECOMPRESSION;  Surgeon: Meade Maw, MD;  Location: [facility];  Service: Neurosurgery;  Laterality: N/A;   TENDON REPAIR Left 03/22/2016   Procedure: [place];  Surgeon: [name], DPM;  Location: [facility];  Service: Podiatry;  Laterality: Left;   TONSILLECTOMY     UPPER GI ENDOSCOPY      Current Medications: Current Meds  Medication Sig   acetaminophen (TYLENOL) 500 MG tablet Take 1,000 mg by mouth every 6 (six) hours as needed for moderate pain or headache.    amitriptyline (ELAVIL) 25 MG tablet Take 25 mg by mouth at bedtime.   amLODipine (NORVASC) 5 MG tablet Take 10 mg by mouth every morning.   ascorbic acid (VITAMIN C) 1000 MG tablet Take 1,000 mg by mouth at bedtime.   atorvastatin (LIPITOR) 80 MG tablet Take 80 mg by mouth at bedtime.   buPROPion (WELLBUTRIN SR) 150 MG 12 hr tablet Take 150 mg by mouth 2 (two) times daily.   carbidopa-levodopa (SINEMET IR) 25-100 MG tablet Take 1 tablet by mouth 3 (three) times daily.   cephALEXin (KEFLEX) 250 MG capsule Take 250 mg by mouth every morning.   cholecalciferol (VITAMIN D) 25 MCG (1000 UNIT) tablet Take 1,000 Units by mouth at bedtime.   Cinnamon 500 MG TABS Take 500 mg by mouth in the morning and at bedtime.   citalopram (CELEXA) 20 MG tablet Take 20 mg by mouth every morning.   cloNIDine (CATAPRES) 0.1 MG tablet Take 0.1 mg by mouth daily as needed. Only take PM if HTN over 165   DULoxetine (CYMBALTA) 60 MG capsule Take by mouth.   fluticasone-salmeterol (ADVAIR) 250-50 MCG/ACT AEPB Inhale 1 puff into the lungs in the morning and at bedtime.   gabapentin (NEURONTIN) 300 MG capsule Take 3 capsules (900 mg total) by mouth 2 (two) times daily.   hydrochlorothiazide (HYDRODIURIL) 25 MG tablet Take 25 mg by mouth every morning.   HYDROcodone-acetaminophen (NORCO) 7.5-325 MG tablet Take 1 tablet by mouth every 8 (eight) hours as needed for severe pain. Must last 30 days.   hydrOXYzine (ATARAX/VISTARIL) 25 MG tablet Take 25 mg by mouth at bedtime.   magnesium oxide (MAG-OX) 400 MG tablet Take 400 mg by mouth at bedtime.   Melatonin 10 MG TABS Take 10 mg by mouth at bedtime.   Omega-3 Fatty Acids (FISH OIL) 1200 MG CAPS Take by mouth.   Omeprazole-Sodium Bicarbonate (ZEGERID) 20-1100 MG CAPS capsule Take 1 capsule by mouth in the morning and at bedtime.   oxybutynin (DITROPAN-XL) 10 MG 24 hr tablet Take 10 mg by mouth at bedtime.   pentosan polysulfate (ELMIRON) 100 MG capsule Take 100 mg by mouth 3  (three) times daily.   predniSONE (DELTASONE) 20 MG tablet Take 20 mg by mouth daily with breakfast.   propranolol (INDERAL) 20 MG tablet Take 20 mg by mouth 2 (two) times daily.   ramipril (ALTACE) 10 MG capsule Take 1 capsule by mouth daily.   sulfaSALAzine (AZULFIDINE) 500 MG tablet Take 500 mg by mouth 2 (two) times daily.   tiZANidine (ZANAFLEX) 4 MG tablet Take 1 tablet (4 mg total) by mouth every 8 (eight) hours as needed.   valsartan (DIOVAN) 80 MG tablet Take 80 mg by mouth at bedtime.   VENTOLIN HFA 108 (90 Base) MCG/ACT inhaler Inhale 2 puffs into the lungs every 6 (six) hours as needed for wheezing or shortness of breath.    vitamin B-12 (  CYANOCOBALAMIN) 1000 MCG tablet Take 1,000 mcg by mouth daily.     Allergies:   Aspirin and Sulfa antibiotics   Social History   Socioeconomic History   Marital status: Married    Spouse name: Not on file   Number of children: Not on file   Years of education: Not on file   Highest education level: Not on file  Occupational History   Not on file  Tobacco Use   Smoking status: Every Day    Packs/day: 0.50    Years: 49.00    Additional pack years: 0.00    Total pack years: 24.50    Types: Cigarettes   Smokeless tobacco: Never   Tobacco comments:    1 pack every 3-4 days 10-18-22  Vaping Use   Vaping Use: Never used  Substance and Sexual Activity   Alcohol use: Not Currently    Comment: Occassional,   Drug use: No   Sexual activity: Never  Other Topics Concern   Not on file  Social History Narrative   Not on file   Social Determinants of Health   Financial Resource Strain: Not on file  Food Insecurity: Not on file  Transportation Needs: Not on file  Physical Activity: Not on file  Stress: Not on file  Social Connections: Not on file     Family History: The patient's family history includes COPD in her father; Diabetes in her mother; Heart disease in her father; Hypertension in her father and mother. There is no history  of Breast cancer.  ROS:   Please see the history of present illness.     All other systems reviewed and are negative.  EKGs/Labs/Other Studies Reviewed:    The following studies were reviewed today:   EKG:  EKG is  ordered today.  The ekg ordered today demonstrates normal sinus rhythm, heart rate 83  Recent Labs: No results found for requested labs within last 365 days.  Recent Lipid Panel No results found for: "CHOL", "TRIG", "HDL", "CHOLHDL", "VLDL", "LDLCALC", "LDLDIRECT"   Risk Assessment/Calculations:             Physical Exam:    VS:  BP 112/78 (BP Location: Right Arm)   Pulse (!) 106   Ht 5\' 5"  (1.651 m)   Wt 190 lb 12.8 oz (86.5 kg)   SpO2 (!) 4%   BMI 31.75 kg/m     Wt Readings from Last 3 Encounters:  10/18/22 190 lb 12.8 oz (86.5 kg)  09/28/22 186 lb (84.4 kg)  06/22/22 183 lb 9.6 oz (83.3 kg)     GEN:  Well nourished, mild to moderate chest discomfort. HEENT: Normal NECK: No JVD; No carotid bruits CARDIAC: RRR, no murmurs, rubs, gallops RESPIRATORY:  Clear to auscultation without rales, wheezing or rhonchi  ABDOMEN: Soft, non-tender, non-distended MUSCULOSKELETAL:  No edema; No deformity  SKIN: Warm and dry NEUROLOGIC:  Alert and oriented x 3 PSYCHIATRIC:  Normal affect   ASSESSMENT:    1. Unstable angina pectoris ([place])   2. Coronary artery disease involving native coronary artery of native heart, unspecified whether angina present   3. Primary hypertension   4. Smoking    PLAN:    In order of problems listed above:  Chest pain, consistent with unstable angina, complaining of chest pain during my exam.  Recent calcium score 601.  Due to risk factors and symptoms, will refer patient to ED for left heart cath consideration and possible PCI. CAD, LAD and RCA calcification, calcium score  601.  Lipitor 80, left heart cath as above, echo while admitted. Hypertension, BP controlled.  Continue current meds. Current smoker, smoking cessation  strongly advised.  Follow-up after cardiac workup.  Shared Decision Making/Informed Consent The risks [stroke (1 in 1000), death (1 in 1000), kidney failure [usually temporary] (1 in 500), bleeding (1 in 200), allergic reaction [possibly serious] (1 in 200)], benefits (diagnostic support and management of coronary artery disease) and alternatives of a cardiac catheterization were discussed in detail with Ms. Osmanski and she is willing to proceed.       Medication Adjustments/Labs and Tests Ordered: Current medicines are reviewed at length with the patient today.  Concerns regarding medicines are outlined above.  Orders Placed This Encounter  Procedures   EKG 12-Lead   No orders of the defined types were placed in this encounter.   Patient Instructions  Patient is going to the ED   Medication Instructions:   Your physician recommends that you continue on your current medications as directed. Please refer to the Current Medication list given to you today.  *If you need a refill on your cardiac medications before your next appointment, please call your pharmacy*   Lab Work:  None Ordered  If you have labs (blood work) drawn today and your tests are completely normal, you will receive your results only by: [place] (if you have MyChart) OR A paper copy in the mail If you have any lab test that is abnormal or we need to change your treatment, we will call you to review the results.   Testing/Procedures:  None Ordered   Follow-Up: At [facility], you and your health needs are our priority.  As part of our continuing mission to provide you with exceptional heart care, we have created designated Provider Care Teams.  These Care Teams include your primary Cardiologist (physician) and Advanced Practice Providers (APPs -  Physician Assistants and Nurse Practitioners) who all work together to provide you with the care you need, when you need it.  We recommend signing  up for the patient portal called "MyChart".  Sign up information is provided on this After Visit Summary.  MyChart is used to connect with patients for Virtual Visits (Telemedicine).  Patients are able to view lab/test results, encounter notes, upcoming appointments, etc.  Non-urgent messages can be sent to your provider as well.   To learn more about what you can do with MyChart, go to [email or web address].      Signed, Kate Sable, MD  10/18/2022 12:47 PM    [place]

## 2022-10-18 NOTE — Assessment & Plan Note (Signed)
-   Continue home hydrocodone and gabapentin

## 2022-10-18 NOTE — Assessment & Plan Note (Signed)
Patient notes increased cough for the last several days and occasional shortness of breath, with diffuse wheezing heard on examination.  Husband at bedside also has a cough.  COVID-19 PCR negative.  - Start prednisone 40 mg to complete a 5-day course - RVP and procalcitonin pending - DuoNebs every 6 hours - Continue home bronchodilators

## 2022-10-19 ENCOUNTER — Observation Stay (HOSPITAL_COMMUNITY)
Admit: 2022-10-19 | Discharge: 2022-10-19 | Disposition: A | Discharge status: Home / Self Care | Payer: Medicare Other | Attending: Cardiovascular Disease | Admitting: Cardiovascular Disease

## 2022-10-19 DIAGNOSIS — E861 Hypovolemia: Secondary | ICD-10-CM | POA: Diagnosis present

## 2022-10-19 DIAGNOSIS — R079 Chest pain, unspecified: Secondary | ICD-10-CM

## 2022-10-19 DIAGNOSIS — Z8249 Family history of ischemic heart disease and other diseases of the circulatory system: Secondary | ICD-10-CM | POA: Diagnosis not present

## 2022-10-19 DIAGNOSIS — Z8616 Personal history of COVID-19: Secondary | ICD-10-CM | POA: Diagnosis not present

## 2022-10-19 DIAGNOSIS — I25110 Atherosclerotic heart disease of native coronary artery with unstable angina pectoris: Secondary | ICD-10-CM | POA: Diagnosis present

## 2022-10-19 DIAGNOSIS — I6523 Occlusion and stenosis of bilateral carotid arteries: Secondary | ICD-10-CM | POA: Diagnosis present

## 2022-10-19 DIAGNOSIS — Z8673 Personal history of transient ischemic attack (TIA), and cerebral infarction without residual deficits: Secondary | ICD-10-CM | POA: Diagnosis not present

## 2022-10-19 DIAGNOSIS — J101 Influenza due to other identified influenza virus with other respiratory manifestations: Secondary | ICD-10-CM | POA: Diagnosis present

## 2022-10-19 DIAGNOSIS — K5190 Ulcerative colitis, unspecified, without complications: Secondary | ICD-10-CM | POA: Diagnosis present

## 2022-10-19 DIAGNOSIS — E785 Hyperlipidemia, unspecified: Secondary | ICD-10-CM | POA: Diagnosis present

## 2022-10-19 DIAGNOSIS — Z833 Family history of diabetes mellitus: Secondary | ICD-10-CM | POA: Diagnosis not present

## 2022-10-19 DIAGNOSIS — E1151 Type 2 diabetes mellitus with diabetic peripheral angiopathy without gangrene: Secondary | ICD-10-CM | POA: Diagnosis present

## 2022-10-19 DIAGNOSIS — M545 Low back pain, unspecified: Secondary | ICD-10-CM | POA: Diagnosis present

## 2022-10-19 DIAGNOSIS — Z955 Presence of coronary angioplasty implant and graft: Secondary | ICD-10-CM

## 2022-10-19 DIAGNOSIS — K51919 Ulcerative colitis, unspecified with unspecified complications: Secondary | ICD-10-CM | POA: Diagnosis not present

## 2022-10-19 DIAGNOSIS — I5189 Other ill-defined heart diseases: Secondary | ICD-10-CM

## 2022-10-19 DIAGNOSIS — J441 Chronic obstructive pulmonary disease with (acute) exacerbation: Secondary | ICD-10-CM | POA: Diagnosis present

## 2022-10-19 DIAGNOSIS — M1990 Unspecified osteoarthritis, unspecified site: Secondary | ICD-10-CM | POA: Diagnosis present

## 2022-10-19 DIAGNOSIS — Z882 Allergy status to sulfonamides status: Secondary | ICD-10-CM | POA: Diagnosis not present

## 2022-10-19 DIAGNOSIS — Z825 Family history of asthma and other chronic lower respiratory diseases: Secondary | ICD-10-CM | POA: Diagnosis not present

## 2022-10-19 DIAGNOSIS — G20A1 Parkinson's disease without dyskinesia, without mention of fluctuations: Secondary | ICD-10-CM | POA: Diagnosis present

## 2022-10-19 DIAGNOSIS — I10 Essential (primary) hypertension: Secondary | ICD-10-CM | POA: Diagnosis present

## 2022-10-19 DIAGNOSIS — Z79899 Other long term (current) drug therapy: Secondary | ICD-10-CM | POA: Diagnosis not present

## 2022-10-19 DIAGNOSIS — K219 Gastro-esophageal reflux disease without esophagitis: Secondary | ICD-10-CM | POA: Diagnosis present

## 2022-10-19 DIAGNOSIS — B9729 Other coronavirus as the cause of diseases classified elsewhere: Secondary | ICD-10-CM | POA: Diagnosis present

## 2022-10-19 DIAGNOSIS — Z886 Allergy status to analgesic agent status: Secondary | ICD-10-CM | POA: Diagnosis not present

## 2022-10-19 DIAGNOSIS — I200 Unstable angina: Secondary | ICD-10-CM | POA: Diagnosis not present

## 2022-10-19 DIAGNOSIS — G894 Chronic pain syndrome: Secondary | ICD-10-CM | POA: Diagnosis present

## 2022-10-19 DIAGNOSIS — F17210 Nicotine dependence, cigarettes, uncomplicated: Secondary | ICD-10-CM | POA: Diagnosis present

## 2022-10-19 HISTORY — DX: Other ill-defined heart diseases: I51.89

## 2022-10-19 LAB — LIPID PANEL
Cholesterol: 158 mg/dL (ref 0–200)
HDL: 84 mg/dL (ref >40)
LDL Cholesterol: 63 mg/dL (ref 0–99)
Total CHOL/HDL Ratio: 1.9 RATIO
Triglycerides: 56 mg/dL (ref <150)
VLDL: 11 mg/dL (ref 0–40)

## 2022-10-19 LAB — ECHOCARDIOGRAM COMPLETE
AR max vel: 2.22 cm2
AV Area VTI: 2.28 cm2
AV Area mean vel: 2.15 cm2
AV Mean grad: 8 mmHg
AV Peak grad: 15.1 mmHg
Ao pk vel: 1.94 m/s
Area-P 1/2: 3.39 cm2
Height: 65 in
MV VTI: 2.62 cm2
S' Lateral: 2.9 cm
Weight: 3022.95 oz

## 2022-10-19 LAB — RESPIRATORY PANEL BY PCR
Adenovirus: NOT DETECTED
Bordetella Parapertussis: NOT DETECTED
Bordetella pertussis: NOT DETECTED
Chlamydophila pneumoniae: NOT DETECTED
Coronavirus 229E: NOT DETECTED
Coronavirus HKU1: DETECTED — AB
Coronavirus NL63: NOT DETECTED
Coronavirus OC43: NOT DETECTED
Influenza A H1 2009: DETECTED — AB
Influenza B: NOT DETECTED
Metapneumovirus: NOT DETECTED
Mycoplasma pneumoniae: NOT DETECTED
Parainfluenza Virus 1: NOT DETECTED
Parainfluenza Virus 2: NOT DETECTED
Parainfluenza Virus 3: NOT DETECTED
Parainfluenza Virus 4: NOT DETECTED
Respiratory Syncytial Virus: NOT DETECTED
Rhinovirus / Enterovirus: NOT DETECTED

## 2022-10-19 LAB — BASIC METABOLIC PANEL
Anion gap: 8 (ref 5–15)
BUN: 9 mg/dL (ref 8–23)
CO2: 27 mmol/L (ref 22–32)
Calcium: 8.8 mg/dL — ABNORMAL LOW (ref 8.9–10.3)
Chloride: 99 mmol/L (ref 98–111)
Creatinine, Ser: 0.89 mg/dL (ref 0.44–1.00)
GFR, Estimated: >60 mL/min (ref >60)
Glucose, Bld: 71 mg/dL (ref 70–99)
Potassium: 4.2 mmol/L (ref 3.5–5.1)
Sodium: 134 mmol/L — ABNORMAL LOW (ref 135–145)

## 2022-10-19 LAB — CBC
HCT: 35.5 % — ABNORMAL LOW (ref 36.0–46.0)
Hemoglobin: 11.7 g/dL — ABNORMAL LOW (ref 12.0–15.0)
MCH: 28.2 pg (ref 26.0–34.0)
MCHC: 33 g/dL (ref 30.0–36.0)
MCV: 85.5 fL (ref 80.0–100.0)
Platelets: 238 10*3/uL (ref 150–400)
RBC: 4.15 MIL/uL (ref 3.87–5.11)
RDW: 15 % (ref 11.5–15.5)
WBC: 6.5 10*3/uL (ref 4.0–10.5)
nRBC: 0 % (ref 0.0–0.2)

## 2022-10-19 LAB — HIV ANTIBODY (ROUTINE TESTING W REFLEX): HIV Screen 4th Generation wRfx: NONREACTIVE

## 2022-10-19 MED ORDER — IRBESARTAN 150 MG PO TABS
75.0000 mg | ORAL_TABLET | Freq: Every day | ORAL | Status: DC
  Administered 2022-10-20: 75 mg via ORAL
  Filled 2022-10-19: qty 1

## 2022-10-19 MED ORDER — IPRATROPIUM-ALBUTEROL 0.5-2.5 (3) MG/3ML IN SOLN
3.0000 mL | Freq: Two times a day (BID) | RESPIRATORY_TRACT | Status: DC
  Administered 2022-10-19 – 2022-10-20 (×2): 3 mL via RESPIRATORY_TRACT
  Filled 2022-10-19 (×2): qty 3

## 2022-10-19 MED ORDER — OXYBUTYNIN CHLORIDE ER 10 MG PO TB24
10.0000 mg | ORAL_TABLET | Freq: Every day | ORAL | Status: DC
  Administered 2022-10-19: 10 mg via ORAL
  Filled 2022-10-19: qty 1

## 2022-10-19 MED ORDER — PANTOPRAZOLE SODIUM 20 MG PO TBEC
20.0000 mg | DELAYED_RELEASE_TABLET | Freq: Every day | ORAL | Status: DC
  Administered 2022-10-19 – 2022-10-20 (×2): 20 mg via ORAL
  Filled 2022-10-19 (×2): qty 1

## 2022-10-19 NOTE — Plan of Care (Signed)
Patient is participating in goals of care to meet goals for discharge.  Celine Ahr, RN     Problem: Education: Goal: Understanding of CV disease, CV risk reduction, and recovery process will improve Outcome: Progressing Goal: Individualized Educational Video(s) Outcome: Progressing   Problem: Activity: Goal: Ability to return to baseline activity level will improve Outcome: Progressing   Problem: Cardiovascular: Goal: Ability to achieve and maintain adequate cardiovascular perfusion will improve Outcome: Progressing Goal: Vascular access site(s) Level 0-1 will be maintained Outcome: Progressing   Problem: Health Behavior/Discharge Planning: Goal: Ability to safely manage health-related needs after discharge will improve Outcome: Progressing   Problem: Education: Goal: Understanding of cardiac disease, CV risk reduction, and recovery process will improve Outcome: Progressing Goal: Individualized Educational Video(s) Outcome: Progressing   Problem: Activity: Goal: Ability to tolerate increased activity will improve Outcome: Progressing   Problem: Cardiac: Goal: Ability to achieve and maintain adequate cardiovascular perfusion will improve Outcome: Progressing   Problem: Health Behavior/Discharge Planning: Goal: Ability to safely manage health-related needs after discharge will improve Outcome: Progressing   Problem: Education: Goal: Knowledge of General Education information will improve Description: Including pain rating scale, medication(s)/side effects and non-pharmacologic comfort measures Outcome: Progressing   Problem: Health Behavior/Discharge Planning: Goal: Ability to manage health-related needs will improve Outcome: Progressing   Problem: Clinical Measurements: Goal: Ability to maintain clinical measurements within normal limits will improve Outcome: Progressing Goal: Will remain free from infection Outcome: Progressing Goal: Diagnostic test  results will improve Outcome: Progressing Goal: Respiratory complications will improve Outcome: Progressing Goal: Cardiovascular complication will be avoided Outcome: Progressing   Problem: Activity: Goal: Risk for activity intolerance will decrease Outcome: Progressing   Problem: Nutrition: Goal: Adequate nutrition will be maintained Outcome: Progressing   Problem: Coping: Goal: Level of anxiety will decrease Outcome: Progressing   Problem: Elimination: Goal: Will not experience complications related to bowel motility Outcome: Progressing Goal: Will not experience complications related to urinary retention Outcome: Progressing   Problem: Pain Managment: Goal: General experience of comfort will improve Outcome: Progressing   Problem: Safety: Goal: Ability to remain free from injury will improve Outcome: Progressing   Problem: Skin Integrity: Goal: Risk for impaired skin integrity will decrease Outcome: Progressing

## 2022-10-19 NOTE — TOC Progression Note (Signed)
Transition of Care [facility]) - Progression Note    Patient Details  Name: Tammy Lamb MRN: [id number] Date of Birth: 1950/11/19  [facility] CM/SW Contact  Laurena Slimmer, RN Phone Number: 10/19/2022, 11:14 AM  Clinical Narrative:    Case reviewed for DME needs and changes in discharge disposition.         Expected Discharge Plan and Services                                               Social Determinants of Health (SDOH) Interventions SDOH Screenings   Food Insecurity: No Food Insecurity (10/18/2022)  Housing: Low Risk  (10/18/2022)  Transportation Needs: No Transportation Needs (10/18/2022)  Utilities: Not At Risk (10/18/2022)  Depression (PHQ2-9): Low Risk  (06/20/2022)  Tobacco Use: High Risk (10/18/2022)    Readmission Risk Interventions     No data to display

## 2022-10-19 NOTE — Progress Notes (Signed)
PROGRESS NOTE    Tammy Lamb  [id number] DOB: 1951-06-21 DOA: 10/18/2022 PCP: Gladstone Lighter, MD  Outpatient Specialists: cardiology    Brief Narrative:   From admission h and p  Tammy Lamb is a 72 y.o. female with medical history significant of CAD, hyperlipidemia, COPD, hypertension, hyperlipidemia, chronic pain, ulcerative colitis, who presents to the ED with complaints of chest pain.   Tammy Lamb states that for the last 3 weeks, she has been experiencing daily chest pain that most frequently wakes her up in the middle the night due to the severity.  It does occasionally occur during the day and typically only occurs when she is resting.  She has not experienced any shortness of breath or chest pain with exertion.  She denies any orthopnea or lower extremity swelling.     She notes that for the last 2 days, she has developed a productive cough that her husband also has.  She endorses occasional shortness of breath but that states this has occurred in the past.  She denies shortness of breath at this time.   Per chart review, she went to be evaluated by her cardiologist today, who sent her emergently to the ED for catheterization due to concern for unstable angina.  Assessment & Plan:   Principal Problem:   Unstable angina (HCC) Active Problems:   COPD with acute exacerbation (HCC)   AKI (acute kidney injury) ([place])   Benign essential HTN   Ulcerative colitis ([place])   Chronic pain syndrome   Primary parkinsonism  Unstable angina ([place]) CAD Patient presenting with 3-week history of chest pain occurring at night and rest.  Seen by primary cardiologist with referral to the ED for consideration of catheterization.  Mild troponin elevation. Cardiac cath on 3/27 showed 85% RCA stenosis, likely the culprit, one DES was placed. Reports some chest pressure today but no pain. TTE no sig abnormalities - will need cardiac rehab at d/c - continue lipitor 80, lopressor - dapt for  at least 12 months   COPD with acute exacerbation ([place]) Fever Patient notes increased cough for the last several days and occasional shortness of breath, with diffuse wheezing heard on examination.  Husband at bedside also has a cough.  COVID-19 PCR negative. Procal wnl. No sputum production so holding on abx - continue prednisone 40 mg to complete a 5-day course - RVP pending - DuoNebs every 6 hours - Continue home bronchodilators  Debility PT advising snf but at this time patient declines, will re-assess tomorrow   AKI (acute kidney injury) ([place]) Resolved   Benign essential HTN Bp mild elevation - home arb, amlodipine   Ulcerative colitis ([place]) - Hold home prednisone is currently receiving higher dose for COPD exacerbation - Continue home sulfasalazine   Chronic pain syndrome - Continue home hydrocodone and gabapentin and duloxetine  Primary parkinsonism - Continue home Sinemet   DVT prophylaxis: lovenox Code Status: full Family Communication: husband updated @ bedside  Level of care: Telemetry Cardiac Status is: Inpatient Remains inpatient appropriate because: weakness and dyspnea    Consultants:  cardiology  Procedures: LHC  Antimicrobials:  none    Subjective: Feeling fatigued  Objective: Vitals:   10/19/22 0721 10/19/22 1011 10/19/22 1040 10/19/22 1108  BP: (!) 143/80   (!) 142/80  Pulse: 67   74  Resp: 18  (!) 26 20  Temp: 99.7 F (37.6 C) (!) 101.6 F (38.7 C) (!) 100.4 F (38 C) 99.2 F (37.3 C)  TempSrc:  Oral  Oral   SpO2: 92%   92%  Weight:      Height:        Intake/Output Summary (Last 24 hours) at 10/19/2022 1408 Last data filed at 10/19/2022 1114 Gross per 24 hour  Intake 1137.61 ml  Output 1650 ml  Net -512.39 ml   Filed Weights   10/18/22 1400 10/19/22 0605  Weight: 86.5 kg 85.7 kg    Examination:  General exam: Appears calm and comfortable  Respiratory system: mild tachypnea, exp wheeze Cardiovascular system: S1 &  S2 heard, RRR. No JVD, murmurs, rubs, gallops or clicks. No pedal edema. Gastrointestinal system: Abdomen is nondistended, soft and nontender. No organomegaly or masses felt. Normal bowel sounds heard. Central nervous system: Alert and oriented. No focal neurological deficits. Extremities: Symmetric 5 x 5 power. Skin: No rashes, lesions or ulcers Psychiatry: Judgement and insight appear normal. Mood & affect appropriate.     Data Reviewed: I have personally reviewed following labs and imaging studies  CBC: Recent Labs  Lab 10/18/22 1232 10/19/22 0541  WBC 8.6 6.5  HGB 12.2 11.7*  HCT 38.1 35.5*  MCV 87.0 85.5  PLT 291 [id number]   Basic Metabolic Panel: Recent Labs  Lab 10/18/22 1232 10/19/22 0541  NA 132* 134*  K 3.7 4.2  CL 96* 99  CO2 27 27  GLUCOSE 84 71  BUN 10 9  CREATININE 1.08* 0.89  CALCIUM 9.0 8.8*   GFR: Estimated Creatinine Clearance: 61.8 mL/min (by C-G formula based on SCr of 0.89 mg/dL). Liver Function Tests: No results for input(s): "AST", "ALT", "ALKPHOS", "BILITOT", "PROT", "ALBUMIN" in the last 168 hours. No results for input(s): "LIPASE", "AMYLASE" in the last 168 hours. No results for input(s): "AMMONIA" in the last 168 hours. Coagulation Profile: No results for input(s): "INR", "PROTIME" in the last 168 hours. Cardiac Enzymes: No results for input(s): "CKTOTAL", "CKMB", "CKMBINDEX", "TROPONINI" in the last 168 hours. BNP (last 3 results) No results for input(s): "PROBNP" in the last 8760 hours. HbA1C: No results for input(s): "HGBA1C" in the last 72 hours. CBG: No results for input(s): "GLUCAP" in the last 168 hours. Lipid Profile: Recent Labs    10/18/22 1232 10/19/22 0541  CHOL 178 158  HDL 100 84  LDLCALC 62 63  TRIG 80 56  CHOLHDL 1.8 1.9   Thyroid Function Tests: Recent Labs    10/18/22 1232  TSH 3.245   Anemia Panel: No results for input(s): "VITAMINB12", "FOLATE", "FERRITIN", "TIBC", "IRON", "RETICCTPCT" in the last 72  hours. Urine analysis:    Component Value Date/Time   COLORURINE YELLOW (A) 05/23/2021 1359   APPEARANCEUR CLEAR (A) 05/23/2021 1359   LABSPEC 1.010 05/23/2021 1359   PHURINE 7.0 05/23/2021 1359   GLUCOSEU NEGATIVE 05/23/2021 1359   HGBUR NEGATIVE 05/23/2021 [place] 05/23/2021 [place] 05/23/2021 1359   PROTEINUR NEGATIVE 05/23/2021 1359   NITRITE NEGATIVE 05/23/2021 1359   LEUKOCYTESUR MODERATE (A) 05/23/2021 1359   Sepsis Labs: @LABRCNTIP (procalcitonin:4,lacticidven:4)  ) Recent Results (from the past 240 hour(s))  SARS Coronavirus 2 by RT PCR (hospital order, performed in [facility] hospital lab) *cepheid single result test* Anterior Nasal Swab     Status: None   Collection Time: 10/18/22  1:19 PM   Specimen: Anterior Nasal Swab  Result Value Ref Range Status   SARS Coronavirus 2 by RT PCR NEGATIVE NEGATIVE Final    Comment: (NOTE) SARS-CoV-2 target nucleic acids are NOT DETECTED.  The SARS-CoV-2 RNA is generally detectable in  upper and lower respiratory specimens during the acute phase of infection. The lowest concentration of SARS-CoV-2 viral copies this assay can detect is 250 copies / mL. A negative result does not preclude SARS-CoV-2 infection and should not be used as the sole basis for treatment or other patient management decisions.  A negative result may occur with improper specimen collection / handling, submission of specimen other than nasopharyngeal swab, presence of viral mutation(s) within the areas targeted by this assay, and inadequate number of viral copies (<250 copies / mL). A negative result must be combined with clinical observations, patient history, and epidemiological information.  Fact Sheet for Patients:   https://www.patel.info/  Fact Sheet for Healthcare Providers: https://hall.com/  This test is not yet approved or  cleared by the [place] FDA and has  been authorized for detection and/or diagnosis of SARS-CoV-2 by FDA under an Emergency Use Authorization (EUA).  This EUA will remain in effect (meaning this test can be used) for the duration of the COVID-19 declaration under Section 564(b)(1) of the Act, 21 U.S.C. section 360bbb-3(b)(1), unless the authorization is terminated or revoked sooner.  Performed at [facility], [address] [place], [place] [address]          Radiology Studies: ECHOCARDIOGRAM COMPLETE  Result Date: 10/19/2022    ECHOCARDIOGRAM REPORT   Patient Name:   HAZELINE BACHRACH Date of Exam: 10/19/2022 Medical Rec #:  [id number]     Height:       65.0 in Accession #:    [id number]    Weight:       188.9 lb Date of Birth:  13-Sep-1950     BSA:          1.931 m Patient Age:    89 years      BP:           143/80 mmHg Patient Gender: F             HR:           67 bpm. Exam Location:  [facility] Procedure: 2D Echo, Cardiac Doppler and Color Doppler Indications:     Chest pain R07.9  History:         Patient has no prior history of Echocardiogram examinations.                  COPD, TIA and Stroke; Risk Factors:Hypertension.  Sonographer:     Sherrie Sport Referring Phys:  [place] Diagnosing Phys: Ida Rogue MD  Sonographer Comments: Suboptimal parasternal window. Image acquisition challenging due to COPD. IMPRESSIONS  1. Left ventricular ejection fraction, by estimation, is 60 to 65%. The left ventricle has normal function. The left ventricle has no regional wall motion abnormalities. Left ventricular diastolic parameters are consistent with Grade I diastolic dysfunction (impaired relaxation).  2. Right ventricular systolic function is normal. The right ventricular size is normal. Tricuspid regurgitation signal is inadequate for assessing PA pressure.  3. The mitral valve is normal in structure. No evidence of mitral valve regurgitation. No evidence of mitral stenosis.  4. The aortic valve is normal in structure.  Aortic valve regurgitation is not visualized. Aortic valve sclerosis is present, with no evidence of aortic valve stenosis.  5. The inferior vena cava is normal in size with greater than 50% respiratory variability, suggesting right atrial pressure of 3 mmHg. FINDINGS  Left Ventricle: Left ventricular ejection fraction, by estimation, is 60 to 65%. The left ventricle has normal function. The left  ventricle has no regional wall motion abnormalities. The left ventricular internal cavity size was normal in size. There is  no left ventricular hypertrophy. Left ventricular diastolic parameters are consistent with Grade I diastolic dysfunction (impaired relaxation). Right Ventricle: The right ventricular size is normal. No increase in right ventricular wall thickness. Right ventricular systolic function is normal. Tricuspid regurgitation signal is inadequate for assessing PA pressure. Left Atrium: Left atrial size was normal in size. Right Atrium: Right atrial size was normal in size. Pericardium: There is no evidence of pericardial effusion. Mitral Valve: The mitral valve is normal in structure. No evidence of mitral valve regurgitation. No evidence of mitral valve stenosis. MV peak gradient, 5.8 mmHg. The mean mitral valve gradient is 3.0 mmHg. Tricuspid Valve: The tricuspid valve is normal in structure. Tricuspid valve regurgitation is not demonstrated. No evidence of tricuspid stenosis. Aortic Valve: The aortic valve is normal in structure. Aortic valve regurgitation is not visualized. Aortic valve sclerosis is present, with no evidence of aortic valve stenosis. Aortic valve mean gradient measures 8.0 mmHg. Aortic valve peak gradient measures 15.1 mmHg. Aortic valve area, by VTI measures 2.28 cm. Pulmonic Valve: The pulmonic valve was normal in structure. Pulmonic valve regurgitation is not visualized. No evidence of pulmonic stenosis. Aorta: The aortic root is normal in size and structure. Venous: The inferior vena  cava is normal in size with greater than 50% respiratory variability, suggesting right atrial pressure of 3 mmHg. IAS/Shunts: No atrial level shunt detected by color flow Doppler.  LEFT VENTRICLE PLAX 2D LVIDd:         4.10 cm   Diastology LVIDs:         2.90 cm   LV e' medial:    7.62 cm/s LV PW:         1.00 cm   LV E/e' medial:  13.6 LV IVS:        0.80 cm   LV e' lateral:   10.80 cm/s LVOT diam:     2.00 cm   LV E/e' lateral: 9.6 LV SV:         76 LV SV Index:   39 LVOT Area:     3.14 cm  RIGHT VENTRICLE RV Basal diam:  3.20 cm RV Mid diam:    2.20 cm LEFT ATRIUM             Index        RIGHT ATRIUM           Index LA diam:        2.05 cm 1.06 cm/m   RA Area:     14.70 cm LA Vol (A2C):   26.7 ml 13.83 ml/m  RA Volume:   40.20 ml  20.82 ml/m LA Vol (A4C):   22.8 ml 11.81 ml/m LA Biplane Vol: 25.2 ml 13.05 ml/m  AORTIC VALVE AV Area (Vmax):    2.22 cm AV Area (Vmean):   2.15 cm AV Area (VTI):     2.28 cm AV Vmax:           194.00 cm/s AV Vmean:          130.000 cm/s AV VTI:            0.333 m AV Peak Grad:      15.1 mmHg AV Mean Grad:      8.0 mmHg LVOT Vmax:         137.00 cm/s LVOT Vmean:        89.100 cm/s LVOT  VTI:          0.242 m LVOT/AV VTI ratio: 0.73  AORTA Ao Root diam: 2.50 cm MITRAL VALVE                TRICUSPID VALVE MV Area (PHT): 3.39 cm     TR Peak grad:   5.5 mmHg MV Area VTI:   2.62 cm     TR Vmax:        117.00 cm/s MV Peak grad:  5.8 mmHg MV Mean grad:  3.0 mmHg     SHUNTS MV Vmax:       1.20 m/s     Systemic VTI:  0.24 m MV Vmean:      77.6 cm/s    Systemic Diam: 2.00 cm MV Decel Time: 224 msec MV E velocity: 104.00 cm/s MV A velocity: 92.80 cm/s MV E/A ratio:  1.12 Ida Rogue MD Electronically signed by Ida Rogue MD Signature Date/Time: 10/19/2022/12:34:34 PM    Final    CARDIAC CATHETERIZATION  Result Date: 10/18/2022   Prox RCA lesion is 85% stenosed.   A drug-eluting stent was successfully placed using a STENT ONYX FRONTIER 3.5X15.   Post intervention, there is a  0% residual stenosis.   The left ventricular systolic function is normal.   LV end diastolic pressure is mildly elevated.   The left ventricular ejection fraction is 55-65% by visual estimate. 1.  Severe one-vessel coronary artery disease with 85% hazy eccentric stenosis in the proximal right coronary artery which is the likely culprit for unstable angina.  The LAD is mildly calcified but no significant stenosis. 2.  Normal LV systolic function and mildly elevated left ventricular end-diastolic pressure. 3.  Successful drug-eluting stent placement to the proximal right coronary artery. Recommendations: Dual antiplatelet therapy for at least 12 months. Aggressive treatment of risk factors. Likely discharge home tomorrow if she remains stable.   DG Chest 2 View  Result Date: 10/18/2022 CLINICAL DATA:  Chest pain EXAM: CHEST - 2 VIEW COMPARISON:  Previous studies including CT done on 10/05/2022 FINDINGS: Cardiac size is within normal limits. Increase in AP diameter of chest suggests COPD. Lung fields are clear of any infiltrates or pulmonary edema. There is no pleural effusion or pneumothorax. There is large fixed hiatal hernia. IMPRESSION: COPD.  Lung fields are clear of any infiltrates or pulmonary edema. Large fixed hiatal hernia. Electronically Signed   By: Elmer Picker M.D.   On: 10/18/2022 13:11        Scheduled Meds:  amLODipine  10 mg Oral BH-q7a   aspirin  324 mg Oral NOW   Or   aspirin  300 mg Rectal NOW   aspirin EC  81 mg Oral Daily   atorvastatin  80 mg Oral Daily   buPROPion  150 mg Oral BID   carbidopa-levodopa  1 tablet Oral TID   citalopram  20 mg Oral BH-q7a   clopidogrel  75 mg Oral Q breakfast   DULoxetine  60 mg Oral Daily   enoxaparin (LOVENOX) injection  40 mg Subcutaneous Q24H   gabapentin  900 mg Oral BID   hydrOXYzine  25 mg Oral QHS   ipratropium-albuterol  3 mL Nebulization BID   metoprolol tartrate  25 mg Oral BID   mometasone-formoterol  2 puff Inhalation  BID   pantoprazole  20 mg Oral Daily   pentosan polysulfate  100 mg Oral TID   predniSONE  40 mg Oral Q breakfast   sodium chloride flush  3  mL Intravenous Q12H   sodium chloride flush  3 mL Intravenous Q12H   sulfaSALAzine  500 mg Oral BID   Continuous Infusions:  sodium chloride       LOS: 0 days     Desma Maxim, MD [facility]   If 7PM-7AM, please contact night-coverage [email or web address] Password [facility] 10/19/2022, 2:08 PM

## 2022-10-19 NOTE — Progress Notes (Signed)
Progress Note  Patient Name: Tammy Lamb Date of Encounter: 10/19/2022  Primary Cardiologist: Ag[place]Etang  Subjective   No chest pain. Still with some dyspnea, weakness, and dizziness. Family present notes the patient has worsening vertigo when laying supine. Post cath labs stable. T max 100.4 currently.   Inpatient Medications    Scheduled Meds:  amLODipine  10 mg Oral BH-q7a   aspirin  324 mg Oral NOW   Or   aspirin  300 mg Rectal NOW   aspirin EC  81 mg Oral Daily   atorvastatin  80 mg Oral Daily   buPROPion  150 mg Oral BID   carbidopa-levodopa  1 tablet Oral TID   citalopram  20 mg Oral BH-q7a   clopidogrel  75 mg Oral Q breakfast   DULoxetine  60 mg Oral Daily   enoxaparin (LOVENOX) injection  40 mg Subcutaneous Q24H   gabapentin  900 mg Oral BID   hydrOXYzine  25 mg Oral QHS   ipratropium-albuterol  3 mL Nebulization BID   metoprolol tartrate  25 mg Oral BID   mometasone-formoterol  2 puff Inhalation BID   pentosan polysulfate  100 mg Oral TID   predniSONE  40 mg Oral Q breakfast   sodium chloride flush  3 mL Intravenous Q12H   sodium chloride flush  3 mL Intravenous Q12H   sulfaSALAzine  500 mg Oral BID   Continuous Infusions:  sodium chloride     PRN Meds: sodium chloride, acetaminophen, benzonatate, HYDROcodone-acetaminophen, nitroGLYCERIN, ondansetron (ZOFRAN) IV, sodium chloride flush, tiZANidine   Vital Signs    Vitals:   10/19/22 0610 10/19/22 0721 10/19/22 1011 10/19/22 1040  BP: (!) 159/81 (!) 143/80    Pulse: 70 67    Resp: 18 18  (!) 26  Temp: 99.4 F (37.4 C) 99.7 F (37.6 C) (!) 101.6 F (38.7 C) (!) 100.4 F (38 C)  TempSrc:   Oral Oral  SpO2: 91% 92%    Weight:      Height:        Intake/Output Summary (Last 24 hours) at 10/19/2022 1054 Last data filed at 10/19/2022 0700 Gross per 24 hour  Intake 1137.61 ml  Output 1250 ml  Net -112.39 ml   Filed Weights   10/18/22 1400 10/19/22 0605  Weight: 86.5 kg 85.7 kg     Telemetry    SR with PACs and PVCs - Personally Reviewed  ECG    NSR, 86 bpm, PACs, nonspecific st/t changes - Personally Reviewed  Physical Exam   GEN: No acute distress.   Neck: No JVD. Cardiac: RRR, no murmurs, rubs, or gallops. Mild bruising along the right arteriotomy site without active bleeding, swelling, warmth, erythema, or TTP. Radial pulse 2+ proximal and distal to the arteriotomy site.  Respiratory: Diminished breath sounds bilaterally.  GI: Soft, nontender, non-distended.   MS: No edema; No deformity. Neuro:  Alert and oriented x 3; Nonfocal.  Psych: Normal affect.  Labs    Chemistry Recent Labs  Lab 10/18/22 1232 10/19/22 0541  NA 132* 134*  K 3.7 4.2  CL 96* 99  CO2 27 27  GLUCOSE 84 71  BUN 10 9  CREATININE 1.08* 0.89  CALCIUM 9.0 8.8*  GFRNONAA 55* >60  ANIONGAP 9 8     Hematology Recent Labs  Lab 10/18/22 1232 10/19/22 0541  WBC 8.6 6.5  RBC 4.38 4.15  HGB 12.2 11.7*  HCT 38.1 35.5*  MCV 87.0 85.5  MCH 27.9 28.2  MCHC 32.0 33.0  RDW 15.0 15.0  PLT 291 238    Cardiac EnzymesNo results for input(s): "TROPONINI" in the last 168 hours. No results for input(s): "TROPIPOC" in the last 168 hours.   BNP Recent Labs  Lab 10/18/22 1232  BNP 114.6*     DDimer No results for input(s): "DDIMER" in the last 168 hours.   Radiology      DG Chest 2 View  Result Date: 10/18/2022 IMPRESSION: COPD.  Lung fields are clear of any infiltrates or pulmonary edema. Large fixed hiatal hernia. Electronically Signed   By: Elmer Picker M.D.   On: 10/18/2022 13:11    Cardiac Studies   L[facility] 10/18/2022:   Prox RCA lesion is 85% stenosed.   A drug-eluting stent was successfully placed using a STENT ONYX FRONTIER 3.5X15.   Post intervention, there is a 0% residual stenosis.   The left ventricular systolic function is normal.   LV end diastolic pressure is mildly elevated.   The left ventricular ejection fraction is 55-65% by visual estimate.    1.  Severe one-vessel coronary artery disease with 85% hazy eccentric stenosis in the proximal right coronary artery which is the likely culprit for unstable angina.  The LAD is mildly calcified but no significant stenosis. 2.  Normal LV systolic function and mildly elevated left ventricular end-diastolic pressure. 3.  Successful drug-eluting stent placement to the proximal right coronary artery.   Recommendations: Dual antiplatelet therapy for at least 12 months. Aggressive treatment of risk factors. Likely discharge home tomorrow if she remains stable. __________  2D echo 10/19/2022: Pending  Patient Profile     72 y.o. female with history of coronary calcification noted on CT imaging earlier this month, HTN, HLD, COPD with ongoing tobacco use of 40+ years, and obesity who we are seeing for NSTEMI.  Assessment & Plan    1.  CAD involving the native coronary arteries with NSTEMI: -Without symptoms of angina -Status post PCI/DES to the RCA -Continue DAPT without interruption with aspirin and clopidogrel for at least 12 months -She does have a history of gastritis associated with aspirin use, start Protonix 20 mg daily -Echo pending -Post cath instructions -Cardiac rehab -Risk factor modification and secondary prevention -LP(a) pending, follow-up in the office -LDL 52 in 11/2021 with goal being less than 55, update lipid panel -PTA Lipitor 80 mg -Lopressor  2.  HTN: -Blood pressure mildly elevated, possibly in the setting of fever -Monitor, defer escalation of pharmacotherapy at this time  3.  HLD: -LDL 52 in 11/2021 with goal being less than 55 -Update lipid panel -PTA atorvastatin 80 mg -LP(a) pending, follow-up as outpatient  4.  COPD with ongoing tobacco use: -Cessation advised  5. Fever: -RVP pending -Per IM       For questions or updates, please contact [place] Please consult [email or web address] for contact info under Cardiology/STEMI.    Signed, Christell Faith, PA-C [place] Pager: [phone] 10/19/2022, 10:54 AM

## 2022-10-19 NOTE — Evaluation (Signed)
Physical Therapy Evaluation Patient Details Name: Tammy Lamb MRN: [id number] DOB: October 07, 1950 Today's Date: 10/19/2022  History of Present Illness  Tammy Lamb is a 72 y.o. female with medical history significant of CAD, hyperlipidemia, COPD, hypertension, hyperlipidemia, chronic pain, ulcerative colitis, who presents to the ED with complaints of chest pain. She went to be evaluated by her cardiologist on 10/18/22, who sent her emergently to the ED for catheterization due to concern for unstable angina. She is s/p L heart cath and angiography 10/18/22.   Clinical Impression  Patient received in bed, being cleaned by RN. Patient has fever and increased RR, however RN cleared for PT assessment. Patient is very shaky, and sob. She is agreeable to sitting edge of bed. Required mod A to get positioned at edge of bed. Able to sit with supervision. She reports dizziness with sitting up. (Son reports she has vertigo). Patient is able to stand at edge of bed from elevated surface with min +2 assist. She is able to side step up to head of bed ~5 feet with RW and min +2 assist. Patient reports legs feeling like they will buckle with this. She is very wobbly, and shaky with mobility. Patient will continue to benefit from skilled PT to improve functional mobility and independence for improved safety.        Recommendations for follow up therapy are one component of a multi-disciplinary discharge planning process, led by the attending physician.  Recommendations may be updated based on patient status, additional functional criteria and insurance authorization.  Follow Up Recommendations Can patient physically be transported by private vehicle: No     Assistance Recommended at Discharge Frequent or constant Supervision/Assistance  Patient can return home with the following  A lot of help with walking and/or transfers;A lot of help with bathing/dressing/bathroom;Help with stairs or ramp for entrance;Assistance  with cooking/housework;Direct supervision/assist for medications management;Assist for transportation    Equipment Recommendations None recommended by PT  Recommendations for Other Services       Functional Status Assessment Patient has had a recent decline in their functional status and demonstrates the ability to make significant improvements in function in a reasonable and predictable amount of time.     Precautions / Restrictions Precautions Precautions: Fall Precaution Comments: droplet precautions Restrictions Weight Bearing Restrictions: No      Mobility  Bed Mobility Overal bed mobility: Needs Assistance Bed Mobility: Supine to Sit     Supine to sit: Mod assist Sit to supine: Mod assist   General bed mobility comments: Mod A to elevate trunk to seated position in bed. Mod A to bring LEs on and off bed.    Transfers Overall transfer level: Needs assistance Equipment used: Rolling walker (2 wheels) Transfers: Sit to/from Stand Sit to Stand: Min assist, From elevated surface                Ambulation/Gait Ambulation/Gait assistance: Min assist, +2 safety/equipment Gait Distance (Feet):  (5' (side steps)) Assistive device: Rolling walker (2 wheels) Gait Pattern/deviations: Step-to pattern, Decreased step length - right, Decreased step length - left, Decreased stride length Gait velocity: decr     General Gait Details: patient was able to ambulate 5 feet sideways along edge of bed. Did not step away from bed due to reported dizziness, and legs feeling like they would give out.  Stairs            Wheelchair Mobility    Modified Rankin (Stroke Patients Only)  Balance Overall balance assessment: Needs assistance Sitting-balance support: Feet supported Sitting balance-Leahy Scale: Poor Sitting balance - Comments: reports feeling dizzy, unsteady in sitting   Standing balance support: Bilateral upper extremity supported, During functional  activity, Reliant on assistive device for balance Standing balance-Leahy Scale: Poor Standing balance comment: requires +2 support, very wobbly                             Pertinent Vitals/Pain Pain Assessment Pain Assessment: Faces Faces Pain Scale: Hurts little more Pain Location: all over, in chest w/ coughing, headache Pain Descriptors / Indicators: Aching, Headache, Pressure, Grimacing Pain Intervention(s): Monitored during session, Repositioned    Home Living Family/patient expects to be discharged to:: Private residence Living Arrangements: Spouse/significant other Available Help at Discharge: Family;Available 24 hours/day Type of Home: House Home Access: Stairs to enter Entrance Stairs-Rails: None Entrance Stairs-Number of Steps: 2-3   Home Layout: One level Home Equipment: Conservation officer, nature (2 wheels);Cane - single point;Shower seat;Wheelchair - manual;Hand held shower head;Toilet riser      Prior Function Prior Level of Function : Driving;Independent/Modified Independent       Physical Assist : Mobility (physical) Mobility (physical): Bed mobility;Transfers;Gait   Mobility Comments: conflicting info given, she told OT she uses walker, had 1 fall in last year. Told PT she does not use walker, has had no falls and that she drives at baseline. ADLs Comments: She reports being IND with BADL. Husband does all the shopping, errands outside the home. Pt and spouse share housekeeping.     Hand Dominance   Dominant Hand: Right    Extremity/Trunk Assessment   Upper Extremity Assessment Upper Extremity Assessment: Defer to OT evaluation    Lower Extremity Assessment Lower Extremity Assessment: Generalized weakness    Cervical / Trunk Assessment Cervical / Trunk Assessment: Normal  Communication   Communication: No difficulties  Cognition Arousal/Alertness: Awake/alert Behavior During Therapy: WFL for tasks assessed/performed Overall Cognitive Status:  Impaired/Different from baseline Area of Impairment: Memory, Problem solving                     Memory: Decreased short-term memory       Problem Solving: Requires verbal cues General Comments: Alert and oriented        General Comments General comments (skin integrity, edema, etc.): SpO2 88% sitting on EOB; temp 101.6. RN notified    Exercises     Assessment/Plan    PT Assessment Patient needs continued PT services  PT Problem List Decreased strength;Decreased activity tolerance;Decreased balance;Decreased mobility;Decreased safety awareness;Decreased knowledge of use of DME;Decreased cognition;Pain       PT Treatment Interventions DME instruction;Therapeutic exercise;Gait training;Balance training;Stair training;Functional mobility training;Therapeutic activities;Patient/family education;Neuromuscular re-education;Cognitive remediation    PT Goals (Current goals can be found in the Care Plan section)  Acute Rehab PT Goals Patient Stated Goal: to feel better, go home PT Goal Formulation: With patient/family Time For Goal Achievement: 11/01/22 Potential to Achieve Goals: Fair    Frequency Min 2X/week     Co-evaluation               AM-PAC PT "6 Clicks" Mobility  Outcome Measure Help needed turning from your back to your side while in a flat bed without using bedrails?: A Lot Help needed moving from lying on your back to sitting on the side of a flat bed without using bedrails?: A Lot Help needed moving to and from a bed to a  chair (including a wheelchair)?: A Lot Help needed standing up from a chair using your arms (e.g., wheelchair or bedside chair)?: A Lot Help needed to walk in hospital room?: A Lot Help needed climbing 3-5 steps with a railing? : Total 6 Click Score: 11    End of Session   Activity Tolerance: Patient limited by fatigue Patient left: in bed;with call bell/phone within reach;with bed alarm set;with family/visitor present Nurse  Communication: Mobility status PT Visit Diagnosis: Unsteadiness on feet (R26.81);Other abnormalities of gait and mobility (R26.89);Difficulty in walking, not elsewhere classified (R26.2);Muscle weakness (generalized) (M62.81);History of falling (Z91.81);Pain;Dizziness and giddiness (R42) Pain - part of body:  (all over, chest)    Time: [id number] PT Time Calculation (min) (ACUTE ONLY): 16 min   Charges:   PT Evaluation $PT Eval Moderate Complexity: 1 Mod          Nusrat Encarnacion, PT, GCS 10/19/22,11:31 AM

## 2022-10-19 NOTE — Progress Notes (Signed)
*  PRELIMINARY RESULTS* Echocardiogram 2D Echocardiogram has been performed.  Tammy Lamb 10/19/2022, 8:50 AM

## 2022-10-19 NOTE — Evaluation (Signed)
Occupational Therapy Evaluation Patient Details Name: Tammy Lamb MRN: [id number] DOB: 06-10-1951 Today's Date: 10/19/2022   History of Present Illness Tammy Lamb is a 72 y.o. female with medical history significant of CAD, hyperlipidemia, COPD, hypertension, hyperlipidemia, chronic pain, ulcerative colitis, who presents to the ED with complaints of chest pain. She went to be evaluated by her cardiologist on 10/18/22, who sent her emergently to the ED for catheterization due to concern for unstable angina. Tammy Lamb states that for the last 3 weeks, she has been experiencing daily chest pain that most frequently wakes her up in the middle the night due to the severity. It does occasionally occur during the day and typically only occurs when she is resting. She has not experienced any shortness of breath or chest pain with exertion.  She denies any orthopnea or lower extremity swelling. She notes that for the last 2 days, she has developed a productive cough that her husband also has. She endorses occasional shortness of breath but that states this has occurred in the past but denies SOB at present.   Clinical Impression   Tammy Lamb is received in bed, appearing ill, agitated, and uncomfortable, c/o pain "all over" and specifically citing chest pain with cough + headache. Pt reports dizziness with sitting, demonstrates poor sitting balance and requiring close SUPV. Pt feels warm to touch and SOB. Take vitals: SpO2 at 88%, temp at 101.6; RN notified. Pt reports she needs to have BM. Requires Mod-Max A +2 to transfer to [facility] w/ RW, pt very unstable in standing. Pt will benefit from ongoing OT during hospitalization; anticipate DC home with [place]. At baseline pt ambulates with RW, reports rarely leaving her house, being IND in BADL, with supportive husband available 24/7 and assisting with all IADL.     Recommendations for follow up therapy are one component of a multi-disciplinary discharge planning  process, led by the attending physician.  Recommendations may be updated based on patient status, additional functional criteria and insurance authorization.   Assistance Recommended at Discharge Frequent or constant Supervision/Assistance  Patient can return home with the following A lot of help with walking and/or transfers;A lot of help with bathing/dressing/bathroom;Assistance with cooking/housework;Assist for transportation;Help with stairs or ramp for entrance    Functional Status Assessment  Patient has had a recent decline in their functional status and demonstrates the ability to make significant improvements in function in a reasonable and predictable amount of time.  Equipment Recommendations  None recommended by OT    Recommendations for Other Services       Precautions / Restrictions Precautions Precautions: Fall Precaution Comments: droplet precautions Restrictions Weight Bearing Restrictions: No      Mobility Bed Mobility Overal bed mobility: Needs Assistance Bed Mobility: Supine to Sit, Sit to Supine     Supine to sit: Mod assist Sit to supine: Mod assist        Transfers Overall transfer level: Needs assistance Equipment used: Rolling walker (2 wheels) Transfers: Sit to/from Stand, Bed to chair/wheelchair/BSC Sit to Stand: Mod assist, +2 physical assistance Stand pivot transfers: +2 physical assistance, Max assist                Balance Overall balance assessment: Needs assistance Sitting-balance support: Bilateral upper extremity supported, Feet supported Sitting balance-Leahy Scale: Poor Sitting balance - Comments: reports feeling dizzy, unsteady in sitting   Standing balance support: Bilateral upper extremity supported Standing balance-Leahy Scale: Poor Standing balance comment: requires +2 support, very wobbly  ADL either performed or assessed with clinical judgement   ADL Overall ADL's : Needs  assistance/impaired Eating/Feeding: Modified independent                   Lower Body Dressing: Maximal assistance Lower Body Dressing Details (indicate cue type and reason): donning socks Toilet Transfer: Maximal assistance;+2 for safety/equipment;Stand-pivot;BSC/3in1;Rolling walker (2 wheels)   Toileting- Clothing Manipulation and Hygiene: Moderate assistance;Sit to/from stand               Vision         Perception     Praxis      Pertinent Vitals/Pain Pain Assessment Pain Assessment: Faces Faces Pain Scale: Hurts whole lot Pain Location: all over, in chest w/ coughing, headache Pain Descriptors / Indicators: Aching, Headache, Pressure, Grimacing Pain Intervention(s): RN gave pain meds during session, Limited activity within patient's tolerance, Repositioned     Hand Dominance Right   Extremity/Trunk Assessment Upper Extremity Assessment Upper Extremity Assessment: Generalized weakness   Lower Extremity Assessment Lower Extremity Assessment: Generalized weakness       Communication Communication Communication: No difficulties   Cognition Arousal/Alertness: Awake/alert Behavior During Therapy: WFL for tasks assessed/performed, Agitated Overall Cognitive Status: Within Functional Limits for tasks assessed                                 General Comments: A&O x 4; agitated and angry     General Comments  SpO2 88% sitting on EOB; temp 101.6. RN notified    Exercises Other Exercises Other Exercises: Pt feels warm to touch, take temp -- 101.6; focused on calming techniques, given pt agitation   Shoulder Instructions      Home Living Family/patient expects to be discharged to:: Private residence Living Arrangements: Spouse/significant other Available Help at Discharge: Family;Available 24 hours/day Type of Home: House Home Access: Stairs to enter [employer] of Steps: 3 Entrance Stairs-Rails: None Home Layout: One  level     Bathroom Shower/Tub: Occupational psychologist: Standard     Home Equipment: Conservation officer, nature (2 wheels);Cane - single point;Shower seat;Wheelchair - manual;Hand held shower head;Toilet riser          Prior Functioning/Environment Prior Level of Function : Needs assist             Mobility Comments: Uses RW at home, reports 1 fall in past year. States that she rarely leaves the house ADLs Comments: She reports being IND with BADL. Husband does all the shopping, errands outside the home. Pt and spouse share housekeeping. Pt does not drive        OT Problem List: Decreased strength;Decreased activity tolerance;Decreased coordination;Impaired balance (sitting and/or standing);Pain;Cardiopulmonary status limiting activity      OT Treatment/Interventions: Self-care/ADL training;Therapeutic exercise;Patient/family education;Balance training;Energy conservation;Therapeutic activities;DME and/or AE instruction    OT Goals(Current goals can be found in the care plan section) Acute Rehab OT Goals Patient Stated Goal: to go home OT Goal Formulation: With patient Time For Goal Achievement: 11/02/22 Potential to Achieve Goals: Good ADL Goals Pt Will Perform Grooming: standing;with supervision (standing at sink for 5+ minutes w/o dizziness) Pt Will Perform Lower Body Dressing: with supervision;sitting/lateral leans;sit to/from stand Pt Will Transfer to Toilet: with supervision;ambulating;regular height toilet;stand pivot transfer (using RW)  OT Frequency: Min 2X/week    Co-evaluation              AM-PAC OT "6 Clicks" Daily Activity  Outcome Measure Help from another person eating meals?: None Help from another person taking care of personal grooming?: A Little Help from another person toileting, which includes using toliet, bedpan, or urinal?: A Lot Help from another person bathing (including washing, rinsing, drying)?: A Lot Help from another person to put  on and taking off regular upper body clothing?: A Lot Help from another person to put on and taking off regular lower body clothing?: A Lot 6 Click Score: 15   End of Session Equipment Utilized During Treatment: Rolling walker (2 wheels) Nurse Communication: Mobility status;Other (comment) (fever, O2 sats)  Activity Tolerance: Treatment limited secondary to medical complications (Comment) (pt agitated, dizzy, poor sitting and standing balance) Patient left: with nursing/sitter in room;in chair;with call bell/phone within reach  OT Visit Diagnosis: Unsteadiness on feet (R26.81);Muscle weakness (generalized) (M62.81);Other abnormalities of gait and mobility (R26.89);Dizziness and giddiness (R42);Pain                Time: [id number] OT Time Calculation (min): 20 min Charges:  OT General Charges $OT Visit: 1 Visit OT Evaluation $OT Eval Moderate Complexity: 1 Mod OT Treatments $Self Care/Home Management : 8-22 mins Josiah Lobo, PhD, MS, OTR/L 10/19/22, 10:27 AM

## 2022-10-20 DIAGNOSIS — I200 Unstable angina: Secondary | ICD-10-CM | POA: Diagnosis not present

## 2022-10-20 DIAGNOSIS — J101 Influenza due to other identified influenza virus with other respiratory manifestations: Secondary | ICD-10-CM | POA: Diagnosis present

## 2022-10-20 DIAGNOSIS — B342 Coronavirus infection, unspecified: Secondary | ICD-10-CM | POA: Insufficient documentation

## 2022-10-20 LAB — CBC
HCT: 35.3 % — ABNORMAL LOW (ref 36.0–46.0)
Hemoglobin: 11.6 g/dL — ABNORMAL LOW (ref 12.0–15.0)
MCH: 28 pg (ref 26.0–34.0)
MCHC: 32.9 g/dL (ref 30.0–36.0)
MCV: 85.3 fL (ref 80.0–100.0)
Platelets: 232 10*3/uL (ref 150–400)
RBC: 4.14 MIL/uL (ref 3.87–5.11)
RDW: 14.7 % (ref 11.5–15.5)
WBC: 5.7 10*3/uL (ref 4.0–10.5)
nRBC: 0 % (ref 0.0–0.2)

## 2022-10-20 LAB — BASIC METABOLIC PANEL
Anion gap: 8 (ref 5–15)
BUN: 10 mg/dL (ref 8–23)
CO2: 25 mmol/L (ref 22–32)
Calcium: 8.7 mg/dL — ABNORMAL LOW (ref 8.9–10.3)
Chloride: 101 mmol/L (ref 98–111)
Creatinine, Ser: 0.86 mg/dL (ref 0.44–1.00)
GFR, Estimated: >60 mL/min (ref >60)
Glucose, Bld: 86 mg/dL (ref 70–99)
Potassium: 3.6 mmol/L (ref 3.5–5.1)
Sodium: 134 mmol/L — ABNORMAL LOW (ref 135–145)

## 2022-10-20 LAB — LIPOPROTEIN A (LPA): Lipoprotein (a): 47.2 nmol/L — ABNORMAL HIGH (ref <75.0)

## 2022-10-20 MED ORDER — CLOPIDOGREL BISULFATE 75 MG PO TABS: 75.0000 mg | ORAL_TABLET | Freq: Every day | ORAL | 1 refills | Status: DC

## 2022-10-20 MED ORDER — PREDNISONE 20 MG PO TABS: ORAL_TABLET | ORAL | Status: DC

## 2022-10-20 MED ORDER — OSELTAMIVIR PHOSPHATE 75 MG PO CAPS: 75.0000 mg | ORAL_CAPSULE | Freq: Two times a day (BID) | ORAL | 0 refills | Status: DC

## 2022-10-20 MED ORDER — OSELTAMIVIR PHOSPHATE 75 MG PO CAPS
75.0000 mg | ORAL_CAPSULE | Freq: Two times a day (BID) | ORAL | Status: DC
  Administered 2022-10-20: 75 mg via ORAL
  Filled 2022-10-20: qty 1

## 2022-10-20 MED ORDER — ASPIRIN 81 MG PO TBEC: 81.0000 mg | DELAYED_RELEASE_TABLET | Freq: Every day | ORAL | 12 refills | Status: DC

## 2022-10-20 MED ORDER — METOPROLOL TARTRATE 25 MG PO TABS: 25.0000 mg | ORAL_TABLET | Freq: Two times a day (BID) | ORAL | 1 refills | Status: DC

## 2022-10-20 NOTE — Discharge Summary (Signed)
Tammy Lamb [id number] DOB: April 11, 1951 DOA: 10/18/2022  PCP: Gladstone Lighter, MD  Admit date: 10/18/2022 Discharge date: 10/20/2022  Time spent: 35 minutes  Recommendations for Outpatient Follow-up:  Close cardiology and pcp f/u     Discharge Diagnoses:  Principal Problem:   Unstable angina [facility]) Active Problems:   COPD with acute exacerbation ([place])   AKI (acute kidney injury) ([place])   Benign essential HTN   Ulcerative colitis ([place])   Chronic pain syndrome   Primary parkinsonism   Chest pain   Coronary artery disease involving native coronary artery of native heart with unstable angina pectoris ([place])   Status post primary angioplasty with coronary stent   Influenza A   Coronavirus infection   Discharge Condition: improved  Diet recommendation: heart healthy  Filed Weights   10/18/22 1400 10/19/22 0605 10/20/22 0513  Weight: 86.5 kg 85.7 kg 84.7 kg    History of present illness:  From admission h and p Tammy Lamb is a 72 y.o. female with medical history significant of CAD, hyperlipidemia, COPD, hypertension, hyperlipidemia, chronic pain, ulcerative colitis, who presents to the ED with complaints of chest pain.   Tammy Lamb states that for the last 3 weeks, she has been experiencing daily chest pain that most frequently wakes her up in the middle the night due to the severity.  It does occasionally occur during the day and typically only occurs when she is resting.  She has not experienced any shortness of breath or chest pain with exertion.  She denies any orthopnea or lower extremity swelling.     She notes that for the last 2 days, she has developed a productive cough that her husband also has.  She endorses occasional shortness of breath but that states this has occurred in the past.  She denies shortness of breath at this time.   Per chart review, she went to be evaluated by her cardiologist today, who sent her emergently to the ED for catheterization due to  concern for unstable angina.  Hospital Course:  Patient presented with symptoms consistent with unstable angina. LHC on 3/27 showed 85% stenosis of RCA, likely the culprit lesion. One DES was placed. TTE with no significant abnormalities. Hemodynamically stable, chest pain resolved. Also with several days cough and wheezing. Respiratory viral panel positive for influanza and coronavirus (not covid-19). Treated for copd exacerbation/influenza with steroids and tamiflu which will be continued at discharge. On hospital day one patient was feeling very poorly mainly from the flu and PT advised SNF but by hospital day 2 patient feeling much improved, ambulated well, and PT downgraded rec to home health. Will order [facility] PT as well as cardiac rehab. New meds are aspirin and plavix and metoprolol. Will need close PCP and cardiology f/u.   Procedures: Left heart cath   Consultations: cardiology  Discharge Exam: Vitals:   10/20/22 0511 10/20/22 0748  BP: (!) 146/70 (!) 145/76  Pulse: (!) 57 (!) 57  Resp: 16 18  Temp: 98.5 F (36.9 C) 98.7 F (37.1 C)  SpO2: 96% 94%    General: NAD Cardiovascular: RRR Respiratory: few exp wheeze  Discharge Instructions   Discharge Instructions     AMB Referral to Cardiac Rehabilitation - Phase II   Complete by: As directed    Diagnosis: Coronary Stents   After initial evaluation and assessments completed: Virtual Based Care may be provided alone or in conjunction with Phase 2 Cardiac Rehab based on patient barriers.: Yes   Intensive Cardiac Rehabilitation (  ICR) [place] location only OR Traditional Cardiac Rehabilitation (TCR) *If criteria for ICR are not met will enroll in TCR [facility] only): Yes   Diet - low sodium heart healthy   Complete by: As directed    Face-to-face encounter (required for Medicare/Medicaid patients)   Complete by: As directed    I Desma Maxim certify that this patient is under my care and that I, or a nurse practitioner or physician's  assistant working with me, had a face-to-face encounter that meets the physician face-to-face encounter requirements with this patient on 10/20/2022. The encounter with the patient was in whole, or in part for the following medical condition(s) which is the primary reason for home health care (List medical condition): unstable angina, copd   The encounter with the patient was in whole, or in part, for the following medical condition, which is the primary reason for home health care: unstable angina   I certify that, based on my findings, the following services are medically necessary home health services: Physical therapy   Reason for Medically Necessary Home Health Services: Therapy- Therapeutic Exercises to Increase Strength and Endurance   My clinical findings support the need for the above services: Shortness of breath with activity   Further, I certify that my clinical findings support that this patient is homebound due to: Shortness of Breath with activity   Home Health   Complete by: As directed    To provide the following care/treatments:  PT OT     Increase activity slowly   Complete by: As directed       Allergies as of 10/20/2022       Reactions   Aspirin Other (See Comments)   GERD   Sulfa Antibiotics Nausea And Vomiting        Medication List     STOP taking these medications    amitriptyline 25 MG tablet Commonly known as: ELAVIL   hydrochlorothiazide 25 MG tablet Commonly known as: HYDRODIURIL   propranolol 20 MG tablet Commonly known as: INDERAL       TAKE these medications    acetaminophen 500 MG tablet Commonly known as: TYLENOL Take 1,000 mg by mouth every 6 (six) hours as needed for moderate pain or headache.   amLODipine 10 MG tablet Commonly known as: NORVASC Take 10 mg by mouth every morning.   ascorbic acid 1000 MG tablet Commonly known as: VITAMIN C Take 1,000 mg by mouth at bedtime.   aspirin EC 81 MG tablet Take 1 tablet (81 mg total)  by mouth daily. Swallow whole. Start taking on: October 21, 2022   atorvastatin 80 MG tablet Commonly known as: LIPITOR Take 80 mg by mouth at bedtime.   buPROPion 150 MG 12 hr tablet Commonly known as: WELLBUTRIN SR Take 150 mg by mouth 2 (two) times daily.   carbidopa-levodopa 25-100 MG tablet Commonly known as: SINEMET IR Take 1 tablet by mouth 3 (three) times daily.   cephALEXin 250 MG capsule Commonly known as: KEFLEX Take 250 mg by mouth every morning.   cholecalciferol 25 MCG (1000 UT) tablet Generic drug: Cholecalciferol Take 1,000 Units by mouth at bedtime.   Cinnamon 500 MG Tabs Take 500 mg by mouth in the morning and at bedtime.   citalopram 20 MG tablet Commonly known as: CELEXA Take 20 mg by mouth every morning.   cloNIDine 0.1 MG tablet Commonly known as: CATAPRES Take 0.1 mg by mouth at bedtime as needed (HTN: SBP over 165). Only take PM if  HTN over 165   clopidogrel 75 MG tablet Commonly known as: PLAVIX Take 1 tablet (75 mg total) by mouth daily with breakfast. Start taking on: October 21, 2022   cyanocobalamin 1000 MCG tablet Commonly known as: VITAMIN B12 Take 1,000 mcg by mouth daily.   DULoxetine 60 MG capsule Commonly known as: CYMBALTA Take 60 mg by mouth daily.   Fish Oil 1200 MG Caps Take 1,200 mg by mouth daily.   fluticasone-salmeterol 250-50 MCG/ACT Aepb Commonly known as: ADVAIR Inhale 1 puff into the lungs in the morning and at bedtime.   gabapentin 300 MG capsule Commonly known as: Neurontin Take 3 capsules (900 mg total) by mouth 2 (two) times daily.   HYDROcodone-acetaminophen 7.5-325 MG tablet Commonly known as: Norco Take 1 tablet by mouth every 8 (eight) hours as needed for severe pain. Must last 30 days.   HYDROcodone-acetaminophen 7.5-325 MG tablet Commonly known as: Norco Take 1 tablet by mouth every 8 (eight) hours as needed for severe pain. Must last 30 days. Start taking on: November 05, 2022    HYDROcodone-acetaminophen 7.5-325 MG tablet Commonly known as: Norco Take 1 tablet by mouth every 8 (eight) hours as needed for severe pain. Must last 30 days. Start taking on: Dec 05, 2022   hydrOXYzine 25 MG tablet Commonly known as: ATARAX Take 25 mg by mouth at bedtime.   magnesium oxide 400 MG tablet Commonly known as: MAG-OX Take 400 mg by mouth at bedtime.   metoprolol tartrate 25 MG tablet Commonly known as: LOPRESSOR Take 1 tablet (25 mg total) by mouth 2 (two) times daily.   Omeprazole-Sodium Bicarbonate 20-1100 MG Caps capsule Commonly known as: ZEGERID Take 1 capsule by mouth in the morning and at bedtime.   oseltamivir 75 MG capsule Commonly known as: TAMIFLU Take 1 capsule (75 mg total) by mouth 2 (two) times daily.   oxybutynin 10 MG 24 hr tablet Commonly known as: DITROPAN-XL Take 10 mg by mouth at bedtime.   pentosan polysulfate 100 MG capsule Commonly known as: ELMIRON Take 100 mg by mouth 2 (two) times daily.   predniSONE 20 MG tablet Commonly known as: DELTASONE Take 2 tabs daily for the next 3 days then resume normal dose What changed:  how much to take how to take this when to take this additional instructions   sulfaSALAzine 500 MG tablet Commonly known as: AZULFIDINE Take 500 mg by mouth 2 (two) times daily.   tiZANidine 4 MG tablet Commonly known as: ZANAFLEX Take 1 tablet (4 mg total) by mouth every 8 (eight) hours as needed.   valsartan 160 MG tablet Commonly known as: DIOVAN Take 160 mg by mouth daily.   Ventolin HFA 108 (90 Base) MCG/ACT inhaler Generic drug: albuterol Inhale 2 puffs into the lungs every 6 (six) hours as needed for wheezing or shortness of breath.       Allergies  Allergen Reactions   Aspirin Other (See Comments)    GERD   Sulfa Antibiotics Nausea And Vomiting    Follow-up Information     Kate Sable, MD Follow up.   Specialties: Cardiology, Radiology Contact information: [place] [address] [phone]         Gladstone Lighter, MD Follow up.   Specialty: Internal Medicine Contact information: [place] [address] [phone]                  The results of significant diagnostics from this hospitalization (including imaging,  microbiology, ancillary and laboratory) are listed below for reference.    Significant Diagnostic Studies: ECHOCARDIOGRAM COMPLETE  Result Date: 10/19/2022    ECHOCARDIOGRAM REPORT   Patient Name:   Tammy Lamb Date of Exam: 10/19/2022 Medical Rec #:  [id number]     Height:       65.0 in Accession #:    [id number]    Weight:       188.9 lb Date of Birth:  1951-07-06     BSA:          1.931 m Patient Age:    42 years      BP:           143/80 mmHg Patient Gender: F             HR:           67 bpm. Exam Location:  [facility] Procedure: 2D Echo, Cardiac Doppler and Color Doppler Indications:     Chest pain R07.9  History:         Patient has no prior history of Echocardiogram examinations.                  COPD, TIA and Stroke; Risk Factors:Hypertension.  Sonographer:     Sherrie Sport Referring Phys:  [place] Diagnosing Phys: Ida Rogue MD  Sonographer Comments: Suboptimal parasternal window. Image acquisition challenging due to COPD. IMPRESSIONS  1. Left ventricular ejection fraction, by estimation, is 60 to 65%. The left ventricle has normal function. The left ventricle has no regional wall motion abnormalities. Left ventricular diastolic parameters are consistent with Grade I diastolic dysfunction (impaired relaxation).  2. Right ventricular systolic function is normal. The right ventricular size is normal. Tricuspid regurgitation signal is inadequate for assessing PA pressure.  3. The mitral valve is normal in structure. No evidence of mitral valve regurgitation. No evidence of mitral stenosis.  4. The aortic valve is normal in structure. Aortic valve regurgitation is not visualized. Aortic  valve sclerosis is present, with no evidence of aortic valve stenosis.  5. The inferior vena cava is normal in size with greater than 50% respiratory variability, suggesting right atrial pressure of 3 mmHg. FINDINGS  Left Ventricle: Left ventricular ejection fraction, by estimation, is 60 to 65%. The left ventricle has normal function. The left ventricle has no regional wall motion abnormalities. The left ventricular internal cavity size was normal in size. There is  no left ventricular hypertrophy. Left ventricular diastolic parameters are consistent with Grade I diastolic dysfunction (impaired relaxation). Right Ventricle: The right ventricular size is normal. No increase in right ventricular wall thickness. Right ventricular systolic function is normal. Tricuspid regurgitation signal is inadequate for assessing PA pressure. Left Atrium: Left atrial size was normal in size. Right Atrium: Right atrial size was normal in size. Pericardium: There is no evidence of pericardial effusion. Mitral Valve: The mitral valve is normal in structure. No evidence of mitral valve regurgitation. No evidence of mitral valve stenosis. MV peak gradient, 5.8 mmHg. The mean mitral valve gradient is 3.0 mmHg. Tricuspid Valve: The tricuspid valve is normal in structure. Tricuspid valve regurgitation is not demonstrated. No evidence of tricuspid stenosis. Aortic Valve: The aortic valve is normal in structure. Aortic valve regurgitation is not visualized. Aortic valve sclerosis is present, with no evidence of aortic valve stenosis. Aortic valve mean gradient measures 8.0 mmHg. Aortic valve peak gradient measures 15.1 mmHg. Aortic valve area, by VTI measures 2.28 cm. Pulmonic Valve: The pulmonic valve  was normal in structure. Pulmonic valve regurgitation is not visualized. No evidence of pulmonic stenosis. Aorta: The aortic root is normal in size and structure. Venous: The inferior vena cava is normal in size with greater than 50%  respiratory variability, suggesting right atrial pressure of 3 mmHg. IAS/Shunts: No atrial level shunt detected by color flow Doppler.  LEFT VENTRICLE PLAX 2D LVIDd:         4.10 cm   Diastology LVIDs:         2.90 cm   LV e' medial:    7.62 cm/s LV PW:         1.00 cm   LV E/e' medial:  13.6 LV IVS:        0.80 cm   LV e' lateral:   10.80 cm/s LVOT diam:     2.00 cm   LV E/e' lateral: 9.6 LV SV:         76 LV SV Index:   39 LVOT Area:     3.14 cm  RIGHT VENTRICLE RV Basal diam:  3.20 cm RV Mid diam:    2.20 cm LEFT ATRIUM             Index        RIGHT ATRIUM           Index LA diam:        2.05 cm 1.06 cm/m   RA Area:     14.70 cm LA Vol (A2C):   26.7 ml 13.83 ml/m  RA Volume:   40.20 ml  20.82 ml/m LA Vol (A4C):   22.8 ml 11.81 ml/m LA Biplane Vol: 25.2 ml 13.05 ml/m  AORTIC VALVE AV Area (Vmax):    2.22 cm AV Area (Vmean):   2.15 cm AV Area (VTI):     2.28 cm AV Vmax:           194.00 cm/s AV Vmean:          130.000 cm/s AV VTI:            0.333 m AV Peak Grad:      15.1 mmHg AV Mean Grad:      8.0 mmHg LVOT Vmax:         137.00 cm/s LVOT Vmean:        89.100 cm/s LVOT VTI:          0.242 m LVOT/AV VTI ratio: 0.73  AORTA Ao Root diam: 2.50 cm MITRAL VALVE                TRICUSPID VALVE MV Area (PHT): 3.39 cm     TR Peak grad:   5.5 mmHg MV Area VTI:   2.62 cm     TR Vmax:        117.00 cm/s MV Peak grad:  5.8 mmHg MV Mean grad:  3.0 mmHg     SHUNTS MV Vmax:       1.20 m/s     Systemic VTI:  0.24 m MV Vmean:      77.6 cm/s    Systemic Diam: 2.00 cm MV Decel Time: 224 msec MV E velocity: 104.00 cm/s MV A velocity: 92.80 cm/s MV E/A ratio:  1.12 Ida Rogue MD Electronically signed by Ida Rogue MD Signature Date/Time: 10/19/2022/12:34:34 PM    Final    CARDIAC CATHETERIZATION  Result Date: 10/18/2022   Prox RCA lesion is 85% stenosed.   A drug-eluting stent was successfully placed using a STENT ONYX FRONTIER 3.5X15.  Post intervention, there is a 0% residual stenosis.   The left ventricular  systolic function is normal.   LV end diastolic pressure is mildly elevated.   The left ventricular ejection fraction is 55-65% by visual estimate. 1.  Severe one-vessel coronary artery disease with 85% hazy eccentric stenosis in the proximal right coronary artery which is the likely culprit for unstable angina.  The LAD is mildly calcified but no significant stenosis. 2.  Normal LV systolic function and mildly elevated left ventricular end-diastolic pressure. 3.  Successful drug-eluting stent placement to the proximal right coronary artery. Recommendations: Dual antiplatelet therapy for at least 12 months. Aggressive treatment of risk factors. Likely discharge home tomorrow if she remains stable.   DG Chest 2 View  Result Date: 10/18/2022 CLINICAL DATA:  Chest pain EXAM: CHEST - 2 VIEW COMPARISON:  Previous studies including CT done on 10/05/2022 FINDINGS: Cardiac size is within normal limits. Increase in AP diameter of chest suggests COPD. Lung fields are clear of any infiltrates or pulmonary edema. There is no pleural effusion or pneumothorax. There is large fixed hiatal hernia. IMPRESSION: COPD.  Lung fields are clear of any infiltrates or pulmonary edema. Large fixed hiatal hernia. Electronically Signed   By: Elmer Picker M.D.   On: 10/18/2022 13:11   CT CARDIAC SCORING (SELF PAY ONLY)  Addendum Date: 10/08/2022   ADDENDUM REPORT: 10/08/2022 15:39 EXAM: OVER-READ INTERPRETATION  CT CHEST The following report is an over-read performed by radiologist Dr. Mina Marble [facility] on 10/08/2022. This over-read does not include interpretation of cardiac or coronary anatomy or pathology. The coronary calcium score interpretation by the cardiologist is attached. COMPARISON:  Chest CT 05/09/2022 FINDINGS: Vascular: Mild aortic atherosclerosis. The included aorta is normal in caliber. Mediastinum/nodes: No adenopathy or mass. Large hiatal and paraesophageal hernia. Majority of the stomach  is intrathoracic. Moderate length segment of transverse colon extends into the hiatal hernia sac. Overall findings are not significantly changed from prior exam. Lungs: Compressive atelectasis related to hiatal hernia, no acute airspace disease. Scattered small pulmonary nodules which were better assessed on prior lung cancer sing screening exam. No pleural fluid. The included airways are patent. Upper abdomen: No acute or unexpected findings. Musculoskeletal: There are no acute or suspicious osseous abnormalities. Thoracic spondylosis. IMPRESSION: 1.  Aortic Atherosclerosis (ICD10-I70.0). 2. Redemonstrated large hiatal hernia as well as paraesophageal hernia containing segment of transverse colon. Electronically Signed   By: Keith Rake M.D.   On: 10/08/2022 15:39   Result Date: 10/08/2022 CLINICAL DATA:  Risk stratification EXAM: Coronary Calcium Score TECHNIQUE: The patient was scanned on a [employer]. Axial non-contrast 3 mm slices were carried out through the heart. The data set was analyzed on a dedicated work station and scored using the [place]. FINDINGS: Non-cardiac: See separate report from [facility]. Ascending Aorta: Normal size Pericardium: Normal Coronary arteries: Normal origin of left and right coronary arteries. Distribution of arterial calcifications if present, as noted below; LM 0 LAD 383 LCx 0 RCA 217 Total 601 IMPRESSION AND RECOMMENDATION: 1. Coronary calcium score of 601. This was 92nd percentile for age and sex matched control. 2. CAC >300 in LAD, RCA. CAC-DRS A3/N2. 3. Consider aspirin and statin if no contraindication. 4. Consider cardiology consultation. 5. Continue heart healthy lifestyle and risk factor modification. Electronically Signed: By: Kate Sable M.D. On: 10/05/2022 14:52    Microbiology: Recent Results (from the past 240 hour(s))  SARS Coronavirus 2 by RT PCR (hospital order, performed  in [place] lab) *cepheid  single result test* Anterior Nasal Swab     Status: None   Collection Time: 10/18/22  1:19 PM   Specimen: Anterior Nasal Swab  Result Value Ref Range Status   SARS Coronavirus 2 by RT PCR NEGATIVE NEGATIVE Final    Comment: (NOTE) SARS-CoV-2 target nucleic acids are NOT DETECTED.  The SARS-CoV-2 RNA is generally detectable in upper and lower respiratory specimens during the acute phase of infection. The lowest concentration of SARS-CoV-2 viral copies this assay can detect is 250 copies / mL. A negative result does not preclude SARS-CoV-2 infection and should not be used as the sole basis for treatment or other patient management decisions.  A negative result may occur with improper specimen collection / handling, submission of specimen other than nasopharyngeal swab, presence of viral mutation(s) within the areas targeted by this assay, and inadequate number of viral copies (<250 copies / mL). A negative result must be combined with clinical observations, patient history, and epidemiological information.  Fact Sheet for Patients:   https://www.patel.info/  Fact Sheet for Healthcare Providers: https://hall.com/  This test is not yet approved or  cleared by the [place] FDA and has been authorized for detection and/or diagnosis of SARS-CoV-2 by FDA under an Emergency Use Authorization (EUA).  This EUA will remain in effect (meaning this test can be used) for the duration of the COVID-19 declaration under Section 564(b)(1) of the Act, 21 U.S.C. section 360bbb-3(b)(1), unless the authorization is terminated or revoked sooner.  Performed at [facility], [address] [place], [place] [address]   Respiratory (~20 pathogens) panel by PCR     Status: Abnormal   Collection Time: 10/19/22 11:00 AM   Specimen: Nasopharyngeal Swab; Respiratory  Result Value Ref Range Status   Adenovirus NOT DETECTED NOT DETECTED Final    Coronavirus 229E NOT DETECTED NOT DETECTED Final    Comment: (NOTE) The Coronavirus on the Respiratory Panel, DOES NOT test for the novel  Coronavirus (2019 nCoV)    Coronavirus HKU1 DETECTED (A) NOT DETECTED Final   Coronavirus NL63 NOT DETECTED NOT DETECTED Final   Coronavirus OC43 NOT DETECTED NOT DETECTED Final   Metapneumovirus NOT DETECTED NOT DETECTED Final   Rhinovirus / Enterovirus NOT DETECTED NOT DETECTED Final   Influenza A H1 2009 DETECTED (A) NOT DETECTED Final   Influenza B NOT DETECTED NOT DETECTED Final   Parainfluenza Virus 1 NOT DETECTED NOT DETECTED Final   Parainfluenza Virus 2 NOT DETECTED NOT DETECTED Final   Parainfluenza Virus 3 NOT DETECTED NOT DETECTED Final   Parainfluenza Virus 4 NOT DETECTED NOT DETECTED Final   Respiratory Syncytial Virus NOT DETECTED NOT DETECTED Final   Bordetella pertussis NOT DETECTED NOT DETECTED Final   Bordetella Parapertussis NOT DETECTED NOT DETECTED Final   Chlamydophila pneumoniae NOT DETECTED NOT DETECTED Final   Mycoplasma pneumoniae NOT DETECTED NOT DETECTED Final    Comment: Performed at [facility], [place]. [address], [place], [place] [address]     Labs: Basic Metabolic Panel: Recent Labs  Lab 10/18/22 1232 10/19/22 0541 10/20/22 0625  NA 132* 134* 134*  K 3.7 4.2 3.6  CL 96* 99 101  CO2 27 27 25   GLUCOSE 84 71 86  BUN 10 9 10   CREATININE 1.08* 0.89 0.86  CALCIUM 9.0 8.8* 8.7*   Liver Function Tests: No results for input(s): "AST", "ALT", "ALKPHOS", "BILITOT", "PROT", "ALBUMIN" in the last 168 hours. No results for input(s): "LIPASE", "AMYLASE" in  the last 168 hours. No results for input(s): "AMMONIA" in the last 168 hours. CBC: Recent Labs  Lab 10/18/22 1232 10/19/22 0541 10/20/22 0625  WBC 8.6 6.5 5.7  HGB 12.2 11.7* 11.6*  HCT 38.1 35.5* 35.3*  MCV 87.0 85.5 85.3  PLT 291 238 232   Cardiac Enzymes: No results for input(s): "CKTOTAL", "CKMB", "CKMBINDEX", "TROPONINI" in the last 168  hours. BNP: BNP (last 3 results) Recent Labs    10/18/22 1232  BNP 114.6*    ProBNP (last 3 results) No results for input(s): "PROBNP" in the last 8760 hours.  CBG: No results for input(s): "GLUCAP" in the last 168 hours.     Signed:  Desma Maxim MD.  [facility] 10/20/2022, 9:59 AM

## 2022-10-20 NOTE — TOC Progression Note (Signed)
Transition of Care [facility]) - Progression Note    Patient Details  Name: Tammy Lamb MRN: [id number] Date of Birth: May 03, 1951  Transition of Care [facility]) CM/SW Contact  Laurena Slimmer, RN Phone Number: 10/20/2022, 10:50 AM  Clinical Narrative:    Discharge order received  Spoke with patient regarding therapy recommendation for [facility] PT/ OT. She is agreeable to [facility].  She would prefer [facility].  Referral sent and accepted by Amy TOC siging off.         Expected Discharge Plan and Services         Expected Discharge Date: 10/20/22                                     Social Determinants of Health (SDOH) Interventions SDOH Screenings   Food Insecurity: No Food Insecurity (10/18/2022)  Housing: Low Risk  (10/18/2022)  Transportation Needs: No Transportation Needs (10/18/2022)  Utilities: Not At Risk (10/18/2022)  Depression (PHQ2-9): Low Risk  (06/20/2022)  Tobacco Use: High Risk (10/18/2022)    Readmission Risk Interventions     No data to display

## 2022-10-20 NOTE — Progress Notes (Signed)
Physical Therapy Treatment Patient Details Name: Tammy Lamb MRN: [id number] DOB: 01/29/1951 Today's Date: 10/20/2022   History of Present Illness Tammy Lamb is a 72 y.o. female with medical history significant of CAD, hyperlipidemia, COPD, hypertension, hyperlipidemia, chronic pain, ulcerative colitis, who presents to the ED with complaints of chest pain. She went to be evaluated by her cardiologist on 10/18/22, who sent her emergently to the ED for catheterization due to concern for unstable angina. She is s/p L heart cath and angiography 10/18/22.    PT Comments    Patient reports feeling much better than yesterday. Independence with mobility and activity tolerance have also improved since yesterday. Mod I with bed mobility. Mild dizziness with sitting that does not worsen. She was able to stand with Min guard assistance. Hallway ambulation performed with rolling walker without significant shortness of breath. She reports feeling mildly fatigued during the walk. Recommend continued PT while hospitalized to maximize independence with PT follow up recommended at discharge. Anticipate the need for intermittent supervision/assistance at discharge.    Recommendations for follow up therapy are one component of a multi-disciplinary discharge planning process, led by the attending physician.  Recommendations may be updated based on patient status, additional functional criteria and insurance authorization.  Follow Up Recommendations       Assistance Recommended at Discharge Intermittent Supervision/Assistance  Patient can return home with the following Assist for transportation;Help with stairs or ramp for entrance;Assistance with cooking/housework;A little help with walking and/or transfers   Equipment Recommendations  None recommended by PT    Recommendations for Other Services       Precautions / Restrictions Precautions Precautions: Fall Restrictions Weight Bearing Restrictions: No      Mobility  Bed Mobility Overal bed mobility: Modified Independent                  Transfers Overall transfer level: Needs assistance Equipment used: Rolling walker (2 wheels) Transfers: Sit to/from Stand Sit to Stand: Min guard           General transfer comment: cues to avoid using RUE to push due to recent procedure. no lifting assistance required to stand    Ambulation/Gait Ambulation/Gait assistance: Min guard, Supervision Gait Distance (Feet): 60 Feet Assistive device: Rolling walker (2 wheels) Gait Pattern/deviations: Step-through pattern Gait velocity: decreased     General Gait Details: patient walked in the hallway with supervision. no loss of balance and no significant shortness of breath noted with activity. patient does report fatigue with ambulation.   Stairs             Wheelchair Mobility    Modified Rankin (Stroke Patients Only)       Balance Overall balance assessment: Needs assistance Sitting-balance support: Feet supported Sitting balance-Leahy Scale: Good Sitting balance - Comments: mild dizziness initially reported with sitting upright   Standing balance support: Bilateral upper extremity supported, During functional activity, Reliant on assistive device for balance Standing balance-Leahy Scale: Fair Standing balance comment: no external support from therapist. encouraged patient to use rolling walker for mobility                            Cognition Arousal/Alertness: Awake/alert Behavior During Therapy: WFL for tasks assessed/performed Overall Cognitive Status: Within Functional Limits for tasks assessed  Exercises      General Comments General comments (skin integrity, edema, etc.): encouraged short distance, routine ambulation using rolling walker for conditioning      Pertinent Vitals/Pain Pain Assessment Pain Assessment: No/denies pain     Home Living                          Prior Function            PT Goals (current goals can now be found in the care plan section) Acute Rehab PT Goals Patient Stated Goal: to feel better, go home PT Goal Formulation: With patient Time For Goal Achievement: 11/01/22 Potential to Achieve Goals: Fair Progress towards PT goals: Progressing toward goals    Frequency    Min 2X/week      PT Plan Discharge plan needs to be updated    Co-evaluation              AM-PAC PT "6 Clicks" Mobility   Outcome Measure  Help needed turning from your back to your side while in a flat bed without using bedrails?: None Help needed moving from lying on your back to sitting on the side of a flat bed without using bedrails?: A Little Help needed moving to and from a bed to a chair (including a wheelchair)?: A Little Help needed standing up from a chair using your arms (e.g., wheelchair or bedside chair)?: A Little Help needed to walk in hospital room?: A Little Help needed climbing 3-5 steps with a railing? : A Little 6 Click Score: 19    End of Session Equipment Utilized During Treatment: Gait belt Activity Tolerance: Patient tolerated treatment well Patient left: in bed;with call bell/phone within reach (seated on bed, she refused the bed alarm but encourged patient to have assistance before getting up) Nurse Communication: Mobility status (refused bed alarm- discussed with nurse tech) PT Visit Diagnosis: Unsteadiness on feet (R26.81);Other abnormalities of gait and mobility (R26.89);Difficulty in walking, not elsewhere classified (R26.2);Muscle weakness (generalized) (M62.81);History of falling (Z91.81);Pain;Dizziness and giddiness (R42)     Time: [id number] PT Time Calculation (min) (ACUTE ONLY): 27 min  Charges:  $Therapeutic Activity: 23-37 mins                     Minna Merritts, PT, MPT    Percell Locus 10/20/2022, 9:39 AM

## 2022-10-30 ENCOUNTER — Ambulatory Visit: Admission: RE | Admit: 2022-10-30 | Payer: Medicare Other | Source: Ambulatory Visit

## 2022-10-30 ENCOUNTER — Ambulatory Visit
Payer: Medicare Other | Attending: Student in an Organized Health Care Education/Training Program | Admitting: Student in an Organized Health Care Education/Training Program

## 2022-10-30 ENCOUNTER — Encounter: Payer: Self-pay | Admitting: Student in an Organized Health Care Education/Training Program

## 2022-10-30 VITALS — BP 135/90 | HR 68 | Temp 97.4°F | Ht 65.0 in | Wt 178.0 lb

## 2022-10-30 DIAGNOSIS — Z981 Arthrodesis status: Secondary | ICD-10-CM

## 2022-10-30 DIAGNOSIS — M5412 Radiculopathy, cervical region: Secondary | ICD-10-CM

## 2022-10-30 DIAGNOSIS — M47812 Spondylosis without myelopathy or radiculopathy, cervical region: Secondary | ICD-10-CM

## 2022-10-30 DIAGNOSIS — M542 Cervicalgia: Secondary | ICD-10-CM

## 2022-10-30 DIAGNOSIS — G8929 Other chronic pain: Secondary | ICD-10-CM

## 2022-10-30 MED ORDER — ROPIVACAINE HCL 2 MG/ML IJ SOLN: 1.0000 mL | Freq: Once | INTRAMUSCULAR | Status: DC

## 2022-10-30 MED ORDER — IOHEXOL 180 MG/ML  SOLN: 10.0000 mL | Freq: Once | INTRAMUSCULAR | Status: DC

## 2022-10-30 MED ORDER — SODIUM CHLORIDE 0.9% FLUSH: 1.0000 mL | Freq: Once | INTRAVENOUS | Status: DC

## 2022-10-30 MED ORDER — DEXAMETHASONE SODIUM PHOSPHATE 10 MG/ML IJ SOLN: 10.0000 mg | Freq: Once | INTRAMUSCULAR | Status: DC

## 2022-10-30 MED ORDER — LIDOCAINE HCL 2 % IJ SOLN: 20.0000 mL | Freq: Once | INTRAMUSCULAR | Status: DC

## 2022-10-30 NOTE — Progress Notes (Signed)
Safety precautions to be maintained throughout the outpatient stay will include: orient to surroundings, keep bed in low position, maintain call bell within reach at all times, provide assistance with transfer out of bed and ambulation.   Procedure cancelled due to pt being on Plavix and Baby Aspirin; will reschedule for the fall per Dr. Cherylann Ratel

## 2022-10-30 NOTE — Progress Notes (Signed)
Patient had a coronary stent placed last week and is on Plavix.  We will have to postpone her cervical epidural steroid injection until she is able to stop her Plavix for 7 days prior to her procedure.  I informed her that this may be after 6 months but she needs to discuss with her cardiologist.

## 2022-10-31 ENCOUNTER — Encounter: Payer: Self-pay | Admitting: Nurse Practitioner

## 2022-10-31 NOTE — Progress Notes (Signed)
Cardiology Office Note:    Date:  10/31/2022   ID:  Tammy Lamb, DOB 07/10/1951, MRN [id number]  PCP:  Enid BaasKalisetti, Radhika, MD   [facility] Providers Cardiologist:  Debbe OdeaBrian Agbor-Etang, MD { Click to update primary MD,subspecialty MD or APP then REFRESH:1}    Referring MD: Enid BaasKalisetti, Radhika, MD   No chief complaint on file. ***  History of Present Illness:    Tammy Lamb is a 72 y.o. female with a hx of CAD (s/p PCI/DES to RCA 10/18/22), DM2, PVD, hypertension, bilateral carotid artery stenosis, HLD, COPD, ulcerative colitis, DDD, history of recurrent UTIs, TIA in 2015, CVA in 2015, chronic pain syndrome, tobacco use.  Previously established with Dr. Gwen PoundsKowalski and last evaluated by him in October of 2022. Carotid US in 03/2021 showed bilateral carotid artery stenosis. ABIs in 03/2021 were within normal limits. Echocardiogram in 03/2021 showed normal LVEF, mild MR, mild TR, mild RV enlargement, mild biatrial enlargement.  [facility] to establish care with Dr. Azucena CecilAgbor-Etang after having a calcium score that was elevated at 601 on 10/05/2022. She complained of chest pain that had been occurring on and off for the previous 3 weeks, most often awakening her at night.  She was sent to the ED for further evaluation and possible PCI.  She underwent LHC on 10/18/2022 which showed 85% stenosis of the RCA, DES was placed, with 0% residual stenosis.  Echo completed on 10/19/2022 showed an EF of 60 to 65%, grade 1 DD, aortic valve sclerosis without stenosis.  She tested positive for influenza and coronavirus, initially SNF was recommended however on hospital day 2 she was feeling significantly better and was able to discharge home with home health services as well as cardiac rehab recommendations.  Continue DAPT without interruption with aspirin and clopidogrel for at least 12 months   Past Medical History:  Diagnosis Date   Anemia    h/o   Anxiety    Arthritis     Bilateral carotid artery stenosis    CAD (coronary artery disease), native coronary artery 10/18/2022   DES to RCA   Chronic lower back pain    Colitis, ulcerative    COPD (chronic obstructive pulmonary disease)    Depression    Diabetes mellitus without complication    GERD (gastroesophageal reflux disease)    H/O bladder infections    History of 2019 novel coronavirus disease (COVID-19) 02/2021   History of hiatal hernia    HLD (hyperlipidemia)    Hypertension    Interstitial cystitis    Low sodium levels    Motion sickness    fair rides   Parkinson's disease    [facility]   PVD (peripheral vascular disease)    Sinus headache    Stroke 2015   mild stroke d/t coreg dose being too high and pt. had severe hypotension.   TIA (transient ischemic attack) 2015   no deficits.  per pt due to low BP.   Urinary frequency    Wears dentures    partial upper and lower    Past Surgical History:  Procedure Laterality Date   ABDOMINAL HYSTERECTOMY     BSO   ANKLE ARTHROSCOPY Left 03/22/2016   Procedure: ANKLE ARTHROSCOPY DEBRODEMENT EXTENSIVE LEFT FLEXAR TENDON REPAIR;  Surgeon: Gwyneth RevelsJustin Fowler, DPM;  [facility];  Service: Podiatry;  Laterality: Left;  WITH POPLITEAL   ANTERIOR CERVICAL DECOMP/DISCECTOMY FUSION N/A 04/09/2019   Procedure: ANTERIOR CERVICAL  DECOMPRESSION/DISCECTOMY FUSION 1 LEVEL C3-4;  Surgeon: Venetia Night, MD;  Location: [facility];  Service: Neurosurgery;  Laterality: N/A;   BLADDER SUSPENSION     tac   CHOLECYSTECTOMY     COLONOSCOPY     CORONARY STENT INTERVENTION N/A 10/18/2022   Procedure: CORONARY STENT INTERVENTION;  Surgeon: Iran Ouch, MD;  Location: [facility];  Service: Cardiovascular;  Laterality: N/A;   DIAGNOSTIC LAPAROSCOPY     adhesions   KNEE ARTHROSCOPY Left 02/13/2014   Procedure: LEFT ARTHROSCOPY KNEE, PARTIAL MEDIAL MENISECTOMY AND PLICA, CHONDROPLASTY OF PATELLA FEMORAL JOINT;  Surgeon:  Harvie Junior, MD;  Location: [facility];  Service: Orthopedics;  Laterality: Left;   LEFT HEART CATH AND CORONARY ANGIOGRAPHY N/A 10/18/2022   Procedure: LEFT HEART CATH AND CORONARY ANGIOGRAPHY;  Surgeon: Iran Ouch, MD;  Location: [facility];  Service: Cardiovascular;  Laterality: N/A;   LUMBAR LAMINECTOMY/DECOMPRESSION MICRODISCECTOMY N/A 05/30/2017   Procedure: LUMBAR LAMINECTOMY/DECOMPRESSION MICRODISCECTOMY 3 LEVELS-L3-S1;  Surgeon: Venetia Night, MD;  Location: [facility];  Service: Neurosurgery;  Laterality: N/A;   LUMBAR LAMINECTOMY/DECOMPRESSION MICRODISCECTOMY N/A 05/27/2021   Procedure: L4-S1 DECOMPRESSION;  Surgeon: Venetia Night, MD;  Location: [facility];  Service: Neurosurgery;  Laterality: N/A;   TENDON REPAIR Left 03/22/2016   Procedure: KWIOXB TENDON REPAIR;  Surgeon: Gwyneth Revels, DPM;  Location: [facility];  Service: Podiatry;  Laterality: Left;   TONSILLECTOMY     UPPER GI ENDOSCOPY      Current Medications: No outpatient medications have been marked as taking for the 11/01/22 encounter (Appointment) with Creig Hines, NP.     Allergies:   Aspirin and Sulfa antibiotics   Social History   Socioeconomic History   Marital status: Married    Spouse name: Not on file   Number of children: Not on file   Years of education: Not on file   Highest education level: Not on file  Occupational History   Not on file  Tobacco Use   Smoking status: Every Day    Packs/day: 0.50    Years: 49.00    Additional pack years: 0.00    Total pack years: 24.50    Types: Cigarettes   Smokeless tobacco: Never   Tobacco comments:    1 pack every 3-4 days 10-18-22  Vaping Use   Vaping Use: Never used  Substance and Sexual Activity   Alcohol use: Not Currently    Comment: Occassional,   Drug use: No   Sexual activity: Never  Other Topics Concern   Not on file  Social History Narrative   Not on file   Social Determinants  of Health   Financial Resource Strain: Not on file  Food Insecurity: No Food Insecurity (10/18/2022)   Hunger Vital Sign    Worried About Running Out of Food in the Last Year: Never true    Ran Out of Food in the Last Year: Never true  Transportation Needs: No Transportation Needs (10/18/2022)   PRAPARE - Administrator, Civil Service (Medical): No    Lack of Transportation (Non-Medical): No  Physical Activity: Not on file  Stress: Not on file  Social Connections: Not on file     Family History: The patient's ***family history includes COPD in her father; Diabetes in her mother; Heart disease in her father; Hypertension in her father and mother. There is no history of Breast cancer.  ROS:   Please see the history of present illness.    ***  All other systems reviewed and are negative.  EKGs/Labs/Other Studies Reviewed:    The following studies were reviewed today: ***  EKG:  EKG is *** ordered today.  The ekg ordered today demonstrates ***  Recent Labs: 10/18/2022: B Natriuretic Peptide 114.6; TSH 3.245 10/20/2022: BUN 10; Creatinine, Ser 0.86; Hemoglobin 11.6; Platelets 232; Potassium 3.6; Sodium 134  Recent Lipid Panel    Component Value Date/Time   CHOL 158 10/19/2022 0541   TRIG 56 10/19/2022 0541   HDL 84 10/19/2022 0541   CHOLHDL 1.9 10/19/2022 0541   VLDL 11 10/19/2022 0541   LDLCALC 63 10/19/2022 0541     Risk Assessment/Calculations:   {Does this patient have ATRIAL FIBRILLATION?[phone]}            Physical Exam:    VS:  There were no vitals taken for this visit.    Wt Readings from Last 3 Encounters:  10/30/22 178 lb (80.7 kg)  10/20/22 186 lb 11.7 oz (84.7 kg)  10/18/22 190 lb 12.8 oz (86.5 kg)     GEN: *** Well nourished, well developed in no acute distress HEENT: Normal NECK: No JVD; No carotid bruits LYMPHATICS: No lymphadenopathy CARDIAC: ***RRR, no murmurs, rubs, gallops RESPIRATORY:  Clear to auscultation without rales,  wheezing or rhonchi  ABDOMEN: Soft, non-tender, non-distended MUSCULOSKELETAL:  No edema; No deformity  SKIN: Warm and dry NEUROLOGIC:  Alert and oriented x 3 PSYCHIATRIC:  Normal affect   ASSESSMENT:    1. Coronary artery disease involving native coronary artery of native heart, unspecified whether angina present   2. Primary hypertension   3. Smoking    PLAN:    In order of problems listed above:  ***  {The patient has an active order for outpatient cardiac rehabilitation.   Please indicate if the patient is ready to start. Do NOT delete this.  It will auto delete.  Refresh note, then sign.              Click here to document readiness and see contraindications.  :1}  Cardiac Rehabilitation Eligibility Assessment       {Are you ordering a CV Procedure (e.g. stress test, cath, DCCV, TEE, etc)?   Press F2        :[id number]}    Medication Adjustments/Labs and Tests Ordered: Current medicines are reviewed at length with the patient today.  Concerns regarding medicines are outlined above.  No orders of the defined types were placed in this encounter.  No orders of the defined types were placed in this encounter.   There are no Patient Instructions on file for this visit.   Signed, Flossie Dibble, NP  10/31/2022 3:46 PM    [facility]

## 2022-11-01 ENCOUNTER — Ambulatory Visit: Payer: Medicare Other | Attending: Nurse Practitioner | Admitting: Cardiology

## 2022-11-01 ENCOUNTER — Encounter: Payer: Self-pay | Admitting: Nurse Practitioner

## 2022-11-01 VITALS — BP 136/84 | HR 57 | Ht 65.5 in | Wt 179.0 lb

## 2022-11-01 DIAGNOSIS — I6523 Occlusion and stenosis of bilateral carotid arteries: Secondary | ICD-10-CM

## 2022-11-01 DIAGNOSIS — I10 Essential (primary) hypertension: Secondary | ICD-10-CM | POA: Diagnosis not present

## 2022-11-01 DIAGNOSIS — E782 Mixed hyperlipidemia: Secondary | ICD-10-CM | POA: Diagnosis present

## 2022-11-01 DIAGNOSIS — I2510 Atherosclerotic heart disease of native coronary artery without angina pectoris: Secondary | ICD-10-CM | POA: Diagnosis not present

## 2022-11-01 DIAGNOSIS — IMO0001 Reserved for inherently not codable concepts without codable children: Secondary | ICD-10-CM

## 2022-11-01 DIAGNOSIS — F17200 Nicotine dependence, unspecified, uncomplicated: Secondary | ICD-10-CM

## 2022-11-01 DIAGNOSIS — R7303 Prediabetes: Secondary | ICD-10-CM | POA: Diagnosis present

## 2022-11-01 DIAGNOSIS — Z794 Long term (current) use of insulin: Secondary | ICD-10-CM

## 2022-11-01 MED ORDER — NITROGLYCERIN 0.4 MG SL SUBL: 0.4000 mg | SUBLINGUAL_TABLET | SUBLINGUAL | 3 refills | Status: AC | PRN

## 2022-11-01 NOTE — Patient Instructions (Signed)
Medication Instructions:  Your physician has recommended you make the following change in your medication:   nitroGLYCERIN (NITROSTAT) 0.4 MG SL tablet - Place 1 tablet (0.4 mg total) under the tongue every 5 (five) minutes as needed for chest pain.   *If you need a refill on your cardiac medications before your next appointment, please call your pharmacy*  Lab Work: -None ordered If you have labs (blood work) drawn today and your tests are completely normal, you will receive your results only by: MyChart Message (if you have MyChart) OR A paper copy in the mail If you have any lab test that is abnormal or we need to change your treatment, we will call you to review the results.  Testing/Procedures: -None ordered  Follow-Up: At [facility], you and your health needs are our priority.  As part of our continuing mission to provide you with exceptional heart care, we have created designated Provider Care Teams.  These Care Teams include your primary Cardiologist (physician) and Advanced Practice Providers (APPs -  Physician Assistants and Nurse Practitioners) who all work together to provide you with the care you need, when you need it.  We recommend signing up for the patient portal called "MyChart".  Sign up information is provided on this After Visit Summary.  MyChart is used to connect with patients for Virtual Visits (Telemedicine).  Patients are able to view lab/test results, encounter notes, upcoming appointments, etc.  Non-urgent messages can be sent to your provider as well.   To learn more about what you can do with MyChart, go to [email or web address].    Your next appointment:   2 - 3 month(s)  Provider:   You may see Debbe Odea, MD or one of the following Advanced Practice Providers on your designated Care Team:   Nicolasa Ducking, NP Eula Listen, PA-C Cadence Fransico Michael, PA-C Charlsie Quest, NP    Other Instructions -None

## 2022-11-02 ENCOUNTER — Telehealth: Payer: Self-pay | Admitting: Cardiology

## 2022-11-02 NOTE — Telephone Encounter (Signed)
Preop team,   Will you please reach out to requesting office to clarify if patient is pending a procedure or are they asking for general clearance.   Thank you!  DW

## 2022-11-02 NOTE — Telephone Encounter (Signed)
   Pre-operative Risk Assessment    Patient Name: Tammy Lamb  DOB: 1951/04/11 MRN: [id number]     Request for Surgical Clearance    Procedure:   Crown  Date of Surgery:  Clearance TBD                                 Surgeon:  Dr. Kassie Mends   Surgeon's Group or Practice Name:  [facility] Phone number:  [phone] Fax number:  [phone]   Type of Clearance Requested:   - Medical - Office would like to know when pt would be ok to receive cleanings, crowns, and extractions   Type of Anesthesia:  None    Additional requests/questions:    Milinda Pointer   11/02/2022, 3:09 PM

## 2022-11-03 NOTE — Telephone Encounter (Signed)
Spoke with the Dentist offfice who states that patient needs a pending clearance on 1 crown tooth # 22.

## 2022-11-03 NOTE — Telephone Encounter (Signed)
   Patient Name: Tammy Lamb  DOB: [birth year] MRN: [id number]  Primary Cardiologist: Debbe Odea, MD  Chart reviewed as part of pre-operative protocol coverage.   Simple dental procedures of 1-2 teeth are considered low risk procedures per guidelines and generally do not require any specific cardiac clearance. It is also generally accepted that for simple dental procedures of 1-2 teeth and dental cleanings, there is no need to interrupt blood thinner therapy. Patient recently underwent PCI with DES in March 2024 and should not interrupt Plavix and aspirin for at least 12 months.   SBE prophylaxis is not required for the patient from a cardiac standpoint.  I will route this recommendation to the requesting party via Epic fax function and remove from pre-op pool.  Please call with questions.  Carlos Levering, NP 11/03/2022, 11:46 AM

## 2022-11-06 ENCOUNTER — Telehealth: Payer: Self-pay | Admitting: *Deleted

## 2022-11-06 NOTE — Telephone Encounter (Signed)
PT has a CT scan sched 4/17. She had one done in March. Does she really need another? Pls call her @ [phone]

## 2022-11-07 NOTE — Telephone Encounter (Signed)
Spoke with patient after reviewing the patient's recent Cardiac scoring CT with Kandice Robinsons NP.  Advised this CT did not give enough detail of the lung nodules and we still recommend the follow up in 6 months per Dr. Myrlene Broker review.  Patient states she had cardiac stent and also had recent flu and covid and she is going to cardiac rehab and trying to get her strength back. She feels she needs to wait a couple of months.  We will plan to call her again around June to schedule in June or July.  Johny Drilling [facility] will place order in new month for follow up.  Also a letter has been mailed to home reminding patient to call us if she has not heard from [place] for scheduling.  Patient acknowledged understanding.

## 2022-11-07 NOTE — Telephone Encounter (Signed)
PT calling again for an answer regarding CT. Setting as High Priority due to time crunch. Pls call to advise. TY  [phone]

## 2022-11-08 ENCOUNTER — Ambulatory Visit: Payer: Medicare Other

## 2022-11-16 ENCOUNTER — Other Ambulatory Visit: Payer: Self-pay

## 2022-11-16 ENCOUNTER — Ambulatory Visit
Admission: RE | Admit: 2022-11-16 | Discharge: 2022-11-16 | Disposition: A | Discharge status: Home / Self Care | Payer: Medicare Other | Source: Ambulatory Visit | Attending: Physician Assistant | Admitting: Physician Assistant

## 2022-11-16 DIAGNOSIS — I951 Orthostatic hypotension: Secondary | ICD-10-CM | POA: Diagnosis present

## 2022-11-16 DIAGNOSIS — R410 Disorientation, unspecified: Secondary | ICD-10-CM | POA: Insufficient documentation

## 2022-11-16 DIAGNOSIS — R4182 Altered mental status, unspecified: Secondary | ICD-10-CM | POA: Diagnosis present

## 2022-11-16 DIAGNOSIS — R296 Repeated falls: Secondary | ICD-10-CM | POA: Insufficient documentation

## 2022-11-16 DIAGNOSIS — S0990XA Unspecified injury of head, initial encounter: Secondary | ICD-10-CM | POA: Diagnosis present

## 2022-11-16 DIAGNOSIS — R443 Hallucinations, unspecified: Secondary | ICD-10-CM | POA: Insufficient documentation

## 2022-11-20 ENCOUNTER — Telehealth: Payer: Self-pay | Admitting: Cardiology

## 2022-11-20 NOTE — Telephone Encounter (Signed)
Pt c/o medication issue:  1. Name of Medication: aspirin EC 81 MG tablet   2. How are you currently taking this medication (dosage and times per day)?   3. Are you having a reaction (difficulty breathing--STAT)?   4. What is your medication issue? Pt states that since her heart stent, she's been on asprin and plavix. She states she can't do the asprin any longer. She states she has taken it with and without food, but it makes her stomach hurt.

## 2022-11-20 NOTE — Telephone Encounter (Signed)
To Dr. [name] to review. Patient is s/p cath on 10/18/22 with PCI.  Recommendations: Dual antiplatelet therapy for at least 12 months. Aggressive treatment of risk factors.

## 2022-11-27 MED ORDER — PANTOPRAZOLE SODIUM 40 MG PO TBEC: 40.0000 mg | DELAYED_RELEASE_TABLET | Freq: Every day | ORAL | 3 refills | Status: DC

## 2022-11-27 NOTE — Telephone Encounter (Signed)
Spoke with patient.  Reviewed Dr. Amaryllis Dyke recommends as followed:  Start patient on Protonix 40 mg daily to help with symptoms of gastric irritation.  Due to recently placed stent, dual antiplatelet therapy is typically recommended for at least 3 months.   Patient is agreeable.

## 2022-12-15 ENCOUNTER — Other Ambulatory Visit: Payer: Self-pay | Admitting: Obstetrics and Gynecology

## 2022-12-21 ENCOUNTER — Telehealth: Payer: Self-pay | Admitting: Cardiology

## 2022-12-21 MED ORDER — CLOPIDOGREL BISULFATE 75 MG PO TABS: 75.0000 mg | ORAL_TABLET | Freq: Every day | ORAL | 11 refills | Status: DC

## 2022-12-21 NOTE — Telephone Encounter (Signed)
Pt c/o medication issue:  1. Name of Medication: clopidogrel (PLAVIX) 75 MG tablet   2. How are you currently taking this medication (dosage and times per day)? As prescribed   3. Are you having a reaction (difficulty breathing--STAT)?   4. What is your medication issue? Patient would like this medication to be filled by her cardiologist. Only has 4 pills left.  30 day supply.   Send to:   [id number] - [place], [place] - [address] AT [facility]

## 2022-12-21 NOTE — Telephone Encounter (Signed)
Reviewed the patient's chart.  She is s/p cath on 10/18/22: 1.  Severe one-vessel coronary artery disease with 85% hazy eccentric stenosis in the proximal right coronary artery which is the likely culprit for unstable angina.  The LAD is mildly calcified but no significant stenosis. 2.  Normal LV systolic function and mildly elevated left ventricular end-diastolic pressure. 3.  Successful drug-eluting stent placement to the proximal right coronary artery.   Recommendations: Dual antiplatelet therapy for at least 12 months. Aggressive treatment of risk factors. Likely discharge home tomorrow if she remains stable.   Will refill Plavix as requested.   I have called and notified the patient.

## 2022-12-26 ENCOUNTER — Ambulatory Visit
Payer: Medicare Other | Attending: Student in an Organized Health Care Education/Training Program | Admitting: Student in an Organized Health Care Education/Training Program

## 2022-12-26 ENCOUNTER — Encounter: Payer: Self-pay | Admitting: Student in an Organized Health Care Education/Training Program

## 2022-12-26 DIAGNOSIS — G8929 Other chronic pain: Secondary | ICD-10-CM | POA: Insufficient documentation

## 2022-12-26 DIAGNOSIS — M5412 Radiculopathy, cervical region: Secondary | ICD-10-CM | POA: Diagnosis present

## 2022-12-26 DIAGNOSIS — M5030 Other cervical disc degeneration, unspecified cervical region: Secondary | ICD-10-CM | POA: Insufficient documentation

## 2022-12-26 DIAGNOSIS — M5124 Other intervertebral disc displacement, thoracic region: Secondary | ICD-10-CM | POA: Insufficient documentation

## 2022-12-26 DIAGNOSIS — G894 Chronic pain syndrome: Secondary | ICD-10-CM | POA: Insufficient documentation

## 2022-12-26 DIAGNOSIS — M5414 Radiculopathy, thoracic region: Secondary | ICD-10-CM | POA: Insufficient documentation

## 2022-12-26 DIAGNOSIS — M4692 Unspecified inflammatory spondylopathy, cervical region: Secondary | ICD-10-CM | POA: Insufficient documentation

## 2022-12-26 MED ORDER — HYDROCODONE-ACETAMINOPHEN 7.5-325 MG PO TABS: 1.0000 | ORAL_TABLET | Freq: Three times a day (TID) | ORAL | 0 refills | Status: DC | PRN

## 2022-12-26 MED ORDER — GABAPENTIN 300 MG PO CAPS: 900.0000 mg | ORAL_CAPSULE | Freq: Two times a day (BID) | ORAL | 5 refills | Status: DC

## 2022-12-26 MED ORDER — TIZANIDINE HCL 4 MG PO TABS: 4.0000 mg | ORAL_TABLET | Freq: Two times a day (BID) | ORAL | 5 refills | Status: DC | PRN

## 2022-12-26 NOTE — Progress Notes (Signed)
Nursing Pain Medication Assessment:  Safety precautions to be maintained throughout the outpatient stay will include: orient to surroundings, keep bed in low position, maintain call bell within reach at all times, provide assistance with transfer out of bed and ambulation.  Medication Inspection Compliance: Tammy Lamb did not comply with our request to bring her pills to be counted. She was reminded that bringing the medication bottles, even when empty, is a requirement.  Medication: None brought in. Pill/Patch Count: None available to be counted. Bottle Appearance: No container available. Did not bring bottle(s) to appointment. Filled Date: N/A Last Medication intake:  TodaySafety precautions to be maintained throughout the outpatient stay will include: orient to surroundings, keep bed in low position, maintain call bell within reach at all times, provide assistance with transfer out of bed and ambulation.

## 2022-12-26 NOTE — Progress Notes (Signed)
PROVIDER NOTE: Information contained herein reflects review and annotations entered in association with encounter. Interpretation of such information and data should be left to medically-trained personnel. Information provided to patient can be located elsewhere in the medical record under "Patient Instructions". Document created using STT-dictation technology, any transcriptional errors that may result from process are unintentional.    Patient: Tammy Lamb  Service Category: E/M  Provider: Edward Jolly, MD  DOB: 1950-11-12  DOS: 12/26/2022  Specialty: Interventional Pain Management  MRN: [id number]  Setting: Ambulatory outpatient  PCP: Enid Baas, MD  Type: Established Patient    Referring Provider: Enid Baas, MD  Location: Office  Delivery: Face-to-face     HPI  [name], a 72 y.o. year old female, is here today because of her No primary diagnosis found.. Ms. Samad's primary complain today is Back Pain (lower) Last encounter: My last encounter with her was on 09/28/22 Pertinent problems: Ms. 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 Pain Assessment: Severity of Chronic pain is reported as a 8 /10. Location: Back Left, Right/pain radiaties down both hip to her leg. Onset: More than a month ago. Quality: Aching, Burning, Throbbing, Shooting, Sharp. Timing: Constant. Modifying factor(s): Meds and laying down. Vitals:  height is 5\' 6"  (1.676 m) and weight is 183 lb (83 kg). Her temperature is 97.3 F (36.3  C) (abnormal). Her blood pressure is 152/69 (abnormal) and her pulse is 52 (abnormal). Her oxygen saturation is 99%.   Reason for encounter: medication management.   Patient presents today for medication management.  No significant change in her medical history.  She remains on Plavix after her coronary stent was placed in March 2024.  We can consider spinal interventions in about another 3 months which will be 6 months of Plavix since her coronary stent placement.  She is also complaining of cervical spine pain with radiation into her left shoulder related to cervical radiculopathy in the context of having a C3-C4 ACDF.  We discussed a left cervical epidural steroid injection in the fall.   Refill of hydrocodone, tizanidine, gabapentin as below, no change in dose.   Pharmacotherapy Assessment  Analgesic: Hydrocodone 7.5mg  TID prn 90/month     Monitoring: [place] PMP: PDMP reviewed during this encounter.       Pharmacotherapy: No side-effects or adverse reactions reported. Compliance: No problems identified. Effectiveness: Clinically acceptable.  Brigitte Pulse, RN  12/26/2022  9:27 AM  Sign when Signing Visit Nursing Pain Medication Assessment:  Safety precautions to be maintained throughout the outpatient stay will include: orient to surroundings, keep bed in low position, maintain call bell within reach at all times, provide assistance with transfer out of bed and ambulation.  Medication Inspection Compliance: Ms. Hellwege did not comply with our request to bring her pills to be counted. She was reminded that bringing the medication bottles, even when empty, is a requirement.  Medication: None brought in. Pill/Patch Count: None available to be counted. Bottle Appearance: No container available. Did not bring bottle(s) to appointment. Filled Date: N/A Last Medication intake:  TodaySafety precautions to be maintained throughout the outpatient stay will include: orient to surroundings, keep bed  in low position, maintain call bell within reach at all times, provide assistance with transfer out of bed and ambulation.      UDS:  Summary  Date Value Ref Range Status  09/28/2022 Note  Final    Comment:    ==================================================================== [employer] [facility] (MW) ==================================================================== Test                             Result       Flag       Units  Drug Present and Declared for Prescription Verification   Hydrocodone                    280          EXPECTED   ng/mg creat   Norhydrocodone                 979          EXPECTED   ng/mg creat    Sources of hydrocodone include scheduled prescription medications.    Norhydrocodone is an expected metabolite of hydrocodone.  ==================================================================== Test                      Result    Flag   Units      Ref Range   Creatinine              61               mg/dL      >=16 ==================================================================== Declared Medications:  The flagging and interpretation on this report are based on the  following declared medications.  Unexpected results may arise from  inaccuracies in the declared medications.   **Note: The testing scope of this panel includes these medications:   Hydrocodone (Norco)   **Note: The testing scope of this panel does not include the  following reported medications:   Acetaminophen (Tylenol)  Acetaminophen (Norco)  Albuterol (Ventolin HFA)  Amlodipine (Norvasc)  Atorvastatin (Lipitor)  Cephalexin (Keflex)  Cinnamon  Citalopram (Celexa)  Clonidine (Catapres)  Duloxetine (Cymbalta)  Fish Oil  Fluticasone (Advair)  Gabapentin (Neurontin)  Hydrochlorothiazide (Hydrodiuril)  Hydroxyzine (Atarax)  Magnesium (Mag-Ox)  Omeprazole  Oxybutynin (Ditropan)  Ramipril (Altace)  Salmeterol (Advair)  Sodium Bicarbonate  Sulfasalazine (Azulfidine)   Tizanidine (Zanaflex)  Valsartan (Diovan)  Vitamin B12  Vitamin C  Vitamin D3 ==================================================================== For clinical consultation, please call [phone]. ====================================================================      ROS  Constitutional: Denies any fever or chills Gastrointestinal: No reported hemesis, hematochezia, vomiting, or acute GI distress Musculoskeletal: Left cervical spine pain with radiation into left shoulder; low back, left hip,  Neurological: No reported episodes of acute onset apraxia, aphasia, dysarthria, agnosia, amnesia, paralysis, loss of coordination, or loss of consciousness  Medication Review  Cinnamon, DULoxetine, Fish Oil, HYDROcodone-acetaminophen, acetaminophen, albuterol, amLODipine, ascorbic acid, aspirin EC, atorvastatin, buPROPion, carbidopa-levodopa, cephALEXin, cholecalciferol, citalopram, cloNIDine, clopidogrel, cyanocobalamin, fluticasone-salmeterol, gabapentin, hydrOXYzine, magnesium oxide, metoprolol tartrate, nitroGLYCERIN, oseltamivir, oxybutynin, pantoprazole, pentosan polysulfate, predniSONE, propranolol, sulfaSALAzine, tiZANidine, and valsartan  History Review  Allergy: Ms. Niemeyer is allergic to aspirin and sulfa antibiotics. Drug: Ms. Lindwall  reports no history of drug use. Alcohol:  reports that she does not currently use alcohol. Tobacco:  reports that she has quit smoking. Her smoking use included cigarettes. She has a 24.50 pack-year smoking history. She has never used smokeless tobacco. Social: Ms. Bolinsky  reports that she has quit smoking. Her  smoking use included cigarettes. She has a 24.50 pack-year smoking history. She has never used smokeless tobacco. She reports that she does not currently use alcohol. She reports that she does not use drugs. Medical:  has a past medical history of Anemia, Anxiety, Arthritis, Bilateral carotid artery stenosis, CAD (coronary artery disease), native  coronary artery (10/18/2022), Chronic lower back pain, Colitis, ulcerative (HCC), COPD (chronic obstructive pulmonary disease) (HCC), Depression, Diabetes mellitus without complication (HCC), GERD (gastroesophageal reflux disease), H/O bladder infections, History of 2019 novel coronavirus disease (COVID-19) (02/2021), History of hiatal hernia, HLD (hyperlipidemia), Hypertension, Interstitial cystitis, Low sodium levels, Motion sickness, Parkinson's disease, PVD (peripheral vascular disease) (HCC), Sinus headache, Stroke (HCC) (2015), TIA (transient ischemic attack) (2015), Urinary frequency, and Wears dentures. Surgical: Ms. Tolson  has a past surgical history that includes Tonsillectomy; Cholecystectomy; Abdominal hysterectomy; Diagnostic laparoscopy; Bladder suspension; Colonoscopy; Upper gi endoscopy; Knee arthroscopy (Left, 02/13/2014); Ankle arthroscopy (Left, 03/22/2016); Tendon repair (Left, 03/22/2016); Lumbar laminectomy/decompression microdiscectomy (N/A, 05/30/2017); Anterior cervical decomp/discectomy fusion (N/A, 04/09/2019); Lumbar laminectomy/decompression microdiscectomy (N/A, 05/27/2021); LEFT HEART CATH AND CORONARY ANGIOGRAPHY (N/A, 10/18/2022); and CORONARY STENT INTERVENTION (N/A, 10/18/2022). Family: family history includes COPD in her father; Diabetes in her mother; Heart disease in her father; Hypertension in her father and mother.  Imaging review: LUMBAR SPINE - COMPLETE WITH BENDING VIEWS   COMPARISON:  MRI lumbar spine 08/29/2017   FINDINGS: There are 5 non-rib-bearing lumbar-type vertebral bodies. Mild dextrocurvature centered at L4-5 with severe left L4-5 disc space narrowing seen on frontal view. Vertebral body heights are maintained.   Normal sagittal alignment. Mild-to-moderate T9-10 and T10-11, mild T11-12, mild-to-moderate T12-L1, mild L1-2, mild posterior L2-3 through L4-5, and moderate posterior and mild anterior L5-S1 disc space narrowing.   Facet joint arthropathy is  greatest at L5-S1.   Cholecystectomy clips.   IMPRESSION: 1. Normal alignment. 2. Vertebral body heights are maintained. 3. Multilevel degenerative disc changes, greatest at L5-S1 and the left L4-5 level.  CLINICAL DATA:  Right knee pain/arthralgia. Chronic pain. Worse after falling 11/27/2021.   EXAM: RIGHT KNEE - 1-2 VIEW   COMPARISON:  None Available.   FINDINGS: Minimal superior patellar degenerative osteophytosis. No significant joint space narrowing. Mildly decreased bone mineralization. No joint effusion. Minimal chronic enthesopathic change at the quadriceps insertion on the patella. No acute fracture or dislocation.   IMPRESSION: 1. Minimal superior patellar degenerative osteophyte. 2. No acute fracture.  Narrative & Impression  CLINICAL DATA:  Chronic knee pain.  Worse after fall 11/27/2021.   EXAM: LEFT KNEE - 1-2 VIEW   COMPARISON:  None Available.   FINDINGS: Mild medial compartment joint space narrowing and peripheral degenerative osteophytes. Minimal superior patellar degenerative osteophytosis. Minimal chronic enthesopathic change at the quadriceps insertion on the patella. No joint effusion. No acute fracture or dislocation. Mildly decreased bone mineralization.   IMPRESSION: Mild medial and minimal patellofemoral osteoarthritis. No acute fracture.    CLINICAL DATA:  Right knee pain/arthralgia. History of chronic pain. Worse after falling 11/27/2021.   EXAM: DG HIP (WITH OR WITHOUT PELVIS) 2-3V RIGHT; DG HIP (WITH OR WITHOUT PELVIS) 2-3V LEFT   COMPARISON:  None Available.   FINDINGS: There is diffuse decreased bone mineralization. Mild bilateral superior femoroacetabular joint space narrowing. Mild bilateral superolateral acetabular degenerative osteophytosis. Mild pubic symphysis joint space narrowing and subchondral sclerosis degenerative change. Mild bilateral inferior sacroiliac joint space narrowing and subchondral sclerosis.    Minimal right-greater-than-left convexity of the anterior superior femoral head-neck junctions.   IMPRESSION: 1. Mild bilateral femoroacetabular, mild bilateral  sacroiliac, and mild pubic symphysis osteoarthritis. 2. Minimal right-greater-than-left convexity of the anterior superior femoral head-neck junctions, a CAM-type bump morphology that mildly increases propensity for CAM-type femoroacetabular impingement.  Narrative & Impression  CLINICAL DATA:  72 year old female status post fusion in September last year. Right side neck pain. Left arm numbness and tingling.   EXAM: CT CERVICAL SPINE WITHOUT CONTRAST   TECHNIQUE: Multidetector CT imaging of the cervical spine was performed without intravenous contrast. Multiplanar CT image reconstructions were also generated.   COMPARISON:  Postoperative cervical spine MRI 10/10/2019.   FINDINGS: Alignment: Stable cervical lordosis from the March MRI. Cervicothoracic junction alignment is within normal limits. Bilateral posterior element alignment is within normal limits.   Skull base and vertebrae: Postoperative details below. Visualized skull base is intact. No atlanto-occipital dissociation. No acute osseous abnormality identified.   Soft tissues and spinal canal: Negative visible neck soft tissues aside from mild calcified carotid atherosclerosis. There is also calcified distal vertebral artery atherosclerosis at the skull base.   Disc levels:   C1-C2: Anterior joint space loss and osteophytosis.   C2-C3:  Disc bulging as seen by MRI.   C3-C4: Prior ACDF with no evidence of hardware loosening and solid appearing interbody arthrodesis (series 6, image 23). Residual endplate spurring most affecting the right C4 neural foramen (sagittal series 6, image 18).   C4-C5: Subtle anterolisthesis. Endplate spurring and mild facet hypertrophy with mild to moderate left C5 foraminal stenosis.   C5-C6:  Negative.   C6-C7: Disc  bulging better demonstrated by MRI. Mild endplate spurring mostly affecting the left C7 neural foramen (sagittal image 31).   C7-T1:  Mild facet hypertrophy on the left.   Upper chest: Evidence of centrilobular emphysema in the lung apices. Visible upper thoracic levels appear intact.   Other: Negative visible noncontrast posterior fossa.   IMPRESSION: 1. C3-C4 ACDF with solid interbody arthrodesis. Residual endplate spurring affects the right C4 neural foramen.   2. No acute osseous abnormality and generally mild for age cervical spine degeneration elsewhere appears stable from the March MRI.    Laboratory Chemistry Profile   Renal Lab Results  Component Value Date   BUN 10 10/20/2022   CREATININE 0.86 10/20/2022   GFRAA >60 04/02/2019   GFRNONAA >60 10/20/2022    Hepatic No results found for: "AST", "ALT", "ALBUMIN", "ALKPHOS", "HCVAB", "AMYLASE", "LIPASE", "AMMONIA"  Electrolytes Lab Results  Component Value Date   NA 134 (L) 10/20/2022   K 3.6 10/20/2022   CL 101 10/20/2022   CALCIUM 8.7 (L) 10/20/2022    Bone No results found for: "VD25OH", "VD125OH2TOT", "ZO1096EA5", "WU9811BJ4", "25OHVITD1", "25OHVITD2", "25OHVITD3", "TESTOFREE", "TESTOSTERONE"  Inflammation (CRP: Acute Phase) (ESR: Chronic Phase) No results found for: "CRP", "ESRSEDRATE", "LATICACIDVEN"       Note: Above Lab results reviewed.   Physical Exam  General appearance: Well nourished, well developed, and well hydrated. In no apparent acute distress Mental status: Alert, oriented x 3 (person, place, & time)       Respiratory: No evidence of acute respiratory distress Eyes: PERLA Vitals: BP (!) 152/69   Pulse (!) 52   Temp (!) 97.3 F (36.3 C)   Ht 5\' 6"  (1.676 m)   Wt 183 lb (83 kg)   SpO2 99%   BMI 29.54 kg/m  BMI: Estimated body mass index is 29.54 kg/m as calculated from the following:   Height as of this encounter: 5\' 6"  (1.676 m).   Weight as of this encounter: 183 lb (83  kg). Ideal:  Ideal body weight: 59.3 kg (130 lb 11.7 oz) Adjusted ideal body weight: 68.8 kg (151 lb 10.2 oz)  Cervical Spine Area Exam  Skin & Axial Inspection: No masses, redness, edema, swelling, or associated skin lesions Alignment: Symmetrical Functional ROM: Pain restricted ROM, to the left Stability: No instability detected Muscle Tone/Strength: Functionally intact. No obvious neuro-muscular anomalies detected. Sensory (Neurological): Dermatomal pain pattern left Palpation: No palpable anomalies              Thoracic Spine Area Exam  Skin & Axial Inspection: No masses, redness, or swelling Alignment: Symmetrical Functional ROM: Pain restricted ROM Stability: No instability detected Muscle Tone/Strength: Functionally intact. No obvious neuro-muscular anomalies detected. Sensory (Neurological): Neurogenic pain pattern Muscle strength & Tone: No palpable anomalies Lumbar Spine Area Exam  Skin & Axial Inspection: Well healed scar from previous spine surgery detected Alignment: Symmetrical Functional ROM: Pain restricted ROM  Stability: No instability detected Muscle Tone/Strength: Functionally intact. No obvious neuro-muscular anomalies detected. Sensory (Neurological): Neurogenic pain pattern left lower extremity  Gait & Posture Assessment  Ambulation: Unassisted Gait: Antalgic Posture: Difficulty standing up straight, due to pain  Lower Extremity Exam    Side: Right lower extremity  Side: Left lower extremity  Stability: No instability observed          Stability: No instability observed          Skin & Extremity Inspection: Skin color, temperature, and hair growth are WNL. No peripheral edema or cyanosis. No masses, redness, swelling, asymmetry, or associated skin lesions. No contractures.  Skin & Extremity Inspection: Skin color, temperature, and hair growth are WNL. No peripheral edema or cyanosis. No masses, redness, swelling, asymmetry, or associated skin lesions. No  contractures.  Functional ROM: Pain restricted ROM for hip joint          Functional ROM: Pain restricted ROM for hip joint          Muscle Tone/Strength: Functionally intact. No obvious neuro-muscular anomalies detected.  Muscle Tone/Strength: Functionally intact. No obvious neuro-muscular anomalies detected.  Sensory (Neurological): Neurogenic pain pattern        Sensory (Neurological): Neurogenic pain pattern        DTR: Patellar: deferred today Achilles: deferred today Plantar: deferred today  DTR: Patellar: deferred today Achilles: deferred today Plantar: deferred today  Palpation: No palpable anomalies  Palpation: No palpable anomalies    Assessment   Diagnosis  1. Chronic pain syndrome   2. DDD (degenerative disc disease), cervical   3. Thoracic disc herniation   4. Radicular pain of thoracic region   5. Inflammatory spondylopathy of cervical region (HCC)   6. Chronic radicular cervical pain          Plan of Care   Ms. Makaelyn D Andre has a current medication list which includes the following long-term medication(s): amlodipine, atorvastatin, bupropion, clonidine, fluticasone-salmeterol, metoprolol tartrate, nitroglycerin, pantoprazole, sulfasalazine, valsartan, duloxetine, gabapentin, [START ON 12/30/2022] hydrocodone-acetaminophen, [START ON 01/29/2023] hydrocodone-acetaminophen, [START ON 02/28/2023] hydrocodone-acetaminophen, and tizanidine.  Pharmacotherapy (Medications Ordered): Meds ordered this encounter  Medications   HYDROcodone-acetaminophen (NORCO) 7.5-325 MG tablet    Sig: Take 1 tablet by mouth every 8 (eight) hours as needed for severe pain. Must last 30 days.    Dispense:  90 tablet    Refill:  0    Chronic Pain: STOP Act (Not applicable) Fill 1 day early if closed on refill date. Avoid benzodiazepines within 8 hours of opioids   HYDROcodone-acetaminophen (NORCO) 7.5-325 MG tablet    Sig: Take  1 tablet by mouth every 8 (eight) hours as needed for severe  pain. Must last 30 days.    Dispense:  90 tablet    Refill:  0    Chronic Pain: STOP Act (Not applicable) Fill 1 day early if closed on refill date. Avoid benzodiazepines within 8 hours of opioids   HYDROcodone-acetaminophen (NORCO) 7.5-325 MG tablet    Sig: Take 1 tablet by mouth every 8 (eight) hours as needed for severe pain. Must last 30 days.    Dispense:  90 tablet    Refill:  0    Chronic Pain: STOP Act (Not applicable) Fill 1 day early if closed on refill date. Avoid benzodiazepines within 8 hours of opioids   gabapentin (NEURONTIN) 300 MG capsule    Sig: Take 3 capsules (900 mg total) by mouth 2 (two) times daily.    Dispense:  180 capsule    Refill:  5   tiZANidine (ZANAFLEX) 4 MG tablet    Sig: Take 1 tablet (4 mg total) by mouth every 12 (twelve) hours as needed.    Dispense:  60 tablet    Refill:  5    Fill one day early if pharmacy is closed on scheduled refill date. Generic permitted. Do not send renewal requests. Void any older duplicate prescription or refill(s) that may be on file.    Orders:  No orders of the defined types were placed in this encounter.  Discussed spinal cord stimulation with patient, provided her with resources.  If she is interested, will need to repeat thoracic, lumbar MRI and obtain psychological clearance.  Follow-up plan:   Return in about 3 months (around 03/29/2023) for Medication Management, in person.    Recent Visits Date Type Provider Dept  10/30/22 Procedure visit Edward Jolly, MD [facility]  09/28/22 Office Visit Edward Jolly, MD [facility]  Showing recent visits within past 90 days and meeting all other requirements Today's Visits Date Type Provider Dept  12/26/22 Office Visit Edward Jolly, MD [facility]  Showing today's visits and meeting all other requirements Future Appointments No visits were found meeting these conditions. Showing future appointments within next 90 days and meeting all  other requirements  I discussed the assessment and treatment plan with the patient. The patient was provided an opportunity to ask questions and all were answered. The patient agreed with the plan and demonstrated an understanding of the instructions.  Patient advised to call back or seek an in-person evaluation if the symptoms or condition worsens.  Duration of encounter: .  Note by: Edward Jolly, MD Date: 12/26/2022; Time: 10:06 AM

## 2022-12-29 ENCOUNTER — Telehealth: Payer: Self-pay | Admitting: Cardiovascular Disease

## 2022-12-29 MED ORDER — METOPROLOL TARTRATE 25 MG PO TABS: 25.0000 mg | ORAL_TABLET | Freq: Two times a day (BID) | ORAL | 3 refills | Status: DC

## 2022-12-29 NOTE — Telephone Encounter (Signed)
*  STAT* If patient is at the pharmacy, call can be transferred to refill team.   1. Which medications need to be refilled? (please list name of each medication and dose if known) metoprolol tartrate (LOPRESSOR) 25 MG tablet  2. Which pharmacy/location (including street and city if local pharmacy) is medication to be sent to? [id number] - [place], [place] - [address] [facility] [address] [facility]  3. Do they need a 30 day or 90 day supply? 90  

## 2023-01-02 ENCOUNTER — Ambulatory Visit: Payer: Medicare Other | Admitting: Cardiovascular Disease

## 2023-01-19 ENCOUNTER — Ambulatory Visit
Admission: RE | Admit: 2023-01-19 | Discharge: 2023-01-19 | Disposition: A | Discharge status: Home / Self Care | Payer: Medicare Other | Source: Ambulatory Visit | Attending: Acute Care | Admitting: Acute Care

## 2023-01-19 DIAGNOSIS — Z87891 Personal history of nicotine dependence: Secondary | ICD-10-CM

## 2023-01-19 DIAGNOSIS — R911 Solitary pulmonary nodule: Secondary | ICD-10-CM

## 2023-01-24 ENCOUNTER — Telehealth: Payer: Self-pay | Admitting: Acute Care

## 2023-01-24 NOTE — Telephone Encounter (Signed)
I have called the patient with the results of her low dose Ct Chest. I explained that her scan was read as a LR 4A. She has 2 nodules that are 7.8 mm in size, 1 of which has grown and the other which is new. Plan will be for 25-month follow-up low-dose screening CT. In addition to the finding of the above nodules there was also finding of a very large hiatal hernia.  I reviewed the scan with Dr. Jayme Cloud he feels best option is to consult with her as there is concerned that she might be aspirating due to her hiatal hernia. Patient is in agreement with consult with Dr. Jayme Cloud to evaluate the hiatal hernia and its possible cause of silent aspiration. Additionally patient does have follow-up with cardiology Friday, July 5. She had a stent placed recently and is on Plavix and aspirin daily. Angelique Blonder please place order for 52-month follow-up low-dose screening CT due the end of September 2024. Delray Alt can we please get patient scheduled with Dr. Jayme Cloud as a consult for her large hiatal hernia that may be contributing to an aspiration pneumonia. Thanks so much

## 2023-01-24 NOTE — Telephone Encounter (Signed)
IMPRESSION: 1. 7.8 mm nodule in the medial aspect of the apical segment right upper lobe with indolent enlargement from 09/28/2021. A second 7.8 mm nodule in the apical segment right upper lobe is new or enlarged from 09/28/2021. Lung-RADS 4A, suspicious. Follow up low-dose chest CT without contrast in 3 months (please use the following order, "CT CHEST LCS NODULE FOLLOW-UP W/O CM") is recommended. Alternatively, PET may be considered when there is a solid component 8mm or larger. These results will be called to the ordering clinician or representative by the Radiologist Assistant, and communication documented in the PACS or [employer]. 2. Large hiatal hernia. 3.  Aortic atherosclerosis (ICD10-I70.0). 4.  Emphysema (ICD10-J43.9).

## 2023-01-24 NOTE — Telephone Encounter (Signed)
Spoke to patient and scheduled appt 02/12/2023 at 3:00 with Dr. Jayme Cloud. Directions provided.  Nothing further needed.   Routed to Maralyn Sago and Dr. Jayme Cloud as an [place] Childes

## 2023-01-24 NOTE — Telephone Encounter (Signed)
Tammy Lamb is calling with a call report.  CB# [phone]

## 2023-01-26 ENCOUNTER — Other Ambulatory Visit: Payer: Self-pay

## 2023-01-26 ENCOUNTER — Encounter: Payer: Self-pay | Admitting: Cardiology

## 2023-01-26 ENCOUNTER — Ambulatory Visit: Payer: Medicare Other | Attending: Cardiology | Admitting: Cardiology

## 2023-01-26 VITALS — BP 122/76 | HR 55 | Ht 65.5 in | Wt 188.2 lb

## 2023-01-26 DIAGNOSIS — Z87891 Personal history of nicotine dependence: Secondary | ICD-10-CM

## 2023-01-26 DIAGNOSIS — I10 Essential (primary) hypertension: Secondary | ICD-10-CM | POA: Insufficient documentation

## 2023-01-26 DIAGNOSIS — R911 Solitary pulmonary nodule: Secondary | ICD-10-CM

## 2023-01-26 DIAGNOSIS — I2510 Atherosclerotic heart disease of native coronary artery without angina pectoris: Secondary | ICD-10-CM | POA: Insufficient documentation

## 2023-01-26 NOTE — Telephone Encounter (Signed)
Order placed for 3 months follow up LDCT for nodule. Results/plan faxed to PCP 

## 2023-01-26 NOTE — Patient Instructions (Addendum)
Medication Instructions:  Your physician recommends that you continue on your current medications as directed. Please refer to the Current Medication list given to you today.  *If you need a refill on your cardiac medications before your next appointment, please call your pharmacy*  Lab Work: -None ordered  Testing/Procedures: -None ordered  Follow-Up: At [facility], you and your health needs are our priority.  As part of our continuing mission to provide you with exceptional heart care, we have created designated Provider Care Teams.  These Care Teams include your primary Cardiologist (physician) and Advanced Practice Providers (APPs -  Physician Assistants and Nurse Practitioners) who all work together to provide you with the care you need, when you need it.  Your next appointment:   6 month(s)  Provider:   You may see Debbe Odea, MD or one of the following Advanced Practice Providers on your designated Care Team:   Nicolasa Ducking, NP Eula Listen, PA-C Cadence Fransico Michael, PA-C Charlsie Quest, NP    Other Instructions -AMB referral to cardiac rehabilitation

## 2023-01-26 NOTE — Progress Notes (Signed)
Cardiology Office Note:    Date:  01/26/2023   ID:  Tammy Lamb, DOB 1951/01/03, MRN [id number]  PCP:  Enid Baas, MD   [facility] Providers Cardiologist:  Debbe Odea, MD     Referring MD: Enid Baas, MD   Chief Complaint  Patient presents with   Follow-up    Patient denies new or acute cardiac problems/concerns today.      History of Present Illness:    Tammy Lamb is a 72 y.o. female with a hx of  CAD s/p PCI proximal RCA 3/24, hypertension, hyperlipidemia, former smoker x 40+ years, COPD who presents for follow-up.  Previously seen due to chest pain consistent with angina, elevated calcium score.  Referred to the ED where left heart cath was performed showing significant RCA stenosis.  Drug-eluting stent was placed.  Feels well, denies chest pain.  Compliant with medications as prescribed.   Past Medical History:  Diagnosis Date   Anemia    h/o   Anxiety    Arthritis    Bilateral carotid artery stenosis    CAD (coronary artery disease), native coronary artery 10/18/2022   DES to RCA ONYX FRONTIER 3.5X15   Chronic lower back pain    Colitis, ulcerative (HCC)    COPD (chronic obstructive pulmonary disease) (HCC)    Depression    Diabetes mellitus without complication (HCC)    GERD (gastroesophageal reflux disease)    H/O bladder infections    History of 2019 novel coronavirus disease (COVID-19) 02/2021   History of hiatal hernia    HLD (hyperlipidemia)    Hypertension    Interstitial cystitis    Low sodium levels    Motion sickness    fair rides   Parkinson's disease    suspected per dr Sherryll Burger @ [facility] neuro   PVD (peripheral vascular disease) (HCC)    Sinus headache    Stroke (HCC) 2015   mild stroke d/t coreg dose being too high and pt. had severe hypotension.   TIA (transient ischemic attack) 2015   no deficits.  per pt due to low BP.   Urinary frequency    Wears dentures    partial upper and lower    Past Surgical  History:  Procedure Laterality Date   ABDOMINAL HYSTERECTOMY     BSO   ANKLE ARTHROSCOPY Left 03/22/2016   Procedure: ANKLE ARTHROSCOPY DEBRODEMENT EXTENSIVE LEFT FLEXAR TENDON REPAIR;  Surgeon: Gwyneth Revels, DPM;  Location: [facility];  Service: Podiatry;  Laterality: Left;  WITH POPLITEAL   ANTERIOR CERVICAL DECOMP/DISCECTOMY FUSION N/A 04/09/2019   Procedure: ANTERIOR CERVICAL DECOMPRESSION/DISCECTOMY FUSION 1 LEVEL C3-4;  Surgeon: Venetia Night, MD;  Location: [facility];  Service: Neurosurgery;  Laterality: N/A;   BLADDER SUSPENSION     tac   CHOLECYSTECTOMY     COLONOSCOPY     CORONARY STENT INTERVENTION N/A 10/18/2022   Procedure: CORONARY STENT INTERVENTION;  Surgeon: Iran Ouch, MD;  Location: [facility];  Service: Cardiovascular;  Laterality: N/A;   DIAGNOSTIC LAPAROSCOPY     adhesions   KNEE ARTHROSCOPY Left 02/13/2014   Procedure: LEFT ARTHROSCOPY KNEE, PARTIAL MEDIAL MENISECTOMY AND PLICA, CHONDROPLASTY OF PATELLA FEMORAL JOINT;  Surgeon: Harvie Junior, MD;  Location: [facility];  Service: Orthopedics;  Laterality: Left;   LEFT HEART CATH AND CORONARY ANGIOGRAPHY N/A 10/18/2022   Procedure: LEFT HEART CATH AND CORONARY ANGIOGRAPHY;  Surgeon: Iran Ouch, MD;  Location: [facility];  Service: Cardiovascular;  Laterality: N/A;   LUMBAR LAMINECTOMY/DECOMPRESSION MICRODISCECTOMY N/A 05/30/2017   Procedure: LUMBAR LAMINECTOMY/DECOMPRESSION MICRODISCECTOMY 3 LEVELS-L3-S1;  Surgeon: Venetia Night, MD;  Location: [facility];  Service: Neurosurgery;  Laterality: N/A;   LUMBAR LAMINECTOMY/DECOMPRESSION MICRODISCECTOMY N/A 05/27/2021   Procedure: L4-S1 DECOMPRESSION;  Surgeon: Venetia Night, MD;  Location: [facility];  Service: Neurosurgery;  Laterality: N/A;   TENDON REPAIR Left 03/22/2016   Procedure: ZOXWRU TENDON REPAIR;  Surgeon: Gwyneth Revels, DPM;  Location: [facility];  Service: Podiatry;  Laterality: Left;    TONSILLECTOMY     UPPER GI ENDOSCOPY      Current Medications: Current Meds  Medication Sig   acetaminophen (TYLENOL) 500 MG tablet Take 1,000 mg by mouth every 6 (six) hours as needed for moderate pain or headache.   amLODipine (NORVASC) 10 MG tablet Take 10 mg by mouth every morning.   ascorbic acid (VITAMIN C) 1000 MG tablet Take 1,000 mg by mouth at bedtime.   aspirin EC 81 MG tablet Take 1 tablet (81 mg total) by mouth daily. Swallow whole.   atorvastatin (LIPITOR) 80 MG tablet Take 80 mg by mouth at bedtime.   carbidopa-levodopa (SINEMET IR) 25-100 MG tablet Take 1 tablet by mouth 3 (three) times daily.   cephALEXin (KEFLEX) 250 MG capsule Take 250 mg by mouth every morning.   cholecalciferol (VITAMIN D) 25 MCG (1000 UNIT) tablet Take 1,000 Units by mouth at bedtime.   Cinnamon 500 MG TABS Take 500 mg by mouth in the morning and at bedtime.   citalopram (CELEXA) 20 MG tablet Take 20 mg by mouth every morning.   clopidogrel (PLAVIX) 75 MG tablet Take 1 tablet (75 mg total) by mouth daily with breakfast.   DULoxetine (CYMBALTA) 60 MG capsule Take 60 mg by mouth daily.   fluticasone-salmeterol (ADVAIR) 250-50 MCG/ACT AEPB Inhale 1 puff into the lungs in the morning and at bedtime.   gabapentin (NEURONTIN) 300 MG capsule Take 3 capsules (900 mg total) by mouth 2 (two) times daily.   HYDROcodone-acetaminophen (NORCO) 7.5-325 MG tablet Take 1 tablet by mouth every 8 (eight) hours as needed for severe pain. Must last 30 days.   [START ON 01/29/2023] HYDROcodone-acetaminophen (NORCO) 7.5-325 MG tablet Take 1 tablet by mouth every 8 (eight) hours as needed for severe pain. Must last 30 days.   [START ON 02/28/2023] HYDROcodone-acetaminophen (NORCO) 7.5-325 MG tablet Take 1 tablet by mouth every 8 (eight) hours as needed for severe pain. Must last 30 days.   hydrOXYzine (ATARAX/VISTARIL) 25 MG tablet Take 25 mg by mouth at bedtime.   magnesium oxide (MAG-OX) 400 MG tablet Take 400 mg by mouth at  bedtime.   metoprolol tartrate (LOPRESSOR) 25 MG tablet Take 1 tablet (25 mg total) by mouth 2 (two) times daily.   nitroGLYCERIN (NITROSTAT) 0.4 MG SL tablet Place 1 tablet (0.4 mg total) under the tongue every 5 (five) minutes as needed for chest pain.   Omega-3 Fatty Acids (FISH OIL) 1200 MG CAPS Take 1,200 mg by mouth daily.   oxybutynin (DITROPAN-XL) 10 MG 24 hr tablet Take 10 mg by mouth at bedtime.   pantoprazole (PROTONIX) 40 MG tablet Take 1 tablet (40 mg total) by mouth daily.   pentosan polysulfate (ELMIRON) 100 MG capsule Take 100 mg by mouth 2 (two) times daily.   propranolol (INDERAL) 20 MG tablet Take 1 tablet by mouth 2 (two) times daily.   sulfaSALAzine (AZULFIDINE) 500 MG tablet Take 500 mg by mouth 2 (two) times daily.   tiZANidine (  ZANAFLEX) 4 MG tablet Take 1 tablet (4 mg total) by mouth every 12 (twelve) hours as needed.   valsartan (DIOVAN) 160 MG tablet Take 160 mg by mouth daily.   VENTOLIN HFA 108 (90 Base) MCG/ACT inhaler Inhale 2 puffs into the lungs every 6 (six) hours as needed for wheezing or shortness of breath.    vitamin B-12 (CYANOCOBALAMIN) 1000 MCG tablet Take 1,000 mcg by mouth daily.     Allergies:   Aspirin and Sulfa antibiotics   Social History   Socioeconomic History   Marital status: Married    Spouse name: Not on file   Number of children: Not on file   Years of education: Not on file   Highest education level: Not on file  Occupational History   Not on file  Tobacco Use   Smoking status: Former    Packs/day: 0.50    Years: 49.00    Additional pack years: 0.00    Total pack years: 24.50    Types: Cigarettes   Smokeless tobacco: Never   Tobacco comments:    1 pack every 3-4 days 10-18-22  Vaping Use   Vaping Use: Never used  Substance and Sexual Activity   Alcohol use: Not Currently    Comment: Occassional,   Drug use: No   Sexual activity: Never  Other Topics Concern   Not on file  Social History Narrative   Not on file    Social Determinants of Health   Financial Resource Strain: Not on file  Food Insecurity: No Food Insecurity (10/18/2022)   Hunger Vital Sign    Worried About Running Out of Food in the Last Year: Never true    Ran Out of Food in the Last Year: Never true  Transportation Needs: No Transportation Needs (10/18/2022)   PRAPARE - Administrator, Civil Service (Medical): No    Lack of Transportation (Non-Medical): No  Physical Activity: Not on file  Stress: Not on file  Social Connections: Not on file     Family History: The patient's family history includes COPD in her father; Diabetes in her mother; Heart disease in her father; Hypertension in her father and mother. There is no history of Breast cancer.  ROS:   Please see the history of present illness.     All other systems reviewed and are negative.  EKGs/Labs/Other Studies Reviewed:    The following studies were reviewed today:   EKG:  EKG not ordered today.    Recent Labs: 10/18/2022: B Natriuretic Peptide 114.6; TSH 3.245 10/20/2022: BUN 10; Creatinine, Ser 0.86; Hemoglobin 11.6; Platelets 232; Potassium 3.6; Sodium 134  Recent Lipid Panel    Component Value Date/Time   CHOL 158 10/19/2022 0541   TRIG 56 10/19/2022 0541   HDL 84 10/19/2022 0541   CHOLHDL 1.9 10/19/2022 0541   VLDL 11 10/19/2022 0541   LDLCALC 63 10/19/2022 0541     Risk Assessment/Calculations:             Physical Exam:    VS:  BP 122/76 (BP Location: Left Arm, Patient Position: Sitting, Cuff Size: Large)   Pulse (!) 55   Ht 5' 5.5" (1.664 m)   Wt 188 lb 3.2 oz (85.4 kg)   SpO2 96%   BMI 30.84 kg/m     Wt Readings from Last 3 Encounters:  01/26/23 188 lb 3.2 oz (85.4 kg)  12/26/22 183 lb (83 kg)  11/01/22 179 lb (81.2 kg)     GEN:  Well nourished, mild to moderate chest discomfort. HEENT: Normal NECK: No JVD; No carotid bruits CARDIAC: RRR, no murmurs, rubs, gallops RESPIRATORY:  Clear to auscultation without rales,  wheezing or rhonchi  ABDOMEN: Soft, non-tender, non-distended MUSCULOSKELETAL:  No edema; No deformity  SKIN: Warm and dry NEUROLOGIC:  Alert and oriented x 3 PSYCHIATRIC:  Normal affect   ASSESSMENT:    1. Coronary artery disease involving native coronary artery of native heart, unspecified whether angina present   2. Primary hypertension     PLAN:    In order of problems listed above:  CAD s/p PCI to proximal RCA 3/24.  Continue aspirin 81 mg daily, Plavix 75 mg daily, Lipitor 80.  EF 60 to 65%.  Refer to cardiac rehab. Hypertension, BP controlled.  Continue Norvasc 10 mg daily, valsartan 160 mg daily, Lopressor 25 mg twice daily.  Follow-up in 6 months.   Cardiac Rehabilitation Eligibility Assessment  The patient is ready to start cardiac rehabilitation from a cardiac standpoint.     Medication Adjustments/Labs and Tests Ordered: Current medicines are reviewed at length with the patient today.  Concerns regarding medicines are outlined above.  Orders Placed This Encounter  Procedures   AMB referral to cardiac rehabilitation   No orders of the defined types were placed in this encounter.   Patient Instructions  Medication Instructions:  Your physician recommends that you continue on your current medications as directed. Please refer to the Current Medication list given to you today.  *If you need a refill on your cardiac medications before your next appointment, please call your pharmacy*  Lab Work: -None ordered  Testing/Procedures: -None ordered  Follow-Up: At [facility], you and your health needs are our priority.  As part of our continuing mission to provide you with exceptional heart care, we have created designated Provider Care Teams.  These Care Teams include your primary Cardiologist (physician) and Advanced Practice Providers (APPs -  Physician Assistants and Nurse Practitioners) who all work together to provide you with the care you need, when  you need it.  Your next appointment:   6 month(s)  Provider:   You may see Debbe Odea, MD or one of the following Advanced Practice Providers on your designated Care Team:   Nicolasa Ducking, NP Eula Listen, PA-C Cadence Fransico Michael, PA-C Charlsie Quest, NP    Other Instructions -AMB referral to cardiac rehabilitation   Signed, Debbe Odea, MD  01/26/2023 10:48 AM    [facility]

## 2023-01-26 NOTE — Telephone Encounter (Signed)
Noted  

## 2023-02-12 ENCOUNTER — Institutional Professional Consult (permissible substitution): Payer: Medicare Other | Admitting: Pulmonary Disease

## 2023-02-13 ENCOUNTER — Ambulatory Visit (INDEPENDENT_AMBULATORY_CARE_PROVIDER_SITE_OTHER): Payer: Medicare Other | Admitting: Pulmonary Disease

## 2023-02-13 ENCOUNTER — Encounter: Payer: Self-pay | Admitting: Pulmonary Disease

## 2023-02-13 VITALS — BP 126/82 | HR 55 | Temp 97.5°F | Ht 65.5 in | Wt 194.2 lb

## 2023-02-13 DIAGNOSIS — J449 Chronic obstructive pulmonary disease, unspecified: Secondary | ICD-10-CM

## 2023-02-13 DIAGNOSIS — R911 Solitary pulmonary nodule: Secondary | ICD-10-CM

## 2023-02-13 DIAGNOSIS — K449 Diaphragmatic hernia without obstruction or gangrene: Secondary | ICD-10-CM

## 2023-02-13 DIAGNOSIS — Z87891 Personal history of nicotine dependence: Secondary | ICD-10-CM

## 2023-02-13 DIAGNOSIS — K219 Gastro-esophageal reflux disease without esophagitis: Secondary | ICD-10-CM

## 2023-02-13 MED ORDER — TRELEGY ELLIPTA 100-62.5-25 MCG/ACT IN AEPB: 1.0000 | INHALATION_SPRAY | Freq: Every day | RESPIRATORY_TRACT | Status: DC

## 2023-02-13 MED ORDER — ALBUTEROL SULFATE HFA 108 (90 BASE) MCG/ACT IN AERS: 2.0000 | INHALATION_SPRAY | Freq: Four times a day (QID) | RESPIRATORY_TRACT | 6 refills | Status: AC | PRN

## 2023-02-13 NOTE — Progress Notes (Signed)
Subjective:    Patient ID: Tammy Lamb, female    DOB: 1951/02/10, 72 y.o.   MRN: [id number]  Patient Care Team: Enid Baas, MD as PCP - General (Internal Medicine) Debbe Odea, MD as PCP - Cardiology (Cardiology)  Chief Complaint  Patient presents with   Consult    Nodule. Dry cough since stent placement. Wheezing. DOE.   HPI Patient is a 72 year old recent former smoker (March 2024, 49 PY) history as noted below who presents for evaluation of an lung nodule incidentally found on lung cancer screening CT.  She is kindly referred by Dr. Enid Baas.  The patient has had issues with dyspnea on exertion, wheezing and cough productive of yellowish sputum for a number of years.  She has been a smoker of longstanding, she quit smoking in March after cardiac catheterization for unstable angina.  She required placement of a stent to the proximal RCA lesion.  Per the cardiology notes the patient will need antiplatelet therapy for at least 12 months.  She has not had any fevers, chills or sweats.  Has had dyspnea on exertion that has improved after stent placement but continues to be symptomatic.  He notes wheezing as noted above.  She has not had any orthopnea or paroxysmal nocturnal dyspnea since her stent placement.  No lower extremity edema or calf tenderness.  She does not endorse any other symptomatology.  Past medical, surgical, family and social histories were reviewed.  Does not have any significant occupational exposure.  No significant lung cancer history in the family.   Review of Systems A 10 point review of systems was performed and it is as noted above otherwise negative.   Past Medical History:  Diagnosis Date   Anemia    h/o   Anxiety    Arthritis    Bilateral carotid artery stenosis    CAD (coronary artery disease), native coronary artery 10/18/2022   DES to RCA ONYX FRONTIER 3.5X15   Chronic lower back pain    Colitis, ulcerative (HCC)    COPD  (chronic obstructive pulmonary disease) (HCC)    Depression    Diabetes mellitus without complication (HCC)    GERD (gastroesophageal reflux disease)    H/O bladder infections    History of 2019 novel coronavirus disease (COVID-19) 02/2021   History of hiatal hernia    HLD (hyperlipidemia)    Hypertension    Interstitial cystitis    Low sodium levels    Motion sickness    fair rides   Parkinson's disease    suspected per dr Sherryll Burger @ [facility] neuro   PVD (peripheral vascular disease) (HCC)    Sinus headache    Stroke (HCC) 2015   mild stroke d/t coreg dose being too high and pt. had severe hypotension.   TIA (transient ischemic attack) 2015   no deficits.  per pt due to low BP.   Urinary frequency    Wears dentures    partial upper and lower    Past Surgical History:  Procedure Laterality Date   ABDOMINAL HYSTERECTOMY     BSO   ANKLE ARTHROSCOPY Left 03/22/2016   Procedure: ANKLE ARTHROSCOPY DEBRODEMENT EXTENSIVE LEFT FLEXAR TENDON REPAIR;  Surgeon: Gwyneth Revels, DPM;  Location: [facility];  Service: Podiatry;  Laterality: Left;  WITH POPLITEAL   ANTERIOR CERVICAL DECOMP/DISCECTOMY FUSION N/A 04/09/2019   Procedure: ANTERIOR CERVICAL DECOMPRESSION/DISCECTOMY FUSION 1 LEVEL C3-4;  Surgeon: Venetia Night, MD;  Location: [facility];  Service: Neurosurgery;  Laterality: N/A;  BLADDER SUSPENSION     tac   CHOLECYSTECTOMY     COLONOSCOPY     CORONARY STENT INTERVENTION N/A 10/18/2022   Procedure: CORONARY STENT INTERVENTION;  Surgeon: Iran Ouch, MD;  Location: [facility];  Service: Cardiovascular;  Laterality: N/A;   DIAGNOSTIC LAPAROSCOPY     adhesions   KNEE ARTHROSCOPY Left 02/13/2014   Procedure: LEFT ARTHROSCOPY KNEE, PARTIAL MEDIAL MENISECTOMY AND PLICA, CHONDROPLASTY OF PATELLA FEMORAL JOINT;  Surgeon: Harvie Junior, MD;  Location: [facility];  Service: Orthopedics;  Laterality: Left;   LEFT HEART CATH AND CORONARY ANGIOGRAPHY N/A  10/18/2022   Procedure: LEFT HEART CATH AND CORONARY ANGIOGRAPHY;  Surgeon: Iran Ouch, MD;  Location: [facility];  Service: Cardiovascular;  Laterality: N/A;   LUMBAR LAMINECTOMY/DECOMPRESSION MICRODISCECTOMY N/A 05/30/2017   Procedure: LUMBAR LAMINECTOMY/DECOMPRESSION MICRODISCECTOMY 3 LEVELS-L3-S1;  Surgeon: Venetia Night, MD;  Location: [facility];  Service: Neurosurgery;  Laterality: N/A;   LUMBAR LAMINECTOMY/DECOMPRESSION MICRODISCECTOMY N/A 05/27/2021   Procedure: L4-S1 DECOMPRESSION;  Surgeon: Venetia Night, MD;  Location: [facility];  Service: Neurosurgery;  Laterality: N/A;   TENDON REPAIR Left 03/22/2016   Procedure: ZOXWRU TENDON REPAIR;  Surgeon: Gwyneth Revels, DPM;  Location: [facility];  Service: Podiatry;  Laterality: Left;   TONSILLECTOMY     UPPER GI ENDOSCOPY      Patient Active Problem List   Diagnosis Date Noted   Influenza A 10/20/2022   Coronavirus infection 10/20/2022   Chest pain 10/19/2022   Coronary artery disease involving native coronary artery of native heart with unstable angina pectoris (HCC) 10/19/2022   Status post primary angioplasty with coronary stent 10/19/2022   Unstable angina (HCC) 10/18/2022   COPD with acute exacerbation (HCC) 10/18/2022   AKI (acute kidney injury) (HCC) 10/18/2022   Ulcerative colitis (HCC) 10/18/2022   Bilateral hip pain 12/26/2021   Thoracic disc herniation 08/25/2021   Radicular pain of thoracic region 08/25/2021   Atherosclerotic peripheral vascular disease with intermittent claudication (HCC) 03/08/2021   Benign essential HTN 03/08/2021   Bilateral carotid artery stenosis 03/08/2021   Hyperlipidemia, mixed 03/08/2021   Bilateral occipital neuralgia 09/16/2020   Chronic SI joint pain 09/16/2020   Primary parkinsonism 02/11/2020   High-tone pelvic floor dysfunction 08/21/2019   Recurrent UTI 08/21/2019   S/P cervical spinal fusion (C3-C4 ACDF (03/2019) 04/29/2019   Cervicalgia 04/29/2019    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   Cervical facet joint syndrome 10/01/2018   DDD (degenerative disc disease), cervical 10/01/2018   Chronic radicular cervical pain 10/01/2018   Compression fracture of lumbar vertebra, non-traumatic, sequela 02/28/2018   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/28/2018   Spinal stenosis, lumbar region, with neurogenic claudication 09/27/2017   Lumbar radiculopathy 09/27/2017   Lumbar degenerative disc disease 09/27/2017   Lumbar facet arthropathy 09/27/2017   Chronic pain syndrome 09/27/2017   Neurogenic claudication 05/30/2017    Family History  Problem Relation Age of Onset   Diabetes Mother    Hypertension Mother    Hypertension Father    COPD Father    Heart disease Father    Breast cancer Neg Hx     Social History   Tobacco Use   Smoking status: Former    Current packs/day: 1.00    Average packs/day: 1 pack/day for 49.0 years (49.0 ttl pk-yrs)    Types: Cigarettes   Smokeless tobacco: Never   Tobacco comments:    Quit in March of 2024.  Substance  Use Topics   Alcohol use: Not Currently    Comment: Occassional,    Allergies  Allergen Reactions   Aspirin Other (See Comments)    GERD   Sulfa Antibiotics Nausea And Vomiting    Current Meds  Medication Sig   acetaminophen (TYLENOL) 500 MG tablet Take 1,000 mg by mouth every 6 (six) hours as needed for moderate pain or headache.   ascorbic acid (VITAMIN C) 1000 MG tablet Take 1,000 mg by mouth at bedtime.   aspirin EC 81 MG tablet Take 1 tablet (81 mg total) by mouth daily. Swallow whole.   atorvastatin (LIPITOR) 80 MG tablet Take 80 mg by mouth at bedtime.   cephALEXin (KEFLEX) 250 MG capsule Take 250 mg by mouth every morning.   cholecalciferol (VITAMIN D) 25 MCG (1000 UNIT) tablet Take 1,000 Units by mouth at bedtime.   Cinnamon 500 MG TABS Take 500 mg by mouth in the morning and at bedtime.   citalopram (CELEXA) 20 MG tablet  Take 20 mg by mouth every morning.   clopidogrel (PLAVIX) 75 MG tablet Take 1 tablet (75 mg total) by mouth daily with breakfast.   gabapentin (NEURONTIN) 300 MG capsule Take 3 capsules (900 mg total) by mouth 2 (two) times daily.   [START ON 02/28/2023] HYDROcodone-acetaminophen (NORCO) 7.5-325 MG tablet Take 1 tablet by mouth every 8 (eight) hours as needed for severe pain. Must last 30 days.   hydrOXYzine (ATARAX/VISTARIL) 25 MG tablet Take 25 mg by mouth at bedtime.   magnesium oxide (MAG-OX) 400 MG tablet Take 400 mg by mouth at bedtime.   metoprolol tartrate (LOPRESSOR) 25 MG tablet Take 1 tablet (25 mg total) by mouth 2 (two) times daily.   Omega-3 Fatty Acids (FISH OIL) 1200 MG CAPS Take 1,200 mg by mouth daily.   oxybutynin (DITROPAN-XL) 10 MG 24 hr tablet Take 10 mg by mouth at bedtime.   pantoprazole (PROTONIX) 40 MG tablet Take 1 tablet (40 mg total) by mouth daily.   pentosan polysulfate (ELMIRON) 100 MG capsule Take 100 mg by mouth 2 (two) times daily.   propranolol (INDERAL) 20 MG tablet Take 1 tablet by mouth 2 (two) times daily.   sulfaSALAzine (AZULFIDINE) 500 MG tablet Take 500 mg by mouth 2 (two) times daily.   tiZANidine (ZANAFLEX) 4 MG tablet Take 1 tablet (4 mg total) by mouth every 12 (twelve) hours as needed.   valsartan (DIOVAN) 160 MG tablet Take 160 mg by mouth daily.   vitamin B-12 (CYANOCOBALAMIN) 1000 MCG tablet Take 1,000 mcg by mouth daily.    Immunization History  Administered Date(s) Administered   Influenza Inj Mdck Quad Pf 05/17/2021   Janssen (J&J) SARS-COV-2 Vaccination 11/24/2019   Moderna Sars-Covid-2 Vaccination 07/25/2020   PNEUMOCOCCAL CONJUGATE-20 06/28/2022   Tdap 05/17/2021        Objective:     BP 126/82 (BP Location: Left Arm, Cuff Size: Normal)   Pulse (!) 55   Temp (!) 97.5 F (36.4 C)   Ht 5' 5.5" (1.664 m)   Wt 194 lb 3.2 oz (88.1 kg)   SpO2 95%   BMI 31.83 kg/m   SpO2: 95 % O2 Device: None (Room air)  GENERAL: Obese  woman, no acute distress, fully ambulatory, no conversational dyspnea. HEAD: Normocephalic, atraumatic.  EYES: Pupils equal, round, reactive to light.  No scleral icterus.  MOUTH: Multiple fillings noted at teeth.  Oral mucosa moist.  No thrush. NECK: Supple. No thyromegaly. Trachea midline. No JVD.  No adenopathy. PULMONARY:  Good air entry bilaterally.  Scattered rhonchi with end expiratory wheezes noted. CARDIOVASCULAR: S1 and S2. Regular rate and rhythm.  No rubs, murmurs or gallops heard. ABDOMEN: Obese, otherwise benign. MUSCULOSKELETAL: No joint deformity, no clubbing, no edema.  NEUROLOGIC: No overt focal deficit, no gait disturbance, speech is fluent. SKIN: Intact,warm,dry.  On limited exam, no overt rashes. PSYCH: Mood and behavior normal.  Representative image from the CT performed 19 January 2023 showing the subsolid nodule on the right upper lobe:     Representative image from the CT performed 19 January 2023 showing a large hiatal hernia with intrathoracic stomach:  Assessment & Plan:     ICD-10-CM   1. Lung nodule  R91.1    Nodify Lung ([employer]) assay Repeat CT chest 23 April 2023    2. COPD suggested by initial evaluation (HCC)  J44.9 Pulmonary Function Test [facility] Only   Appears poorly compensated PFTs Albuterol as needed Trial of Trelegy Ellipta 100,1 puff daily    3. Hiatal hernia with GERD  K44.9 DG ESOPHAGUS W DOUBLE CM (HD)   K21.9    This issue adds complexity to her management High risk for chronic silent aspiration Antireflux measures Continue PPI Esophagogram    4. Former cigarette smoker  Z87.891    No evidence of relapse      Orders Placed This Encounter  Procedures   DG ESOPHAGUS W DOUBLE CM (HD)    Standing Status:   Future    Standing Expiration Date:   02/13/2024    Order Specific Question:   Reason for Exam (SYMPTOM  OR DIAGNOSIS REQUIRED)    Answer:   Hiatal hernia    Order Specific Question:   Preferred imaging location?    Answer:    [facility]   Pulmonary Function Test [facility] Only    Standing Status:   Future    Standing Expiration Date:   02/13/2024    Order Specific Question:   Full PFT: includes the following: basic spirometry, spirometry pre & post bronchodilator, diffusion capacity (DLCO), lung volumes    Answer:   Full PFT    Order Specific Question:   This test can only be performed at    Answer:   [facility]    Meds ordered this encounter  Medications   albuterol (VENTOLIN HFA) 108 (90 Base) MCG/ACT inhaler    Sig: Inhale 2 puffs into the lungs every 6 (six) hours as needed for wheezing or shortness of breath.    Dispense:  8 g    Refill:  6   Fluticasone-Umeclidin-Vilant (TRELEGY ELLIPTA) 100-62.5-25 MCG/ACT AEPB    Sig: Inhale 1 puff into the lungs daily.    Order Specific Question:   Lot Number?    Answer:   [id number]    Order Specific Question:   Expiration Date?    Answer:   05/24/2024    Order Specific Question:   Quantity    Answer:   2   Smoking cessation instruction/counseling given:  commended patient for quitting and reviewed strategies for preventing relapses.  Will see the patient in follow-up after her September 30 CT chest.  She is to contact us prior to that time should any new difficulties arise.  Gailen Shelter, MD Advanced Bronchoscopy PCCM [facility]    *This note was dictated using voice recognition software/Dragon.  Despite best efforts to proofread, errors can occur which can change the meaning. Any transcriptional errors that result from this process are unintentional and may not be  fully corrected at the time of dictation.

## 2023-02-13 NOTE — Patient Instructions (Addendum)
We are going to get some breathing tests.  We did a blood test to tell us more information about your nodule in the lung.  We ordered a test of your esophagus and stomach to evaluate your hiatal hernia.  We sent a prescription for albuterol to [facility] in [place].  We are giving you a trial of Trelegy Ellipta that is an inhaler that is 1 puff daily to see if this helps with your breathing and cough.  Make sure you rinse your mouth well after you use it.  Let us know how you do with this inhaler.  It appears to be covered by your insurance.  Follow-up CT of the chest will be on September 30.  We will see you around the first part of October to discuss the findings of that CT.

## 2023-02-16 ENCOUNTER — Encounter: Payer: Self-pay | Admitting: Pulmonary Disease

## 2023-02-19 ENCOUNTER — Telehealth: Payer: Self-pay | Admitting: Student in an Organized Health Care Education/Training Program

## 2023-02-19 NOTE — Telephone Encounter (Signed)
Please call pharmacy about tizanidine script. Pharmacy says she does not have any refills.

## 2023-02-19 NOTE — Telephone Encounter (Signed)
Called [facility], there is a current Rx for Tizanidine, as indicated in the chart. Patient notified per voicemail.

## 2023-02-20 ENCOUNTER — Encounter: Payer: Medicare Other | Admitting: *Deleted

## 2023-02-20 DIAGNOSIS — Z955 Presence of coronary angioplasty implant and graft: Secondary | ICD-10-CM

## 2023-02-20 NOTE — Progress Notes (Signed)
Initial phone call completed. Diagnosis can be found in [facility] 7/5. EP Orientation scheduled for Wednesday 8/14 at 1:30.  Tammy Lamb has recently quit tobacco use within the last 6 months. Intervention for relapse prevention was provided at the initial medical review. She was encouraged to continue to with tobacco cessation and was provided information on relapse prevention. Patient received information about combination therapy, tobacco cessation classes, quit line, and quit smoking apps in case of a relapse. Patient demonstrated understanding of this material.Staff will continue to provide encouragement and follow up with the patient throughout the program.

## 2023-03-07 ENCOUNTER — Ambulatory Visit: Payer: Medicare Other

## 2023-03-08 ENCOUNTER — Other Ambulatory Visit: Payer: Self-pay | Admitting: Pulmonary Disease

## 2023-03-08 ENCOUNTER — Ambulatory Visit
Admission: RE | Admit: 2023-03-08 | Discharge: 2023-03-08 | Disposition: A | Discharge status: Home / Self Care | Payer: Medicare Other | Source: Ambulatory Visit | Attending: Pulmonary Disease | Admitting: Pulmonary Disease

## 2023-03-08 ENCOUNTER — Ambulatory Visit (INDEPENDENT_AMBULATORY_CARE_PROVIDER_SITE_OTHER): Payer: Medicare Other

## 2023-03-08 DIAGNOSIS — K219 Gastro-esophageal reflux disease without esophagitis: Secondary | ICD-10-CM | POA: Diagnosis present

## 2023-03-08 DIAGNOSIS — J449 Chronic obstructive pulmonary disease, unspecified: Secondary | ICD-10-CM

## 2023-03-08 DIAGNOSIS — Z87891 Personal history of nicotine dependence: Secondary | ICD-10-CM

## 2023-03-08 DIAGNOSIS — K449 Diaphragmatic hernia without obstruction or gangrene: Secondary | ICD-10-CM | POA: Diagnosis present

## 2023-03-08 DIAGNOSIS — R911 Solitary pulmonary nodule: Secondary | ICD-10-CM

## 2023-03-08 MED ORDER — ALBUTEROL SULFATE (2.5 MG/3ML) 0.083% IN NEBU
2.5000 mg | INHALATION_SOLUTION | Freq: Once | RESPIRATORY_TRACT | Status: AC
  Filled 2023-03-08: qty 3

## 2023-03-09 LAB — PULMONARY FUNCTION TEST ARMC ONLY
DL/VA % pred: 61 %
DL/VA: 2.54 ml/min/mmHg/L
DLCO unc % pred: 58 %
DLCO unc: 11.63 ml/min/mmHg
FEF 25-75 Post: 0.93 L/s
FEF 25-75 Pre: 0.67 L/s
FEF2575-%Change-Post: 37 %
FEF2575-%Pred-Post: 49 %
FEF2575-%Pred-Pre: 36 %
FEV1-%Change-Post: 6 %
FEV1-%Pred-Post: 59 %
FEV1-%Pred-Pre: 56 %
FEV1-Post: 1.37 L
FEV1-Pre: 1.29 L
FEV1FVC-%Change-Post: 4 %
FEV1FVC-%Pred-Pre: 90 %
FEV6-%Change-Post: 1 %
FEV6-%Pred-Post: 65 %
FEV6-%Pred-Pre: 64 %
FEV6-Post: 1.9 L
FEV6-Pre: 1.88 L
FEV6FVC-%Change-Post: 0 %
FEV6FVC-%Pred-Post: 103 %
FEV6FVC-%Pred-Pre: 104 %
FVC-%Change-Post: 1 %
FVC-%Pred-Post: 62 %
FVC-%Pred-Pre: 62 %
FVC-Post: 1.92 L
FVC-Pre: 1.89 L
Post FEV1/FVC ratio: 72 %
Post FEV6/FVC ratio: 99 %
Pre FEV1/FVC ratio: 68 %
Pre FEV6/FVC Ratio: 99 %
RV % pred: 125 %
RV: 2.87 L
TLC % pred: 102 %
TLC: 5.33 L

## 2023-03-09 LAB — COLOGUARD: COLOGUARD: NEGATIVE

## 2023-03-12 ENCOUNTER — Other Ambulatory Visit: Payer: Self-pay

## 2023-03-12 NOTE — Telephone Encounter (Signed)
Refill request received for Pantoprazole 40 mg every day.  Would you like for her to continue medication?  10/2922 phone note: Start patient on Protonix 40 mg daily to help with symptoms of gastric irritation.  Due to recently placed stent, dual antiplatelet therapy is typically recommended for at least 3 months.

## 2023-03-13 MED ORDER — PANTOPRAZOLE SODIUM 40 MG PO TBEC: 40.0000 mg | DELAYED_RELEASE_TABLET | Freq: Every day | ORAL | 3 refills | Status: DC

## 2023-03-19 ENCOUNTER — Ambulatory Visit: Payer: Medicare Other

## 2023-03-27 ENCOUNTER — Encounter: Payer: Self-pay | Admitting: Student in an Organized Health Care Education/Training Program

## 2023-03-27 ENCOUNTER — Ambulatory Visit
Payer: Medicare Other | Attending: Student in an Organized Health Care Education/Training Program | Admitting: Student in an Organized Health Care Education/Training Program

## 2023-03-27 VITALS — BP 173/93 | HR 58 | Temp 97.1°F | Resp 18 | Ht 65.0 in | Wt 192.0 lb

## 2023-03-27 DIAGNOSIS — M5414 Radiculopathy, thoracic region: Secondary | ICD-10-CM | POA: Diagnosis present

## 2023-03-27 DIAGNOSIS — M5124 Other intervertebral disc displacement, thoracic region: Secondary | ICD-10-CM | POA: Insufficient documentation

## 2023-03-27 DIAGNOSIS — M4692 Unspecified inflammatory spondylopathy, cervical region: Secondary | ICD-10-CM | POA: Insufficient documentation

## 2023-03-27 DIAGNOSIS — G894 Chronic pain syndrome: Secondary | ICD-10-CM | POA: Diagnosis present

## 2023-03-27 DIAGNOSIS — M5030 Other cervical disc degeneration, unspecified cervical region: Secondary | ICD-10-CM | POA: Diagnosis present

## 2023-03-27 DIAGNOSIS — M25562 Pain in left knee: Secondary | ICD-10-CM | POA: Insufficient documentation

## 2023-03-27 DIAGNOSIS — M1711 Unilateral primary osteoarthritis, right knee: Secondary | ICD-10-CM | POA: Insufficient documentation

## 2023-03-27 DIAGNOSIS — G8929 Other chronic pain: Secondary | ICD-10-CM | POA: Insufficient documentation

## 2023-03-27 MED ORDER — HYDROCODONE-ACETAMINOPHEN 7.5-325 MG PO TABS: 1.0000 | ORAL_TABLET | Freq: Three times a day (TID) | ORAL | 0 refills | Status: DC | PRN

## 2023-03-27 NOTE — Progress Notes (Signed)
Nursing Pain Medication Assessment:  Safety precautions to be maintained throughout the outpatient stay will include: orient to surroundings, keep bed in low position, maintain call bell within reach at all times, provide assistance with transfer out of bed and ambulation.  Medication Inspection Compliance: Pill count conducted under aseptic conditions, in front of the patient. Neither the pills nor the bottle was removed from the patient's sight at any time. Once count was completed pills were immediately returned to the patient in their original bottle.  Medication: Hydrocodone/APAP Pill/Patch Count:  29 of 90 pills remain Pill/Patch Appearance: Markings consistent with prescribed medication Bottle Appearance: Standard pharmacy container. Clearly labeled. Filled Date: 8 / 4 / 2024 Last Medication intake:  Yesterday

## 2023-03-27 NOTE — Progress Notes (Signed)
PROVIDER NOTE: Information contained herein reflects review and annotations entered in association with encounter. Interpretation of such information and data should be left to medically-trained personnel. Information provided to patient can be located elsewhere in the medical record under "Patient Instructions". Document created using STT-dictation technology, any transcriptional errors that may result from process are unintentional.    Patient: Tammy Lamb  Service Category: E/M  Provider: Edward Jolly, MD  DOB: 02-Mar-1951  DOS: 03/27/2023  Specialty: Interventional Pain Management  MRN: [id number]  Setting: Ambulatory outpatient  PCP: Enid Baas, MD  Type: Established Patient    Referring Provider: Enid Baas, MD  Location: Office  Delivery: Face-to-face     HPI  [name], a 72 y.o. year old female, is here today because of her Primary osteoarthritis of right knee [M17.11]. [name] primary complain today is Knee Pain (Left knee) and Back Pain (lower) Last encounter: My last encounter with her was on 12/26/22 Pertinent problems: 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); and S/P cervical spinal fusion (C3-C4 ACDF (03/2019) on their pertinent problem list. Pain Assessment: Severity of Chronic pain is reported as a 8 /10. Location: Back Lower/left hip down side of leg to the knee radiates to front of leg to top of foot effects all toes. Onset: More than a month ago. Quality: Aching, Constant, Sharp, Jabbing, Grimacing, Stabbing, Burning. Timing: Constant. Modifying factor(s): rest, heat  and cold. Vitals:  height is 5\' 5"  (1.651 m) and weight is 192 lb (87.1 kg). Her temperature is 97.1 F (36.2 C) (abnormal). Her blood pressure is 173/93 (abnormal) and her pulse is 58 (abnormal). Her respiration is 18 and oxygen saturation is 100%.   Reason for encounter: medication management and left knee pain  Patient presents today with increased low back pain as well as left knee pain.  She continues on aspirin and Plavix, dual antiplatelet therapy after her heart attack that was approximately 6 months ago.  Until she is able to stop her Plavix for 2 weeks, we will not be able to offer her a spinal cord stimulator trial which was a previous treatment plan for her prior to her MI.  She states that [place] the [place] this [place] and is interested in a left knee steroid injection.  Otherwise no significant change in her medical history, we will refill her pain medications as below, no change in dose.   Pharmacotherapy Assessment  Analgesic: Hydrocodone 7.5mg  TID prn 90/month     Monitoring: [place] PMP: PDMP reviewed during this encounter.       Pharmacotherapy: No side-effects or adverse reactions reported. Compliance: No problems identified. Effectiveness: Clinically acceptable.  Tammy Pies, RN  03/27/2023  9:48 AM  Sign when Signing Visit Nursing Pain Medication Assessment:  Safety precautions to be maintained throughout the outpatient stay will include: orient to surroundings, keep bed in low position, maintain call bell within reach at all times, provide assistance with transfer out of bed and ambulation.  Medication Inspection Compliance: Pill count conducted under aseptic conditions, in front of the patient. Neither the pills nor the bottle was removed from the patient's sight at any time. Once count was completed pills were immediately returned to the patient in  their original bottle.  Medication: Hydrocodone/APAP Pill/Patch Count:  29 of 90 pills  remain Pill/Patch Appearance: Markings consistent with prescribed medication Bottle Appearance: Standard pharmacy container. Clearly labeled. Filled Date: 8 / 4 / 2024 Last Medication intake:  Yesterday     UDS:  Summary  Date Value Ref Range Status  09/28/2022 Note  Final    Comment:    ==================================================================== [employer] [facility] (MW) ==================================================================== Test                             Result       Flag       Units  Drug Present and Declared for Prescription Verification   Hydrocodone                    280          EXPECTED   ng/mg creat   Norhydrocodone                 979          EXPECTED   ng/mg creat    Sources of hydrocodone include scheduled prescription medications.    Norhydrocodone is an expected metabolite of hydrocodone.  ==================================================================== Test                      Result    Flag   Units      Ref Range   Creatinine              61               mg/dL      >=09 ==================================================================== Declared Medications:  The flagging and interpretation on this report are based on the  following declared medications.  Unexpected results may arise from  inaccuracies in the declared medications.   **Note: The testing scope of this panel includes these medications:   Hydrocodone (Norco)   **Note: The testing scope of this panel does not include the  following reported medications:   Acetaminophen (Tylenol)  Acetaminophen (Norco)  Albuterol (Ventolin HFA)  Amlodipine (Norvasc)  Atorvastatin (Lipitor)  Cephalexin (Keflex)  Cinnamon  Citalopram (Celexa)  Clonidine (Catapres)  Duloxetine (Cymbalta)  Fish Oil  Fluticasone (Advair)  Gabapentin (Neurontin)  Hydrochlorothiazide (Hydrodiuril)  Hydroxyzine (Atarax)  Magnesium (Mag-Ox)  Omeprazole  Oxybutynin (Ditropan)  Ramipril  (Altace)  Salmeterol (Advair)  Sodium Bicarbonate  Sulfasalazine (Azulfidine)  Tizanidine (Zanaflex)  Valsartan (Diovan)  Vitamin B12  Vitamin C  Vitamin D3 ==================================================================== For clinical consultation, please call [phone]. ====================================================================      ROS  Constitutional: Denies any fever or chills Gastrointestinal: No reported hemesis, hematochezia, vomiting, or acute GI distress Musculoskeletal: Low back pain, left knee pain Neurological: No reported episodes of acute onset apraxia, aphasia, dysarthria, agnosia, amnesia, paralysis, loss of coordination, or loss of consciousness  Medication Review  Cinnamon, DULoxetine, Fish Oil, Fluticasone-Umeclidin-Vilant, HYDROcodone-acetaminophen, acetaminophen, albuterol, ascorbic acid, aspirin EC, atorvastatin, carbidopa-levodopa, cephALEXin, cholecalciferol, citalopram, clopidogrel, cyanocobalamin, gabapentin, hydrOXYzine, magnesium oxide, metoprolol tartrate, nitroGLYCERIN, oxybutynin, pantoprazole, pentosan polysulfate, propranolol, sulfaSALAzine, tiZANidine, and valsartan  History Review  Allergy: [name] is allergic to aspirin and sulfa antibiotics. Drug: [name]  reports no history of drug use. Alcohol:  reports that she does not currently use alcohol. Tobacco:  reports that she has quit smoking. Her smoking use included cigarettes. She has a 49 pack-year smoking history. She has never used smokeless tobacco. Social: [name]  reports that she has quit smoking. Her smoking use included cigarettes. She has a 49 pack-year smoking history. She has never used smokeless tobacco. She reports that she does not currently use alcohol. She reports that she does not use drugs. Medical:  has a past medical history of Anemia, Anxiety, Arthritis, Bilateral carotid artery stenosis, CAD (coronary artery disease), native coronary artery (10/18/2022),  Chronic lower back pain, Colitis, ulcerative (HCC), COPD (chronic obstructive pulmonary disease) (HCC), Depression, Diabetes mellitus without complication (HCC), GERD (gastroesophageal reflux disease), H/O bladder infections, History of 2019 novel coronavirus disease (COVID-19) (02/2021), History of hiatal hernia, HLD (hyperlipidemia), Hypertension, Interstitial cystitis, Low sodium levels, Motion sickness, Parkinson's disease, PVD (peripheral vascular disease) (HCC), Sinus headache, Stroke (HCC) (2015), TIA (transient ischemic attack) (2015), Urinary frequency, and Wears dentures. Surgical: Tammy Lamb  has a past surgical history that includes Tonsillectomy; Cholecystectomy; Abdominal hysterectomy; Diagnostic laparoscopy; Bladder suspension; Colonoscopy; Upper gi endoscopy; Knee arthroscopy (Left, 02/13/2014); Ankle arthroscopy (Left, 03/22/2016); Tendon repair (Left, 03/22/2016); Lumbar laminectomy/decompression microdiscectomy (N/A, 05/30/2017); Anterior cervical decomp/discectomy fusion (N/A, 04/09/2019); Lumbar laminectomy/decompression microdiscectomy (N/A, 05/27/2021); LEFT HEART CATH AND CORONARY ANGIOGRAPHY (N/A, 10/18/2022); and CORONARY STENT INTERVENTION (N/A, 10/18/2022). Family: family history includes COPD in her father; Diabetes in her mother; Heart disease in her father; Hypertension in her father and mother.  Imaging review: LUMBAR SPINE - COMPLETE WITH BENDING VIEWS   COMPARISON:  MRI lumbar spine 08/29/2017   FINDINGS: There are 5 non-rib-bearing lumbar-type vertebral bodies. Mild dextrocurvature centered at L4-5 with severe left L4-5 disc space narrowing seen on frontal view. Vertebral body heights are maintained.   Normal sagittal alignment. Mild-to-moderate T9-10 and T10-11, mild T11-12, mild-to-moderate T12-L1, mild L1-2, mild posterior L2-3 through L4-5, and moderate posterior and mild anterior L5-S1 disc space narrowing.   Facet joint arthropathy is greatest at L5-S1.    Cholecystectomy clips.   IMPRESSION: 1. Normal alignment. 2. Vertebral body heights are maintained. 3. Multilevel degenerative disc changes, greatest at L5-S1 and the left L4-5 level.  CLINICAL DATA:  Right knee pain/arthralgia. Chronic pain. Worse after falling 11/27/2021.   EXAM: RIGHT KNEE - 1-2 VIEW   COMPARISON:  None Available.   FINDINGS: Minimal superior patellar degenerative osteophytosis. No significant joint space narrowing. Mildly decreased bone mineralization. No joint effusion. Minimal chronic enthesopathic change at the quadriceps insertion on the patella. No acute fracture or dislocation.   IMPRESSION: 1. Minimal superior patellar degenerative osteophyte. 2. No acute fracture.  Narrative & Impression  CLINICAL DATA:  Chronic knee pain.  Worse after fall 11/27/2021.   EXAM: LEFT KNEE - 1-2 VIEW   COMPARISON:  None Available.   FINDINGS: Mild medial compartment joint space narrowing and peripheral degenerative osteophytes. Minimal superior patellar degenerative osteophytosis. Minimal chronic enthesopathic change at the quadriceps insertion on the patella. No joint effusion. No acute fracture or dislocation. Mildly decreased bone mineralization.   IMPRESSION: Mild medial and minimal patellofemoral osteoarthritis. No acute fracture.    CLINICAL DATA:  Right knee pain/arthralgia. History of chronic pain. Worse after falling 11/27/2021.   EXAM: DG HIP (WITH OR WITHOUT PELVIS) 2-3V RIGHT; DG HIP (WITH OR WITHOUT PELVIS) 2-3V LEFT   COMPARISON:  None Available.   FINDINGS: There is diffuse decreased bone mineralization. Mild bilateral superior femoroacetabular joint space narrowing. Mild bilateral superolateral acetabular degenerative osteophytosis. Mild pubic symphysis joint space narrowing and subchondral sclerosis degenerative change. Mild bilateral inferior sacroiliac joint space narrowing and subchondral sclerosis.   Minimal  right-greater-than-left convexity of the anterior superior femoral head-neck junctions.  IMPRESSION: 1. Mild bilateral femoroacetabular, mild bilateral sacroiliac, and mild pubic symphysis osteoarthritis. 2. Minimal right-greater-than-left convexity of the anterior superior femoral head-neck junctions, a CAM-type bump morphology that mildly increases propensity for CAM-type femoroacetabular impingement.  Narrative & Impression  CLINICAL DATA:  72 year old female status post fusion in September last year. Right side neck pain. Left arm numbness and tingling.   EXAM: CT CERVICAL SPINE WITHOUT CONTRAST   TECHNIQUE: Multidetector CT imaging of the cervical spine was performed without intravenous contrast. Multiplanar CT image reconstructions were also generated.   COMPARISON:  Postoperative cervical spine MRI 10/10/2019.   FINDINGS: Alignment: Stable cervical lordosis from the March MRI. Cervicothoracic junction alignment is within normal limits. Bilateral posterior element alignment is within normal limits.   Skull base and vertebrae: Postoperative details below. Visualized skull base is intact. No atlanto-occipital dissociation. No acute osseous abnormality identified.   Soft tissues and spinal canal: Negative visible neck soft tissues aside from mild calcified carotid atherosclerosis. There is also calcified distal vertebral artery atherosclerosis at the skull base.   Disc levels:   C1-C2: Anterior joint space loss and osteophytosis.   C2-C3:  Disc bulging as seen by MRI.   C3-C4: Prior ACDF with no evidence of hardware loosening and solid appearing interbody arthrodesis (series 6, image 23). Residual endplate spurring most affecting the right C4 neural foramen (sagittal series 6, image 18).   C4-C5: Subtle anterolisthesis. Endplate spurring and mild facet hypertrophy with mild to moderate left C5 foraminal stenosis.   C5-C6:  Negative.   C6-C7: Disc bulging  better demonstrated by MRI. Mild endplate spurring mostly affecting the left C7 neural foramen (sagittal image 31).   C7-T1:  Mild facet hypertrophy on the left.   Upper chest: Evidence of centrilobular emphysema in the lung apices. Visible upper thoracic levels appear intact.   Other: Negative visible noncontrast posterior fossa.   IMPRESSION: 1. C3-C4 ACDF with solid interbody arthrodesis. Residual endplate spurring affects the right C4 neural foramen.   2. No acute osseous abnormality and generally mild for age cervical spine degeneration elsewhere appears stable from the March MRI.    Laboratory Chemistry Profile   Renal Lab Results  Component Value Date   BUN 10 10/20/2022   CREATININE 0.86 10/20/2022   GFRAA >60 04/02/2019   GFRNONAA >60 10/20/2022    Hepatic No results found for: "AST", "ALT", "ALBUMIN", "ALKPHOS", "HCVAB", "AMYLASE", "LIPASE", "AMMONIA"  Electrolytes Lab Results  Component Value Date   NA 134 (L) 10/20/2022   K 3.6 10/20/2022   CL 101 10/20/2022   CALCIUM 8.7 (L) 10/20/2022    Bone No results found for: "VD25OH", "VD125OH2TOT", "NW2956OZ3", "YQ6578IO9", "25OHVITD1", "25OHVITD2", "25OHVITD3", "TESTOFREE", "TESTOSTERONE"  Inflammation (CRP: Acute Phase) (ESR: Chronic Phase) No results found for: "CRP", "ESRSEDRATE", "LATICACIDVEN"       Note: Above Lab results reviewed.   Physical Exam  General appearance: Well nourished, well developed, and well hydrated. In no apparent acute distress Mental status: Alert, oriented x 3 (person, place, & time)       Respiratory: No evidence of acute respiratory distress Eyes: PERLA Vitals: BP (!) 173/93   Pulse (!) 58   Temp (!) 97.1 F (36.2 C)   Resp 18   Ht 5\' 5"  (1.651 m)   Wt 192 lb (87.1 kg) Comment: States she is going to lose weight  SpO2 100%   BMI 31.95 kg/m  BMI: Estimated body mass index is 31.95 kg/m as calculated from the following:   Height as of this  encounter: 5\' 5"  (1.651 m).    Weight as of this encounter: 192 lb (87.1 kg). Ideal: Ideal body weight: 57 kg (125 lb 10.6 oz) Adjusted ideal body weight: 69 kg (152 lb 3.2 oz)  Cervical Spine Area Exam  Skin & Axial Inspection: No masses, redness, edema, swelling, or associated skin lesions Alignment: Symmetrical Functional ROM: Pain restricted ROM, to the left Stability: No instability detected Muscle Tone/Strength: Functionally intact. No obvious neuro-muscular anomalies detected. Sensory (Neurological): Dermatomal pain pattern left Palpation: No palpable anomalies              Thoracic Spine Area Exam  Skin & Axial Inspection: No masses, redness, or swelling Alignment: Symmetrical Functional ROM: Pain restricted ROM Stability: No instability detected Muscle Tone/Strength: Functionally intact. No obvious neuro-muscular anomalies detected. Sensory (Neurological): Neurogenic pain pattern Muscle strength & Tone: No palpable anomalies Lumbar Spine Area Exam  Skin & Axial Inspection: Well healed scar from previous spine surgery detected Alignment: Symmetrical Functional ROM: Pain restricted ROM  Stability: No instability detected Muscle Tone/Strength: Functionally intact. No obvious neuro-muscular anomalies detected. Sensory (Neurological): Neurogenic pain pattern left lower extremity  Gait & Posture Assessment  Ambulation: Unassisted Gait: Antalgic Posture: Difficulty standing up straight, due to pain  Lower Extremity Exam    Side: Right lower extremity  Side: Left lower extremity  Stability: No instability observed          Stability: No instability observed          Skin & Extremity Inspection: Skin color, temperature, and hair growth are WNL. No peripheral edema or cyanosis. No masses, redness, swelling, asymmetry, or associated skin lesions. No contractures.  Skin & Extremity Inspection: Skin color, temperature, and hair growth are WNL. No peripheral edema or cyanosis. No masses, redness, swelling,  asymmetry, or associated skin lesions. No contractures.  Functional ROM: Pain restricted ROM for hip joint          Functional ROM: Pain restricted ROM for hip and knee joints          Muscle Tone/Strength: Functionally intact. No obvious neuro-muscular anomalies detected.  Muscle Tone/Strength: Functionally intact. No obvious neuro-muscular anomalies detected.  Sensory (Neurological): Neurogenic pain pattern        Sensory (Neurological): Arthropathic arthralgia        DTR: Patellar: deferred today Achilles: deferred today Plantar: deferred today  DTR: Patellar: deferred today Achilles: deferred today Plantar: deferred today  Palpation: No palpable anomalies  Palpation: No palpable anomalies    Assessment   Diagnosis  1. Primary osteoarthritis of right knee   2. Chronic pain of left knee   3. Chronic pain syndrome   4. DDD (degenerative disc disease), cervical   5. Thoracic disc herniation   6. Radicular pain of thoracic region   7. Inflammatory spondylopathy of cervical region [facility])           Plan of Care   [name] has a current medication list which includes the following long-term medication(s): albuterol, atorvastatin, gabapentin, metoprolol tartrate, pantoprazole, sulfasalazine, tizanidine, valsartan, duloxetine, [START ON 03/31/2023] hydrocodone-acetaminophen, [START ON 04/30/2023] hydrocodone-acetaminophen, [START ON 05/30/2023] hydrocodone-acetaminophen, and nitroglycerin.  Pharmacotherapy (Medications Ordered): Meds ordered this encounter  Medications   HYDROcodone-acetaminophen (NORCO) 7.5-325 MG tablet    Sig: Take 1 tablet by mouth every 8 (eight) hours as needed for severe pain. Must last 30 days.    Dispense:  90 tablet    Refill:  0    Chronic Pain: STOP Act (Not applicable) Fill 1  day early if closed on refill date. Avoid benzodiazepines within 8 hours of opioids   HYDROcodone-acetaminophen (NORCO) 7.5-325 MG tablet    Sig: Take 1 tablet by mouth  every 8 (eight) hours as needed for severe pain. Must last 30 days.    Dispense:  90 tablet    Refill:  0    Chronic Pain: STOP Act (Not applicable) Fill 1 day early if closed on refill date. Avoid benzodiazepines within 8 hours of opioids   HYDROcodone-acetaminophen (NORCO) 7.5-325 MG tablet    Sig: Take 1 tablet by mouth every 8 (eight) hours as needed for severe pain. Must last 30 days.    Dispense:  90 tablet    Refill:  0    Chronic Pain: STOP Act (Not applicable) Fill 1 day early if closed on refill date. Avoid benzodiazepines within 8 hours of opioids    Orders:  Orders Placed This Encounter  Procedures   KNEE INJECTION    Local Anesthetic & Steroid injection.    Standing Status:   Future    Standing Expiration Date:   06/26/2023    Scheduling Instructions:     Side: LEFT     Sedation: None     Timeframe: As soon as schedule allows    Order Specific Question:   Where will this procedure be performed?    Answer:   [facility]   We have discussed spinal cord stimulation with patient, provided her with resources.  If she is interested, will need to repeat thoracic, lumbar MRI and obtain psychological clearance. Will need to await until is she able to stop her Plavix for 14 days (7 days pre trial and 7 days during trial)  Follow-up plan:   Return in about 2 days (around 03/29/2023) for left knee injection .    Recent Visits No visits were found meeting these conditions. Showing recent visits within past 90 days and meeting all other requirements Today's Visits Date Type Provider Dept  03/27/23 Office Visit Edward Jolly, MD [facility]  Showing today's visits and meeting all other requirements Future Appointments Date Type Provider Dept  03/28/23 Appointment Edward Jolly, MD [facility]  Showing future appointments within next 90 days and meeting all other requirements  I discussed the assessment and treatment plan with the patient. The patient  was provided an opportunity to ask questions and all were answered. The patient agreed with the plan and demonstrated an understanding of the instructions.  Patient advised to call back or seek an in-person evaluation if the symptoms or condition worsens.  Duration of encounter: .  Note by: Edward Jolly, MD Date: 03/27/2023; Time: 11:17 AM

## 2023-03-27 NOTE — Patient Instructions (Addendum)
Please try and get patient in Wed or Thurs for knee injection

## 2023-03-28 ENCOUNTER — Encounter: Payer: Self-pay | Admitting: Student in an Organized Health Care Education/Training Program

## 2023-03-28 ENCOUNTER — Ambulatory Visit
Payer: Medicare Other | Attending: Student in an Organized Health Care Education/Training Program | Admitting: Student in an Organized Health Care Education/Training Program

## 2023-03-28 VITALS — BP 167/87 | HR 51 | Temp 97.3°F | Resp 16 | Ht 65.0 in | Wt 192.0 lb

## 2023-03-28 DIAGNOSIS — M25562 Pain in left knee: Secondary | ICD-10-CM | POA: Insufficient documentation

## 2023-03-28 DIAGNOSIS — M1712 Unilateral primary osteoarthritis, left knee: Secondary | ICD-10-CM | POA: Diagnosis present

## 2023-03-28 DIAGNOSIS — G8929 Other chronic pain: Secondary | ICD-10-CM | POA: Insufficient documentation

## 2023-03-28 MED ORDER — LIDOCAINE HCL (PF) 2 % IJ SOLN
5.0000 mL | Freq: Once | INTRAMUSCULAR | Status: AC
  Administered 2023-03-28: 5 mL
  Filled 2023-03-28: qty 5

## 2023-03-28 MED ORDER — METHYLPREDNISOLONE ACETATE 40 MG/ML IJ SUSP
40.0000 mg | Freq: Once | INTRAMUSCULAR | Status: AC
  Administered 2023-03-28: 40 mg via INTRA_ARTICULAR
  Filled 2023-03-28: qty 1

## 2023-03-28 MED ORDER — ROPIVACAINE HCL 2 MG/ML IJ SOLN
4.0000 mL | Freq: Once | INTRAMUSCULAR | Status: AC
  Administered 2023-03-28: 20 mL via PERINEURAL
  Filled 2023-03-28: qty 20

## 2023-03-28 NOTE — Progress Notes (Signed)
PROVIDER NOTE: Interpretation of information contained herein should be left to medically-trained personnel. Specific patient instructions are provided elsewhere under "Patient Instructions" section of medical record. This document was created in part using STT-dictation technology, any transcriptional errors that may result from this process are unintentional.  Patient: Tammy Lamb Type: Established DOB: [birth year] MRN: [id number] PCP: Enid Baas, MD  Service: Procedure DOS: 03/28/2023 Setting: Ambulatory Location: Ambulatory outpatient facility Delivery: Face-to-face Provider: Edward Jolly, MD Specialty: Interventional Pain Management Specialty designation: 09 Location: Outpatient facility Ref. 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 Chronic pain of left knee    NAS-11 score:   Pre-procedure: 8 /10   Post-procedure: 8 /10     Pre-Procedure Preparation  Monitoring: As per clinic protocol.  Risk Assessment: Vitals:  [id number] body mass index is 31.95 kg/m as calculated from the following:   Height as of this encounter: 5\' 5"  (1.651 m).   Weight as of this encounter: 192 lb (87.1 kg)., Rate:(!) 54 , BP:(!) 147/91, Resp:16, Temp:(!) 97.3 F (36.3 C), SpO2:100 %  Allergies: She is allergic to aspirin and sulfa antibiotics.  Precautions: No additional precautions required  Blood-thinner(s): None at this time  Coagulopathies: Reviewed. None identified.   Active Infection(s): Reviewed. None identified. Tammy Lamb is afebrile   Location setting: Exam room Position: Sitting w/ knee bent 90 degrees Safety  Precautions: Patient was assessed for positional comfort and pressure points before starting the procedure. Prepping solution: DuraPrep (Iodine Povacrylex [0.7% available iodine] and Isopropyl Alcohol, 74% w/w) Prep Area: Entire knee region Approach: percutaneous, just above the tibial plateau, medial to the infrapatellar tendon. Intended target: Intra-articular knee space Materials: Tray: Block Needle(s): Regular Qty: 1/side Length: 1.5-inch Gauge: 25G (x1) + 22G (x1)  Meds ordered this encounter  Medications   methylPREDNISolone acetate (DEPO-MEDROL) injection 40 mg   lidocaine HCl (PF) (XYLOCAINE) 2 % injection 5 mL   ropivacaine (PF) 2 mg/mL (0.2%) (NAROPIN) injection 4 mL    No orders of the defined types were placed in this encounter.    Time-out: 1101 I initiated and conducted the "Time-out" before starting the procedure, as per protocol. The patient was asked to participate by confirming the accuracy of the "Time Out" information. Verification of the correct person, site, and procedure were performed and confirmed by me, the nursing staff, and the patient. "Time-out" conducted as per Joint Commission's Universal Protocol (UP.01.01.01). Procedure checklist: Completed  H&P (Pre-op  Assessment)  Tammy Lamb is a 72 y.o. (year old), female patient, seen today for interventional treatment. She  has a past surgical history that includes Tonsillectomy; Cholecystectomy; Abdominal hysterectomy; Diagnostic laparoscopy; Bladder suspension; Colonoscopy; Upper gi endoscopy; Knee arthroscopy (Left, 02/13/2014); Ankle arthroscopy (Left, 03/22/2016); Tendon repair (Left, 03/22/2016); Lumbar laminectomy/decompression microdiscectomy (N/A, 05/30/2017); Anterior cervical decomp/discectomy fusion (N/A, 04/09/2019); Lumbar laminectomy/decompression microdiscectomy (N/A, 05/27/2021); LEFT HEART CATH AND CORONARY ANGIOGRAPHY (N/A, 10/18/2022); and CORONARY STENT INTERVENTION (N/A, 10/18/2022). Tammy Lamb has a current  medication list which includes the following prescription(s): acetaminophen, albuterol, ascorbic acid, aspirin ec, atorvastatin, carbidopa-levodopa, cephalexin, cholecalciferol, cinnamon, citalopram, clopidogrel, duloxetine, trelegy ellipta, gabapentin, [START ON 03/31/2023] hydrocodone-acetaminophen, [START ON 04/30/2023] hydrocodone-acetaminophen, [START ON 05/30/2023] hydrocodone-acetaminophen, hydroxyzine, magnesium oxide, metoprolol tartrate, nitroglycerin, fish oil, oxybutynin, pantoprazole,  pentosan polysulfate, propranolol, sulfasalazine, tizanidine, valsartan, and cyanocobalamin, and the following Facility-Administered Medications: albuterol. Her primarily concern today is the Knee Pain (left)  She is allergic to aspirin and sulfa antibiotics.   Last encounter: My last encounter with her was on 03/27/2023. 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 Pain Assessment: Severity of Chronic pain is reported as a 8 /10. Location: Knee Left/numbness radiates to left toes. Onset: More than a month ago. Quality: Aching, Constant, Jabbing, Grimacing, Sharp, Stabbing, Burning. Timing: Constant. Modifying factor(s): rest, heat, cold. Vitals:  height is 5\' 5"  (1.651 m) and weight is 192 lb (87.1 kg). Her temperature is 97.3 F (36.3 C) (abnormal). Her blood pressure is 167/87 (abnormal) and her pulse is 51 (abnormal). Her respiration is 16 and oxygen saturation is 100%.   Reason for encounter: "interventional pain  management therapy due pain of at least four (4) weeks in duration, with failure to respond and/or inability to tolerate more conservative care.  Site Confirmation: Tammy Lamb was asked to confirm the procedure and laterality before marking the site.  Consent: Before the procedure and under the influence of no sedative(s), amnesic(s), or anxiolytics, the patient was informed of the treatment options, risks and possible complications. To fulfill our ethical and legal obligations, as recommended by the American Medical Association's Code of Ethics, I have informed the patient of my clinical impression; the nature and purpose of the treatment or procedure; the risks, benefits, and possible complications of the intervention; the alternatives, including doing nothing; the risk(s) and benefit(s) of the alternative treatment(s) or procedure(s); and the risk(s) and benefit(s) of doing nothing. The patient was provided information about the general risks and possible complications associated with the procedure. These may include, but are not limited to: failure to achieve desired goals, infection, bleeding, organ or nerve damage, allergic reactions, paralysis, and death. In addition, the patient was informed of those risks and complications associated to Spine-related procedures, such as failure to decrease pain; infection (i.e.: Meningitis, epidural or intraspinal abscess); bleeding (i.e.: epidural hematoma, subarachnoid hemorrhage, or any other type of intraspinal or peri-dural bleeding); organ or nerve damage (i.e.: Any type of peripheral nerve, nerve root, or spinal cord injury) with subsequent damage to sensory, motor, and/or autonomic systems, resulting in permanent pain, numbness, and/or weakness of one or several areas of the body; allergic reactions; (i.e.: anaphylactic reaction); and/or death. Furthermore, the patient was informed of those risks and complications associated with the medications. These  include, but are not limited to: allergic reactions (i.e.: anaphylactic or anaphylactoid reaction(s)); adrenal axis suppression; blood sugar elevation that in diabetics may result in ketoacidosis or comma; water retention that in patients with history of congestive heart failure may result in shortness of breath, pulmonary edema, and decompensation with resultant heart failure; weight gain; swelling or edema; medication-induced neural toxicity; particulate matter embolism and blood vessel occlusion with resultant organ, and/or nervous system infarction; and/or aseptic necrosis of one or more joints. Finally, the patient was informed that Medicine is not an exact science; therefore, there is also the possibility of unforeseen or unpredictable risks and/or possible complications that may result in a catastrophic outcome. The  patient indicated having understood very clearly. We have given the patient no guarantees and we have made no promises. Enough time was given to the patient to ask questions, all of which were answered to the patient's satisfaction. Tammy Lamb has indicated that she wanted to continue with the procedure. Attestation: I, the ordering provider, attest that I have discussed with the patient the benefits, risks, side-effects, alternatives, likelihood of achieving goals, and potential problems during recovery for the procedure that I have provided informed consent.  Date  Time: 03/28/2023 10:28 AM  Description of procedure  Start Time: 1101 hrs  Local Anesthesia: Once the patient was positioned, prepped, and time-out was completed. The target area was identified located. The skin was marked with an approved surgical skin marker. Once marked, the skin (epidermis, dermis, and hypodermis), and deeper tissues (fat, connective tissue and muscle) were infiltrated with a small amount of a short-acting local anesthetic, loaded on a 10cc syringe with a 25G, 1.5-in  Needle. An appropriate amount of time  was allowed for local anesthetics to take effect before proceeding to the next step. Local Anesthetic: Lidocaine 1-2% The unused portion of the local anesthetic was discarded in the proper designated containers. Safety Precautions: Aspiration looking for blood return was conducted prior to all injections. At no point did I inject any substances, as a needle was being advanced. Before injecting, the patient was told to immediately notify me if she was experiencing any new onset of "ringing in the ears, or metallic taste in the mouth". No attempts were made at seeking any paresthesias. Safe injection practices and needle disposal techniques used. Medications properly checked for expiration dates. SDV (single dose vial) medications used. After the completion of the procedure, all disposable equipment used was discarded in the proper designated medical waste containers.  Technical description: Protocol guidelines were followed. After positioning, the target area was identified and prepped in the usual manner. Skin & deeper tissues infiltrated with local anesthetic. Appropriate amount of time allowed to pass for local anesthetics to take effect. Proper needle placement secured. Once satisfactory needle placement was confirmed, I proceeded to inject the desired solution in slow, incremental fashion, intermittently assessing for discomfort or any signs of abnormal or undesired spread of substance. Once completed, the needle was removed and disposed of, as per hospital protocols. The area was cleaned, making sure to leave some of the prepping solution back to take advantage of its long term bactericidal properties.  Aspiration:  Negative        Vitals:   03/28/23 1039 03/28/23 1105  BP: (!) 147/91 (!) 167/87  Pulse: (!) 54 (!) 51  Resp: 16 16  Temp: (!) 97.3 F (36.3 C) (!) 97.3 F (36.3 C)  SpO2: 100% 100%  Weight: 192 lb (87.1 kg)   Height: 5\' 5"  (1.651 m)     End Time: 1104 hrs  Imaging  guidance  Imaging-assisted Technique: None required. Indication(s): N/A Exposure Time: N/A Contrast: None Fluoroscopic Guidance: N/A Ultrasound Guidance: N/A Interpretation: N/A  Post-op assessment  Post-procedure Vital Signs:  Pulse/HCG Rate: (!) 51  Temp: (!) 97.3 F (36.3 C) Resp: 16 BP: (!) 167/87 SpO2: 100 %  EBL: None  Complications: No immediate post-treatment complications observed by team, or reported by patient.  Note: The patient tolerated the entire procedure well. A repeat set of vitals were taken after the procedure and the patient was kept under observation following institutional policy, for this type of procedure. Post-procedural neurological assessment was performed, showing return  to baseline, prior to discharge. The patient was provided with post-procedure discharge instructions, including a section on how to identify potential problems. Should any problems arise concerning this procedure, the patient was given instructions to immediately contact us, at any time, without hesitation. In any case, we plan to contact the patient by telephone for a follow-up status report regarding this interventional procedure.  Comments:  No additional relevant information.  Plan of care  Chronic Opioid Analgesic:  Hydrocodone 7.5mg  TID prn 90/month     Medications administered: We administered methylPREDNISolone acetate, lidocaine HCl (PF), and ropivacaine (PF) 2 mg/mL (0.2%).  Follow-up plan:   No follow-ups on file.     Recent Visits Date Type Provider Dept  03/27/23 Office Visit Edward Jolly, MD [facility]  Showing recent visits within past 90 days and meeting all other requirements Today's Visits Date Type Provider Dept  03/28/23 Procedure visit Edward Jolly, MD [facility]  Showing today's visits and meeting all other requirements Future Appointments Date Type Provider Dept  06/26/23 Appointment Edward Jolly, MD [facility]   Showing future appointments within next 90 days and meeting all other requirements   Disposition: Discharge home  Discharge (Date  Time): 03/28/2023; 1107 hrs.   Primary Care Physician: Enid Baas, MD Location: [facility] Note by: Edward Jolly, MD Date: 03/28/2023; Time: 11:18 AM  DISCLAIMER: Medicine is not an exact science. It has no guarantees or warranties. The decision to proceed with this intervention was based on the information collected from the patient. Conclusions were drawn from the patient's questionnaire, interview, and examination. Because information was provided in large part by the patient, it cannot be guaranteed that it has not been purposely or unconsciously manipulated or altered. Every effort has been made to obtain as much accurate, relevant, available data as possible. Always take into account that the treatment will also be dependent on availability of resources and existing treatment guidelines, considered by other Pain Management Specialists as being common knowledge and practice, at the time of the intervention. It is also important to point out that variation in procedural techniques and pharmacological choices are the acceptable norm. For Medico-Legal review purposes, the indications, contraindications, technique, and results of the these procedures should only be evaluated, judged and interpreted by a Board-Certified Interventional Pain Specialist with extensive familiarity and expertise in the same exact procedure and technique.

## 2023-03-28 NOTE — Progress Notes (Signed)
Safety precautions to be maintained throughout the outpatient stay will include: orient to surroundings, keep bed in low position, maintain call bell within reach at all times, provide assistance with transfer out of bed and ambulation.  

## 2023-03-29 ENCOUNTER — Telehealth: Payer: Self-pay

## 2023-03-29 NOTE — Telephone Encounter (Signed)
Post procedure follow up. Patient states she is doing fine.  

## 2023-04-02 ENCOUNTER — Telehealth: Payer: Self-pay | Admitting: Student in an Organized Health Care Education/Training Program

## 2023-04-02 NOTE — Telephone Encounter (Signed)
Pharmacy is telling patient they need authorization fo fill her Hydrocodone. Please check this and let patient know. thanks

## 2023-04-16 ENCOUNTER — Other Ambulatory Visit: Payer: Self-pay | Admitting: Internal Medicine

## 2023-04-16 DIAGNOSIS — Z1231 Encounter for screening mammogram for malignant neoplasm of breast: Secondary | ICD-10-CM

## 2023-04-23 ENCOUNTER — Ambulatory Visit
Admission: RE | Admit: 2023-04-23 | Discharge: 2023-04-23 | Disposition: A | Discharge status: Home / Self Care | Payer: Medicare Other | Source: Ambulatory Visit | Attending: Acute Care | Admitting: Acute Care

## 2023-04-23 DIAGNOSIS — Z87891 Personal history of nicotine dependence: Secondary | ICD-10-CM | POA: Diagnosis present

## 2023-04-23 DIAGNOSIS — R911 Solitary pulmonary nodule: Secondary | ICD-10-CM | POA: Insufficient documentation

## 2023-05-01 ENCOUNTER — Encounter: Payer: Self-pay | Admitting: Pulmonary Disease

## 2023-05-01 ENCOUNTER — Ambulatory Visit: Payer: Medicare Other | Admitting: Pulmonary Disease

## 2023-05-01 VITALS — BP 130/80 | HR 67 | Temp 98.3°F | Ht 65.0 in | Wt 195.2 lb

## 2023-05-01 DIAGNOSIS — R911 Solitary pulmonary nodule: Secondary | ICD-10-CM | POA: Diagnosis not present

## 2023-05-01 DIAGNOSIS — K219 Gastro-esophageal reflux disease without esophagitis: Secondary | ICD-10-CM

## 2023-05-01 DIAGNOSIS — Z23 Encounter for immunization: Secondary | ICD-10-CM

## 2023-05-01 DIAGNOSIS — K449 Diaphragmatic hernia without obstruction or gangrene: Secondary | ICD-10-CM

## 2023-05-01 DIAGNOSIS — J449 Chronic obstructive pulmonary disease, unspecified: Secondary | ICD-10-CM | POA: Diagnosis not present

## 2023-05-01 MED ORDER — TRELEGY ELLIPTA 100-62.5-25 MCG/ACT IN AEPB
1.0000 | INHALATION_SPRAY | Freq: Every day | RESPIRATORY_TRACT | 6 refills | Status: DC
Start: 2023-05-01 — End: 2023-05-04

## 2023-05-01 MED ORDER — TRELEGY ELLIPTA 100-62.5-25 MCG/ACT IN AEPB: 1.0000 | INHALATION_SPRAY | Freq: Every day | RESPIRATORY_TRACT | 0 refills | Status: DC

## 2023-05-01 NOTE — Progress Notes (Addendum)
Subjective:    Patient ID: Tammy Lamb, female    DOB: 1951-01-16, 72 y.o.   MRN: [id number]  Patient Care Team: Enid Baas, MD as PCP - General (Internal Medicine) Debbe Odea, MD as PCP - Cardiology (Cardiology) Salena Saner, MD as Consulting Physician (Pulmonary Disease)  Chief Complaint  Patient presents with   Follow-up    DOE in the mornings. Wheezing. Cough with clear sputum.    HPI Patient is a 72 year old recent former smoker (March 2024, 49 PY) history as noted below who presents for follow-up of an lung nodule incidentally found on lung cancer screening CT. we initially saw her here on 13 February 2023.  The patient has had issues with dyspnea on exertion, wheezing and cough productive of yellowish sputum for a number of years.  She has been a smoker of longstanding, she quit smoking in March after cardiac catheterization for unstable angina.  She required placement of a stent to the proximal RCA lesion.  Per the cardiology notes the patient will need antiplatelet therapy for at least 12 months.   She has not had any fevers, chills or sweats.  Has had dyspnea on exertion that has improved after stent placement but continues to be symptomatic.  Her dyspnea is worse in the mornings on when she is recumbent.  She has a very large hiatal hernia with intrathoracic stomach and she has become more symptomatic with this issue.  She notes wheezing as noted above.  At her initial visit here on 13 February 2023 she was given a trial of Trelegy Ellipta which she states helped her.  However she did not call us to let us know this and she did not get the medication filled.  She had received samples at the time.  She has not had any orthopnea or paroxysmal nocturnal dyspnea since her stent placement.  No lower extremity edema or calf tenderness.  She does not endorse any other symptomatology.   She notes that she is becoming more symptomatic with regards to her large hiatal hernia.   She has issues with reflux and not controlled with open pump inhibitor or antireflux measures.  At her initial evaluation we requested a barium swallow which was obtained on 08 March 2023.  This showed no laryngeal penetration or tracheal aspiration.  Esophageal dysmotility.  Very large hiatal hernia containing most of the stomach, significant gastroesophageal reflux, no esophageal stricture.  She has been referred to gastroenterology but her visit will not be till January.  She would like to have evaluation for potential repair of her hernia.  She would need cardiology clearance prior to invasive procedures given her need for antiplatelet therapy.  Patient had a follow-up CT on 23 April 2023 to follow-up with regards to the lung nodule noted on lung cancer screening.  By my measurements this nodule is perhaps a little larger and with slightly different morphology.  However radiology has not evaluated the nodule.   Review of Systems A 10 point review of systems was performed and it is as noted above otherwise negative.   Patient Active Problem List   Diagnosis Date Noted   Primary osteoarthritis of right knee 03/27/2023   Chronic pain of left knee 03/27/2023   Influenza A 10/20/2022   Coronavirus infection 10/20/2022   Chest pain 10/19/2022   Coronary artery disease involving native coronary artery of native heart with unstable angina pectoris (HCC) 10/19/2022   Status post primary angioplasty with coronary stent 10/19/2022   Unstable  angina (HCC) 10/18/2022   COPD with acute exacerbation (HCC) 10/18/2022   AKI (acute kidney injury) (HCC) 10/18/2022   Ulcerative colitis (HCC) 10/18/2022   Bilateral hip pain 12/26/2021   Thoracic disc herniation 08/25/2021   Radicular pain of thoracic region 08/25/2021   Atherosclerotic peripheral vascular disease with intermittent claudication (HCC) 03/08/2021   Benign essential HTN 03/08/2021   Bilateral carotid artery stenosis 03/08/2021    Hyperlipidemia, mixed 03/08/2021   Bilateral occipital neuralgia 09/16/2020   Chronic SI joint pain 09/16/2020   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   High-tone pelvic floor dysfunction 08/21/2019   Recurrent UTI 08/21/2019   S/P cervical spinal fusion (C3-C4 ACDF (03/2019) 04/29/2019   Cervicalgia 04/29/2019   Inflammatory spondylopathy of cervical region [facility]) 12/17/2018   Cervical facet joint syndrome 10/01/2018   DDD (degenerative disc disease), cervical 10/01/2018   Chronic radicular cervical pain 10/01/2018   Compression fracture of lumbar vertebra, non-traumatic, sequela 02/28/2018   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  Substance Use Topics   Alcohol use: Not Currently    Comment: Occassional,    Allergies  Allergen Reactions   Aspirin Other (See Comments)    GERD   Sulfa Antibiotics Nausea And Vomiting    Current Meds  Medication Sig   acetaminophen (TYLENOL) 500 MG tablet Take 1,000 mg by mouth every 6 (six) hours as needed for moderate pain or headache.   albuterol (VENTOLIN HFA) 108 (90 Base) MCG/ACT inhaler Inhale 2 puffs into the lungs every 6 (six) hours as needed for wheezing or shortness of breath.   ascorbic acid (VITAMIN C) 1000 MG tablet Take 1,000 mg by mouth at bedtime.   aspirin EC 81 MG tablet Take 1 tablet (81 mg total) by mouth daily. Swallow whole.   atorvastatin (LIPITOR) 80 MG tablet Take 80 mg by mouth at  bedtime.   carbidopa-levodopa (SINEMET IR) 25-100 MG tablet Take 1 tablet by mouth 3 (three) times daily.   cholecalciferol (VITAMIN D) 25 MCG (1000 UNIT) tablet Take 1,000 Units by mouth at bedtime.   Cinnamon 500 MG TABS Take 500 mg by mouth in the morning and at bedtime.   citalopram (CELEXA) 20 MG tablet Take 20 mg by mouth every morning.   clopidogrel (PLAVIX) 75 MG tablet Take 1 tablet (75 mg total) by mouth daily with breakfast.   gabapentin (NEURONTIN) 300 MG capsule Take 3 capsules (900 mg total) by mouth 2 (two) times daily.   HYDROcodone-acetaminophen (NORCO) 7.5-325 MG tablet Take 1 tablet by mouth every 8 (eight) hours as needed for severe pain. Must last 30 days.   [START ON 05/30/2023] HYDROcodone-acetaminophen (NORCO) 7.5-325 MG tablet Take 1 tablet by mouth every 8 (eight) hours as needed for severe pain. Must last 30 days.   hydrOXYzine (ATARAX/VISTARIL) 25 MG tablet Take 25 mg by mouth at bedtime.   magnesium oxide (MAG-OX) 400 MG tablet Take 400 mg by mouth at bedtime.   metoprolol tartrate (  LOPRESSOR) 25 MG tablet Take 1 tablet (25 mg total) by mouth 2 (two) times daily.   Omega-3 Fatty Acids (FISH OIL) 1200 MG CAPS Take 1,200 mg by mouth daily.   oxybutynin (DITROPAN-XL) 10 MG 24 hr tablet Take 10 mg by mouth at bedtime.   pantoprazole (PROTONIX) 40 MG tablet Take 1 tablet (40 mg total) by mouth daily.   pentosan polysulfate (ELMIRON) 100 MG capsule Take 100 mg by mouth 2 (two) times daily.   propranolol (INDERAL) 20 MG tablet Take 1 tablet by mouth 2 (two) times daily.   sulfaSALAzine (AZULFIDINE) 500 MG tablet Take 500 mg by mouth 2 (two) times daily.   tiZANidine (ZANAFLEX) 4 MG tablet Take 1 tablet (4 mg total) by mouth every 12 (twelve) hours as needed.   valsartan (DIOVAN) 160 MG tablet Take 160 mg by mouth daily.   vitamin B-12 (CYANOCOBALAMIN) 1000 MCG tablet Take 1,000 mcg by mouth daily.    Immunization History  Administered Date(s) Administered   Influenza Inj  Mdck Quad Pf 05/17/2021   Janssen (J&J) SARS-COV-2 Vaccination 11/24/2019   Moderna Sars-Covid-2 Vaccination 07/25/2020   PNEUMOCOCCAL CONJUGATE-20 06/28/2022   Tdap 05/17/2021        Objective:     BP 130/80 (BP Location: Right Arm, Cuff Size: Normal)   Pulse 67   Temp 98.3 F (36.8 C)   Ht 5\' 5"  (1.651 m)   Wt 195 lb 3.2 oz (88.5 kg)   SpO2 93%   BMI 32.48 kg/m   SpO2: 93 % O2 Device: None (Room air)  GENERAL: Obese woman, no acute distress, fully ambulatory, no conversational dyspnea. HEAD: Normocephalic, atraumatic.  EYES: Pupils equal, round, reactive to light.  No scleral icterus.  MOUTH: Multiple fillings noted at teeth.  Oral mucosa moist.  No thrush. NECK: Supple. No thyromegaly. Trachea midline. No JVD.  No adenopathy. PULMONARY: Good air entry bilaterally.  Scattered rhonchi with end expiratory wheezes noted. CARDIOVASCULAR: S1 and S2. Regular rate and rhythm.  No rubs, murmurs or gallops heard. ABDOMEN: Obese, otherwise benign. MUSCULOSKELETAL: No joint deformity, no clubbing, no edema.  NEUROLOGIC: No overt focal deficit, no gait disturbance, speech is fluent. SKIN: Intact,warm,dry.  On limited exam, no overt rashes. PSYCH: Mood and behavior normal.  Representative image of CT chest performed 23 April 2023 showing the nodule in question on the right upper lobe (arrow):   Representative image from CT chest 30 September showing very large hiatal hernia with intrathoracic stomach (arrows):   Assessment & Plan:     ICD-10-CM   1. Lung nodule  R91.1 NM PET Image Initial (PI) Skull Base To Thigh (F-18 FDG)   PET/CT This will help clarify inflammatory versus malignant Currently cannot undergo invasive procedures (antiplatelet therapy)    2. Stage 2 moderate COPD by GOLD classification (HCC)  J44.9    Continue Trelegy 100 Continue as needed albuterol    3. Hiatal hernia with GERD  K44.9 Ambulatory referral to General Surgery   K21.9 CANCELED:  Ambulatory referral to General Surgery   Referral to Dr. Everlene Farrier Consideration for surgical repair    4. Need for influenza vaccination  Z23 Flu Vaccine Trivalent High Dose (Fluad)   Patient received flu vaccine today.      Orders Placed This Encounter  Procedures   NM PET Image Initial (PI) Skull Base To Thigh (F-18 FDG)    Standing Status:   Future    Standing Expiration Date:   04/30/2024    Order Specific Question:  If indicated for the ordered procedure, I authorize the administration of a radiopharmaceutical per Radiology protocol    Answer:   Yes    Order Specific Question:   Preferred imaging location?    Answer:   [facility]   Flu Vaccine Trivalent High Dose (Fluad)   Ambulatory referral to General Surgery    Referral Priority:   Routine    Referral Type:   Surgical    Referral Reason:   Specialty Services Required    Requested Specialty:   General Surgery    Number of Visits Requested:   1    Meds ordered this encounter  Medications   Fluticasone-Umeclidin-Vilant (TRELEGY ELLIPTA) 100-62.5-25 MCG/ACT AEPB    Sig: Inhale 1 puff into the lungs daily.    Dispense:  14 each    Refill:  0    Order Specific Question:   Lot Number?    Answer:   [id number]    Order Specific Question:   Expiration Date?    Answer:   07/24/2024    Order Specific Question:   Quantity    Answer:   1   Fluticasone-Umeclidin-Vilant (TRELEGY ELLIPTA) 100-62.5-25 MCG/ACT AEPB    Sig: Inhale 1 puff into the lungs daily.    Dispense:  60 each    Refill:  6   Will see the patient in follow-up in 4 to 6 weeks time she is to contact us prior to that time should any new difficulties arise.   Gailen Shelter, MD Advanced Bronchoscopy PCCM [facility]    *This note was dictated using voice recognition software/Dragon.  Despite best efforts to proofread, errors can occur which can change the meaning. Any transcriptional errors that result from this process are unintentional and  may not be fully corrected at the time of dictation.

## 2023-05-01 NOTE — Patient Instructions (Signed)
We are placing a referral to Dr. Everlene Farrier, general surgery, he does a lot of hernia surgery.  We have ordered a PET/CT to further evaluate your lung nodule.  Sent a prescription to your pharmacy for the Trelegy, we do not have coupons at this time.  We will see her in follow-up in 4-6 weeks time call sooner should any new problems arise.

## 2023-05-02 ENCOUNTER — Telehealth: Payer: Self-pay | Admitting: Pulmonary Disease

## 2023-05-02 NOTE — Telephone Encounter (Signed)
Pt was told her Trelegy would be $205.00 copay with insurance. Is asking for options

## 2023-05-02 NOTE — Telephone Encounter (Signed)
Spoke to patient. She stated that trelegy is not affordable. She would like a cheaper alternative.   Pharmacy team, please advise. Thanks

## 2023-05-03 ENCOUNTER — Other Ambulatory Visit (HOSPITAL_COMMUNITY): Payer: Self-pay

## 2023-05-03 NOTE — Telephone Encounter (Signed)
Week ago with Markus Daft 2 puffs twice a day, however fill out forms for assistance with AstraZeneca.  We can also provide her with some samples.

## 2023-05-03 NOTE — Telephone Encounter (Signed)
Patient has a deductible to meet which is contributing to the price. The triple therapy options would be cheaper at this time than doing 2 inhalers due to the deductible needing to be met..  ICS/LABA/LAMA Trelegy-$220.60 Breztri-$217.83  ICS/LABA Breo-$165.22 Dulera-$150.94  LAMA Incruse-$153.48 Brand Spiriva Handihaler-$276.65 Spiriva Respimat-$283.34

## 2023-05-03 NOTE — Telephone Encounter (Signed)
Lmtcb.

## 2023-05-04 MED ORDER — BREZTRI AEROSPHERE 160-9-4.8 MCG/ACT IN AERO: 2.0000 | INHALATION_SPRAY | Freq: Two times a day (BID) | RESPIRATORY_TRACT | 0 refills | Status: DC

## 2023-05-04 NOTE — Telephone Encounter (Signed)
I have notified the patient. She will stop by today and pick up the Breztri samples and the AZ&ME form.   I am holding this message to follow on the AZ&ME form.

## 2023-05-04 NOTE — Addendum Note (Signed)
Addended by: Bonney Leitz on: 05/04/2023 10:40 AM   Modules accepted: Orders

## 2023-05-04 NOTE — Telephone Encounter (Signed)
Per Adan Sis, the patient came in the office and picked up the Breztri samples and the form. She will return the form to our office once it is filled out.  Holding message to confirm completion.

## 2023-05-08 ENCOUNTER — Telehealth: Payer: Self-pay | Admitting: Acute Care

## 2023-05-08 NOTE — Telephone Encounter (Signed)
Tiffany calling with call report. For CT scan.Tiffany phone number is [phone]. 

## 2023-05-08 NOTE — Telephone Encounter (Signed)
Call report received -  IMPRESSION: 1. Lung-RADS 3, probably benign findings. There are 2 new subpleural left lower lobe pulmonary nodules measuring 5.2 and 5.5 mm.Short-term follow-up in 6 months is recommended with repeat low-dose chest CT without contrast (please use the following order, "CT CHEST LCS NODULE FOLLOW-UP W/O CM"). 2. Previously identified pulmonary nodules are stable including the 7.8 mm paraspinal right upper lobe pulmonary nodule of concern on the previous study. 3. Aortic Atherosclerosis (ICD10-I70.0) and Emphysema (ICD10-J43.9).

## 2023-05-09 ENCOUNTER — Ambulatory Visit
Admission: RE | Admit: 2023-05-09 | Discharge: 2023-05-09 | Disposition: A | Discharge status: Home / Self Care | Payer: Medicare Other | Source: Ambulatory Visit | Attending: Pulmonary Disease | Admitting: Pulmonary Disease

## 2023-05-09 DIAGNOSIS — R911 Solitary pulmonary nodule: Secondary | ICD-10-CM | POA: Diagnosis present

## 2023-05-09 LAB — GLUCOSE, CAPILLARY: Glucose-Capillary: 93 mg/dL (ref 70–99)

## 2023-05-09 MED ORDER — FLUDEOXYGLUCOSE F - 18 (FDG) INJECTION
10.1000 | Freq: Once | INTRAVENOUS | Status: AC | PRN
  Administered 2023-05-09: 10.8 via INTRAVENOUS

## 2023-05-09 NOTE — Telephone Encounter (Signed)
The patient returned her AZ&ME form and it has been faxed.  Nothing further needed.

## 2023-05-14 ENCOUNTER — Ambulatory Visit (INDEPENDENT_AMBULATORY_CARE_PROVIDER_SITE_OTHER): Payer: Medicare Other | Admitting: Surgery

## 2023-05-14 ENCOUNTER — Encounter: Payer: Self-pay | Admitting: Surgery

## 2023-05-14 VITALS — BP 140/88 | HR 67 | Temp 98.0°F | Ht 65.0 in | Wt 196.0 lb

## 2023-05-14 DIAGNOSIS — K219 Gastro-esophageal reflux disease without esophagitis: Secondary | ICD-10-CM | POA: Diagnosis not present

## 2023-05-14 DIAGNOSIS — K449 Diaphragmatic hernia without obstruction or gangrene: Secondary | ICD-10-CM

## 2023-05-14 NOTE — Progress Notes (Signed)
Request for Cardiology Clearance to request to stop Plavix prior to surgery has been faxed to Dr [name].

## 2023-05-14 NOTE — Progress Notes (Signed)
Patient ID: Tammy Lamb, female   DOB: 07/13/1951, 71 y.o.   MRN: [id number]  HPI Tammy Lamb is a 72 y.o. female in in consultation at the request of Dr. Jayme Cloud for a giant paraesophageal hernia.  She reports significant reflux with bloating right after eating.  Does have heartburn and regurgitation.  Addition to that she does have significant COPD with chronic cough.  DPI only have partially alleviated reflux symptoms.  She has coronary artery disease and had a drug-eluting stent placed to the right RCA months ago and is currently on dual antiplatelet therapy.  She denies any angina chest pain.  Quit smoking 6 months ago.  She has a 49-pack-year history. Prio hx of cholecystectomy and hysterectomy. 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  She also had a barium swallow showing a large paraesophageal hernia with an upside down stomach.  Significant reflux no strictures. Does have chronic back pain and had a fusion of the C-spine in 2020.  HPI  Past Medical History:  Diagnosis Date   Anemia    h/o   Anxiety    Arthritis    Bilateral carotid artery stenosis    CAD (coronary artery disease), native coronary artery 10/18/2022   DES to RCA ONYX FRONTIER 3.5X15   Chronic lower back pain    Colitis, ulcerative (HCC)    COPD (chronic obstructive pulmonary disease) (HCC)    Depression    Diabetes mellitus without complication (HCC)    GERD (gastroesophageal reflux disease)    H/O bladder infections    History of 2019 novel coronavirus disease (COVID-19) 02/2021   History of hiatal hernia    HLD (hyperlipidemia)    Hypertension    Interstitial cystitis    Low sodium levels    Motion sickness    fair rides   Parkinson's disease (HCC)    suspected per dr Sherryll Burger @ [facility] neuro   PVD (peripheral vascular disease) (HCC)    Sinus headache    Stroke (HCC) 2015   mild stroke d/t coreg dose being too high and pt. had severe  hypotension.   TIA (transient ischemic attack) 2015   no deficits.  per pt due to low BP.   Urinary frequency    Wears dentures    partial upper and lower    Past Surgical History:  Procedure Laterality Date   ABDOMINAL HYSTERECTOMY     BSO   ANKLE ARTHROSCOPY Left 03/22/2016   Procedure: ANKLE ARTHROSCOPY DEBRODEMENT EXTENSIVE LEFT FLEXAR TENDON REPAIR;  Surgeon: Gwyneth Revels, DPM;  Location: [facility];  Service: Podiatry;  Laterality: Left;  WITH POPLITEAL   ANTERIOR CERVICAL DECOMP/DISCECTOMY FUSION N/A 04/09/2019   Procedure: ANTERIOR CERVICAL DECOMPRESSION/DISCECTOMY FUSION 1 LEVEL C3-4;  Surgeon: Venetia Night, MD;  Location: [facility];  Service: Neurosurgery;  Laterality: N/A;   BLADDER SUSPENSION     tac   CHOLECYSTECTOMY     COLONOSCOPY     CORONARY STENT INTERVENTION N/A 10/18/2022   Procedure: CORONARY STENT INTERVENTION;  Surgeon: Iran Ouch, MD;  Location: [facility];  Service: Cardiovascular;  Laterality: N/A;   DIAGNOSTIC LAPAROSCOPY     adhesions   KNEE ARTHROSCOPY Left 02/13/2014   Procedure: LEFT ARTHROSCOPY KNEE, PARTIAL MEDIAL MENISECTOMY AND PLICA, CHONDROPLASTY OF PATELLA FEMORAL JOINT;  Surgeon: Harvie Junior, MD;  Location: [facility];  Service: Orthopedics;  Laterality: Left;   LEFT HEART CATH  AND CORONARY ANGIOGRAPHY N/A 10/18/2022   Procedure: LEFT HEART CATH AND CORONARY ANGIOGRAPHY;  Surgeon: Iran Ouch, MD;  Location: [facility];  Service: Cardiovascular;  Laterality: N/A;   LUMBAR LAMINECTOMY/DECOMPRESSION MICRODISCECTOMY N/A 05/30/2017   Procedure: LUMBAR LAMINECTOMY/DECOMPRESSION MICRODISCECTOMY 3 LEVELS-L3-S1;  Surgeon: Venetia Night, MD;  Location: [facility];  Service: Neurosurgery;  Laterality: N/A;   LUMBAR LAMINECTOMY/DECOMPRESSION MICRODISCECTOMY N/A 05/27/2021   Procedure: L4-S1 DECOMPRESSION;  Surgeon: Venetia Night, MD;  Location: [facility];  Service: Neurosurgery;  Laterality:  N/A;   TENDON REPAIR Left 03/22/2016   Procedure: ZOXWRU TENDON REPAIR;  Surgeon: Gwyneth Revels, DPM;  Location: [facility];  Service: Podiatry;  Laterality: Left;   TONSILLECTOMY     UPPER GI ENDOSCOPY      Family History  Problem Relation Age of Onset   Diabetes Mother    Hypertension Mother    Hypertension Father    COPD Father    Heart disease Father    Breast cancer Neg Hx     Social History Social History   Tobacco Use   Smoking status: Former    Current packs/day: 1.00    Average packs/day: 1 pack/day for 49.0 years (49.0 ttl pk-yrs)    Types: Cigarettes    Passive exposure: Past   Smokeless tobacco: Never   Tobacco comments:    Quit in March of 2024.  Vaping Use   Vaping status: Never Used  Substance Use Topics   Alcohol use: Not Currently    Comment: Occassional,   Drug use: No    Allergies  Allergen Reactions   Aspirin Other (See Comments)    GERD   Sulfa Antibiotics Nausea And Vomiting    Current Outpatient Medications  Medication Sig Dispense Refill   acetaminophen (TYLENOL) 500 MG tablet Take 1,000 mg by mouth every 6 (six) hours as needed for moderate pain or headache.     albuterol (VENTOLIN HFA) 108 (90 Base) MCG/ACT inhaler Inhale 2 puffs into the lungs every 6 (six) hours as needed for wheezing or shortness of breath. 8 g 6   ascorbic acid (VITAMIN C) 1000 MG tablet Take 1,000 mg by mouth at bedtime.     aspirin EC 81 MG tablet Take 1 tablet (81 mg total) by mouth daily. Swallow whole. 30 tablet 12   atorvastatin (LIPITOR) 80 MG tablet Take 80 mg by mouth at bedtime.     Budeson-Glycopyrrol-Formoterol (BREZTRI AEROSPHERE) 160-9-4.8 MCG/ACT AERO Inhale 2 puffs into the lungs in the morning and at bedtime. 17.7 g 0   cholecalciferol (VITAMIN D) 25 MCG (1000 UNIT) tablet Take 1,000 Units by mouth at bedtime.     Cinnamon 500 MG TABS Take 500 mg by mouth in the morning and at bedtime.     citalopram (CELEXA) 20 MG tablet Take 20 mg by mouth  every morning.     clopidogrel (PLAVIX) 75 MG tablet Take 1 tablet (75 mg total) by mouth daily with breakfast. 30 tablet 11   gabapentin (NEURONTIN) 300 MG capsule Take 3 capsules (900 mg total) by mouth 2 (two) times daily. 180 capsule 5   HYDROcodone-acetaminophen (NORCO) 7.5-325 MG tablet Take 1 tablet by mouth every 8 (eight) hours as needed for severe pain. Must last 30 days. 90 tablet 0   [START ON 05/30/2023] HYDROcodone-acetaminophen (NORCO) 7.5-325 MG tablet Take 1 tablet by mouth every 8 (eight) hours as needed for severe pain. Must last 30 days. 90 tablet 0   hydrOXYzine (ATARAX/VISTARIL) 25 MG tablet Take  25 mg by mouth at bedtime.     magnesium oxide (MAG-OX) 400 MG tablet Take 400 mg by mouth at bedtime.     metoprolol tartrate (LOPRESSOR) 25 MG tablet Take 1 tablet (25 mg total) by mouth 2 (two) times daily. 180 tablet 3   Omega-3 Fatty Acids (FISH OIL) 1200 MG CAPS Take 1,200 mg by mouth daily.     oxybutynin (DITROPAN-XL) 10 MG 24 hr tablet Take 10 mg by mouth at bedtime.     pantoprazole (PROTONIX) 40 MG tablet Take 1 tablet (40 mg total) by mouth daily. 30 tablet 3   pentosan polysulfate (ELMIRON) 100 MG capsule Take 100 mg by mouth 2 (two) times daily.     propranolol (INDERAL) 20 MG tablet Take 1 tablet by mouth 2 (two) times daily.     sulfaSALAzine (AZULFIDINE) 500 MG tablet Take 500 mg by mouth 2 (two) times daily.     tiZANidine (ZANAFLEX) 4 MG tablet Take 1 tablet (4 mg total) by mouth every 12 (twelve) hours as needed. 60 tablet 5   valsartan (DIOVAN) 160 MG tablet Take 160 mg by mouth daily.     vitamin B-12 (CYANOCOBALAMIN) 1000 MCG tablet Take 1,000 mcg by mouth daily.     DULoxetine (CYMBALTA) 60 MG capsule Take 60 mg by mouth daily.     nitroGLYCERIN (NITROSTAT) 0.4 MG SL tablet Place 1 tablet (0.4 mg total) under the tongue every 5 (five) minutes as needed for chest pain. 90 tablet 3   No current facility-administered medications for this visit.    Facility-Administered Medications Ordered in Other Visits  Medication Dose Route Frequency Provider Last Rate Last Admin   albuterol (PROVENTIL) (2.5 MG/3ML) 0.083% nebulizer solution 2.5 mg  2.5 mg Nebulization Once Salena Saner, MD         Review of Systems Full ROS  was asked and was negative except for the information on the HPI  Physical Exam Blood pressure (!) 140/88, pulse 67, temperature 98 F (36.7 C), height 5\' 5"  (1.651 m), weight 196 lb (88.9 kg), SpO2 95%. CONSTITUTIONAL: NAD. EYES: Pupils are equal, round, Sclera are non-icteric. EARS, NOSE, MOUTH AND THROAT: The oropharynx is clear. The oral mucosa is pink and moist. Hearing is intact to voice. LYMPH NODES:  Lymph nodes in the neck are normal. RESPIRATORY:  Lungs are clear. There is normal respiratory effort, Scattered rhonchi with end expiratory wheezes noted. No pathologic use of accessory muscles. CARDIOVASCULAR: Heart is regular without murmurs, gallops, or rubs. GI: The abdomen is  soft, nontender, and nondistended. There are no palpable masses. There is no hepatosplenomegaly. There are normal bowel sounds GU: Rectal deferred.   MUSCULOSKELETAL: Normal muscle strength and tone. No cyanosis or edema.   SKIN: Turgor is good and there are no pathologic skin lesions or ulcers. NEUROLOGIC: Motor and sensation is grossly normal. Cranial nerves are grossly intact. PSYCH:  Oriented to person, place and time. Affect is normal.  Data Reviewed  I have personally reviewed the patient's imaging, laboratory findings and medical records.    Assessment/Plan 72 year old female with a giant type III paraesophageal hernia with an upside down stomach.  I do think that she will definitely benefit from paraesophageal hernia repair.  We do have to do our due diligence and we will have to obtain preoperative optimization before surgical intervention is done.  She will also require EGD.  I have sent referral to Dr. Tonna Boehringer.  Will see  her back once she c leads her  EGD.  We talked about what a robotic paraesophageal hernia will entail.  Modification.  Also we talked about the risk the benefits and the potential complications including but not limited to: Heart attacks, esophageal or bowel perforations, pneumothorax, vascular injuries, chronic pain, bloating, diarrhea.  She understands and understands that this needs to be done.  Do think the paraesophageal component is causing significant pulmonary symptoms as well. I spent greater than 60 minutes in this encounter including personally reviewing imaging studies, coordinating her care, placing orders and performing documentation. Sterling Big, MD FACS General Surgeon 05/14/2023, 5:19 PM

## 2023-05-14 NOTE — Patient Instructions (Addendum)
We will refer you to [facility], Dr Tonna Boehringer for him to do an Upper Endoscopy on you. His office will call you to schedule this.  We will send a clearance to Dr [name] for a Clearance to see if you can come off of your Plavix prior to surgery.   We will have you follow up here in 3-4 weeks after we get the results of your Upper Endoscopy.    Hiatal Hernia  A hiatal hernia occurs when part of the stomach slides above the muscle that separates the abdomen from the chest (diaphragm). A person can be born with a hiatal hernia (congenital), or it may develop over time. In almost all cases of hiatal hernia, only the top part of the stomach pushes through the diaphragm. Many people have a hiatal hernia with no symptoms. The larger the hernia, the more likely it is that you will have symptoms. In some cases, a hiatal hernia allows stomach acid to flow back into the tube that carries food from your mouth to your stomach (esophagus). This may cause heartburn symptoms. The development of heartburn symptoms may mean that you have a condition called gastroesophageal reflux disease (GERD). What are the causes? This condition is caused by a weakness in the opening (hiatus) where the esophagus passes through the diaphragm to attach to the upper part of the stomach. A person may be born with a weakness in the hiatus, or a weakness can develop over time. What increases the risk? This condition is more likely to develop in: Older people. Age is a major risk factor for a hiatal hernia, especially if you are over the age of 64. Pregnant women. People who are overweight. People who have frequent constipation. What are the signs or symptoms? Symptoms of this condition usually develop in the form of GERD symptoms. Symptoms include: Heartburn. Upset stomach (indigestion). Trouble swallowing. Coughing or wheezing. Wheezing is making high-pitched whistling sounds when you breathe. Sore throat. Chest  pain. Nausea and vomiting. How is this diagnosed? This condition may be diagnosed during testing for GERD. Tests that may be done include: X-rays of your stomach or chest. An upper gastrointestinal (GI) series. This is an X-ray exam of your GI tract that is taken after you swallow a chalky liquid that shows up clearly on the X-ray. Endoscopy. This is a procedure to look into your stomach using a thin, flexible tube that has a tiny camera and light on the end of it. How is this treated? This condition may be treated by: Dietary and lifestyle changes to help reduce GERD symptoms. Medicines. These may include: Over-the-counter antacids. Medicines that make your stomach empty more quickly. Medicines that block the production of stomach acid (H2 blockers). Stronger medicines to reduce stomach acid (proton pump inhibitors). Surgery to repair the hernia, if other treatments are not helping. If you have no symptoms, you may not need treatment. Follow these instructions at home: Lifestyle and activity Do not use any products that contain nicotine or tobacco. These products include cigarettes, chewing tobacco, and vaping devices, such as e-cigarettes. If you need help quitting, ask your health care provider. Try to achieve and maintain a healthy body weight. Avoid putting pressure on your abdomen. Anything that puts pressure on your abdomen increases the amount of acid that may be pushed up into your esophagus. Avoid bending over, especially after eating. Raise the head of your bed by putting blocks under the legs. This keeps your head and esophagus higher than your stomach.  Do not wear tight clothing around your chest or stomach. Try not to strain when having a bowel movement, when urinating, or when lifting heavy objects. Eating and drinking Avoid foods that can worsen GERD symptoms. These may include: Fatty foods, like fried foods. Citrus fruits, like oranges or lemon. Other foods and drinks  that contain acid, like orange juice or tomatoes. Spicy food. Chocolate. Eat frequent small meals instead of three large meals a day. This helps prevent your stomach from getting too full. Eat slowly. Do not lie down right after eating. Do not eat 1-2 hours before bed. Do not drink beverages with caffeine. These include cola, coffee, cocoa, and tea. Do not drink alcohol. General instructions Take over-the-counter and prescription medicines only as told by your health care provider. Keep all follow-up visits. Your health care provider will want to check that any new prescribed medicines are helping your symptoms. Contact a health care provider if: Your symptoms are not controlled with medicines or lifestyle changes. You are having trouble swallowing. You have coughing or wheezing that will not go away. Your pain is getting worse. Your pain spreads to your arms, neck, jaw, teeth, or back. You feel nauseous or you vomit. Get help right away if: You have shortness of breath. You vomit blood. You have bright red blood in your stools. You have black, tarry stools. These symptoms may be an emergency. Get help right away. Call 911. Do not wait to see if the symptoms will go away. Do not drive yourself to the hospital. Summary A hiatal hernia occurs when part of the stomach slides above the muscle that separates the abdomen from the chest. A person may be born with a weakness in the hiatus, or a weakness can develop over time. Symptoms of a hiatal hernia may include heartburn, trouble swallowing, or sore throat. Management of a hiatal hernia includes eating frequent small meals instead of three large meals a day. Get help right away if you vomit blood, have bright red blood in your stools, or have black, tarry stools. This information is not intended to replace advice given to you by your health care provider. Make sure you discuss any questions you have with your health care  provider. Document Revised: 09/06/2021 Document Reviewed: 09/06/2021 Elsevier Patient Education  2024 [employer].

## 2023-05-15 ENCOUNTER — Other Ambulatory Visit: Payer: Self-pay

## 2023-05-15 ENCOUNTER — Telehealth: Payer: Self-pay | Admitting: Surgery

## 2023-05-15 ENCOUNTER — Telehealth: Payer: Self-pay | Admitting: Cardiology

## 2023-05-15 ENCOUNTER — Telehealth: Payer: Self-pay | Admitting: Pulmonary Disease

## 2023-05-15 DIAGNOSIS — K219 Gastro-esophageal reflux disease without esophagitis: Secondary | ICD-10-CM

## 2023-05-15 NOTE — Telephone Encounter (Signed)
Patient has been informed that unfortunately we will have to postpone surgery for now.  Cardiology will not clear and they want [place] to postpone until after her cardiac therapy 10/18/23.  Per Dr Everlene Farrier does still in the meantime want patient to have her EGD done.  Patient verbalized understanding but this is not the direction she was hoping.

## 2023-05-15 NOTE — Telephone Encounter (Signed)
Preoperative team, patient underwent cardiac catheterization with stent placement.  She will be unable to hold dual antiplatelet therapy until after 10/18/2023.  Please contact requesting office and let them know that she will have to postpone her surgery.  Thank you for your help.  Thomasene Ripple. Phylisha Dix NP-C     05/15/2023, 9:06 AM [facility] [address] Office [phone] Fax [phone]

## 2023-05-15 NOTE — Telephone Encounter (Signed)
Reviewing her cardiac catheterization note it appears that she needed to be on Plavix for at least 12 months.  Her catheterization was in March so this would be until March 2025.  The reason is likely because she had a very tight lesion and they are concerned that the stent would clot.  My advice is that she communicate with Dr. Everlene Farrier her surgeon, if he deems that the repair of the hernia is necessary on a semiemergent basis then he will need to confer with cardiology directly so that they can come up with a plan of care for her in this situation.

## 2023-05-15 NOTE — Telephone Encounter (Signed)
Patient is calling about her hernia surgery. Her cardiologist wants her to continue taking the medication Plavix, blood thinner. In order for her to have the surgery she cannot take a blood thinner. She is confused as to why her cardiologist wont approve for her to be off the medication so that she can get the surgery. Please call and advise. [phone]

## 2023-05-15 NOTE — Telephone Encounter (Signed)
I will update the surgeon's office to see notes from Edd Fabian, FNP.

## 2023-05-15 NOTE — Telephone Encounter (Signed)
Pre-operative Risk Assessment    Patient Name: Tammy Lamb  DOB: 1950-12-23 MRN: [id number]      Request for Surgical Clearance    Procedure:   Robotic repair of hiatal hernia   Date of Surgery:  Clearance TBD                                 Surgeon:  Dr Sterling Big Surgeon's Group or Practice Name:  [facility] Phone number:  [phone] Fax number:  [phone]   Type of Clearance Requested:   - Pharmacy:  Hold Clopidogrel (Plavix) instructions   Type of Anesthesia:  General    Additional requests/questions:    Courtney Heys   05/15/2023, 8:50 AM

## 2023-05-16 ENCOUNTER — Telehealth: Payer: Self-pay | Admitting: Student in an Organized Health Care Education/Training Program

## 2023-05-16 NOTE — Telephone Encounter (Signed)
Patient is having problems with getting Hydrocodone filled. Says they are asking double the copay. I did advise her to call her insurance to find out why. She seems confused about the fill days and when she last filled her script. She also says Dr Cherylann Ratel told her she could take 2-3 xtra when she was having a lot of pain. Please check to see when she filled and when it is time to fill again. Please call advise patient

## 2023-05-17 ENCOUNTER — Telehealth: Payer: Self-pay

## 2023-05-17 ENCOUNTER — Ambulatory Visit
Admission: RE | Admit: 2023-05-17 | Discharge: 2023-05-17 | Disposition: A | Discharge status: Home / Self Care | Payer: Medicare Other | Source: Ambulatory Visit | Attending: Internal Medicine | Admitting: Internal Medicine

## 2023-05-17 DIAGNOSIS — Z1231 Encounter for screening mammogram for malignant neoplasm of breast: Secondary | ICD-10-CM | POA: Insufficient documentation

## 2023-05-17 NOTE — Telephone Encounter (Signed)
Spoke with patients pharmacy and they have medications ready to be picked up.  The copay has increased to $114.90.  The pharmacist says she may be in a donut hole.  Attempted to call patient.  Lm for her to call us at the office.

## 2023-05-17 NOTE — Telephone Encounter (Signed)
I spoke with the patient. She said she saw Dr. Everlene Farrier and Dr. Tonna Boehringer and they have assured her that there are ways around her Plavix issue. She wanted you to know she is scheduled for a ESOPHAGOGASTRODUODENOSCOPY (EGD) WITH PROPOFOL on 06/06/2023 with Dr. Tonna Boehringer.  Nothing further needed.

## 2023-05-18 NOTE — Telephone Encounter (Signed)
Noted  

## 2023-05-21 ENCOUNTER — Other Ambulatory Visit: Payer: Self-pay

## 2023-05-21 DIAGNOSIS — Z122 Encounter for screening for malignant neoplasm of respiratory organs: Secondary | ICD-10-CM

## 2023-05-21 DIAGNOSIS — Z87891 Personal history of nicotine dependence: Secondary | ICD-10-CM

## 2023-05-25 ENCOUNTER — Telehealth: Payer: Self-pay

## 2023-05-25 NOTE — Telephone Encounter (Signed)
Patient called back and states she will pick up Breztri RX at her appointment on 11/19. Confirmed she has enough medication to last.

## 2023-05-25 NOTE — Telephone Encounter (Signed)
Received [employer] Rx from pharmacy services.   Rx has been placed up front for pick up.  Lm for patient.

## 2023-05-28 NOTE — Telephone Encounter (Signed)
Noted.  Will close encounter.  

## 2023-05-30 ENCOUNTER — Encounter: Payer: Self-pay | Admitting: Surgery

## 2023-06-06 ENCOUNTER — Ambulatory Visit: Payer: Medicare Other | Admitting: Anesthesiology

## 2023-06-06 ENCOUNTER — Ambulatory Visit
Admission: RE | Admit: 2023-06-06 | Discharge: 2023-06-06 | Disposition: A | Discharge status: Home / Self Care | Payer: Medicare Other | Source: Ambulatory Visit | Attending: Surgery | Admitting: Surgery

## 2023-06-06 ENCOUNTER — Other Ambulatory Visit: Payer: Self-pay

## 2023-06-06 ENCOUNTER — Encounter
Admission: RE | Disposition: A | Discharge status: Home / Self Care | Payer: Self-pay | Source: Ambulatory Visit | Attending: Surgery

## 2023-06-06 ENCOUNTER — Ambulatory Visit: Payer: Medicare Other | Admitting: Surgery

## 2023-06-06 ENCOUNTER — Encounter: Payer: Self-pay | Admitting: Surgery

## 2023-06-06 DIAGNOSIS — K219 Gastro-esophageal reflux disease without esophagitis: Secondary | ICD-10-CM | POA: Diagnosis not present

## 2023-06-06 DIAGNOSIS — K449 Diaphragmatic hernia without obstruction or gangrene: Secondary | ICD-10-CM | POA: Insufficient documentation

## 2023-06-06 DIAGNOSIS — J449 Chronic obstructive pulmonary disease, unspecified: Secondary | ICD-10-CM | POA: Insufficient documentation

## 2023-06-06 DIAGNOSIS — I10 Essential (primary) hypertension: Secondary | ICD-10-CM | POA: Insufficient documentation

## 2023-06-06 DIAGNOSIS — Z87891 Personal history of nicotine dependence: Secondary | ICD-10-CM | POA: Diagnosis not present

## 2023-06-06 DIAGNOSIS — Z8711 Personal history of peptic ulcer disease: Secondary | ICD-10-CM | POA: Insufficient documentation

## 2023-06-06 HISTORY — PX: ESOPHAGOGASTRODUODENOSCOPY (EGD) WITH PROPOFOL: SHX5813

## 2023-06-06 HISTORY — DX: Prediabetes: R73.03

## 2023-06-06 SURGERY — ESOPHAGOGASTRODUODENOSCOPY (EGD) WITH PROPOFOL
Anesthesia: General

## 2023-06-06 MED ORDER — LIDOCAINE HCL (PF) 2 % IJ SOLN
INTRAMUSCULAR | Status: AC
  Filled 2023-06-06: qty 5

## 2023-06-06 MED ORDER — LIDOCAINE HCL (CARDIAC) PF 100 MG/5ML IV SOSY
PREFILLED_SYRINGE | INTRAVENOUS | Status: DC | PRN
  Administered 2023-06-06: 100 mg via INTRAVENOUS

## 2023-06-06 MED ORDER — PROPOFOL 10 MG/ML IV BOLUS
INTRAVENOUS | Status: AC
Start: 2023-06-06 — End: ?
  Filled 2023-06-06: qty 40

## 2023-06-06 MED ORDER — SODIUM CHLORIDE 0.9 % IV SOLN: INTRAVENOUS | Status: DC

## 2023-06-06 MED ORDER — PROPOFOL 10 MG/ML IV BOLUS
INTRAVENOUS | Status: DC | PRN
  Administered 2023-06-06: 40 mg via INTRAVENOUS
  Administered 2023-06-06 (×2): 50 mg via INTRAVENOUS

## 2023-06-06 MED ORDER — PROPOFOL 500 MG/50ML IV EMUL
INTRAVENOUS | Status: DC | PRN
  Administered 2023-06-06: 75 ug/kg/min via INTRAVENOUS

## 2023-06-06 NOTE — Interval H&P Note (Signed)
History and Physical Interval Note:  06/06/2023 7:35 AM  Tammy Lamb  has presented today for surgery, with the diagnosis of hiatal hernia K44.9.  The various methods of treatment have been discussed with the patient and family. After consideration of risks, benefits and other options for treatment, the patient has consented to  Procedure(s) with comments: ESOPHAGOGASTRODUODENOSCOPY (EGD) WITH PROPOFOL (N/A) - patient to stay on plavix and aspirin due to recent stent placement, Dr. Tonna Boehringer aware as a surgical intervention.  The patient's history has been reviewed, patient examined, no change in status, stable for surgery.  I have reviewed the patient's chart and labs.  Questions were answered to the patient's satisfaction.     Jabria Loos Tonna Boehringer

## 2023-06-06 NOTE — H&P (Signed)
Sung Amabile, DO - 05/16/2023 2:30 PM EDT Formatting of this note is different from the original. Subjective:  CC: Hiatal hernia [K44.9]  HPI: Tammy Lamb is a 72 y.o. female who was referred by [facility] for evaluation of above. Planning to undergo paraesophageal hernia repair. Requesting EGD as part of pre-operative evaluation.  Past Medical History: has a past medical history of Anxiety, COPD (chronic obstructive pulmonary disease) (CMS/HHS-HCC), COVID-19 (02/2021), Depression, GERD (gastroesophageal reflux disease), Hyperlipidemia, Hypertension, and Osteoarthritis.  Past Surgical History: has a past surgical history that includes Hysterectomy; Tonsillectomy; L1-L4 Lumbar Decompression (05/30/2017); C3-4 anterior cervical discectomy and fusion (04/09/2019); Oophorectomy; Cholecystectomy; and L4-S1 decompression (05/27/2021).  Family History: family history includes Back Pain in her father; Diabetes in her brother, mother, and sister; High blood pressure (Hypertension) in her brother, father, mother, and sister; Myocardial Infarction (Heart attack) in her father.  Social History: reports that she has been smoking cigarettes. She has never used smokeless tobacco. She reports current alcohol use. She reports that she does not use drugs.  Current Medications: has a current medication list which includes the following prescription(s): acetaminophen, amlodipine, ascorbic acid (vitamin c), aspirin, atorvastatin, cephalexin, cholecalciferol, cinnamon bark, citalopram, clopidogrel, cyanocobalamin, duloxetine, fluticasone propion-salmeterol, gabapentin, hydrocodone-acetaminophen, hydroxyzine, magnesium oxide, melatonin, metoprolol tartrate, nystatin, omega-3 fatty acids-fish oil, oxybutynin, pantoprazole, pentosan polysulfate, sulfasalazine, tizanidine, turmeric, valsartan, albuterol mdi (proventil, ventolin, proair) hfa, blood glucose diagnostic, and  ipratropium-albuterol.  Allergies: Allergies Allergen Reactions Aspirin Other (See Comments) GERD Sulfa (Sulfonamide Antibiotics) Vomiting Sulfamethoxazole-Trimethoprim Other (See Comments) HR racing  ROS: A 15 point review of systems was performed and pertinent positives and negatives noted in HPI  Objective:   BP (!) 141/86  Pulse 58  Ht 165.1 cm (5\' 5" )  Wt 89.4 kg (197 lb 1.5 oz)  LMP (LMP Unknown)  BMI 32.80 kg/m  Constitutional : No distress, cooperative, alert Lymphatics/Throat: Supple with no lymphadenopathy Respiratory: Clear to auscultation bilaterally Cardiovascular: Regular rate and rhythm Gastrointestinal: Soft, non-tender, non-distended, no organomegaly. Musculoskeletal: Steady gait and movement Skin: Cool and moist Psychiatric: Normal affect, non-agitated, not confused    LABS: N/a  RADS: N/a  Assessment:   Hiatal hernia [K44.9]  Plan:   1. Hiatal hernia [K44.9] R/b/a discussed. Risks include bleeding, perforation. Benefits include diagnostic, curative procedure if needed. Alternatives include continued observation. Pt verbalized understanding.  2. Patient has elected to proceed with EGD. Procedure will be scheduled. She is to continue aspirin and plavix due to recent stent. She understands increased risk of bleeding complications from procedure.  labs/images/medications/previous chart entries reviewed personally and relevant changes/updates noted above.

## 2023-06-06 NOTE — Op Note (Signed)
[facility] Gastroenterology Patient Name: Campbell Deale Procedure Date: 06/06/2023 7:12 AM MRN: [id number] Account #: [id number] Date of Birth: May 24, 1951 Admit Type: Outpatient Age: 72 Room: [facility] ENDO ROOM 1 Gender: Female Note Status: Finalized Instrument Name: Upper Endoscope [id number] Procedure:             Upper GI endoscopy Indications:           Hiatal hernia Providers:             Sung Amabile MD, MD Referring MD:          Sung Amabile MD, MD (Referring MD), Enid Baas,                         MD (Referring MD) Medicines:             Propofol per Anesthesia Complications:         No immediate complications. Procedure:             Pre-Anesthesia Assessment:                        - After reviewing the risks and benefits, the patient                         was deemed in satisfactory condition to undergo the                         procedure in an ambulatory setting.                        After obtaining informed consent, the endoscope was                         passed under direct vision. Throughout the procedure,                         the patient's blood pressure, pulse, and oxygen                         saturations were monitored continuously. The Endoscope                         was introduced through the mouth, and advanced to the                         pylorus. duodenum unable to be entered due to hiatal                         hernia causing abnormal angle. scope was not forced                         after several attempts due to mucosal irritation and                         bleeding with patient being on plavix and aspirin. The                         upper GI endoscopy was technically difficult and  complex due to abnormal anatomy. Findings:      The esophagus was normal.      The stomach was normal.      The examined duodenum was normal. Impression:            - Normal esophagus.                        -  Normal stomach.                        - Normal examined duodenum.                        - No specimens collected. Recommendation:        - Discharge patient to home.                        - Resume previous diet.                        - Written discharge instructions were provided to the                         patient.                        - Repeat as needed Procedure Code(s):     --- Professional ---                        [phone], 52, Esophagogastroduodenoscopy, flexible,                         transoral; diagnostic, including collection of                         specimen(s) by brushing or washing, when performed                         (separate procedure) Diagnosis Code(s):     --- Professional ---                        K44.9, Diaphragmatic hernia without obstruction or                         gangrene CPT copyright 2022 American Medical Association. All rights reserved. The codes documented in this report are preliminary and upon coder review may  be revised to meet current compliance requirements. Dr. Harrie Foreman, MD Sung Amabile MD, MD 06/06/2023 7:58:11 AM This report has been signed electronically. Number of Addenda: 0 Note Initiated On: 06/06/2023 7:12 AM Estimated Blood Loss:  Estimated blood loss was minimal.      [facility]

## 2023-06-06 NOTE — Anesthesia Postprocedure Evaluation (Signed)
Anesthesia Post Note  Patient: Yaneliz D Zwiefelhofer  Procedure(s) Performed: ESOPHAGOGASTRODUODENOSCOPY (EGD) WITH PROPOFOL  Patient location during evaluation: Endoscopy Anesthesia Type: General Level of consciousness: awake and alert Pain management: pain level controlled Vital Signs Assessment: post-procedure vital signs reviewed and stable Respiratory status: spontaneous breathing, nonlabored ventilation, respiratory function stable and patient connected to nasal cannula oxygen Cardiovascular status: blood pressure returned to baseline and stable Postop Assessment: no apparent nausea or vomiting Anesthetic complications: no   No notable events documented.   Last Vitals:  Vitals:   06/06/23 0806 06/06/23 0816  BP: (!) 153/98 (!) 168/105  Pulse: (!) 57 (!) 54  Resp: 20   Temp:    SpO2: 99% 99%    Last Pain:  Vitals:   06/06/23 0816  TempSrc:   PainSc: 0-No pain                 Cleda Mccreedy Rebecka Oelkers

## 2023-06-06 NOTE — Anesthesia Preprocedure Evaluation (Signed)
Anesthesia Evaluation  Patient identified by MRN, date of birth, ID band Patient awake    Reviewed: Allergy & Precautions, NPO status , Patient's Chart, lab work & pertinent test results  History of Anesthesia Complications Negative for: history of anesthetic complications  Airway Mallampati: III  TM Distance: <3 FB Neck ROM: full    Dental  (+) Chipped, Poor Dentition, Missing   Pulmonary shortness of breath and with exertion, COPD, former smoker   Pulmonary exam normal        Cardiovascular Exercise Tolerance: Good hypertension, (-) angina + CAD and + Peripheral Vascular Disease  Normal cardiovascular exam     Neuro/Psych  Headaches PSYCHIATRIC DISORDERS      TIA Neuromuscular disease CVA, Residual Symptoms    GI/Hepatic Neg liver ROS, hiatal hernia, PUD,GERD  Controlled,,  Endo/Other  negative endocrine ROSdiabetes, Type 2    Renal/GU Renal disease  negative genitourinary   Musculoskeletal   Abdominal   Peds  Hematology negative hematology ROS (+)   Anesthesia Other Findings Past Medical History: No date: Anemia     Comment:  h/o No date: Anxiety No date: Arthritis No date: Bilateral carotid artery stenosis 10/18/2022: CAD (coronary artery disease), native coronary artery     Comment:  DES to RCA ONYX FRONTIER 3.5X15 No date: Chronic lower back pain No date: Colitis, ulcerative (HCC) No date: COPD (chronic obstructive pulmonary disease) (HCC) No date: Depression No date: Diabetes mellitus without complication (HCC) No date: GERD (gastroesophageal reflux disease) No date: H/O bladder infections 02/2021: History of 2019 novel coronavirus disease (COVID-19) No date: History of hiatal hernia No date: HLD (hyperlipidemia) No date: Hypertension No date: Interstitial cystitis No date: Low sodium levels No date: Motion sickness     Comment:  fair rides No date: Parkinson's disease (HCC)     Comment:   suspected per dr Sherryll Burger @ [facility] neuro No date: Pre-diabetes No date: PVD (peripheral vascular disease) (HCC) No date: Sinus headache 2015: Stroke (HCC)     Comment:  mild stroke d/t coreg dose being too high and pt. had               severe hypotension. 2015: TIA (transient ischemic attack)     Comment:  no deficits.  per pt due to low BP. No date: Urinary frequency No date: Wears dentures     Comment:  partial upper and lower  Past Surgical History: No date: ABDOMINAL HYSTERECTOMY     Comment:  BSO 03/22/2016: ANKLE ARTHROSCOPY; Left     Comment:  Procedure: ANKLE ARTHROSCOPY DEBRODEMENT EXTENSIVE LEFT               FLEXAR TENDON REPAIR;  Surgeon: Gwyneth Revels, DPM;                Location: [facility];  Service: Podiatry;                Laterality: Left;  WITH POPLITEAL 04/09/2019: ANTERIOR CERVICAL DECOMP/DISCECTOMY FUSION; N/A     Comment:  Procedure: ANTERIOR CERVICAL DECOMPRESSION/DISCECTOMY               FUSION 1 LEVEL C3-4;  Surgeon: Venetia Night, MD;                Location: [facility] ORS;  Service: Neurosurgery;  Laterality:               N/A; No date: BLADDER SUSPENSION     Comment:  tac No date: CHOLECYSTECTOMY No date: COLONOSCOPY 10/18/2022:  CORONARY STENT INTERVENTION; N/A     Comment:  Procedure: CORONARY STENT INTERVENTION;  Surgeon: Iran Ouch, MD;  Location: [facility] INVASIVE CV LAB;                Service: Cardiovascular;  Laterality: N/A; No date: DIAGNOSTIC LAPAROSCOPY     Comment:  adhesions 02/13/2014: KNEE ARTHROSCOPY; Left     Comment:  Procedure: LEFT ARTHROSCOPY KNEE, PARTIAL MEDIAL               MENISECTOMY AND PLICA, CHONDROPLASTY OF PATELLA FEMORAL               JOINT;  Surgeon: Harvie Junior, MD;  Location: [facility];  Service: Orthopedics;  Laterality: Left; 10/18/2022: LEFT HEART CATH AND CORONARY ANGIOGRAPHY; N/A     Comment:  Procedure: LEFT HEART CATH AND CORONARY ANGIOGRAPHY;                 Surgeon: Iran Ouch, MD;  Location: [facility] INVASIVE               CV LAB;  Service: Cardiovascular;  Laterality: N/A; 05/30/2017: LUMBAR LAMINECTOMY/DECOMPRESSION MICRODISCECTOMY; N/A     Comment:  Procedure: LUMBAR LAMINECTOMY/DECOMPRESSION               MICRODISCECTOMY 3 LEVELS-L3-S1;  Surgeon: Venetia Night, MD;  Location: [facility];  Service: Neurosurgery;              Laterality: N/A; 05/27/2021: LUMBAR LAMINECTOMY/DECOMPRESSION MICRODISCECTOMY; N/A     Comment:  Procedure: L4-S1 DECOMPRESSION;  Surgeon: Venetia Night, MD;  Location: [facility];  Service: Neurosurgery;              Laterality: N/A; 03/22/2016: TENDON REPAIR; Left     Comment:  Procedure: [id number] TENDON REPAIR;  Surgeon: Gwyneth Revels, DPM;  Location: [facility];  Service:               Podiatry;  Laterality: Left; No date: TONSILLECTOMY No date: UPPER GI ENDOSCOPY  BMI    Body Mass Index: 32.45 kg/m      Reproductive/Obstetrics negative OB ROS                             Anesthesia Physical Anesthesia Plan  ASA: 3  Anesthesia Plan: General   Post-op Pain Management:    Induction: Intravenous  PONV Risk Score and Plan: Propofol infusion and TIVA  Airway Management Planned: Natural Airway and Nasal Cannula  Additional Equipment:   Intra-op Plan:   Post-operative Plan:   Informed Consent: I have reviewed the patients History and Physical, chart, labs and discussed the procedure including the risks, benefits and alternatives for the proposed anesthesia with the patient or authorized representative who has indicated his/her understanding and acceptance.     Dental Advisory Given  Plan Discussed with: Anesthesiologist, CRNA and Surgeon  Anesthesia Plan Comments: (Patient consented for risks of anesthesia including but not limited to:  - adverse reactions to medications - risk of airway placement if required -  damage to  eyes, teeth, lips or other oral mucosa - nerve damage due to positioning  - sore throat or hoarseness - Damage to heart, brain, nerves, lungs, other parts of body or loss of life  Patient voiced understanding and assent.)       Anesthesia Quick Evaluation

## 2023-06-06 NOTE — Transfer of Care (Signed)
Immediate Anesthesia Transfer of Care Note  Patient: Tammy Lamb  Procedure(s) Performed: ESOPHAGOGASTRODUODENOSCOPY (EGD) WITH PROPOFOL  Patient Location: PACU  Anesthesia Type:MAC  Level of Consciousness: sedated  Airway & Oxygen Therapy: Patient Spontanous Breathing  Post-op Assessment: Report given to RN and Post -op Vital signs reviewed and stable  Post vital signs: Reviewed and stable  Last Vitals:  Vitals Value Taken Time  BP 138/76 06/06/23 0757  Temp 36.1 C 06/06/23 0756  Pulse 56 06/06/23 0800  Resp 28 06/06/23 0800  SpO2 99 % 06/06/23 0800  Vitals shown include unfiled device data.  Last Pain:  Vitals:   06/06/23 0756  TempSrc: Temporal  PainSc: Asleep         Complications: No notable events documented.

## 2023-06-07 ENCOUNTER — Encounter: Payer: Self-pay | Admitting: Surgery

## 2023-06-11 ENCOUNTER — Ambulatory Visit (INDEPENDENT_AMBULATORY_CARE_PROVIDER_SITE_OTHER): Payer: Medicare Other | Admitting: Surgery

## 2023-06-11 ENCOUNTER — Encounter: Payer: Self-pay | Admitting: Surgery

## 2023-06-11 VITALS — BP 131/85 | HR 73 | Temp 98.3°F | Ht 65.0 in | Wt 199.6 lb

## 2023-06-11 DIAGNOSIS — K219 Gastro-esophageal reflux disease without esophagitis: Secondary | ICD-10-CM

## 2023-06-11 DIAGNOSIS — K449 Diaphragmatic hernia without obstruction or gangrene: Secondary | ICD-10-CM

## 2023-06-11 NOTE — Patient Instructions (Addendum)
Call and see your heart doctor and speak with him about your risks of coming off of Plavix early for surgery. We only need you to be off for 5 days for this surgery. You will need to decide with your heart doctor about the risks of this.   We have messaged your doctor about this and will discuss it with him.   We will call you with what is decided.

## 2023-06-12 ENCOUNTER — Ambulatory Visit (INDEPENDENT_AMBULATORY_CARE_PROVIDER_SITE_OTHER): Payer: Medicare Other | Admitting: Pulmonary Disease

## 2023-06-12 ENCOUNTER — Encounter: Payer: Self-pay | Admitting: Pulmonary Disease

## 2023-06-12 ENCOUNTER — Other Ambulatory Visit: Payer: Self-pay | Admitting: Cardiology

## 2023-06-12 ENCOUNTER — Telehealth: Payer: Self-pay | Admitting: Surgery

## 2023-06-12 VITALS — BP 146/80 | HR 54 | Temp 97.6°F | Ht 65.0 in | Wt 200.6 lb

## 2023-06-12 DIAGNOSIS — Z87891 Personal history of nicotine dependence: Secondary | ICD-10-CM | POA: Diagnosis not present

## 2023-06-12 DIAGNOSIS — K219 Gastro-esophageal reflux disease without esophagitis: Secondary | ICD-10-CM

## 2023-06-12 DIAGNOSIS — J449 Chronic obstructive pulmonary disease, unspecified: Secondary | ICD-10-CM

## 2023-06-12 DIAGNOSIS — R911 Solitary pulmonary nodule: Secondary | ICD-10-CM

## 2023-06-12 DIAGNOSIS — K449 Diaphragmatic hernia without obstruction or gangrene: Secondary | ICD-10-CM | POA: Diagnosis not present

## 2023-06-12 NOTE — Progress Notes (Signed)
Outpatient Surgical Follow Up   Tammy Lamb is an 72 y.o. female.   Chief Complaint  Patient presents with   Follow-up    Discuss EGD results    HPI: Tammy Lamb is a 72 y.o. female in in consultation at the request of Dr. Jayme Cloud for a giant paraesophageal hernia.  She reports significant reflux with bloating right after eating.  Does have heartburn and regurgitation.  Addition to that she does have significant COPD with chronic cough.  DPI only have partially alleviated reflux symptoms.  She has coronary artery disease and had a drug-eluting stent placed to the right RCA months ago and is currently on dual antiplatelet therapy.  She denies any angina chest pain.  Quit smoking 6 months ago.  She has a 49-pack-year history. Prior hx of cholecystectomy and hysterectomy. 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  She also had a barium swallow showing a large paraesophageal hernia with an upside down stomach.  Significant reflux no strictures. Completed EGD and due to tortuous stomach duodenum could not be reached, No strictures or concerning lesions  I have messaged Dr. [name] and since she has been more than 6 months from the stent it would be ok to hold Plavix but keep ASA.  Past Medical History:  Diagnosis Date   Anemia    h/o   Anxiety    Arthritis    Bilateral carotid artery stenosis    CAD (coronary artery disease), native coronary artery 10/18/2022   DES to RCA ONYX FRONTIER 3.5X15   Chronic lower back pain    Colitis, ulcerative (HCC)    COPD (chronic obstructive pulmonary disease) (HCC)    Depression    Diabetes mellitus without complication (HCC)    GERD (gastroesophageal reflux disease)    H/O bladder infections    History of 2019 novel coronavirus disease (COVID-19) 02/2021   History of hiatal hernia    HLD (hyperlipidemia)    Hypertension    Interstitial cystitis    Low sodium levels     Motion sickness    fair rides   Parkinson's disease (HCC)    suspected per dr Sherryll Burger @ [facility] neuro   Pre-diabetes    PVD (peripheral vascular disease) (HCC)    Sinus headache    Stroke (HCC) 2015   mild stroke d/t coreg dose being too high and pt. had severe hypotension.   TIA (transient ischemic attack) 2015   no deficits.  per pt due to low BP.   Urinary frequency    Wears dentures    partial upper and lower    Past Surgical History:  Procedure Laterality Date   ABDOMINAL HYSTERECTOMY     BSO   ANKLE ARTHROSCOPY Left 03/22/2016   Procedure: ANKLE ARTHROSCOPY DEBRODEMENT EXTENSIVE LEFT FLEXAR TENDON REPAIR;  Surgeon: Gwyneth Revels, DPM;  Location: [facility];  Service: Podiatry;  Laterality: Left;  WITH POPLITEAL   ANTERIOR CERVICAL DECOMP/DISCECTOMY FUSION N/A 04/09/2019   Procedure: ANTERIOR CERVICAL DECOMPRESSION/DISCECTOMY FUSION 1 LEVEL C3-4;  Surgeon: Venetia Night, MD;  Location: [facility];  Service: Neurosurgery;  Laterality: N/A;   BLADDER SUSPENSION     tac   CHOLECYSTECTOMY     COLONOSCOPY     CORONARY STENT INTERVENTION N/A 10/18/2022   Procedure: CORONARY STENT INTERVENTION;  Surgeon: Iran Ouch, MD;  Location: [facility];  Service: Cardiovascular;  Laterality: N/A;   DIAGNOSTIC LAPAROSCOPY  adhesions   ESOPHAGOGASTRODUODENOSCOPY (EGD) WITH PROPOFOL N/A 06/06/2023   Procedure: ESOPHAGOGASTRODUODENOSCOPY (EGD) WITH PROPOFOL;  Surgeon: Sung Amabile, DO;  Location: [facility];  Service: General;  Laterality: N/A;  patient to stay on plavix and aspirin due to recent stent placement, Dr. Tonna Boehringer aware   KNEE ARTHROSCOPY Left 02/13/2014   Procedure: LEFT ARTHROSCOPY KNEE, PARTIAL MEDIAL MENISECTOMY AND PLICA, CHONDROPLASTY OF PATELLA FEMORAL JOINT;  Surgeon: Harvie Junior, MD;  Location: [facility];  Service: Orthopedics;  Laterality: Left;   LEFT HEART CATH AND CORONARY ANGIOGRAPHY N/A 10/18/2022   Procedure: LEFT HEART CATH AND  CORONARY ANGIOGRAPHY;  Surgeon: Iran Ouch, MD;  Location: [facility];  Service: Cardiovascular;  Laterality: N/A;   LUMBAR LAMINECTOMY/DECOMPRESSION MICRODISCECTOMY N/A 05/30/2017   Procedure: LUMBAR LAMINECTOMY/DECOMPRESSION MICRODISCECTOMY 3 LEVELS-L3-S1;  Surgeon: Venetia Night, MD;  Location: [facility];  Service: Neurosurgery;  Laterality: N/A;   LUMBAR LAMINECTOMY/DECOMPRESSION MICRODISCECTOMY N/A 05/27/2021   Procedure: L4-S1 DECOMPRESSION;  Surgeon: Venetia Night, MD;  Location: [facility];  Service: Neurosurgery;  Laterality: N/A;   TENDON REPAIR Left 03/22/2016   Procedure: ZOXWRU TENDON REPAIR;  Surgeon: Gwyneth Revels, DPM;  Location: [facility];  Service: Podiatry;  Laterality: Left;   TONSILLECTOMY     UPPER GI ENDOSCOPY      Family History  Problem Relation Age of Onset   Diabetes Mother    Hypertension Mother    Hypertension Father    COPD Father    Heart disease Father    Breast cancer Neg Hx     Social History:  reports that she has quit smoking. Her smoking use included cigarettes. She has a 49 pack-year smoking history. She has been exposed to tobacco smoke. She has never used smokeless tobacco. She reports that she does not currently use alcohol. She reports that she does not use drugs.  Allergies:  Allergies  Allergen Reactions   Aspirin Other (See Comments)    GERD   Sulfa Antibiotics Nausea And Vomiting    Medications reviewed.    ROS Full ROS performed and is otherwise negative other than what is stated in HPI   BP 131/85 (BP Location: Right Arm, Patient Position: Sitting, Cuff Size: Large)   Pulse 73   Temp 98.3 F (36.8 C) (Oral)   Ht 5\' 5"  (1.651 m)   Wt 199 lb 9.6 oz (90.5 kg)   SpO2 92%   BMI 33.22 kg/m   Physical Exam CONSTITUTIONAL: NAD. EYES: Pupils are equal, round, Sclera are non-icteric. EARS, NOSE, MOUTH AND THROAT: The oropharynx is clear. The oral mucosa is pink and moist. Hearing is intact to  voice. LYMPH NODES:  Lymph nodes in the neck are normal. RESPIRATORY:  Lungs are clear. There is normal respiratory effort, Scattered rhonchi with end expiratory wheezes noted. No pathologic use of accessory muscles. CARDIOVASCULAR: Heart is regular without murmurs, gallops, or rubs. GI: The abdomen is  soft, nontender, and nondistended. There are no palpable masses. There is no hepatosplenomegaly. There are normal bowel sounds GU: Rectal deferred.   MUSCULOSKELETAL: Normal muscle strength and tone. No cyanosis or edema.   SKIN: Turgor is good and there are no pathologic skin lesions or ulcers. NEUROLOGIC: Motor and sensation is grossly normal. Cranial nerves are grossly intact. PSYCH:  Oriented to person, place and time. Affect is normal.   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  I do think that she will definitely benefit from paraesophageal  hernia repair.   We talked about what a robotic paraesophageal hernia will entail.  Modification.  Also we talked about the risk the benefits and the potential complications including but not limited to: Heart attacks, esophageal or bowel perforations, pneumothorax, vascular injuries, bleeding , chronic pain, bloating, diarrhea.  She understands and understands that this needs to be done. I Do think the paraesophageal component is causing significant pulmonary symptoms as well. Plan for RObotic paraesophageal H repair w Partial fundoplication. We will hold Plavix but continue ASA per Dr. [name]  I spent greater than 40 minutes in this encounter including personally reviewing imaging studies, coordinating her care, placing orders and performing documentation.  Sterling Big, MD [facility] General Surgeon

## 2023-06-12 NOTE — Progress Notes (Signed)
Was contacted by surgical team for preop evaluation due to history of stent, currently on aspirin and Plavix.  Patient recently seen by myself in July 2024.  She has a history of stent placement back in March 2024.  Paraesophageal hernia surgery being planned.  Last echo with normal EF.  Okay for surgical procedure from a cardiac perspective.  Okay to hold Plavix, patient has been on DAPT for over 6 months since stent placement.  Continue aspirin 81 mg daily.  Resume Plavix after surgical procedure when deemed appropriate.  Signed, Debbe Odea, M.D. 06/12/23 [facility] [place], [place] [phone]

## 2023-06-12 NOTE — Patient Instructions (Signed)
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 We also reviewed your general health maintenance, including your recent flu shot.  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 Surgery is necessary to prevent serious complications like stomach tissue death. We discussed the risks of surgery and the need to manage your Plavix medication. You agreed to proceed with the surgery due to the significant impact on your breathing and the potential for severe complications.  -CHRONIC OBSTRUCTIVE PULMONARY DISEASE (COPD): COPD is a chronic lung disease often caused by smoking, which makes it hard to breathe. You are currently managing it with Breztri and have not had any flare-ups since your last visit. We will continue with your current medication and plan to follow up in a couple of months to check on your condition.  -LUNG NODULE: A lung nodule is a small growth in the lung. Your recent PET scan showed no signs of uptake, and another spot appears to be inflammation likely related to your hernia. We will keep monitoring the nodule with periodic imaging to ensure it remains benign.  -GENERAL HEALTH MAINTENANCE: You have received your flu shot. It is important to keep your vaccinations up to date to maintain your overall health.  INSTRUCTIONS:  Please schedule your hiatal hernia repair surgery and coordinate with your cardiologist regarding the management of your Plavix medication. We will follow up in a couple of months (2-3 months) to assess your COPD status and monitor your lung nodule.

## 2023-06-12 NOTE — Progress Notes (Signed)
Subjective:    Patient ID: Tammy Lamb, female    DOB: 1951/01/01, 72 y.o.   MRN: [id number]  Patient Care Team: Enid Baas, MD as PCP - General (Internal Medicine) Debbe Odea, MD as PCP - Cardiology (Cardiology) Salena Saner, MD as Consulting Physician (Pulmonary Disease)  Chief Complaint  Patient presents with   Follow-up    Shortness of breath on exertion. No cough or wheezing.     BACKGROUND/INTERVAL:Patient is a 72 year old recent former smoker (March 2024, 49 PY) history as noted below who presents for follow-up of an lung nodule incidentally found on lung cancer screening CT. she was also noted to have a very large hiatal hernia with intrathoracic stomach.  This is likely causing severe issues with shortness of breath for the patient.  She is to undergo surgery for repair on 26 November.  The patient has had stent placement in March for unstable angina stent was placed to the RCA lesion that was the culprit.  The patient has been cleared by cardiology to hold Plavix for the procedure of hernia repair.  No new issues since her prior visit of 01 May 2023.  HPI Discussed the use of AI scribe software for clinical note transcription with the patient, who gave verbal consent to proceed.  History of Present Illness   The patient, a 72 year old with a significant smoking history of 49 pack-years and a diagnosis of COPD, presents for follow-up of a lung nodule and exertional dyspnea. She has a large hiatal hernia, resulting in an intrathoracic stomach, which is significantly impacting her breathing. The patient is currently on Breztri for COPD management and reports no exacerbations since the last visit on May 01, 2023.  The patient's hernia has progressed to the point where her stomach is now located in her chest, causing a sensation of a 'heavy burden.' This has led to increased breathing difficulties, necessitating surgical intervention. The patient has been  proactive in advocating for this surgery, expressing concerns about the potential for worsening symptoms and severe complications, such as stomach necrosis.  In addition to the breathing difficulties, the patient reports occasional expectoration of phlegm, which she attributes to the use of Breztri. Despite these challenges, the patient remains socially active, with plans to attend a Thanksgiving dinner organized by her church. She has also received her flu shot for the season.  The patient's lung nodule has been under surveillance, with recent PET scan results showing no uptake. Another spot in the left lung appears to be an area of inflammation, likely related to the hernia.     Review of Systems A 10 point review of systems was performed and it is as noted above otherwise negative.   Patient Active Problem List   Diagnosis Date Noted   Primary osteoarthritis of right knee 03/27/2023   Chronic pain of left knee 03/27/2023   Influenza A 10/20/2022   Coronavirus infection 10/20/2022   Chest pain 10/19/2022   Coronary artery disease involving native coronary artery of native heart with unstable angina pectoris (HCC) 10/19/2022   Status post primary angioplasty with coronary stent 10/19/2022   Unstable angina (HCC) 10/18/2022   COPD with acute exacerbation (HCC) 10/18/2022   AKI (acute kidney injury) (HCC) 10/18/2022   Ulcerative colitis (HCC) 10/18/2022   Bilateral hip pain 12/26/2021   Thoracic disc herniation 08/25/2021   Radicular pain of thoracic region 08/25/2021   Atherosclerotic peripheral vascular disease with intermittent claudication (HCC) 03/08/2021   Benign essential HTN 03/08/2021  Bilateral carotid artery stenosis 03/08/2021   Hyperlipidemia, mixed 03/08/2021   Bilateral occipital neuralgia 09/16/2020   Chronic SI joint pain 09/16/2020   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   High-tone pelvic floor dysfunction 08/21/2019   Recurrent UTI 08/21/2019   S/P cervical  spinal fusion (C3-C4 ACDF (03/2019) 04/29/2019   Cervicalgia 04/29/2019   Inflammatory spondylopathy of cervical region [facility]) 12/17/2018   Cervical facet joint syndrome 10/01/2018   DDD (degenerative disc disease), cervical 10/01/2018   Chronic radicular cervical pain 10/01/2018   Compression fracture of lumbar vertebra, non-traumatic, sequela 02/28/2018   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/28/2018   Spinal stenosis, lumbar region, with neurogenic claudication 09/27/2017   Lumbar radiculopathy 09/27/2017   Lumbar degenerative disc disease 09/27/2017   Lumbar facet arthropathy 09/27/2017   Chronic pain syndrome 09/27/2017   Neurogenic claudication 05/30/2017    Social History   Tobacco Use   Smoking status: Former    Current packs/day: 0.00    Average packs/day: 1 pack/day for 56.2 years (56.2 ttl pk-yrs)    Types: Cigarettes    Start date: 37    Quit date: 09/22/2022    Years since quitting: 0.7    Passive exposure: Past   Smokeless tobacco: Never   Tobacco comments:    Quit in March of 2024.  Substance Use Topics   Alcohol use: Not Currently    Comment: Occassional,    Allergies  Allergen Reactions   Aspirin Other (See Comments)    GERD   Sulfa Antibiotics Nausea And Vomiting    Current Meds  Medication Sig   acetaminophen (TYLENOL) 500 MG tablet Take 1,000 mg by mouth every 6 (six) hours as needed for moderate pain or headache.   albuterol (VENTOLIN HFA) 108 (90 Base) MCG/ACT inhaler Inhale 2 puffs into the lungs every 6 (six) hours as needed for wheezing or shortness of breath.   ascorbic acid (VITAMIN C) 1000 MG tablet Take 1,000 mg by mouth once a week.   aspirin EC 81 MG tablet Take 1 tablet (81 mg total) by mouth daily. Swallow whole.   atorvastatin (LIPITOR) 80 MG tablet Take 80 mg by mouth at bedtime.   Budeson-Glycopyrrol-Formoterol (BREZTRI AEROSPHERE) 160-9-4.8 MCG/ACT AERO Inhale 2 puffs into the lungs in the morning and at bedtime.    cephALEXin (KEFLEX) 250 MG capsule Take 250 mg by mouth daily.   cholecalciferol (VITAMIN D) 25 MCG (1000 UNIT) tablet Take 1,000 Units by mouth at bedtime.   Cinnamon 500 MG TABS Take 500 mg by mouth in the morning and at bedtime.   citalopram (CELEXA) 20 MG tablet Take 20 mg by mouth every morning.   clopidogrel (PLAVIX) 75 MG tablet Take 1 tablet (75 mg total) by mouth daily with breakfast.   DULoxetine (CYMBALTA) 60 MG capsule Take 60 mg by mouth daily.   gabapentin (NEURONTIN) 300 MG capsule Take 3 capsules (900 mg total) by mouth 2 (two) times daily.   HYDROcodone-acetaminophen (NORCO) 7.5-325 MG tablet Take 1 tablet by mouth every 8 (eight) hours as needed for severe pain. Must last 30 days.   hydrOXYzine (ATARAX/VISTARIL) 25 MG tablet Take 25 mg by mouth at bedtime.   metoprolol tartrate (LOPRESSOR) 25 MG tablet Take 1 tablet (25 mg total) by mouth 2 (two) times daily.   Multiple Vitamins-Minerals (HAIR SKIN NAILS PO) Take 1 tablet by mouth in the morning and at bedtime.   nitroGLYCERIN (NITROSTAT) 0.4 MG SL tablet Place 1 tablet (0.4 mg total) under the tongue  every 5 (five) minutes as needed for chest pain.   Omega-3 Fatty Acids (FISH OIL) 1200 MG CAPS Take 1,200 mg by mouth once a week.   oxybutynin (DITROPAN-XL) 10 MG 24 hr tablet Take 10 mg by mouth at bedtime.   pantoprazole (PROTONIX) 40 MG tablet Take 1 tablet (40 mg total) by mouth daily.   pentosan polysulfate (ELMIRON) 100 MG capsule Take 100 mg by mouth 2 (two) times daily.   sulfaSALAzine (AZULFIDINE) 500 MG tablet Take 500 mg by mouth 2 (two) times daily.   tiZANidine (ZANAFLEX) 4 MG tablet Take 1 tablet (4 mg total) by mouth every 12 (twelve) hours as needed.   TURMERIC CURCUMIN PO Take 1 capsule by mouth daily.   valsartan (DIOVAN) 160 MG tablet Take 160 mg by mouth daily.   vitamin B-12 (CYANOCOBALAMIN) 1000 MCG tablet Take 1,000 mcg by mouth daily.    Immunization History  Administered Date(s) Administered    Fluad Trivalent(High Dose 65+) 05/01/2023   Influenza Inj Mdck Quad Pf 05/17/2021   Janssen (J&J) SARS-COV-2 Vaccination 11/24/2019   Moderna Sars-Covid-2 Vaccination 07/25/2020   PNEUMOCOCCAL CONJUGATE-20 06/28/2022   Tdap 05/17/2021        Objective:   BP (!) 146/80 (BP Location: Left Arm, Patient Position: Sitting, Cuff Size: Normal)   Pulse (!) 54   Temp 97.6 F (36.4 C) (Temporal)   Ht 5\' 5"  (1.651 m)   Wt 200 lb 9.6 oz (91 kg)   SpO2 95%   BMI 33.38 kg/m   SpO2: 95 %  GENERAL: Obese woman, no acute distress, fully ambulatory, no conversational dyspnea. HEAD: Normocephalic, atraumatic.  EYES: Pupils equal, round, reactive to light.  No scleral icterus.  MOUTH: Multiple fillings noted at teeth.  Oral mucosa moist.  No thrush. NECK: Supple. No thyromegaly. Trachea midline. No JVD.  No adenopathy. PULMONARY: Good air entry bilaterally.  Scattered rhonchi with end expiratory wheezes noted. CARDIOVASCULAR: S1 and S2. Regular rate and rhythm.  No rubs, murmurs or gallops heard. ABDOMEN: Obese, otherwise benign. MUSCULOSKELETAL: No joint deformity, no clubbing, no edema.  NEUROLOGIC: No overt focal deficit, no gait disturbance, speech is fluent. SKIN: Intact,warm,dry.  On limited exam, no overt rashes. PSYCH: Mood and behavior normal.  Assessment & Plan:     ICD-10-CM   1. Stage 2 moderate COPD by GOLD classification (HCC)  J44.9     2. Hiatal hernia with GERD  K44.9    K21.9     3. Lung nodule  R91.1     4. 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 Surgical intervention is required to prevent complications such as gastric necrosis. Risks of surgery, including potential complications and Plavix cessation management, were discussed. The patient understands and agrees to the urgency of surgery due  to significant breathing impact and potential severe complications. - Schedule hiatal hernia repair surgery - Coordinate with cardiologist for Plavix cessation management  Chronic Obstructive Pulmonary Disease (COPD) Former smoker with a 49 pack-year history, currently managed on Breztri. No exacerbations since the last visit, occasional phlegm production. Lung sounds clear on examination. - Continue Breztri - Follow up in a couple of months to assess COPD status  Lung Nodule Previously identified lung nodule showed no uptake on PET scan, likely benign. Another spot on the left side  appears to be inflammation, possibly related to the hernia. - Monitor lung nodule with periodic imaging  General Health Maintenance Received flu shot. - Ensure up-to-date vaccinations  Follow-up - Follow up in 2-3 months.      Gailen Shelter, MD Advanced Bronchoscopy PCCM  [facility]    *This note was generated using voice recognition software/Dragon and/or AI transcription program.  Despite best efforts to proofread, errors can occur which can change the meaning. Any transcriptional errors that result from this process are unintentional and may not be fully corrected at the time of dictation.

## 2023-06-12 NOTE — Telephone Encounter (Signed)
Patient has been advised of Pre-Admission date/time, and Surgery date at [facility].  Surgery Date: 06/19/23 Preadmission Testing Date: 06/13/23 (phone 8a-1p)  Patient has been made aware to call [phone], between 1-3:00pm the day before surgery, to find out what time to arrive for surgery.

## 2023-06-12 NOTE — H&P (View-Only) (Signed)
Outpatient Surgical Follow Up   Tammy Lamb is an 72 y.o. female.   Chief Complaint  Patient presents with   Follow-up    Discuss EGD results    HPI: Tammy Lamb is a 72 y.o. female in in consultation at the request of Dr. Jayme Cloud for a giant paraesophageal hernia.  She reports significant reflux with bloating right after eating.  Does have heartburn and regurgitation.  Addition to that she does have significant COPD with chronic cough.  DPI only have partially alleviated reflux symptoms.  She has coronary artery disease and had a drug-eluting stent placed to the right RCA months ago and is currently on dual antiplatelet therapy.  She denies any angina chest pain.  Quit smoking 6 months ago.  She has a 49-pack-year history. Prior hx of cholecystectomy and hysterectomy. 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  She also had a barium swallow showing a large paraesophageal hernia with an upside down stomach.  Significant reflux no strictures. Completed EGD and due to tortuous stomach duodenum could not be reached, No strictures or concerning lesions  I have messaged Dr. [name] and since she has been more than 6 months from the stent it would be ok to hold Plavix but keep ASA.  Past Medical History:  Diagnosis Date   Anemia    h/o   Anxiety    Arthritis    Bilateral carotid artery stenosis    CAD (coronary artery disease), native coronary artery 10/18/2022   DES to RCA ONYX FRONTIER 3.5X15   Chronic lower back pain    Colitis, ulcerative (HCC)    COPD (chronic obstructive pulmonary disease) (HCC)    Depression    Diabetes mellitus without complication (HCC)    GERD (gastroesophageal reflux disease)    H/O bladder infections    History of 2019 novel coronavirus disease (COVID-19) 02/2021   History of hiatal hernia    HLD (hyperlipidemia)    Hypertension    Interstitial cystitis    Low sodium levels     Motion sickness    fair rides   Parkinson's disease (HCC)    suspected per dr Sherryll Burger @ [facility] neuro   Pre-diabetes    PVD (peripheral vascular disease) (HCC)    Sinus headache    Stroke (HCC) 2015   mild stroke d/t coreg dose being too high and pt. had severe hypotension.   TIA (transient ischemic attack) 2015   no deficits.  per pt due to low BP.   Urinary frequency    Wears dentures    partial upper and lower    Past Surgical History:  Procedure Laterality Date   ABDOMINAL HYSTERECTOMY     BSO   ANKLE ARTHROSCOPY Left 03/22/2016   Procedure: ANKLE ARTHROSCOPY DEBRODEMENT EXTENSIVE LEFT FLEXAR TENDON REPAIR;  Surgeon: Gwyneth Revels, DPM;  Location: [facility];  Service: Podiatry;  Laterality: Left;  WITH POPLITEAL   ANTERIOR CERVICAL DECOMP/DISCECTOMY FUSION N/A 04/09/2019   Procedure: ANTERIOR CERVICAL DECOMPRESSION/DISCECTOMY FUSION 1 LEVEL C3-4;  Surgeon: Venetia Night, MD;  Location: [facility];  Service: Neurosurgery;  Laterality: N/A;   BLADDER SUSPENSION     tac   CHOLECYSTECTOMY     COLONOSCOPY     CORONARY STENT INTERVENTION N/A 10/18/2022   Procedure: CORONARY STENT INTERVENTION;  Surgeon: Iran Ouch, MD;  Location: [facility];  Service: Cardiovascular;  Laterality: N/A;   DIAGNOSTIC LAPAROSCOPY  adhesions   ESOPHAGOGASTRODUODENOSCOPY (EGD) WITH PROPOFOL N/A 06/06/2023   Procedure: ESOPHAGOGASTRODUODENOSCOPY (EGD) WITH PROPOFOL;  Surgeon: Sung Amabile, DO;  Location: [facility];  Service: General;  Laterality: N/A;  patient to stay on plavix and aspirin due to recent stent placement, Dr. Tonna Boehringer aware   KNEE ARTHROSCOPY Left 02/13/2014   Procedure: LEFT ARTHROSCOPY KNEE, PARTIAL MEDIAL MENISECTOMY AND PLICA, CHONDROPLASTY OF PATELLA FEMORAL JOINT;  Surgeon: Harvie Junior, MD;  Location: [facility];  Service: Orthopedics;  Laterality: Left;   LEFT HEART CATH AND CORONARY ANGIOGRAPHY N/A 10/18/2022   Procedure: LEFT HEART CATH AND  CORONARY ANGIOGRAPHY;  Surgeon: Iran Ouch, MD;  Location: [facility];  Service: Cardiovascular;  Laterality: N/A;   LUMBAR LAMINECTOMY/DECOMPRESSION MICRODISCECTOMY N/A 05/30/2017   Procedure: LUMBAR LAMINECTOMY/DECOMPRESSION MICRODISCECTOMY 3 LEVELS-L3-S1;  Surgeon: Venetia Night, MD;  Location: [facility];  Service: Neurosurgery;  Laterality: N/A;   LUMBAR LAMINECTOMY/DECOMPRESSION MICRODISCECTOMY N/A 05/27/2021   Procedure: L4-S1 DECOMPRESSION;  Surgeon: Venetia Night, MD;  Location: [facility];  Service: Neurosurgery;  Laterality: N/A;   TENDON REPAIR Left 03/22/2016   Procedure: GNFAOZ TENDON REPAIR;  Surgeon: Gwyneth Revels, DPM;  Location: [facility];  Service: Podiatry;  Laterality: Left;   TONSILLECTOMY     UPPER GI ENDOSCOPY      Family History  Problem Relation Age of Onset   Diabetes Mother    Hypertension Mother    Hypertension Father    COPD Father    Heart disease Father    Breast cancer Neg Hx     Social History:  reports that she has quit smoking. Her smoking use included cigarettes. She has a 49 pack-year smoking history. She has been exposed to tobacco smoke. She has never used smokeless tobacco. She reports that she does not currently use alcohol. She reports that she does not use drugs.  Allergies:  Allergies  Allergen Reactions   Aspirin Other (See Comments)    GERD   Sulfa Antibiotics Nausea And Vomiting    Medications reviewed.    ROS Full ROS performed and is otherwise negative other than what is stated in HPI   BP 131/85 (BP Location: Right Arm, Patient Position: Sitting, Cuff Size: Large)   Pulse 73   Temp 98.3 F (36.8 C) (Oral)   Ht 5\' 5"  (1.651 m)   Wt 199 lb 9.6 oz (90.5 kg)   SpO2 92%   BMI 33.22 kg/m   Physical Exam CONSTITUTIONAL: NAD. EYES: Pupils are equal, round, Sclera are non-icteric. EARS, NOSE, MOUTH AND THROAT: The oropharynx is clear. The oral mucosa is pink and moist. Hearing is intact to  voice. LYMPH NODES:  Lymph nodes in the neck are normal. RESPIRATORY:  Lungs are clear. There is normal respiratory effort, Scattered rhonchi with end expiratory wheezes noted. No pathologic use of accessory muscles. CARDIOVASCULAR: Heart is regular without murmurs, gallops, or rubs. GI: The abdomen is  soft, nontender, and nondistended. There are no palpable masses. There is no hepatosplenomegaly. There are normal bowel sounds GU: Rectal deferred.   MUSCULOSKELETAL: Normal muscle strength and tone. No cyanosis or edema.   SKIN: Turgor is good and there are no pathologic skin lesions or ulcers. NEUROLOGIC: Motor and sensation is grossly normal. Cranial nerves are grossly intact. PSYCH:  Oriented to person, place and time. Affect is normal.   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  I do think that she will definitely benefit from paraesophageal  hernia repair.   We talked about what a robotic paraesophageal hernia will entail.  Modification.  Also we talked about the risk the benefits and the potential complications including but not limited to: Heart attacks, esophageal or bowel perforations, pneumothorax, vascular injuries, bleeding , chronic pain, bloating, diarrhea.  She understands and understands that this needs to be done. I Do think the paraesophageal component is causing significant pulmonary symptoms as well. Plan for RObotic paraesophageal H repair w Partial fundoplication. We will hold Plavix but continue ASA per Dr. [name]  I spent greater than 40 minutes in this encounter including personally reviewing imaging studies, coordinating her care, placing orders and performing documentation.  Sterling Big, MD [facility] General Surgeon

## 2023-06-13 ENCOUNTER — Encounter
Admission: RE | Admit: 2023-06-13 | Discharge: 2023-06-13 | Disposition: A | Discharge status: Home / Self Care | Payer: Medicare Other | Source: Ambulatory Visit | Attending: Surgery | Admitting: Surgery

## 2023-06-13 ENCOUNTER — Other Ambulatory Visit: Payer: Self-pay

## 2023-06-13 DIAGNOSIS — Z01812 Encounter for preprocedural laboratory examination: Secondary | ICD-10-CM

## 2023-06-13 DIAGNOSIS — I10 Essential (primary) hypertension: Secondary | ICD-10-CM

## 2023-06-13 HISTORY — DX: Dyspnea, unspecified: R06.00

## 2023-06-13 NOTE — Patient Instructions (Addendum)
Your procedure is scheduled on: 06/19/23 - Tuesday Report to the Registration Desk on the 1st floor of the [facility]. To find out your arrival time, please call [phone] between 1PM - 3PM on: 06/18/23 - Monday If your arrival time is 6:00 am, do not arrive before that time as the [facility] entrance doors do not open until 6:00 am.  REMEMBER: Instructions that are not followed completely may result in serious medical risk, up to and including death; or upon the discretion of your surgeon and anesthesiologist your surgery may need to be rescheduled.  Do not eat food after midnight the night before surgery.  No gum chewing or hard candies.  One week prior to surgery: Stop Anti-inflammatories (NSAIDS) such as Advil, Aleve, Ibuprofen, Motrin, Naproxen, Naprosyn and Aspirin based products such as Excedrin, Goody's Powder, BC Powder. You may take Tylenol if needed for pain up until the day of surgery.  Stop ANY OVER THE COUNTER supplements until after surgery.  HOLD clopidogrel (PLAVIX) beginning 06/14/23.  HOLD valsartan (DIOVAN) on the day of surgery.  ON THE DAY OF SURGERY ONLY TAKE THESE MEDICATIONS WITH SIPS OF WATER:  BREZTRI AEROSPHERE  cephALEXin (KEFLEX) citalopram (CELEXA)   DULoxetine (CYMBALTA)  gabapentin (NEURONTIN)  metoprolol tartrate  pantoprazole (PROTONIX)  pentosan polysulfate  sulfaSALAzine (AZULFIDINE)   Use inhalers albuterol (VENTOLIN HFA)  on the day of surgery and bring to the hospital.  No Alcohol for 24 hours before or after surgery.  No Smoking including e-cigarettes for 24 hours before surgery.  No chewable tobacco products for at least 6 hours before surgery.  No nicotine patches on the day of surgery.  Do not use any "recreational" drugs for at least a week (preferably 2 weeks) before your surgery.  Please be advised that the combination of cocaine and anesthesia may have negative outcomes, up to and including death. If you test positive  for cocaine, your surgery will be cancelled.  On the morning of surgery brush your teeth with toothpaste and water, you may rinse your mouth with mouthwash if you wish. Do not swallow any toothpaste or mouthwash.  Use CHG Soap or wipes as directed on instruction sheet.  Do not wear jewelry, make-up, hairpins, clips or nail polish.  For welded (permanent) jewelry: bracelets, anklets, waist bands, etc.  Please have this removed prior to surgery.  If it is not removed, there is a chance that hospital personnel will need to cut it off on the day of surgery.  Do not wear lotions, powders, or perfumes.   Do not shave body hair from the neck down 48 hours before surgery.  Contact lenses, hearing aids and dentures may not be worn into surgery.  Do not bring valuables to the hospital. [facility] is not responsible for any missing/lost belongings or valuables.   Notify your doctor if there is any change in your medical condition (cold, fever, infection).  Wear comfortable clothing (specific to your surgery type) to the hospital.  After surgery, you can help prevent lung complications by doing breathing exercises.  Take deep breaths and cough every 1-2 hours. Your doctor may order a device called an Incentive Spirometer to help you take deep breaths. When coughing or sneezing, hold a pillow firmly against your incision with both hands. This is called "splinting." Doing this helps protect your incision. It also decreases belly discomfort.  If you are being admitted to the hospital overnight, leave your suitcase in the car. After surgery it may be  brought to your room.  In case of increased patient census, it may be necessary for you, the patient, to continue your postoperative care in the Same Day Surgery department.  If you are being discharged the day of surgery, you will not be allowed to drive home. You will need a responsible individual to drive you home and stay with you for 24 hours after  surgery.   If you are taking public transportation, you will need to have a responsible individual with you.  Please call the Pre-admissions Testing Dept. at [phone] if you have any questions about these instructions.  Surgery Visitation Policy:  Patients having surgery or a procedure may have two visitors.  Children under the age of 34 must have an adult with them who is not the patient.  Inpatient Visitation:    Visiting hours are 7 a.m. to 8 p.m. Up to four visitors are allowed at one time in a patient room. The visitors may rotate out with other people during the day.  One visitor age 70 or older may stay with the patient overnight and must be in the room by 8 p.m.     Preparing for Surgery with CHLORHEXIDINE GLUCONATE (CHG) Soap  Chlorhexidine Gluconate (CHG) Soap  o An antiseptic cleaner that kills germs and bonds with the skin to continue killing germs even after washing  o Used for showering the night before surgery and morning of surgery  Before surgery, you can play an important role by reducing the number of germs on your skin.  CHG (Chlorhexidine gluconate) soap is an antiseptic cleanser which kills germs and bonds with the skin to continue killing germs even after washing.  Please do not use if you have an allergy to CHG or antibacterial soaps. If your skin becomes reddened/irritated stop using the CHG.  1. Shower the NIGHT BEFORE SURGERY and the MORNING OF SURGERY with CHG soap.  2. If you choose to wash your hair, wash your hair first as usual with your normal shampoo.  3. After shampooing, rinse your hair and body thoroughly to remove the shampoo.  4. Use CHG as you would any other liquid soap. You can apply CHG directly to the skin and wash gently with a scrungie or a clean washcloth.  5. Apply the CHG soap to your body only from the neck down. Do not use on open wounds or open sores. Avoid contact with your eyes, ears, mouth, and genitals (private  parts). Wash face and genitals (private parts) with your normal soap.  6. Wash thoroughly, paying special attention to the area where your surgery will be performed.  7. Thoroughly rinse your body with warm water.  8. Do not shower/wash with your normal soap after using and rinsing off the CHG soap.  9. Pat yourself dry with a clean towel.  10. Wear clean pajamas to bed the night before surgery.  12. Place clean sheets on your bed the night of your first shower and do not sleep with pets.  13. Shower again with the CHG soap on the day of surgery prior to arriving at the hospital.  14. Do not apply any deodorants/lotions/powders.  15. Please wear clean clothes to the hospital.

## 2023-06-14 ENCOUNTER — Other Ambulatory Visit: Payer: Self-pay

## 2023-06-14 ENCOUNTER — Telehealth: Payer: Self-pay

## 2023-06-14 ENCOUNTER — Encounter
Admission: RE | Admit: 2023-06-14 | Discharge: 2023-06-14 | Disposition: A | Discharge status: Home / Self Care | Payer: Medicare Other | Source: Ambulatory Visit | Attending: Surgery | Admitting: Surgery

## 2023-06-14 ENCOUNTER — Telehealth: Payer: Self-pay | Admitting: Cardiology

## 2023-06-14 DIAGNOSIS — I10 Essential (primary) hypertension: Secondary | ICD-10-CM | POA: Diagnosis not present

## 2023-06-14 DIAGNOSIS — R001 Bradycardia, unspecified: Secondary | ICD-10-CM | POA: Diagnosis not present

## 2023-06-14 DIAGNOSIS — Z01812 Encounter for preprocedural laboratory examination: Secondary | ICD-10-CM

## 2023-06-14 DIAGNOSIS — Z01818 Encounter for other preprocedural examination: Secondary | ICD-10-CM | POA: Diagnosis present

## 2023-06-14 DIAGNOSIS — Z01810 Encounter for preprocedural cardiovascular examination: Secondary | ICD-10-CM | POA: Diagnosis not present

## 2023-06-14 LAB — BASIC METABOLIC PANEL
Anion gap: 9 (ref 5–15)
BUN: 9 mg/dL (ref 8–23)
CO2: 26 mmol/L (ref 22–32)
Calcium: 8.9 mg/dL (ref 8.9–10.3)
Chloride: 101 mmol/L (ref 98–111)
Creatinine, Ser: 0.84 mg/dL (ref 0.44–1.00)
GFR, Estimated: >60 mL/min (ref >60)
Glucose, Bld: 83 mg/dL (ref 70–99)
Potassium: 4.2 mmol/L (ref 3.5–5.1)
Sodium: 136 mmol/L (ref 135–145)

## 2023-06-14 LAB — CBC
HCT: 31.7 % — ABNORMAL LOW (ref 36.0–46.0)
Hemoglobin: 10.3 g/dL — ABNORMAL LOW (ref 12.0–15.0)
MCH: 27 pg (ref 26.0–34.0)
MCHC: 32.5 g/dL (ref 30.0–36.0)
MCV: 83 fL (ref 80.0–100.0)
Platelets: 366 10*3/uL (ref 150–400)
RBC: 3.82 MIL/uL — ABNORMAL LOW (ref 3.87–5.11)
RDW: 15.1 % (ref 11.5–15.5)
WBC: 5.6 10*3/uL (ref 4.0–10.5)
nRBC: 0 % (ref 0.0–0.2)

## 2023-06-14 MED ORDER — PANTOPRAZOLE SODIUM 40 MG PO TBEC: 40.0000 mg | DELAYED_RELEASE_TABLET | Freq: Every day | ORAL | 1 refills | Status: DC

## 2023-06-14 MED ORDER — METOPROLOL TARTRATE 25 MG PO TABS: 25.0000 mg | ORAL_TABLET | Freq: Two times a day (BID) | ORAL | 0 refills | Status: DC

## 2023-06-14 NOTE — Telephone Encounter (Signed)
She is having hernia surgery on 12/26 and wants to know if her Dec. 3 med refill can be changed to a VV this time

## 2023-06-14 NOTE — Telephone Encounter (Signed)
Requested Prescriptions   Signed Prescriptions Disp Refills   metoprolol tartrate (LOPRESSOR) 25 MG tablet 180 tablet 0    Sig: Take 1 tablet (25 mg total) by mouth 2 (two) times daily.    Authorizing Provider: Flossie Dibble    Ordering User: Kendrick Fries

## 2023-06-14 NOTE — Telephone Encounter (Signed)
Last office visit 01/26/23 with plan to f/u in 6 months next office visit: none/active recall   Requested Prescriptions   Signed Prescriptions Disp Refills   pantoprazole (PROTONIX) 40 MG tablet 30 tablet 1    Sig: Take 1 tablet (40 mg total) by mouth daily.    Authorizing Provider: Debbe Odea    Ordering User: Guerry Minors

## 2023-06-14 NOTE — Telephone Encounter (Signed)
*  STAT* If patient is at the pharmacy, call can be transferred to refill team.   1. Which medications need to be refilled? (please list name of each medication and dose if known)   metoprolol tartrate (LOPRESSOR) 25 MG tablet   2. Which pharmacy/location (including street and city if local pharmacy) is medication to be sent to? [id number] [place], [place] - [address] [facility] Phone: [phone]  Fax: [phone]     3. Do they need a 30 day or 90 day supply? 90

## 2023-06-17 ENCOUNTER — Encounter: Payer: Self-pay | Admitting: Surgery

## 2023-06-18 ENCOUNTER — Encounter: Payer: Self-pay | Admitting: Surgery

## 2023-06-18 NOTE — Progress Notes (Signed)
Perioperative / Anesthesia Services  Pre-Admission Testing Clinical Review / Pre-Operative Anesthesia Consult  Date: 06/18/23  Patient Demographics:  Name: Tammy Lamb DOB:   1951/04/05 MRN:   [id number]  Planned Surgical Procedure(s):    Case: [id number] Date/Time: 06/19/23 0715   Procedure: XI ROBOTIC ASSISTED PARAESOPHAGEAL HERNIA REPAIR, RNFA to assist   Anesthesia type: General   Pre-op diagnosis: hiatal hernia   Location: [facility] OR ROOM 06 / [facility]   Surgeons: Leafy Ro, MD     NOTE: Available PAT nursing documentation and vital signs have been reviewed. Clinical nursing staff has updated patient's PMH/PSHx, current medication list, and drug allergies/intolerances to ensure comprehensive history available to assist in medical decision making as it pertains to the aforementioned surgical procedure and anticipated anesthetic course. Extensive review of available clinical information personally performed. Tammy Lamb PMH and PSHx updated with any diagnoses/procedures that  may have been inadvertently omitted during her intake with the pre-admission testing department's nursing staff.  Clinical Discussion:  Tammy Lamb is a 72 y.o. female who is submitted for pre-surgical anesthesia review and clearance prior to her undergoing the above procedure. Patient is a Former Smoker (56.2 pack years; quit 09/2022). Pertinent PMH includes: CAD, diastolic dysfunction, TIA, PVD, BILATERAL carotid artery disease, aortic atherosclerosis, HTN, HLD, T2DM, pulmonary hypertension, COPD, dyspnea, GERD (on daily PPI), paraesophageal hernia, hiatal hernia, anemia, OA, chronic lower back pain, cervical disc disease (s/p ACDF C3-C4), lumbar disc disease (s/p laminectomy and decompression), possible Parkinson's disease, anxiety.  Patient is followed by cardiology Tammy Cecil, MD). She was last seen in the cardiology clinic on 01/26/2023; notes reviewed. At the time of her clinic  visit, patient doing well overall from a cardiovascular perspective. Patient denied any chest pain, shortness of breath, PND, orthopnea, palpitations, significant peripheral edema, weakness, fatigue, vertiginous symptoms, or presyncope/syncope. Patient with a past medical history significant for cardiovascular diagnoses. Documented physical exam was grossly benign, providing no evidence of acute exacerbation and/or decompensation of the patient's known cardiovascular conditions.  Myocardial perfusion imaging study was performed on 04/11/2021 revealing no evidence of stress-induced myocardial ischemia or arrhythmia; no scintigraphic evidence of scar.  Study determined to be normal and low risk.  Coronary CTA was performed on 10/05/2022 that demonstrated an Agatston coronary artery calcium score of 601. This placed patient in the 92nd percentile for age, sex, and race matched controls. Calcium depositions noted to be isolated mainly in the LAD (383) and RCA (217) distributions.  Study demonstrates normal coronary origin with RIGHT dominance.  Patient underwent diagnostic LEFT heart catheterization on 10/18/2022 revealing a single 85% lesion to the proximal RCA.  PCI was subsequently performed placing a 3.5 x 15 mm Onyx Frontier DES x 1 to the proximal RCA lesion.  Procedure yielded excellent angiographic result and TIMI-3 flow.  Most recent TTE was performed on 12/04/2022 revealing normal left ventricular systolic function with an EF of >55%.  There were no regional wall motion abnormalities. Left ventricular diastolic Doppler parameters consistent with abnormal relaxation (G1DD).  Left atrium mildly enlarged.  Right ventricular size and function normal.  There was trivial aortic, mitral, and pulmonary valve regurgitation.  Additionally, there was moderate tricuspid valve regurgitation.  PASP elevated at 45 mmHg consistent with known pulmonary hypertension (previously 42.9 mmHg in 03/2021). All transvalvular  gradients were noted to be normal providing no evidence suggestive of valvular stenosis. Aorta normal in size with no evidence of aneurysmal dilatation.  Following recent stent placement, patient remains on  daily DAPT therapy using ASA + clopidogrel.  Patient is reported be compliant with therapy with no evidence or reports of GI/GU related bleeding.  Blood pressure well controlled at 122/76 mmHg on currently prescribed beta-blocker (metoprolol tartrate) and ARB (valsartan) therapies.  Patient is on atorvastatin + omega-3 fatty acid capsule daily for her HLD diagnosis and ASCVD prevention. T2DM well controlled on currently prescribed regimen; last HgbA1c was 6.4% when checked on 12/15/2021. She does not have an OSAH diagnosis. Patient is able to complete all of her  ADL/IADLs without cardiovascular limitation.  Per the DASI, patient is able to achieve at least 4 METS of physical activity without experiencing any significant degree of angina/anginal equivalent symptoms. No changes were made to her medication regimen during her visit with cardiology.  Patient scheduled to follow-up with outpatient cardiology in 6 months or sooner if needed.  Tammy Lamb is scheduled for an elective XI ROBOTIC ASSISTED PARAESOPHAGEAL HERNIA REPAIR, RNFA to assist on 06/19/2023 with Dr. Sterling Big, MD.  Given patient's past medical history significant for cardiovascular diagnoses, presurgical cardiac clearance was sought by the PAT team.  Per cardiology, "was contacted by surgical team for preop evaluation due to history of stent, currently on aspirin and Plavix. Patient recently seen by myself in July 2024. She has a history of stent placement back in March 2024. Paraesophageal hernia surgery being planned.  Last echo with normal EF. Okay for surgical procedure from a cardiac perspective".  Again, this patient Is on daily DAPT therapy following recent stent placement.  Cardiology indicated the patient has been on DAPT therapy  for over 6 months at that point.  She has been cleared to hold her clopidogrel dose for 5 days prior to her procedure with plans to restart since postoperatively respectively minimized by her primary attending surgeon.  The patient is aware that her last dose of clopidogrel should be on 06/13/2023.  Given her cardiovascular conditions, cardiology has requested the patient remain on her daily low-dose ASA throughout her perioperative course.  Patient has been updated on these recommendations from her surgeon and cardiology.  Patient denies previous perioperative complications with anesthesia in the past. In review of the available records, it is noted that patient underwent a general anesthetic course here at [facility] (ASA III) in 05/2023 without documented complications.      06/12/2023    1:17 PM 06/11/2023    1:45 PM 06/06/2023    8:16 AM  Vitals with BMI  Height 5\' 5"  5\' 5"    Weight 200 lbs 10 oz 199 lbs 10 oz   BMI 33.38 33.22   Systolic 146 131 638  Diastolic 80 85 105  Pulse 54 73 54    Providers/Specialists:   NOTE: Primary physician provider listed below. Patient may have been seen by APP or partner within same practice.   PROVIDER ROLE / SPECIALTY LAST Cherly Hensen, MD General Surgery (Surgeon) 06/11/2023  Enid Baas, MD Primary Care Provider 02/23/2023  Debbe Odea, MD Cardiology 01/26/2023  Sarina Ser, MD Pulmonary Medicine 06/12/2023  Edward Jolly, MD Pain Management 03/28/2023   Allergies:  Aspirin and Sulfa antibiotics  Current Home Medications:   No current facility-administered medications for this encounter.    acetaminophen (TYLENOL) 500 MG tablet   albuterol (VENTOLIN HFA) 108 (90 Base) MCG/ACT inhaler   ascorbic acid (VITAMIN C) 1000 MG tablet   aspirin EC 81 MG tablet   atorvastatin (LIPITOR) 80 MG tablet  Budeson-Glycopyrrol-Formoterol (BREZTRI AEROSPHERE) 160-9-4.8 MCG/ACT AERO    cephALEXin (KEFLEX) 250 MG capsule   cholecalciferol (VITAMIN D) 25 MCG (1000 UNIT) tablet   Cinnamon 500 MG TABS   citalopram (CELEXA) 20 MG tablet   clopidogrel (PLAVIX) 75 MG tablet   DULoxetine (CYMBALTA) 60 MG capsule   gabapentin (NEURONTIN) 300 MG capsule   HYDROcodone-acetaminophen (NORCO) 7.5-325 MG tablet   hydrOXYzine (ATARAX/VISTARIL) 25 MG tablet   Multiple Vitamins-Minerals (HAIR SKIN NAILS PO)   nitroGLYCERIN (NITROSTAT) 0.4 MG SL tablet   Omega-3 Fatty Acids (FISH OIL) 1200 MG CAPS   oxybutynin (DITROPAN-XL) 10 MG 24 hr tablet   pentosan polysulfate (ELMIRON) 100 MG capsule   sulfaSALAzine (AZULFIDINE) 500 MG tablet   tiZANidine (ZANAFLEX) 4 MG tablet   TURMERIC CURCUMIN PO   valsartan (DIOVAN) 160 MG tablet   vitamin B-12 (CYANOCOBALAMIN) 1000 MCG tablet   HYDROcodone-acetaminophen (NORCO) 7.5-325 MG tablet   metoprolol tartrate (LOPRESSOR) 25 MG tablet   pantoprazole (PROTONIX) 40 MG tablet    albuterol (PROVENTIL) (2.5 MG/3ML) 0.083% nebulizer solution 2.5 mg   History:   Past Medical History:  Diagnosis Date   Anemia    Anxiety    Aortic atherosclerosis (HCC)    Arthritis    Bilateral carotid artery stenosis    CAD (coronary artery disease), native coronary artery 10/05/2022   a.) cCTA 10/05/2022: Ca2+ = 601 (92nd %ile); LAD 383, RCA 217; b.) LHC 10/18/2022: 85% pRCA ( 3.5 x 15 mm Onyx Frontier DES)   Cerebral microvascular disease    Chronic lower back pain    Colitis, ulcerative (HCC)    COPD (chronic obstructive pulmonary disease) (HCC)    DDD (degenerative disc disease), cervical    a.) s/p ACDF C3-C4 04/09/2019   Depression    Diastolic dysfunction 10/19/2022   a.) TTE 10/19/2022: EF 60-65%, no RWMAs,  G1DD, normal RVSF; b.) TTE 12/04/2022: EF >55%. no RWMAs, G1DD, mild LAE, triv AR/MR/PR, mod TR, PASP 45   Dyspnea    GERD (gastroesophageal reflux disease)    Hiatal hernia    History of 2019 novel coronavirus disease (COVID-19) 02/2021   HLD  (hyperlipidemia)    Hypertension    Interstitial cystitis    Long-term use of aspirin therapy    Lumbar disc disease    a.) s/p L3-L5 laminectomy, decompression, and microdiscectomy 05/30/2017; b.) s/p L4-S1 decompression 05/27/2021   Motion sickness    fair rides   On chronic clopidogrel therapy    Paraesophageal hernia    Parkinson's disease (HCC)    suspected per dr Sherryll Burger @ [facility] neuro   Pulmonary hypertension (HCC) 04/18/2021   a.) TTE 04/18/2021: PASP 42.9; b.) TTE 12/04/2022: PASP 45   PVD (peripheral vascular disease) (HCC)    T2DM (type 2 diabetes mellitus) (HCC)    TIA (transient ischemic attack) 2015   a.) no deficits; per pt her carvedilol dose was too high causing hypotension   Urinary frequency    Wears dentures    partial upper and lower   Past Surgical History:  Procedure Laterality Date   ANKLE ARTHROSCOPY Left 03/22/2016   Procedure: ANKLE ARTHROSCOPY DEBRODEMENT EXTENSIVE LEFT FLEXAR TENDON REPAIR;  Surgeon: Gwyneth Revels, DPM;  Location: [facility];  Service: Podiatry;  Laterality: Left;  WITH POPLITEAL   ANTERIOR CERVICAL DECOMP/DISCECTOMY FUSION N/A 04/09/2019   Procedure: ANTERIOR CERVICAL DECOMPRESSION/DISCECTOMY FUSION 1 LEVEL C3-4;  Surgeon: Venetia Night, MD;  Location: [facility] ORS;  Service: Neurosurgery;  Laterality: N/A;   BLADDER SUSPENSION  CHOLECYSTECTOMY     COLONOSCOPY     CORONARY STENT INTERVENTION N/A 10/18/2022   Procedure: CORONARY STENT INTERVENTION;  Surgeon: Iran Ouch, MD;  Location: [facility];  Service: Cardiovascular;  Laterality: N/A;   DIAGNOSTIC LAPAROSCOPY     adhesions   ESOPHAGOGASTRODUODENOSCOPY (EGD) WITH PROPOFOL N/A 06/06/2023   Procedure: ESOPHAGOGASTRODUODENOSCOPY (EGD) WITH PROPOFOL;  Surgeon: Sung Amabile, DO;  Location: [facility];  Service: General;  Laterality: N/A;  patient to stay on plavix and aspirin due to recent stent placement, Dr. Tonna Boehringer aware   HYSTERECTOMY ABDOMINAL WITH  SALPINGO-OOPHORECTOMY     KNEE ARTHROSCOPY Left 02/13/2014   Procedure: LEFT ARTHROSCOPY KNEE, PARTIAL MEDIAL MENISECTOMY AND PLICA, CHONDROPLASTY OF PATELLA FEMORAL JOINT;  Surgeon: Harvie Junior, MD;  Location: [facility];  Service: Orthopedics;  Laterality: Left;   LEFT HEART CATH AND CORONARY ANGIOGRAPHY N/A 10/18/2022   Procedure: LEFT HEART CATH AND CORONARY ANGIOGRAPHY;  Surgeon: Iran Ouch, MD;  Location: [facility];  Service: Cardiovascular;  Laterality: N/A;   LUMBAR LAMINECTOMY/DECOMPRESSION MICRODISCECTOMY N/A 05/30/2017   Procedure: LUMBAR LAMINECTOMY/DECOMPRESSION MICRODISCECTOMY 3 LEVELS-L3-S1;  Surgeon: Venetia Night, MD;  Location: [facility];  Service: Neurosurgery;  Laterality: N/A;   LUMBAR LAMINECTOMY/DECOMPRESSION MICRODISCECTOMY N/A 05/27/2021   Procedure: L4-S1 DECOMPRESSION;  Surgeon: Venetia Night, MD;  Location: [facility];  Service: Neurosurgery;  Laterality: N/A;   TENDON REPAIR Left 03/22/2016   Procedure: BJYNWG TENDON REPAIR;  Surgeon: Gwyneth Revels, DPM;  Location: [facility];  Service: Podiatry;  Laterality: Left;   TONSILLECTOMY     Family History  Problem Relation Age of Onset   Diabetes Mother    Hypertension Mother    Hypertension Father    COPD Father    Heart disease Father    Breast cancer Neg Hx    Social History   Tobacco Use   Smoking status: Former    Current packs/day: 0.00    Average packs/day: 1 pack/day for 56.2 years (56.2 ttl pk-yrs)    Types: Cigarettes    Start date: 18    Quit date: 09/22/2022    Years since quitting: 0.7    Passive exposure: Past   Smokeless tobacco: Never   Tobacco comments:    Quit in March of 2024.  Vaping Use   Vaping status: Never Used  Substance Use Topics   Alcohol use: Not Currently    Comment: Occassional,   Drug use: No    Pertinent Clinical Results:  LABS:   No visits with results within 3 Day(s) from this visit.  Latest known visit with  results is:  Hospital Outpatient Visit on 06/14/2023  Component Date Value Ref Range Status   WBC 06/14/2023 5.6  4.0 - 10.5 K/uL Final   RBC 06/14/2023 3.82 (L)  3.87 - 5.11 MIL/uL Final   Hemoglobin 06/14/2023 10.3 (L)  12.0 - 15.0 g/dL Final   HCT 95/62/1308 31.7 (L)  36.0 - 46.0 % Final   MCV 06/14/2023 83.0  80.0 - 100.0 fL Final   MCH 06/14/2023 27.0  26.0 - 34.0 pg Final   MCHC 06/14/2023 32.5  30.0 - 36.0 g/dL Final   RDW 65/78/4696 15.1  11.5 - 15.5 % Final   Platelets 06/14/2023 366  150 - 400 K/uL Final   nRBC 06/14/2023 0.0  0.0 - 0.2 % Final   Performed at [facility], [address] [place], [place] [address]   Sodium 06/14/2023 136  135 - 145 mmol/L Final  Potassium 06/14/2023 4.2  3.5 - 5.1 mmol/L Final   Chloride 06/14/2023 101  98 - 111 mmol/L Final   CO2 06/14/2023 26  22 - 32 mmol/L Final   Glucose, Bld 06/14/2023 83  70 - 99 mg/dL Final   Glucose reference range applies only to samples taken after fasting for at least 8 hours.   BUN 06/14/2023 9  8 - 23 mg/dL Final   Creatinine, Ser 06/14/2023 0.84  0.44 - 1.00 mg/dL Final   Calcium 16/07/930 8.9  8.9 - 10.3 mg/dL Final   GFR, Estimated 06/14/2023 >60  >60 mL/min Final   Comment: (NOTE) Calculated using the CKD-EPI Creatinine Equation (2021)    Anion gap 06/14/2023 9  5 - 15 Final   Performed at [facility], [address] [place], [place] [address]    ECG: Date: 06/14/2023 Time ECG obtained: 1352 PM Rate: 49* bpm Rhythm: sinus bradycardia Axis (leads I and aVF): Normal Intervals: PR 204 ms. QRS 106 ms. QTc 429 ms. ST segment and T wave changes: No evidence of acute ST segment elevation or depression.   Comparison: Similar to previous tracing obtained on 11/01/2022   IMAGING / PROCEDURES: NM PET IMAGE INITIAL (PI) SKULL BASE TO THIGH performed on 05/09/2023 Prior new subpleural left lower lobe nodules are not well visualized. New 7 mm subpleural patchy/nodular opacity in the  left upper lobe, favored to be infectious/inflammatory. Resumption of annual low-dose lung cancer screening is suggested. No findings suspicious for malignancy.  CT CHEST LCS NODULE F/U LOW DOSE WO CONTRAST performed on 04/23/2023 Lung-RADS 3, probably benign findings. There are 2 new subpleural left lower lobe pulmonary nodules measuring 5.2 and 5.5 mm.Short-term follow-up in 6 months is recommended with repeat low-dose chest CT without contrast (please use the following order, "CT CHEST LCS NODULE FOLLOW-UP W/O CM"). Previously identified pulmonary nodules are stable including the 7.8 mm paraspinal right upper lobe pulmonary nodule of concern on the previous study. Aortic atherosclerosis  Emphysema   DG ESOPHAGUS W SINGLE CM (SOL OR THIN BA) performed on 03/08/2023 No evidence of laryngeal penetration or tracheal aspiration. Cricopharyngeal prominence, not obstructive to the passage of the 3 mm tablet. Patulous esophagus with esophageal dysmotility. Large hiatal hernia containing most of the stomach. Gastroesophageal reflux. No evidence of esophageal stricture.   PULMONARY FUNCTION TESTING performed on 03/08/2023    Latest Ref Rng & Units 03/08/2023   10:39 AM  PFT Results  FVC-Pre L 1.89   FVC-Predicted Pre % 62   FVC-Post L 1.92   FVC-Predicted Post % 62   Pre FEV1/FVC % % 68   Post FEV1/FCV % % 72   FEV1-Pre L 1.29   FEV1-Predicted Pre % 56   FEV1-Post L 1.37   DLCO uncorrected ml/min/mmHg 11.63   DLCO UNC% % 58   DLVA Predicted % 61   TLC L 5.33   TLC % Predicted % 102   RV % Predicted % 125     TRANSTHORACIC ECHOCARDIOGRAM performed on 10/19/2022 Left ventricular ejection fraction, by estimation, is 60 to 65%. The left ventricle has normal function. The left ventricle has no regional wall motion abnormalities. Left ventricular diastolic parameters are consistent with Grade I diastolic dysfunction (impaired relaxation).  Right ventricular systolic function is normal. The  right ventricular size is normal. Tricuspid regurgitation signal is inadequate for assessing PA pressure.  The mitral valve is normal in structure. No evidence of mitral valve regurgitation. No evidence of mitral stenosis.  The aortic valve  is normal in structure. Aortic valve regurgitation is not visualized. Aortic valve sclerosis is present, with no evidence of aortic valve stenosis.  The inferior vena cava is normal in size with greater than 50% respiratory variability, suggesting right atrial pressure of 3 mmHg.   LEFT HEART CATHETERIZATION AND CORONARY ANGIOGRAPHY performed on 10/18/2022 Severe one-vessel coronary artery disease with 85% hazy eccentric stenosis in the proximal right coronary artery which is the likely culprit for unstable angina.   The LAD is mildly calcified but no significant stenosis. Normal LV systolic function and mildly elevated left ventricular end-diastolic pressure. Successful drug-eluting stent placement to the proximal right coronary artery. Recommendations: Dual antiplatelet therapy for at least 12 months Aggressive treatment of risk factors Likely discharge home tomorrow if she remains stable.  CT CARDIAC SCORING performed on 10/05/2022 Aortic atherosclerosis Redemonstrated large hiatal hernia as well as paraesophageal hernia containing segment of transverse colon.  Coronary calcium score of 601. This was 92nd percentile for age and sex matched control. CAC >300 in LAD, RCA. CAC-DRS A3/N2. Consider aspirin and statin if no contraindication. Consider cardiology consultation. Continue heart healthy lifestyle and risk factor modification.  Impression and Plan:  Tammy Lamb has been referred for pre-anesthesia review and clearance prior to her undergoing the planned anesthetic and procedural courses. Available labs, pertinent testing, and imaging results were personally reviewed by me in preparation for upcoming operative/procedural course. [facility]  medical record has been updated following extensive record review and patient interview with PAT staff.   This patient has been appropriately cleared by cardiology with an overall ACCEPTABLE risk of experiencing significant perioperative cardiovascular complications. Based on clinical review performed today (06/18/23), barring any significant acute changes in the patient's overall condition, it is anticipated that she will be able to proceed with the planned surgical intervention. Any acute changes in clinical condition may necessitate her procedure being postponed and/or cancelled. Patient will meet with anesthesia team (MD and/or CRNA) on the day of her procedure for preoperative evaluation/assessment. Questions regarding anesthetic course will be fielded at that time.   Pre-surgical instructions were reviewed with the patient during her PAT appointment, and questions were fielded to satisfaction by PAT clinical staff. She has been instructed on which medications that she will need to hold prior to surgery, as well as the ones that have been deemed safe/appropriate to take on the day of her procedure. As part of the general education provided by PAT, patient made aware both verbally and in writing, that she would need to abstain from the use of any illegal substances during her perioperative course.  She was advised that failure to follow the provided instructions could necessitate case cancellation or result in serious perioperative complications up to and including death. Patient encouraged to contact PAT and/or her surgeon's office to discuss any questions or concerns that may arise prior to surgery; verbalized understanding.   Quentin Mulling, MSN, APRN, FNP-C, C[facility]  Perioperative Services Nurse Practitioner Phone: [phone] Fax: [phone] 06/18/23 10:40 AM  NOTE: This note has been prepared using Dragon dictation software. Despite my best ability to proofread,  there is always the potential that unintentional transcriptional errors may still occur from this process.

## 2023-06-19 ENCOUNTER — Inpatient Hospital Stay
Admission: RE | Admit: 2023-06-19 | Discharge: 2023-06-23 | DRG: 326 | Disposition: A | Discharge status: Home Health | Payer: Medicare Other | Attending: Surgery | Admitting: Surgery

## 2023-06-19 ENCOUNTER — Ambulatory Visit: Payer: Medicare Other | Admitting: Urgent Care

## 2023-06-19 ENCOUNTER — Other Ambulatory Visit: Payer: Self-pay

## 2023-06-19 ENCOUNTER — Encounter
Admission: RE | Disposition: A | Discharge status: Home Health | Payer: Self-pay | Source: Home / Self Care | Attending: Surgery

## 2023-06-19 ENCOUNTER — Encounter: Payer: Self-pay | Admitting: Surgery

## 2023-06-19 ENCOUNTER — Ambulatory Visit: Payer: Medicare Other

## 2023-06-19 ENCOUNTER — Ambulatory Visit: Payer: Self-pay | Admitting: Urgent Care

## 2023-06-19 DIAGNOSIS — I6523 Occlusion and stenosis of bilateral carotid arteries: Secondary | ICD-10-CM | POA: Diagnosis present

## 2023-06-19 DIAGNOSIS — R7989 Other specified abnormal findings of blood chemistry: Secondary | ICD-10-CM | POA: Insufficient documentation

## 2023-06-19 DIAGNOSIS — G8929 Other chronic pain: Secondary | ICD-10-CM | POA: Diagnosis present

## 2023-06-19 DIAGNOSIS — E440 Moderate protein-calorie malnutrition: Secondary | ICD-10-CM | POA: Diagnosis present

## 2023-06-19 DIAGNOSIS — Z79899 Other long term (current) drug therapy: Secondary | ICD-10-CM

## 2023-06-19 DIAGNOSIS — Z8719 Personal history of other diseases of the digestive system: Principal | ICD-10-CM

## 2023-06-19 DIAGNOSIS — K219 Gastro-esophageal reflux disease without esophagitis: Secondary | ICD-10-CM | POA: Diagnosis not present

## 2023-06-19 DIAGNOSIS — Z87891 Personal history of nicotine dependence: Secondary | ICD-10-CM

## 2023-06-19 DIAGNOSIS — M5416 Radiculopathy, lumbar region: Secondary | ICD-10-CM | POA: Diagnosis present

## 2023-06-19 DIAGNOSIS — Z8616 Personal history of COVID-19: Secondary | ICD-10-CM

## 2023-06-19 DIAGNOSIS — G20A1 Parkinson's disease without dyskinesia, without mention of fluctuations: Secondary | ICD-10-CM | POA: Diagnosis present

## 2023-06-19 DIAGNOSIS — Z7982 Long term (current) use of aspirin: Secondary | ICD-10-CM

## 2023-06-19 DIAGNOSIS — Z9049 Acquired absence of other specified parts of digestive tract: Secondary | ICD-10-CM

## 2023-06-19 DIAGNOSIS — Z833 Family history of diabetes mellitus: Secondary | ICD-10-CM

## 2023-06-19 DIAGNOSIS — E782 Mixed hyperlipidemia: Secondary | ICD-10-CM | POA: Diagnosis present

## 2023-06-19 DIAGNOSIS — Z8673 Personal history of transient ischemic attack (TIA), and cerebral infarction without residual deficits: Secondary | ICD-10-CM

## 2023-06-19 DIAGNOSIS — G894 Chronic pain syndrome: Secondary | ICD-10-CM | POA: Diagnosis present

## 2023-06-19 DIAGNOSIS — M5030 Other cervical disc degeneration, unspecified cervical region: Secondary | ICD-10-CM | POA: Diagnosis present

## 2023-06-19 DIAGNOSIS — E669 Obesity, unspecified: Secondary | ICD-10-CM | POA: Diagnosis present

## 2023-06-19 DIAGNOSIS — J449 Chronic obstructive pulmonary disease, unspecified: Secondary | ICD-10-CM | POA: Insufficient documentation

## 2023-06-19 DIAGNOSIS — I110 Hypertensive heart disease with heart failure: Secondary | ICD-10-CM | POA: Diagnosis present

## 2023-06-19 DIAGNOSIS — K449 Diaphragmatic hernia without obstruction or gangrene: Secondary | ICD-10-CM | POA: Diagnosis not present

## 2023-06-19 DIAGNOSIS — Z825 Family history of asthma and other chronic lower respiratory diseases: Secondary | ICD-10-CM

## 2023-06-19 DIAGNOSIS — I25110 Atherosclerotic heart disease of native coronary artery with unstable angina pectoris: Secondary | ICD-10-CM | POA: Diagnosis present

## 2023-06-19 DIAGNOSIS — D649 Anemia, unspecified: Secondary | ICD-10-CM | POA: Diagnosis present

## 2023-06-19 DIAGNOSIS — I70219 Atherosclerosis of native arteries of extremities with intermittent claudication, unspecified extremity: Secondary | ICD-10-CM | POA: Diagnosis present

## 2023-06-19 DIAGNOSIS — I5033 Acute on chronic diastolic (congestive) heart failure: Secondary | ICD-10-CM | POA: Diagnosis present

## 2023-06-19 DIAGNOSIS — E46 Unspecified protein-calorie malnutrition: Secondary | ICD-10-CM | POA: Insufficient documentation

## 2023-06-19 DIAGNOSIS — Z7902 Long term (current) use of antithrombotics/antiplatelets: Secondary | ICD-10-CM

## 2023-06-19 DIAGNOSIS — Z881 Allergy status to other antibiotic agents status: Secondary | ICD-10-CM

## 2023-06-19 DIAGNOSIS — Z90710 Acquired absence of both cervix and uterus: Secondary | ICD-10-CM

## 2023-06-19 DIAGNOSIS — F32A Depression, unspecified: Secondary | ICD-10-CM | POA: Insufficient documentation

## 2023-06-19 DIAGNOSIS — J9601 Acute respiratory failure with hypoxia: Secondary | ICD-10-CM | POA: Diagnosis present

## 2023-06-19 DIAGNOSIS — E1151 Type 2 diabetes mellitus with diabetic peripheral angiopathy without gangrene: Secondary | ICD-10-CM | POA: Diagnosis present

## 2023-06-19 DIAGNOSIS — I272 Pulmonary hypertension, unspecified: Secondary | ICD-10-CM | POA: Diagnosis present

## 2023-06-19 DIAGNOSIS — Z886 Allergy status to analgesic agent status: Secondary | ICD-10-CM

## 2023-06-19 DIAGNOSIS — Z955 Presence of coronary angioplasty implant and graft: Secondary | ICD-10-CM

## 2023-06-19 DIAGNOSIS — M1711 Unilateral primary osteoarthritis, right knee: Secondary | ICD-10-CM | POA: Diagnosis present

## 2023-06-19 DIAGNOSIS — F419 Anxiety disorder, unspecified: Secondary | ICD-10-CM | POA: Diagnosis present

## 2023-06-19 DIAGNOSIS — G20C Parkinsonism, unspecified: Secondary | ICD-10-CM | POA: Diagnosis present

## 2023-06-19 DIAGNOSIS — Z6833 Body mass index (BMI) 33.0-33.9, adult: Secondary | ICD-10-CM

## 2023-06-19 DIAGNOSIS — E871 Hypo-osmolality and hyponatremia: Secondary | ICD-10-CM | POA: Diagnosis present

## 2023-06-19 DIAGNOSIS — I2489 Other forms of acute ischemic heart disease: Secondary | ICD-10-CM | POA: Diagnosis present

## 2023-06-19 DIAGNOSIS — Z8249 Family history of ischemic heart disease and other diseases of the circulatory system: Secondary | ICD-10-CM

## 2023-06-19 DIAGNOSIS — K5190 Ulcerative colitis, unspecified, without complications: Secondary | ICD-10-CM | POA: Diagnosis present

## 2023-06-19 DIAGNOSIS — R29818 Other symptoms and signs involving the nervous system: Secondary | ICD-10-CM | POA: Diagnosis present

## 2023-06-19 DIAGNOSIS — J441 Chronic obstructive pulmonary disease with (acute) exacerbation: Secondary | ICD-10-CM | POA: Diagnosis present

## 2023-06-19 DIAGNOSIS — I10 Essential (primary) hypertension: Secondary | ICD-10-CM | POA: Diagnosis present

## 2023-06-19 DIAGNOSIS — N3010 Interstitial cystitis (chronic) without hematuria: Secondary | ICD-10-CM | POA: Diagnosis present

## 2023-06-19 HISTORY — DX: Diaphragmatic hernia without obstruction or gangrene: K44.9

## 2023-06-19 HISTORY — DX: Atherosclerosis of aorta: I70.0

## 2023-06-19 HISTORY — PX: XI ROBOTIC ASSISTED PARAESOPHAGEAL HERNIA REPAIR: SHX6871

## 2023-06-19 HISTORY — DX: Type 2 diabetes mellitus without complications: E11.9

## 2023-06-19 HISTORY — PX: INSERTION OF MESH: SHX5868

## 2023-06-19 HISTORY — DX: Long term (current) use of aspirin: Z79.82

## 2023-06-19 HISTORY — DX: Long term (current) use of anticoagulants: Z79.01

## 2023-06-19 HISTORY — DX: Other cervical disc degeneration, unspecified cervical region: M50.30

## 2023-06-19 HISTORY — DX: Other cerebrovascular disease: I67.89

## 2023-06-19 HISTORY — DX: Unspecified thoracic, thoracolumbar and lumbosacral intervertebral disc disorder: M51.9

## 2023-06-19 LAB — GLUCOSE, CAPILLARY: Glucose-Capillary: 172 mg/dL — ABNORMAL HIGH (ref 70–99)

## 2023-06-19 SURGERY — REPAIR, HERNIA, PARAESOPHAGEAL, ROBOT-ASSISTED
Anesthesia: General | Site: Thoracic

## 2023-06-19 MED ORDER — ALBUMIN HUMAN 5 % IV SOLN
INTRAVENOUS | Status: AC
  Filled 2023-06-19: qty 250

## 2023-06-19 MED ORDER — OXYBUTYNIN CHLORIDE ER 10 MG PO TB24
10.0000 mg | ORAL_TABLET | Freq: Every day | ORAL | Status: DC
  Administered 2023-06-19 – 2023-06-22 (×4): 10 mg via ORAL
  Filled 2023-06-19 (×4): qty 1

## 2023-06-19 MED ORDER — FENTANYL CITRATE (PF) 100 MCG/2ML IJ SOLN
INTRAMUSCULAR | Status: AC
  Filled 2023-06-19: qty 2

## 2023-06-19 MED ORDER — FENTANYL CITRATE (PF) 100 MCG/2ML IJ SOLN
25.0000 ug | INTRAMUSCULAR | Status: DC | PRN
  Administered 2023-06-19 (×5): 25 ug via INTRAVENOUS

## 2023-06-19 MED ORDER — PROPOFOL 10 MG/ML IV BOLUS
INTRAVENOUS | Status: AC
  Filled 2023-06-19: qty 20

## 2023-06-19 MED ORDER — ONDANSETRON HCL 4 MG/2ML IJ SOLN
4.0000 mg | Freq: Four times a day (QID) | INTRAMUSCULAR | Status: DC | PRN
  Administered 2023-06-19: 4 mg via INTRAVENOUS

## 2023-06-19 MED ORDER — ORAL CARE MOUTH RINSE: 15.0000 mL | Freq: Once | OROMUCOSAL | Status: AC

## 2023-06-19 MED ORDER — KETOROLAC TROMETHAMINE 15 MG/ML IJ SOLN
15.0000 mg | Freq: Four times a day (QID) | INTRAMUSCULAR | Status: DC
  Administered 2023-06-19 – 2023-06-22 (×12): 15 mg via INTRAVENOUS
  Filled 2023-06-19 (×10): qty 1

## 2023-06-19 MED ORDER — DROPERIDOL 2.5 MG/ML IJ SOLN: 0.6250 mg | Freq: Once | INTRAMUSCULAR | Status: DC | PRN

## 2023-06-19 MED ORDER — PHENYLEPHRINE HCL-NACL 20-0.9 MG/250ML-% IV SOLN
INTRAVENOUS | Status: AC
  Filled 2023-06-19: qty 250

## 2023-06-19 MED ORDER — ASPIRIN 81 MG PO TBEC
81.0000 mg | DELAYED_RELEASE_TABLET | Freq: Every day | ORAL | Status: DC
  Administered 2023-06-20 – 2023-06-23 (×4): 81 mg via ORAL
  Filled 2023-06-19 (×4): qty 1

## 2023-06-19 MED ORDER — KETOROLAC TROMETHAMINE 15 MG/ML IJ SOLN
INTRAMUSCULAR | Status: AC
  Filled 2023-06-19: qty 1

## 2023-06-19 MED ORDER — METOPROLOL TARTRATE 25 MG PO TABS
ORAL_TABLET | ORAL | Status: AC
  Filled 2023-06-19: qty 1

## 2023-06-19 MED ORDER — GABAPENTIN 300 MG PO CAPS
ORAL_CAPSULE | ORAL | Status: AC
Start: 2023-06-19 — End: ?
  Filled 2023-06-19: qty 3

## 2023-06-19 MED ORDER — TIZANIDINE HCL 4 MG PO TABS
4.0000 mg | ORAL_TABLET | Freq: Four times a day (QID) | ORAL | Status: DC | PRN
  Administered 2023-06-19: 4 mg via ORAL
  Filled 2023-06-19 (×2): qty 1

## 2023-06-19 MED ORDER — ROCURONIUM BROMIDE 100 MG/10ML IV SOLN
INTRAVENOUS | Status: DC | PRN
  Administered 2023-06-19: 30 mg via INTRAVENOUS
  Administered 2023-06-19 (×3): 20 mg via INTRAVENOUS
  Administered 2023-06-19: 50 mg via INTRAVENOUS

## 2023-06-19 MED ORDER — LIDOCAINE HCL (PF) 2 % IJ SOLN
INTRAMUSCULAR | Status: AC
  Filled 2023-06-19: qty 5

## 2023-06-19 MED ORDER — ONDANSETRON HCL 4 MG/2ML IJ SOLN
INTRAMUSCULAR | Status: DC | PRN
  Administered 2023-06-19: 4 mg via INTRAVENOUS

## 2023-06-19 MED ORDER — VITAMIN B-12 1000 MCG PO TABS
1000.0000 ug | ORAL_TABLET | Freq: Every day | ORAL | Status: DC
  Administered 2023-06-20 – 2023-06-23 (×4): 1000 ug via ORAL
  Filled 2023-06-19 (×5): qty 1

## 2023-06-19 MED ORDER — CHLORHEXIDINE GLUCONATE 0.12 % MT SOLN
OROMUCOSAL | Status: AC
  Filled 2023-06-19: qty 15

## 2023-06-19 MED ORDER — BUPIVACAINE LIPOSOME 1.3 % IJ SUSP
INTRAMUSCULAR | Status: DC | PRN
  Administered 2023-06-19: 20 mL

## 2023-06-19 MED ORDER — OXYCODONE HCL 5 MG PO TABS
ORAL_TABLET | ORAL | Status: AC
  Filled 2023-06-19: qty 1

## 2023-06-19 MED ORDER — CITALOPRAM HYDROBROMIDE 20 MG PO TABS
20.0000 mg | ORAL_TABLET | Freq: Every day | ORAL | Status: DC
  Administered 2023-06-20 – 2023-06-23 (×4): 20 mg via ORAL
  Filled 2023-06-19 (×5): qty 1

## 2023-06-19 MED ORDER — PROPOFOL 10 MG/ML IV BOLUS
INTRAVENOUS | Status: DC | PRN
  Administered 2023-06-19: 150 mg via INTRAVENOUS

## 2023-06-19 MED ORDER — METOPROLOL TARTRATE 25 MG PO TABS
25.0000 mg | ORAL_TABLET | Freq: Two times a day (BID) | ORAL | Status: DC
  Administered 2023-06-19 – 2023-06-23 (×8): 25 mg via ORAL
  Filled 2023-06-19 (×6): qty 1

## 2023-06-19 MED ORDER — PHENYLEPHRINE HCL-NACL 20-0.9 MG/250ML-% IV SOLN
INTRAVENOUS | Status: DC | PRN
  Administered 2023-06-19: 50 ug/min via INTRAVENOUS

## 2023-06-19 MED ORDER — CHLORHEXIDINE GLUCONATE CLOTH 2 % EX PADS
6.0000 | MEDICATED_PAD | Freq: Once | CUTANEOUS | Status: DC
Start: 2023-06-19 — End: 2023-06-19

## 2023-06-19 MED ORDER — ROCURONIUM BROMIDE 10 MG/ML (PF) SYRINGE
PREFILLED_SYRINGE | INTRAVENOUS | Status: AC
  Filled 2023-06-19: qty 40

## 2023-06-19 MED ORDER — CHLORHEXIDINE GLUCONATE CLOTH 2 % EX PADS
6.0000 | MEDICATED_PAD | Freq: Once | CUTANEOUS | Status: DC
Start: 2023-06-19 — End: 2023-06-19
  Administered 2023-06-19: 6 via TOPICAL

## 2023-06-19 MED ORDER — BUPIVACAINE-EPINEPHRINE (PF) 0.25% -1:200000 IJ SOLN
INTRAMUSCULAR | Status: AC
  Filled 2023-06-19: qty 30

## 2023-06-19 MED ORDER — BUPIVACAINE-EPINEPHRINE 0.25% -1:200000 IJ SOLN
INTRAMUSCULAR | Status: DC | PRN
  Administered 2023-06-19: 30 mL

## 2023-06-19 MED ORDER — MIDAZOLAM HCL 2 MG/2ML IJ SOLN
INTRAMUSCULAR | Status: AC
Start: 2023-06-19 — End: ?
  Filled 2023-06-19: qty 2

## 2023-06-19 MED ORDER — PENTOSAN POLYSULFATE SODIUM 100 MG PO CAPS
100.0000 mg | ORAL_CAPSULE | Freq: Two times a day (BID) | ORAL | Status: DC
  Administered 2023-06-19 – 2023-06-23 (×8): 100 mg via ORAL
  Filled 2023-06-19 (×10): qty 1

## 2023-06-19 MED ORDER — EPHEDRINE 5 MG/ML INJ
INTRAVENOUS | Status: AC
  Filled 2023-06-19: qty 5

## 2023-06-19 MED ORDER — SUGAMMADEX SODIUM 200 MG/2ML IV SOLN
INTRAVENOUS | Status: DC | PRN
  Administered 2023-06-19: 200 mg via INTRAVENOUS

## 2023-06-19 MED ORDER — MOMETASONE FURO-FORMOTEROL FUM 100-5 MCG/ACT IN AERO
2.0000 | INHALATION_SPRAY | Freq: Two times a day (BID) | RESPIRATORY_TRACT | Status: DC
  Administered 2023-06-20 – 2023-06-21 (×3): 2 via RESPIRATORY_TRACT
  Filled 2023-06-19: qty 8.8

## 2023-06-19 MED ORDER — CHLORHEXIDINE GLUCONATE 0.12 % MT SOLN
15.0000 mL | Freq: Once | OROMUCOSAL | Status: AC
  Administered 2023-06-19: 15 mL via OROMUCOSAL

## 2023-06-19 MED ORDER — ONDANSETRON HCL 4 MG/2ML IJ SOLN
INTRAMUSCULAR | Status: AC
  Filled 2023-06-19: qty 2

## 2023-06-19 MED ORDER — LIDOCAINE HCL (CARDIAC) PF 100 MG/5ML IV SOSY
PREFILLED_SYRINGE | INTRAVENOUS | Status: DC | PRN
  Administered 2023-06-19: 60 mg via INTRAVENOUS

## 2023-06-19 MED ORDER — VISTASEAL 10 ML SINGLE DOSE KIT
PACK | CUTANEOUS | Status: AC
  Filled 2023-06-19: qty 10

## 2023-06-19 MED ORDER — GABAPENTIN 300 MG PO CAPS
900.0000 mg | ORAL_CAPSULE | Freq: Two times a day (BID) | ORAL | Status: DC
Start: 2023-06-19 — End: 2023-06-21
  Administered 2023-06-19 – 2023-06-21 (×4): 900 mg via ORAL
  Filled 2023-06-19 (×2): qty 3

## 2023-06-19 MED ORDER — LACTATED RINGERS IV SOLN: INTRAVENOUS | Status: DC

## 2023-06-19 MED ORDER — GABAPENTIN 300 MG PO CAPS
ORAL_CAPSULE | ORAL | Status: AC
  Filled 2023-06-19: qty 1

## 2023-06-19 MED ORDER — PHENYLEPHRINE 80 MCG/ML (10ML) SYRINGE FOR IV PUSH (FOR BLOOD PRESSURE SUPPORT)
PREFILLED_SYRINGE | INTRAVENOUS | Status: AC
  Filled 2023-06-19: qty 10

## 2023-06-19 MED ORDER — ACETAMINOPHEN 500 MG PO TABS
1000.0000 mg | ORAL_TABLET | ORAL | Status: AC
Start: 2023-06-19 — End: 2023-06-19
  Administered 2023-06-19: 1000 mg via ORAL

## 2023-06-19 MED ORDER — CELECOXIB 200 MG PO CAPS
200.0000 mg | ORAL_CAPSULE | ORAL | Status: AC
Start: 2023-06-19 — End: 2023-06-19
  Administered 2023-06-19: 200 mg via ORAL

## 2023-06-19 MED ORDER — HYDROXYZINE HCL 25 MG PO TABS
25.0000 mg | ORAL_TABLET | Freq: Every day | ORAL | Status: DC
  Administered 2023-06-19 – 2023-06-20 (×2): 25 mg via ORAL
  Filled 2023-06-19 (×3): qty 1

## 2023-06-19 MED ORDER — ACETAMINOPHEN 500 MG PO TABS
ORAL_TABLET | ORAL | Status: AC
  Filled 2023-06-19: qty 2

## 2023-06-19 MED ORDER — BUDESON-GLYCOPYRROL-FORMOTEROL 160-9-4.8 MCG/ACT IN AERO: 2.0000 | INHALATION_SPRAY | Freq: Four times a day (QID) | RESPIRATORY_TRACT | Status: DC | PRN

## 2023-06-19 MED ORDER — NITROGLYCERIN 0.4 MG SL SUBL: 0.4000 mg | SUBLINGUAL_TABLET | SUBLINGUAL | Status: DC | PRN

## 2023-06-19 MED ORDER — PHENYLEPHRINE 80 MCG/ML (10ML) SYRINGE FOR IV PUSH (FOR BLOOD PRESSURE SUPPORT)
PREFILLED_SYRINGE | INTRAVENOUS | Status: DC | PRN
  Administered 2023-06-19 (×2): 80 ug via INTRAVENOUS
  Administered 2023-06-19: 160 ug via INTRAVENOUS

## 2023-06-19 MED ORDER — MORPHINE SULFATE (PF) 4 MG/ML IV SOLN: 2.0000 mg | INTRAVENOUS | Status: DC | PRN

## 2023-06-19 MED ORDER — ALBUMIN HUMAN 5 % IV SOLN: INTRAVENOUS | Status: DC | PRN

## 2023-06-19 MED ORDER — KETOROLAC TROMETHAMINE 15 MG/ML IJ SOLN
INTRAMUSCULAR | Status: AC
Start: 2023-06-19 — End: ?
  Filled 2023-06-19: qty 1

## 2023-06-19 MED ORDER — CEFAZOLIN SODIUM-DEXTROSE 2-3 GM-%(50ML) IV SOLR
INTRAVENOUS | Status: DC | PRN
  Administered 2023-06-19: 2 g via INTRAVENOUS

## 2023-06-19 MED ORDER — CELECOXIB 200 MG PO CAPS
ORAL_CAPSULE | ORAL | Status: AC
  Filled 2023-06-19: qty 1

## 2023-06-19 MED ORDER — FENTANYL CITRATE (PF) 100 MCG/2ML IJ SOLN
INTRAMUSCULAR | Status: DC | PRN
  Administered 2023-06-19 (×4): 50 ug via INTRAVENOUS

## 2023-06-19 MED ORDER — OXYBUTYNIN CHLORIDE ER 5 MG PO TB24
ORAL_TABLET | ORAL | Status: AC
  Filled 2023-06-19: qty 2

## 2023-06-19 MED ORDER — GABAPENTIN 300 MG PO CAPS
300.0000 mg | ORAL_CAPSULE | ORAL | Status: DC
Start: 2023-06-19 — End: 2023-06-19

## 2023-06-19 MED ORDER — SODIUM CHLORIDE 0.9 % IV SOLN
12.5000 mg | Freq: Four times a day (QID) | INTRAVENOUS | Status: DC | PRN
  Filled 2023-06-19: qty 0.5

## 2023-06-19 MED ORDER — DEXAMETHASONE SODIUM PHOSPHATE 10 MG/ML IJ SOLN
INTRAMUSCULAR | Status: AC
  Filled 2023-06-19: qty 1

## 2023-06-19 MED ORDER — SULFASALAZINE 500 MG PO TBEC
500.0000 mg | DELAYED_RELEASE_TABLET | Freq: Two times a day (BID) | ORAL | Status: DC
  Administered 2023-06-19 – 2023-06-23 (×8): 500 mg via ORAL
  Filled 2023-06-19 (×10): qty 1

## 2023-06-19 MED ORDER — BUPIVACAINE LIPOSOME 1.3 % IJ SUSP
INTRAMUSCULAR | Status: AC
  Filled 2023-06-19: qty 20

## 2023-06-19 MED ORDER — UMECLIDINIUM BROMIDE 62.5 MCG/ACT IN AEPB
1.0000 | INHALATION_SPRAY | Freq: Every day | RESPIRATORY_TRACT | Status: DC
  Administered 2023-06-20 – 2023-06-21 (×2): 1 via RESPIRATORY_TRACT
  Filled 2023-06-19: qty 7

## 2023-06-19 MED ORDER — ALBUTEROL SULFATE (2.5 MG/3ML) 0.083% IN NEBU: 2.5000 mg | INHALATION_SOLUTION | Freq: Four times a day (QID) | RESPIRATORY_TRACT | Status: DC | PRN

## 2023-06-19 MED ORDER — DULOXETINE HCL 30 MG PO CPEP
60.0000 mg | ORAL_CAPSULE | Freq: Every day | ORAL | Status: DC
  Administered 2023-06-20 – 2023-06-23 (×4): 60 mg via ORAL
  Filled 2023-06-19: qty 1
  Filled 2023-06-19 (×3): qty 2
  Filled 2023-06-19: qty 1

## 2023-06-19 MED ORDER — CEFAZOLIN SODIUM-DEXTROSE 2-4 GM/100ML-% IV SOLN
2.0000 g | INTRAVENOUS | Status: AC
  Administered 2023-06-19: 2 g via INTRAVENOUS

## 2023-06-19 MED ORDER — OXYCODONE HCL 5 MG PO TABS
5.0000 mg | ORAL_TABLET | ORAL | Status: DC | PRN
  Administered 2023-06-19 – 2023-06-21 (×6): 5 mg via ORAL
  Filled 2023-06-19: qty 1

## 2023-06-19 MED ORDER — VISTASEAL 10 ML SINGLE DOSE KIT
PACK | CUTANEOUS | Status: DC | PRN
  Administered 2023-06-19: 10 mL via TOPICAL

## 2023-06-19 MED ORDER — DEXAMETHASONE SODIUM PHOSPHATE 10 MG/ML IJ SOLN
INTRAMUSCULAR | Status: DC | PRN
  Administered 2023-06-19: 10 mg via INTRAVENOUS

## 2023-06-19 MED ORDER — CEFAZOLIN SODIUM-DEXTROSE 2-4 GM/100ML-% IV SOLN
INTRAVENOUS | Status: AC
  Filled 2023-06-19: qty 100

## 2023-06-19 MED ORDER — MIDAZOLAM HCL 2 MG/2ML IJ SOLN
INTRAMUSCULAR | Status: DC | PRN
  Administered 2023-06-19: 1 mg via INTRAVENOUS

## 2023-06-19 MED ORDER — EPHEDRINE SULFATE-NACL 50-0.9 MG/10ML-% IV SOSY
PREFILLED_SYRINGE | INTRAVENOUS | Status: DC | PRN
  Administered 2023-06-19: 5 mg via INTRAVENOUS
  Administered 2023-06-19: 10 mg via INTRAVENOUS
  Administered 2023-06-19 (×2): 5 mg via INTRAVENOUS

## 2023-06-19 SURGICAL SUPPLY — 57 items
APPLICATOR VISTASEAL 35 (MISCELLANEOUS) IMPLANT
CANNULA REDUCER 12-8 DVNC XI (CANNULA) ×2 IMPLANT
CLIP LIGATING HEM O LOK PURPLE (MISCELLANEOUS) IMPLANT
DERMABOND ADVANCED .7 DNX12 (GAUZE/BANDAGES/DRESSINGS) ×2 IMPLANT
DRAIN CHANNEL JP 19F 1/4 (MISCELLANEOUS) IMPLANT
DRAIN PENROSE .5X12 LATEX STL (DRAIN) IMPLANT
DRAPE ARM DVNC X/XI (DISPOSABLE) ×8 IMPLANT
DRAPE COLUMN DVNC XI (DISPOSABLE) ×2 IMPLANT
ELECT REM PT RETURN 9FT ADLT (ELECTROSURGICAL) ×2 IMPLANT
ELECTRODE REM PT RTRN 9FT ADLT (ELECTROSURGICAL) ×2 IMPLANT
EVACUATOR SILICONE 100CC (DRAIN) IMPLANT
FORCEPS BPLR R/ABLATION 8 DVNC (INSTRUMENTS) ×2 IMPLANT
GLOVE BIO SURGEON STRL SZ7 (GLOVE) ×6 IMPLANT
GOWN STRL REUS W/ TWL LRG LVL3 (GOWN DISPOSABLE) ×8 IMPLANT
GRASPER LAPSCPC 5X45 DSP (INSTRUMENTS) ×2 IMPLANT
GRASPER TIP-UP FEN DVNC XI (INSTRUMENTS) ×2 IMPLANT
IRRIGATION STRYKERFLOW (MISCELLANEOUS) IMPLANT
IRRIGATOR STRYKERFLOW (MISCELLANEOUS) ×2 IMPLANT
IV NS 1000ML BAXH (IV SOLUTION) IMPLANT
KIT IMAGING PINPOINTPAQ (MISCELLANEOUS) ×2 IMPLANT
KIT PINK PAD W/HEAD ARE REST (MISCELLANEOUS) ×2 IMPLANT
KIT PINK PAD W/HEAD ARM REST (MISCELLANEOUS) ×2 IMPLANT
LABEL OR SOLS (LABEL) ×2 IMPLANT
MANIFOLD NEPTUNE II (INSTRUMENTS) ×2 IMPLANT
MESH BIO-A 7X10 SYN MAT (Mesh General) IMPLANT
NDL DRIVE SUT CUT DVNC (INSTRUMENTS) ×2 IMPLANT
NDL HYPO 22X1.5 SAFETY MO (MISCELLANEOUS) ×2 IMPLANT
NEEDLE DRIVE SUT CUT DVNC (INSTRUMENTS) ×2 IMPLANT
NEEDLE HYPO 22X1.5 SAFETY MO (MISCELLANEOUS) ×2 IMPLANT
OBTURATOR OPTICAL STND 8 DVNC (TROCAR) ×2 IMPLANT
OBTURATOR OPTICALSTD 8 DVNC (TROCAR) ×2 IMPLANT
PACK LAP CHOLECYSTECTOMY (MISCELLANEOUS) ×2 IMPLANT
SEAL UNIV 5-12 XI (MISCELLANEOUS) ×8 IMPLANT
SEALER VESSEL EXT DVNC XI (MISCELLANEOUS) ×2 IMPLANT
SOL ELECTROSURG ANTI STICK (MISCELLANEOUS) ×2 IMPLANT
SOLUTION ELECTROSURG ANTI STCK (MISCELLANEOUS) ×2 IMPLANT
SPIKE FLUID TRANSFER (MISCELLANEOUS) ×2 IMPLANT
SPONGE T-LAP 18X18 ~~LOC~~+RFID (SPONGE) ×2 IMPLANT
SUT ETHILON 3-0 FS-10 30 BLK (SUTURE) ×2 IMPLANT
SUT MNCRL 4-0 27 PS-2 XMFL (SUTURE) ×2 IMPLANT
SUT MNCRL 4-0 27XMFL (SUTURE) ×2
SUT SILK 2 0 SH (SUTURE) ×4 IMPLANT
SUT STRATA 2-0 23CM CT-2 (SUTURE) ×2 IMPLANT
SUT VIC AB 3-0 SH 27X BRD (SUTURE) IMPLANT
SUT VICRYL 0 UR6 27IN ABS (SUTURE) ×4 IMPLANT
SUTURE EHLN 3-0 FS-10 30 BLK (SUTURE) IMPLANT
SUTURE MNCRL 4-0 27XMF (SUTURE) ×2 IMPLANT
SYR 30ML LL (SYRINGE) ×2 IMPLANT
SYR TOOMEY IRRIG 70ML (MISCELLANEOUS) ×2 IMPLANT
SYRINGE TOOMEY IRRIG 70ML (MISCELLANEOUS) ×2 IMPLANT
SYS BAG RETRIEVAL 10MM (BASKET) ×2 IMPLANT
SYSTEM BAG RETRIEVAL 10MM (BASKET) IMPLANT
TRAP FLUID SMOKE EVACUATOR (MISCELLANEOUS) ×2 IMPLANT
TRAY FOLEY SLVR 16FR LF STAT (SET/KITS/TRAYS/PACK) ×2 IMPLANT
TROCAR Z-THREAD FIOS 5X100MM (TROCAR) ×2 IMPLANT
TUBING EVAC SMOKE HEATED PNEUM (TUBING) ×2 IMPLANT
WATER STERILE IRR 500ML POUR (IV SOLUTION) ×2 IMPLANT

## 2023-06-19 NOTE — Transfer of Care (Signed)
Immediate Anesthesia Transfer of Care Note  Patient: Tammy Lamb  Procedure(s) Performed: XI ROBOTIC ASSISTED PARAESOPHAGEAL HERNIA REPAIR, RNFA to assist INSERTION OF MESH (Thoracic)  Patient Location: PACU  Anesthesia Type:General  Level of Consciousness: awake, drowsy, and patient cooperative  Airway & Oxygen Therapy: Patient Spontanous Breathing and Patient connected to face mask oxygen  Post-op Assessment: Report given to RN, Post -op Vital signs reviewed and stable, and Patient moving all extremities X 4  Post vital signs: Reviewed and stable  Last Vitals:  Vitals Value Taken Time  BP 148/82 06/19/23 1210  Temp    Pulse 65 06/19/23 1213  Resp 17 06/19/23 1213  SpO2 98 % 06/19/23 1213  Vitals shown include unfiled device data.  Last Pain:  Vitals:   06/19/23 0643  TempSrc: Temporal  PainSc: 7          Complications: No notable events documented.

## 2023-06-19 NOTE — Anesthesia Preprocedure Evaluation (Signed)
Anesthesia Evaluation  Patient identified by MRN, date of birth, ID band Patient awake    Reviewed: Allergy & Precautions, NPO status , Patient's Chart, lab work & pertinent test results  History of Anesthesia Complications Negative for: history of anesthetic complications  Airway Mallampati: III  TM Distance: <3 FB Neck ROM: full    Dental  (+) Chipped, Poor Dentition, Missing, Dental Advidsory Given   Pulmonary shortness of breath and with exertion, COPD, neg recent URI, former smoker   Pulmonary exam normal        Cardiovascular Exercise Tolerance: Good hypertension, pulmonary hypertension(-) angina + CAD, + Cardiac Stents and + Peripheral Vascular Disease  (-) Past MI Normal cardiovascular exam(-) dysrhythmias (-) Valvular Problems/Murmurs     Neuro/Psych  Headaches, neg Seizures PSYCHIATRIC DISORDERS Anxiety Depression    TIA Neuromuscular disease CVA, Residual Symptoms    GI/Hepatic Neg liver ROS, hiatal hernia, PUD,GERD  Controlled,,  Endo/Other  negative endocrine ROSdiabetes, Type 2    Renal/GU Renal disease  negative genitourinary   Musculoskeletal   Abdominal   Peds  Hematology negative hematology ROS (+)   Anesthesia Other Findings Past Medical History: No date: Anemia     Comment:  h/o No date: Anxiety No date: Arthritis No date: Bilateral carotid artery stenosis 10/18/2022: CAD (coronary artery disease), native coronary artery     Comment:  DES to RCA ONYX FRONTIER 3.5X15 No date: Chronic lower back pain No date: Colitis, ulcerative (HCC) No date: COPD (chronic obstructive pulmonary disease) (HCC) No date: Depression No date: Diabetes mellitus without complication (HCC) No date: GERD (gastroesophageal reflux disease) No date: H/O bladder infections 02/2021: History of 2019 novel coronavirus disease (COVID-19) No date: History of hiatal hernia No date: HLD (hyperlipidemia) No date:  Hypertension No date: Interstitial cystitis No date: Low sodium levels No date: Motion sickness     Comment:  fair rides No date: Parkinson's disease (HCC)     Comment:  suspected per dr Sherryll Burger @ [facility] neuro No date: Pre-diabetes No date: PVD (peripheral vascular disease) (HCC) No date: Sinus headache 2015: Stroke (HCC)     Comment:  mild stroke d/t coreg dose being too high and pt. had               severe hypotension. 2015: TIA (transient ischemic attack)     Comment:  no deficits.  per pt due to low BP. No date: Urinary frequency No date: Wears dentures     Comment:  partial upper and lower  Past Surgical History: No date: ABDOMINAL HYSTERECTOMY     Comment:  BSO 03/22/2016: ANKLE ARTHROSCOPY; Left     Comment:  Procedure: ANKLE ARTHROSCOPY DEBRODEMENT EXTENSIVE LEFT               FLEXAR TENDON REPAIR;  Surgeon: Gwyneth Revels, DPM;                Location: [facility];  Service: Podiatry;                Laterality: Left;  WITH POPLITEAL 04/09/2019: ANTERIOR CERVICAL DECOMP/DISCECTOMY FUSION; N/A     Comment:  Procedure: ANTERIOR CERVICAL DECOMPRESSION/DISCECTOMY               FUSION 1 LEVEL C3-4;  Surgeon: Venetia Night, MD;                Location: [facility] ORS;  Service: Neurosurgery;  Laterality:  N/A; No date: BLADDER SUSPENSION     Comment:  tac No date: CHOLECYSTECTOMY No date: COLONOSCOPY 10/18/2022: CORONARY STENT INTERVENTION; N/A     Comment:  Procedure: CORONARY STENT INTERVENTION;  Surgeon: Iran Ouch, MD;  Location: [facility] IN[facility] CV [facility];                Service: Cardiovascular;  Laterality: N/A; No date: DIAGNOSTIC LAPAROSCOPY     Comment:  adhesions 02/13/2014: KNEE ARTHROSCOPY; Left     Comment:  Procedure: LEFT ARTHROSCOPY KNEE, PARTIAL MEDIAL               MENISECTOMY AND PLICA, CHONDROPLASTY OF PATELLA FEMORAL               JOINT;  Surgeon: Harvie Junior, MD;  Location:               SURGERY CENTER;  Service:  Orthopedics;  Laterality: Left; 10/18/2022: LEFT HEART CATH AND CORONARY ANGIOGRAPHY; N/A     Comment:  Procedure: LEFT HEART CATH AND CORONARY ANGIOGRAPHY;                Surgeon: Iran Ouch, MD;  Location: [facility] INVASIVE               CV LAB;  Service: Cardiovascular;  Laterality: N/A; 05/30/2017: LUMBAR LAMINECTOMY/DECOMPRESSION MICRODISCECTOMY; N/A     Comment:  Procedure: LUMBAR LAMINECTOMY/DECOMPRESSION               MICRODISCECTOMY 3 LEVELS-L3-S1;  Surgeon: Venetia Night, MD;  Location: [facility] ORS;  Service: Neurosurgery;              Laterality: N/A; 05/27/2021: LUMBAR LAMINECTOMY/DECOMPRESSION MICRODISCECTOMY; N/A     Comment:  Procedure: L4-S1 DECOMPRESSION;  Surgeon: Venetia Night, MD;  Location: [facility] OR[facility];  Service: Neurosurgery;              Laterality: N/A; 03/22/2016: TENDON REPAIR; Left     Comment:  Procedure: GNFAOZ TENDON REPAIR;  Surgeon: Gwyneth Revels, DPM;  Location: [facility];  Service:               Podiatry;  Laterality: Left; No date: TONSILLECTOMY No date: UPPER GI ENDOSCOPY  BMI    Body Mass Index: 32.45 kg/m      Reproductive/Obstetrics negative OB ROS                             Anesthesia Physical Anesthesia Plan  ASA: 3  Anesthesia Plan: General   Post-op Pain Management:    Induction: Intravenous  PONV Risk Score and Plan: Ondansetron, Dexamethasone and Treatment may vary due to age or medical condition  Airway Management Planned: Natural Airway and Nasal Cannula  Additional Equipment:   Intra-op Plan:   Post-operative Plan: Extubation in OR  Informed Consent: I have reviewed the patients History and Physical, chart, labs and discussed the procedure including the risks, benefits and alternatives for the proposed anesthesia with the patient or authorized representative who has indicated his/her understanding and acceptance.     Dental Advisory  Given  Plan Discussed with: Anesthesiologist, CRNA and Surgeon  Anesthesia  Plan Comments: (Patient consented for risks of anesthesia including but not limited to:  - adverse reactions to medications - risk of airway placement if required - damage to eyes, teeth, lips or other oral mucosa - nerve damage due to positioning  - sore throat or hoarseness - Damage to heart, brain, nerves, lungs, other parts of body or loss of life  Patient voiced understanding and assent.)       Anesthesia Quick Evaluation

## 2023-06-19 NOTE — Interval H&P Note (Signed)
History and Physical Interval Note:  06/19/2023 7:26 AM  Tammy Lamb  has presented today for surgery, with the diagnosis of hiatal hernia.  The various methods of treatment have been discussed with the patient and family. After consideration of risks, benefits and other options for treatment, the patient has consented to  Procedure(s): XI ROBOTIC ASSISTED PARAESOPHAGEAL HERNIA REPAIR, RNFA to assist (N/A) as a surgical intervention.  The patient's history has been reviewed, patient examined, no change in status, stable for surgery.  I have reviewed the patient's chart and labs.  Questions were answered to the patient's satisfaction.     Ramell Wacha F Anjelique Makar

## 2023-06-19 NOTE — Anesthesia Procedure Notes (Signed)
Procedure Name: Intubation Date/Time: 06/19/2023 7:43 AM  Performed by: Lanell Matar, CRNAPre-anesthesia Checklist: Patient identified, Emergency Drugs available, Suction available and Patient being monitored Patient Re-evaluated:Patient Re-evaluated prior to induction Oxygen Delivery Method: Circle System Utilized Preoxygenation: Pre-oxygenation with 100% oxygen Induction Type: IV induction Ventilation: Mask ventilation without difficulty Laryngoscope Size: McGrath and 4 Grade View: Grade I Tube type: Oral Tube size: 7.0 mm Number of attempts: 1 Airway Equipment and Method: Stylet and Oral airway Placement Confirmation: ETT inserted through vocal cords under direct vision, positive ETCO2 and breath sounds checked- equal and bilateral Secured at: 20 cm Tube secured with: Tape Dental Injury: Teeth and Oropharynx as per pre-operative assessment

## 2023-06-19 NOTE — Op Note (Signed)
Robotic assisted laparoscopic repair of  paraesophageal  hernia with Bio-A Mesh and Partial 320 degree fundoplication  Pre-operative Diagnosis: GERD, hiatal hernia  Post-operative Diagnosis: same  Procedure:  Robotic assisted laparoscopic repair of  paraesophageal  hernia with Bio-A Mesh and Partial 320 degree fundoplication  Surgeon: Sterling Big, MD FACS  Assistant: Gaston Islam RNFA Required due to the complexity of the case the need for exposure and lack of first assist.  Anesthesia: Gen. 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 The benefits, complications, treatment options, and expected outcomes were discussed with the patient. The risks of bleeding, infection, recurrence of symptoms, failure to resolve symptoms,  esophageal damage, Dysphagia, bowel injury, any of which could require further surgery were reviewed with the patient. The likelihood of improving the patient's symptoms with return to their baseline status is good.  The patient and/or family concurred with the proposed plan, giving informed consent.  The patient was taken to Operating Room, identified  and the procedure verified.  A Time Out was held and the above information confirmed.  Prior to the induction of general anesthesia, antibiotic prophylaxis was administered. VTE prophylaxis was in place. General endotracheal anesthesia was then administered and tolerated well. After the induction, the abdomen was prepped with Chloraprep and draped in the sterile fashion. The patient was positioned in the supine position.  Cut down technique was used to enter the abdominal cavity and a Hasson trochar was placed after two vicryl  stitches were anchored to the fascia. Pneumoperitoneum was then created with CO2 and tolerated well without any adverse changes in the patient's vital signs.  Three 8-mm ports were placed under direct vision. All skin incisions  were infiltrated with a local anesthetic agent before making the incision and placing the trocars. An additional 5 mm regular laparoscopic port was placed to assist with retraction and exposure.   The patient was positioned  in reverse Trendelenburg, robot was brought to the surgical field and docked in the standard fashion.  We made sure all the instrumentation was kept indirect view at all times and that there were no collision between the arms. I scrubbed out and went to the console.  I used a robotic arm to retract the liver, the vessel sealer on my right hand and a forced bipolar grasper on my left hand.  There is along the extra 5 mm port allow me ample exposure and the ability to perform meticulous dissection. Inspection revealed a giant type IV paraesophageal w the entire stomach in the mediastinum and part of the T. Colon. We were able to reduce the colon.  We Started dividing the lesser omentum via the pars flaccida.  We Were able to dissect the lesser curvature of the stomach and  dissected the fundus free from the right and left crus.  We circumferentially dissected the GE junction.  The hernia sac was also completely reduced and we were able to bring the stomach into the intra-abdominal position.  Attention then was turned to the greater curvature where the short gastrics were divided with sealer device.  We were able to identify the left crus and again  were able to make sure there was a good circumferential dissection and that the hernia sac was completely excised.  We did perform a good dissection within the mediastinum to allow a complete reduction of the sac, gain esophageal length and a to completely allow an intra-abdominal fundoplication. This hernia had  significant inflammatory response and her tissue were friable. Upon pulling the hernia sac we noticed a small defect in the Right pleura. No evidence of tension physiology was seen. Please note this was a challenging case due to the amount of dissection that was needed due to the type IV giant hernia. Significant additional time was required and due to this I applied a 22 modifier. Using two strips of Bio-A as pledgets we approximated the crus with a 2-0V Stratafix  suture. A bio-A 10x7 cm mesh was inserted and secured using Vistaseal.   We Asked anesthesia to place a 50 French bougie and this went easily.  We also observe trajectory of the bougie. 320 degree  fundoplication was created with multiple 2-0 silk sutures and we placed 3 stitches taking some of the esophagus within that bite.  The fundoplication measured approximately 3-1/2 cm and he was floppy. I was very happy with the way the fundoplication laid and the repair of the hernia.  Inspection of the  upper quadrant was performed. No bleeding, bile  Or esophageal injuries leaks, or bowel injuries were noted. I asked anesthesia to inject ICG via OG tube. No evidence of gastric or esophageal injuries were seen. Robotic instruments and robotic arms were undocked in the standard fashion. All the needles were removed under direct visualization.   I scrubbed back in.  Pneumoperitoneum was released.  We asked anesthesia to placed pt in Tburg so we can evacuate the pneumo in its totality. The periumbilical port site was closed with interrumpted 0 Vicryl sutures. 4-0 subcuticular Monocryl was used to close the skin. Liposomal marcaine was injected to all the incisions sites. Given that there was no mechanical respiratory issues I decided not to place a chest tube. Dermabond was  applied.  The patient was then extubated and brought to the recovery room in stable condition. Sponge, lap, and needle counts were correct at closure and at the conclusion of the  case.               Sterling Big, MD, FACS

## 2023-06-20 ENCOUNTER — Encounter: Payer: Self-pay | Admitting: Surgery

## 2023-06-20 ENCOUNTER — Observation Stay: Payer: Medicare Other

## 2023-06-20 DIAGNOSIS — I6523 Occlusion and stenosis of bilateral carotid arteries: Secondary | ICD-10-CM | POA: Diagnosis present

## 2023-06-20 DIAGNOSIS — F419 Anxiety disorder, unspecified: Secondary | ICD-10-CM | POA: Diagnosis present

## 2023-06-20 DIAGNOSIS — E669 Obesity, unspecified: Secondary | ICD-10-CM | POA: Diagnosis present

## 2023-06-20 DIAGNOSIS — K449 Diaphragmatic hernia without obstruction or gangrene: Secondary | ICD-10-CM | POA: Diagnosis present

## 2023-06-20 DIAGNOSIS — Z9889 Other specified postprocedural states: Secondary | ICD-10-CM | POA: Diagnosis present

## 2023-06-20 DIAGNOSIS — Z8616 Personal history of COVID-19: Secondary | ICD-10-CM | POA: Diagnosis not present

## 2023-06-20 DIAGNOSIS — G20A1 Parkinson's disease without dyskinesia, without mention of fluctuations: Secondary | ICD-10-CM | POA: Diagnosis present

## 2023-06-20 DIAGNOSIS — R7989 Other specified abnormal findings of blood chemistry: Secondary | ICD-10-CM | POA: Diagnosis not present

## 2023-06-20 DIAGNOSIS — K5190 Ulcerative colitis, unspecified, without complications: Secondary | ICD-10-CM | POA: Diagnosis not present

## 2023-06-20 DIAGNOSIS — D649 Anemia, unspecified: Secondary | ICD-10-CM | POA: Diagnosis present

## 2023-06-20 DIAGNOSIS — I10 Essential (primary) hypertension: Secondary | ICD-10-CM | POA: Diagnosis not present

## 2023-06-20 DIAGNOSIS — Z8719 Personal history of other diseases of the digestive system: Secondary | ICD-10-CM | POA: Diagnosis not present

## 2023-06-20 DIAGNOSIS — N3010 Interstitial cystitis (chronic) without hematuria: Secondary | ICD-10-CM | POA: Diagnosis present

## 2023-06-20 DIAGNOSIS — I272 Pulmonary hypertension, unspecified: Secondary | ICD-10-CM | POA: Diagnosis present

## 2023-06-20 DIAGNOSIS — K219 Gastro-esophageal reflux disease without esophagitis: Secondary | ICD-10-CM | POA: Diagnosis present

## 2023-06-20 DIAGNOSIS — I70219 Atherosclerosis of native arteries of extremities with intermittent claudication, unspecified extremity: Secondary | ICD-10-CM | POA: Diagnosis present

## 2023-06-20 DIAGNOSIS — E1151 Type 2 diabetes mellitus with diabetic peripheral angiopathy without gangrene: Secondary | ICD-10-CM | POA: Diagnosis present

## 2023-06-20 DIAGNOSIS — M1711 Unilateral primary osteoarthritis, right knee: Secondary | ICD-10-CM | POA: Diagnosis present

## 2023-06-20 DIAGNOSIS — I25110 Atherosclerotic heart disease of native coronary artery with unstable angina pectoris: Secondary | ICD-10-CM | POA: Diagnosis present

## 2023-06-20 DIAGNOSIS — J441 Chronic obstructive pulmonary disease with (acute) exacerbation: Secondary | ICD-10-CM | POA: Diagnosis present

## 2023-06-20 DIAGNOSIS — M5416 Radiculopathy, lumbar region: Secondary | ICD-10-CM | POA: Diagnosis present

## 2023-06-20 DIAGNOSIS — I110 Hypertensive heart disease with heart failure: Secondary | ICD-10-CM | POA: Diagnosis present

## 2023-06-20 DIAGNOSIS — E440 Moderate protein-calorie malnutrition: Secondary | ICD-10-CM | POA: Diagnosis present

## 2023-06-20 DIAGNOSIS — J9601 Acute respiratory failure with hypoxia: Secondary | ICD-10-CM | POA: Diagnosis present

## 2023-06-20 DIAGNOSIS — I2489 Other forms of acute ischemic heart disease: Secondary | ICD-10-CM | POA: Diagnosis present

## 2023-06-20 DIAGNOSIS — G894 Chronic pain syndrome: Secondary | ICD-10-CM | POA: Diagnosis present

## 2023-06-20 DIAGNOSIS — E871 Hypo-osmolality and hyponatremia: Secondary | ICD-10-CM | POA: Diagnosis present

## 2023-06-20 DIAGNOSIS — I5033 Acute on chronic diastolic (congestive) heart failure: Secondary | ICD-10-CM | POA: Diagnosis present

## 2023-06-20 DIAGNOSIS — E782 Mixed hyperlipidemia: Secondary | ICD-10-CM | POA: Diagnosis present

## 2023-06-20 LAB — SURGICAL PATHOLOGY

## 2023-06-20 MED ORDER — KETOROLAC TROMETHAMINE 15 MG/ML IJ SOLN
INTRAMUSCULAR | Status: AC
  Filled 2023-06-20: qty 1

## 2023-06-20 MED ORDER — METOPROLOL TARTRATE 25 MG PO TABS
ORAL_TABLET | ORAL | Status: AC
  Filled 2023-06-20: qty 1

## 2023-06-20 MED ORDER — IOHEXOL 300 MG/ML  SOLN
150.0000 mL | Freq: Once | INTRAMUSCULAR | Status: AC | PRN
  Administered 2023-06-20: 125 mL via ORAL

## 2023-06-20 MED ORDER — OXYCODONE HCL 5 MG PO TABS
ORAL_TABLET | ORAL | Status: AC
  Filled 2023-06-20: qty 1

## 2023-06-20 MED ORDER — GABAPENTIN 300 MG PO CAPS
ORAL_CAPSULE | ORAL | Status: AC
  Filled 2023-06-20: qty 3

## 2023-06-20 NOTE — Plan of Care (Signed)
  Problem: Activity: Goal: Risk for activity intolerance will decrease Outcome: Progressing   Problem: Pain Management: Goal: General experience of comfort will improve Outcome: Progressing

## 2023-06-20 NOTE — Progress Notes (Signed)
[facility] SURGICAL PROGRESS NOTE  Hospital Day(s): 0.   Post op day(s): 1 Day Post-Op.   Interval History:  Patient seen and examined No acute events or new complaints overnight.  Patient reports she is doing okay Feels like she is having some spasm in her throat/neck Some trouble swallowing, especially with pills Incisional soreness No fever, chills, nausea No new labs this morning She is requiring supplemental O2 this AM; 2L She is on FLD  Vital signs in last 24 hours: [min-max] current  Temp:  [97.2 F (36.2 C)-97.6 F (36.4 C)] 97.6 F (36.4 C) (11/27 0500) Pulse Rate:  [60-67] 61 (11/27 0500) Resp:  [12-22] 16 (11/27 0500) BP: (108-153)/(62-84) 121/72 (11/27 0500) SpO2:  [90 %-99 %] 92 % (11/27 0500)     Height: 5\' 5"  (165.1 cm) Weight: 90.7 kg BMI (Calculated): 33.28   Intake/Output last 2 shifts:  11/26 0701 - 11/27 0700 In: 1550 [I.V.:1300; IV Piggyback:250] Out: 315 [Urine:265; Blood:50]   Physical Exam:  Constitutional: alert, cooperative and no distress  Respiratory: breathing non-labored at rest; on [place] Cardiovascular: regular rate and sinus rhythm  Gastrointestinal: soft, incisional soreness, and non-distended Integumentary: Laparoscopic incisions are CDI with dermabond, early ecchymosis, no erythema   Labs:     Latest Ref Rng & Units 06/14/2023    1:47 PM 10/20/2022    6:25 AM 10/19/2022    5:41 AM  CBC  WBC 4.0 - 10.5 K/uL 5.6  5.7  6.5   Hemoglobin 12.0 - 15.0 g/dL 16.1  09.6  04.5   Hematocrit 36.0 - 46.0 % 31.7  35.3  35.5   Platelets 150 - 400 K/uL 366  232  238       Latest Ref Rng & Units 06/14/2023    1:47 PM 10/20/2022    6:25 AM 10/19/2022    5:41 AM  CMP  Glucose 70 - 99 mg/dL 83  86  71   BUN 8 - 23 mg/dL 9  10  9    Creatinine 0.44 - 1.00 mg/dL 4.09  8.11  9.14   Sodium 135 - 145 mmol/L 136  134  134   Potassium 3.5 - 5.1 mmol/L 4.2  3.6  4.2   Chloride 98 - 111 mmol/L 101  101  99   CO2 22 - 32 mmol/L 26  25  27     Calcium 8.9 - 10.3 mg/dL 8.9  8.7  8.8     Imaging studies: No new pertinent imaging studies   Assessment/Plan:  72 y.o. female 1 Day Post-Op s/p robotic assisted laparoscopic paraesophageal hernia repair and partial 320-degree fundoplication   - Out of caution, and given her swallowing symptoms, will get UGI to assess repair and ensure no leak/obstruction. Anticipate this being edematous given the size of her hernia   - Will continue FLD - Reviewed dietary restrictions and left hand out at bedside - Monitor abdominal examination; on-going bowel function  - Wean from O2 as tolerated - She does have recent cardiac history  - Pain control prn; antiemetics prn  - Mobilize as tolerated    - Discharge Planning: I do think she will probably benefit from another 24 hours. Will get UGI for reassurance. Will transfer to floor as well.    All of the above findings and recommendations were discussed with the patient, patient's family at bedside, and the medical team, and all of patient's and family's questions were answered to their expressed satisfaction.  -- Lynden Oxford, PA-C [facility] 06/20/2023, 8:54 AM M-F: 7am - 4pm

## 2023-06-20 NOTE — Progress Notes (Signed)
Pt transferred to room 226, family at bedside. Bedside report given to [place], RN

## 2023-06-20 NOTE — Plan of Care (Signed)
  Problem: Clinical Measurements: Goal: Respiratory complications will improve Outcome: Progressing   Problem: Activity: Goal: Risk for activity intolerance will decrease Outcome: Progressing   Problem: Coping: Goal: Level of anxiety will decrease Outcome: Progressing   Problem: Elimination: Goal: Will not experience complications related to urinary retention Outcome: Progressing   Problem: Pain Management: Goal: General experience of comfort will improve Outcome: Progressing

## 2023-06-20 NOTE — Discharge Instructions (Signed)
In addition to included general post-operative instruction,  Diet: Recommend following Nissen diet recommendations for 4 weeks minimum. Hand out given   Activity: No heavy lifting >20 pounds (children, pets, laundry, garbage) or strenuous activity for 4 weeks, but light activity and walking are encouraged. Do not drive or drink alcohol if taking narcotic pain medications or having pain that might distract from driving.  Wound care: 2 days after surgery (11/28), you may shower/get incision wet with soapy water and pat dry (do not rub incisions), but no baths or submerging incision underwater until follow-up.   Medications: Resume all home medications. For mild to moderate pain: acetaminophen (Tylenol) or ibuprofen/naproxen (if no kidney disease). Combining Tylenol with alcohol can substantially increase your risk of causing liver disease. Narcotic pain medications, if prescribed, can be used for severe pain, though may cause nausea, constipation, and drowsiness. Do not combine Tylenol and Percocet (or similar) within a 6 hour period as Percocet (and similar) contain(s) Tylenol. If you do not need the narcotic pain medication, you do not need to fill the prescription.  Call office [phone] / [phone]) at any time if any questions, worsening pain, fevers/chills, bleeding, drainage from incision site, or other concerns.

## 2023-06-21 ENCOUNTER — Inpatient Hospital Stay: Payer: Medicare Other

## 2023-06-21 DIAGNOSIS — Z8719 Personal history of other diseases of the digestive system: Secondary | ICD-10-CM

## 2023-06-21 DIAGNOSIS — E871 Hypo-osmolality and hyponatremia: Secondary | ICD-10-CM | POA: Insufficient documentation

## 2023-06-21 DIAGNOSIS — E46 Unspecified protein-calorie malnutrition: Secondary | ICD-10-CM | POA: Insufficient documentation

## 2023-06-21 DIAGNOSIS — J9601 Acute respiratory failure with hypoxia: Secondary | ICD-10-CM

## 2023-06-21 DIAGNOSIS — R7989 Other specified abnormal findings of blood chemistry: Secondary | ICD-10-CM | POA: Insufficient documentation

## 2023-06-21 DIAGNOSIS — F32A Depression, unspecified: Secondary | ICD-10-CM | POA: Insufficient documentation

## 2023-06-21 DIAGNOSIS — Z9889 Other specified postprocedural states: Secondary | ICD-10-CM

## 2023-06-21 DIAGNOSIS — D649 Anemia, unspecified: Secondary | ICD-10-CM | POA: Insufficient documentation

## 2023-06-21 DIAGNOSIS — J449 Chronic obstructive pulmonary disease, unspecified: Secondary | ICD-10-CM

## 2023-06-21 LAB — TROPONIN I (HIGH SENSITIVITY)
Troponin I (High Sensitivity): 27 ng/L — ABNORMAL HIGH (ref <18)
Troponin I (High Sensitivity): 37 ng/L — ABNORMAL HIGH (ref <18)

## 2023-06-21 LAB — CBC WITH DIFFERENTIAL/PLATELET
Abs Immature Granulocytes: 0.05 10*3/uL (ref 0.00–0.07)
Basophils Absolute: 0 10*3/uL (ref 0.0–0.1)
Basophils Relative: 1 %
Eosinophils Absolute: 0 10*3/uL (ref 0.0–0.5)
Eosinophils Relative: 1 %
HCT: 25.4 % — ABNORMAL LOW (ref 36.0–46.0)
Hemoglobin: 8.4 g/dL — ABNORMAL LOW (ref 12.0–15.0)
Immature Granulocytes: 1 %
Lymphocytes Relative: 15 %
Lymphs Abs: 1.2 10*3/uL (ref 0.7–4.0)
MCH: 27.3 pg (ref 26.0–34.0)
MCHC: 33.1 g/dL (ref 30.0–36.0)
MCV: 82.5 fL (ref 80.0–100.0)
Monocytes Absolute: 1.5 10*3/uL — ABNORMAL HIGH (ref 0.1–1.0)
Monocytes Relative: 18 %
Neutro Abs: 5.5 10*3/uL (ref 1.7–7.7)
Neutrophils Relative %: 64 %
Platelets: 225 10*3/uL (ref 150–400)
RBC: 3.08 MIL/uL — ABNORMAL LOW (ref 3.87–5.11)
RDW: 14.7 % (ref 11.5–15.5)
WBC: 8.4 10*3/uL (ref 4.0–10.5)
nRBC: 0 % (ref 0.0–0.2)

## 2023-06-21 LAB — COMPREHENSIVE METABOLIC PANEL
ALT: 51 U/L — ABNORMAL HIGH (ref 0–44)
AST: 85 U/L — ABNORMAL HIGH (ref 15–41)
Albumin: 3.3 g/dL — ABNORMAL LOW (ref 3.5–5.0)
Alkaline Phosphatase: 35 U/L — ABNORMAL LOW (ref 38–126)
Anion gap: 7 (ref 5–15)
BUN: 14 mg/dL (ref 8–23)
CO2: 23 mmol/L (ref 22–32)
Calcium: 8 mg/dL — ABNORMAL LOW (ref 8.9–10.3)
Chloride: 97 mmol/L — ABNORMAL LOW (ref 98–111)
Creatinine, Ser: 0.96 mg/dL (ref 0.44–1.00)
GFR, Estimated: >60 mL/min (ref >60)
Glucose, Bld: 117 mg/dL — ABNORMAL HIGH (ref 70–99)
Potassium: 3.7 mmol/L (ref 3.5–5.1)
Sodium: 127 mmol/L — ABNORMAL LOW (ref 135–145)
Total Bilirubin: 0.4 mg/dL (ref <1.2)
Total Protein: 5.5 g/dL — ABNORMAL LOW (ref 6.5–8.1)

## 2023-06-21 LAB — TYPE AND SCREEN
ABO/RH(D): O POS
Antibody Screen: NEGATIVE

## 2023-06-21 LAB — D-DIMER, QUANTITATIVE: D-Dimer, Quant: 2.06 ug{FEU}/mL — ABNORMAL HIGH (ref 0.00–0.50)

## 2023-06-21 LAB — PROCALCITONIN: Procalcitonin: <0.1 ng/mL

## 2023-06-21 MED ORDER — FLUTICASONE FUROATE-VILANTEROL 100-25 MCG/ACT IN AEPB
1.0000 | INHALATION_SPRAY | Freq: Every day | RESPIRATORY_TRACT | Status: DC
  Administered 2023-06-22 – 2023-06-23 (×2): 1 via RESPIRATORY_TRACT
  Filled 2023-06-21: qty 28

## 2023-06-21 MED ORDER — ENOXAPARIN SODIUM 60 MG/0.6ML IJ SOSY
45.0000 mg | PREFILLED_SYRINGE | INTRAMUSCULAR | Status: DC
  Administered 2023-06-21 – 2023-06-23 (×3): 45 mg via SUBCUTANEOUS
  Filled 2023-06-21 (×3): qty 0.6

## 2023-06-21 MED ORDER — SODIUM CHLORIDE 0.9 % IV SOLN: INTRAVENOUS | Status: DC

## 2023-06-21 MED ORDER — ALBUTEROL SULFATE (2.5 MG/3ML) 0.083% IN NEBU: 2.5000 mg | INHALATION_SOLUTION | Freq: Four times a day (QID) | RESPIRATORY_TRACT | Status: DC | PRN

## 2023-06-21 MED ORDER — IOHEXOL 350 MG/ML SOLN
75.0000 mL | Freq: Once | INTRAVENOUS | Status: AC | PRN
  Administered 2023-06-21: 75 mL via INTRAVENOUS

## 2023-06-21 MED ORDER — ALBUTEROL SULFATE (2.5 MG/3ML) 0.083% IN NEBU
2.5000 mg | INHALATION_SOLUTION | Freq: Four times a day (QID) | RESPIRATORY_TRACT | Status: DC
  Administered 2023-06-21 – 2023-06-22 (×4): 2.5 mg via RESPIRATORY_TRACT
  Filled 2023-06-21 (×4): qty 3

## 2023-06-21 MED ORDER — HYDRALAZINE HCL 20 MG/ML IJ SOLN: 5.0000 mg | Freq: Four times a day (QID) | INTRAMUSCULAR | Status: DC | PRN

## 2023-06-21 MED ORDER — BUDESON-GLYCOPYRROL-FORMOTEROL 160-9-4.8 MCG/ACT IN AERO: 2.0000 | INHALATION_SPRAY | Freq: Two times a day (BID) | RESPIRATORY_TRACT | Status: DC

## 2023-06-21 MED ORDER — MORPHINE SULFATE (PF) 2 MG/ML IV SOLN: 2.0000 mg | INTRAVENOUS | Status: DC | PRN

## 2023-06-21 MED ORDER — HYDROXYZINE HCL 25 MG PO TABS
25.0000 mg | ORAL_TABLET | Freq: Every evening | ORAL | Status: DC | PRN
  Administered 2023-06-22: 25 mg via ORAL
  Filled 2023-06-21: qty 1

## 2023-06-21 MED ORDER — UMECLIDINIUM BROMIDE 62.5 MCG/ACT IN AEPB
1.0000 | INHALATION_SPRAY | Freq: Every day | RESPIRATORY_TRACT | Status: DC
  Administered 2023-06-22 – 2023-06-23 (×2): 1 via RESPIRATORY_TRACT

## 2023-06-21 MED ORDER — OXYCODONE HCL 5 MG PO TABS
5.0000 mg | ORAL_TABLET | Freq: Four times a day (QID) | ORAL | Status: DC | PRN
  Administered 2023-06-22 – 2023-06-23 (×3): 5 mg via ORAL
  Filled 2023-06-21 (×4): qty 1

## 2023-06-21 MED ORDER — BUDESON-GLYCOPYRROL-FORMOTEROL 160-9-4.8 MCG/ACT IN AERO: 2.0000 | INHALATION_SPRAY | Freq: Four times a day (QID) | RESPIRATORY_TRACT | Status: DC

## 2023-06-21 NOTE — Assessment & Plan Note (Signed)
-  Dietitian has been consulted ?

## 2023-06-21 NOTE — Progress Notes (Signed)
[facility]ist Note  D-dimer, quantitative resulted as positive at 2.06  CTA for PE has been ordered to assess for pulmonary embolism   Dr. Sedalia Muta

## 2023-06-21 NOTE — Assessment & Plan Note (Signed)
Per primary team

## 2023-06-21 NOTE — Assessment & Plan Note (Signed)
Home duloxetine 60 mg, citalopram 20 mg daily were resumed Hydroxyzine 25 mg nightly as needed for anxiety ordered

## 2023-06-21 NOTE — Assessment & Plan Note (Signed)
Metoprolol tartrate 25 mg p.o. twice daily.

## 2023-06-21 NOTE — Assessment & Plan Note (Signed)
Normocytic normochromic acute anemia Hemoglobin was 8.4 on recheck on consultation Patient's baseline hemoglobin range is 10.3-12.2 Will check anemia panel in the a.m. Type and screen Recheck CBC in the a.m.

## 2023-06-21 NOTE — Assessment & Plan Note (Addendum)
Etiology workup in progress Chest x-ray ordered by primary care team reviewed showing under inflation with small pleural effusions.  Increasing left lung base opacity, pneumonia cannot be excluded at this time.  Check procalcitonin on admission, if elevated will initiate community-acquired pneumonia coverage CMP and CBC with differentials ordered Discontinue home tizanidine in setting of COPD; I would recommend against all tizanidine and other muscle relaxer use at home as patient has COPD as this can relax the diaphragm prevent the organ from performing respiratory function Ordered home Breztri Aerosphere per home dosing, twice daily Pulse oximetry, continuous ordered Telemetry ordered Incentive spirometry, flutter valve; 5-10 reps per device, every 2 hours while awake Consult to respiratory care team to teach patient appropriate incentive spirometry and flutter valve use technique Oxycodone immediate release 5 mg every chain to 6 hours as needed for moderate pain; morphine 2 mg IV changed to every 4 hours as needed for severe pain Maintain goal SpO2 is greater than 92% Vital signs: Instruction to complete full set of vitals and updated in epic on consultation with care order/instruction to please complete full set of vital signs placed Continue inpatient, MedSurg admission  Addendum: Procalcitonin was negative.  No indication for antibiotics at this time.  Given patient with increasing O2 requirement, suspect multifactorial in setting of muscle relaxer use and atelectasis in setting of recent surgery.  We will check a D-dimer, if elevated we will order a CTA to assess for PE.

## 2023-06-21 NOTE — Assessment & Plan Note (Signed)
Suspect secondary to poor p.o. intake Sodium chloride infusion at 100 mL/h, 1 day ordered

## 2023-06-21 NOTE — Assessment & Plan Note (Addendum)
Query reactive in setting of recent surgery Sodium chloride infusion at 100 mL/h, 1 day ordered Recheck CMP in the a.m.

## 2023-06-21 NOTE — Assessment & Plan Note (Signed)
Home sulfasalazine 500 mg p.o. twice daily

## 2023-06-21 NOTE — Progress Notes (Signed)
PHARMACIST - PHYSICIAN COMMUNICATION  CONCERNING:  Enoxaparin (Lovenox) for DVT Prophylaxis   RECOMMENDATION: Patient was prescribed enoxaprin 40mg  q24 hours for VTE prophylaxis.   Filed Weights   06/19/23 0643  Weight: 90.7 kg (200 lb)    Body mass index is 33.28 kg/m.  Estimated Creatinine Clearance: 67.4 mL/min (by C-G formula based on SCr of 0.84 mg/dL).   Based on [facility] policy patient is candidate for enoxaparin 0.5mg /kg TBW SQ every 24 hours based on BMI being >30.  DESCRIPTION: Pharmacy has adjusted enoxaparin dose per [facility] policy.  Patient is now receiving enoxaparin 45 mg every Q24H hours   Effie Shy, PharmD Pharmacy Resident  06/21/2023 10:59 AM

## 2023-06-21 NOTE — Progress Notes (Signed)
06/21/2023  Subjective: Patient is 2 Days Post-Op s/p robotic paraesophageal hernia repair.  Patient has had some shortness of breath and is now on 4L [place].  No tachycardia.  Yesterday had barium swallow test which showed no leaks and CXR postop did not show any pneumothorax.  Vital signs: Temp:  [97.3 F (36.3 C)-98.3 F (36.8 C)] 98.1 F (36.7 C) (11/28 0243) Pulse Rate:  [60-80] 80 (11/28 0243) Resp:  [16-20] 20 (11/28 0243) BP: (109-128)/(69-84) 122/69 (11/28 0243) SpO2:  [89 %-97 %] 90 % (11/28 0243)   Intake/Output: No intake/output data recorded. Last BM Date : 06/20/23  Physical Exam: Constitutional:  No acute distress Pulm:  on 4L [place], some increased effort, but able to speak in full sentences. Abdomen:  soft, non-distended, appropriately tender.  Incisions clean, dry, intact.  Labs:  No results for input(s): "WBC", "HGB", "HCT", "PLT" in the last 72 hours. No results for input(s): "NA", "K", "CL", "CO2", "GLUCOSE", "BUN", "CREATININE", "CALCIUM" in the last 72 hours.  Invalid input(s): "MAGNESIUM" No results for input(s): "LABPROT", "INR" in the last 72 hours.  Imaging: DG UGI W SINGLE CM (SOL OR THIN BA)  Result Date: 06/20/2023 CLINICAL DATA:  Provided history: Status post repair of paraesophageal hernia. Additional history obtained from electronic MEDICAL RECORD NUMBERPostoperative day 1 status post robotic-assisted laparoscopic repair of paraesophageal hernia with mesh and partial 320 degree fundoplication. EXAM: DG UGI W SINGLE CM TECHNIQUE: A problem-oriented water-soluble upper GI series was performed to assess for leak or obstruction status post robotic-assisted laparoscopic repair of paraesophageal hernia with mesh and partial 320 degree fundoplication. The exam was performed by Loyce Dys PA-C, and was supervised and interpreted by Dr. Jackey Loge. FLUOROSCOPY: Radiation Exposure Index (as provided by the fluoroscopic device): 10.60 mGy Kerma COMPARISON:  PET CT  05/09/2023. FINDINGS: Patulous esophagus. Tortuosity of the distal esophagus. Narrowed appearance of the distal esophagus/GE junction compatible with the provided history of fundoplication. Delayed and intermittent passage of contrast from the distal esophagus into the stomach (with incomplete esophageal clearance). A moderate volume of contrast did pass into the stomach and proximal small bowel during the examination. No evidence of a residual/recurrent hiatal hernia. No extraluminal contrast identified to suggest a post-operative leak. IMPRESSION: 1. Problem-oriented water-soluble contrast upper GI series performed to assess for post-operative leak or obstruction status post robotic-assisted laparoscopic repair of paraesophageal hernia with partial 320 degree fundoplication. 2. No evidence of a post-operative leak. 3. Delayed and intermittent passage of contrast from the distal esophagus into the stomach (with incomplete esophageal clearance), possibly secondary to postoperative edema. 4. Patulous esophagus. Nonspecific tortuosity of the distal esophagus also noted. Electronically Signed   By: Jackey Loge D.O.   On: 06/20/2023 12:37    Assessment/Plan: This is a 72 y.o. female s/p robotic assisted paraesophageal hernia repair.  --Patient has had increase in O2 requirements.  She does have significant smoking history and COPD.  Her home inhalers were ordered post-op.  Although initial post-op CXR did not show any issues, will reorder CXR this morning given increased O2 requirements.  May also need chest CT if any issues. --For now ok to continue diet.  Will hold Plavix still for now in case any procedures needed. --Likely will stay in hospital today still while working up her O2 requirement.   Howie Ill, MD [facility]

## 2023-06-21 NOTE — Assessment & Plan Note (Signed)
PDMP reviewed.

## 2023-06-21 NOTE — Consult Note (Addendum)
Initial Consultation Note  TEYGAN BREECH LKH:[id number] DOB: February 22, 1951 DOA: 06/19/2023  PCP: Enid Baas, MD  Outpatient Specialists: Dr. [name] Requesting Service: General Surgery Consult reason: Acute hypoxic respiratory failure requiring increased oxygen supplementation  I have personally briefly reviewed patient's old medical records in [facility] EMR.  HPI: [name] is a 72 year old female with history of hyperlipidemia, depression, anxiety, hypertension, COPD, osteoarthritis, chronic pain syndrome, CAD, former tobacco user, who was a direct admission for giant paraesophageal hernia status post paraesophageal hernia with bio mesh and partial fundoplication repair.  Patient is currently postop day 2.  [facility] was consulted for chief concerns of increasing oxygen requirements.  The latest vitals at the time of consultation was at 2 AM on day of consultation.  These vitals showed temperature of 98.1, respiration rate 20, heart rate of 80, blood pressure 122/69, SpO2 of 90% on nasal cannula. ----------------------------- At bedside, patient is able to tell me her name, her age, her current location, her current calendar year.  She reports that at approximately 9:30 AM today, she developed shortness of breath.  She was getting up to use the restroom when this happened.  She denies chest pain, abdominal pain that is beyond the surgical wounds.  She denies dysuria, hematuria, blood in her stool.  She endorses 2 episodes of diarrhea yesterday and the day before however this has resolved.  She denies nausea, vomiting, dysphagia, loss of consciousness or syncope.  She denies swelling of her lower extremities.  She denies pain of her lower extremities.  Social history: She lives at home with her husband.  She is a former tobacco user.  She endorses infrequent EtOH use, her last glass of wine was 2 weeks ago with her husband.  She denies recreational drug use.   Patient is retired.  ROS: Constitutional: no weight change, no fever ENT/Mouth: no sore throat, no rhinorrhea Eyes: no eye pain, no vision changes Cardiovascular: no chest pain, + dyspnea,  no edema, no palpitations Respiratory: no cough, no sputum, no wheezing Gastrointestinal: no nausea, no vomiting, no diarrhea, no constipation Genitourinary: no urinary incontinence, no dysuria, no hematuria Musculoskeletal: no arthralgias, no myalgias Skin: no skin lesions, no pruritus, Neuro: + weakness, no loss of consciousness, no syncope Psych: no anxiety, no depression, + decrease appetite Heme/Lymph: no bruising, no bleeding  Assessment/Plan  Principal Problem:   S/P repair of paraesophageal hernia Active Problems:   Acute hypoxic respiratory failure (HCC)   Benign essential HTN   Ulcerative colitis (HCC)   Chronic pain syndrome   Primary parkinsonism (HCC)   Neurogenic claudication   Lumbar radiculopathy   DDD (degenerative disc disease), cervical   Atherosclerotic peripheral vascular disease with intermittent claudication (HCC)   Hyperlipidemia, mixed   Coronary artery disease involving native coronary artery of native heart with unstable angina pectoris (HCC)   Chronic pain of left knee   Hiatal hernia with gastroesophageal reflux   COPD (chronic obstructive pulmonary disease) (HCC)   Elevated LFTs   Protein calorie malnutrition (HCC)   Depression   Acute anemia   Hyponatremia   Assessment and Plan:  * S/P repair of paraesophageal hernia Per primary team  Acute hypoxic respiratory failure (HCC) Etiology workup in progress Chest x-ray ordered by primary care team reviewed showing under inflation with small pleural effusions.  Increasing left lung base opacity, pneumonia cannot be excluded at this time.  Check procalcitonin on admission, if elevated will initiate community-acquired pneumonia coverage CMP and CBC with differentials  ordered Discontinue home tizanidine in  setting of COPD; I would recommend against all tizanidine and other muscle relaxer use at home as patient has COPD as this can relax the diaphragm prevent the organ from performing respiratory function Ordered home Breztri Aerosphere per home dosing, twice daily Pulse oximetry, continuous ordered Telemetry ordered Incentive spirometry, flutter valve; 5-10 reps per device, every 2 hours while awake Consult to respiratory care team to teach patient appropriate incentive spirometry and flutter valve use technique Oxycodone immediate release 5 mg every chain to 6 hours as needed for moderate pain; morphine 2 mg IV changed to every 4 hours as needed for severe pain Maintain goal SpO2 is greater than 92% Vital signs: Instruction to complete full set of vitals and updated in epic on consultation with care order/instruction to please complete full set of vital signs placed Continue inpatient, MedSurg admission  Addendum: Procalcitonin was negative.  No indication for antibiotics at this time.  Given patient with increasing O2 requirement, suspect multifactorial in setting of muscle relaxer use and atelectasis in setting of recent surgery.  We will check a D-dimer, if elevated we will order a CTA to assess for PE.  Benign essential HTN Metoprolol tartrate 25 mg p.o. twice daily  Ulcerative colitis (HCC) Home sulfasalazine 500 mg p.o. twice daily  Chronic pain syndrome PDMP reviewed  Hyponatremia Suspect secondary to poor p.o. intake Sodium chloride infusion at 100 mL/h, 1 day ordered  Acute anemia Normocytic normochromic acute anemia Hemoglobin was 8.4 on recheck on consultation Patient's baseline hemoglobin range is 10.3-12.2 Will check anemia panel in the a.m. Type and screen Recheck CBC in the a.m.  Depression Home duloxetine 60 mg, citalopram 20 mg daily were resumed Hydroxyzine 25 mg nightly as needed for anxiety ordered  Protein calorie malnutrition [facility]) Dietitian has been  consulted  Elevated LFTs Query reactive in setting of recent surgery Sodium chloride infusion at 100 mL/h, 1 day ordered Recheck CMP in the a.m.  COPD (chronic obstructive pulmonary disease) (HCC) Home long-acting inhalers equivalent resumed Albuterol nebulizer 4 times daily, 3 days ordered Continuous pulse ox ordered  Chart reviewed.   Thank you for involving [facility] and the care with Ms. Bult.  We will continue to follow.  DVT prophylaxis: Discontinue SCDs.  Enoxaparin has been ordered by primary team, first dose was 1433 on day of consultation Code Status: Full code Diet: Primary Family Communication: A phone call was offered, patient declined Disposition Plan: Per primary Admission status: MedSurg, inpatient  Past Medical History:  Diagnosis Date   Anemia    Anxiety    Aortic atherosclerosis (HCC)    Arthritis    Bilateral carotid artery stenosis    CAD (coronary artery disease), native coronary artery 10/05/2022   a.) cCTA 10/05/2022: Ca2+ = 601 (92nd %ile); LAD 383, RCA 217; b.) LHC 10/18/2022: 85% pRCA ( 3.5 x 15 mm Onyx Frontier DES)   Cerebral microvascular disease    Chronic lower back pain    Colitis, ulcerative (HCC)    COPD (chronic obstructive pulmonary disease) (HCC)    DDD (degenerative disc disease), cervical    a.) s/p ACDF C3-C4 04/09/2019   Depression    Diastolic dysfunction 10/19/2022   a.) TTE 10/19/2022: EF 60-65%, no RWMAs,  G1DD, normal RVSF; b.) TTE 12/04/2022: EF >55%. no RWMAs, G1DD, mild LAE, triv AR/MR/PR, mod TR, PASP 45   Dyspnea    GERD (gastroesophageal reflux disease)    Hiatal hernia    History of 2019 novel  coronavirus disease (COVID-19) 02/2021   HLD (hyperlipidemia)    Hypertension    Interstitial cystitis    Long-term use of aspirin therapy    Lumbar disc disease    a.) s/p L3-L5 laminectomy, decompression, and microdiscectomy 05/30/2017; b.) s/p L4-S1 decompression 05/27/2021   Motion sickness    fair rides   On  chronic clopidogrel therapy    Paraesophageal hernia    Parkinson's disease (HCC)    suspected per dr Sherryll Burger @ [facility] neuro   Pulmonary hypertension (HCC) 04/18/2021   a.) TTE 04/18/2021: PASP 42.9; b.) TTE 12/04/2022: PASP 45   PVD (peripheral vascular disease) (HCC)    T2DM (type 2 diabetes mellitus) (HCC)    TIA (transient ischemic attack) 2015   a.) no deficits; per pt her carvedilol dose was too high causing hypotension   Urinary frequency    Wears dentures    partial upper and lower   Past Surgical History:  Procedure Laterality Date   ANKLE ARTHROSCOPY Left 03/22/2016   Procedure: ANKLE ARTHROSCOPY DEBRODEMENT EXTENSIVE LEFT FLEXAR TENDON REPAIR;  Surgeon: Gwyneth Revels, DPM;  Location: [facility];  Service: Podiatry;  Laterality: Left;  WITH POPLITEAL   ANTERIOR CERVICAL DECOMP/DISCECTOMY FUSION N/A 04/09/2019   Procedure: ANTERIOR CERVICAL DECOMPRESSION/DISCECTOMY FUSION 1 LEVEL C3-4;  Surgeon: Venetia Night, MD;  Location: [facility];  Service: Neurosurgery;  Laterality: N/A;   BLADDER SUSPENSION     CHOLECYSTECTOMY     COLONOSCOPY     CORONARY STENT INTERVENTION N/A 10/18/2022   Procedure: CORONARY STENT INTERVENTION;  Surgeon: Iran Ouch, MD;  Location: [facility];  Service: Cardiovascular;  Laterality: N/A;   DIAGNOSTIC LAPAROSCOPY     adhesions   ESOPHAGOGASTRODUODENOSCOPY (EGD) WITH PROPOFOL N/A 06/06/2023   Procedure: ESOPHAGOGASTRODUODENOSCOPY (EGD) WITH PROPOFOL;  Surgeon: Sung Amabile, DO;  Location: [facility];  Service: General;  Laterality: N/A;  patient to stay on plavix and aspirin due to recent stent placement, Dr. Tonna Boehringer aware   HYSTERECTOMY ABDOMINAL WITH SALPINGO-OOPHORECTOMY     INSERTION OF MESH  06/19/2023   Procedure: INSERTION OF MESH;  Surgeon: Leafy Ro, MD;  Location: [facility];  Service: General;;  paraesophageal hernia repair   KNEE ARTHROSCOPY Left 02/13/2014   Procedure: LEFT ARTHROSCOPY KNEE, PARTIAL MEDIAL  MENISECTOMY AND PLICA, CHONDROPLASTY OF PATELLA FEMORAL JOINT;  Surgeon: Harvie Junior, MD;  Location: [facility];  Service: Orthopedics;  Laterality: Left;   LEFT HEART CATH AND CORONARY ANGIOGRAPHY N/A 10/18/2022   Procedure: LEFT HEART CATH AND CORONARY ANGIOGRAPHY;  Surgeon: Iran Ouch, MD;  Location: [facility];  Service: Cardiovascular;  Laterality: N/A;   LUMBAR LAMINECTOMY/DECOMPRESSION MICRODISCECTOMY N/A 05/30/2017   Procedure: LUMBAR LAMINECTOMY/DECOMPRESSION MICRODISCECTOMY 3 LEVELS-L3-S1;  Surgeon: Venetia Night, MD;  Location: [facility];  Service: Neurosurgery;  Laterality: N/A;   LUMBAR LAMINECTOMY/DECOMPRESSION MICRODISCECTOMY N/A 05/27/2021   Procedure: L4-S1 DECOMPRESSION;  Surgeon: Venetia Night, MD;  Location: [facility];  Service: Neurosurgery;  Laterality: N/A;   TENDON REPAIR Left 03/22/2016   Procedure: QMVHQI TENDON REPAIR;  Surgeon: Gwyneth Revels, DPM;  Location: [facility];  Service: Podiatry;  Laterality: Left;   TONSILLECTOMY     XI ROBOTIC ASSISTED PARAESOPHAGEAL HERNIA REPAIR N/A 06/19/2023   Procedure: XI ROBOTIC ASSISTED PARAESOPHAGEAL HERNIA REPAIR, RNFA to assist;  Surgeon: Leafy Ro, MD;  Location: [facility];  Service: General;  Laterality: N/A;   Social History:  reports that she quit smoking about 8 months ago. Her smoking use included cigarettes.  She started smoking about 56 years ago. She has a 56.2 pack-year smoking history. She has been exposed to tobacco smoke. She has never used smokeless tobacco. She reports that she does not currently use alcohol. She reports that she does not use drugs.  Allergies  Allergen Reactions   Aspirin Other (See Comments)    GERD   Sulfa Antibiotics Nausea And Vomiting   Family History  Problem Relation Age of Onset   Diabetes Mother    Hypertension Mother    Hypertension Father    COPD Father    Heart disease Father    Breast cancer Neg Hx    Family history:  Family history reviewed and not pertinent.  Prior to Admission medications   Medication Sig Start Date End Date Taking? Authorizing Provider  acetaminophen (TYLENOL) 500 MG tablet Take 1,000 mg by mouth every 6 (six) hours as needed for moderate pain or headache.   Yes [provider]  albuterol (VENTOLIN HFA) 108 (90 Base) MCG/ACT inhaler Inhale 2 puffs into the lungs every 6 (six) hours as needed for wheezing or shortness of breath. 02/13/23  Yes Salena Saner, MD  ascorbic acid (VITAMIN C) 1000 MG tablet Take 1,000 mg by mouth once a week.   Yes [provider]  aspirin EC 81 MG tablet Take 1 tablet (81 mg total) by mouth daily. Swallow whole. 10/21/22  Yes Wouk, Wilfred Curtis, MD  atorvastatin (LIPITOR) 80 MG tablet Take 80 mg by mouth at bedtime.   Yes [provider]  Budeson-Glycopyrrol-Formoterol (BREZTRI AEROSPHERE) 160-9-4.8 MCG/ACT AERO Inhale 2 puffs into the lungs in the morning and at bedtime. 05/04/23  Yes Salena Saner, MD  cephALEXin (KEFLEX) 250 MG capsule Take 250 mg by mouth daily.   Yes [provider]  cholecalciferol (VITAMIN D) 25 MCG (1000 UNIT) tablet Take 1,000 Units by mouth at bedtime.   Yes [provider]  Cinnamon 500 MG TABS Take 500 mg by mouth in the morning and at bedtime.   Yes [provider]  citalopram (CELEXA) 20 MG tablet Take 20 mg by mouth every morning.   Yes [provider]  clopidogrel (PLAVIX) 75 MG tablet Take 1 tablet (75 mg total) by mouth daily with breakfast. 12/21/22  Yes Agbor-Etang, Arlys John, MD  DULoxetine (CYMBALTA) 60 MG capsule Take 60 mg by mouth daily. 12/15/21 06/19/23 Yes [provider]  gabapentin (NEURONTIN) 300 MG capsule Take 3 capsules (900 mg total) by mouth 2 (two) times daily. 12/26/22 06/24/23 Yes Edward Jolly, MD  HYDROcodone-acetaminophen (NORCO) 7.5-325 MG tablet Take 1 tablet by mouth every 8 (eight) hours as needed for severe pain. Must last 30 days.  05/30/23 06/29/23 Yes Edward Jolly, MD  hydrOXYzine (ATARAX/VISTARIL) 25 MG tablet Take 25 mg by mouth at bedtime.   Yes [provider]  metoprolol tartrate (LOPRESSOR) 25 MG tablet Take 1 tablet (25 mg total) by mouth 2 (two) times daily. 06/14/23  Yes Flossie Dibble, NP  Multiple Vitamins-Minerals (HAIR SKIN NAILS PO) Take 1 tablet by mouth in the morning and at bedtime.   Yes [provider]  nitroGLYCERIN (NITROSTAT) 0.4 MG SL tablet Place 1 tablet (0.4 mg total) under the tongue every 5 (five) minutes as needed for chest pain. 11/01/22 06/12/23 Yes Creig Hines, NP  Omega-3 Fatty Acids (FISH OIL) 1200 MG CAPS Take 1,200 mg by mouth once a week.   Yes [provider]  oxybutynin (DITROPAN-XL) 10 MG 24 hr tablet Take 10  mg by mouth at bedtime. 12/13/20  Yes [provider]  pantoprazole (PROTONIX) 40 MG tablet Take 1 tablet (40 mg total) by mouth daily. 06/14/23  Yes Agbor-Etang, Arlys John, MD  pentosan polysulfate (ELMIRON) 100 MG capsule Take 100 mg by mouth 2 (two) times daily.   Yes [provider]  sulfaSALAzine (AZULFIDINE) 500 MG tablet Take 500 mg by mouth 2 (two) times daily.   Yes [provider]  tiZANidine (ZANAFLEX) 4 MG tablet Take 1 tablet (4 mg total) by mouth every 12 (twelve) hours as needed. 12/26/22 06/24/23 Yes Edward Jolly, MD  TURMERIC CURCUMIN PO Take 1 capsule by mouth daily.   Yes [provider]  valsartan (DIOVAN) 160 MG tablet Take 160 mg by mouth daily.   Yes [provider]  vitamin B-12 (CYANOCOBALAMIN) 1000 MCG tablet Take 1,000 mcg by mouth daily.   Yes [provider]  HYDROcodone-acetaminophen (NORCO) 7.5-325 MG tablet Take 1 tablet by mouth every 8 (eight) hours as needed for severe pain. Must last 30 days. Patient not taking: Reported on 06/12/2023 04/30/23 05/30/23  Edward Jolly, MD   Physical Exam: Vitals:   06/20/23 2025 06/20/23 2146 06/20/23 2147 06/21/23 0243  BP:  120/82   122/69  Pulse: 70 77 73 80  Resp: 16   20  Temp: 98.1 F (36.7 C)   98.1 F (36.7 C)  TempSrc: Oral   Oral  SpO2:  (!) 89% 96% 90%  Weight:      Height:       Constitutional: appears age-appropriate, frail, NAD, calm Eyes: PERRL, lids and conjunctivae normal ENMT: Mucous membranes are moist. Posterior pharynx clear of any exudate or lesions. Age-appropriate dentition. Hearing appropriate/ Neck: normal, supple, no masses, no thyromegaly Respiratory: clear to auscultation bilaterally, no wheezing, no crackles. Normal respiratory effort. No accessory muscle use.  4 L nasal cannula in place Cardiovascular: Regular rate and rhythm, no murmurs / rubs / gallops. No extremity edema. 2+ pedal pulses. No carotid bruits.  Abdomen: Obese abdomen, no tenderness, no masses palpated, no hepatosplenomegaly. Bowel sounds positive.  Musculoskeletal: no clubbing / cyanosis. No joint deformity upper and lower extremities. Good ROM, no contractures, no atrophy. Normal muscle tone.  Skin: no rashes, lesions, ulcers.  Areas of ecchymosis and recent surgical wounds consistent with laparoscopic surgery Neurologic: Sensation intact. Strength 5/5 in all 4.  Psychiatric: Normal judgment and insight. Alert and oriented x 3.  Depressed mood.   EKG: independently reviewed, showing sinus bradycardia with rate of 49, QTc 429  Chest x-ray on Admission: I personally reviewed and I agree with radiologist reading as below.  DG Chest 2 View  Result Date: 06/21/2023 CLINICAL DATA:  Shortness of breath EXAM: CHEST - 2 VIEW COMPARISON:  Chest x-ray 06/19/2023. FINDINGS: Enlarged cardiopericardial silhouette. Tortuous ectatic aorta. Prominent interstitial changes. Decreasing chest wall gas. Increasing left lung base opacity. No pneumothorax. Small pleural effusions. Osteopenia and degenerative changes. IMPRESSION: Underinflation with small pleural effusions. Increasing left lung base opacity. Enlarged cardiopericardial  silhouette with stable widening of the mediastinum. Decreasing chest wall gas. Electronically Signed   By: Karen Kays M.D.   On: 06/21/2023 13:50   DG UGI W SINGLE CM (SOL OR THIN BA)  Result Date: 06/20/2023 CLINICAL DATA:  Provided history: Status post repair of paraesophageal hernia. Additional history obtained from electronic MEDICAL RECORD NUMBERPostoperative day 1 status post robotic-assisted laparoscopic repair of paraesophageal hernia with mesh and partial 320 degree fundoplication. EXAM: DG UGI W SINGLE CM TECHNIQUE: A problem-oriented  water-soluble upper GI series was performed to assess for leak or obstruction status post robotic-assisted laparoscopic repair of paraesophageal hernia with mesh and partial 320 degree fundoplication. The exam was performed by Loyce Dys PA-C, and was supervised and interpreted by Dr. Jackey Loge. FLUOROSCOPY: Radiation Exposure Index (as provided by the fluoroscopic device): 10.60 mGy Kerma COMPARISON:  PET CT 05/09/2023. FINDINGS: Patulous esophagus. Tortuosity of the distal esophagus. Narrowed appearance of the distal esophagus/GE junction compatible with the provided history of fundoplication. Delayed and intermittent passage of contrast from the distal esophagus into the stomach (with incomplete esophageal clearance). A moderate volume of contrast did pass into the stomach and proximal small bowel during the examination. No evidence of a residual/recurrent hiatal hernia. No extraluminal contrast identified to suggest a post-operative leak. IMPRESSION: 1. Problem-oriented water-soluble contrast upper GI series performed to assess for post-operative leak or obstruction status post robotic-assisted laparoscopic repair of paraesophageal hernia with partial 320 degree fundoplication. 2. No evidence of a post-operative leak. 3. Delayed and intermittent passage of contrast from the distal esophagus into the stomach (with incomplete esophageal clearance), possibly  secondary to postoperative edema. 4. Patulous esophagus. Nonspecific tortuosity of the distal esophagus also noted. Electronically Signed   By: Jackey Loge D.O.   On: 06/20/2023 12:37    Labs on Admission: I have personally reviewed following labs  CBC: Recent Labs  Lab 06/21/23 1613  WBC 8.4  NEUTROABS 5.5  HGB 8.4*  HCT 25.4*  MCV 82.5  PLT 225   Basic Metabolic Panel: Recent Labs  Lab 06/21/23 1613  NA 127*  K 3.7  CL 97*  CO2 23  GLUCOSE 117*  BUN 14  CREATININE 0.96  CALCIUM 8.0*   GFR: Estimated Creatinine Clearance: 59 mL/min (by C-G formula based on SCr of 0.96 mg/dL).  Liver Function Tests: Recent Labs  Lab 06/21/23 1613  AST 85*  ALT 51*  ALKPHOS 35*  BILITOT 0.4  PROT 5.5*  ALBUMIN 3.3*   CBG: Recent Labs  Lab 06/19/23 1210  GLUCAP 172*   Urine analysis:    Component Value Date/Time   COLORURINE YELLOW (A) 05/23/2021 1359   APPEARANCEUR CLEAR (A) 05/23/2021 1359   LABSPEC 1.010 05/23/2021 1359   PHURINE 7.0 05/23/2021 1359   GLUCOSEU NEGATIVE 05/23/2021 1359   HGBUR NEGATIVE 05/23/2021 1359   BILIRUBINUR NEGATIVE 05/23/2021 1359   KETONESUR NEGATIVE 05/23/2021 1359   PROTEINUR NEGATIVE 05/23/2021 1359   NITRITE NEGATIVE 05/23/2021 1359   LEUKOCYTESUR MODERATE (A) 05/23/2021 1359   This document was prepared using Dragon Voice Recognition software and may include unintentional dictation errors.  Dr. Sedalia Muta [facility]  If 7PM-7AM, please contact overnight-coverage provider If 7AM-7PM, please contact day attending provider [email or web address]  06/21/2023, 5:57 PM

## 2023-06-21 NOTE — Assessment & Plan Note (Addendum)
Home long-acting inhalers equivalent resumed Albuterol nebulizer 4 times daily, 3 days ordered Continuous pulse ox ordered

## 2023-06-21 NOTE — Hospital Course (Addendum)
[name] is a 72 year old female with history of hyperlipidemia, depression, anxiety, hypertension, COPD, osteoarthritis, chronic pain syndrome, CAD, former tobacco user, who was a direct admission for giant paraesophageal hernia status post paraesophageal hernia with bio mesh and partial fundoplication repair. Hospitalist was consulted for acute hypoxia.

## 2023-06-22 DIAGNOSIS — I10 Essential (primary) hypertension: Secondary | ICD-10-CM

## 2023-06-22 DIAGNOSIS — I5033 Acute on chronic diastolic (congestive) heart failure: Secondary | ICD-10-CM | POA: Insufficient documentation

## 2023-06-22 DIAGNOSIS — Z8719 Personal history of other diseases of the digestive system: Secondary | ICD-10-CM | POA: Diagnosis not present

## 2023-06-22 DIAGNOSIS — E871 Hypo-osmolality and hyponatremia: Secondary | ICD-10-CM

## 2023-06-22 DIAGNOSIS — D649 Anemia, unspecified: Secondary | ICD-10-CM

## 2023-06-22 DIAGNOSIS — J9601 Acute respiratory failure with hypoxia: Secondary | ICD-10-CM | POA: Diagnosis not present

## 2023-06-22 DIAGNOSIS — R7989 Other specified abnormal findings of blood chemistry: Secondary | ICD-10-CM

## 2023-06-22 DIAGNOSIS — Z9889 Other specified postprocedural states: Secondary | ICD-10-CM | POA: Diagnosis not present

## 2023-06-22 DIAGNOSIS — K5190 Ulcerative colitis, unspecified, without complications: Secondary | ICD-10-CM

## 2023-06-22 LAB — FOLATE: Folate: 16.6 ng/mL (ref >5.9)

## 2023-06-22 LAB — IRON AND TIBC
Iron: 34 ug/dL (ref 28–170)
Saturation Ratios: 11 % (ref 10.4–31.8)
TIBC: 307 ug/dL (ref 250–450)
UIBC: 273 ug/dL

## 2023-06-22 LAB — COMPREHENSIVE METABOLIC PANEL
ALT: 40 U/L (ref 0–44)
AST: 61 U/L — ABNORMAL HIGH (ref 15–41)
Albumin: 3.1 g/dL — ABNORMAL LOW (ref 3.5–5.0)
Alkaline Phosphatase: 36 U/L — ABNORMAL LOW (ref 38–126)
Anion gap: 7 (ref 5–15)
BUN: 9 mg/dL (ref 8–23)
CO2: 25 mmol/L (ref 22–32)
Calcium: 8.2 mg/dL — ABNORMAL LOW (ref 8.9–10.3)
Chloride: 100 mmol/L (ref 98–111)
Creatinine, Ser: 0.79 mg/dL (ref 0.44–1.00)
GFR, Estimated: >60 mL/min (ref >60)
Glucose, Bld: 93 mg/dL (ref 70–99)
Potassium: 4 mmol/L (ref 3.5–5.1)
Sodium: 132 mmol/L — ABNORMAL LOW (ref 135–145)
Total Bilirubin: 0.8 mg/dL (ref <1.2)
Total Protein: 5.3 g/dL — ABNORMAL LOW (ref 6.5–8.1)

## 2023-06-22 LAB — CBC
HCT: 23.1 % — ABNORMAL LOW (ref 36.0–46.0)
Hemoglobin: 7.6 g/dL — ABNORMAL LOW (ref 12.0–15.0)
MCH: 27 pg (ref 26.0–34.0)
MCHC: 32.9 g/dL (ref 30.0–36.0)
MCV: 81.9 fL (ref 80.0–100.0)
Platelets: 221 10*3/uL (ref 150–400)
RBC: 2.82 MIL/uL — ABNORMAL LOW (ref 3.87–5.11)
RDW: 14.6 % (ref 11.5–15.5)
WBC: 7.1 10*3/uL (ref 4.0–10.5)
nRBC: 0 % (ref 0.0–0.2)

## 2023-06-22 LAB — RETICULOCYTES
Immature Retic Fract: 33 % — ABNORMAL HIGH (ref 2.3–15.9)
RBC.: 2.87 MIL/uL — ABNORMAL LOW (ref 3.87–5.11)
Retic Count, Absolute: 51.9 10*3/uL (ref 19.0–186.0)
Retic Ct Pct: 1.8 % (ref 0.4–3.1)

## 2023-06-22 LAB — BRAIN NATRIURETIC PEPTIDE: B Natriuretic Peptide: 439.8 pg/mL — ABNORMAL HIGH (ref 0.0–100.0)

## 2023-06-22 LAB — VITAMIN B12: Vitamin B-12: 1091 pg/mL — ABNORMAL HIGH (ref 180–914)

## 2023-06-22 LAB — FERRITIN: Ferritin: 47 ng/mL (ref 11–307)

## 2023-06-22 MED ORDER — CLOPIDOGREL BISULFATE 75 MG PO TABS
75.0000 mg | ORAL_TABLET | Freq: Every day | ORAL | Status: DC
  Administered 2023-06-22 – 2023-06-23 (×2): 75 mg via ORAL
  Filled 2023-06-22 (×2): qty 1

## 2023-06-22 MED ORDER — ALBUTEROL SULFATE (2.5 MG/3ML) 0.083% IN NEBU
2.5000 mg | INHALATION_SOLUTION | Freq: Three times a day (TID) | RESPIRATORY_TRACT | Status: DC
  Administered 2023-06-22 – 2023-06-23 (×2): 2.5 mg via RESPIRATORY_TRACT
  Filled 2023-06-22 (×2): qty 3

## 2023-06-22 MED ORDER — FUROSEMIDE 10 MG/ML IJ SOLN
40.0000 mg | Freq: Once | INTRAMUSCULAR | Status: AC
  Administered 2023-06-22: 40 mg via INTRAVENOUS
  Filled 2023-06-22: qty 4

## 2023-06-22 MED ORDER — KETOROLAC TROMETHAMINE 15 MG/ML IJ SOLN
15.0000 mg | Freq: Four times a day (QID) | INTRAMUSCULAR | Status: DC | PRN
  Administered 2023-06-22: 15 mg via INTRAVENOUS
  Filled 2023-06-22: qty 1

## 2023-06-22 MED ORDER — ENSURE ENLIVE PO LIQD
237.0000 mL | Freq: Two times a day (BID) | ORAL | Status: DC
  Administered 2023-06-22: 237 mL via ORAL

## 2023-06-22 MED ORDER — ADULT MULTIVITAMIN W/MINERALS CH
1.0000 | ORAL_TABLET | Freq: Every day | ORAL | Status: DC
  Administered 2023-06-22 – 2023-06-23 (×2): 1 via ORAL
  Filled 2023-06-22 (×2): qty 1

## 2023-06-22 NOTE — Plan of Care (Signed)

## 2023-06-22 NOTE — Progress Notes (Signed)
06/22/2023  Subjective: Patient is 3 Days Post-Op s/p robotic paraesophageal hernia repair. She says her SOB is improved, now on 3 L [place]. Given some lasix today by medicine team. CTA negative for PE.  Vital signs: Temp:  [97 F (36.1 C)-98.2 F (36.8 C)] 98.1 F (36.7 C) (11/29 0904) Pulse Rate:  [60-66] 60 (11/29 0904) Resp:  [16-18] 17 (11/29 0904) BP: (143-164)/(66-114) 162/72 (11/29 0904) SpO2:  [97 %-100 %] 100 % (11/29 0904) FiO2 (%):  [36 %] 36 % (11/29 0620)   Intake/Output: No intake/output data recorded. Last BM Date : 06/21/23  Physical Exam: Constitutional:  No acute distress Pulm:  on 3L [place], appears comfortable and able to talk in full sentences on this oxygen requireemnet  Abdomen:  soft, non-distended, appropriately tender.  Incisions clean, dry, intact. Bruising around incisions.   Labs:  Recent Labs    06/21/23 1613 06/22/23 0611  WBC 8.4 7.1  HGB 8.4* 7.6*  HCT 25.4* 23.1*  PLT 225 221   Recent Labs    06/21/23 1613 06/22/23 0611  NA 127* 132*  K 3.7 4.0  CL 97* 100  CO2 23 25  GLUCOSE 117* 93  BUN 14 9  CREATININE 0.96 0.79  CALCIUM 8.0* 8.2*   No results for input(s): "LABPROT", "INR" in the last 72 hours.  Imaging: CT Angio Chest Pulmonary Embolism (PE) W or WO Contrast  Result Date: 06/21/2023 CLINICAL DATA:  Shortness of breath, status post repair paraesophageal hernia EXAM: CT ANGIOGRAPHY CHEST WITH CONTRAST TECHNIQUE: Multidetector CT imaging of the chest was performed using the standard protocol during bolus administration of intravenous contrast. Multiplanar CT image reconstructions and MIPs were obtained to evaluate the vascular anatomy. RADIATION DOSE REDUCTION: This exam was performed according to the departmental dose-optimization program which includes automated exposure control, adjustment of the mA and/or kV according to patient size and/or use of iterative reconstruction technique. CONTRAST:  75mL OMNIPAQUE IOHEXOL 350 MG/ML SOLN  COMPARISON:  UGI 06/20/2023, PET CT 05/09/2023, chest CT 04/23/2023 FINDINGS: Cardiovascular: Satisfactory opacification of the pulmonary arteries to the segmental level. No evidence of pulmonary embolism. Borderline cardiomegaly. No pericardial effusion. Mild atherosclerosis. Coronary vascular calcification. Mediastinum/Nodes: Small foci of gas within the mediastinum consistent with recent hernia repair. Large fluid collection within the lower mediastinum. Focal gas and fluid collection in the lower mediastinum on series 4, image 70, poorly defined and measuring about 4.9 cm. Moderate air and some fluid distension of the esophagus with edematous appearance at the level of the distal mediastinum. Lungs/Pleura: Small moderate bilateral pleural effusions. Lower lobe and lingular consolidations. Emphysema. No pneumothorax Upper Abdomen: Edema at the GE junction region presumably corresponding to history of partial fundoplication. Musculoskeletal: Scattered foci of gas in the chest wall soft tissues likely postoperative. No acute osseous abnormality Review of the MIP images confirms the above findings. IMPRESSION: 1. Negative for acute pulmonary embolus. 2. Small to moderate bilateral pleural effusions with lower lobe and lingular consolidations, this may be due to atelectasis, pneumonia or aspiration. 3. Postoperative changes of recent paraesophageal hernia repair and partial fundoplication. Fair amount of gas and fluid within the lower mediastinum, probably representing postoperative gas and fluid though technically leak cannot be excluded, but none seen on postoperative UGI yesterday. Moderate air and fluid distension of the esophagus with suspected edema at the distal esophagus and GE junction, likely postoperative. 4. Emphysema. 5. Aortic atherosclerosis. Aortic Atherosclerosis (ICD10-I70.0) and Emphysema (ICD10-J43.9). Electronically Signed   By: Jasmine Pang M.D.   On: 06/21/2023  21:42     Assessment/Plan: This is a 72 y.o. female s/p robotic assisted paraesophageal hernia repair.  --Patient has had increase in O2 requirements. The medicine team has been consulted. She has no PE on CTA.Marland Kitchen She had an elevated BNP and given lasix. Continue home inhalers and appreciate assistance of medicine team. Okay to restart Plavix today --IF able to wean oxygen would be appropriate for discharge ---Continue on current diet  Baker Pierini, M.D. [facility]

## 2023-06-22 NOTE — Progress Notes (Signed)
Progress Note   Patient: Tammy Lamb LKG:[id number] DOB: 1951-02-05 DOA: 06/19/2023     2 DOS: the patient was seen and examined on 06/22/2023   Brief hospital course: [name] is a 72 year old female with history of hyperlipidemia, depression, anxiety, hypertension, COPD, osteoarthritis, chronic pain syndrome, CAD, former tobacco user, who was a direct admission for giant paraesophageal hernia status post paraesophageal hernia with bio mesh and partial fundoplication repair. Hospitalist was consulted for acute hypoxia.   Principal Problem:   S/P repair of paraesophageal hernia Active Problems:   Acute hypoxic respiratory failure (HCC)   Benign essential HTN   Ulcerative colitis (HCC)   Chronic pain syndrome   Primary parkinsonism (HCC)   Neurogenic claudication   Lumbar radiculopathy   DDD (degenerative disc disease), cervical   Atherosclerotic peripheral vascular disease with intermittent claudication (HCC)   Hyperlipidemia, mixed   Coronary artery disease involving native coronary artery of native heart with unstable angina pectoris (HCC)   Chronic pain of left knee   Hiatal hernia with gastroesophageal reflux   COPD (chronic obstructive pulmonary disease) (HCC)   Elevated LFTs   Protein calorie malnutrition (HCC)   Depression   Acute anemia   Hyponatremia   Assessment and Plan: * S/P repair of paraesophageal hernia Per primary team  Acute hypoxic respiratory failure (HCC) Acute on chronic diastolic congestive heart failure. Elevated troponin secondary to demand ischemia. Reviewed patient echocardiogram performed 09/2022, ejection fraction 60 to 65%, diastolic dysfunction. Patient received a fluid resuscitation during the surgery, she developed volume overload.  BNP was moderately elevated.  Patient was given 1 dose IV Lasix, oxygenation much better, shortness of breath much better.  Will check electrolytes tomorrow.  Acute on chronic anemia Normocytic  normochromic acute anemia Patient's baseline hemoglobin range is 10.3-12.2 Patient does not have iron deficiency, check a B12 level.  Drop of hemoglobin probably due to volume overload and dilutional.  Patient also has some blood loss during surgery.  Patient to be diuresis today, recheck a CBC tomorrow.  Hyponatremia Sodium level better at 132.  Received IV Lasix, recheck BMP tomorrow.  Benign essential HTN Metoprolol tartrate 25 mg p.o. twice daily  Ulcerative colitis (HCC) Home sulfasalazine 500 mg p.o. twice daily  Chronic pain syndrome PDMP reviewed   Depression Resume home medicines.  Protein calorie malnutrition [facility]) Dietitian has been consulted  COPD (chronic obstructive pulmonary disease) (HCC) No bronchospasm, no exacerbation.      Subjective:  Patient has some short of breath with exertion.  Physical Exam: Vitals:   06/21/23 2127 06/22/23 0434 06/22/23 0620 06/22/23 0904  BP: (!) 164/80 (!) 157/114  (!) 162/72  Pulse: 66 65  60  Resp: 16 18  17   Temp: (!) 97 F (36.1 C) 98.2 F (36.8 C)  98.1 F (36.7 C)  TempSrc: Axillary Axillary    SpO2: 99% 97% 99% 100%  Weight:      Height:       General exam: Appears calm and comfortable  Respiratory system: Significantly decreased breath sounds. Respiratory effort normal. Cardiovascular system: S1 & S2 heard, RRR. No JVD, murmurs, rubs, gallops or clicks. No pedal edema. Gastrointestinal system: Abdomen is nondistended, soft and nontender. No organomegaly or masses felt. Normal bowel sounds heard. Central nervous system: Alert and oriented. No focal neurological deficits. Extremities: Symmetric 5 x 5 power. Skin: No rashes, lesions or ulcers Psychiatry: Judgement and insight appear normal. Mood & affect appropriate.    Data Reviewed:  Lab results reviewed.  Family Communication: Husband updated at bedside.  Disposition: Status is: Inpatient Remains inpatient appropriate because: Per general  surgery.     Time spent: 35 minutes  Author: Marrion Coy, MD 06/22/2023 1:13 PM  For on call review [email or web address].

## 2023-06-22 NOTE — TOC CM/SW Note (Signed)
Transition of Care [facility]) - Inpatient Brief Assessment   Patient Details  Name: QUYEN ANN MRN: [id number] Date of Birth: Jan 12, 1951  Transition of Care [facility]) CM/SW Contact:    Margarito Liner, LCSW Phone Number: 06/22/2023, 2:50 PM   Clinical Narrative: CSW reviewed chart. Patient is currently on acute oxygen but team is working on weaning that off. CSW will continue to follow for this potential home need. No other TOC needs identified. Please consult TOC if any needs arise.  Transition of Care Asessment: Insurance and Status: Insurance coverage has been reviewed Patient has primary care physician: Yes Home environment has been reviewed: Single family home Prior level of function:: Not documented Prior/Current Home Services: No current home services Social Determinants of Health Reivew: SDOH reviewed no interventions necessary Readmission risk has been reviewed: Yes Transition of care needs: no transition of care needs at this time

## 2023-06-22 NOTE — Progress Notes (Signed)
Initial Nutrition Assessment  DOCUMENTATION CODES:   Obesity unspecified  INTERVENTION:   -Ensure Enlive po BID, each supplement provides 350 kcal and 20 grams of protein.  -Magic cup TID with meals, each supplement provides 290 kcal and 9 grams of protein  -MVI with minerals daily   NUTRITION DIAGNOSIS:   Increased nutrient needs related to post-op healing as evidenced by estimated needs.  GOAL:   Patient will meet greater than or equal to 90% of their needs  MONITOR:   PO intake, Supplement acceptance, Diet advancement  REASON FOR ASSESSMENT:   Consult Assessment of nutrition requirement/status  ASSESSMENT:   Pt with history of hyperlipidemia, depression, anxiety, hypertension, COPD, osteoarthritis, chronic pain syndrome, CAD, former tobacco user, who was a direct admission for giant paraesophageal hernia status post paraesophageal hernia with bio mesh and partial fundoplication repair.  Pt admitted with paraesophageal hernia repair.   11/26- s/p Robotic assisted laparoscopic repair of  paraesophageal  hernia with Bio-A Mesh and Partial 320 degree fundoplication   Reviewed I/O's: +231 ml x 24 hours and +1.5 L since admission  Case discussed with nurse tech; reports that pt may leave AMA as pt is requesting to go home. Pt shares that she wants to go home, but MD wants to keep her to stay in the hospital secondary to her respiratory status.   Pt reports good appetite, however, frustrated over limitations to full liquid diet. Pt reports good appetite PTA; consumed 3 meals per day and did not consume a restricted diet at home. Noted meal completions 50%.   Pt denies any weight loss. Reviewed weight history weight has been stable over the past 4 months.   Pt increased nutritional needs for post-op healing and would benefit from addition of oral nutrition supplements.   Medications reviewed and include plavix, vitamin B-12, lovenox, and phenergan.   No results found for:  "HGBA1C" PTA DM medications are none.   Labs reviewed: Na: 132, CBGS: 172 (inpatient orders for glycemic control are ).     NUTRITION - FOCUSED PHYSICAL EXAM:  Flowsheet Row Most Recent Value  Orbital Region No depletion  Upper Arm Region No depletion  Thoracic and Lumbar Region No depletion  Buccal Region No depletion  Temple Region No depletion  Clavicle Bone Region No depletion  Clavicle and Acromion Bone Region No depletion  Scapular Bone Region No depletion  Dorsal Hand No depletion  Patellar Region No depletion  Anterior Thigh Region No depletion  Posterior Calf Region No depletion  Edema (RD Assessment) Mild  Hair Reviewed  Eyes Reviewed  Mouth Reviewed  Skin Reviewed  Nails Reviewed       Diet Order:   Diet Order             Diet full liquid Fluid consistency: Thin  Diet effective now                   EDUCATION NEEDS:   No education needs have been identified at this time  Skin:  Skin Assessment: Skin Integrity Issues: Skin Integrity Issues:: Incisions Incisions: closed abdomen  Last BM:  06/21/23  Height:   Ht Readings from Last 1 Encounters:  06/19/23 5\' 5"  (1.651 m)    Weight:   Wt Readings from Last 1 Encounters:  06/19/23 90.7 kg    Ideal Body Weight:  56.8 kg  BMI:  Body mass index is 33.28 kg/m.  Estimated Nutritional Needs:   Kcal:  1700-1900  Protein:  90-105 grams  Fluid:  >  1.7 L    Levada Schilling, RD, LDN, CDCES Registered Dietitian III Certified Diabetes Care and Education Specialist Please refer to [facility] for RD and/or RD on-call/weekend/after hours pager

## 2023-06-23 DIAGNOSIS — I5033 Acute on chronic diastolic (congestive) heart failure: Secondary | ICD-10-CM | POA: Diagnosis not present

## 2023-06-23 DIAGNOSIS — Z9889 Other specified postprocedural states: Secondary | ICD-10-CM | POA: Diagnosis not present

## 2023-06-23 DIAGNOSIS — Z8719 Personal history of other diseases of the digestive system: Secondary | ICD-10-CM

## 2023-06-23 DIAGNOSIS — J9601 Acute respiratory failure with hypoxia: Secondary | ICD-10-CM | POA: Diagnosis not present

## 2023-06-23 LAB — BASIC METABOLIC PANEL
Anion gap: 9 (ref 5–15)
BUN: 8 mg/dL (ref 8–23)
CO2: 26 mmol/L (ref 22–32)
Calcium: 9 mg/dL (ref 8.9–10.3)
Chloride: 99 mmol/L (ref 98–111)
Creatinine, Ser: 0.74 mg/dL (ref 0.44–1.00)
GFR, Estimated: >60 mL/min (ref >60)
Glucose, Bld: 113 mg/dL — ABNORMAL HIGH (ref 70–99)
Potassium: 3.8 mmol/L (ref 3.5–5.1)
Sodium: 134 mmol/L — ABNORMAL LOW (ref 135–145)

## 2023-06-23 LAB — CBC
HCT: 24.7 % — ABNORMAL LOW (ref 36.0–46.0)
Hemoglobin: 8.1 g/dL — ABNORMAL LOW (ref 12.0–15.0)
MCH: 26.7 pg (ref 26.0–34.0)
MCHC: 32.8 g/dL (ref 30.0–36.0)
MCV: 81.5 fL (ref 80.0–100.0)
Platelets: 289 10*3/uL (ref 150–400)
RBC: 3.03 MIL/uL — ABNORMAL LOW (ref 3.87–5.11)
RDW: 14.6 % (ref 11.5–15.5)
WBC: 7.2 10*3/uL (ref 4.0–10.5)
nRBC: 0 % (ref 0.0–0.2)

## 2023-06-23 LAB — MAGNESIUM: Magnesium: 1.9 mg/dL (ref 1.7–2.4)

## 2023-06-23 LAB — BRAIN NATRIURETIC PEPTIDE: B Natriuretic Peptide: 426.8 pg/mL — ABNORMAL HIGH (ref 0.0–100.0)

## 2023-06-23 MED ORDER — ALBUTEROL SULFATE (2.5 MG/3ML) 0.083% IN NEBU: 2.5000 mg | INHALATION_SOLUTION | Freq: Four times a day (QID) | RESPIRATORY_TRACT | Status: DC | PRN

## 2023-06-23 MED ORDER — OXYCODONE HCL 5 MG PO TABS: 5.0000 mg | ORAL_TABLET | Freq: Four times a day (QID) | ORAL | 0 refills | Status: DC | PRN

## 2023-06-23 MED ORDER — ENSURE ENLIVE PO LIQD: 237.0000 mL | Freq: Two times a day (BID) | ORAL | 12 refills | Status: AC

## 2023-06-23 MED ORDER — TORSEMIDE 20 MG PO TABS: 20.0000 mg | ORAL_TABLET | Freq: Every day | ORAL | 0 refills | Status: DC

## 2023-06-23 MED ORDER — FUROSEMIDE 10 MG/ML IJ SOLN: 40.0000 mg | Freq: Once | INTRAMUSCULAR | Status: DC

## 2023-06-23 MED ORDER — FUROSEMIDE 10 MG/ML IJ SOLN
40.0000 mg | Freq: Two times a day (BID) | INTRAMUSCULAR | Status: DC
  Administered 2023-06-23: 40 mg via INTRAVENOUS
  Filled 2023-06-23: qty 4

## 2023-06-23 MED ORDER — IRBESARTAN 150 MG PO TABS
150.0000 mg | ORAL_TABLET | Freq: Every day | ORAL | Status: DC
  Administered 2023-06-23: 150 mg via ORAL
  Filled 2023-06-23: qty 1

## 2023-06-23 NOTE — TOC Initial Note (Addendum)
Transition of Care [facility]) - Initial/Assessment Note    Patient Details  Name: Tammy Lamb MRN: [id number] Date of Birth: 01-Jan-1951  [facility]) CM/SW Contact:    Liliana Cline, LCSW Phone Number: 06/23/2023, 3:15 PM  Clinical Narrative:                 Met with patient and spouse at bedside. They state PT just worked with patient and feel she does not need home o2, they feel the equipment was not working correctly - PT has updated Charity fundraiser and MD - CSW asked RN and MD to clarify if o2 is needed. Patient and spouse are agreeable to Home Health rec and would like to use [facility]. Referral made to [facility] with Iantha Fallen, asked MD for orders. Spouse to transport home at DC.  4:19- RN confirmed no home o2 needed at this time.  Expected Discharge Plan: Home w Home Health Services Barriers to Discharge: Barriers Resolved   Patient Goals and CMS Choice Patient states their goals for this hospitalization and ongoing recovery are:: home with home health CMS Medicare.gov Compare Post Acute Care list provided to:: Patient Choice offered to / list presented to : Spouse, Patient      Expected Discharge Plan and Services       Living arrangements for the past 2 months: Single Family Home Expected Discharge Date: 06/23/23                         HH Arranged: PT HH Agency: [facility], [facility] Date [facility] Agency Contacted: 06/23/23   Representative spoke with at [facility] Agency: Silvio Pate  Prior Living Arrangements/Services Living arrangements for the past 2 months: Single Family Home Lives with:: Spouse Patient language and need for interpreter reviewed:: Yes Do you feel safe going back to the place where you live?: Yes          Current home services: DME Criminal Activity/Legal Involvement Pertinent to Current Situation/Hospitalization: No - Comment as needed  Activities of Daily Living   ADL Screening (condition at time of admission) Independently performs  ADLs?: Yes (appropriate for developmental age) Is the patient deaf or have difficulty hearing?: No Does the patient have difficulty seeing, even when wearing glasses/contacts?: No Does the patient have difficulty concentrating, remembering, or making decisions?: No  Permission Sought/Granted Permission sought to share information with : Facility Industrial/product designer granted to share information with : Yes, Verbal Permission Granted     Permission granted to share info w AGENCY: [facility]; DME agencies if needed        Emotional Assessment       Orientation: : Oriented to Self, Oriented to Place, Oriented to  Time, Oriented to Situation Alcohol / Substance Use: Not Applicable Psych Involvement: No (comment)  Admission diagnosis:  S/P repair of paraesophageal hernia [V78.469, Z87.19] Patient Active Problem List   Diagnosis Date Noted   Acute on chronic diastolic CHF (congestive heart failure) (HCC) 06/22/2023   COPD (chronic obstructive pulmonary disease) (HCC) 06/21/2023   Acute hypoxic respiratory failure (HCC) 06/21/2023   Elevated LFTs 06/21/2023   Protein calorie malnutrition (HCC) 06/21/2023   Depression 06/21/2023   Acute anemia 06/21/2023   Hyponatremia 06/21/2023   Hiatal hernia with gastroesophageal reflux 06/19/2023   S/P repair of paraesophageal hernia 06/19/2023   Primary osteoarthritis of right knee 03/27/2023   Chronic pain of left knee 03/27/2023   Influenza A 10/20/2022   Coronavirus infection  10/20/2022   Chest pain 10/19/2022   Coronary artery disease involving native coronary artery of native heart with unstable angina pectoris (HCC) 10/19/2022   Status post primary angioplasty with coronary stent 10/19/2022   Unstable angina (HCC) 10/18/2022   COPD with acute exacerbation (HCC) 10/18/2022   AKI (acute kidney injury) (HCC) 10/18/2022   Ulcerative colitis (HCC) 10/18/2022   Bilateral hip pain 12/26/2021   Thoracic disc herniation 08/25/2021    Radicular pain of thoracic region 08/25/2021   Atherosclerotic peripheral vascular disease with intermittent claudication (HCC) 03/08/2021   Benign essential HTN 03/08/2021   Bilateral carotid artery stenosis 03/08/2021   Hyperlipidemia, mixed 03/08/2021   Bilateral occipital neuralgia 09/16/2020   Chronic SI joint pain 09/16/2020   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   High-tone pelvic floor dysfunction 08/21/2019   Recurrent UTI 08/21/2019   S/P cervical spinal fusion (C3-C4 ACDF (03/2019) 04/29/2019   Cervicalgia 04/29/2019   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   Cervical facet joint syndrome 10/01/2018   DDD (degenerative disc disease), cervical 10/01/2018   Chronic radicular cervical pain 10/01/2018   Compression fracture of lumbar vertebra, non-traumatic, sequela 02/28/2018   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 [place] [address] Phone: [phone] Fax: [phone]  [id number] - [place], [place] - [address] AT [facility] [address] [place] [address] Phone: [phone] Fax: [phone]  [facility] [address] [place], [place] - [address] [place] [address] Phone: [phone] Fax: [phone]     Social Determinants of Health (SDOH) Social History: SDOH Screenings   Food Insecurity: No Food Insecurity (06/19/2023)  Housing: Low Risk  (06/19/2023)  Transportation Needs: No Transportation Needs (06/19/2023)  Utilities: Not At Risk  (06/19/2023)  Depression (PHQ2-9): Low Risk  (03/28/2023)  Tobacco Use: Medium Risk (06/19/2023)   SDOH Interventions:     Readmission Risk Interventions     No data to display

## 2023-06-23 NOTE — Plan of Care (Signed)
  Problem: Education: Goal: Knowledge of General Education information will improve Description: Including pain rating scale, medication(s)/side effects and non-pharmacologic comfort measures 06/23/2023 1705 by Mathis Fare, RN Outcome: Adequate for Discharge 06/23/2023 1705 by Mathis Fare, RN Outcome: Adequate for Discharge   Problem: Health Behavior/Discharge Planning: Goal: Ability to manage health-related needs will improve 06/23/2023 1705 by Mathis Fare, RN Outcome: Adequate for Discharge 06/23/2023 1705 by Mathis Fare, RN Outcome: Adequate for Discharge   Problem: Clinical Measurements: Goal: Ability to maintain clinical measurements within normal limits will improve 06/23/2023 1705 by Mathis Fare, RN Outcome: Adequate for Discharge 06/23/2023 1705 by Mathis Fare, RN Outcome: Adequate for Discharge Goal: Will remain free from infection 06/23/2023 1705 by Mathis Fare, RN Outcome: Adequate for Discharge 06/23/2023 1705 by Mathis Fare, RN Outcome: Adequate for Discharge Goal: Diagnostic test results will improve 06/23/2023 1705 by Mathis Fare, RN Outcome: Adequate for Discharge 06/23/2023 1705 by Mathis Fare, RN Outcome: Adequate for Discharge Goal: Respiratory complications will improve 06/23/2023 1705 by Mathis Fare, RN Outcome: Adequate for Discharge 06/23/2023 1705 by Mathis Fare, RN Outcome: Adequate for Discharge Goal: Cardiovascular complication will be avoided 06/23/2023 1705 by Mathis Fare, RN Outcome: Adequate for Discharge 06/23/2023 1705 by Mathis Fare, RN Outcome: Adequate for Discharge   Problem: Activity: Goal: Risk for activity intolerance will decrease 06/23/2023 1705 by Mathis Fare, RN Outcome: Adequate for Discharge 06/23/2023 1705 by Mathis Fare, RN Outcome: Adequate for Discharge   Problem: Nutrition: Goal: Adequate nutrition will be maintained 06/23/2023 1705 by  Mathis Fare, RN Outcome: Adequate for Discharge 06/23/2023 1705 by Mathis Fare, RN Outcome: Adequate for Discharge   Problem: Coping: Goal: Level of anxiety will decrease 06/23/2023 1705 by Mathis Fare, RN Outcome: Adequate for Discharge 06/23/2023 1705 by Mathis Fare, RN Outcome: Adequate for Discharge   Problem: Elimination: Goal: Will not experience complications related to bowel motility 06/23/2023 1705 by Mathis Fare, RN Outcome: Adequate for Discharge 06/23/2023 1705 by Mathis Fare, RN Outcome: Adequate for Discharge Goal: Will not experience complications related to urinary retention 06/23/2023 1705 by Mathis Fare, RN Outcome: Adequate for Discharge 06/23/2023 1705 by Mathis Fare, RN Outcome: Adequate for Discharge   Problem: Pain Management: Goal: General experience of comfort will improve 06/23/2023 1705 by Mathis Fare, RN Outcome: Adequate for Discharge 06/23/2023 1705 by Mathis Fare, RN Outcome: Adequate for Discharge   Problem: Safety: Goal: Ability to remain free from injury will improve 06/23/2023 1705 by Mathis Fare, RN Outcome: Adequate for Discharge 06/23/2023 1705 by Mathis Fare, RN Outcome: Adequate for Discharge   Problem: Skin Integrity: Goal: Risk for impaired skin integrity will decrease 06/23/2023 1705 by Mathis Fare, RN Outcome: Adequate for Discharge 06/23/2023 1705 by Mathis Fare, RN Outcome: Adequate for Discharge   Problem: Increased Nutrient Needs (NI-5.1) Goal: Food and/or nutrient delivery Description: Individualized approach for food/nutrient provision. Outcome: Adequate for Discharge   Problem: Acute Rehab PT Goals(only PT should resolve) Goal: Pt Will Ambulate Outcome: Adequate for Discharge Goal: Pt Will Go Up/Down Stairs Outcome: Adequate for Discharge

## 2023-06-23 NOTE — Progress Notes (Addendum)
SATURATION QUALIFICATIONS: (This note is used to comply with regulatory documentation for home oxygen)  Patient Saturations on Room Air at Rest = 88%  Patient Saturations on Room Air while Ambulating = 86%  Patient Saturations on 2 Liters of oxygen while Ambulating = 96%  Please briefly explain why patient needs home oxygen:  Pt intermittently requiring 2L SpO2 at rest. During ambulation, pt desats on RA to 86% and appears short of breath. SpO2 recovered to 96% SpO2 was 2L [place] was applied.    **Concern for faulty equipment. See repeat test.

## 2023-06-23 NOTE — Evaluation (Signed)
Physical Therapy Evaluation Patient Details Name: Tammy Lamb MRN: [id number] DOB: 1951-04-26 Today's Date: 06/23/2023  History of Present Illness  Ms. Jassel Frakes is a 72 year old female with history of hyperlipidemia, depression, anxiety, hypertension, COPD, osteoarthritis, chronic pain syndrome, CAD, former tobacco user, who was a direct admission for giant paraesophageal hernia status post paraesophageal hernia with bio mesh and partial fundoplication repair.  Hospitalist was consulted for acute hypoxia.  Patient was admitted on 26 November and underwent a paraesophageal hernia repair.  Clinical Impression  Pt received in bed with O2 vial [place], The telemetry box showing 79%  with Husband by her side. Pt appears comfortable and conversating. Pt has all equipments at home. Pt's spouse available and will have friends be with pt when he is at work. PT assessment revealed pt Independent with Bed mobility, transfers and bathroom use independently with FWW at RA and ambulated with FWW 100 ft at RA with CGA to Sup while maintaining SPO2 96 to 100%. Pt left in room at RA without distress. Nursing and MD made aware of it. Pt tol tx well. PT will continue in Acute and pt will benefit from HHPT to improve aerobic capacity.       If plan is discharge home, recommend the following: A little help with walking and/or transfers;A little help with bathing/dressing/bathroom;Assistance with cooking/housework;Help with stairs or ramp for entrance   Can travel by private vehicle        Equipment Recommendations None recommended by PT  Recommendations for Other Services       Functional Status Assessment Patient has had a recent decline in their functional status and demonstrates the ability to make significant improvements in function in a reasonable and predictable amount of time.     Precautions / Restrictions Precautions Precautions: None Restrictions Weight Bearing Restrictions: No      Mobility   Bed Mobility Overal bed mobility: Independent                  Transfers Overall transfer level: Independent Equipment used: Rolling walker (2 wheels)                    Ambulation/Gait Ambulation/Gait assistance: Supervision Gait Distance (Feet): 100 Feet Assistive device: Rolling walker (2 wheels) Gait Pattern/deviations: Step-through pattern, Decreased step length - right, Decreased step length - left Gait velocity: dec but steady     General Gait Details: slow and tires easily.  Stairs            Wheelchair Mobility     Tilt Bed    Modified Rankin (Stroke Patients Only)       Balance Overall balance assessment: Needs assistance Sitting-balance support: No upper extremity supported Sitting balance-Leahy Scale: Normal     Standing balance support: Bilateral upper extremity supported Standing balance-Leahy Scale: Good Standing balance comment: No LOB                             Pertinent Vitals/Pain Pain Assessment Pain Assessment: No/denies pain    Home Living Family/patient expects to be discharged to:: Private residence Living Arrangements: Spouse/significant other;Non-relatives/Friends Available Help at Discharge: Family Type of Home: House Home Access: Stairs to enter Entrance Stairs-Rails: None Entrance Stairs-Number of Steps: 2-3   Home Layout: One level Home Equipment: Agricultural consultant (2 wheels);Cane - single point;Shower seat;Wheelchair - manual;Hand held shower head;Toilet riser      Prior Function Prior Level of Function : Driving;Independent/Modified  Independent             Mobility Comments: Ind with household elvel ambulation without AD ADLs Comments: Ind     Extremity/Trunk Assessment   Upper Extremity Assessment Upper Extremity Assessment: Overall WFL for tasks assessed    Lower Extremity Assessment Lower Extremity Assessment: Generalized weakness       Communication    Communication Communication: No apparent difficulties Cueing Techniques: Verbal cues  Cognition Arousal: Alert Behavior During Therapy: WFL for tasks assessed/performed Overall Cognitive Status: Within Functional Limits for tasks assessed                                          General Comments      Exercises     Assessment/Plan    PT Assessment Patient needs continued PT services  PT Problem List Decreased strength;Decreased activity tolerance;Decreased balance;Cardiopulmonary status limiting activity       PT Treatment Interventions Gait training;Therapeutic activities;Stair training;Functional mobility training;Therapeutic exercise;Balance training;Neuromuscular re-education;Patient/family education    PT Goals (Current goals can be found in the Care Plan section)  Acute Rehab PT Goals Patient Stated Goal: " GO home and continue ot get stronger" PT Goal Formulation: With patient Time For Goal Achievement: 06/30/23 Potential to Achieve Goals: Good    Frequency Min 1X/week     Co-evaluation               AM-PAC PT "6 Clicks" Mobility  Outcome Measure Help needed turning from your back to your side while in a flat bed without using bedrails?: None Help needed moving from lying on your back to sitting on the side of a flat bed without using bedrails?: None Help needed moving to and from a bed to a chair (including a wheelchair)?: A Little Help needed standing up from a chair using your arms (e.g., wheelchair or bedside chair)?: None Help needed to walk in hospital room?: A Little Help needed climbing 3-5 steps with a railing? : A Little 6 Click Score: 21    End of Session Equipment Utilized During Treatment: Gait belt Activity Tolerance: Patient tolerated treatment well;Patient limited by fatigue Patient left: in chair;with family/visitor present;with call bell/phone within reach Nurse Communication: Mobility status PT Visit Diagnosis: Muscle  weakness (generalized) (M62.81);Difficulty in walking, not elsewhere classified (R26.2)    Time: 1444-1510 PT Time Calculation (min) (ACUTE ONLY): 26 min   Charges:   PT Evaluation $PT Eval Low Complexity: 1 Low PT Treatments $Gait Training: 8-22 mins PT General Charges $$ ACUTE PT VISIT: 1 Visit        Janet Berlin PT DPT 3:52 PM,06/23/23

## 2023-06-23 NOTE — Progress Notes (Signed)
Approximately 1710--Pt discharged to home. At time of discharge, pt A&O x 4 and VSS. All PIVs removed with sites WDL. AVS discharge instructions provided to pt and husband at beside. All questions answered at this time. All pt belongings taken with pt.

## 2023-06-23 NOTE — Discharge Planning (Signed)
Patient ambulated in [place] x3 Laps around RN station, on RA: SPO2 maintained 92% or above. If discharged today, no O2 will need to be ordered.

## 2023-06-23 NOTE — Discharge Summary (Signed)
Patient ID: Tammy Lamb MRN: [id number] DOB/AGE: 11-18-1950 72 y.o.  Admit date: 06/19/2023 Discharge date: 06/23/2023   Discharge Diagnoses:  Principal Problem:   S/P repair of paraesophageal hernia Active Problems:   Neurogenic claudication   Lumbar radiculopathy   Chronic pain syndrome   DDD (degenerative disc disease), cervical   Atherosclerotic peripheral vascular disease with intermittent claudication (HCC)   Benign essential HTN   Hyperlipidemia, mixed   Primary parkinsonism (HCC)   Ulcerative colitis (HCC)   Coronary artery disease involving native coronary artery of native heart with unstable angina pectoris (HCC)   Chronic pain of left knee   Hiatal hernia with gastroesophageal reflux   COPD (chronic obstructive pulmonary disease) (HCC)   Acute hypoxic respiratory failure (HCC)   Elevated LFTs   Protein calorie malnutrition (HCC)   Depression   Acute anemia   Hyponatremia   Acute on chronic diastolic CHF (congestive heart failure) (HCC)   Procedures:Robotic assisted paraesophageal hernia repair  Hospital Course:  Patient was admitted on 26 November and underwent a paraesophageal hernia repair.  Operatively she was started on a full liquid diet however she had an increasing oxygen requirement up to 2 L.  Worsened and increased to 4 L nasal cannula.  She did not have any tachycardia and she underwent a barium swallow that did not show any leaks.  She had a chest x-ray that did not show any pneumothorax.  The hospitalist team was consulted and she underwent diuresis as well as treatment for a COPD exacerbation.  She was tolerating a full liquid diet but continued to have a oxygen requirement.  She underwent a 6-minute walk test and was deemed appropriate for home oxygen.  This was set up and She was appropriate for discharge.  She will be discharged on a full liquid diet with home oxygen and she will follow-up with her pulmonologist   Consults: Medicine  Team  Disposition: Discharge disposition: 01-Home or Self Care       Discharge Instructions     Call MD for:  difficulty breathing, headache or visual disturbances   Complete by: As directed    Call MD for:  extreme fatigue   Complete by: As directed    Call MD for:  hives   Complete by: As directed    Call MD for:  persistant dizziness or light-headedness   Complete by: As directed    Call MD for:  persistant nausea and vomiting   Complete by: As directed    Call MD for:  redness, tenderness, or signs of infection (pain, swelling, redness, odor or green/yellow discharge around incision site)   Complete by: As directed    Call MD for:  severe uncontrolled pain   Complete by: As directed    Call MD for:  temperature >100.4   Complete by: As directed    Diet full liquid   Complete by: As directed    Discharge instructions   Complete by: As directed    You have surgical glue over your incisions.  This will peel off over 3 to 4 weeks.  You may shower; let the warm soapy water run over the incisions and pat dry.  Please do not submerge the wounds in water for 2 weeks.  No heavy lifting greater than 10 to 15 pounds for 6 weeks.  Follow-up with Dr. Elenor Legato in 2 weeks.   Increase activity slowly   Complete by: As directed       Allergies as of 06/23/2023  Reactions   Aspirin Other (See Comments)   GERD   Sulfa Antibiotics Nausea And Vomiting        Medication List     STOP taking these medications    HYDROcodone-acetaminophen 7.5-325 MG tablet Commonly known as: Norco       TAKE these medications    acetaminophen 500 MG tablet Commonly known as: TYLENOL Take 1,000 mg by mouth every 6 (six) hours as needed for moderate pain or headache.   albuterol 108 (90 Base) MCG/ACT inhaler Commonly known as: VENTOLIN HFA Inhale 2 puffs into the lungs every 6 (six) hours as needed for wheezing or shortness of breath.   ascorbic acid 1000 MG tablet Commonly known as:  VITAMIN C Take 1,000 mg by mouth once a week.   aspirin EC 81 MG tablet Take 1 tablet (81 mg total) by mouth daily. Swallow whole.   atorvastatin 80 MG tablet Commonly known as: LIPITOR Take 80 mg by mouth at bedtime.   Breztri Aerosphere 160-9-4.8 MCG/ACT Aero Generic drug: Budeson-Glycopyrrol-Formoterol Inhale 2 puffs into the lungs in the morning and at bedtime.   cephALEXin 250 MG capsule Commonly known as: KEFLEX Take 250 mg by mouth daily.   cholecalciferol 25 MCG (1000 UNIT) tablet Commonly known as: VITAMIN D3 Take 1,000 Units by mouth at bedtime.   Cinnamon 500 MG Tabs Take 500 mg by mouth in the morning and at bedtime.   citalopram 20 MG tablet Commonly known as: CELEXA Take 20 mg by mouth every morning.   clopidogrel 75 MG tablet Commonly known as: PLAVIX Take 1 tablet (75 mg total) by mouth daily with breakfast.   cyanocobalamin 1000 MCG tablet Commonly known as: VITAMIN B12 Take 1,000 mcg by mouth daily.   DULoxetine 60 MG capsule Commonly known as: CYMBALTA Take 60 mg by mouth daily.   feeding supplement Liqd Take 237 mLs by mouth 2 (two) times daily between meals. Start taking on: June 24, 2023   Fish Oil 1200 MG Caps Take 1,200 mg by mouth once a week.   gabapentin 300 MG capsule Commonly known as: Neurontin Take 3 capsules (900 mg total) by mouth 2 (two) times daily.   HAIR SKIN NAILS PO Take 1 tablet by mouth in the morning and at bedtime.   hydrOXYzine 25 MG tablet Commonly known as: ATARAX Take 25 mg by mouth at bedtime.   metoprolol tartrate 25 MG tablet Commonly known as: LOPRESSOR Take 1 tablet (25 mg total) by mouth 2 (two) times daily.   nitroGLYCERIN 0.4 MG SL tablet Commonly known as: NITROSTAT Place 1 tablet (0.4 mg total) under the tongue every 5 (five) minutes as needed for chest pain.   oxybutynin 10 MG 24 hr tablet Commonly known as: DITROPAN-XL Take 10 mg by mouth at bedtime.   oxyCODONE 5 MG immediate release  tablet Commonly known as: Oxy IR/ROXICODONE Take 1 tablet (5 mg total) by mouth every 6 (six) hours as needed for moderate pain (pain score 4-6).   pantoprazole 40 MG tablet Commonly known as: PROTONIX Take 1 tablet (40 mg total) by mouth daily.   pentosan polysulfate 100 MG capsule Commonly known as: ELMIRON Take 100 mg by mouth 2 (two) times daily.   sulfaSALAzine 500 MG tablet Commonly known as: AZULFIDINE Take 500 mg by mouth 2 (two) times daily.   tiZANidine 4 MG tablet Commonly known as: ZANAFLEX Take 1 tablet (4 mg total) by mouth every 12 (twelve) hours as needed.   TURMERIC CURCUMIN PO Take  1 capsule by mouth daily.   valsartan 160 MG tablet Commonly known as: DIOVAN Take 160 mg by mouth daily.               Durable Medical Equipment  (From admission, onward)           Start     Ordered   06/23/23 1227  For home use only DME oxygen  Once       Question Answer Comment  Length of Need 6 Months   Mode or (Route) Nasal cannula   Liters per Minute 2   Oxygen delivery system Gas      06/23/23 1226            Follow-up Information     Pabon, Merri Ray, MD. Go on 07/04/2023.   Specialty: General Surgery Why: Go to appointment on 12/11 at 230 PM Contact information: [address] [place] [address] [phone]                  Baker Pierini, M.D.

## 2023-06-23 NOTE — Progress Notes (Signed)
Progress Note   Patient: Tammy Lamb:[id number] DOB: April 10, 1951 DOA: 06/19/2023     3 DOS: the patient was seen and examined on 06/23/2023   Brief hospital course: [name] is a 72 year old female with history of hyperlipidemia, depression, anxiety, hypertension, COPD, osteoarthritis, chronic pain syndrome, CAD, former tobacco user, who was a direct admission for giant paraesophageal hernia status post paraesophageal hernia with bio mesh and partial fundoplication repair. Hospitalist was consulted for acute hypoxia.   Principal Problem:   S/P repair of paraesophageal hernia Active Problems:   Acute hypoxic respiratory failure (HCC)   Benign essential HTN   Ulcerative colitis (HCC)   Chronic pain syndrome   Primary parkinsonism (HCC)   Neurogenic claudication   Lumbar radiculopathy   DDD (degenerative disc disease), cervical   Atherosclerotic peripheral vascular disease with intermittent claudication (HCC)   Hyperlipidemia, mixed   Coronary artery disease involving native coronary artery of native heart with unstable angina pectoris (HCC)   Chronic pain of left knee   Hiatal hernia with gastroesophageal reflux   COPD (chronic obstructive pulmonary disease) (HCC)   Elevated LFTs   Protein calorie malnutrition (HCC)   Depression   Acute anemia   Hyponatremia   Acute on chronic diastolic CHF (congestive heart failure) (HCC)   Assessment and Plan: * S/P repair of paraesophageal hernia Per primary team   Acute hypoxic respiratory failure (HCC) Acute on chronic diastolic congestive heart failure. Elevated troponin secondary to demand ischemia. Reviewed patient echocardiogram performed 09/2022, ejection fraction 60 to 65%, diastolic dysfunction. Patient received a fluid resuscitation during the surgery, she developed volume overload.  BNP was moderately elevated.  Patient was given 1 dose IV Lasix 11/29, she feels better.  However, she still require more than 2 L  oxygen, BNP still moderately elevated, I will start Lasix 40 mg IV twice a day.   Acute on chronic anemia Acute blood loss anemia. Patient's baseline hemoglobin range is 10.3-12.2 Patient does not have iron deficiency, B12 level elevated.  Drop of hemoglobin probably due to volume overload and dilutional.  Patient also has some blood loss during surgery.  Hb is better after diuresis, recheck a CBC tomorrow.  Hyponatremia Sodium level improving after Lasix.   Benign essential HTN Metoprolol tartrate 25 mg p.o. twice daily   Ulcerative colitis (HCC) Home sulfasalazine 500 mg p.o. twice daily   Chronic pain syndrome PDMP reviewed     Depression Resume home medicines.   Moderate protein calorie malnutrition (HCC) Obesity BMI 33.28. Dietitian has been consulted   COPD (chronic obstructive pulmonary disease) (HCC) No bronchospasm, no exacerbation.        Subjective:  Feel better today, less short of breath.  However, still required 2 L oxygen.  Physical Exam: Vitals:   06/22/23 2110 06/23/23 0449 06/23/23 0726 06/23/23 0807  BP:  (!) 164/92  (!) 183/78  Pulse:  70  73  Resp:  18  18  Temp:  99.3 F (37.4 C)  98.2 F (36.8 C)  TempSrc:      SpO2: 94% 95% 94% 92%  Weight:      Height:       General exam: Appears calm and comfortable, obese. Respiratory system: Significant decreased breathing sounds. Respiratory effort normal. Cardiovascular system: S1 & S2 heard, RRR. No JVD, murmurs, rubs, gallops or clicks. No pedal edema. Gastrointestinal system: Abdomen is nondistended, soft and nontender. No organomegaly or masses felt. Normal bowel sounds heard. Central nervous system: Alert and oriented. No focal  neurological deficits. Extremities: Symmetric 5 x 5 power. Skin: No rashes, lesions or ulcers Psychiatry: Judgement and insight appear normal. Mood & affect appropriate.    Data Reviewed:  Lab results reviewed.  Family Communication:  None  Disposition: Status is: Inpatient Remains inpatient appropriate because: Severity of disease, IV treatment.     Time spent: 35 minutes  Author: Marrion Coy, MD 06/23/2023 10:49 AM  For on call review [email or web address].

## 2023-06-25 ENCOUNTER — Encounter: Payer: Self-pay | Admitting: Student in an Organized Health Care Education/Training Program

## 2023-06-25 NOTE — Anesthesia Postprocedure Evaluation (Signed)
Anesthesia Post Note  Patient: Tammy Lamb  Procedure(s) Performed: XI ROBOTIC ASSISTED PARAESOPHAGEAL HERNIA REPAIR, RNFA to assist INSERTION OF MESH (Thoracic)  Patient location during evaluation: PACU Anesthesia Type: General Level of consciousness: awake and alert Pain management: pain level controlled Vital Signs Assessment: post-procedure vital signs reviewed and stable Respiratory status: spontaneous breathing, nonlabored ventilation, respiratory function stable and patient connected to nasal cannula oxygen Cardiovascular status: blood pressure returned to baseline and stable Postop Assessment: no apparent nausea or vomiting Anesthetic complications: no   No notable events documented.   Last Vitals:  Vitals:   06/23/23 0807 06/23/23 1602  BP: (!) 183/78 (!) 167/89  Pulse: 73 66  Resp: 18 18  Temp: 36.8 C 36.9 C  SpO2: 92% 96%    Last Pain:  Vitals:   06/23/23 1602  TempSrc:   PainSc: 0-No pain                 Lenard Simmer

## 2023-06-26 ENCOUNTER — Ambulatory Visit
Payer: Medicare Other | Attending: Student in an Organized Health Care Education/Training Program | Admitting: Student in an Organized Health Care Education/Training Program

## 2023-06-26 ENCOUNTER — Encounter: Payer: Medicare Other | Admitting: Student in an Organized Health Care Education/Training Program

## 2023-06-26 DIAGNOSIS — M1711 Unilateral primary osteoarthritis, right knee: Secondary | ICD-10-CM

## 2023-06-26 DIAGNOSIS — M25562 Pain in left knee: Secondary | ICD-10-CM

## 2023-06-26 DIAGNOSIS — M5124 Other intervertebral disc displacement, thoracic region: Secondary | ICD-10-CM

## 2023-06-26 DIAGNOSIS — M170 Bilateral primary osteoarthritis of knee: Secondary | ICD-10-CM

## 2023-06-26 DIAGNOSIS — M5030 Other cervical disc degeneration, unspecified cervical region: Secondary | ICD-10-CM

## 2023-06-26 DIAGNOSIS — G8929 Other chronic pain: Secondary | ICD-10-CM

## 2023-06-26 DIAGNOSIS — M1712 Unilateral primary osteoarthritis, left knee: Secondary | ICD-10-CM

## 2023-06-26 DIAGNOSIS — G894 Chronic pain syndrome: Secondary | ICD-10-CM

## 2023-06-26 DIAGNOSIS — M5414 Radiculopathy, thoracic region: Secondary | ICD-10-CM

## 2023-06-26 MED ORDER — HYDROCODONE-ACETAMINOPHEN 7.5-325 MG PO TABS: 1.0000 | ORAL_TABLET | Freq: Three times a day (TID) | ORAL | 0 refills | Status: DC | PRN

## 2023-06-26 NOTE — Progress Notes (Signed)
Patient: Tammy Lamb  Service Category: E/M  Provider: Edward Jolly, MD  DOB: [birth year]  DOS: 06/26/2023  Location: Office  MRN: [id number]  Setting: Ambulatory outpatient  Referring Provider: Enid Baas, MD  Type: Established Patient  Specialty: Interventional Pain Management  PCP: Enid Baas, MD  Location: Remote location  Delivery: TeleHealth     Virtual Encounter - Pain Management PROVIDER NOTE: Information contained herein reflects review and annotations entered in association with encounter. Interpretation of such information and data should be left to medically-trained personnel. Information provided to patient can be located elsewhere in the medical record under "Patient Instructions". Document created using STT-dictation technology, any transcriptional errors that may result from process are unintentional.    Contact & Pharmacy Preferred: [phone] Home: [phone] (home) Mobile: [phone] (mobile) E-mail: [email or web address]  [facility] - [place] - [address] [place] [address] Phone: [phone] Fax: [phone]  [id number] - [place], [place] - [address] AT [facility] [address] [place] [address] Phone: [phone] Fax: [phone]  [facility] [address] [place], [place] - [address] [place] [address] Phone: [phone] Fax: [phone]   Pre-screening  Ms. Trunnell offered "in-person" vs "virtual" encounter. She indicated preferring virtual for this encounter.   Reason COVID-19*  Social distancing based on CDC and AMA recommendations.   I contacted Tammy Lamb on 06/26/2023 via telephone.      I clearly identified myself as Edward Jolly, MD. I verified that I was speaking with the correct person using two identifiers (Name: Tammy Lamb, and date of birth: Feb 18, 1951).  Consent I sought verbal advanced consent from  Tammy Lamb for virtual visit interactions. I informed Tammy Lamb of possible security and privacy concerns, risks, and limitations associated with providing "not-in-person" medical evaluation and management services. I also informed Tammy Lamb of the availability of "in-person" appointments. Finally, I informed her that there would be a charge for the virtual visit and that she could be  personally, fully or partially, financially responsible for it. Tammy Lamb expressed understanding and agreed to proceed.   Historic Elements   Tammy Lamb is a 72 y.o. year old, female patient evaluated today after our last contact on 05/16/2023. Tammy Lamb  has a past medical history of Anemia, Anxiety, Aortic atherosclerosis (HCC), Arthritis, Bilateral carotid artery stenosis, CAD (coronary artery disease), native coronary artery (10/05/2022), Cerebral microvascular disease, Chronic lower back pain, Colitis, ulcerative (HCC), COPD (chronic obstructive pulmonary disease) (HCC), DDD (degenerative disc disease), cervical, Depression, Diastolic dysfunction (10/19/2022), Dyspnea, GERD (gastroesophageal reflux disease), Hiatal hernia, History of 2019 novel coronavirus disease (COVID-19) (02/2021), HLD (hyperlipidemia), Hypertension, Interstitial cystitis, Long-term use of aspirin therapy, Lumbar disc disease, Motion sickness, On chronic clopidogrel therapy, Paraesophageal hernia, Parkinson's disease (HCC), Pulmonary hypertension (HCC) (04/18/2021), PVD (peripheral vascular disease) (HCC), T2DM (type 2 diabetes mellitus) (HCC), TIA (transient ischemic attack) (2015), Urinary frequency, and Wears dentures. She also  has a past surgical history that includes Tonsillectomy; Cholecystectomy; Hysterectomy abdominal with salpingo-oophorectomy; Diagnostic laparoscopy; Bladder suspension; Colonoscopy; Knee arthroscopy (Left, 02/13/2014); Ankle arthroscopy (Left, 03/22/2016); Tendon repair (Left, 03/22/2016); Lumbar  laminectomy/decompression microdiscectomy (N/A, 05/30/2017); Anterior cervical decomp/discectomy fusion (N/A, 04/09/2019); Lumbar laminectomy/decompression microdiscectomy (N/A, 05/27/2021); LEFT HEART CATH AND CORONARY ANGIOGRAPHY (N/A, 10/18/2022); CORONARY STENT INTERVENTION (N/A, 10/18/2022); Esophagogastroduodenoscopy (egd) with propofol (N/A, 06/06/2023); Xi robotic assisted paraesophageal hernia repair (N/A, 06/19/2023); and Insertion of mesh (06/19/2023). Tammy Lamb  has a current medication list which includes the following prescription(s): acetaminophen, albuterol, ascorbic acid, aspirin ec, atorvastatin, breztri aerosphere, cephalexin, cholecalciferol, cinnamon, citalopram, clopidogrel, feeding supplement, [START ON 07/02/2023] hydrocodone-acetaminophen, hydroxyzine, metoprolol tartrate, multiple vitamins-minerals, fish oil, oxybutynin, pantoprazole, pentosan polysulfate, sulfasalazine, torsemide, turmeric, valsartan, cyanocobalamin, duloxetine, gabapentin, nitroglycerin, and tizanidine, and the following Facility-Administered Medications: albuterol. She  reports that she quit smoking about 9 months ago. Her smoking use included cigarettes. She started smoking about 56 years ago. She has a 56.2 pack-year smoking history. She has been exposed to tobacco smoke. She has never used smokeless tobacco. She reports that she does not currently use alcohol. She reports that she does not use drugs. Tammy Lamb is allergic to aspirin and sulfa antibiotics.  BMI: Estimated body mass index is 33.28 kg/m as calculated from the following:   Height as of 06/19/23: 5\' 5"  (1.651 m).   Weight as of 06/19/23: 200 lb (90.7 kg). Last encounter: 03/27/2023. Last procedure: 03/28/2023.  HPI  Today, she is being contacted for both, medication management and a post-procedure assessment.  Discussed the use of AI scribe software for clinical note transcription with the patient, who gave verbal consent to proceed.  History of  Present Illness   Tammy Lamb, a patient with a history of chronic pain, recently underwent a steroid injection in the left knee at our pain clinic.  She endorses benefit with her left knee steroid injection.   The patient also had a recent hernia surgery, which has temporarily limited her ability to attend in-person appointments.  The patient has been on a regimen of hydrocodone for pain management. At the time of the call, she reported having 23 tablets left given that she was prescribed oxycodone postoperatively at the hospital, with a usage rate of approximately three tablets per day. This indicates a remaining supply of about seven days.  There were no additional symptoms or complications reported during the conversation. The patient's next face-to-face appointment is scheduled for early January.       Post-procedure evaluation   Type:  Steroid Intra-articular Knee Injection #1  Laterality: Left (-LT) Level/approach: Lateral Imaging guidance: None required ([id number]) Anesthesia: Local anesthesia (1-2% Lidocaine) DOS: 03/28/2023  Performed by: Edward Jolly, MD  Purpose: Diagnostic/Therapeutic Indications: Knee arthralgia associated to osteoarthritis of the knee 1. Chronic pain of left knee    NAS-11 score:   Pre-procedure: 8 /10   Post-procedure: 8 /10     Effectiveness:  Initial hour after procedure: 10 %  Subsequent 4-6 hours post-procedure: 10 %  Analgesia past initial 6 hours: 90 %  Ongoing improvement:  Analgesic:  90% Function: Transient improvement ROM: Tammy Lamb reports improvement in ROM   Pharmacotherapy Assessment   Opioid Analgesic: Hydrocodone 7.5mg  TID prn 90/month     Monitoring: [place] PMP: PDMP reviewed during this encounter.       Pharmacotherapy: No side-effects or adverse reactions reported. Compliance: No problems identified. Effectiveness: Clinically acceptable. Plan: Refer to "POC". UDS:  Summary  Date Value Ref Range Status  09/28/2022 Note   Final    Comment:    ==================================================================== [employer] Select 13 (MW) ==================================================================== Test                             Result       Flag       Units  Drug Present and Declared for Prescription Verification   Hydrocodone  280          EXPECTED   ng/mg creat   Norhydrocodone                 979          EXPECTED   ng/mg creat    Sources of hydrocodone include scheduled prescription medications.    Norhydrocodone is an expected metabolite of hydrocodone.  ==================================================================== Test                      Result    Flag   Units      Ref Range   Creatinine              61               mg/dL      >=40 ==================================================================== Declared Medications:  The flagging and interpretation on this report are based on the  following declared medications.  Unexpected results may arise from  inaccuracies in the declared medications.   **Note: The testing scope of this panel includes these medications:   Hydrocodone (Norco)   **Note: The testing scope of this panel does not include the  following reported medications:   Acetaminophen (Tylenol)  Acetaminophen (Norco)  Albuterol (Ventolin HFA)  Amlodipine (Norvasc)  Atorvastatin (Lipitor)  Cephalexin (Keflex)  Cinnamon  Citalopram (Celexa)  Clonidine (Catapres)  Duloxetine (Cymbalta)  Fish Oil  Fluticasone (Advair)  Gabapentin (Neurontin)  Hydrochlorothiazide (Hydrodiuril)  Hydroxyzine (Atarax)  Magnesium (Mag-Ox)  Omeprazole  Oxybutynin (Ditropan)  Ramipril (Altace)  Salmeterol (Advair)  Sodium Bicarbonate  Sulfasalazine (Azulfidine)  Tizanidine (Zanaflex)  Valsartan (Diovan)  Vitamin B12  Vitamin C  Vitamin D3 ==================================================================== For clinical consultation, please call [phone]. ====================================================================    No results found for: "CBDTHCR", "D8THCCBX", "D9THCCBX"   Laboratory Chemistry Profile   Renal Lab Results  Component Value Date   BUN 8 06/23/2023   CREATININE 0.74 06/23/2023   GFRAA >60 04/02/2019   GFRNONAA >60 06/23/2023    Hepatic Lab Results  Component Value Date   AST 61 (H) 06/22/2023   ALT 40 06/22/2023   ALBUMIN 3.1 (L) 06/22/2023   ALKPHOS 36 (L) 06/22/2023    Electrolytes Lab Results  Component Value Date   NA 134 (L) 06/23/2023   K 3.8 06/23/2023   CL 99 06/23/2023   CALCIUM 9.0 06/23/2023   MG 1.9 06/23/2023    Bone No results found for: "VD25OH", "VD125OH2TOT", "NW2956OZ3", "YQ6578IO9", "25OHVITD1", "25OHVITD2", "25OHVITD3", "TESTOFREE", "TESTOSTERONE"  Inflammation (CRP: Acute Phase) (ESR: Chronic Phase) No results found for: "CRP", "ESRSEDRATE", "LATICACIDVEN"       Note: Above Lab results reviewed.  Imaging  CT Angio Chest Pulmonary Embolism (PE) W or WO Contrast CLINICAL DATA:  Shortness of breath, status post repair paraesophageal hernia  EXAM: CT ANGIOGRAPHY CHEST WITH CONTRAST  TECHNIQUE: Multidetector CT imaging of the chest was performed using the standard protocol during bolus administration of intravenous contrast. Multiplanar CT image reconstructions and MIPs were obtained to evaluate the vascular anatomy.  RADIATION DOSE REDUCTION: This exam was performed according to the departmental dose-optimization program which includes automated exposure control, adjustment of the mA and/or kV according to patient size and/or use of iterative reconstruction technique.  CONTRAST:  75mL OMNIPAQUE IOHEXOL 350 MG/ML SOLN  COMPARISON:  UGI 06/20/2023, PET CT 05/09/2023, chest CT 04/23/2023  FINDINGS: Cardiovascular: Satisfactory opacification of the pulmonary arteries to the segmental level. No evidence of pulmonary embolism. Borderline cardiomegaly. No  pericardial effusion.  Mild atherosclerosis. Coronary vascular calcification.  Mediastinum/Nodes: Small foci of gas within the mediastinum consistent with recent hernia repair. Large fluid collection within the lower mediastinum. Focal gas and fluid collection in the lower mediastinum on series 4, image 70, poorly defined and measuring about 4.9 cm. Moderate air and some fluid distension of the esophagus with edematous appearance at the level of the distal mediastinum.  Lungs/Pleura: Small moderate bilateral pleural effusions. Lower lobe and lingular consolidations. Emphysema. No pneumothorax  Upper Abdomen: Edema at the GE junction region presumably corresponding to history of partial fundoplication.  Musculoskeletal: Scattered foci of gas in the chest wall soft tissues likely postoperative. No acute osseous abnormality  Review of the MIP images confirms the above findings.  IMPRESSION: 1. Negative for acute pulmonary embolus. 2. Small to moderate bilateral pleural effusions with lower lobe and lingular consolidations, this may be due to atelectasis, pneumonia or aspiration. 3. Postoperative changes of recent paraesophageal hernia repair and partial fundoplication. Fair amount of gas and fluid within the lower mediastinum, probably representing postoperative gas and fluid though technically leak cannot be excluded, but none seen on postoperative UGI yesterday. Moderate air and fluid distension of the esophagus with suspected edema at the distal esophagus and GE junction, likely postoperative. 4. Emphysema. 5. Aortic atherosclerosis.  Aortic Atherosclerosis (ICD10-I70.0) and Emphysema (ICD10-J43.9).  Electronically Signed   By: Jasmine Pang M.D.   On: 06/21/2023 21:42 DG Chest 2 View CLINICAL DATA:  Shortness of breath  EXAM: CHEST - 2 VIEW  COMPARISON:  Chest x-ray 06/19/2023.  FINDINGS: Enlarged cardiopericardial silhouette. Tortuous ectatic aorta. Prominent  interstitial changes. Decreasing chest wall gas. Increasing left lung base opacity. No pneumothorax. Small pleural effusions. Osteopenia and degenerative changes.  IMPRESSION: Underinflation with small pleural effusions.  Increasing left lung base opacity.  Enlarged cardiopericardial silhouette with stable widening of the mediastinum.  Decreasing chest wall gas.  Electronically Signed   By: Karen Kays M.D.   On: 06/21/2023 13:50  Assessment  The primary encounter diagnosis was Chronic pain of left knee. Diagnoses of Primary osteoarthritis of left knee, Primary osteoarthritis of right knee, Thoracic disc herniation, DDD (degenerative disc disease), cervical, Radicular pain of thoracic region, and Chronic pain syndrome were also pertinent to this visit.  Plan of Care  Assessment and Plan    Left Knee Pain   Good relief, repeat PRN  Refill of Hydrocodone as below, no change in dose       Tammy Lamb has a current medication list which includes the following long-term medication(s): albuterol, atorvastatin, metoprolol tartrate, pantoprazole, sulfasalazine, torsemide, valsartan, duloxetine, gabapentin, nitroglycerin, and tizanidine.  Pharmacotherapy (Medications Ordered): Meds ordered this encounter  Medications   HYDROcodone-acetaminophen (NORCO) 7.5-325 MG tablet    Sig: Take 1 tablet by mouth every 8 (eight) hours as needed for moderate pain (pain score 4-6).    Dispense:  90 tablet    Refill:  0   Orders:  No orders of the defined types were placed in this encounter.  Follow-up plan:   Return in about 5 weeks (around 08/02/2023) for MM, F2F.      Recent Visits Date Type Provider Dept  03/28/23 Procedure visit Edward Jolly, MD [facility]  Showing recent visits within past 90 days and meeting all other requirements Today's Visits Date Type Provider Dept  06/26/23 Office Visit Edward Jolly, MD [facility]  Showing today's visits and  meeting all other requirements Future Appointments No visits were found meeting these conditions. Showing  future appointments within next 90 days and meeting all other requirements  I discussed the assessment and treatment plan with the patient. The patient was provided an opportunity to ask questions and all were answered. The patient agreed with the plan and demonstrated an understanding of the instructions.  Patient advised to call back or seek an in-person evaluation if the symptoms or condition worsens.  Duration of encounter: .  Note by: Edward Jolly, MD Date: 06/26/2023; Time: 2:54 PM

## 2023-06-28 ENCOUNTER — Encounter: Payer: Self-pay | Admitting: Surgery

## 2023-07-04 ENCOUNTER — Encounter: Payer: Self-pay | Admitting: Surgery

## 2023-07-04 ENCOUNTER — Ambulatory Visit (INDEPENDENT_AMBULATORY_CARE_PROVIDER_SITE_OTHER): Payer: Medicare Other | Admitting: Surgery

## 2023-07-04 VITALS — BP 125/81 | HR 75 | Temp 98.5°F | Ht 65.0 in | Wt 191.2 lb

## 2023-07-04 DIAGNOSIS — Z09 Encounter for follow-up examination after completed treatment for conditions other than malignant neoplasm: Secondary | ICD-10-CM

## 2023-07-04 DIAGNOSIS — K449 Diaphragmatic hernia without obstruction or gangrene: Secondary | ICD-10-CM

## 2023-07-04 NOTE — Patient Instructions (Signed)
Please schedule an appointment with Dr. Nemiah Commander asap to discuss the fluid in your legs.   Laparoscopic Nissen Fundoplication, Care After The following information offers guidance on how to care for yourself after your procedure. Your health care provider may also give you more specific instructions. If you have problems or questions, contact your health care provider. What can I expect after the procedure? After the procedure, it is common to have: Trouble swallowing (dysphagia). Discomfort when you swallow. Soreness in your abdomen. Bloating. Follow these instructions at home: Medicines Take over-the-counter and prescription medicines only as told by your health care provider. Ask your health care provider or pharmacist if you can crush any pill that you are taking. Take only liquid medicines as told. Ask your health care provider if the medicine prescribed to you requires you to avoid driving or using machinery. Incision care  Follow instructions from your health care provider about how to take care of your incisions. Make sure you: Wash your hands with soap and water for at least 20 seconds before and after you change your bandage (dressing). If soap and water are not available, use hand sanitizer. Change your dressing as told by your health care provider. Leave stitches (sutures), skin glue, or adhesive strips in place. These skin closures may need to stay in place for 2 weeks or longer. If adhesive strip edges start to loosen and curl up, you may trim the loose edges. Do not remove adhesive strips completely unless your health care provider tells you to do that. Check your incision area every day for signs of infection. Check for: Redness, swelling, or pain. Fluid or blood. Warmth. Pus or a bad smell. Eating and drinking  Follow instructions from your health care provider about eating or drinking restrictions. Follow these instructions carefully. You may need to follow a  liquid-only diet for 2 weeks, followed by a diet of soft foods for 2 weeks. You should eat slow, take small bites, and chew food carefully. You should eat or drink in an upright position. You should return to your usual diet gradually. Drink enough fluid to keep your urine pale yellow. Activity  If you were given a sedative during the procedure, it can affect you for several hours. Do not drive or operate machinery until your health care provider says that it is safe. Rest as told by your health care provider. Avoid sitting for a long time without moving. Get up to take short walks every 1-2 hours. This is important to improve blood flow and breathing. Do not lift anything that is heavier than 10 lb (4.5 kg), or the limit that you are told, until your health care provider says that it is safe. Avoid activities that take a lot of effort. Ask your health care provider what activities are safe for you. Ask when you can: Return to sexual activity. Drive. Go back to work. Return to your normal activities as told by your health care provider. General instructions Do not take baths, swim, or use a hot tub until your health care provider approves. Ask your health care provider if you may take showers. You may only be allowed to take sponge baths. Do not use any products that contain nicotine or tobacco. These products include cigarettes, chewing tobacco, and vaping devices, such as e-cigarettes. These can delay healing. If you need help quitting, ask your health care provider. Keep all follow-up visits. This is important. Contact a health care provider if: You have chills or a fever.  You have trouble swallowing. You have painful bloating. You have persistent heartburn. You have pain that does not go away with medicine. You have frequent nausea or vomiting. Your incision opens up. You have any of these signs of infection: Redness, swelling, or pain around your incision. Fluid or blood coming  from your incision. Warmth coming from your incision. Pus or a bad smell coming from your incision. A fever. Get help right away if: You have severe pain or severe bloating. You are unable to swallow. You have vomiting that does not stop. You have blood in your vomit. You have trouble breathing. These symptoms may represent a serious problem that is an emergency. Do not wait to see if the symptoms will go away. Get medical help right away. Call your local emergency services (911 in the [place] Do not drive yourself to the hospital. Summary After the procedure, it is common to have trouble swallowing or have discomfort when you swallow. Follow instructions from your health care provider about eating or drinking restrictions. You may need to follow a liquid-only diet for 2 weeks, followed by a diet of soft foods for 2 weeks. Return to your normal activities as told by your health care provider. Keep all follow-up visits as told by your health care provider. This is important. This information is not intended to replace advice given to you by your health care provider. Make sure you discuss any questions you have with your health care provider. Document Revised: 01/25/2020 Document Reviewed: 01/25/2020 Elsevier Patient Education  2024 [employer].

## 2023-07-05 ENCOUNTER — Encounter: Payer: Self-pay | Admitting: Surgery

## 2023-07-10 ENCOUNTER — Other Ambulatory Visit: Payer: Self-pay | Admitting: Internal Medicine

## 2023-07-10 DIAGNOSIS — M7989 Other specified soft tissue disorders: Secondary | ICD-10-CM

## 2023-07-12 ENCOUNTER — Ambulatory Visit
Admission: RE | Admit: 2023-07-12 | Discharge: 2023-07-12 | Disposition: A | Discharge status: Home / Self Care | Payer: Medicare Other | Source: Ambulatory Visit | Attending: Internal Medicine | Admitting: Internal Medicine

## 2023-07-12 DIAGNOSIS — M7989 Other specified soft tissue disorders: Secondary | ICD-10-CM | POA: Insufficient documentation

## 2023-07-20 ENCOUNTER — Telehealth: Payer: Self-pay

## 2023-07-20 NOTE — Telephone Encounter (Signed)
Breztri inhaler delivered to our office from pharmacy services. Patient has been notified. She will pick-up on Monday 07/23/2023. Nothing further needed.

## 2023-07-20 NOTE — Telephone Encounter (Signed)
Patient called and is post op Nissen Fundoplication Sx on 06/19/23. She reports stomach pains with eating yesterday and today. She reports that she ate a large amount of food on Christmas day, but no breads. Able to drink fluids without issues. She does report some dry heaving after she eats but no vomiting. No complaints of fever or chills and all surgery sites are pink in color with no signs of infection. Patient instructed to go on a full liquid diet for the next two days to rest the stomach. She is aware to call back if her symptoms do not improve or worsen or to report to the ED. She will follow up with Dr Everlene Farrier on Monday.

## 2023-07-23 ENCOUNTER — Other Ambulatory Visit: Payer: Self-pay | Admitting: *Deleted

## 2023-07-23 ENCOUNTER — Ambulatory Visit
Admission: RE | Admit: 2023-07-23 | Discharge: 2023-07-23 | Disposition: A | Discharge status: Home / Self Care | Payer: Medicare Other | Source: Ambulatory Visit | Attending: Surgery | Admitting: Surgery

## 2023-07-23 ENCOUNTER — Ambulatory Visit (INDEPENDENT_AMBULATORY_CARE_PROVIDER_SITE_OTHER): Payer: Medicare Other | Admitting: Surgery

## 2023-07-23 ENCOUNTER — Encounter: Payer: Self-pay | Admitting: Surgery

## 2023-07-23 VITALS — BP 135/87 | HR 68 | Temp 98.4°F | Ht 66.0 in | Wt 187.4 lb

## 2023-07-23 DIAGNOSIS — R109 Unspecified abdominal pain: Secondary | ICD-10-CM

## 2023-07-23 DIAGNOSIS — R1084 Generalized abdominal pain: Secondary | ICD-10-CM

## 2023-07-23 DIAGNOSIS — G8918 Other acute postprocedural pain: Secondary | ICD-10-CM

## 2023-07-23 DIAGNOSIS — K449 Diaphragmatic hernia without obstruction or gangrene: Secondary | ICD-10-CM

## 2023-07-23 DIAGNOSIS — Z09 Encounter for follow-up examination after completed treatment for conditions other than malignant neoplasm: Secondary | ICD-10-CM

## 2023-07-23 DIAGNOSIS — K219 Gastro-esophageal reflux disease without esophagitis: Secondary | ICD-10-CM

## 2023-07-23 MED ORDER — IOHEXOL 300 MG/ML  SOLN
100.0000 mL | Freq: Once | INTRAMUSCULAR | Status: AC | PRN
  Administered 2023-07-23: 100 mL via INTRAVENOUS

## 2023-07-23 MED ORDER — PANTOPRAZOLE SODIUM 40 MG PO TBEC: 40.0000 mg | DELAYED_RELEASE_TABLET | Freq: Every day | ORAL | 0 refills | Status: DC

## 2023-07-23 NOTE — Patient Instructions (Addendum)
We will get you scheduled for a CT scan of you abdomen. We will call you about this.   GENERAL POST-OPERATIVE PATIENT INSTRUCTIONS   WOUND CARE INSTRUCTIONS:  Keep a dry clean dressing on the wound if there is drainage. The initial bandage may be removed after 24 hours.  Once the wound has quit draining you may leave it open to air.  If clothing rubs against the wound or causes irritation and the wound is not draining you may cover it with a dry dressing during the daytime.  Try to keep the wound dry and avoid ointments on the wound unless directed to do so.  If the wound becomes bright red and painful or starts to drain infected material that is not clear, please contact your physician immediately.  If the wound is mildly pink and has a thick firm ridge underneath it, this is normal, and is referred to as a healing ridge.  This will resolve over the next 4-6 weeks.  BATHING: You may shower if you have been informed of this by your surgeon. However, Please do not submerge in a tub, hot tub, or pool until incisions are completely sealed or have been told by your surgeon that you may do so.  DIET:  You may eat any foods that you can tolerate.  It is a good idea to eat a high fiber diet and take in plenty of fluids to prevent constipation.  If you do become constipated you may want to take a mild laxative or take ducolax tablets on a daily basis until your bowel habits are regular.  Constipation can be very uncomfortable, along with straining, after recent surgery.  ACTIVITY:  You are encouraged to cough and deep breath or use your incentive spirometer if you were given one, every 15-30 minutes when awake.  This will help prevent respiratory complications and low grade fevers post-operatively if you had a general anesthetic.  You may want to hug a pillow when coughing and sneezing to add additional support to the surgical area, if you had abdominal or chest surgery, which will decrease pain during these  times.  You are encouraged to walk and engage in light activity for the next two weeks.  You should not lift more than 20 pounds for 6 weeks total after surgery as it could put you at increased risk for complications.  Twenty pounds is roughly equivalent to a plastic bag of groceries. At that time- Listen to your body when lifting, if you have pain when lifting, stop and then try again in a few days. Soreness after doing exercises or activities of daily living is normal as you get back in to your normal routine.  MEDICATIONS:  Try to take narcotic medications and anti-inflammatory medications, such as tylenol, ibuprofen, naprosyn, etc., with food.  This will minimize stomach upset from the medication.  Should you develop nausea and vomiting from the pain medication, or develop a rash, please discontinue the medication and contact your physician.  You should not drive, make important decisions, or operate machinery when taking narcotic pain medication.  SUNBLOCK Use sun block to incision area over the next year if this area will be exposed to sun. This helps decrease scarring and will allow you avoid a permanent darkened area over your incision.  QUESTIONS:  Please feel free to call our office if you have any questions, and we will be glad to assist you. [phone]

## 2023-07-26 ENCOUNTER — Other Ambulatory Visit: Payer: Self-pay | Admitting: Student in an Organized Health Care Education/Training Program

## 2023-07-26 DIAGNOSIS — M5030 Other cervical disc degeneration, unspecified cervical region: Secondary | ICD-10-CM

## 2023-07-26 DIAGNOSIS — M5124 Other intervertebral disc displacement, thoracic region: Secondary | ICD-10-CM

## 2023-07-26 DIAGNOSIS — G8929 Other chronic pain: Secondary | ICD-10-CM

## 2023-07-26 DIAGNOSIS — M5414 Radiculopathy, thoracic region: Secondary | ICD-10-CM

## 2023-07-26 DIAGNOSIS — M4692 Unspecified inflammatory spondylopathy, cervical region: Secondary | ICD-10-CM

## 2023-07-26 DIAGNOSIS — G894 Chronic pain syndrome: Secondary | ICD-10-CM

## 2023-07-27 ENCOUNTER — Telehealth: Payer: Self-pay | Admitting: Cardiology

## 2023-07-27 NOTE — Telephone Encounter (Signed)
 Please advise if you would like to take over refills for torsemide.

## 2023-07-27 NOTE — Telephone Encounter (Signed)
*  STAT* If patient is at the pharmacy, call can be transferred to refill team.   1. Which medications need to be refilled? (please list name of each medication and dose if known) torsemide  (DEMADEX ) 20 MG tablet   2. Which pharmacy/location (including street and city if local pharmacy) is medication to be sent to?  [id number] - [place], [place] - [address] [facility]      3. Do they need a 30 day or 90 day supply? 90 day

## 2023-07-27 NOTE — Telephone Encounter (Signed)
 Please advise if ok to send refill under Dr. [name] as the prescribing physician. Medication was originally prescribed by Marrion Coy, MD while the patient was in the hospital in November 2024.

## 2023-07-31 ENCOUNTER — Encounter: Payer: Self-pay | Admitting: Student in an Organized Health Care Education/Training Program

## 2023-07-31 ENCOUNTER — Ambulatory Visit
Payer: Medicare Other | Attending: Student in an Organized Health Care Education/Training Program | Admitting: Student in an Organized Health Care Education/Training Program

## 2023-07-31 VITALS — BP 118/75 | Temp 97.3°F | Resp 16 | Ht 65.0 in | Wt 186.0 lb

## 2023-07-31 DIAGNOSIS — M5124 Other intervertebral disc displacement, thoracic region: Secondary | ICD-10-CM | POA: Diagnosis present

## 2023-07-31 DIAGNOSIS — G8929 Other chronic pain: Secondary | ICD-10-CM

## 2023-07-31 DIAGNOSIS — M4692 Unspecified inflammatory spondylopathy, cervical region: Secondary | ICD-10-CM | POA: Diagnosis present

## 2023-07-31 DIAGNOSIS — M5412 Radiculopathy, cervical region: Secondary | ICD-10-CM

## 2023-07-31 DIAGNOSIS — M48062 Spinal stenosis, lumbar region with neurogenic claudication: Secondary | ICD-10-CM

## 2023-07-31 DIAGNOSIS — M5414 Radiculopathy, thoracic region: Secondary | ICD-10-CM

## 2023-07-31 DIAGNOSIS — M5030 Other cervical disc degeneration, unspecified cervical region: Secondary | ICD-10-CM

## 2023-07-31 DIAGNOSIS — M1712 Unilateral primary osteoarthritis, left knee: Secondary | ICD-10-CM | POA: Diagnosis present

## 2023-07-31 DIAGNOSIS — G894 Chronic pain syndrome: Secondary | ICD-10-CM | POA: Diagnosis present

## 2023-07-31 DIAGNOSIS — M25562 Pain in left knee: Secondary | ICD-10-CM | POA: Diagnosis present

## 2023-07-31 DIAGNOSIS — M5416 Radiculopathy, lumbar region: Secondary | ICD-10-CM | POA: Diagnosis present

## 2023-07-31 MED ORDER — METHOCARBAMOL 1000 MG/10ML IJ SOLN
200.0000 mg | Freq: Once | INTRAMUSCULAR | Status: AC
  Administered 2023-07-31: 200 mg via INTRAMUSCULAR
  Filled 2023-07-31: qty 10

## 2023-07-31 MED ORDER — TIZANIDINE HCL 4 MG PO TABS: 4.0000 mg | ORAL_TABLET | Freq: Two times a day (BID) | ORAL | 5 refills | Status: DC | PRN

## 2023-07-31 MED ORDER — HYDROCODONE-ACETAMINOPHEN 7.5-325 MG PO TABS: 1.0000 | ORAL_TABLET | Freq: Three times a day (TID) | ORAL | 0 refills | Status: DC | PRN

## 2023-07-31 MED ORDER — HYDROCODONE-ACETAMINOPHEN 7.5-325 MG PO TABS: 1.0000 | ORAL_TABLET | Freq: Three times a day (TID) | ORAL | 0 refills | Status: AC | PRN

## 2023-07-31 MED ORDER — GABAPENTIN 300 MG PO CAPS: 900.0000 mg | ORAL_CAPSULE | Freq: Two times a day (BID) | ORAL | 5 refills | Status: DC

## 2023-07-31 NOTE — Patient Instructions (Signed)

## 2023-07-31 NOTE — Progress Notes (Signed)
 PROVIDER NOTE: Information contained herein reflects review and annotations entered in association with encounter. Interpretation of such information and data should be left to medically-trained personnel. Information provided to patient can be located elsewhere in the medical record under Patient Instructions. Document created using STT-dictation technology, any transcriptional errors that may result from process are unintentional.    Patient: Tammy Lamb  Service Category: E/M  Provider: Wallie Sherry, MD  DOB: [birth year]  DOS: 07/31/2023  Referring Provider: Sherial Bail, MD  MRN: [id number]  Specialty: Interventional Pain Management  PCP: Sherial Bail, MD  Type: Established Patient  Setting: Ambulatory outpatient    Location: Office  Delivery: Face-to-face     HPI  Ms. British D Coggin, a 73 y.o. year old female, is here today because of her Chronic pain of left knee [M25.562, G89.29]. Ms. Crispen primary complain today is Back Pain (lower)  Pertinent problems: Ms. 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 Pain Assessment: Severity of Chronic pain is reported as a 6 /10. Location: Axilla Left, Lower/down to left hip down side of leg to knee, down front of leg top of foot to pinkie toe. Onset: More than a month ago. Quality: Aching, Constant, Nagging, Sore, Squeezing, Pounding. Timing:  . Modifying factor(s): rest, hot , cold. Vitals:  height is 5' 5 (1.651 m) and weight is 186 lb (84.4 kg). Her temperature is 97.3  F (36.3 C) (abnormal). Her blood pressure is 118/75. Her respiration is 16 and oxygen saturation is 98%.  BMI: Estimated body mass index is 30.95 kg/m as calculated from the following:   Height as of this encounter: 5' 5 (1.651 m).   Weight as of this encounter: 186 lb (84.4 kg). Last encounter: 06/26/2023. Last procedure: 03/28/2023.  Reason for encounter:   Discussed the use of AI scribe software for clinical note transcription with the patient, who gave verbal consent to proceed.  History of Present Illness   The patient, with a history of knee OA, presents with persistent pain in the left knee that radiates to the entire leg. The pain is severe enough to disrupt sleep The patient also reports a sensation of swelling in the leg, particularly around the little toe.  The patient recently experienced severe stomach pain after consuming solid foods during the holiday season, which led to a return to a diet of liquids and soft foods under the guidance of her surgeon. The patient is due for a follow-up with the surgeon soon.  The patient is currently on Plavix  and has been prescribed a muscle relaxer to help manage the pain. The patient also takes pain medication, which was consumed earlier on the day of the consultation. The patient has a history of back surgery and a torn meniscus.  The patient's medications, including Tizanidine  and Gabapentin , were recently refilled. The patient is also on Hydrocodone  for pain management. The patient's pharmacy is [facility] in [place].   Limited benefit with IA knee steroid injection done 03/28/23       Pharmacotherapy Assessment  Analgesic:  Hydrocodone  7.5mg  TID prn 90/month     Monitoring: [place] PMP: PDMP reviewed during this encounter.  Pharmacotherapy: No side-effects or adverse reactions reported. Compliance: No problems identified. Effectiveness: Clinically acceptable.  Dorlene Montie FALCON, RN  07/31/2023 10:38 AM  Sign when Signing Visit Nursing  Pain Medication Assessment:  Safety precautions to be maintained throughout the outpatient stay will include: orient to surroundings, keep bed in low position, maintain call bell within reach at all times, provide assistance with transfer out of bed and ambulation.  Medication Inspection Compliance: Pill count conducted under aseptic conditions, in front of the patient. Neither the pills nor the bottle was removed from the patient's sight at any time. Once count was completed pills were immediately returned to the patient in their original bottle.  Medication: See above Pill/Patch Count:  34 of 90 pills remain Pill/Patch Appearance: Markings consistent with prescribed medication Bottle Appearance: Standard pharmacy container. Clearly labeled. Filled Date: 51 / 14 / 2024 Last Medication intake:  Today  No results found for: CBDTHCR No results found for: D8THCCBX No results found for: D9THCCBX  UDS:  Summary  Date Value Ref Range Status  09/28/2022 Note  Final    Comment:    ==================================================================== [employer] Select 13 (MW) ==================================================================== Test                             Result       Flag       Units  Drug Present and Declared for Prescription Verification   Hydrocodone                     280          EXPECTED   ng/mg creat   Norhydrocodone                 979          EXPECTED   ng/mg creat    Sources of hydrocodone  include scheduled prescription medications.    Norhydrocodone is an expected metabolite of hydrocodone .  ==================================================================== Test                      Result    Flag   Units      Ref Range   Creatinine              61               mg/dL      >=79 ==================================================================== Declared Medications:  The flagging and interpretation on this report are based on the  following declared  medications.  Unexpected results may arise from  inaccuracies in the declared medications.   **Note: The testing scope of this panel includes these medications:   Hydrocodone  (Norco)   **Note: The testing scope of this panel does not include the  following reported medications:   Acetaminophen  (Tylenol )  Acetaminophen  (Norco)  Albuterol  (Ventolin  HFA)  Amlodipine  (Norvasc )  Atorvastatin  (Lipitor )  Cephalexin  (Keflex )  Cinnamon  Citalopram  (Celexa )  Clonidine (Catapres)  Duloxetine  (Cymbalta )  Fish Oil  Fluticasone  (Advair)  Gabapentin  (Neurontin )  Hydrochlorothiazide  (Hydrodiuril )  Hydroxyzine  (Atarax )  Magnesium  (Mag-Ox)  Omeprazole   Oxybutynin  (Ditropan )  Ramipril  (Altace )  Salmeterol (Advair)  Sodium Bicarbonate  Sulfasalazine  (Azulfidine )  Tizanidine  (Zanaflex )  Valsartan (Diovan)  Vitamin B12  Vitamin C  Vitamin D3 ==================================================================== For clinical consultation, please call [phone]. ====================================================================       ROS  Constitutional: Denies any fever or chills Gastrointestinal: No reported hemesis, hematochezia, vomiting, or acute GI distress Musculoskeletal:  low back  pain with radiation to bilateral legs, left knee pain  Neurological: No reported episodes of acute onset apraxia, aphasia, dysarthria, agnosia, amnesia, paralysis, loss of coordination, or loss of consciousness  Medication Review  Budeson-Glycopyrrol-Formoterol , Cinnamon, DULoxetine , Fish Oil, HYDROcodone -acetaminophen , Multiple Vitamins-Minerals, Turmeric, acetaminophen , albuterol , ascorbic acid , aspirin  EC, atorvastatin , cephALEXin , cholecalciferol , citalopram , clopidogrel , cyanocobalamin , feeding supplement, gabapentin , hydrOXYzine , metoprolol  tartrate, nitroGLYCERIN , oxybutynin , pantoprazole , pentosan polysulfate, sulfaSALAzine , tiZANidine , torsemide , and valsartan  History Review  Allergy:  Ms. Milligan is allergic to aspirin  and sulfa antibiotics. Drug: Ms. Howland  reports no history of drug use. Alcohol:  reports that she does not currently use alcohol. Tobacco:  reports that she quit smoking about 10 months ago. Her smoking use included cigarettes. She started smoking about 57 years ago. She has a 56.2 pack-year smoking history. She has been exposed to tobacco smoke. She has never used smokeless tobacco. Social: Ms. Briggs  reports that she quit smoking about 10 months ago. Her smoking use included cigarettes. She started smoking about 57 years ago. She has a 56.2 pack-year smoking history. She has been exposed to tobacco smoke. She has never used smokeless tobacco. She reports that she does not currently use alcohol. She reports that she does not use drugs. Medical:  has a past medical history of Anemia, Anxiety, Aortic atherosclerosis (HCC), Arthritis, Bilateral carotid artery stenosis, CAD (coronary artery disease), native coronary artery (10/05/2022), Cerebral microvascular disease, Chronic lower back pain, Colitis, ulcerative (HCC), COPD (chronic obstructive pulmonary disease) (HCC), DDD (degenerative disc disease), cervical, Depression, Diastolic dysfunction (10/19/2022), Dyspnea, GERD (gastroesophageal reflux disease), Hiatal hernia, History of 2019 novel coronavirus disease (COVID-19) (02/2021), HLD (hyperlipidemia), Hypertension, Interstitial cystitis, Long-term use of aspirin  therapy, Lumbar disc disease, Motion sickness, On chronic clopidogrel  therapy, Paraesophageal hernia, Parkinson's disease (HCC), Pulmonary hypertension (HCC) (04/18/2021), PVD (peripheral vascular disease) (HCC), T2DM (type 2 diabetes mellitus) (HCC), TIA (transient ischemic attack) (2015), Urinary frequency, and Wears dentures. Surgical: Ms. Raybourn  has a past surgical history that includes Tonsillectomy; Cholecystectomy; Hysterectomy abdominal with salpingo-oophorectomy; Diagnostic laparoscopy; Bladder suspension;  Colonoscopy; Knee arthroscopy (Left, 02/13/2014); Ankle arthroscopy (Left, 03/22/2016); Tendon repair (Left, 03/22/2016); Lumbar laminectomy/decompression microdiscectomy (N/A, 05/30/2017); Anterior cervical decomp/discectomy fusion (N/A, 04/09/2019); Lumbar laminectomy/decompression microdiscectomy (N/A, 05/27/2021); LEFT HEART CATH AND CORONARY ANGIOGRAPHY (N/A, 10/18/2022); CORONARY STENT INTERVENTION (N/A, 10/18/2022); Esophagogastroduodenoscopy (egd) with propofol  (N/A, 06/06/2023); Xi robotic assisted paraesophageal hernia repair (N/A, 06/19/2023); and Insertion of mesh (06/19/2023). Family: family history includes COPD in her father; Diabetes in her mother; Heart disease in her father; Hypertension in her father and mother.  Laboratory Chemistry Profile   Renal Lab Results  Component Value Date   BUN 8 06/23/2023   CREATININE 0.74 06/23/2023   GFRAA >60 04/02/2019   GFRNONAA >60 06/23/2023    Hepatic Lab Results  Component Value Date   AST 61 (H) 06/22/2023   ALT 40 06/22/2023   ALBUMIN  3.1 (L) 06/22/2023   ALKPHOS 36 (L) 06/22/2023    Electrolytes Lab Results  Component Value Date   NA 134 (L) 06/23/2023   K 3.8 06/23/2023   CL 99 06/23/2023   CALCIUM  9.0 06/23/2023   MG 1.9 06/23/2023    Bone No results found for: VD25OH, VD125OH2TOT, CI6874NY7, CI7874NY7, 25OHVITD1, 25OHVITD2, 25OHVITD3, TESTOFREE, TESTOSTERONE  Inflammation (CRP: Acute Phase) (ESR: Chronic Phase) No results found for: CRP, ESRSEDRATE, LATICACIDVEN       Note: Above Lab results reviewed.  Recent Imaging Review  CT ABDOMEN PELVIS W CONTRAST CLINICAL DATA:  Abdominal pain. Prior giant paraesophageal hernia repair.  EXAM: CT  ABDOMEN AND PELVIS WITH CONTRAST  TECHNIQUE: Multidetector CT imaging of the abdomen and pelvis was performed using the standard protocol following bolus administration of intravenous contrast.  RADIATION DOSE REDUCTION: This exam was performed  according to the departmental dose-optimization program which includes automated exposure control, adjustment of the mA and/or kV according to patient size and/or use of iterative reconstruction technique.  CONTRAST:  OMNIPAQUE  IOHEXOL  300 MG/ML  SOLN  COMPARISON:  CT chest 06/21/2023 and upper GI examination from 06/20/2023 in addition to PET-CT from 05/09/2023  FINDINGS: Lower chest: We partially image what appears to be a recurrent hiatal hernia potentially including a partially un wrapped fundoplication wrap yielding a somewhat complex appearance, with presumed fundoplication wrap extending up through the hiatus and a left paraesophageal collection of gas and fluid which appears to connect to the lumen of the herniated portion of the stomach.  There is some mild atelectasis in the lingula and minimal subsegmental atelectasis in the left lower lobe.  There is edema signal in the adipose tissue along the hiatus and hernia.  Hepatobiliary: Cholecystectomy. Common bile duct 0.7 cm in diameter, possibly a physiologic response to cholecystectomy.  Pancreas: Unremarkable  Spleen: Unremarkable  Adrenals/Urinary Tract: Benign bilateral renal cysts are present. Largest right-sided cyst 1.7 cm in the right kidney lower pole, largest left-sided cyst 1.3 cm in the left kidney upper pole. No further imaging workup of these lesions is indicated.  Both adrenal glands appear normal.  Urinary bladder unremarkable.  Stomach/Bowel: Complex recurrent hiatal hernia. Mildly abnormal appearance of the appendix, currently 0.8 cm in thickness which is mildly abnormally thickened on image 39 series 2. On 05/09/2023 the appendix was of normal caliber. Early acute appendicitis cannot be totally excluded. No surrounding inflammatory stranding in the adipose tissue noted.  Vascular/Lymphatic: Atherosclerosis is present, including aortoiliac atherosclerotic disease. Atheromatous plaque noted  at the origin of the celiac trunk and also proximally in the superior mesenteric artery without occlusion of either structure.  Small retroperitoneal lymph nodes are not pathologically enlarged.  Reproductive: Uterus absent.  Other: No supplemental non-categorized findings.  Musculoskeletal: Multilevel lumbar degenerative disc disease.  IMPRESSION: 1. Small but complex recurrent herniation at the hiatus part of the stomach herniating cephalad, probably including part of a fundoplication wrap. 2. Mildly thickened appendix, 8 mm in diameter, early acute appendicitis is not excluded although there is no surrounding periappendiceal stranding. Correlate with patient's symptoms in determining appropriate surveillance or intervention. 3. Multilevel lumbar degenerative disc disease.  Aortic Atherosclerosis (ICD10-I70.0).  Electronically Signed   By: Ryan Salvage M.D.   On: 07/23/2023 15:17 Note: Reviewed        Physical Exam  General appearance: Well nourished, well developed, and well hydrated. In no apparent acute distress Mental status: Alert, oriented x 3 (person, place, & time)       Respiratory: No evidence of acute respiratory distress Eyes: PERLA Vitals: BP 118/75   Temp (!) 97.3 F (36.3 C)   Resp 16   Ht 5' 5 (1.651 m)   Wt 186 lb (84.4 kg)   SpO2 98%   BMI 30.95 kg/m  BMI: Estimated body mass index is 30.95 kg/m as calculated from the following:   Height as of this encounter: 5' 5 (1.651 m).   Weight as of this encounter: 186 lb (84.4 kg). Ideal: Ideal body weight: 57 kg (125 lb 10.6 oz) Adjusted ideal body weight: 67.9 kg (149 lb 12.8 oz)  Low back pain with radiation into bilateral legs in  a dermatomal fashion  Left knee pain, worse with weightbearing  Assessment   Diagnosis  1. Chronic pain of left knee   2. Primary osteoarthritis of left knee   3. Radicular pain of thoracic region   4. Lumbar radiculopathy   5. Spinal stenosis, lumbar  region, with neurogenic claudication   6. Chronic pain syndrome   7. DDD (degenerative disc disease), cervical   8. Thoracic disc herniation   9. Inflammatory spondylopathy of cervical region (HCC)   10. Chronic radicular cervical pain      Updated Problems: No problems updated.  Plan of Care  Problem-specific:  Assessment and Plan    Left Knee Pain Chronic left knee pain with radiation, likely exacerbated by underlying arthritis or referred pain from the spine, has been noted. Previous steroid injection offered limited relief. We will schedule a genicular nerve block for the left knee in the next couple of weeks, considering its potential for significant pain reduction. This minimally invasive procedure targets three nerves around the knee and, if successful, may lead to nerve ablation for longer-term relief. We discussed the low risk of complications associated with the nerve block. MRI results will be reviewed during the next visit.  Lumbar Spine Pain Chronic lumbar spine pain, possibly with radiculopathy contributing to leg pain, has been observed. Given the last MRI was in 2022, we will order a new MRI of the lumbar spine to assess the current status and differentiate between pain originating from the spine versus the knee. Hx of L1-S1 decompressoin.  She has a history of postlaminectomy pain syndrome, we can consider thoracolumbar spinal cord stimulation  Medication Management Her current medications include tizanidine , gabapentin , and hydrocodone , with Plavix  use contraindicating Toradol . We discussed the risks of combining Plavix  with NSAIDs and decided to administer a muscle relaxer injection (Robaxin ) today. Refills for tizanidine , gabapentin , and hydrocodone  will be processed.       Ms. Naavya D Igou has a current medication list which includes the following long-term medication(s): albuterol , atorvastatin , duloxetine , gabapentin , metoprolol  tartrate, nitroglycerin , pantoprazole ,  sulfasalazine , tizanidine , torsemide , and valsartan.  Pharmacotherapy (Medications Ordered): Meds ordered this encounter  Medications   HYDROcodone -acetaminophen  (NORCO) 7.5-325 MG tablet    Sig: Take 1 tablet by mouth every 8 (eight) hours as needed for moderate pain (pain score 4-6).    Dispense:  90 tablet    Refill:  0   HYDROcodone -acetaminophen  (NORCO) 7.5-325 MG tablet    Sig: Take 1 tablet by mouth every 8 (eight) hours as needed for moderate pain (pain score 4-6).    Dispense:  90 tablet    Refill:  0   HYDROcodone -acetaminophen  (NORCO) 7.5-325 MG tablet    Sig: Take 1 tablet by mouth every 8 (eight) hours as needed for moderate pain (pain score 4-6).    Dispense:  90 tablet    Refill:  0   methocarbamol  (ROBAXIN ) injection 200 mg   gabapentin  (NEURONTIN ) 300 MG capsule    Sig: Take 3 capsules (900 mg total) by mouth 2 (two) times daily.    Dispense:  180 capsule    Refill:  5   tiZANidine  (ZANAFLEX ) 4 MG tablet    Sig: Take 1 tablet (4 mg total) by mouth every 12 (twelve) hours as needed.    Dispense:  60 tablet    Refill:  5    Fill one day early if pharmacy is closed on scheduled refill date. Generic permitted. Do not send renewal requests. Void any older duplicate prescription or  refill(s) that may be on file.   Orders:  Orders Placed This Encounter  Procedures   GENICULAR NERVE BLOCK    Indication(s):  Sub-acute knee pain    Standing Status:   Future    Expected Date:   08/22/2023    Expiration Date:   10/29/2023    Scheduling Instructions:     Side: LEFT     Sedation: WITHOUT     Timeframe: As soon as schedule allows    Where will this procedure be performed?:   [facility]   MR LUMBAR SPINE WO CONTRAST    Patient presents with axial pain with possible radicular component. Please assist us  in identifying specific level(s) and laterality of any additional findings such as: 1. Facet (Zygapophyseal) joint DJD (Hypertrophy, space narrowing, subchondral  sclerosis, and/or osteophyte formation) 2. DDD and/or IVDD (Loss of disc height, desiccation, gas patterns, osteophytes, endplate sclerosis, or Black disc disease) 3. Pars defects 4. Spondylolisthesis, spondylosis, and/or spondyloarthropathies (include Degree/Grade of displacement in mm) (stability) 5. Vertebral body Fractures (acute/chronic) (state percentage of collapse) 6. Demineralization (osteopenia/osteoporotic) 7. Bone pathology 8. Foraminal narrowing  9. Surgical changes 10. Central, Lateral Recess, and/or Foraminal Stenosis (include AP diameter of stenosis in mm) 11. Surgical changes (hardware type, status, and presence of fibrosis) 12. Modic Type Changes (MRI only) 13. IVDD (Disc bulge, protrusion, herniation, extrusion) (Level, laterality, extent)    Standing Status:   Future    Expiration Date:   08/31/2023    Scheduling Instructions:     Please make sure that the patient understands that this needs to be done as soon as possible. Never have the patient do the imaging just before the next appointment. Inform patient that having the imaging done within the [facility] will expedite the availability of the results and will provide      imaging availability to the requesting physician. In addition inform the patient that the imaging order has an expiration date and will not be renewed if not done within the active period.    What is the patient's sedation requirement?:   No Sedation    Does the patient have a pacemaker or implanted devices?:   No    Preferred imaging location?:   [facility] (table limit-350lbs)    Call Results- Best Contact Number?:   [phone] [facility])    Radiology Contrast Protocol - do NOT remove file path:   [email or web address]   Follow-up plan:   Return in about 22 days (around 08/22/2023) for Left GNB, in clinic NS.     Recent Visits Date Type Provider Dept  06/26/23 Office Visit Marcelino Nurse, MD  [facility]  Showing recent visits within past 90 days and meeting all other requirements Today's Visits Date Type Provider Dept  07/31/23 Office Visit Marcelino Nurse, MD [facility]  Showing today's visits and meeting all other requirements Future Appointments No visits were found meeting these conditions. Showing future appointments within next 90 days and meeting all other requirements  I discussed the assessment and treatment plan with the patient. The patient was provided an opportunity to ask questions and all were answered. The patient agreed with the plan and demonstrated an understanding of the instructions.  Patient advised to call back or seek an in-person evaluation if the symptoms or condition worsens.  Duration of encounter: .  Total time on encounter, as per AMA guidelines included both the face-to-face and non-face-to-face time personally spent by the physician and/or other qualified health  care professional(s) on the day of the encounter (includes time in activities that require the physician or other qualified health care professional and does not include time in activities normally performed by clinical staff). Physician's time may include the following activities when performed: Preparing to see the patient (e.g., pre-charting review of records, searching for previously ordered imaging, lab work, and nerve conduction tests) Review of prior analgesic pharmacotherapies. Reviewing PMP Interpreting ordered tests (e.g., lab work, imaging, nerve conduction tests) Performing post-procedure evaluations, including interpretation of diagnostic procedures Obtaining and/or reviewing separately obtained history Performing a medically appropriate examination and/or evaluation Counseling and educating the patient/family/caregiver Ordering medications, tests, or procedures Referring and communicating with other health care professionals (when not separately  reported) Documenting clinical information in the electronic or other health record Independently interpreting results (not separately reported) and communicating results to the patient/ family/caregiver Care coordination (not separately reported)  Note by: Wallie Sherry, MD Date: 07/31/2023; Time: 11:06 AM

## 2023-07-31 NOTE — Progress Notes (Signed)
 Nursing Pain Medication Assessment:  Safety precautions to be maintained throughout the outpatient stay will include: orient to surroundings, keep bed in low position, maintain call bell within reach at all times, provide assistance with transfer out of bed and ambulation.  Medication Inspection Compliance: Pill count conducted under aseptic conditions, in front of the patient. Neither the pills nor the bottle was removed from the patient's sight at any time. Once count was completed pills were immediately returned to the patient in their original bottle.  Medication: See above Pill/Patch Count:  34 of 90 pills remain Pill/Patch Appearance: Markings consistent with prescribed medication Bottle Appearance: Standard pharmacy container. Clearly labeled. Filled Date: 78 / 14 / 2024 Last Medication intake:  Today

## 2023-08-03 ENCOUNTER — Other Ambulatory Visit: Payer: Self-pay

## 2023-08-03 MED ORDER — TORSEMIDE 20 MG PO TABS: 20.0000 mg | ORAL_TABLET | Freq: Every day | ORAL | 0 refills | Status: DC | PRN

## 2023-08-03 NOTE — Telephone Encounter (Signed)
 Disp Refills Start End   torsemide  (DEMADEX ) 20 MG tablet 90 tablet 0 08/03/2023 --   Sig - Route: Take 1 tablet (20 mg total) by mouth daily as needed. - Oral   Sent to pharmacy as: torsemide  (DEMADEX ) 20 MG tablet   E-Prescribing Status: Receipt confirmed by pharmacy (08/03/2023  8:54 AM EST)    Pharmacy  [id number] - [place], [place] - [address] [place] [address] AT [facility]

## 2023-08-06 ENCOUNTER — Ambulatory Visit (INDEPENDENT_AMBULATORY_CARE_PROVIDER_SITE_OTHER): Payer: Medicare Other | Admitting: Surgery

## 2023-08-06 ENCOUNTER — Encounter: Payer: Self-pay | Admitting: Surgery

## 2023-08-06 VITALS — BP 116/76 | HR 71 | Temp 98.3°F | Ht 65.0 in | Wt 192.8 lb

## 2023-08-06 DIAGNOSIS — K449 Diaphragmatic hernia without obstruction or gangrene: Secondary | ICD-10-CM

## 2023-08-06 DIAGNOSIS — Z09 Encounter for follow-up examination after completed treatment for conditions other than malignant neoplasm: Secondary | ICD-10-CM

## 2023-08-06 NOTE — Patient Instructions (Signed)
Eating Plan After Nissen Fundoplication After a Nissen fundoplication procedure, it is common to have some difficulty swallowing. The part of your body that moves food and liquid from your mouth to your stomach (esophagus) will be swollen and may feel tight. It will take several weeks or months for your esophagus and stomach to heal. By following a special eating plan, you can prevent problems such as pain, swelling or pressure in the abdomen (bloating), gas, nausea, or diarrhea. What are tips for following this plan? Cooking Cook all foods until they are soft. Remove skins and seeds from fruits and vegetables before eating. Remove skin and gristle from meats. Grind or finely mince meats before eating. Avoid over-cooking meat. Dry, tough meat is more difficult to swallow. Avoid using oil when cooking, or use only a small amount of oil. Avoid using seasoning when cooking, or use only a small amount of seasoning. Toast bread before eating. This makes it easier to swallow. Meal planning  Eat 6-8 small meals throughout the day. Right after the surgery, have a few meals that are only clear liquids. Clear liquids include: Water. Clear fruit juice, no pulp. Chicken, beef, or vegetable broth. Gelatin. Decaffeinated tea or coffee without milk. Popsicles or shaved ice. Depending on your progress, you may move to a full liquid diet as told by your health care provider. This includes clear liquids and the following: Dairy and alternative milks, such as soy milk. Strained creamed soups. Ice cream or sherbet. Pudding. Nutritional supplement drinks. Yogurt. A few days after surgery, you may be able to start eating a diet of soft foods. You may need to eat according to this plan for several weeks. Do not eat sweets or sweetened drinks at the beginning of a meal. Doing that may cause your stomach to empty faster than it should (dumping syndrome). Lifestyle Always sit upright when eating or  drinking. Eat slowly. Take small bites and chew food well before swallowing. Do not lie down after eating. Stay sitting up for 30 minutes or longer after each meal. Sip fluids between meals. Limit how much you drink at one time. With meals and snacks, have 4-8 oz (120-240 mL). This is equal to  cup-1 cup. Do not mix solid foods and liquids in the same mouthful. Drink enough fluid to keep your urine pale yellow. Do not chew gum or drink fluids through a straw. Doing those things may cause you to swallow extra air. General information Do not drink carbonated drinks or alcohol. Avoid foods and drinks that contain caffeine and chocolate. Avoid foods and drinks that contain citrus or tomato. Allow hot soups and drinks to cool before eating. Avoid foods that cause gas, such as beans, peas, broccoli, or cabbage. If dairy milk products cause diarrhea, avoid them or eat them in small amounts. Recommended foods Fruits Any soft-cooked fruits after skins and seeds are removed. Fruit juice. Vegetables Any soft-cooked vegetables after skins and seeds are removed. Vegetable juice. Grains Cooked cereals. Dry cereals softened with liquid. Cooked pasta, rice, or other grains. Toasted bread. Bland crackers, such as soda or graham crackers. Meats and other protein foods Tender cuts of meat, poultry, or fish after bones, skin, and gristle are removed. Poached, boiled, or scrambled eggs. Canned fish. Tofu. Creamy nut butters. Dairy Milk. Yogurt. Cottage cheese. Mild cheeses. Beverages Nutritional supplement drinks. Decaffeinated tea or coffee. Sports drinks. Fats and oils Butter. Margarine. Mayonnaise. Vegetable oil. Smooth salad dressing. Sweets and desserts Plain hard candy. Marshmallows. Pudding. Ice cream.  Gelatin. Sherbet. Seasoning and other foods Salt. Light seasonings. Mustard. Vinegar. The items listed above may not be a complete list of recommended foods and beverages. Contact a dietitian for  more information. Foods to avoid Fruits Oranges. Grapefruit. Lemons. Limes. Citrus juices. Dried fruit. Crunchy, raw fruits. Vegetables Tomato sauce. Tomato juice. Broccoli. Cauliflower. Cabbage. Brussels sprouts. Crunchy, raw vegetables. Grains High-fiber or bran cereal. Cereal with nuts, dried fruit, or coconut. Sweet breads, rolls, coffee cake, or donuts. Chewy or crusty breads. Popcorn. Meats and other protein foods Beans, peas, and lentils. Tough or fatty meats. Fried meats, chicken, or fish. Fried eggs. Nuts and seeds. Crunchy nut butters. Dairy Chocolate milk. Yogurt with chunks of fruit, nuts, seeds, or coconut. Strong cheeses. Beverages Carbonated soft drinks. Alcohol. Cocoa. Hot drinks. Fats and oils Bacon fat. Lard. Sweets and desserts Chocolate. Candy with nuts, coconut, or seeds. Peppermint. Cookies. Cakes. Pie crust. Seasoning and other foods Heavy seasonings. Chili sauce. Ketchup. Barbecue sauce. Rosita Fire. Horseradish. The items listed above may not be a complete list of foods and beverages to avoid. Contact a dietitian for more information. Summary Following this eating plan after a Nissen fundoplication is an important part of healing after surgery. After surgery, you will start with a clear liquid diet before you progress to full liquids and soft foods. You may need to eat soft foods for several weeks. Avoid eating foods that cause irritation, gas, nausea, diarrhea, or swelling or pressure in the abdomen (bloating), and avoid foods that are difficult to swallow. Talk with a dietitian about which dietary choices are best for you. This information is not intended to replace advice given to you by your health care provider. Make sure you discuss any questions you have with your health care provider. Document Revised: 01/25/2020 Document Reviewed: 01/25/2020 Elsevier Patient Education  2024 [employer].

## 2023-08-08 ENCOUNTER — Ambulatory Visit (HOSPITAL_COMMUNITY): Admission: RE | Admit: 2023-08-08 | Payer: Medicare Other | Source: Ambulatory Visit

## 2023-08-08 NOTE — Progress Notes (Signed)
 Is 6 weeks out from robotic paraesophageal hernia.-year-old she is doing better but she continues to be noncompliant with her diet.  Tolerating p.o. having bowel movements.  No fevers no chills CT personally reviewed showing no acute intra-abdominal abnormalities and paraesophageal hernia with significant improvement after repair left appendix the radiology mention mild thickening but clinically no evidence of appendicitis on the CT scan no significant evidence of appendicitis   Physical Exam   NAD Abd: soft, mild tenderness w/o peritonitis, no abscess orinfection    A/p doing well considering her injury and her limited physiological reserve.  No need for surgical intervention at this time.  I will be happy to see her in a couple months or so.  Discussed with her about optimization of medical management.

## 2023-08-10 ENCOUNTER — Other Ambulatory Visit: Payer: Self-pay

## 2023-08-10 ENCOUNTER — Ambulatory Visit
Admission: RE | Admit: 2023-08-10 | Discharge: 2023-08-10 | Disposition: A | Discharge status: Home / Self Care | Payer: Medicare Other | Source: Ambulatory Visit | Attending: Student in an Organized Health Care Education/Training Program | Admitting: Student in an Organized Health Care Education/Training Program

## 2023-08-10 DIAGNOSIS — M5416 Radiculopathy, lumbar region: Secondary | ICD-10-CM

## 2023-08-10 DIAGNOSIS — M48062 Spinal stenosis, lumbar region with neurogenic claudication: Secondary | ICD-10-CM

## 2023-08-10 DIAGNOSIS — G894 Chronic pain syndrome: Secondary | ICD-10-CM | POA: Diagnosis present

## 2023-08-10 MED ORDER — PANTOPRAZOLE SODIUM 40 MG PO TBEC: 40.0000 mg | DELAYED_RELEASE_TABLET | Freq: Every day | ORAL | 0 refills | Status: DC

## 2023-08-10 NOTE — Telephone Encounter (Signed)
Refill sent for Pantoprazole 40 mg  

## 2023-08-13 ENCOUNTER — Encounter: Payer: Self-pay | Admitting: Pulmonary Disease

## 2023-08-13 ENCOUNTER — Ambulatory Visit: Payer: Medicare Other | Admitting: Pulmonary Disease

## 2023-08-13 VITALS — BP 134/82 | HR 60 | Temp 96.6°F | Ht 65.0 in | Wt 196.4 lb

## 2023-08-13 DIAGNOSIS — K219 Gastro-esophageal reflux disease without esophagitis: Secondary | ICD-10-CM

## 2023-08-13 DIAGNOSIS — K449 Diaphragmatic hernia without obstruction or gangrene: Secondary | ICD-10-CM

## 2023-08-13 DIAGNOSIS — R911 Solitary pulmonary nodule: Secondary | ICD-10-CM

## 2023-08-13 DIAGNOSIS — Z87891 Personal history of nicotine dependence: Secondary | ICD-10-CM

## 2023-08-13 DIAGNOSIS — J449 Chronic obstructive pulmonary disease, unspecified: Secondary | ICD-10-CM

## 2023-08-13 NOTE — Patient Instructions (Signed)
VISIT SUMMARY:  During today's visit, we discussed your recent health concerns, including your COPD, post-operative status following esophageal hernia repair, and back pain. We reviewed your current treatment plan and made some adjustments to help manage your symptoms more effectively.  YOUR PLAN:  -CHRONIC OBSTRUCTIVE PULMONARY DISEASE (COPD): COPD is a chronic lung disease that makes it hard to breathe. You have moderate COPD, and your symptoms have improved with the use of the Breztri inhaler. To help with occasional shortness of breath at night, it is recommended to elevate your head while sleeping. Please continue using the [facility] inhaler as prescribed. We will reassess your condition in 4 months.  -ESOPHAGEAL HERNIA: An esophageal hernia occurs when part of the stomach pushes up through the diaphragm into the chest. After your recent surgery, you experienced discomfort from overeating, which required you to return to a liquid and soft food diet. To prevent further issues, it is important to eat smaller, more frequent meals. Please monitor for any complications and follow this dietary advice.  INSTRUCTIONS:  Please schedule a follow-up appointment in 4 months to reassess your COPD and overall health.

## 2023-08-13 NOTE — Progress Notes (Unsigned)
Subjective:    Patient ID: Tammy Lamb, female    DOB: [birth year], 73 y.o.   MRN: [id number]  Patient Care Team: Enid Baas, MD as PCP - General (Internal Medicine) Debbe Odea, MD as PCP - Cardiology (Cardiology) Salena Saner, MD as Consulting Physician (Pulmonary Disease)  Chief Complaint  Patient presents with   Follow-up    SOB at night when she lays down. No wheezing or cough. Hernia repair was 06/19/2023    BACKGROUND/INTERVAL:  HPI Discussed the use of AI scribe software for clinical note transcription with the patient, who gave verbal consent to proceed.  History of Present Illness   The patient, with a history of COPD and recent esophageal hernia repair, reports an episode of overeating during the Christmas holiday which led to discomfort and necessitated a return to a liquid and soft food diet. She acknowledges the need for smaller, more frequent meals moving forward. The patient notes overall improvement in shortness of breath since starting on an inhaler, with the exception of some nocturnal dyspnea when lying flat.  In addition to the above, the patient underwent an MRI for back pain under the care of Dr. Lourdes Sledge, who is considering nerve work on the patient's knee. The patient also has a history of gallbladder removal, during which a mesh was inserted. It is unclear if a mesh was used during the recent hernia repair.         Review of Systems A 10 point review of systems was performed and it is as noted above otherwise negative.   Patient Active Problem List   Diagnosis Date Noted   Acute on chronic diastolic CHF (congestive heart failure) (HCC) 06/22/2023   COPD (chronic obstructive pulmonary disease) (HCC) 06/21/2023   Acute hypoxic respiratory failure (HCC) 06/21/2023   Elevated LFTs 06/21/2023   Protein calorie malnutrition (HCC) 06/21/2023   Depression 06/21/2023   Acute anemia 06/21/2023   Hyponatremia 06/21/2023   Hiatal hernia  with gastroesophageal reflux 06/19/2023   S/P repair of paraesophageal hernia 06/19/2023   Primary osteoarthritis of right knee 03/27/2023   Chronic pain of left knee 03/27/2023   Influenza A 10/20/2022   Coronavirus infection 10/20/2022   Chest pain 10/19/2022   Coronary artery disease involving native coronary artery of native heart with unstable angina pectoris (HCC) 10/19/2022   Status post primary angioplasty with coronary stent 10/19/2022   Unstable angina (HCC) 10/18/2022   COPD with acute exacerbation (HCC) 10/18/2022   AKI (acute kidney injury) (HCC) 10/18/2022   Ulcerative colitis (HCC) 10/18/2022   Bilateral hip pain 12/26/2021   Thoracic disc herniation 08/25/2021   Radicular pain of thoracic region 08/25/2021   Atherosclerotic peripheral vascular disease with intermittent claudication (HCC) 03/08/2021   Benign essential HTN 03/08/2021   Bilateral carotid artery stenosis 03/08/2021   Hyperlipidemia, mixed 03/08/2021   Bilateral occipital neuralgia 09/16/2020   Chronic SI joint pain 09/16/2020   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   High-tone pelvic floor dysfunction 08/21/2019   Recurrent UTI 08/21/2019   S/P cervical spinal fusion (C3-C4 ACDF (03/2019) 04/29/2019   Cervicalgia 04/29/2019   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   Cervical facet joint syndrome 10/01/2018   DDD (degenerative disc disease), cervical 10/01/2018   Chronic radicular cervical pain 10/01/2018   Compression fracture of lumbar vertebra, non-traumatic, sequela 02/28/2018   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/28/2018   Spinal stenosis, lumbar region, with neurogenic claudication 09/27/2017   Lumbar radiculopathy 09/27/2017   Lumbar  degenerative disc disease 09/27/2017   Lumbar facet arthropathy 09/27/2017   Chronic pain syndrome 09/27/2017   Neurogenic claudication 05/30/2017    Social History   Tobacco Use   Smoking status: Former     Current packs/day: 0.00    Average packs/day: 1 pack/day for 56.2 years (56.2 ttl pk-yrs)    Types: Cigarettes    Start date: 80    Quit date: 09/22/2022    Years since quitting: 0.8    Passive exposure: Past   Smokeless tobacco: Never   Tobacco comments:    Quit in March of 2024.  Substance Use Topics   Alcohol use: Not Currently    Comment: Occassional,    Allergies  Allergen Reactions   Aspirin Other (See Comments)    GERD   Sulfa Antibiotics Nausea And Vomiting    Current Meds  Medication Sig   acetaminophen (TYLENOL) 500 MG tablet Take 1,000 mg by mouth every 6 (six) hours as needed for moderate pain or headache.   albuterol (VENTOLIN HFA) 108 (90 Base) MCG/ACT inhaler Inhale 2 puffs into the lungs every 6 (six) hours as needed for wheezing or shortness of breath.   ascorbic acid (VITAMIN C) 1000 MG tablet Take 1,000 mg by mouth once a week.   aspirin EC 81 MG tablet Take 1 tablet (81 mg total) by mouth daily. Swallow whole.   atorvastatin (LIPITOR) 80 MG tablet Take 80 mg by mouth at bedtime.   Budeson-Glycopyrrol-Formoterol (BREZTRI AEROSPHERE) 160-9-4.8 MCG/ACT AERO Inhale 2 puffs into the lungs in the morning and at bedtime.   cephALEXin (KEFLEX) 250 MG capsule Take 250 mg by mouth daily.   cholecalciferol (VITAMIN D) 25 MCG (1000 UNIT) tablet Take 1,000 Units by mouth at bedtime.   Cinnamon 500 MG TABS Take 500 mg by mouth in the morning and at bedtime.   citalopram (CELEXA) 20 MG tablet Take 20 mg by mouth every morning.   clopidogrel (PLAVIX) 75 MG tablet Take 1 tablet (75 mg total) by mouth daily with breakfast.   feeding supplement (ENSURE ENLIVE / ENSURE PLUS) LIQD Take 237 mLs by mouth 2 (two) times daily between meals.   gabapentin (NEURONTIN) 300 MG capsule Take 3 capsules (900 mg total) by mouth 2 (two) times daily.   HYDROcodone-acetaminophen (NORCO) 7.5-325 MG tablet Take 1 tablet by mouth every 8 (eight) hours as needed for moderate pain (pain score 4-6).    [START ON 09/05/2023] HYDROcodone-acetaminophen (NORCO) 7.5-325 MG tablet Take 1 tablet by mouth every 8 (eight) hours as needed for moderate pain (pain score 4-6).   [START ON 10/05/2023] HYDROcodone-acetaminophen (NORCO) 7.5-325 MG tablet Take 1 tablet by mouth every 8 (eight) hours as needed for moderate pain (pain score 4-6).   hydrOXYzine (ATARAX/VISTARIL) 25 MG tablet Take 25 mg by mouth at bedtime.   metoprolol tartrate (LOPRESSOR) 25 MG tablet Take 1 tablet (25 mg total) by mouth 2 (two) times daily.   Multiple Vitamins-Minerals (HAIR SKIN NAILS PO) Take 1 tablet by mouth in the morning and at bedtime.   Omega-3 Fatty Acids (FISH OIL) 1200 MG CAPS Take 1,200 mg by mouth once a week.   oxybutynin (DITROPAN-XL) 10 MG 24 hr tablet Take 10 mg by mouth at bedtime.   pantoprazole (PROTONIX) 40 MG tablet Take 1 tablet (40 mg total) by mouth daily.   pentosan polysulfate (ELMIRON) 100 MG capsule Take 100 mg by mouth 2 (two) times daily.   sulfaSALAzine (AZULFIDINE) 500 MG tablet Take 500 mg by mouth 2 (  two) times daily.   tiZANidine (ZANAFLEX) 4 MG tablet Take 1 tablet (4 mg total) by mouth every 12 (twelve) hours as needed.   torsemide (DEMADEX) 20 MG tablet Take 1 tablet (20 mg total) by mouth daily as needed.   TURMERIC CURCUMIN PO Take 1 capsule by mouth daily.   valsartan (DIOVAN) 160 MG tablet Take 160 mg by mouth daily.   vitamin B-12 (CYANOCOBALAMIN) 1000 MCG tablet Take 1,000 mcg by mouth daily.    Immunization History  Administered Date(s) Administered   Fluad Trivalent(High Dose 65+) 05/01/2023   Influenza Inj Mdck Quad Pf 05/17/2021   Janssen (J&J) SARS-COV-2 Vaccination 11/24/2019   Moderna Sars-Covid-2 Vaccination 07/25/2020   PNEUMOCOCCAL CONJUGATE-20 06/28/2022   Tdap 05/17/2021        Objective:     BP 134/82 (BP Location: Right Arm, Cuff Size: Normal)   Pulse 60   Temp (!) 96.6 F (35.9 C)   Ht 5\' 5"  (1.651 m)   Wt 196 lb 6.4 oz (89.1 kg)   SpO2 98%   BMI 32.68  kg/m   SpO2: 98 % O2 Device: None (Room air)  GENERAL: HEAD: Normocephalic, atraumatic.  EYES: Pupils equal, round, reactive to light.  No scleral icterus.  MOUTH:  NECK: Supple. No thyromegaly. Trachea midline. No JVD.  No adenopathy. PULMONARY: Good air entry bilaterally.  No adventitious sounds. CARDIOVASCULAR: S1 and S2. Regular rate and rhythm.  ABDOMEN: MUSCULOSKELETAL: No joint deformity, no clubbing, no edema.  NEUROLOGIC:  SKIN: Intact,warm,dry. PSYCH:        Assessment & Plan:     ICD-10-CM   1. Hiatal hernia with GERD  K44.9    K21.9     2. Stage 2 moderate COPD by GOLD classification (HCC)  J44.9     3. Lung nodule  R91.1     4. Former smoker  Z87.891      Discussion:    Chronic Obstructive Pulmonary Disease (COPD) Moderate COPD with improvement in dyspnea using Breztri inhaler. Occasional nocturnal dyspnea noted. Discussed benefits of Breztri in preventing disease progression and adherence importance. - Continue Breztri inhaler - Advise head elevation during sleep for nocturnal dyspnea - Follow-up in 4 months to assess COPD status  Esophageal Hernia Post-operative status following large esophageal hernia repair. Issues with overeating leading to complications, necessitating return to liquids and soft foods. Discussed importance of smaller, frequent meals in symptom management. - Advise smaller, frequent meals - Monitor for complications  Follow-up - Follow-up appointment in 4 months.      Advised if symptoms do not improve or worsen, to please contact office for sooner follow up or seek emergency care.    I spent xxx minutes of dedicated to the care of this patient on the date of this encounter to include pre-visit review of records, face-to-face time with the patient discussing conditions above, post visit ordering of testing, clinical documentation with the electronic health record, making appropriate referrals as documented, and communicating  necessary findings to members of the patients care team.     C. Danice Goltz, MD Advanced Bronchoscopy PCCM [facility]    *This note was generated using voice recognition software/Dragon and/or AI transcription program.  Despite best efforts to proofread, errors can occur which can change the meaning. Any transcriptional errors that result from this process are unintentional and may not be fully corrected at the time of dictation.

## 2023-08-15 ENCOUNTER — Encounter: Payer: Self-pay | Admitting: Pulmonary Disease

## 2023-08-22 ENCOUNTER — Ambulatory Visit
Payer: Medicare Other | Attending: Student in an Organized Health Care Education/Training Program | Admitting: Student in an Organized Health Care Education/Training Program

## 2023-08-22 ENCOUNTER — Encounter: Payer: Self-pay | Admitting: Student in an Organized Health Care Education/Training Program

## 2023-08-22 ENCOUNTER — Ambulatory Visit
Admission: RE | Admit: 2023-08-22 | Discharge: 2023-08-22 | Disposition: A | Discharge status: Home / Self Care | Payer: Medicare Other | Source: Ambulatory Visit | Attending: Student in an Organized Health Care Education/Training Program | Admitting: Student in an Organized Health Care Education/Training Program

## 2023-08-22 VITALS — BP 143/79 | HR 58 | Temp 96.8°F | Resp 20 | Ht 65.0 in | Wt 192.6 lb

## 2023-08-22 DIAGNOSIS — M25562 Pain in left knee: Secondary | ICD-10-CM | POA: Diagnosis present

## 2023-08-22 DIAGNOSIS — M1712 Unilateral primary osteoarthritis, left knee: Secondary | ICD-10-CM | POA: Diagnosis present

## 2023-08-22 DIAGNOSIS — G8929 Other chronic pain: Secondary | ICD-10-CM | POA: Diagnosis present

## 2023-08-22 MED ORDER — TRIAMCINOLONE ACETONIDE 40 MG/ML IJ SUSP: 40.0000 mg | Freq: Once | INTRAMUSCULAR | Status: DC

## 2023-08-22 MED ORDER — ROPIVACAINE HCL 2 MG/ML IJ SOLN
9.0000 mL | Freq: Once | INTRAMUSCULAR | Status: AC
Start: 2023-08-22 — End: 2023-08-22
  Administered 2023-08-22: 9 mL via PERINEURAL

## 2023-08-22 MED ORDER — ROPIVACAINE HCL 2 MG/ML IJ SOLN
INTRAMUSCULAR | Status: AC
  Filled 2023-08-22: qty 20

## 2023-08-22 MED ORDER — LIDOCAINE HCL 2 % IJ SOLN
20.0000 mL | Freq: Once | INTRAMUSCULAR | Status: AC
Start: 2023-08-22 — End: 2023-08-22
  Administered 2023-08-22: 400 mg

## 2023-08-22 MED ORDER — DEXAMETHASONE SODIUM PHOSPHATE 10 MG/ML IJ SOLN: 10.0000 mg | Freq: Once | INTRAMUSCULAR | Status: DC

## 2023-08-22 MED ORDER — DEXAMETHASONE SODIUM PHOSPHATE 10 MG/ML IJ SOLN
INTRAMUSCULAR | Status: AC
  Filled 2023-08-22: qty 1

## 2023-08-22 MED ORDER — LIDOCAINE HCL 2 % IJ SOLN
INTRAMUSCULAR | Status: AC
  Filled 2023-08-22: qty 20

## 2023-08-22 NOTE — Progress Notes (Signed)
PROVIDER NOTE: Interpretation of information contained herein should be left to medically-trained personnel. Specific patient instructions are provided elsewhere under "Patient Instructions" section of medical record. This document was created in part using STT-dictation technology, any transcriptional errors that may result from this process are unintentional.  Patient: Tammy Lamb Type: Established DOB: 09-22-1950 MRN: [id number] PCP: Enid Baas, MD  Service: Procedure DOS: 08/22/2023 Setting: Ambulatory Location: Ambulatory outpatient facility Delivery: Face-to-face Provider: Edward Jolly, MD Specialty: Interventional Pain Management Specialty designation: 09 Location: Outpatient facility Ref. Prov.: Enid Baas, MD       Interventional Therapy   Procedure:               Type: Genicular Nerves Block (Superolateral, Superomedial, and Inferomedial Genicular Nerves)  #1  Laterality: Left (-LT)  Level: Superior and inferior to the knee joint.  Imaging: Fluoroscopic guidance Anesthesia: Local anesthesia (1-2% Lidocaine) DOS: 08/22/2023  Performed by: Edward Jolly, MD  Purpose: Diagnostic/Therapeutic Indications: Chronic knee pain severe enough to impact quality of life or function. Rationale (medical necessity): procedure needed and proper for the diagnosis and/or treatment of Tammy Lamb's medical symptoms and needs. 1. Chronic pain of left knee   2. Primary osteoarthritis of left knee    NAS-11 Pain score:   Pre-procedure: 8 /10   Post-procedure: 8  (patient is experiencing some shooting pain in the left leg, outer just below knee to the foot.)/10     Target: For Genicular Nerve block(s), the targets are: the superolateral genicular nerve, located in the lateral distal portion of the femoral shaft as it curves to form the lateral epicondyle, in the region of the distal femoral metaphysis; the superomedial genicular nerve, located in the medial distal portion of the  femoral shaft as it curves to form the medial epicondyle; and the inferomedial genicular nerve, located in the medial, proximal portion of the tibial shaft, as it curves to form the medial epicondyle, in the region of the proximal tibial metaphysis.  Location: Superolateral, Superomedial, and Inferomedial aspects of knee joint.  Region: Lateral, Anterior, and Medial aspects of the knee joint, above and below the knee joint proper. Approach: Percutaneous  Type of procedure: Percutaneous perineural nerve block. The genicular nerve block is a motor-sparing technique that anesthetizes the sensory terminal branches innervating the knee joint, resulting in anesthesia of the anterior compartment of the knee. The distribution of anesthesia of each nerve is mostly in the corresponding quadrant.  Neuroanatomy: The superolateral genicular nerve (SLGN) courses around the femur shaft to pass between the vastus lateralis and the lateral epicondyle. It accompanies the superior lateral genicular artery. The superomedial genicular nerve (SMGN) courses around the femur shaft, following the superior medial genicular artery, to pass between the adductor magnus tendon and the medial epicondyle below the vastus medialis. The inferolateral genicular nerve (ILGN) courses around the tibial lateral epicondyle deep to the lateral collateral ligament, following the inferior lateral genicular artery, superior of the fibula head. The inferomedial genicular nerve (IMGN) courses horizontally below the medial collateral ligament between the tibial medial epicondyle and the insertion of the collateral ligament. It accompanies the inferior medial genicular artery. The recurrent peroneal nerve originates in the inferior popliteal region from the common peroneal nerve and courses horizontally around the fibula to pass just inferior of the fibula head and travel superior to the anterolateral tibial epicondyle. It accompanies the recurrent tibial  artery.  Position / Prep / Materials:  Position: Supine, Modified Fowler's position with pillows under the targeted knee(s). The patient  is placed in a supine position with the knee slightly flexed by placing a pillow in the popliteal fossa. Prep solution: ChloraPrep (2% chlorhexidine gluconate and 70% isopropyl alcohol) Prep Area: Entire knee area, from mid-thigh to mid-shin, lateral, anterior, and medial aspects. Materials:  Tray: Block Needle(s):  Type: Spinal  Gauge (G): 22  Length: 3.5-in  Qty: 3  H&P (Pre-op Assessment):  Tammy Lamb is a 73 y.o. (year old), female patient, seen today for interventional treatment. She  has a past surgical history that includes Tonsillectomy; Cholecystectomy; Hysterectomy abdominal with salpingo-oophorectomy; Diagnostic laparoscopy; Bladder suspension; Colonoscopy; Knee arthroscopy (Left, 02/13/2014); Ankle arthroscopy (Left, 03/22/2016); Tendon repair (Left, 03/22/2016); Lumbar laminectomy/decompression microdiscectomy (N/A, 05/30/2017); Anterior cervical decomp/discectomy fusion (N/A, 04/09/2019); Lumbar laminectomy/decompression microdiscectomy (N/A, 05/27/2021); LEFT HEART CATH AND CORONARY ANGIOGRAPHY (N/A, 10/18/2022); CORONARY STENT INTERVENTION (N/A, 10/18/2022); Esophagogastroduodenoscopy (egd) with propofol (N/A, 06/06/2023); Xi robotic assisted paraesophageal hernia repair (N/A, 06/19/2023); and Insertion of mesh (06/19/2023). [name] has a current medication list which includes the following prescription(s): acetaminophen, albuterol, ascorbic acid, aspirin ec, atorvastatin, breztri aerosphere, cephalexin, cholecalciferol, cinnamon, citalopram, clopidogrel, duloxetine, feeding supplement, gabapentin, hydrocodone-acetaminophen, [START ON 09/05/2023] hydrocodone-acetaminophen, [START ON 10/05/2023] hydrocodone-acetaminophen, hydroxyzine, metoprolol tartrate, multiple vitamins-minerals, nitroglycerin, fish oil, oxybutynin, pantoprazole, pentosan  polysulfate, sulfasalazine, tizanidine, torsemide, turmeric, valsartan, and cyanocobalamin, and the following Facility-Administered Medications: albuterol and dexamethasone. Her primarily concern today is the Knee Pain (left) and Back Pain (lower)  Initial Vital Signs:  Pulse/HCG Rate: (!) 58  Temp: (!) 96.8 F (36 C) Resp: 16 BP: 131/72 SpO2: 99 %  BMI: Estimated body mass index is 32.05 kg/m as calculated from the following:   Height as of this encounter: 5\' 5"  (1.651 m).   Weight as of this encounter: 192 lb 9.6 oz (87.4 kg).  Risk Assessment: Allergies: Reviewed. She is allergic to aspirin and sulfa antibiotics.  Allergy Precautions: None required Coagulopathies: Reviewed. None identified.  Blood-thinner therapy: None at this time Active Infection(s): Reviewed. None identified. Ms. Schlagel is afebrile  Site Confirmation: Ms. Brunke was asked to confirm the procedure and laterality before marking the site Procedure checklist: Completed Consent: Before the procedure and under the influence of no sedative(s), amnesic(s), or anxiolytics, the patient was informed of the treatment options, risks and possible complications. To fulfill our ethical and legal obligations, as recommended by the American Medical Association's Code of Ethics, I have informed the patient of my clinical impression; the nature and purpose of the treatment or procedure; the risks, benefits, and possible complications of the intervention; the alternatives, including doing nothing; the risk(s) and benefit(s) of the alternative treatment(s) or procedure(s); and the risk(s) and benefit(s) of doing nothing. The patient was provided information about the general risks and possible complications associated with the procedure. These may include, but are not limited to: failure to achieve desired goals, infection, bleeding, organ or nerve damage, allergic reactions, paralysis, and death. In addition, the patient was informed of  those risks and complications associated to the procedure, such as failure to decrease pain; infection; bleeding; organ or nerve damage with subsequent damage to sensory, motor, and/or autonomic systems, resulting in permanent pain, numbness, and/or weakness of one or several areas of the body; allergic reactions; (i.e.: anaphylactic reaction); and/or death. Furthermore, the patient was informed of those risks and complications associated with the medications. These include, but are not limited to: allergic reactions (i.e.: anaphylactic or anaphylactoid reaction(s)); adrenal axis suppression; blood sugar elevation that in diabetics may result in ketoacidosis or comma; water retention that in patients  with history of congestive heart failure may result in shortness of breath, pulmonary edema, and decompensation with resultant heart failure; weight gain; swelling or edema; medication-induced neural toxicity; particulate matter embolism and blood vessel occlusion with resultant organ, and/or nervous system infarction; and/or aseptic necrosis of one or more joints. Finally, the patient was informed that Medicine is not an exact science; therefore, there is also the possibility of unforeseen or unpredictable risks and/or possible complications that may result in a catastrophic outcome. The patient indicated having understood very clearly. We have given the patient no guarantees and we have made no promises. Enough time was given to the patient to ask questions, all of which were answered to the patient's satisfaction. Ms. Petrea has indicated that she wanted to continue with the procedure. Attestation: I, the ordering provider, attest that I have discussed with the patient the benefits, risks, side-effects, alternatives, likelihood of achieving goals, and potential problems during recovery for the procedure that I have provided informed consent. Date  Time: 08/22/2023 10:06 AM  Pre-Procedure Preparation:   Monitoring: As per clinic protocol. Respiration, ETCO2, SpO2, BP, heart rate and rhythm monitor placed and checked for adequate function Safety Precautions: Patient was assessed for positional comfort and pressure points before starting the procedure. Time-out: I initiated and conducted the "Time-out" before starting the procedure, as per protocol. The patient was asked to participate by confirming the accuracy of the "Time Out" information. Verification of the correct person, site, and procedure were performed and confirmed by me, the nursing staff, and the patient. "Time-out" conducted as per Joint Commission's Universal Protocol (UP.01.01.01). Time: 1034 Start Time: 1034 hrs.  Description/Narrative of Procedure:          Rationale (medical necessity): procedure needed and proper for the diagnosis and/or treatment of the patient's medical symptoms and needs. Procedural Technique Safety Precautions: Aspiration looking for blood return was conducted prior to all injections. At no point did we inject any substances, as a needle was being advanced. No attempts were made at seeking any paresthesias. Safe injection practices and needle disposal techniques used. Medications properly checked for expiration dates. SDV (single dose vial) medications used. Description of the Procedure: Protocol guidelines were followed. The patient was assisted into a comfortable position. The target area was identified and the area prepped in the usual manner. Skin & deeper tissues infiltrated with local anesthetic. Appropriate amount of time allowed to pass for local anesthetics to take effect. The procedure needles were then advanced to the target area. Proper needle placement secured. Negative aspiration confirmed. Solution injected in intermittent fashion, asking for systemic symptoms every 0.5cc of injectate. The needles were then removed and the area cleansed, making sure to leave some of the prepping solution back to take  advantage of its long term bactericidal properties.             Vitals:   08/22/23 1016 08/22/23 1032 08/22/23 1040 08/22/23 1047  BP: 131/72 (!) 141/90 (!) 140/88 (!) 143/79  Pulse: (!) 58 (!) 54 (!) 52 (!) 58  Resp: 16 17 16 20   Temp: (!) 96.8 F (36 C)     TempSrc: Temporal     SpO2: 99% 100% 100% 96%  Weight: 192 lb 9.6 oz (87.4 kg)     Height: 5\' 5"  (1.651 m)        Start Time: 1034 hrs. End Time: 1037 hrs.  Imaging Guidance (Non-Spinal):          Type of Imaging Technique: Fluoroscopy Guidance (Non-Spinal)  Indication(s): Fluoroscopy guidance for needle placement to enhance accuracy in procedures requiring precise needle localization for targeted delivery of medication in or near specific anatomical locations not easily accessible without such real-time imaging assistance. Exposure Time: Please see nurses notes. Contrast: None used. Fluoroscopic Guidance: I was personally present during the use of fluoroscopy. "Tunnel Vision Technique" used to obtain the best possible view of the target area. Parallax error corrected before commencing the procedure. "Direction-depth-direction" technique used to introduce the needle under continuous pulsed fluoroscopy. Once target was reached, antero-posterior, oblique, and lateral fluoroscopic projection used confirm needle placement in all planes. Images permanently stored in EMR. Interpretation: No contrast injected. I personally interpreted the imaging intraoperatively. Adequate needle placement confirmed in multiple planes. Permanent images saved into the patient's record.  Post-operative Assessment:  Post-procedure Vital Signs:  Pulse/HCG Rate: (!) 58  Temp: (!) 96.8 F (36 C) Resp: 20 BP: (!) 143/79 SpO2: 96 %  EBL: None  Complications: No immediate post-treatment complications observed by team, or reported by patient.  Note: The patient tolerated the entire procedure well. A repeat set of vitals were taken after the procedure  and the patient was kept under observation following institutional policy, for this type of procedure. Post-procedural neurological assessment was performed, showing return to baseline, prior to discharge. The patient was provided with post-procedure discharge instructions, including a section on how to identify potential problems. Should any problems arise concerning this procedure, the patient was given instructions to immediately contact us, at any time, without hesitation. In any case, we plan to contact the patient by telephone for a follow-up status report regarding this interventional procedure.  Comments:  No additional relevant information.  Plan of Care (POC)  Orders:  Orders Placed This Encounter  Procedures   DG PAIN CLIN[facility] C-ARM 1-60 MIN NO REPORT    Intraoperative interpretation by procedural physician at [facility].    Standing Status:   Standing    Number of Occurrences:   1    Reason for exam::   Assistance in needle guidance and placement for procedures requiring needle placement in or near specific anatomical locations not easily accessible without such assistance.   Chronic Opioid Analgesic:   Hydrocodone 7.5mg  TID prn 90/month     Medications ordered for procedure: Meds ordered this encounter  Medications   lidocaine (XYLOCAINE) 2 % (with pres) injection 400 mg   ropivacaine (PF) 2 mg/mL (0.2%) (NAROPIN) injection 9 mL   DISCONTD: triamcinolone acetonide (KENALOG-40) injection 40 mg   dexamethasone (DECADRON) injection 10 mg   Medications administered: We administered lidocaine and ropivacaine (PF) 2 mg/mL (0.2%).  See the medical record for exact dosing, route, and time of administration.  Follow-up plan:   Return in about 4 weeks (around 09/19/2023) for PPE, VV.       Recent Visits Date Type Provider Dept  07/31/23 Office Visit Edward Jolly, MD [facility]  06/26/23 Office Visit Edward Jolly, MD [facility]  Showing recent  visits within past 90 days and meeting all other requirements Today's Visits Date Type Provider Dept  08/22/23 Procedure visit Edward Jolly, MD [facility]  Showing today's visits and meeting all other requirements Future Appointments Date Type Provider Dept  09/26/23 Appointment Edward Jolly, MD [facility]  11/01/23 Appointment Edward Jolly, MD [facility]  Showing future appointments within next 90 days and meeting all other requirements  Disposition: Discharge home  Discharge (Date  Time): 08/22/2023; 1046 hrs.   Primary Care  Physician: Enid Baas, MD Location: [facility] Note by: Edward Jolly, MD (TTS technology used. I apologize for any typographical errors that were not detected and corrected.) Date: 08/22/2023; Time: 11:03 AM  Disclaimer:  Medicine is not an Visual merchandiser. The only guarantee in medicine is that nothing is guaranteed. It is important to note that the decision to proceed with this intervention was based on the information collected from the patient. The Data and conclusions were drawn from the patient's questionnaire, the interview, and the physical examination. Because the information was provided in large part by the patient, it cannot be guaranteed that it has not been purposely or unconsciously manipulated. Every effort has been made to obtain as much relevant data as possible for this evaluation. It is important to note that the conclusions that lead to this procedure are derived in large part from the available data. Always take into account that the treatment will also be dependent on availability of resources and existing treatment guidelines, considered by other Pain Management Practitioners as being common knowledge and practice, at the time of the intervention. For Medico-Legal purposes, it is also important to point out that variation in procedural techniques and pharmacological choices are  the acceptable norm. The indications, contraindications, technique, and results of the above procedure should only be interpreted and judged by a Board-Certified Interventional Pain Specialist with extensive familiarity and expertise in the same exact procedure and technique.

## 2023-08-22 NOTE — Patient Instructions (Signed)

## 2023-08-22 NOTE — Progress Notes (Signed)
Safety precautions to be maintained throughout the outpatient stay will include: orient to surroundings, keep bed in low position, maintain call bell within reach at all times, provide assistance with transfer out of bed and ambulation.

## 2023-08-22 NOTE — Progress Notes (Signed)
Took patient to [facility] for po fluids and crackers via wheelchair. "It hurts so Bad"   report given  to MD

## 2023-08-23 ENCOUNTER — Telehealth: Payer: Self-pay

## 2023-08-23 NOTE — Telephone Encounter (Signed)
Post procedure follow up.  Patient states she is doing good.

## 2023-08-24 ENCOUNTER — Ambulatory Visit: Payer: Medicare Other | Admitting: Cardiology

## 2023-08-27 ENCOUNTER — Telehealth: Payer: Self-pay | Admitting: Student in an Organized Health Care Education/Training Program

## 2023-08-27 NOTE — Telephone Encounter (Signed)
PT called stated that she is still having pain in her left knee and behind the knee as well. PT asked if she just needed to wait to discuss the pain at her next appt. PT also asked if it's anything that can be done. Please give patient a call. TY

## 2023-08-28 ENCOUNTER — Telehealth: Payer: Self-pay | Admitting: Student in an Organized Health Care Education/Training Program

## 2023-08-28 NOTE — Telephone Encounter (Signed)
 Dr Marcelino, patient had a GNB on 1/30.She called and states that she is still having the same pain and pain in the back of knee and wanted to know if there was anything else she can do at this time. I tried to explain to her that it may take some time for the steroid to start working but she still wanted you to know. I told her if Yyou had anything else to offer her that I would call her back.

## 2023-08-28 NOTE — Telephone Encounter (Signed)
Patient is having same pain as before her injections. Also bruising. Please call asap. She left a message yesterday but did not get a call back

## 2023-08-29 ENCOUNTER — Telehealth: Payer: Self-pay

## 2023-08-29 NOTE — Telephone Encounter (Signed)
 Offerred patient a Toradol /Robaxin  injection.  States she will come in tomorrow around 0830.Please put her on the schedule

## 2023-08-30 ENCOUNTER — Ambulatory Visit
Payer: Medicare Other | Attending: Student in an Organized Health Care Education/Training Program | Admitting: Student in an Organized Health Care Education/Training Program

## 2023-08-30 ENCOUNTER — Encounter: Payer: Self-pay | Admitting: Student in an Organized Health Care Education/Training Program

## 2023-08-30 VITALS — BP 124/82 | HR 53 | Temp 97.0°F | Resp 16 | Ht 65.5 in | Wt 196.0 lb

## 2023-08-30 DIAGNOSIS — M4692 Unspecified inflammatory spondylopathy, cervical region: Secondary | ICD-10-CM | POA: Insufficient documentation

## 2023-08-30 DIAGNOSIS — G894 Chronic pain syndrome: Secondary | ICD-10-CM | POA: Diagnosis present

## 2023-08-30 MED ORDER — METHOCARBAMOL 1000 MG/10ML IJ SOLN
200.0000 mg | Freq: Once | INTRAMUSCULAR | Status: AC
  Administered 2023-08-30: 1000 mg via INTRAMUSCULAR
  Filled 2023-08-30: qty 10

## 2023-08-30 MED ORDER — KETOROLAC TROMETHAMINE 30 MG/ML IJ SOLN
30.0000 mg | Freq: Once | INTRAMUSCULAR | Status: AC
  Administered 2023-08-30: 30 mg via INTRAMUSCULAR
  Filled 2023-08-30: qty 1

## 2023-08-30 NOTE — Progress Notes (Signed)
 Safety precautions to be maintained throughout the outpatient stay will include: orient to surroundings, keep bed in low position, maintain call bell within reach at all times, provide assistance with transfer out of bed and ambulation.

## 2023-08-30 NOTE — Progress Notes (Deleted)
 Patient: Tammy Lamb  Service Category: E/M  Provider: Wallie Sherry, MD  DOB: 1951/07/07  DOS: 08/30/2023  Referring Provider: Sherial Bail, MD  MRN: [id number]  Specialty: Interventional Pain Management  PCP: Sherial Bail, MD  Type: Established Patient  Setting: Ambulatory outpatient    Location: Office  Delivery: Face-to-face     @[id number] @ *** BP 124/82   Pulse (!) 53   Temp (!) 97 F (36.1 C)   Resp 16   Ht 5' 5.5 (1.664 m)   Wt 196 lb (88.9 kg)   SpO2 100%   BMI 32.12 kg/m  Assessment & Plan  Primary Diagnosis & Pertinent Problem List: The primary encounter diagnosis was Chronic pain syndrome. A diagnosis of Inflammatory spondylopathy of cervical region [facility]) was also pertinent to this visit.  Visit Diagnosis: 1. Chronic pain syndrome   2. Inflammatory spondylopathy of cervical region [facility])    Plan of Care  Orders:  No orders of the defined types were placed in this encounter.  Chronic Opioid Analgesic:   Hydrocodone  7.5mg  TID prn 90/month     Medications administered: We administered ketorolac  and methocarbamol .  See the medical record for exact dosing, route, and time of administration.  Follow-up plan:   No follow-ups on file.       {There is no content from the last Plan section.}    Recent Visits Date Type Provider Dept  08/22/23 Procedure visit Sherry Wallie, MD [facility]  07/31/23 Office Visit Sherry Wallie, MD [facility]  06/26/23 Office Visit Sherry Wallie, MD [facility]  Showing recent visits within past 90 days and meeting all other requirements Today's Visits Date Type Provider Dept  08/30/23 Procedure visit Sherry Wallie, MD [facility]  Showing today's visits and meeting all other requirements Future Appointments Date Type Provider Dept  09/26/23 Appointment Sherry Wallie, MD [facility]  11/01/23 Appointment Sherry Wallie, MD [facility]  Showing future  appointments within next 90 days and meeting all other requirements  Disposition: Discharge home  Discharge (Date  Time): 08/30/2023; 0846 hrs.   Primary Care Physician: Sherial Bail, MD Location: [facility] Note by: Wallie Sherry, MD (TTS technology used. I apologize for any typographical errors that were not detected and corrected.) Date: 08/30/2023; Time: 8:57 AM

## 2023-08-30 NOTE — Progress Notes (Signed)
 PROVIDER NOTE: Information contained herein reflects review and annotations entered in association with encounter. Interpretation of such information and data should be left to medically-trained personnel. Information provided to patient can be located elsewhere in the medical record under Patient Instructions. Document created using STT-dictation technology, any transcriptional errors that may result from process are unintentional.    Patient: Tammy Lamb  Service Category: E/M  Provider: Wallie Sherry, MD  DOB: 12-Jan-1951  DOS: 08/30/2023  Referring Provider: Sherial Bail, MD  MRN: [id number]  Specialty: Interventional Pain Management  PCP: Sherial Bail, MD  Type: Established Patient  Setting: Ambulatory outpatient    Location: Office  Delivery: Face-to-face     HPI  [name], a 73 y.o. year old female, is here today because of her Chronic pain syndrome [G89.4]. [name] complain today is Knee Pain (left)  Pertinent problems: 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 Pain Assessment: Severity of Chronic pain is reported as a 8 /10. Location: Knee  /up to left thigh and down to left foot. Onset: More than a month ago. Quality: Aching, Shooting. Timing: Constant. Modifying factor(s): rest. Vitals:  height is 5' 5.5 (1.664 m) and weight is 196 lb (88.9 kg). Her temperature is 97 F (36.1 C) (abnormal). Her blood pressure is 124/82 and her pulse is 53 (abnormal). Her respiration is 16 and  oxygen saturation is 100%.  BMI: Estimated body mass index is 32.12 kg/m as calculated from the following:   Height as of this encounter: 5' 5.5 (1.664 m).   Weight as of this encounter: 196 lb (88.9 kg). Last encounter: 07/31/2023. Last procedure: 08/22/2023.  Reason for encounter: Nursing IM injection      Pharmacotherapy Assessment  Analgesic:  Hydrocodone  7.5mg  TID prn 90/month     Monitoring: [place] PMP: PDMP not reviewed this encounter.       Pharmacotherapy: No side-effects or adverse reactions reported. Compliance: No problems identified. Effectiveness: Clinically acceptable.  Dayna Pulling, RN  08/30/2023  8:27 AM  Sign when Signing Visit Safety precautions to be maintained throughout the outpatient stay will include: orient to surroundings, keep bed in low position, maintain call bell within reach at all times, provide assistance with transfer out of bed and ambulation.     No results found for: CBDTHCR No results found for: D8THCCBX No results found for: D9THCCBX  UDS:  Summary  Date Value Ref Range Status  09/28/2022 Note  Final    Comment:    ==================================================================== [employer] [facility] (MW) ==================================================================== Test                             Result       Flag       Units  Drug Present and Declared for Prescription Verification   Hydrocodone                     280          EXPECTED   ng/mg creat   Norhydrocodone                 979  EXPECTED   ng/mg creat    Sources of hydrocodone  include scheduled prescription medications.    Norhydrocodone is an expected metabolite of hydrocodone .  ==================================================================== Test                      Result    Flag   Units      Ref Range   Creatinine              61               mg/dL      >=79 ==================================================================== Declared  Medications:  The flagging and interpretation on this report are based on the  following declared medications.  Unexpected results may arise from  inaccuracies in the declared medications.   **Note: The testing scope of this panel includes these medications:   Hydrocodone  (Norco)   **Note: The testing scope of this panel does not include the  following reported medications:   Acetaminophen  (Tylenol )  Acetaminophen  (Norco)  Albuterol  (Ventolin  HFA)  Amlodipine  (Norvasc )  Atorvastatin  (Lipitor )  Cephalexin  (Keflex )  Cinnamon  Citalopram  (Celexa )  Clonidine (Catapres)  Duloxetine  (Cymbalta )  Fish Oil  Fluticasone  (Advair)  Gabapentin  (Neurontin )  Hydrochlorothiazide  (Hydrodiuril )  Hydroxyzine  (Atarax )  Magnesium  (Mag-Ox)  Omeprazole   Oxybutynin  (Ditropan )  Ramipril  (Altace )  Salmeterol (Advair)  Sodium Bicarbonate  Sulfasalazine  (Azulfidine )  Tizanidine  (Zanaflex )  Valsartan (Diovan)  Vitamin B12  Vitamin C  Vitamin D3 ==================================================================== For clinical consultation, please call [phone]. ====================================================================        Medication Review  Budeson-Glycopyrrol-Formoterol , Cinnamon, DULoxetine , Fish Oil, HYDROcodone -acetaminophen , Multiple Vitamins-Minerals, Turmeric, acetaminophen , albuterol , ascorbic acid , aspirin  EC, atorvastatin , cephALEXin , cholecalciferol , citalopram , clopidogrel , cyanocobalamin , feeding supplement, gabapentin , hydrOXYzine , metoprolol  tartrate, nitroGLYCERIN , oxybutynin , pantoprazole , pentosan polysulfate, sulfaSALAzine , tiZANidine , torsemide , and valsartan  History Review  Allergy: [name] is allergic to aspirin  and sulfa antibiotics. Drug: [name]  reports no history of drug use. Alcohol:  reports that she does not currently use alcohol. Tobacco:  reports that she quit smoking about 11 months ago. Her smoking use included cigarettes. She started  smoking about 57 years ago. She has a 56.2 pack-year smoking history. She has been exposed to tobacco smoke. She has never used smokeless tobacco. Social: Ms. Storie  reports that she quit smoking about 11 months ago. Her smoking use included cigarettes. She started smoking about 57 years ago. She has a 56.2 pack-year smoking history. She has been exposed to tobacco smoke. She has never used smokeless tobacco. She reports that she does not currently use alcohol. She reports that she does not use drugs. Medical:  has a past medical history of Anemia, Anxiety, Aortic atherosclerosis (HCC), Arthritis, Bilateral carotid artery stenosis, CAD (coronary artery disease), native coronary artery (10/05/2022), Cerebral microvascular disease, Chronic lower back pain, Colitis, ulcerative (HCC), COPD (chronic obstructive pulmonary disease) (HCC), DDD (degenerative disc disease), cervical, Depression, Diastolic dysfunction (10/19/2022), Dyspnea, GERD (gastroesophageal reflux disease), Hiatal hernia, History of 2019 novel coronavirus disease (COVID-19) (02/2021), HLD (hyperlipidemia), Hypertension, Interstitial cystitis, Long-term use of aspirin  therapy, Lumbar disc disease, Motion sickness, On chronic clopidogrel  therapy, Paraesophageal hernia, Parkinson's disease (HCC), Pulmonary hypertension (HCC) (04/18/2021), PVD (peripheral vascular disease) (HCC), T2DM (type 2 diabetes mellitus) (HCC), TIA (transient ischemic attack) (2015), Urinary frequency, and Wears dentures. Surgical: Ms. Lillo  has a past surgical history that includes Tonsillectomy; Cholecystectomy; Hysterectomy abdominal with salpingo-oophorectomy; Diagnostic laparoscopy; Bladder suspension; Colonoscopy; Knee arthroscopy (Left, 02/13/2014); Ankle arthroscopy (Left, 03/22/2016); Tendon repair (Left, 03/22/2016); Lumbar laminectomy/decompression microdiscectomy (N/A, 05/30/2017); Anterior cervical  decomp/discectomy fusion (N/A, 04/09/2019); Lumbar  laminectomy/decompression microdiscectomy (N/A, 05/27/2021); LEFT HEART CATH AND CORONARY ANGIOGRAPHY (N/A, 10/18/2022); CORONARY STENT INTERVENTION (N/A, 10/18/2022); Esophagogastroduodenoscopy (egd) with propofol  (N/A, 06/06/2023); Xi robotic assisted paraesophageal hernia repair (N/A, 06/19/2023); and Insertion of mesh (06/19/2023). Family: family history includes COPD in her father; Diabetes in her mother; Heart disease in her father; Hypertension in her father and mother.  Laboratory Chemistry Profile   Renal Lab Results  Component Value Date   BUN 8 06/23/2023   CREATININE 0.74 06/23/2023   GFRAA >60 04/02/2019   GFRNONAA >60 06/23/2023    Hepatic Lab Results  Component Value Date   AST 61 (H) 06/22/2023   ALT 40 06/22/2023   ALBUMIN  3.1 (L) 06/22/2023   ALKPHOS 36 (L) 06/22/2023    Electrolytes Lab Results  Component Value Date   NA 134 (L) 06/23/2023   K 3.8 06/23/2023   CL 99 06/23/2023   CALCIUM  9.0 06/23/2023   MG 1.9 06/23/2023    Bone No results found for: VD25OH, VD125OH2TOT, CI6874NY7, CI7874NY7, 25OHVITD1, 25OHVITD2, 25OHVITD3, TESTOFREE, TESTOSTERONE  Inflammation (CRP: Acute Phase) (ESR: Chronic Phase) No results found for: CRP, ESRSEDRATE, LATICACIDVEN       Note: Above Lab results reviewed.    Assessment   Diagnosis  1. Chronic pain syndrome   2. Inflammatory spondylopathy of cervical region [facility])        Ms. Candelaria Pies Baehr has a current medication list which includes the following long-term medication(s): albuterol , atorvastatin , duloxetine , gabapentin , metoprolol  tartrate, nitroglycerin , pantoprazole , sulfasalazine , tizanidine , torsemide , and valsartan.  Pharmacotherapy (Medications Ordered): Meds ordered this encounter  Medications   ketorolac  (TORADOL ) 30 MG/ML injection 30 mg   methocarbamol  (ROBAXIN ) injection 200 mg   Orders:  No orders of the defined types were placed in this encounter.  Follow-up plan:    Return for Keep sch. appt.     Recent Visits Date Type Provider Dept  08/22/23 Procedure visit Marcelino Nurse, MD [facility]  07/31/23 Office Visit Marcelino Nurse, MD [facility]  06/26/23 Office Visit Marcelino Nurse, MD [facility]  Showing recent visits within past 90 days and meeting all other requirements Today's Visits Date Type Provider Dept  08/30/23 Procedure visit Marcelino Nurse, MD [facility]  Showing today's visits and meeting all other requirements Future Appointments Date Type Provider Dept  09/26/23 Appointment Marcelino Nurse, MD [facility]  11/01/23 Appointment Marcelino Nurse, MD [facility]  Showing future appointments within next 90 days and meeting all other requirements    Note by: Nurse Marcelino, MD Date: 08/30/2023; Time: 9:28 AM

## 2023-09-10 ENCOUNTER — Encounter: Payer: Medicare Other | Admitting: Surgery

## 2023-09-14 ENCOUNTER — Other Ambulatory Visit: Payer: Self-pay

## 2023-09-14 MED ORDER — TORSEMIDE 20 MG PO TABS: 20.0000 mg | ORAL_TABLET | Freq: Every day | ORAL | 0 refills | Status: DC | PRN

## 2023-09-14 NOTE — Telephone Encounter (Signed)
Requested Prescriptions   Signed Prescriptions Disp Refills   torsemide (DEMADEX) 20 MG tablet 90 tablet 0    Sig: Take 1 tablet (20 mg total) by mouth daily as needed.    Authorizing Provider: Debbe Odea    Ordering User: Guerry Minors

## 2023-09-25 NOTE — Progress Notes (Unsigned)
 Referring Physician:  Reinaldo Berber, MD [address] [place],  [place] [address]  Primary Physician:  Enid Baas, MD  History of Present Illness: 09/26/2023 Tammy Lamb is here today with a chief complaint of significant left knee pain.  Its mostly around her knee area.  Sometimes it will radiate into her foot.  But the most of her symptoms are around the level of the knee.  She states that it feels swollen.  Her whole leg bothers her sometimes.  She states that it is very tender to the touch.  Sometimes if she standing for a long time it will get numb.  If she is sleeping sometimes it will wake her from her sleep.  She has not had any recent EMG but did proximately 2 to 3 years ago.  She has had multiple spinal surgeries.  She has been under the care of orthopedics for evaluation of possible knee issues.  Review of Systems:  A 10 point review of systems is negative, except for the pertinent positives and negatives detailed in the HPI.  Past Medical History: Past Medical History:  Diagnosis Date   Anemia    Anxiety    Aortic atherosclerosis (HCC)    Arthritis    Bilateral carotid artery stenosis    CAD (coronary artery disease), native coronary artery 10/05/2022   a.) cCTA 10/05/2022: Ca2+ = 601 (92nd %ile); LAD 383, RCA 217; b.) LHC 10/18/2022: 85% pRCA ( 3.5 x 15 mm Onyx Frontier DES)   Cerebral microvascular disease    Chronic lower back pain    Colitis, ulcerative (HCC)    COPD (chronic obstructive pulmonary disease) (HCC)    DDD (degenerative disc disease), cervical    a.) s/p ACDF C3-C4 04/09/2019   Depression    Diastolic dysfunction 10/19/2022   a.) TTE 10/19/2022: EF 60-65%, no RWMAs,  G1DD, normal RVSF; b.) TTE 12/04/2022: EF >55%. no RWMAs, G1DD, mild LAE, triv AR/MR/PR, mod TR, PASP 45   Dyspnea    GERD (gastroesophageal reflux disease)    Hiatal hernia    History of 2019 novel coronavirus disease (COVID-19) 02/2021   HLD (hyperlipidemia)     Hypertension    Interstitial cystitis    Long-term use of aspirin therapy    Lumbar disc disease    a.) s/p L3-L5 laminectomy, decompression, and microdiscectomy 05/30/2017; b.) s/p L4-S1 decompression 05/27/2021   Motion sickness    fair rides   On chronic clopidogrel therapy    Paraesophageal hernia    Parkinson's disease (HCC)    suspected per dr Sherryll Burger @ [facility] neuro   Pulmonary hypertension (HCC) 04/18/2021   a.) TTE 04/18/2021: PASP 42.9; b.) TTE 12/04/2022: PASP 45   PVD (peripheral vascular disease) (HCC)    T2DM (type 2 diabetes mellitus) (HCC)    TIA (transient ischemic attack) 2015   a.) no deficits; per pt her carvedilol dose was too high causing hypotension   Urinary frequency    Wears dentures    partial upper and lower    Past Surgical History: Past Surgical History:  Procedure Laterality Date   ANKLE ARTHROSCOPY Left 03/22/2016   Procedure: ANKLE ARTHROSCOPY DEBRODEMENT EXTENSIVE LEFT FLEXAR TENDON REPAIR;  Surgeon: Gwyneth Revels, DPM;  Location: [facility];  Service: Podiatry;  Laterality: Left;  WITH POPLITEAL   ANTERIOR CERVICAL DECOMP/DISCECTOMY FUSION N/A 04/09/2019   Procedure: ANTERIOR CERVICAL DECOMPRESSION/DISCECTOMY FUSION 1 LEVEL C3-4;  Surgeon: Venetia Night, MD;  Location: [facility];  Service: Neurosurgery;  Laterality: N/A;  BLADDER SUSPENSION     CHOLECYSTECTOMY     COLONOSCOPY     CORONARY STENT INTERVENTION N/A 10/18/2022   Procedure: CORONARY STENT INTERVENTION;  Surgeon: Iran Ouch, MD;  Location: [facility];  Service: Cardiovascular;  Laterality: N/A;   DIAGNOSTIC LAPAROSCOPY     adhesions   ESOPHAGOGASTRODUODENOSCOPY (EGD) WITH PROPOFOL N/A 06/06/2023   Procedure: ESOPHAGOGASTRODUODENOSCOPY (EGD) WITH PROPOFOL;  Surgeon: Sung Amabile, DO;  Location: [facility];  Service: General;  Laterality: N/A;  patient to stay on plavix and aspirin due to recent stent placement, Dr. Tonna Boehringer aware   HYSTERECTOMY ABDOMINAL WITH  SALPINGO-OOPHORECTOMY     INSERTION OF MESH  06/19/2023   Procedure: INSERTION OF MESH;  Surgeon: Leafy Ro, MD;  Location: [facility];  Service: General;;  paraesophageal hernia repair   KNEE ARTHROSCOPY Left 02/13/2014   Procedure: LEFT ARTHROSCOPY KNEE, PARTIAL MEDIAL MENISECTOMY AND PLICA, CHONDROPLASTY OF PATELLA FEMORAL JOINT;  Surgeon: Harvie Junior, MD;  Location: [facility];  Service: Orthopedics;  Laterality: Left;   LEFT HEART CATH AND CORONARY ANGIOGRAPHY N/A 10/18/2022   Procedure: LEFT HEART CATH AND CORONARY ANGIOGRAPHY;  Surgeon: Iran Ouch, MD;  Location: [facility];  Service: Cardiovascular;  Laterality: N/A;   LUMBAR LAMINECTOMY/DECOMPRESSION MICRODISCECTOMY N/A 05/30/2017   Procedure: LUMBAR LAMINECTOMY/DECOMPRESSION MICRODISCECTOMY 3 LEVELS-L3-S1;  Surgeon: Venetia Night, MD;  Location: [facility];  Service: Neurosurgery;  Laterality: N/A;   LUMBAR LAMINECTOMY/DECOMPRESSION MICRODISCECTOMY N/A 05/27/2021   Procedure: L4-S1 DECOMPRESSION;  Surgeon: Venetia Night, MD;  Location: [facility];  Service: Neurosurgery;  Laterality: N/A;   TENDON REPAIR Left 03/22/2016   Procedure: ZOXWRU TENDON REPAIR;  Surgeon: Gwyneth Revels, DPM;  Location: [facility];  Service: Podiatry;  Laterality: Left;   TONSILLECTOMY     XI ROBOTIC ASSISTED PARAESOPHAGEAL HERNIA REPAIR N/A 06/19/2023   Procedure: XI ROBOTIC ASSISTED PARAESOPHAGEAL HERNIA REPAIR, RNFA to assist;  Surgeon: Leafy Ro, MD;  Location: [facility];  Service: General;  Laterality: N/A;    Allergies: Allergies as of 09/26/2023 - Review Complete 09/26/2023  Allergen Reaction Noted   Aspirin Other (See Comments) 12/15/2020   Sulfa antibiotics Nausea And Vomiting 02/10/2014    Medications:  Current Outpatient Medications:    acetaminophen (TYLENOL) 500 MG tablet, Take 1,000 mg by mouth every 6 (six) hours as needed for moderate pain or headache., Disp: , Rfl:    ascorbic acid  (VITAMIN C) 1000 MG tablet, Take 1,000 mg by mouth once a week., Disp: , Rfl:    aspirin EC 81 MG tablet, Take 1 tablet (81 mg total) by mouth daily. Swallow whole., Disp: 30 tablet, Rfl: 12   atorvastatin (LIPITOR) 80 MG tablet, Take 80 mg by mouth at bedtime., Disp: , Rfl:    Budeson-Glycopyrrol-Formoterol (BREZTRI AEROSPHERE) 160-9-4.8 MCG/ACT AERO, Inhale 2 puffs into the lungs in the morning and at bedtime., Disp: 17.7 g, Rfl: 0   cephALEXin (KEFLEX) 250 MG capsule, Take 250 mg by mouth daily., Disp: , Rfl:    cholecalciferol (VITAMIN D) 25 MCG (1000 UNIT) tablet, Take 1,000 Units by mouth at bedtime., Disp: , Rfl:    Cinnamon 500 MG TABS, Take 500 mg by mouth in the morning and at bedtime., Disp: , Rfl:    citalopram (CELEXA) 20 MG tablet, Take 20 mg by mouth every morning., Disp: , Rfl:    clopidogrel (PLAVIX) 75 MG tablet, Take 1 tablet (75 mg total) by mouth daily with breakfast., Disp: 30 tablet, Rfl: 11  DULoxetine (CYMBALTA) 60 MG capsule, Take 60 mg by mouth daily., Disp: , Rfl:    feeding supplement (ENSURE ENLIVE / ENSURE PLUS) LIQD, Take 237 mLs by mouth 2 (two) times daily between meals., Disp: 237 mL, Rfl: 12   gabapentin (NEURONTIN) 300 MG capsule, Take 3 capsules (900 mg total) by mouth 2 (two) times daily., Disp: 180 capsule, Rfl: 5   HYDROcodone-acetaminophen (NORCO) 7.5-325 MG tablet, Take 1 tablet by mouth every 8 (eight) hours as needed for moderate pain (pain score 4-6)., Disp: 90 tablet, Rfl: 0   [START ON 10/05/2023] HYDROcodone-acetaminophen (NORCO) 7.5-325 MG tablet, Take 1 tablet by mouth every 8 (eight) hours as needed for moderate pain (pain score 4-6)., Disp: 90 tablet, Rfl: 0   hydrOXYzine (ATARAX/VISTARIL) 25 MG tablet, Take 25 mg by mouth at bedtime., Disp: , Rfl:    metoprolol tartrate (LOPRESSOR) 25 MG tablet, Take 1 tablet (25 mg total) by mouth 2 (two) times daily., Disp: 180 tablet, Rfl: 0   Multiple Vitamins-Minerals (HAIR SKIN NAILS PO), Take 1 tablet by  mouth in the morning and at bedtime., Disp: , Rfl:    nitroGLYCERIN (NITROSTAT) 0.4 MG SL tablet, Place 1 tablet (0.4 mg total) under the tongue every 5 (five) minutes as needed for chest pain., Disp: 90 tablet, Rfl: 3   Omega-3 Fatty Acids (FISH OIL) 1200 MG CAPS, Take 1,200 mg by mouth once a week., Disp: , Rfl:    oxybutynin (DITROPAN-XL) 10 MG 24 hr tablet, Take 10 mg by mouth at bedtime., Disp: , Rfl:    pantoprazole (PROTONIX) 40 MG tablet, Take 1 tablet (40 mg total) by mouth daily., Disp: 30 tablet, Rfl: 0   pentosan polysulfate (ELMIRON) 100 MG capsule, Take 100 mg by mouth 2 (two) times daily., Disp: , Rfl:    sulfaSALAzine (AZULFIDINE) 500 MG tablet, Take 500 mg by mouth 2 (two) times daily., Disp: , Rfl:    tiZANidine (ZANAFLEX) 4 MG tablet, Take 1 tablet (4 mg total) by mouth every 12 (twelve) hours as needed., Disp: 60 tablet, Rfl: 5   torsemide (DEMADEX) 20 MG tablet, Take 1 tablet (20 mg total) by mouth daily as needed., Disp: 90 tablet, Rfl: 0   TURMERIC CURCUMIN PO, Take 1 capsule by mouth daily., Disp: , Rfl:    valsartan (DIOVAN) 160 MG tablet, Take 160 mg by mouth daily., Disp: , Rfl:    vitamin B-12 (CYANOCOBALAMIN) 1000 MCG tablet, Take 1,000 mcg by mouth daily., Disp: , Rfl:    albuterol (VENTOLIN HFA) 108 (90 Base) MCG/ACT inhaler, Inhale 2 puffs into the lungs every 6 (six) hours as needed for wheezing or shortness of breath. (Patient not taking: Reported on 09/26/2023), Disp: 8 g, Rfl: 6 No current facility-administered medications for this visit.  Facility-Administered Medications Ordered in Other Visits:    albuterol (PROVENTIL) (2.5 MG/3ML) 0.083% nebulizer solution 2.5 mg, 2.5 mg, Nebulization, Once, Salena Saner, MD  Social History: Social History   Tobacco Use   Smoking status: Former    Current packs/day: 0.00    Average packs/day: 1 pack/day for 56.2 years (56.2 ttl pk-yrs)    Types: Cigarettes    Start date: 16    Quit date: 09/22/2022    Years  since quitting: 1.0    Passive exposure: Past   Smokeless tobacco: Never   Tobacco comments:    Quit in March of 2024.  Vaping Use   Vaping status: Never Used  Substance Use Topics   Alcohol use: Not Currently  Comment: Occassional,   Drug use: No    Family Medical History: Family History  Problem Relation Age of Onset   Diabetes Mother    Hypertension Mother    Hypertension Father    COPD Father    Heart disease Father    Breast cancer Neg Hx     Physical Examination: Vitals:   09/26/23 1349  BP: (!) 156/90    General: Patient is in no apparent distress. Attention to examination is appropriate.  Neck:   Supple.  Full range of motion.  Respiratory: Patient is breathing without any difficulty.   NEUROLOGICAL:     Awake, alert, oriented to person, place, and time.  Speech is clear and fluent.   Cranial Nerves: Pupils equal round and reactive to light.  Facial tone is symmetric. Shoulder shrug is symmetric. Tongue protrusion is midline.  There is no pronator drift.  Motor Exam:  She has a difficult clinical exam to grade formally as she does have a significant amount of pain around the passive range of motion in her knee.  She is able to lift her leg and extend her knee and flex her knee also extend and flex her ankles.  She is at least 4 out of 5 throughout, however again quite limited by pain.  She does have 2+ reflexes at the bilateral patellas, 1+ at the bilateral Achilles.  She states that she does feel swollen are slightly decreased sensation at the knee circumferentially.   Medical Decision Making  Imaging: Narrative & Impression  CLINICAL DATA:  Chronic and worsening low back pain radiating into the left leg.   EXAM: MRI LUMBAR SPINE WITHOUT CONTRAST   TECHNIQUE: Multiplanar, multisequence MR imaging of the lumbar spine was performed. No intravenous contrast was administered.   COMPARISON:  Report of lumbar spine MRI 04/04/2021. Images  were requested but are not available. Lumbar spine MRI 08/29/2017. Plain films lumbar spine 12/27/2021.   FINDINGS: Segmentation:  Standard.   Alignment:  Normal.   Vertebrae:  No fracture, evidence of discitis, or bone lesion.   Conus medullaris and cauda equina: Conus extends to the L1 level. Conus and cauda equina appear normal.   Paraspinal and other soft tissues: Negative.   Disc levels:   T11-12 is imaged in the sagittal plane only and negative.   T12-L1: Shallow central and right paracentral protrusion without stenosis.   L1-2: Mild facet arthropathy and a shallow disc bulge to the right. No stenosis.   L2-3: Shallow disc bulge and moderate facet arthropathy. Left laminotomy defect is identified. No stenosis.   L3-4: Left laminotomy defects again seen. Mild-to-moderate facet arthropathy is worse on the left. There is a shallow disc bulge without stenosis.   L4-5: Status post left laminotomy. There is a shallow disc bulge and mild-to-moderate facet arthropathy. No stenosis.   L5-S1: Shallow disc bulge and endplate spur. The central canal and right foramen are open. Moderate left foraminal stenosis appears unchanged.   IMPRESSION: 1. No new abnormality. 2. Status post left laminotomy at L2-3, L3-4 and L4-5. No central canal stenosis at any level. 3. Moderate left foraminal narrowing at L5-S1 appears unchanged.     Electronically Signed   By: Drusilla Kanner M.D.   On: 08/20/2023 09:25    Electrodiagnostics: No recent EDX  I have personally reviewed the images and electrodiagnostics and agree with the above interpretation.  Assessment and Plan: Ms. Tamargo is a pleasant 73 y.o. female with an extensive lumbar spine history who comes in  today for evaluation of her left knee pain.  There is question about whether and is this was orthopedic or neurologic in nature.  She states that most of her pain is around the left knee.  Sometimes it can radiate up from  her back or buttocks but however most of the time her knee feels swollen and decree sensation.  She states that she feels some generalized weakness in the left lower extremity and this been going on for some time.  On physical examination she has pain limitation but does not show any major deficit or muscle wasting.  Her reflexes are intact at the bilateral patellas, decreased at the bilateral Achilles.  Her MRI demonstrates some stenosis on the left L5-S1 which is not clearly localizing to her current symptomatology aside more likely think this to be from the L3 level if coming from the spine.  However intact patellar reflexes argue against this.  In regards to her pain moving forward she has severe pain to palpation at many points around her knee that are not clearly reproducible.  In cases such as these I do find that a repeat EMG/nerve conduction study can be helpful in helping [place] localize her issues.  We do know she has chronic lumbar issues however the loss of sensation at the knee and incredible hypersensitivity in and around the knee area is difficult to attribute to the low back.  Will plan on getting an EMG and nerve conduction study and following up with her to see whether or not there is any neuropathic causes for her current issues.  We like to follow-up with her afterwards.  Thank you for involving me in the care of this patient.    Lovenia Kim MD/MSCR Neurosurgery - Peripheral Nerve Surgery    Spent a total of 30 minutes reviewing her chart, reviewing outside records, face-to-face evaluation, and coordination of her care going forward.

## 2023-09-26 ENCOUNTER — Encounter: Payer: Self-pay | Admitting: Neurosurgery

## 2023-09-26 ENCOUNTER — Ambulatory Visit (INDEPENDENT_AMBULATORY_CARE_PROVIDER_SITE_OTHER): Payer: Medicare Other | Admitting: Neurosurgery

## 2023-09-26 ENCOUNTER — Ambulatory Visit
Payer: Medicare Other | Attending: Student in an Organized Health Care Education/Training Program | Admitting: Student in an Organized Health Care Education/Training Program

## 2023-09-26 VITALS — BP 156/90 | Ht 65.5 in | Wt 198.0 lb

## 2023-09-26 DIAGNOSIS — G894 Chronic pain syndrome: Secondary | ICD-10-CM

## 2023-09-26 DIAGNOSIS — M5416 Radiculopathy, lumbar region: Secondary | ICD-10-CM

## 2023-09-26 DIAGNOSIS — M5414 Radiculopathy, thoracic region: Secondary | ICD-10-CM

## 2023-09-26 DIAGNOSIS — M1712 Unilateral primary osteoarthritis, left knee: Secondary | ICD-10-CM | POA: Diagnosis not present

## 2023-09-26 DIAGNOSIS — R29898 Other symptoms and signs involving the musculoskeletal system: Secondary | ICD-10-CM

## 2023-09-26 DIAGNOSIS — M4692 Unspecified inflammatory spondylopathy, cervical region: Secondary | ICD-10-CM | POA: Diagnosis not present

## 2023-09-26 DIAGNOSIS — M25562 Pain in left knee: Secondary | ICD-10-CM

## 2023-09-26 DIAGNOSIS — G8929 Other chronic pain: Secondary | ICD-10-CM

## 2023-09-26 DIAGNOSIS — M48061 Spinal stenosis, lumbar region without neurogenic claudication: Secondary | ICD-10-CM

## 2023-09-26 NOTE — Progress Notes (Signed)
 Patient: Tammy Lamb  Service Category: E/M  Provider: Edward Jolly, MD  DOB: 05-Oct-1950  DOS: 09/26/2023  Location: Office  MRN: [id number]  Setting: Ambulatory outpatient  Referring Provider: Enid Baas, MD  Type: Established Patient  Specialty: Interventional Pain Management  PCP: Enid Baas, MD  Location: Remote location  Delivery: TeleHealth     Virtual Encounter - Pain Management PROVIDER NOTE: Information contained herein reflects review and annotations entered in association with encounter. Interpretation of such information and data should be left to medically-trained personnel. Information provided to patient can be located elsewhere in the medical record under "Patient Instructions". Document created using STT-dictation technology, any transcriptional errors that may result from process are unintentional.    Contact & Pharmacy Preferred: [phone] Home: [phone] (home) Mobile: [phone] (mobile) E-mail: Tammy Lamb  [id number] [place], [place] - [address] [facility] AT [facility] [address] [place] [address] Phone: [phone] Fax: [phone]  [facility] [address] [place], [place] - [address] [place] [address] Phone: [phone] Fax: [phone]   Pre-screening  Tammy Lamb offered "in-person" vs "virtual" encounter. She indicated preferring virtual for this encounter.   Reason COVID-19*  Social distancing based on CDC and AMA recommendations.   I contacted Abbi D Amer on 09/26/2023 via telephone.      I clearly identified myself as Edward Jolly, MD. I verified that I was speaking with the correct person using two identifiers (Name: JESENIA SPERA, and date of birth: 1951/05/04).  Consent I sought verbal advanced consent from Tammy Lamb for virtual visit interactions. I informed Tammy Lamb of possible security and privacy concerns, risks, and limitations associated with  providing "not-in-person" medical evaluation and management services. I also informed Tammy Lamb of the availability of "in-person" appointments. Finally, I informed her that there would be a charge for the virtual visit and that she could be  personally, fully or partially, financially responsible for it. Tammy Lamb expressed understanding and agreed to proceed.   Historic Elements   Tammy Lamb is a 73 y.o. year old, female patient evaluated today after our last contact on 08/30/2023. Tammy Lamb  has a past medical history of Anemia, Anxiety, Aortic atherosclerosis (HCC), Arthritis, Bilateral carotid artery stenosis, CAD (coronary artery disease), native coronary artery (10/05/2022), Cerebral microvascular disease, Chronic lower back pain, Colitis, ulcerative (HCC), COPD (chronic obstructive pulmonary disease) (HCC), DDD (degenerative disc disease), cervical, Depression, Diastolic dysfunction (10/19/2022), Dyspnea, GERD (gastroesophageal reflux disease), Hiatal hernia, History of 2019 novel coronavirus disease (COVID-19) (02/2021), HLD (hyperlipidemia), Hypertension, Interstitial cystitis, Long-term use of aspirin therapy, Lumbar disc disease, Motion sickness, On chronic clopidogrel therapy, Paraesophageal hernia, Parkinson's disease (HCC), Pulmonary hypertension (HCC) (04/18/2021), PVD (peripheral vascular disease) (HCC), T2DM (type 2 diabetes mellitus) (HCC), TIA (transient ischemic attack) (2015), Urinary frequency, and Wears dentures. She also  has a past surgical history that includes Tonsillectomy; Cholecystectomy; Hysterectomy abdominal with salpingo-oophorectomy; Diagnostic laparoscopy; Bladder suspension; Colonoscopy; Knee arthroscopy (Left, 02/13/2014); Ankle arthroscopy (Left, 03/22/2016); Tendon repair (Left, 03/22/2016); Lumbar laminectomy/decompression microdiscectomy (N/A, 05/30/2017); Anterior cervical decomp/discectomy fusion (N/A, 04/09/2019); Lumbar laminectomy/decompression microdiscectomy  (N/A, 05/27/2021); LEFT HEART CATH AND CORONARY ANGIOGRAPHY (N/A, 10/18/2022); CORONARY STENT INTERVENTION (N/A, 10/18/2022); Esophagogastroduodenoscopy (egd) with propofol (N/A, 06/06/2023); Xi robotic assisted paraesophageal hernia repair (N/A, 06/19/2023); and Insertion of mesh (06/19/2023). Tammy Lamb has a current medication list which includes the following prescription(s): acetaminophen, albuterol, ascorbic acid, aspirin ec, atorvastatin, breztri aerosphere, cephalexin, cholecalciferol, cinnamon, citalopram,  clopidogrel, feeding supplement, gabapentin, hydrocodone-acetaminophen, [START ON 10/05/2023] hydrocodone-acetaminophen, hydroxyzine, metoprolol tartrate, multiple vitamins-minerals, fish oil, oxybutynin, pantoprazole, pentosan polysulfate, sulfasalazine, tizanidine, torsemide, turmeric, valsartan, cyanocobalamin, duloxetine, and nitroglycerin, and the following Facility-Administered Medications: albuterol. She  reports that she quit smoking about a year ago. Her smoking use included cigarettes. She started smoking about 57 years ago. She has a 56.2 pack-year smoking history. She has been exposed to tobacco smoke. She has never used smokeless tobacco. She reports that she does not currently use alcohol. She reports that she does not use drugs. Tammy Lamb is allergic to aspirin and sulfa antibiotics.  BMI: Estimated body mass index is 32.45 kg/m as calculated from the following:   Height as of an earlier encounter on 09/26/23: 5' 5.5" (1.664 m).   Weight as of an earlier encounter on 09/26/23: 198 lb (89.8 kg). Last encounter: 07/31/2023. Last procedure: 08/30/2023.  HPI  Today, she is being contacted for a post-procedure assessment.   Post-procedure evaluation    Type: Genicular Nerves Block (Superolateral, Superomedial, and Inferomedial Genicular Nerves)  #1  Laterality: Left (-LT)  Level: Superior and inferior to the knee joint.  Imaging: Fluoroscopic guidance Anesthesia: Local anesthesia (1-2%  Lidocaine) DOS: 08/22/2023  Performed by: Edward Jolly, MD  Purpose: Diagnostic/Therapeutic Indications: Chronic knee pain severe enough to impact quality of life or function. Rationale (medical necessity): procedure needed and proper for the diagnosis and/or treatment of Tammy Lamb's medical symptoms and needs. 1. Chronic pain of left knee   2. Primary osteoarthritis of left knee    NAS-11 Pain score:   Pre-procedure: 8 /10   Post-procedure: 8  (patient is experiencing some shooting pain in the left leg, outer just below knee to the foot.)/10     Effectiveness:  Initial hour after procedure: 0 %  Subsequent 4-6 hours post-procedure: 0 %  Analgesia past initial 6 hours: 0 %    Laboratory Chemistry Profile   Renal Lab Results  Component Value Date   BUN 8 06/23/2023   CREATININE 0.74 06/23/2023   GFRAA >60 04/02/2019   GFRNONAA >60 06/23/2023    Hepatic Lab Results  Component Value Date   AST 61 (H) 06/22/2023   ALT 40 06/22/2023   ALBUMIN 3.1 (L) 06/22/2023   ALKPHOS 36 (L) 06/22/2023    Electrolytes Lab Results  Component Value Date   NA 134 (L) 06/23/2023   K 3.8 06/23/2023   CL 99 06/23/2023   CALCIUM 9.0 06/23/2023   MG 1.9 06/23/2023    Bone No results found for: "VD25OH", "VD125OH2TOT", "IO9629BM8", "UX3244WN0", "25OHVITD1", "25OHVITD2", "25OHVITD3", "TESTOFREE", "TESTOSTERONE"  Inflammation (CRP: Acute Phase) (ESR: Chronic Phase) No results found for: "CRP", "ESRSEDRATE", "LATICACIDVEN"       Note: Above Lab results reviewed.    Assessment  The primary encounter diagnosis was Chronic pain syndrome. Diagnoses of Inflammatory spondylopathy of cervical region [facility]), Chronic pain of left knee, Primary osteoarthritis of left knee, Radicular pain of thoracic region, and Lumbar radiculopathy were also pertinent to this visit.  Plan of Care  No benefit with genicular block Will continue to monitor symptoms Saw Dr Katrinka Blazing today, likely having NCV/EMG  Could be  a candidate for SCS- will discuss further at her next visit Return of MM on 11/01/23  Follow-up plan:   Return for Keep sch. appt.     Recent Visits Date Type Provider Dept  08/30/23 Procedure visit Edward Jolly, MD [facility]  08/22/23 Procedure visit Edward Jolly, MD [facility]  07/31/23 Office  Visit Edward Jolly, MD [facility]  Showing recent visits within past 90 days and meeting all other requirements Today's Visits Date Type Provider Dept  09/26/23 Office Visit Edward Jolly, MD [facility]  Showing today's visits and meeting all other requirements Future Appointments Date Type Provider Dept  11/01/23 Appointment Edward Jolly, MD [facility]  Showing future appointments within next 90 days and meeting all other requirements  I discussed the assessment and treatment plan with the patient. The patient was provided an opportunity to ask questions and all were answered. The patient agreed with the plan and demonstrated an understanding of the instructions.  Patient advised to call back or seek an in-person evaluation if the symptoms or condition worsens.  Duration of encounter: 15 minutes.  Note by: Edward Jolly, MD Date: 09/26/2023; Time: 4:26 PM

## 2023-09-28 ENCOUNTER — Encounter: Payer: Self-pay | Admitting: Cardiology

## 2023-09-28 ENCOUNTER — Ambulatory Visit: Payer: Medicare Other | Attending: Cardiology | Admitting: Cardiology

## 2023-09-28 VITALS — BP 136/89 | HR 52 | Ht 65.0 in | Wt 198.0 lb

## 2023-09-28 DIAGNOSIS — I10 Essential (primary) hypertension: Secondary | ICD-10-CM | POA: Diagnosis present

## 2023-09-28 DIAGNOSIS — I2510 Atherosclerotic heart disease of native coronary artery without angina pectoris: Secondary | ICD-10-CM | POA: Insufficient documentation

## 2023-09-28 NOTE — Patient Instructions (Signed)
 Medication Instructions:  Your physician recommends the following medication changes.  STOP TAKING: Plavix  *If you need a refill on your cardiac medications before your next appointment, please call your pharmacy*   Lab Work: None ordered at this time   Follow-Up: At [facility], you and your health needs are our priority.  As part of our continuing mission to provide you with exceptional heart care, we have created designated Provider Care Teams.  These Care Teams include your primary Cardiologist (physician) and Advanced Practice Providers (APPs -  Physician Assistants and Nurse Practitioners) who all work together to provide you with the care you need, when you need it.   Your next appointment:   1 year(s)  Provider:   You may see Debbe Odea, MD or one of the following Advanced Practice Providers on your designated Care Team:   Nicolasa Ducking, NP Eula Listen, PA-C Cadence Fransico Michael, PA-C Charlsie Quest, NP Carlos Levering, NP

## 2023-09-28 NOTE — Progress Notes (Signed)
 Cardiology Office Note:    Date:  09/28/2023   ID:  Tammy Lamb, DOB 1950-09-25, MRN [id number]  PCP:  Enid Baas, MD   [facility] Providers Cardiologist:  Debbe Odea, MD     Referring MD: Enid Baas, MD   Chief Complaint  Patient presents with   Follow-up    6 month follow up visit. Patient states that she is experiencing a shock like feeling in the center of her chest that began a couple of weeks ago. Meds reviewed.     History of Present Illness:    Tammy Lamb is a 73 y.o. female with a hx of  CAD s/p PCI proximal RCA 3/24, hypertension, hyperlipidemia, former smoker x 40+ years, COPD who presents for follow-up.  Has occasional sharp chest pain, not associated with exertion.  Endorse neck, back and leg pains.  Recently had hiatal hernia repair.  Chest pain not associated with food intake.  Complains of easy bruisability, currently on aspirin and Plavix.  Prior notes Echo 09/2022 EF 60 to 65%, aortic valve sclerosis.   Past Medical History:  Diagnosis Date   Anemia    Anxiety    Aortic atherosclerosis (HCC)    Arthritis    Bilateral carotid artery stenosis    CAD (coronary artery disease), native coronary artery 10/05/2022   a.) cCTA 10/05/2022: Ca2+ = 601 (92nd %ile); LAD 383, RCA 217; b.) LHC 10/18/2022: 85% pRCA ( 3.5 x 15 mm Onyx Frontier DES)   Cerebral microvascular disease    Chronic lower back pain    Colitis, ulcerative (HCC)    COPD (chronic obstructive pulmonary disease) (HCC)    DDD (degenerative disc disease), cervical    a.) s/p ACDF C3-C4 04/09/2019   Depression    Diastolic dysfunction 10/19/2022   a.) TTE 10/19/2022: EF 60-65%, no RWMAs,  G1DD, normal RVSF; b.) TTE 12/04/2022: EF >55%. no RWMAs, G1DD, mild LAE, triv AR/MR/PR, mod TR, PASP 45   Dyspnea    GERD (gastroesophageal reflux disease)    Hiatal hernia    History of 2019 novel coronavirus disease (COVID-19) 02/2021   HLD (hyperlipidemia)    Hypertension     Interstitial cystitis    Long-term use of aspirin therapy    Lumbar disc disease    a.) s/p L3-L5 laminectomy, decompression, and microdiscectomy 05/30/2017; b.) s/p L4-S1 decompression 05/27/2021   Motion sickness    fair rides   On chronic clopidogrel therapy    Paraesophageal hernia    Parkinson's disease (HCC)    suspected per dr Sherryll Burger @ [facility] neuro   Pulmonary hypertension (HCC) 04/18/2021   a.) TTE 04/18/2021: PASP 42.9; b.) TTE 12/04/2022: PASP 45   PVD (peripheral vascular disease) (HCC)    T2DM (type 2 diabetes mellitus) (HCC)    TIA (transient ischemic attack) 2015   a.) no deficits; per pt her carvedilol dose was too high causing hypotension   Urinary frequency    Wears dentures    partial upper and lower    Past Surgical History:  Procedure Laterality Date   ANKLE ARTHROSCOPY Left 03/22/2016   Procedure: ANKLE ARTHROSCOPY DEBRODEMENT EXTENSIVE LEFT FLEXAR TENDON REPAIR;  Surgeon: Gwyneth Revels, DPM;  [facility];  Service: Podiatry;  Laterality: Left;  WITH POPLITEAL   ANTERIOR CERVICAL DECOMP/DISCECTOMY FUSION N/A 04/09/2019   Procedure: ANTERIOR CERVICAL DECOMPRESSION/DISCECTOMY FUSION 1 LEVEL C3-4;  Surgeon: Venetia Night, MD;  Location: [facility];  Service: Neurosurgery;  Laterality: N/A;   BLADDER SUSPENSION  CHOLECYSTECTOMY     COLONOSCOPY     CORONARY STENT INTERVENTION N/A 10/18/2022   Procedure: CORONARY STENT INTERVENTION;  Surgeon: Iran Ouch, MD;  Location: [facility];  Service: Cardiovascular;  Laterality: N/A;   DIAGNOSTIC LAPAROSCOPY     adhesions   ESOPHAGOGASTRODUODENOSCOPY (EGD) WITH PROPOFOL N/A 06/06/2023   Procedure: ESOPHAGOGASTRODUODENOSCOPY (EGD) WITH PROPOFOL;  Surgeon: Sung Amabile, DO;  Location: [facility];  Service: General;  Laterality: N/A;  patient to stay on plavix and aspirin due to recent stent placement, Dr. Tonna Boehringer aware   HYSTERECTOMY ABDOMINAL WITH SALPINGO-OOPHORECTOMY     INSERTION OF  MESH  06/19/2023   Procedure: INSERTION OF MESH;  Surgeon: Leafy Ro, MD;  Location: [facility];  Service: General;;  paraesophageal hernia repair   KNEE ARTHROSCOPY Left 02/13/2014   Procedure: LEFT ARTHROSCOPY KNEE, PARTIAL MEDIAL MENISECTOMY AND PLICA, CHONDROPLASTY OF PATELLA FEMORAL JOINT;  Surgeon: Harvie Junior, MD;  Location: [facility];  Service: Orthopedics;  Laterality: Left;   LEFT HEART CATH AND CORONARY ANGIOGRAPHY N/A 10/18/2022   Procedure: LEFT HEART CATH AND CORONARY ANGIOGRAPHY;  Surgeon: Iran Ouch, MD;  Location: [facility];  Service: Cardiovascular;  Laterality: N/A;   LUMBAR LAMINECTOMY/DECOMPRESSION MICRODISCECTOMY N/A 05/30/2017   Procedure: LUMBAR LAMINECTOMY/DECOMPRESSION MICRODISCECTOMY 3 LEVELS-L3-S1;  Surgeon: Venetia Night, MD;  Location: [facility];  Service: Neurosurgery;  Laterality: N/A;   LUMBAR LAMINECTOMY/DECOMPRESSION MICRODISCECTOMY N/A 05/27/2021   Procedure: L4-S1 DECOMPRESSION;  Surgeon: Venetia Night, MD;  Location: [facility];  Service: Neurosurgery;  Laterality: N/A;   TENDON REPAIR Left 03/22/2016   Procedure: RUEAVW TENDON REPAIR;  Surgeon: Gwyneth Revels, DPM;  Location: [facility];  Service: Podiatry;  Laterality: Left;   TONSILLECTOMY     XI ROBOTIC ASSISTED PARAESOPHAGEAL HERNIA REPAIR N/A 06/19/2023   Procedure: XI ROBOTIC ASSISTED PARAESOPHAGEAL HERNIA REPAIR, RNFA to assist;  Surgeon: Leafy Ro, MD;  Location: [facility];  Service: General;  Laterality: N/A;    Current Medications: Current Meds  Medication Sig   acetaminophen (TYLENOL) 500 MG tablet Take 1,000 mg by mouth every 6 (six) hours as needed for moderate pain or headache.   albuterol (VENTOLIN HFA) 108 (90 Base) MCG/ACT inhaler Inhale 2 puffs into the lungs every 6 (six) hours as needed for wheezing or shortness of breath.   ascorbic acid (VITAMIN C) 1000 MG tablet Take 1,000 mg by mouth once a week.   aspirin EC 81 MG tablet  Take 1 tablet (81 mg total) by mouth daily. Swallow whole.   atorvastatin (LIPITOR) 80 MG tablet Take 80 mg by mouth at bedtime.   Budeson-Glycopyrrol-Formoterol (BREZTRI AEROSPHERE) 160-9-4.8 MCG/ACT AERO Inhale 2 puffs into the lungs in the morning and at bedtime.   cephALEXin (KEFLEX) 250 MG capsule Take 250 mg by mouth daily.   cholecalciferol (VITAMIN D) 25 MCG (1000 UNIT) tablet Take 1,000 Units by mouth at bedtime.   Cinnamon 500 MG TABS Take 500 mg by mouth in the morning and at bedtime.   citalopram (CELEXA) 20 MG tablet Take 20 mg by mouth every morning.   DULoxetine (CYMBALTA) 60 MG capsule Take 60 mg by mouth daily.   feeding supplement (ENSURE ENLIVE / ENSURE PLUS) LIQD Take 237 mLs by mouth 2 (two) times daily between meals.   gabapentin (NEURONTIN) 300 MG capsule Take 3 capsules (900 mg total) by mouth 2 (two) times daily.   HYDROcodone-acetaminophen (NORCO) 7.5-325 MG tablet Take 1 tablet by mouth every 8 (eight) hours as  needed for moderate pain (pain score 4-6).   [START ON 10/05/2023] HYDROcodone-acetaminophen (NORCO) 7.5-325 MG tablet Take 1 tablet by mouth every 8 (eight) hours as needed for moderate pain (pain score 4-6).   hydrOXYzine (ATARAX/VISTARIL) 25 MG tablet Take 25 mg by mouth at bedtime.   metoprolol tartrate (LOPRESSOR) 25 MG tablet Take 1 tablet (25 mg total) by mouth 2 (two) times daily.   Multiple Vitamins-Minerals (HAIR SKIN NAILS PO) Take 1 tablet by mouth in the morning and at bedtime.   Omega-3 Fatty Acids (FISH OIL) 1200 MG CAPS Take 1,200 mg by mouth once a week.   oxybutynin (DITROPAN-XL) 10 MG 24 hr tablet Take 10 mg by mouth at bedtime.   pantoprazole (PROTONIX) 40 MG tablet Take 1 tablet (40 mg total) by mouth daily.   pentosan polysulfate (ELMIRON) 100 MG capsule Take 100 mg by mouth 2 (two) times daily.   sulfaSALAzine (AZULFIDINE) 500 MG tablet Take 500 mg by mouth 2 (two) times daily.   tiZANidine (ZANAFLEX) 4 MG tablet Take 1 tablet (4 mg total)  by mouth every 12 (twelve) hours as needed.   torsemide (DEMADEX) 20 MG tablet Take 1 tablet (20 mg total) by mouth daily as needed.   TURMERIC CURCUMIN PO Take 1 capsule by mouth daily.   valsartan (DIOVAN) 160 MG tablet Take 160 mg by mouth daily.   vitamin B-12 (CYANOCOBALAMIN) 1000 MCG tablet Take 1,000 mcg by mouth daily.   [DISCONTINUED] clopidogrel (PLAVIX) 75 MG tablet Take 1 tablet (75 mg total) by mouth daily with breakfast.     Allergies:   Aspirin and Sulfa antibiotics   Social History   Socioeconomic History   Marital status: Married    Spouse name: Canche,Terry L (Spouse)   Number of children: Not on file   Years of education: Not on file   Highest education level: Not on file  Occupational History   Not on file  Tobacco Use   Smoking status: Former    Current packs/day: 0.00    Average packs/day: 1 pack/day for 56.2 years (56.2 ttl pk-yrs)    Types: Cigarettes    Start date: 3    Quit date: 09/22/2022    Years since quitting: 1.0    Passive exposure: Past   Smokeless tobacco: Never   Tobacco comments:    Quit in March of 2024.  Vaping Use   Vaping status: Never Used  Substance and Sexual Activity   Alcohol use: Not Currently    Comment: Occassional,   Drug use: No   Sexual activity: Never  Other Topics Concern   Not on file  Social History Narrative   Not on file   Social Drivers of Health   Financial Resource Strain: Low Risk  (07/06/2023)   Received from [facility]   Overall Financial Resource Strain (CARDIA)    Difficulty of Paying Living Expenses: Not hard at all  Food Insecurity: No Food Insecurity (07/06/2023)   Received from [facility]   Hunger Vital Sign    Ran Out of Food in the Last Year: Never true    Worried About Running Out of Food in the Last Year: Never true  Transportation Needs: No Transportation Needs (07/06/2023)   Received from [facility]   PRAPARE - Transportation     Lack of Transportation (Non-Medical): No    In the past 12 months, has lack of transportation kept you from medical appointments or from getting medications?: No  Physical Activity: Not on file  Stress: Not on file  Social Connections: Not on file     Family History: The patient's family history includes COPD in her father; Diabetes in her mother; Heart disease in her father; Hypertension in her father and mother. There is no history of Breast cancer.  ROS:   Please see the history of present illness.     All other systems reviewed and are negative.  EKGs/Labs/Other Studies Reviewed:    The following studies were reviewed today:   EKG Interpretation Date/Time:  Friday September 28 2023 11:09:11 EST Ventricular Rate:  52 PR Interval:  184 QRS Duration:  82 QT Interval:  446 QTC Calculation: 414 R Axis:   -15  Text Interpretation: Sinus bradycardia Minimal voltage criteria for LVH, may be normal variant ( R in aVL ) Confirmed by Debbe Odea ([id number]) on 09/28/2023 11:18:07 AM    Recent Labs: 10/18/2022: TSH 3.245 06/22/2023: ALT 40 06/23/2023: B Natriuretic Peptide 426.8; BUN 8; Creatinine, Ser 0.74; Hemoglobin 8.1; Magnesium 1.9; Platelets 289; Potassium 3.8; Sodium 134  Recent Lipid Panel    Component Value Date/Time   CHOL 158 10/19/2022 0541   TRIG 56 10/19/2022 0541   HDL 84 10/19/2022 0541   CHOLHDL 1.9 10/19/2022 0541   VLDL 11 10/19/2022 0541   LDLCALC 63 10/19/2022 0541     Risk Assessment/Calculations:             Physical Exam:    VS:  BP 136/89   Pulse (!) 52   Ht 5\' 5"  (1.651 m)   Wt 198 lb (89.8 kg)   SpO2 94%   BMI 32.95 kg/m     Wt Readings from Last 3 Encounters:  09/28/23 198 lb (89.8 kg)  09/26/23 198 lb (89.8 kg)  08/30/23 196 lb (88.9 kg)     GEN:  Well nourished, mild to moderate chest discomfort. HEENT: Normal NECK: No JVD; No carotid bruits CARDIAC: RRR, 1/6 systolic murmur RESPIRATORY:  Clear to auscultation without rales,  wheezing or rhonchi  ABDOMEN: Soft, non-tender, non-distended MUSCULOSKELETAL:  No edema; tenderness reproducible with palpation SKIN: Warm and dry NEUROLOGIC:  Alert and oriented x 3 PSYCHIATRIC:  Normal affect   ASSESSMENT:    1. Coronary artery disease involving native coronary artery of native heart, unspecified whether angina present   2. Primary hypertension     PLAN:    In order of problems listed above:  CAD s/p PCI to proximal RCA 3/24.  Chest pain not consistent with cardiac etiology.  Reproducible with palpation, indicating musculoskeletal etiology.  Stop Plavix due to easy bruisability.  Continue aspirin 81 mg daily,, Lipitor 80.  EF 60 to 65%.  With PCP regarding musculoskeletal pain. Hypertension, BP controlled.  Continue Norvasc 10 mg daily, valsartan 160 mg daily, Lopressor 25 mg twice daily.  Follow-up in 12 months.     Medication Adjustments/Labs and Tests Ordered: Current medicines are reviewed at length with the patient today.  Concerns regarding medicines are outlined above.  Orders Placed This Encounter  Procedures   EKG 12-Lead   No orders of the defined types were placed in this encounter.   Patient Instructions  Medication Instructions:  Your physician recommends the following medication changes.  STOP TAKING: Plavix  *If you need a refill on your cardiac medications before your next appointment, please call your pharmacy*   Lab Work: None ordered at this time   Follow-Up: At [facility], you and your health needs are our priority.  As part of our continuing mission to provide you with exceptional heart care, we have created designated Provider Care Teams.  These Care Teams include your primary Cardiologist (physician) and Advanced Practice Providers (APPs -  Physician Assistants and Nurse Practitioners) who all work together to provide you with the care you need, when you need it.   Your next appointment:   1 year(s)  Provider:    You may see Debbe Odea, MD or one of the following Advanced Practice Providers on your designated Care Team:   Nicolasa Ducking, NP Eula Listen, Tammy-C Cadence Fransico Michael, Tammy-C Charlsie Quest, NP Carlos Levering, NP   Signed, Debbe Odea, MD  09/28/2023 12:32 PM    [facility]

## 2023-10-01 ENCOUNTER — Other Ambulatory Visit: Payer: Self-pay

## 2023-10-01 ENCOUNTER — Encounter: Payer: Self-pay | Admitting: Neurology

## 2023-10-12 ENCOUNTER — Other Ambulatory Visit: Payer: Self-pay

## 2023-10-12 DIAGNOSIS — R29898 Other symptoms and signs involving the musculoskeletal system: Secondary | ICD-10-CM

## 2023-10-15 ENCOUNTER — Encounter: Admitting: Neurology

## 2023-10-24 ENCOUNTER — Ambulatory Visit (INDEPENDENT_AMBULATORY_CARE_PROVIDER_SITE_OTHER): Payer: Medicare Other | Admitting: Surgery

## 2023-10-24 ENCOUNTER — Encounter: Payer: Self-pay | Admitting: Surgery

## 2023-10-24 VITALS — BP 116/63 | HR 69 | Temp 98.3°F | Ht 65.0 in | Wt 201.0 lb

## 2023-10-24 DIAGNOSIS — Z09 Encounter for follow-up examination after completed treatment for conditions other than malignant neoplasm: Secondary | ICD-10-CM

## 2023-10-24 DIAGNOSIS — K449 Diaphragmatic hernia without obstruction or gangrene: Secondary | ICD-10-CM | POA: Diagnosis not present

## 2023-10-24 NOTE — Patient Instructions (Signed)
 Follow up as needed.   Abdominal Pain, Adult  Many things can cause belly (abdominal) pain. In most cases, belly pain is not a serious problem and can be watched and treated at home. But in some cases, it can be serious. Your doctor will try to find the cause of your belly pain. Follow these instructions at home: Medicines Take over-the-counter and prescription medicines only as told by your doctor. Do not take medicines that help you poop (laxatives) unless told by your doctor. General instructions Watch your belly pain for any changes. Tell your doctor if the pain gets worse. Drink enough fluid to keep your pee (urine) pale yellow. Contact a doctor if: Your belly pain changes or gets worse. You have very bad cramping or bloating in your belly. You vomit. Your pain gets worse with meals, after eating, or with certain foods. You have trouble pooping or have watery poop for more than 2-3 days. You are not hungry, or you lose weight without trying. You have signs of not getting enough fluid or water (dehydration). These may include: Dark pee, very little pee, or no pee. Cracked lips or dry mouth. Feeling sleepy or weak. You have pain when you pee or poop. Your belly pain wakes you up at night. You have blood in your pee. You have a fever. Get help right away if: You cannot stop vomiting. Your pain is only in one part of your belly, like on the right side. You have bloody or black poop, or poop that looks like tar. You have trouble breathing. You have chest pain. These symptoms may be an emergency. Get help right away. Call 911. Do not wait to see if the symptoms will go away. Do not drive yourself to the hospital. This information is not intended to replace advice given to you by your health care provider. Make sure you discuss any questions you have with your health care provider. Document Revised: 04/26/2022 Document Reviewed: 04/26/2022 Elsevier Patient Education  2024  [employer].

## 2023-10-25 NOTE — Progress Notes (Signed)
 Outpatient Surgical Follow Up    Tammy Lamb is an 73 y.o. female.   Chief Complaint  Patient presents with   Follow-up    Paraesophageal hernia 06/19/23    HPI: [name] is 4  1/2 month out from robotic giant paraesophageal hernia repair. O verall she has recovered well, she does have limited physiologic reserve. No dysphagia, tolerating PO She does have some intermittent constipation followed by diarrhea no fevers no chills. She does have chronic pain issues that were present before the operation  Her main complain today is that she is having Dry heaving while having a BM. I have pers. Reviewed her latest KUB showin some constipation  Past Medical History:  Diagnosis Date   Anemia    Anxiety    Aortic atherosclerosis (HCC)    Arthritis    Bilateral carotid artery stenosis    CAD (coronary artery disease), native coronary artery 10/05/2022   a.) cCTA 10/05/2022: Ca2+ = 601 (92nd %ile); LAD 383, RCA 217; b.) LHC 10/18/2022: 85% pRCA ( 3.5 x 15 mm Onyx Frontier DES)   Cerebral microvascular disease    Chronic lower back pain    Colitis, ulcerative (HCC)    COPD (chronic obstructive pulmonary disease) (HCC)    DDD (degenerative disc disease), cervical    a.) s/p ACDF C3-C4 04/09/2019   Depression    Diastolic dysfunction 10/19/2022   a.) TTE 10/19/2022: EF 60-65%, no RWMAs,  G1DD, normal RVSF; b.) TTE 12/04/2022: EF >55%. no RWMAs, G1DD, mild LAE, triv AR/MR/PR, mod TR, PASP 45   Dyspnea    GERD (gastroesophageal reflux disease)    Hiatal hernia    History of 2019 novel coronavirus disease (COVID-19) 02/2021   HLD (hyperlipidemia)    Hypertension    Interstitial cystitis    Long-term use of aspirin therapy    Lumbar disc disease    a.) s/p L3-L5 laminectomy, decompression, and microdiscectomy 05/30/2017; b.) s/p L4-S1 decompression 05/27/2021   Motion sickness    fair rides   On chronic clopidogrel therapy    Paraesophageal hernia    Parkinson's disease (HCC)     suspected per dr Sherryll Burger @ [facility] neuro   Pulmonary hypertension (HCC) 04/18/2021   a.) TTE 04/18/2021: PASP 42.9; b.) TTE 12/04/2022: PASP 45   PVD (peripheral vascular disease) (HCC)    T2DM (type 2 diabetes mellitus) (HCC)    TIA (transient ischemic attack) 2015   a.) no deficits; per pt her carvedilol dose was too high causing hypotension   Urinary frequency    Wears dentures    partial upper and lower    Past Surgical History:  Procedure Laterality Date   ANKLE ARTHROSCOPY Left 03/22/2016   Procedure: ANKLE ARTHROSCOPY DEBRODEMENT EXTENSIVE LEFT FLEXAR TENDON REPAIR;  Surgeon: Gwyneth Revels, DPM;  Location: [facility];  Service: Podiatry;  Laterality: Left;  WITH POPLITEAL   ANTERIOR CERVICAL DECOMP/DISCECTOMY FUSION N/A 04/09/2019   Procedure: ANTERIOR CERVICAL DECOMPRESSION/DISCECTOMY FUSION 1 LEVEL C3-4;  Surgeon: Venetia Night, MD;  Location: [facility];  Service: Neurosurgery;  Laterality: N/A;   BLADDER SUSPENSION     CHOLECYSTECTOMY     COLONOSCOPY     CORONARY STENT INTERVENTION N/A 10/18/2022   Procedure: CORONARY STENT INTERVENTION;  Surgeon: Iran Ouch, MD;  Location: [facility];  Service: Cardiovascular;  Laterality: N/A;   DIAGNOSTIC LAPAROSCOPY     adhesions   ESOPHAGOGASTRODUODENOSCOPY (EGD) WITH PROPOFOL N/A 06/06/2023   Procedure: ESOPHAGOGASTRODUODENOSCOPY (EGD) WITH PROPOFOL;  Surgeon: Sung Amabile, DO;  Location: [facility];  Service: General;  Laterality: N/A;  patient to stay on plavix and aspirin due to recent stent placement, Dr. Tonna Boehringer aware   HYSTERECTOMY ABDOMINAL WITH SALPINGO-OOPHORECTOMY     INSERTION OF MESH  06/19/2023   Procedure: INSERTION OF MESH;  Surgeon: Leafy Ro, MD;  Location: [facility];  Service: General;;  paraesophageal hernia repair   KNEE ARTHROSCOPY Left 02/13/2014   Procedure: LEFT ARTHROSCOPY KNEE, PARTIAL MEDIAL MENISECTOMY AND PLICA, CHONDROPLASTY OF PATELLA FEMORAL JOINT;  Surgeon: Harvie Junior, MD;   Location: [facility];  Service: Orthopedics;  Laterality: Left;   LEFT HEART CATH AND CORONARY ANGIOGRAPHY N/A 10/18/2022   Procedure: LEFT HEART CATH AND CORONARY ANGIOGRAPHY;  Surgeon: Iran Ouch, MD;  Location: [facility];  Service: Cardiovascular;  Laterality: N/A;   LUMBAR LAMINECTOMY/DECOMPRESSION MICRODISCECTOMY N/A 05/30/2017   Procedure: LUMBAR LAMINECTOMY/DECOMPRESSION MICRODISCECTOMY 3 LEVELS-L3-S1;  Surgeon: Venetia Night, MD;  Location: [facility];  Service: Neurosurgery;  Laterality: N/A;   LUMBAR LAMINECTOMY/DECOMPRESSION MICRODISCECTOMY N/A 05/27/2021   Procedure: L4-S1 DECOMPRESSION;  Surgeon: Venetia Night, MD;  Location: [facility];  Service: Neurosurgery;  Laterality: N/A;   TENDON REPAIR Left 03/22/2016   Procedure: BJYNWG TENDON REPAIR;  Surgeon: Gwyneth Revels, DPM;  Location: [facility];  Service: Podiatry;  Laterality: Left;   TONSILLECTOMY     XI ROBOTIC ASSISTED PARAESOPHAGEAL HERNIA REPAIR N/A 06/19/2023   Procedure: XI ROBOTIC ASSISTED PARAESOPHAGEAL HERNIA REPAIR, RNFA to assist;  Surgeon: Leafy Ro, MD;  Location: [facility];  Service: General;  Laterality: N/A;    Family History  Problem Relation Age of Onset   Diabetes Mother    Hypertension Mother    Hypertension Father    COPD Father    Heart disease Father    Breast cancer Neg Hx     Social History:  reports that she quit smoking about 13 months ago. Her smoking use included cigarettes. She started smoking about 57 years ago. She has a 56.2 pack-year smoking history. She has been exposed to tobacco smoke. She has never used smokeless tobacco. She reports that she does not currently use alcohol. She reports that she does not use drugs.  Allergies:  Allergies  Allergen Reactions   Aspirin Other (See Comments)    GERD   Sulfa Antibiotics Nausea And Vomiting    Medications reviewed.    ROS Full ROS performed and is otherwise negative other than  what is stated in HPI   BP 116/63   Pulse 69   Temp 98.3 F (36.8 C) (Oral)   Ht 5\' 5"  (1.651 m)   Wt 201 lb (91.2 kg)   SpO2 92%   BMI 33.45 kg/m   Physical Exam NAD walking Abd: soft, nt, incisions healed, no peritonitis or hernias Ext: well perfused, no edema Neuro: GCS 15, awake alert, no neurological deficitis Skin: no lesion , bruises significantly better    Assessment/Plan: Doing well considering her baseline I can not explain her dry heaving Recommended fiber regimen NO evidence of complications related to her surgery F/U prn I spent greater than 30 minutes in this encounter including personally reviewing imaging studies, coordinating her care, placing orders and performing documentation    Sterling Big, MD [facility]

## 2023-10-26 ENCOUNTER — Other Ambulatory Visit: Payer: Self-pay

## 2023-10-26 MED ORDER — ASPIRIN 81 MG PO TBEC: 81.0000 mg | DELAYED_RELEASE_TABLET | Freq: Every day | ORAL | 5 refills | Status: DC

## 2023-10-30 ENCOUNTER — Ambulatory Visit
Attending: Student in an Organized Health Care Education/Training Program | Admitting: Student in an Organized Health Care Education/Training Program

## 2023-10-30 ENCOUNTER — Encounter: Payer: Self-pay | Admitting: Student in an Organized Health Care Education/Training Program

## 2023-10-30 VITALS — BP 156/99 | HR 54 | Temp 97.3°F | Resp 16 | Ht 65.0 in | Wt 199.0 lb

## 2023-10-30 DIAGNOSIS — M48062 Spinal stenosis, lumbar region with neurogenic claudication: Secondary | ICD-10-CM

## 2023-10-30 DIAGNOSIS — G894 Chronic pain syndrome: Secondary | ICD-10-CM | POA: Diagnosis not present

## 2023-10-30 DIAGNOSIS — M5030 Other cervical disc degeneration, unspecified cervical region: Secondary | ICD-10-CM | POA: Diagnosis present

## 2023-10-30 DIAGNOSIS — M4692 Unspecified inflammatory spondylopathy, cervical region: Secondary | ICD-10-CM | POA: Diagnosis not present

## 2023-10-30 DIAGNOSIS — M25562 Pain in left knee: Secondary | ICD-10-CM | POA: Diagnosis present

## 2023-10-30 DIAGNOSIS — M5416 Radiculopathy, lumbar region: Secondary | ICD-10-CM | POA: Diagnosis present

## 2023-10-30 DIAGNOSIS — M1712 Unilateral primary osteoarthritis, left knee: Secondary | ICD-10-CM

## 2023-10-30 DIAGNOSIS — M5414 Radiculopathy, thoracic region: Secondary | ICD-10-CM | POA: Diagnosis present

## 2023-10-30 DIAGNOSIS — G8929 Other chronic pain: Secondary | ICD-10-CM | POA: Diagnosis present

## 2023-10-30 DIAGNOSIS — M5124 Other intervertebral disc displacement, thoracic region: Secondary | ICD-10-CM

## 2023-10-30 MED ORDER — HYDROCODONE-ACETAMINOPHEN 7.5-325 MG PO TABS: 1.0000 | ORAL_TABLET | Freq: Three times a day (TID) | ORAL | 0 refills | Status: AC | PRN

## 2023-10-30 MED ORDER — HYDROCODONE-ACETAMINOPHEN 7.5-325 MG PO TABS
1.0000 | ORAL_TABLET | Freq: Three times a day (TID) | ORAL | 0 refills | Status: DC | PRN
Start: 2023-12-10 — End: 2023-12-24

## 2023-10-30 NOTE — Progress Notes (Signed)
 Nursing Pain Medication Assessment:  Safety precautions to be maintained throughout the outpatient stay will include: orient to surroundings, keep bed in low position, maintain call bell within reach at all times, provide assistance with transfer out of bed and ambulation.  Medication Inspection Compliance: Pill count conducted under aseptic conditions, in front of the patient. Neither the pills nor the bottle was removed from the patient's sight at any time. Once count was completed pills were immediately returned to the patient in their original bottle.  Medication: Hydrocodone/APAP Pill/Patch Count:  42 of 90 pills remain Pill/Patch Appearance: Markings consistent with prescribed medication Bottle Appearance: Standard pharmacy container. Clearly labeled. Filled Date: 03 / 20 / 2025 Last Medication intake:  Today

## 2023-10-30 NOTE — Progress Notes (Signed)
 PROVIDER NOTE: Information contained herein reflects review and annotations entered in association with encounter. Interpretation of such information and data should be left to medically-trained personnel. Information provided to patient can be located elsewhere in the medical record under "Patient Instructions". Document created using STT-dictation technology, any transcriptional errors that may result from process are unintentional.    Patient: Tammy Lamb  Service Category: E/M  Provider: Edward Jolly, MD  DOB: 11-30-1950  DOS: 10/30/2023  Referring Provider: Enid Baas, MD  MRN: [id number]  Specialty: Interventional Pain Management  PCP: Enid Baas, MD  Type: Established Patient  Setting: Ambulatory outpatient    Location: Office  Delivery: Face-to-face     HPI  [name], a 73 y.o. year old female, is here today because of her Chronic pain syndrome [G89.4]. [name] primary complain today is Back Pain, Neck Pain, and Knee Pain (left)  Pertinent problems: Ms. 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 Pain Assessment: Severity of Chronic pain is reported as a 7 /10. Location: Back  /to hips bilat, down left leg to left foot. Onset: More than a month ago. Quality: Aching, Burning, Sharp. Timing: Constant. Modifying factor(s): meds. Vitals:  height is 5\' 5"  (1.651 m) and weight is 199 lb (90.3 kg). Her temperature is 97.3 F (36.3 C) (abnormal). Her blood pressure is 156/99 (abnormal) and her pulse is  54 (abnormal). Her respiration is 16 and oxygen saturation is 99%.  BMI: Estimated body mass index is 33.12 kg/m as calculated from the following:   Height as of this encounter: 5\' 5"  (1.651 m).   Weight as of this encounter: 199 lb (90.3 kg). Last encounter: 09/26/23  Reason for encounter:   Discussed the use of AI scribe software for clinical note transcription with the patient, who gave verbal consent to proceed.  History of Present Illness   The patient presents with severe hip and leg pain following a fall from a recliner.  She experienced a fall from a recliner at a friend's house in [place], caused by a child tampering with the remote control of the chair. She was unable to get up due to pain in her hip and leg, requiring assistance from three individuals to stand. The incident occurred last month.  Following the fall, she experienced severe pain that persisted for three days, during which she was in tears and considered visiting the emergency room due to the intensity of the pain. The pain was unrelenting despite her efforts to alleviate it.  She is currently taking gabapentin and has three refills available. She was recently taken off Plavix but continues to take aspirin. She also mentions having hydrocodone refilled but does not require tizanidine at this time.  She has an upcoming nerve conduction test scheduled for Monday to further evaluate her condition. Will discuss SCS after.      Pharmacotherapy Assessment  Analgesic: Hydrocodone 7.5mg  TID prn 90/month    Monitoring: [place] PMP: PDMP not reviewed this encounter.       Pharmacotherapy: No side-effects or adverse reactions reported. Compliance: No problems identified. Effectiveness: Clinically acceptable.  Nonah Mattes, RN  10/30/2023  1:16 PM  Sign when Signing  Visit Nursing Pain Medication Assessment:  Safety precautions to be maintained throughout the outpatient stay will include: orient to surroundings, keep bed in  low position, maintain call bell within reach at all times, provide assistance with transfer out of bed and ambulation.  Medication Inspection Compliance: Pill count conducted under aseptic conditions, in front of the patient. Neither the pills nor the bottle was removed from the patient's sight at any time. Once count was completed pills were immediately returned to the patient in their original bottle.  Medication: Hydrocodone/APAP Pill/Patch Count:  42 of 90 pills remain Pill/Patch Appearance: Markings consistent with prescribed medication Bottle Appearance: Standard pharmacy container. Clearly labeled. Filled Date: 03 / 20 / 2025 Last Medication intake:  Today  No results found for: "CBDTHCR" No results found for: "D8THCCBX" No results found for: "D9THCCBX"  UDS:  Summary  Date Value Ref Range Status  09/28/2022 Note  Final    Comment:    ==================================================================== [employer]ox[employer] Select 13 (MW) ==================================================================== Test                             Result       Flag       Units  Drug Present and Declared for Prescription Verification   Hydrocodone                    280          EXPECTED   ng/mg creat   Norhydrocodone                 979          EXPECTED   ng/mg creat    Sources of hydrocodone include scheduled prescription medications.    Norhydrocodone is an expected metabolite of hydrocodone.  ==================================================================== Test                      Result    Flag   Units      Ref Range   Creatinine              61               mg/dL      >=16 ==================================================================== Declared Medications:  The flagging and interpretation on this report are based on the  following declared medications.  Unexpected results may arise from  inaccuracies in the declared medications.   **Note: The testing scope of this panel  includes these medications:   Hydrocodone (Norco)   **Note: The testing scope of this panel does not include the  following reported medications:   Acetaminophen (Tylenol)  Acetaminophen (Norco)  Albuterol (Ventolin HFA)  Amlodipine (Norvasc)  Atorvastatin (Lipitor)  Cephalexin (Keflex)  Cinnamon  Citalopram (Celexa)  Clonidine (Catapres)  Duloxetine (Cymbalta)  Fish Oil  Fluticasone (Advair)  Gabapentin (Neurontin)  Hydrochlorothiazide (Hydrodiuril)  Hydroxyzine (Atarax)  Magnesium (Mag-Ox)  Omeprazole  Oxybutynin (Ditropan)  Ramipril (Altace)  Salmeterol (Advair)  Sodium Bicarbonate  Sulfasalazine (Azulfidine)  Tizanidine (Zanaflex)  Valsartan (Diovan)  Vitamin B12  Vitamin C  Vitamin D3 ==================================================================== For clinical consultation, please call [phone]. ====================================================================       ROS  Constitutional: Denies any fever or chills Gastrointestinal: No reported hemesis, hematochezia, vomiting, or acute GI distress Musculoskeletal:  low back pain with radiation to bilateral legs, left knee pain  Neurological: No reported episodes of acute onset apraxia, aphasia, dysarthria, agnosia, amnesia, paralysis, loss of coordination, or loss  of consciousness  Medication Review  Cinnamon, DULoxetine, Fish Oil, HYDROcodone-acetaminophen, Multiple Vitamins-Minerals, Turmeric, acetaminophen, albuterol, ascorbic acid, aspirin EC, atorvastatin, budeson-glycopyrrolate-formoterol, cephALEXin, cholecalciferol, citalopram, cyanocobalamin, feeding supplement, gabapentin, hydrOXYzine, metoprolol tartrate, nitroGLYCERIN, oxybutynin, pantoprazole, pentosan polysulfate, sulfaSALAzine, tiZANidine, torsemide, and valsartan  History Review  Allergy: Ms. Fasnacht is allergic to aspirin and sulfa antibiotics. Drug: Ms. Rogel  reports no history of drug use. Alcohol:  reports that she does not  currently use alcohol. Tobacco:  reports that she quit smoking about 13 months ago. Her smoking use included cigarettes. She started smoking about 57 years ago. She has a 56.2 pack-year smoking history. She has been exposed to tobacco smoke. She has never used smokeless tobacco. Social: Ms. Lish  reports that she quit smoking about 13 months ago. Her smoking use included cigarettes. She started smoking about 57 years ago. She has a 56.2 pack-year smoking history. She has been exposed to tobacco smoke. She has never used smokeless tobacco. She reports that she does not currently use alcohol. She reports that she does not use drugs. Medical:  has a past medical history of Anemia, Anxiety, Aortic atherosclerosis (HCC), Arthritis, Bilateral carotid artery stenosis, CAD (coronary artery disease), native coronary artery (10/05/2022), Cerebral microvascular disease, Chronic lower back pain, Colitis, ulcerative (HCC), COPD (chronic obstructive pulmonary disease) (HCC), DDD (degenerative disc disease), cervical, Depression, Diastolic dysfunction (10/19/2022), Dyspnea, GERD (gastroesophageal reflux disease), Hiatal hernia, History of 2019 novel coronavirus disease (COVID-19) (02/2021), HLD (hyperlipidemia), Hypertension, Interstitial cystitis, Long-term use of aspirin therapy, Lumbar disc disease, Motion sickness, On chronic clopidogrel therapy, Paraesophageal hernia, Parkinson's disease (HCC), Pulmonary hypertension (HCC) (04/18/2021), PVD (peripheral vascular disease) (HCC), T2DM (type 2 diabetes mellitus) (HCC), TIA (transient ischemic attack) (2015), Urinary frequency, and Wears dentures. Surgical: Ms. Metzger  has a past surgical history that includes Tonsillectomy; Cholecystectomy; Hysterectomy abdominal with salpingo-oophorectomy; Diagnostic laparoscopy; Bladder suspension; Colonoscopy; Knee arthroscopy (Left, 02/13/2014); Ankle arthroscopy (Left, 03/22/2016); Tendon repair (Left, 03/22/2016); Lumbar  laminectomy/decompression microdiscectomy (N/A, 05/30/2017); Anterior cervical decomp/discectomy fusion (N/A, 04/09/2019); Lumbar laminectomy/decompression microdiscectomy (N/A, 05/27/2021); LEFT HEART CATH AND CORONARY ANGIOGRAPHY (N/A, 10/18/2022); CORONARY STENT INTERVENTION (N/A, 10/18/2022); Esophagogastroduodenoscopy (egd) with propofol (N/A, 06/06/2023); Xi robotic assisted paraesophageal hernia repair (N/A, 06/19/2023); and Insertion of mesh (06/19/2023). Family: family history includes COPD in her father; Diabetes in her mother; Heart disease in her father; Hypertension in her father and mother.  Laboratory Chemistry Profile   Renal Lab Results  Component Value Date   BUN 8 06/23/2023   CREATININE 0.74 06/23/2023   GFRAA >60 04/02/2019   GFRNONAA >60 06/23/2023    Hepatic Lab Results  Component Value Date   AST 61 (H) 06/22/2023   ALT 40 06/22/2023   ALBUMIN 3.1 (L) 06/22/2023   ALKPHOS 36 (L) 06/22/2023    Electrolytes Lab Results  Component Value Date   NA 134 (L) 06/23/2023   K 3.8 06/23/2023   CL 99 06/23/2023   CALCIUM 9.0 06/23/2023   MG 1.9 06/23/2023    Bone No results found for: "VD25OH", "VD125OH2TOT", "OZ3086VH8", "IO9629BM8", "25OHVITD1", "25OHVITD2", "25OHVITD3", "TESTOFREE", "TESTOSTERONE"  Inflammation (CRP: Acute Phase) (ESR: Chronic Phase) No results found for: "CRP", "ESRSEDRATE", "LATICACIDVEN"       Note: Above Lab results reviewed.  Recent Imaging Review  DG PAIN CLINIC C-ARM 1-60 MIN NO REPORT Fluoro was used, but no Radiologist interpretation will be provided.  Please refer to "NOTES" tab for provider progress note. Note: Reviewed        Physical Exam  General appearance: Well nourished, well developed, and  well hydrated. In no apparent acute distress Mental status: Alert, oriented x 3 (person, place, & time)       Respiratory: No evidence of acute respiratory distress Eyes: PERLA Vitals: BP (!) 156/99   Pulse (!) 54   Temp (!) 97.3 F  (36.3 C)   Resp 16   Ht 5\' 5"  (1.651 m)   Wt 199 lb (90.3 kg)   SpO2 99%   BMI 33.12 kg/m  BMI: Estimated body mass index is 33.12 kg/m as calculated from the following:   Height as of this encounter: 5\' 5"  (1.651 m).   Weight as of this encounter: 199 lb (90.3 kg). Ideal: Ideal body weight: 57 kg (125 lb 10.6 oz) Adjusted ideal body weight: 70.3 kg (155 lb)  Low back pain with radiation into bilateral legs in a dermatomal fashion  Left knee pain, worse with weightbearing  Assessment   Diagnosis  1. Chronic pain syndrome   2. Inflammatory spondylopathy of cervical region (HCC)   3. Chronic pain of left knee   4. Primary osteoarthritis of left knee   5. Radicular pain of thoracic region   6. Lumbar radiculopathy   7. Spinal stenosis, lumbar region, with neurogenic claudication   8. DDD (degenerative disc disease), cervical   9. Thoracic disc herniation      Updated Problems: No problems updated.  Plan of Care  Assessment and Plan    Chronic Pain   A fall from a recliner worsened her chronic pain, especially in the hip and leg, with severity prompting consideration of an emergency room visit. She is scheduled for a nerve conduction test to evaluate suspected nerve damage. Currently, she is on gabapentin and hydrocodone for pain management. The possibility of a spinal cord stimulator will be discussed after the test results. Continue gabapentin with three refills and refill hydrocodone. Perform the nerve conduction test on Monday. Discuss the spinal cord stimulator option based on test results and advise scheduling an earlier appointment if necessary.  Medication Management   She was taken off clopidogrel and continues on aspirin, expressing relief at reducing her medication burden. Tizanidine is not required at this time. Discontinue clopidogrel, continue aspirin, and hold off on tizanidine.  Routine Urine Screening   A routine urine screen is due as it has been a year  since the last one. Perform the urine screen today.         Ms. Laure Leone Dansby has a current medication list which includes the following long-term medication(s): albuterol, atorvastatin, duloxetine, gabapentin, metoprolol tartrate, nitroglycerin, pantoprazole, sulfasalazine, tizanidine, torsemide, and valsartan.  Pharmacotherapy (Medications Ordered): Meds ordered this encounter  Medications   HYDROcodone-acetaminophen (NORCO) 7.5-325 MG tablet    Sig: Take 1 tablet by mouth every 8 (eight) hours as needed for moderate pain (pain score 4-6).    Dispense:  90 tablet    Refill:  0   HYDROcodone-acetaminophen (NORCO) 7.5-325 MG tablet    Sig: Take 1 tablet by mouth every 8 (eight) hours as needed for moderate pain (pain score 4-6).    Dispense:  90 tablet    Refill:  0   HYDROcodone-acetaminophen (NORCO) 7.5-325 MG tablet    Sig: Take 1 tablet by mouth every 8 (eight) hours as needed for moderate pain (pain score 4-6).    Dispense:  90 tablet    Refill:  0   Orders:  Orders Placed This Encounter  Procedures   Tox[employer]SU[employer] Select 13 (MW), Urine    Volume: 30 ml(s). Minimum  3 ml of urine is needed. Document temperature of fresh sample. Indications: Long term (current) use of opiate analgesic (Z30.865)    Release to patient:   Immediate   Follow-up plan:   Return in about 3 months (around 01/29/2024) for MM, F2F.     Recent Visits Date Type Provider Dept  09/26/23 Office Visit Edward Jolly, MD [facility]  08/30/23 Procedure visit Edward Jolly, MD [facility]  08/22/23 Procedure visit Edward Jolly, MD [facility]  Showing recent visits within past 90 days and meeting all other requirements Today's Visits Date Type Provider Dept  10/30/23 Office Visit Edward Jolly, MD [facility]  Showing today's visits and meeting all other requirements Future Appointments Date Type Provider Dept  01/22/24 Appointment Edward Jolly, MD [facility]  Showing future appointments within next 90 days and meeting all other requirements  I discussed the assessment and treatment plan with the patient. The patient was provided an opportunity to ask questions and all were answered. The patient agreed with the plan and demonstrated an understanding of the instructions.  Patient advised to call back or seek an in-person evaluation if the symptoms or condition worsens.  Duration of encounter: .  Total time on encounter, as per AMA guidelines included both the face-to-face and non-face-to-face time personally spent by the physician and/or other qualified health care professional(s) on the day of the encounter (includes time in activities that require the physician or other qualified health care professional and does not include time in activities normally performed by clinical staff). Physician's time may include the following activities when performed: Preparing to see the patient (e.g., pre-charting review of records, searching for previously ordered imaging, lab work, and nerve conduction tests) Review of prior analgesic pharmacotherapies. Reviewing PMP Interpreting ordered tests (e.g., lab work, imaging, nerve conduction tests) Performing post-procedure evaluations, including interpretation of diagnostic procedures Obtaining and/or reviewing separately obtained history Performing a medically appropriate examination and/or evaluation Counseling and educating the patient/family/caregiver Ordering medications, tests, or procedures Referring and communicating with other health care professionals (when not separately reported) Documenting clinical information in the electronic or other health record Independently interpreting results (not separately reported) and communicating results to the patient/ family/caregiver Care coordination (not separately reported)  Note by: Edward Jolly, MD Date: 10/30/2023; Time: 1:53 PM

## 2023-10-30 NOTE — Patient Instructions (Signed)
 Make an appt as needed to further discuss SCS trial after your EMG/NCV study 3 months for MM F2F

## 2023-11-01 ENCOUNTER — Encounter: Payer: Medicare Other | Admitting: Student in an Organized Health Care Education/Training Program

## 2023-11-05 ENCOUNTER — Ambulatory Visit (INDEPENDENT_AMBULATORY_CARE_PROVIDER_SITE_OTHER): Admitting: Neurology

## 2023-11-05 DIAGNOSIS — M5417 Radiculopathy, lumbosacral region: Secondary | ICD-10-CM

## 2023-11-05 DIAGNOSIS — R29898 Other symptoms and signs involving the musculoskeletal system: Secondary | ICD-10-CM

## 2023-11-05 NOTE — Procedures (Signed)
 [facility]  [address], [address]  [place], [place] [address] Tel: [phone] Fax: [phone] Test Date:  11/05/2023  Patient: Tammy Lamb DOB: [birth year] Physician: Jacquelyne Balint, MD  Sex: Female Height: 5\' 5"  Ref Phys: Ernestine Mcmurray, MD  ID#: [id number]   Technician:    History: This is a 73 year old female with left lower limb pain and weakness.  NCV & EMG Findings: Extensive electrodiagnostic evaluation of left lower limb with additional nerve conduction studies of the right lower limb shows: Bilateral sural and superficial peroneal/fibular sensory responses are absent. Bilateral peroneal/fibular (EDB) motor responses show reduced amplitude (L2.1, R1.59). Left peroneal/fibular (TA) motor response shows reduced amplitude (2.3 mV). Right peroneal/fibular (TA) and bilateral tibial (AH) motor responses are within normal limits. Bilateral H reflex latencies are within normal limits. Chronic motor axon loss changes without accompanying active denervation changes are seen in the left tibialis anterior, flexor digitorum longus, gluteus medius, medial head of gastrocnemius, and short head of biceps femoris muscles. Lumbosacral paraspinal muscles were deferred due to prior surgery. Comparison studies of right lower limb were not completed due to patient discomfort with needle examination.  Impression: This is an abnormal study. The findings are most consistent with the following: The residuals of old intraspinal canal lesion(s) (ie: motor radiculopathy) at the left L5 and S1 roots or segments, mild to moderate in degree electrically. It is possible that there is an overlapping large fiber sensorimotor neuropathy, though absent sensory responses bilaterally may also be normal age-related changes. Recommend clinical correlation.    ___________________________ Jacquelyne Balint, MD    Nerve Conduction Studies Motor Nerve Results    Latency Amplitude F-Lat Segment Distance CV  Comment  Site (ms) Norm (mV) Norm (ms)  (cm) (m/s) Norm   Left Fibular (EDB) Motor  Ankle 4.4  < 6.0 *2.1  > 2.5        Bel fib head 12.0 - 1.88 -  Bel fib head-Ankle 31 41  > 40   Pop fossa 14.0 - 1.85 -  Pop fossa-Bel fib head 9 45 -   Right Fibular (EDB) Motor  Ankle 5.5  < 6.0 *1.59  > 2.5        Bel fib head 12.9 - 1.41 -  Bel fib head-Ankle 30 41  > 40   Pop fossa 14.9 - 1.27 -  Pop fossa-Bel fib head 9 45 -   Left Fibular (TA) Motor  Fib head 2.0  < 4.5 *2.3  > 3.0        Pop fossa 3.5  < 6.7 2.2 -  Pop fossa-Fib head 9 60  > 40   Right Fibular (TA) Motor  Fib head 1.95  < 4.5 4.4  > 3.0        Pop fossa 3.5  < 6.7 4.2 -  Pop fossa-Fib head 9 58  > 40   Left Tibial (AH) Motor  Ankle 4.9  < 6.0 8.3  > 4.0        Knee 15.0 - 7.7 -  Knee-Ankle 41 41  > 40   Right Tibial (AH) Motor  Ankle 4.2  < 6.0 8.7  > 4.0        Knee 11.6 - 7.0 -  Knee-Ankle 44 59  > 40    Sensory Sites    Neg Peak Lat Amplitude (O-P) Segment Distance Velocity Comment  Site (ms) Norm (V) Norm  (cm) (ms)   Left Superficial Fibular Sensory  14 cm-Ankle *  NR  < 4.6 *NR  > 3 14 cm-Ankle 14    Right Superficial Fibular Sensory  14 cm-Ankle *NR  < 4.6 *NR  > 3 14 cm-Ankle 14    Left Sural Sensory  Calf-Lat mall *NR  < 4.6 *NR  > 3 Calf-Lat mall 14    Right Sural Sensory  Calf-Lat mall *NR  < 4.6 *NR  > 3 Calf-Lat mall 14     H-Reflex Results    M-Lat H Lat H Neg Amp H-M Lat  Site (ms) (ms) Norm (mV) (ms)  Left Tibial H-Reflex  Pop fossa 5.3 -  < 35.0 - -  Right Tibial H-Reflex  Pop fossa 5.3 -  < 35.0 - -   Electromyography   Side Muscle Ins.Act Fibs Fasc Recrt Amp Dur Poly Activation Comment  Left Tib ant Nml Nml Nml *1- *1+ *1+ Nml Nml N/A  Left Gastroc MH Nml Nml Nml *1- *1+ *1+ Nml Nml N/A  Left FDL Nml Nml Nml *2- *1+ *1+ Nml Nml N/A  Left Rectus fem Nml Nml Nml Nml Nml Nml Nml Nml N/A  Left Biceps fem SH Nml Nml Nml *1- *1+ *1+ Nml Nml N/A  Left Gluteus med Nml Nml Nml *1- *1+ *1+ Nml Nml  N/A      Waveforms:  Motor               Sensory           H-Reflex

## 2023-11-06 ENCOUNTER — Other Ambulatory Visit: Payer: Self-pay

## 2023-11-06 ENCOUNTER — Encounter: Payer: Self-pay | Admitting: Neurosurgery

## 2023-11-06 MED ORDER — BREZTRI AEROSPHERE 160-9-4.8 MCG/ACT IN AERO: 2.0000 | INHALATION_SPRAY | Freq: Two times a day (BID) | RESPIRATORY_TRACT | 3 refills | Status: DC

## 2023-11-06 NOTE — Progress Notes (Signed)
 Received a fax from [facility] requesting an refill on her Breztri.  I have sent in the script.  Nothing further needed.

## 2023-11-08 ENCOUNTER — Ambulatory Visit (HOSPITAL_BASED_OUTPATIENT_CLINIC_OR_DEPARTMENT_OTHER): Admitting: Student in an Organized Health Care Education/Training Program

## 2023-11-08 ENCOUNTER — Other Ambulatory Visit: Payer: Self-pay | Admitting: Student in an Organized Health Care Education/Training Program

## 2023-11-08 ENCOUNTER — Ambulatory Visit
Admission: RE | Admit: 2023-11-08 | Discharge: 2023-11-08 | Disposition: A | Discharge status: Home / Self Care | Source: Ambulatory Visit | Attending: Student in an Organized Health Care Education/Training Program | Admitting: Student in an Organized Health Care Education/Training Program

## 2023-11-08 ENCOUNTER — Encounter: Payer: Self-pay | Admitting: Student in an Organized Health Care Education/Training Program

## 2023-11-08 VITALS — BP 154/93 | HR 53 | Temp 97.4°F | Resp 16 | Ht 65.0 in | Wt 199.0 lb

## 2023-11-08 DIAGNOSIS — R52 Pain, unspecified: Secondary | ICD-10-CM

## 2023-11-08 DIAGNOSIS — M5414 Radiculopathy, thoracic region: Secondary | ICD-10-CM | POA: Insufficient documentation

## 2023-11-08 DIAGNOSIS — G894 Chronic pain syndrome: Secondary | ICD-10-CM | POA: Insufficient documentation

## 2023-11-08 DIAGNOSIS — G608 Other hereditary and idiopathic neuropathies: Secondary | ICD-10-CM | POA: Diagnosis present

## 2023-11-08 DIAGNOSIS — M5134 Other intervertebral disc degeneration, thoracic region: Secondary | ICD-10-CM

## 2023-11-08 DIAGNOSIS — M5124 Other intervertebral disc displacement, thoracic region: Secondary | ICD-10-CM

## 2023-11-08 DIAGNOSIS — M5416 Radiculopathy, lumbar region: Secondary | ICD-10-CM | POA: Insufficient documentation

## 2023-11-08 DIAGNOSIS — M961 Postlaminectomy syndrome, not elsewhere classified: Secondary | ICD-10-CM

## 2023-11-08 LAB — TOXASSURE SELECT 13 (MW), URINE

## 2023-11-08 NOTE — Progress Notes (Signed)
 Safety precautions to be maintained throughout the outpatient stay will include: orient to surroundings, keep bed in low position, maintain call bell within reach at all times, provide assistance with transfer out of bed and ambulation.

## 2023-11-08 NOTE — Patient Instructions (Signed)
 Psych eval- [facility]- fax results to us  Thoracic MRI Antimicrobial scrub night before procedure Follow up for [employer]tr[employer] SCS trial

## 2023-11-08 NOTE — Progress Notes (Signed)
 PROVIDER NOTE: Interpretation of information contained herein should be left to medically-trained personnel. Specific patient instructions are provided elsewhere under "Patient Instructions" section of medical record. This document was created in part using AI and STT-dictation technology, any transcriptional errors that may result from this process are unintentional.  Patient: Tammy Lamb  Service: E/M   PCP: Enid Baas, MD  DOB: 02/09/1951  DOS: 11/08/2023  Provider: Edward Jolly, MD  MRN: [id number]  Delivery: Face-to-face  Specialty: Interventional Pain Management  Type: Established Patient  Setting: Ambulatory outpatient facility  Specialty designation: 09  Referring Prov.: Enid Baas, MD  Location: Outpatient office facility       HPI  [name], a 73 y.o. year old female, is here today because of her Failed back surgical syndrome [M96.1]. Tammy Lamb primary complain today is Back Pain (Lower bilateral L5,S1 ) and Knee Pain (Left )  Pertinent problems: Tammy Lamb has Neurogenic claudication; Spinal stenosis, lumbar region, with neurogenic claudication; Lumbar radiculopathy; Lumbar degenerative disc disease; Lumbar facet arthropathy; Chronic pain syndrome; Compression fracture of lumbar vertebra, non-traumatic, sequela; Lumbar post-laminectomy syndrome; Cervical facet joint syndrome; Chronic radicular cervical pain; Inflammatory spondylopathy of cervical region [facility]); and S/P cervical spinal fusion (C3-C4 ACDF (03/2019) on their pertinent problem list. Pain Assessment: Severity of Chronic pain is reported as a 8 /10. Location: Back (left knee) Lower, Left, Right/into both hips and down left leg.  knee pain goes down the shin into feet causing numbness. Onset: More than a month ago. Quality: Discomfort, Jabbing, Pressure, Constant, Aching. Timing: Constant. Modifying factor(s): nothing currently. Vitals:  height is 5\' 5"  (1.651 m) and weight is 199 lb (90.3 kg). Her temporal  temperature is 97.4 F (36.3 C) (abnormal). Her blood pressure is 154/93 (abnormal) and her pulse is 53 (abnormal). Her respiration is 16 and oxygen saturation is 97%.  BMI: Estimated body mass index is 33.12 kg/m as calculated from the following:   Height as of this encounter: 5\' 5"  (1.651 m).   Weight as of this encounter: 199 lb (90.3 kg). Last encounter: 10/30/2023. Last procedure: 08/30/2023.  Reason for encounter:   Follow-up evaluation and to review EMG and nerve conduction velocity results which show large fiber sensory neuropathy and old intraspinal canal lesions, motor radiculopathy at the L5 and S1 nerve roots on the left. Patient continues to endorse persistent and severe low back and left leg pain most pronounced in her posterior thigh underneath her popliteal fossa and in her calf.  She has had epidural injections which were not helpful.  She has done physical therapy in the past with limited response.  She has a history of L3-S1 decompression.  Pharmacotherapy Assessment  Analgesic: Hydrocodone 7.5mg  TID prn 90/month    Monitoring: [place] PMP: PDMP not reviewed this encounter.       Pharmacotherapy: No side-effects or adverse reactions reported. Compliance: No problems identified. Effectiveness: Clinically acceptable.  Vernie Ammons, RN  11/08/2023  1:35 PM  Sign when Signing Visit Safety precautions to be maintained throughout the outpatient stay will include: orient to surroundings, keep bed in low position, maintain call bell within reach at all times, provide assistance with transfer out of bed and ambulation.   No results found for: "CBDTHCR" No results found for: "D8THCCBX" No results found for: "D9THCCBX"  UDS:  Summary  Date Value Ref Range Status  10/30/2023 FINAL  Final    Comment:    ==================================================================== [employer] Select 13 (MW) ==================================================================== Test  Result       Flag       Units  Drug Present and Declared for Prescription Verification   Hydrocodone                    1103         EXPECTED   ng/mg creat   Norhydrocodone                 1955         EXPECTED   ng/mg creat    Sources of hydrocodone include scheduled prescription medications.    Norhydrocodone is an expected metabolite of hydrocodone.  ==================================================================== Test                      Result    Flag   Units      Ref Range   Creatinine              33               mg/dL      >=16 ==================================================================== Declared Medications:  The flagging and interpretation on this report are based on the  following declared medications.  Unexpected results may arise from  inaccuracies in the declared medications.   **Note: The testing scope of this panel includes these medications:   Hydrocodone (Norco)   **Note: The testing scope of this panel does not include the  following reported medications:   Acetaminophen (Tylenol)  Acetaminophen (Norco)  Albuterol (Ventolin HFA)  Aspirin  Atorvastatin (Lipitor)  Budesonide (Breztri Aerosphere)  Cephalexin (Keflex)  Cinnamon  Citalopram (Celexa)  Duloxetine (Cymbalta)  Fish Oil  Formoterol (Breztri Aerosphere)  Gabapentin (Neurontin)  Glycopyrrolate (Breztri Aerosphere)  Hydroxyzine (Atarax)  Metoprolol (Lopressor)  Multivitamin  Nitroglycerin (Nitrostat)  Oxybutynin (Ditropan)  Pantoprazole (Protonix)  Pentosan (Elmiron)  Sulfasalazine (Azulfidine)  Supplement  Tizanidine (Zanaflex)  Torsemide (Demadex)  Valsartan (Diovan)  Vitamin B12  Vitamin C  Vitamin D3 ==================================================================== For clinical consultation, please call [phone]. ====================================================================       ROS  Constitutional: Denies any fever or  chills Gastrointestinal: No reported hemesis, hematochezia, vomiting, or acute GI distress Musculoskeletal:  As above Neurological: No reported episodes of acute onset apraxia, aphasia, dysarthria, agnosia, amnesia, paralysis, loss of coordination, or loss of consciousness  Medication Review  Cinnamon, DULoxetine, Fish Oil, HYDROcodone-acetaminophen, Multiple Vitamins-Minerals, Turmeric, acetaminophen, albuterol, ascorbic acid, aspirin EC, atorvastatin, budeson-glycopyrrolate-formoterol, cephALEXin, cholecalciferol, citalopram, cyanocobalamin, feeding supplement, gabapentin, hydrOXYzine, metoprolol tartrate, nitroGLYCERIN, oxybutynin, pantoprazole, pentosan polysulfate, sulfaSALAzine, tiZANidine, torsemide, and valsartan  History Review  Allergy: Ms. Tani is allergic to aspirin and sulfa antibiotics. Drug: Ms. Engelstad  reports no history of drug use. Alcohol:  reports that she does not currently use alcohol. Tobacco:  reports that she quit smoking about 13 months ago. Her smoking use included cigarettes. She started smoking about 57 years ago. She has a 56.2 pack-year smoking history. She has been exposed to tobacco smoke. She has never used smokeless tobacco. Social: Ms. Corne  reports that she quit smoking about 13 months ago. Her smoking use included cigarettes. She started smoking about 57 years ago. She has a 56.2 pack-year smoking history. She has been exposed to tobacco smoke. She has never used smokeless tobacco. She reports that she does not currently use alcohol. She reports that she does not use drugs. Medical:  has a past medical history of Anemia, Anxiety, Aortic atherosclerosis (HCC), Arthritis, Bilateral carotid artery stenosis, CAD (coronary artery disease),  native coronary artery (10/05/2022), Cerebral microvascular disease, Chronic lower back pain, Colitis, ulcerative (HCC), COPD (chronic obstructive pulmonary disease) (HCC), DDD (degenerative disc disease), cervical, Depression,  Diastolic dysfunction (10/19/2022), Dyspnea, GERD (gastroesophageal reflux disease), Hiatal hernia, History of 2019 novel coronavirus disease (COVID-19) (02/2021), HLD (hyperlipidemia), Hypertension, Interstitial cystitis, Long-term use of aspirin therapy, Lumbar disc disease, Motion sickness, On chronic clopidogrel therapy, Paraesophageal hernia, Parkinson's disease (HCC), Pulmonary hypertension (HCC) (04/18/2021), PVD (peripheral vascular disease) (HCC), T2DM (type 2 diabetes mellitus) (HCC), TIA (transient ischemic attack) (2015), Urinary frequency, and Wears dentures. Surgical: Ms. Bradway  has a past surgical history that includes Tonsillectomy; Cholecystectomy; Hysterectomy abdominal with salpingo-oophorectomy; Diagnostic laparoscopy; Bladder suspension; Colonoscopy; Knee arthroscopy (Left, 02/13/2014); Ankle arthroscopy (Left, 03/22/2016); Tendon repair (Left, 03/22/2016); Lumbar laminectomy/decompression microdiscectomy (N/A, 05/30/2017); Anterior cervical decomp/discectomy fusion (N/A, 04/09/2019); Lumbar laminectomy/decompression microdiscectomy (N/A, 05/27/2021); LEFT HEART CATH AND CORONARY ANGIOGRAPHY (N/A, 10/18/2022); CORONARY STENT INTERVENTION (N/A, 10/18/2022); Esophagogastroduodenoscopy (egd) with propofol (N/A, 06/06/2023); Xi robotic assisted paraesophageal hernia repair (N/A, 06/19/2023); and Insertion of mesh (06/19/2023). Family: family history includes COPD in her father; Diabetes in her mother; Heart disease in her father; Hypertension in her father and mother.  Laboratory Chemistry Profile   Renal Lab Results  Component Value Date   BUN 8 06/23/2023   CREATININE 0.74 06/23/2023   GFRAA >60 04/02/2019   GFRNONAA >60 06/23/2023    Hepatic Lab Results  Component Value Date   AST 61 (H) 06/22/2023   ALT 40 06/22/2023   ALBUMIN 3.1 (L) 06/22/2023   ALKPHOS 36 (L) 06/22/2023    Electrolytes Lab Results  Component Value Date   NA 134 (L) 06/23/2023   K 3.8 06/23/2023   CL  99 06/23/2023   CALCIUM 9.0 06/23/2023   MG 1.9 06/23/2023    Bone No results found for: "VD25OH", "VD125OH2TOT", "QI6962XB2", "WU1324MW1", "25OHVITD1", "25OHVITD2", "25OHVITD3", "TESTOFREE", "TESTOSTERONE"  Inflammation (CRP: Acute Phase) (ESR: Chronic Phase) No results found for: "CRP", "ESRSEDRATE", "LATICACIDVEN"       Note: Above Lab results reviewed.  Recent Imaging Review  DG PAIN CLINIC C-ARM 1-60 MIN NO REPORT Fluoro was used, but no Radiologist interpretation will be provided.  Please refer to "NOTES" tab for provider progress note. Note: Reviewed        Physical Exam  General appearance: Well nourished, well developed, and well hydrated. In no apparent acute distress Mental status: Alert, oriented x 3 (person, place, & time)       Respiratory: No evidence of acute respiratory distress Eyes: PERLA Vitals: BP (!) 154/93 (BP Location: Right Arm, Patient Position: Sitting, Cuff Size: Normal)   Pulse (!) 53   Temp (!) 97.4 F (36.3 C) (Temporal)   Resp 16   Ht 5\' 5"  (1.651 m)   Wt 199 lb (90.3 kg)   SpO2 97%   BMI 33.12 kg/m  BMI: Estimated body mass index is 33.12 kg/m as calculated from the following:   Height as of this encounter: 5\' 5"  (1.651 m).   Weight as of this encounter: 199 lb (90.3 kg). Ideal: Ideal body weight: 57 kg (125 lb 10.6 oz) Adjusted ideal body weight: 70.3 kg (155 lb)  Lumbar surgical scar noted  Low back pain with radiation into bilateral legs in a dermatomal fashion, left greater than right   Left knee and leg pain, worse with weightbearing  Assessment   Diagnosis  1. Failed back surgical syndrome   2. Lumbar post-laminectomy syndrome   3. Radicular pain of thoracic region   4. Lumbar radiculopathy  5. Thoracic disc herniation   6. 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 Chronic pain syndrome   8. Other intervertebral disc degeneration, thoracic region      Updated Problems: Problem  Lumbar Post-Laminectomy  Syndrome  Idiopathic Small and Large Fiber Sensory Neuropathy    Plan of Care  The patient presents with chronic, intractable pain involving both mechanical low back pain and neurogenic components radiating into the lower extremities. Given the patient's history of failed back surgery syndrome and radiculopathy, and limited response to conservative and interventional therapies, a spinal cord stimulator (SCS) trial is being considered. While SCS is typically more effective for neuropathic and appendicular pain, we discussed that some patients may also experience relief in axial low back and hip pain. The potential benefits and risks of spinal cord stimulation were thoroughly reviewed.  I was also able to evaluate her interlaminar windows under live fluoroscopy and while they appeared collapsed with limited percutaneous access I believe that we can attempt to get at least 1 PERC lead in place with proper positioning.  The patient will also need a thoracic MRI for spinal cord stimulator trial planning to rule out thoracic canal stenosis  The proposed plan includes a percutaneous spinal cord stimulator trial. The patient was informed that this will involve temporary placement of epidural leads connected to an external pulse generator, which will be used over a 7-day trial period. We discussed the possibility of a mid-trial in-office visit to adjust settings and optimize programming in order to give the patient the best chance of success. The patient will receive daily support from the device representative throughout the trial.  We reviewed the benefits of SCS, which include potential substantial pain relief, reduction in the use of oral pain medications including opioids, and long-term programmable therapy that can reduce reliance on repeated injections and other pain interventions. Risks were also discussed in detail and include potential surgical complications such as infection, bleeding, CSF leak, lead  migration or fracture, hardware malfunction, and the possibility of either no pain relief or worsening of symptoms. The patient was advised that spinal cord stimulation is not a guaranteed solution or a "magic bullet," but rather a potentially valuable therapy in appropriately selected cases.  As part of standard protocol, the patient will require a comprehensive psychosocial and behavioral evaluation prior to the trial. A referral was placed to [facility], and the patient was instructed to have the results faxed to our clinic prior to scheduling the procedure. We had a thorough and detailed discussion reviewing the rationale, alternatives, risks, and expected outcomes. The patient stated that all questions were answered to their satisfaction, demonstrated appropriate understanding, and expressed readiness to proceed. There were no barriers to understanding the treatment plan, and the explanation was well received. The patient is eager to move forward with the spinal cord stimulator trial once the necessary evaluation is complete.   Pharmacotherapy (Medications Ordered): No orders of the defined types were placed in this encounter.  Orders:  Orders Placed This Encounter  Procedures   [facility] TRIAL    Contact medical implant company representative to notify them of the scheduled case and to make sure they will be available to provide required equipment.    Standing Status:   Future    Expected Date:   12/08/2023    Expiration Date:   02/07/2024    Scheduling Instructions:     Side: Bilateral     Level: Lumbar     Device: Medtronic  Sedation: With sedation     Timeframe: As soon as pre-approved    Where will this procedure be performed?:   [facility]   MR THORACIC SPINE WO CONTRAST    Patient presents with axial pain with possible radicular component. Please assist [place] in identifying specific level(s) and laterality of any additional findings such as: 1. Facet (Zygapophyseal) joint DJD  (Hypertrophy, space narrowing, subchondral sclerosis, and/or osteophyte formation) 2. DDD and/or IVDD (Loss of disc height, desiccation, gas patterns, osteophytes, endplate sclerosis, or "Black disc disease") 3. Pars defects 4. Spondylolisthesis, spondylosis, and/or spondyloarthropathies (include Degree/Grade of displacement in mm) (stability) 5. Vertebral body Fractures (acute/chronic) (state percentage of collapse) 6. Demineralization (osteopenia/osteoporotic) 7. Bone pathology 8. Foraminal narrowing  9. Surgical changes 10. Central, Lateral Recess, and/or Foraminal Stenosis (include AP diameter of stenosis in mm) 11. Surgical changes (hardware type, status, and presence of fibrosis) 12. Modic Type Changes (MRI only) 13. IVDD (Disc bulge, protrusion, herniation, extrusion) (Level, laterality, extent) Mcr Epic WT 191 HT 5'5/NO NEEDS/CLAUS NO METAL REMOVED/ NO IMPLANTS/ NO BULLETS OR BB'S/ NO GLUCOSE MONITOR,  STIMULATOR OR INJECTORS DEFIBRULATOR NO PORT NO BRAIN CLIP / NO PREV SX/ NO BRAIN yes heart stent EYE OR EAR SX/NKDA TO CONTRAST/NO RENAL FAILURE, NO DIAYLSIS/ Kf SPOKE W pt    Standing Status:   Future    Expiration Date:   02/07/2024    Scheduling Instructions:     Please make sure that the patient understands that this needs to be done as soon as possible. Never have the patient do the imaging "just before the next appointment". Inform patient that having the imaging done within the [facility] will expedite the availability of the results and will provide      imaging availability to the requesting physician. In addition inform the patient that the imaging order has an expiration date and will not be renewed if not done within the active period.    What is the patient's sedation requirement?:   No Sedation    Does the patient have a pacemaker or implanted devices?:   No    Preferred imaging location?:   [facility] (table limit-550lbs)    Call Results- Best Contact Number?:    [phone] [facility]    Radiology Contrast Protocol - do NOT remove file path:   [email or web address]   Ambulatory referral to Psychology    Referral Priority:   Routine    Referral Type:   Psychiatric    Referral Reason:   Specialty Services Required    Requested Specialty:   Psychology    Number of Visits Requested:   1   Follow-up plan:   Return in about 27 days (around 12/05/2023) for Medtronic SCS trial.    Recent Visits Date Type Provider Dept  10/30/23 Office Visit Edward Jolly, MD [facility]  09/26/23 Office Visit Edward Jolly, MD [facility]  08/30/23 Procedure visit Edward Jolly, MD [facility]  08/22/23 Procedure visit Edward Jolly, MD [facility]  Showing recent visits within past 90 days and meeting all other requirements Today's Visits Date Type Provider Dept  11/08/23 Office Visit Edward Jolly, MD [facility]  Showing today's visits and meeting all other requirements Future Appointments Date Type Provider Dept  01/22/24 Appointment Edward Jolly, MD [facility]  Showing future appointments within next 90 days and meeting all other requirements  I discussed the assessment and treatment plan with  the patient. The patient was provided an opportunity to ask questions and all were answered. The patient agreed with the plan and demonstrated an understanding of the instructions.  Patient advised to call back or seek an in-person evaluation if the symptoms or condition worsens.  Duration of encounter: .  Total time on encounter, as per AMA guidelines included both the face-to-face and non-face-to-face time personally spent by the physician and/or other qualified health care professional(s) on the day of the encounter (includes time in activities that require the physician or other qualified health care professional and does  not include time in activities normally performed by clinical staff). Physician's time may include the following activities when performed: Preparing to see the patient (e.g., pre-charting review of records, searching for previously ordered imaging, lab work, and nerve conduction tests) Review of prior analgesic pharmacotherapies. Reviewing PMP Interpreting ordered tests (e.g., lab work, imaging, nerve conduction tests) Performing post-procedure evaluations, including interpretation of diagnostic procedures Obtaining and/or reviewing separately obtained history Performing a medically appropriate examination and/or evaluation Counseling and educating the patient/family/caregiver Ordering medications, tests, or procedures Referring and communicating with other health care professionals (when not separately reported) Documenting clinical information in the electronic or other health record Independently interpreting results (not separately reported) and communicating results to the patient/ family/caregiver Care coordination (not separately reported)  Note by: Cephus Collin, MD (TTS and AI technology used. I apologize for any typographical errors that were not detected and corrected.) Date: 11/08/2023; Time: 3:28 PM

## 2023-11-20 ENCOUNTER — Telehealth: Payer: Self-pay | Admitting: *Deleted

## 2023-11-20 NOTE — Telephone Encounter (Signed)
 Spoke with patient regarding a date for SCS trial. She states she is going for an MRI on Thursday to see if the leads can be put in. Once she has those results we will schedule her appointment. She is in agreement with this plan.

## 2023-11-22 ENCOUNTER — Ambulatory Visit
Admission: RE | Admit: 2023-11-22 | Discharge: 2023-11-22 | Disposition: A | Discharge status: Home / Self Care | Source: Ambulatory Visit | Attending: Student in an Organized Health Care Education/Training Program | Admitting: Student in an Organized Health Care Education/Training Program

## 2023-11-22 DIAGNOSIS — M5124 Other intervertebral disc displacement, thoracic region: Secondary | ICD-10-CM

## 2023-11-22 DIAGNOSIS — M5134 Other intervertebral disc degeneration, thoracic region: Secondary | ICD-10-CM

## 2023-11-22 DIAGNOSIS — M5414 Radiculopathy, thoracic region: Secondary | ICD-10-CM

## 2023-11-28 ENCOUNTER — Telehealth: Payer: Self-pay | Admitting: Cardiology

## 2023-11-28 MED ORDER — PANTOPRAZOLE SODIUM 40 MG PO TBEC
40.0000 mg | DELAYED_RELEASE_TABLET | Freq: Every day | ORAL | 1 refills | Status: DC
Start: 2023-11-28 — End: 2024-03-05

## 2023-11-28 NOTE — Telephone Encounter (Signed)
*  STAT* If patient is at the pharmacy, call can be transferred to refill team.   1. Which medications need to be refilled? (please list name of each medication and dose if known) Pantoprazole    2. Would you like to learn more about the convenience, safety, & potential cost savings by using the [facility]?     3. Are you open to using the [facility] (Type [facility].    4. Which pharmacy/location (including street and city if local pharmacy) is medication to be sent to?  [facility] [place]   5. Do they need a 30 day or 90 day supply? 90  days and refills

## 2023-12-01 ENCOUNTER — Other Ambulatory Visit: Payer: Self-pay | Admitting: Cardiology

## 2023-12-12 ENCOUNTER — Telehealth: Payer: Self-pay | Admitting: Student in an Organized Health Care Education/Training Program

## 2023-12-12 ENCOUNTER — Ambulatory Visit (INDEPENDENT_AMBULATORY_CARE_PROVIDER_SITE_OTHER): Payer: Medicare Other | Admitting: Pulmonary Disease

## 2023-12-12 ENCOUNTER — Encounter: Payer: Self-pay | Admitting: Pulmonary Disease

## 2023-12-12 VITALS — BP 138/88 | HR 67 | Temp 97.6°F | Ht 65.0 in | Wt 199.6 lb

## 2023-12-12 DIAGNOSIS — Z87891 Personal history of nicotine dependence: Secondary | ICD-10-CM | POA: Diagnosis not present

## 2023-12-12 DIAGNOSIS — J449 Chronic obstructive pulmonary disease, unspecified: Secondary | ICD-10-CM | POA: Diagnosis not present

## 2023-12-12 DIAGNOSIS — R911 Solitary pulmonary nodule: Secondary | ICD-10-CM

## 2023-12-12 DIAGNOSIS — Z9889 Other specified postprocedural states: Secondary | ICD-10-CM | POA: Diagnosis not present

## 2023-12-12 NOTE — Telephone Encounter (Signed)
 Called reading room to have MRI read.

## 2023-12-12 NOTE — Telephone Encounter (Signed)
 Tammy Lamb can you get her MRI read. We are waiting on it for SCS.

## 2023-12-12 NOTE — Progress Notes (Signed)
 Subjective:    Patient ID: Tammy Lamb, female    DOB: [birth year], 73 y.o.   MRN: [id number]  Patient Care Team: Rex Castor, MD as PCP - General (Internal Medicine) Constancia Delton, MD as PCP - Cardiology (Cardiology) Marc Senior, MD as Consulting Physician (Pulmonary Disease)  Chief Complaint  Patient presents with   Follow-up    No cough or wheezing. Occasional shortness of breath on exertion.     BACKGROUND/INTERVAL:Patient is a 73 year old recent former smoker (March 2024, 49 PY) history as noted below who presents for follow-up of an lung nodule incidentally found on lung cancer screening CT. She was also noted to have a giant paraesophageal hernia with intrathoracic stomach. She underwent surgery for repair on 19 June 2023. She did have a somewhat complicated postsurgical course.  Previously patient had stent placement in March 2024 for unstable angina stent was placed to the RCA lesion that was the culprit.  Patient was last seen on 13 August 2023, respiratory wise, no new issues.  She is currently not experiencing any dyspnea.  Her major issues are those of back pain.  HPI Discussed the use of AI scribe software for clinical note transcription with the patient, who gave verbal consent to proceed.  History of Present Illness   Tammy Lamb is a 73 year old female COPD who presents for follow-up.  She has experienced improvement in her shortness of breath, which was previously associated with her large paraesophageal hernia with intrathoracic stomach.  She underwent repair as noted above on November 2024.  Her issues with eating have also improved.  She is currently experiencing back and hip pain and is awaiting the results of an MRI of the spine.  She describes episodes of constipation.  She is currently undergoing workup for this by her primary care physician.  With regards to her COPD she is stable on Breztri  and as needed albuterol .  Previously she  was noted to have a lung nodule which eventually resolved.  She is now back on regular lung cancer screening protocol.   Review of Systems A 10 point review of systems was performed and it is as noted above otherwise negative.   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 11/08/2023   Acute on chronic diastolic CHF (congestive heart failure) (HCC) 06/22/2023   COPD (chronic obstructive pulmonary disease) (HCC) 06/21/2023   Acute hypoxic respiratory failure (HCC) 06/21/2023   Elevated LFTs 06/21/2023   Protein calorie malnutrition (HCC) 06/21/2023   Depression 06/21/2023   Acute anemia 06/21/2023   Hyponatremia 06/21/2023   Hiatal hernia with gastroesophageal reflux 06/19/2023   S/P repair of paraesophageal hernia 06/19/2023   Primary osteoarthritis of right knee 03/27/2023   Chronic pain of left knee 03/27/2023   Influenza A 10/20/2022   Coronavirus infection 10/20/2022   Chest pain 10/19/2022   Coronary artery disease involving native coronary artery of native heart with unstable angina pectoris (HCC) 10/19/2022   Status post primary angioplasty with coronary stent 10/19/2022   Unstable angina (HCC) 10/18/2022   COPD with acute exacerbation (HCC) 10/18/2022   AKI (acute kidney injury) (HCC) 10/18/2022   Ulcerative colitis (HCC) 10/18/2022   Bilateral hip pain 12/26/2021   Thoracic disc herniation 08/25/2021   Radicular pain of thoracic region 08/25/2021   Atherosclerotic peripheral vascular disease with intermittent claudication (HCC) 03/08/2021   Benign essential HTN 03/08/2021   Bilateral carotid artery stenosis 03/08/2021  Hyperlipidemia, mixed 03/08/2021   Bilateral occipital neuralgia 09/16/2020   Chronic SI joint pain 09/16/2020   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   High-tone pelvic floor dysfunction 08/21/2019   Recurrent UTI 08/21/2019   S/P cervical spinal fusion (C3-C4 ACDF (03/2019) 04/29/2019    Cervicalgia 04/29/2019   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   Cervical facet joint syndrome 10/01/2018   DDD (degenerative disc disease), cervical 10/01/2018   Chronic radicular cervical pain 10/01/2018   Compression fracture of lumbar vertebra, non-traumatic, sequela 02/28/2018   Lumbar post-laminectomy syndrome 02/28/2018   Spinal stenosis, lumbar region, with neurogenic claudication 09/27/2017   Lumbar radiculopathy 09/27/2017   Lumbar degenerative disc disease 09/27/2017   Lumbar facet arthropathy 09/27/2017   Chronic pain syndrome 09/27/2017   Neurogenic claudication 05/30/2017    Social History   Tobacco Use   Smoking status: Former    Current packs/day: 0.00    Average packs/day: 1 pack/day for 56.2 years (56.2 ttl pk-yrs)    Types: Cigarettes    Start date: 86    Quit date: 09/22/2022    Years since quitting: 1.2    Passive exposure: Past   Smokeless tobacco: Never   Tobacco comments:    Quit in March of 2024.  Substance Use Topics   Alcohol use: Not Currently    Comment: Occassional,    Allergies  Allergen Reactions   Aspirin  Other (See Comments)    GERD   Sulfa Antibiotics Nausea And Vomiting    Current Meds  Medication Sig   acetaminophen  (TYLENOL ) 500 MG tablet Take 1,000 mg by mouth every 6 (six) hours as needed for moderate pain or headache.   albuterol  (VENTOLIN  HFA) 108 (90 Base) MCG/ACT inhaler Inhale 2 puffs into the lungs every 6 (six) hours as needed for wheezing or shortness of breath.   ascorbic acid  (VITAMIN C) 1000 MG tablet Take 1,000 mg by mouth once a week.   aspirin  EC 81 MG tablet Take 1 tablet (81 mg total) by mouth daily. Swallow whole.   atorvastatin  (LIPITOR ) 80 MG tablet Take 80 mg by mouth at bedtime.   budeson-glycopyrrolate -formoterol  (BREZTRI  AEROSPHERE) 160-9-4.8 MCG/ACT AERO inhaler Inhale 2 puffs into the lungs in the morning and at bedtime.   cephALEXin  (KEFLEX ) 250 MG capsule Take 250 mg by mouth  daily.   cholecalciferol  (VITAMIN D ) 25 MCG (1000 UNIT) tablet Take 1,000 Units by mouth at bedtime.   Cinnamon 500 MG TABS Take 500 mg by mouth in the morning and at bedtime.   citalopram  (CELEXA ) 20 MG tablet Take 20 mg by mouth every morning.   DULoxetine  (CYMBALTA ) 60 MG capsule Take 60 mg by mouth daily.   feeding supplement (ENSURE ENLIVE / ENSURE PLUS) LIQD Take 237 mLs by mouth 2 (two) times daily between meals.   gabapentin  (NEURONTIN ) 300 MG capsule Take 3 capsules (900 mg total) by mouth 2 (two) times daily.   HYDROcodone -acetaminophen  (NORCO) 7.5-325 MG tablet Take 1 tablet by mouth every 8 (eight) hours as needed for moderate pain (pain score 4-6).   [START ON 01/09/2024] HYDROcodone -acetaminophen  (NORCO) 7.5-325 MG tablet Take 1 tablet by mouth every 8 (eight) hours as needed for moderate pain (pain score 4-6).   hydrOXYzine  (ATARAX /VISTARIL ) 25 MG tablet Take 25 mg by mouth at bedtime.   metoprolol  tartrate (LOPRESSOR ) 25 MG tablet Take 1 tablet (25 mg total) by mouth 2 (two) times daily.   Multiple Vitamins-Minerals (HAIR SKIN NAILS PO) Take 1 tablet by mouth in the morning and  at bedtime.   Omega-3 Fatty Acids (FISH OIL) 1200 MG CAPS Take 1,200 mg by mouth once a week.   oxybutynin  (DITROPAN -XL) 10 MG 24 hr tablet Take 10 mg by mouth at bedtime.   pantoprazole  (PROTONIX ) 40 MG tablet Take 1 tablet (40 mg total) by mouth daily.   pentosan polysulfate (ELMIRON ) 100 MG capsule Take 100 mg by mouth 2 (two) times daily.   sulfaSALAzine  (AZULFIDINE ) 500 MG tablet Take 500 mg by mouth 2 (two) times daily.   tiZANidine  (ZANAFLEX ) 4 MG tablet Take 1 tablet (4 mg total) by mouth every 12 (twelve) hours as needed.   torsemide  (DEMADEX ) 20 MG tablet Take 1 tablet (20 mg total) by mouth daily as needed.   TURMERIC CURCUMIN PO Take 1 capsule by mouth daily.   valsartan (DIOVAN) 160 MG tablet Take 160 mg by mouth daily.   vitamin B-12 (CYANOCOBALAMIN ) 1000 MCG tablet Take 1,000 mcg by mouth  daily.    Immunization History  Administered Date(s) Administered   Fluad Trivalent(High Dose 65+) 05/01/2023   Influenza Inj Mdck Quad Pf 05/17/2021   Janssen (J&J) SARS-COV-2 Vaccination 11/24/2019   Moderna Sars-Covid-2 Vaccination 07/25/2020   PNEUMOCOCCAL CONJUGATE-20 06/28/2022   Tdap 05/17/2021        Objective:     BP 138/88 (BP Location: Left Arm, Patient Position: Sitting, Cuff Size: Normal)   Pulse 67   Temp 97.6 F (36.4 C) (Temporal)   Ht 5\' 5"  (1.651 m)   Wt 199 lb 9.6 oz (90.5 kg)   SpO2 95%   BMI 33.22 kg/m   SpO2: 95 %  GENERAL: Obese woman, no acute distress, fully ambulatory, no conversational dyspnea. HEAD: Normocephalic, atraumatic.  EYES: Pupils equal, round, reactive to light.  No scleral icterus.  MOUTH: Multiple fillings noted at teeth.  Oral mucosa moist.  No thrush. NECK: Supple. No thyromegaly. Trachea midline. No JVD.  No adenopathy. PULMONARY: Good air entry bilaterally.  Coarse, otherwise, no adventitious sounds. CARDIOVASCULAR: S1 and S2. Regular rate and rhythm.  No rubs, murmurs or gallops heard. ABDOMEN: Obese, otherwise benign. MUSCULOSKELETAL: No joint deformity, no clubbing, no edema.  NEUROLOGIC: No overt focal deficit, no gait disturbance, speech is fluent. SKIN: Intact,warm,dry.  On limited exam, no overt rashes. PSYCH: Mood and behavior normal.:  Assessment & Plan:     ICD-10-CM   1. Stage 2 moderate COPD by GOLD classification (HCC)  J44.9     2. Lung nodule  R91.1     3. S/P Nissen fundoplication (without gastrostomy tube) procedure  Z98.890     4. Former cigarette smoker  Z87.891      Discussion:    Dyspnea Improvement in dyspnea symptoms with clear breath sounds on lung auscultation, indicating well-managed respiratory status. - Schedule follow-up appointment in four months to monitor respiratory status. - Ensure lung cancer screening CT is performed around October.  Constipation Intermittent constipation  with episodes of abdominal discomfort. Senokot provides relief but causes abdominal soreness. - Patient undergoing workup per primary care. - Contact healthcare provider if constipation persists or worsens.      Advised if symptoms do not improve or worsen, to please contact office for sooner follow up or seek emergency care.    I spent 30 minutes of dedicated to the care of this patient on the date of this encounter to include pre-visit review of records, face-to-face time with the patient discussing conditions above, post visit ordering of testing, clinical documentation with the electronic health record, making appropriate referrals  as documented, and communicating necessary findings to members of the patients care team.     C. Chloe Counter, MD Advanced Bronchoscopy PCCM [facility]    *This note was generated using voice recognition software/Dragon and/or AI transcription program.  Despite best efforts to proofread, errors can occur which can change the meaning. Any transcriptional errors that result from this process are unintentional and may not be fully corrected at the time of dictation.

## 2023-12-12 NOTE — Patient Instructions (Signed)
 VISIT SUMMARY:  Today, we discussed your ongoing health concerns, including your breathing difficulties, back and hip pain, and constipation. We noted improvements in your shortness of breath and eating issues related to your hernia. We also reviewed your current treatment plan and made some recommendations for managing your symptoms.  YOUR PLAN:  -COPD: Your difficulty breathing has improved with the Breztri  and surgery for your hiatal hernia. Your symptoms have improved, and your lung sounds are clear. We will schedule a follow-up appointment in four months to monitor your breathing and ensure a lung cancer screening CT is performed around October.  Continue using your Breztri  and as needed albuterol .    INSTRUCTIONS:  Please schedule a follow-up appointment in four months to monitor your respiratory status. Additionally, ensure that you have a lung cancer screening CT around October, you will be called closer to the time of the CT for definitive schedule.

## 2023-12-24 ENCOUNTER — Ambulatory Visit (INDEPENDENT_AMBULATORY_CARE_PROVIDER_SITE_OTHER): Admitting: Urology

## 2023-12-24 VITALS — BP 127/88 | HR 74 | Ht 65.0 in | Wt 203.5 lb

## 2023-12-24 DIAGNOSIS — R32 Unspecified urinary incontinence: Secondary | ICD-10-CM | POA: Diagnosis not present

## 2023-12-24 DIAGNOSIS — Z87440 Personal history of urinary (tract) infections: Secondary | ICD-10-CM

## 2023-12-24 DIAGNOSIS — N390 Urinary tract infection, site not specified: Secondary | ICD-10-CM

## 2023-12-24 DIAGNOSIS — N3010 Interstitial cystitis (chronic) without hematuria: Secondary | ICD-10-CM

## 2023-12-24 LAB — MICROSCOPIC EXAMINATION: Epithelial Cells (non renal): >10 /HPF — AB (ref 0–10)

## 2023-12-24 LAB — URINALYSIS, COMPLETE
Bilirubin, UA: NEGATIVE
Glucose, UA: NEGATIVE
Ketones, UA: NEGATIVE
Nitrite, UA: NEGATIVE
Protein,UA: NEGATIVE
RBC, UA: NEGATIVE
Specific Gravity, UA: <1.005 — ABNORMAL LOW (ref 1.005–1.030)
Urobilinogen, Ur: 0.2 mg/dL (ref 0.2–1.0)
pH, UA: 6 (ref 5.0–7.5)

## 2023-12-24 MED ORDER — CEPHALEXIN 250 MG PO CAPS: 250.0000 mg | ORAL_CAPSULE | Freq: Every day | ORAL | 3 refills | Status: AC

## 2023-12-24 MED ORDER — OXYBUTYNIN CHLORIDE ER 10 MG PO TB24
10.0000 mg | ORAL_TABLET | Freq: Every day | ORAL | 3 refills | Status: AC
Start: 2023-12-24 — End: ?

## 2023-12-24 MED ORDER — HYDROXYZINE HCL 25 MG PO TABS: 25.0000 mg | ORAL_TABLET | Freq: Every day | ORAL | 3 refills | Status: AC

## 2023-12-24 MED ORDER — PENTOSAN POLYSULFATE SODIUM 150 MG PO CPDR: 1.0000 | DELAYED_RELEASE_CAPSULE | Freq: Two times a day (BID) | ORAL | 11 refills | Status: AC

## 2023-12-24 NOTE — Progress Notes (Signed)
 12/24/2023 1:19 PM   Tammy Lamb [birth year] [id number]  Referring provider: Rex Castor, MD [address] [place],  [place] [address]  Chief Complaint  Patient presents with   Cystitis    HPI: Patient followed by Dr. Luster Salters in [place] but she says the drive is too far.  She may have a history of interstitial cystitis dating back 10 years.  She states she gets recurrent bladder infections.  She has had a sling.  She is on a number of medications that help her incontinence.  She currently is continent during the day.  At night she will have foot on the floor syndrome if she holds it too long.  She gets at least 4 times at night.  She voids every 30 to 60 minutes during the day and cannot hold it for 2 hours.  She is likely had a TIA.  She has had a hysterectomy  She takes hydroxyzine  25 mg at bedtime 90 x 3..  She takes cephalexin  250 mg daily 30 x 11.  She takes Elmiron  150 mg twice a day 60 x 11, she takes oxybutynin  10 mg 90 x 3.  It appears that the Elmiron  [facility] [place] .  They just mailed the medication to her.  This is a cheaper way of getting the Elmiron .  Again she says she is very stable on all these medicines.  It appears she has been on Elavil  25 mg and gabapentin  in the past when I reviewed notes from 2022.     PMH: Past Medical History:  Diagnosis Date   Anemia    Anxiety    Aortic atherosclerosis (HCC)    Arthritis    Bilateral carotid artery stenosis    CAD (coronary artery disease), native coronary artery 10/05/2022   a.) cCTA 10/05/2022: Ca2+ = 601 (92nd %ile); LAD 383, RCA 217; b.) LHC 10/18/2022: 85% pRCA ( 3.5 x 15 mm Onyx Frontier DES)   Cerebral microvascular disease    Chronic lower back pain    Colitis, ulcerative (HCC)    COPD (chronic obstructive pulmonary disease) (HCC)    DDD (degenerative disc disease), cervical    a.) s/p ACDF C3-C4 04/09/2019   Depression    Diastolic dysfunction  10/19/2022   a.) TTE 10/19/2022: EF 60-65%, no RWMAs,  G1DD, normal RVSF; b.) TTE 12/04/2022: EF >55%. no RWMAs, G1DD, mild LAE, triv AR/MR/PR, mod TR, PASP 45   Dyspnea    GERD (gastroesophageal reflux disease)    Hiatal hernia    History of 2019 novel coronavirus disease (COVID-19) 02/2021   HLD (hyperlipidemia)    Hypertension    Interstitial cystitis    Long-term use of aspirin  therapy    Lumbar disc disease    a.) s/p L3-L5 laminectomy, decompression, and microdiscectomy 05/30/2017; b.) s/p L4-S1 decompression 05/27/2021   Motion sickness    fair rides   On chronic clopidogrel  therapy    Paraesophageal hernia    Parkinson's disease (HCC)    suspected per dr Mason Sole @ [facility] neuro   Pulmonary hypertension (HCC) 04/18/2021   a.) TTE 04/18/2021: PASP 42.9; b.) TTE 12/04/2022: PASP 45   PVD (peripheral vascular disease) (HCC)    T2DM (type 2 diabetes mellitus) (HCC)    TIA (transient ischemic attack) 2015   a.) no deficits; per pt her carvedilol  dose was too high causing hypotension   Urinary frequency    Wears dentures    partial upper and lower  Surgical History: Past Surgical History:  Procedure Laterality Date   ANKLE ARTHROSCOPY Left 03/22/2016   Procedure: ANKLE ARTHROSCOPY DEBRODEMENT EXTENSIVE LEFT FLEXAR TENDON REPAIR;  Surgeon: Anell Baptist, DPM;  Location[facility];  Service: Podiatry;  Laterality: Left;  WITH POPLITEAL   ANTERIOR CERVICAL DECOMP/DISCECTOMY FUSION N/A 04/09/2019   Procedure: ANTERIOR CERVICAL DECOMPRESSION/DISCECTOMY FUSION 1 LEVEL C3-4;  Surgeon: Jodeen Munch, MD;  Location: [facility];  Service: Neurosurgery;  Laterality: N/A;   BLADDER SUSPENSION     CHOLECYSTECTOMY     COLONOSCOPY     CORONARY STENT INTERVENTION N/A 10/18/2022   Procedure: CORONARY STENT INTERVENTION;  Surgeon: Wenona Hamilton, MD;  Location: [facility];  Service: Cardiovascular;  Laterality: N/A;   DIAGNOSTIC LAPAROSCOPY     adhesions    ESOPHAGOGASTRODUODENOSCOPY (EGD) WITH PROPOFOL  N/A 06/06/2023   Procedure: ESOPHAGOGASTRODUODENOSCOPY (EGD) WITH PROPOFOL ;  Surgeon: Conrado Delay, DO;  Location: [facility];  Service: General;  Laterality: N/A;  patient to stay on plavix  and aspirin  due to recent stent placement, Dr. Rosea Conch aware   HYSTERECTOMY ABDOMINAL WITH SALPINGO-OOPHORECTOMY     INSERTION OF MESH  06/19/2023   Procedure: INSERTION OF MESH;  Surgeon: Alben Alma, MD;  Location: [facility];  Service: General;;  paraesophageal hernia repair   KNEE ARTHROSCOPY Left 02/13/2014   Procedure: LEFT ARTHROSCOPY KNEE, PARTIAL MEDIAL MENISECTOMY AND PLICA, CHONDROPLASTY OF PATELLA FEMORAL JOINT;  Surgeon: Boston Byers, MD;  Location: [facility];  Service: Orthopedics;  Laterality: Left;   LEFT HEART CATH AND CORONARY ANGIOGRAPHY N/A 10/18/2022   Procedure: LEFT HEART CATH AND CORONARY ANGIOGRAPHY;  Surgeon: Wenona Hamilton, MD;  Location: [facility];  Service: Cardiovascular;  Laterality: N/A;   LUMBAR LAMINECTOMY/DECOMPRESSION MICRODISCECTOMY N/A 05/30/2017   Procedure: LUMBAR LAMINECTOMY/DECOMPRESSION MICRODISCECTOMY 3 LEVELS-L3-S1;  Surgeon: Jodeen Munch, MD;  Location: [facility];  Service: Neurosurgery;  Laterality: N/A;   LUMBAR LAMINECTOMY/DECOMPRESSION MICRODISCECTOMY N/A 05/27/2021   Procedure: L4-S1 DECOMPRESSION;  Surgeon: Jodeen Munch, MD;  Location: [facility];  Service: Neurosurgery;  Laterality: N/A;   TENDON REPAIR Left 03/22/2016   Procedure: ZOXWRU TENDON REPAIR;  Surgeon: Anell Baptist, DPM;  Location: [facility];  Service: Podiatry;  Laterality: Left;   TONSILLECTOMY     XI ROBOTIC ASSISTED PARAESOPHAGEAL HERNIA REPAIR N/A 06/19/2023   Procedure: XI ROBOTIC ASSISTED PARAESOPHAGEAL HERNIA REPAIR, RNFA to assist;  Surgeon: Alben Alma, MD;  Location: [facility];  Service: General;  Laterality: N/A;    Home Medications:  Allergies as of 12/24/2023       Reactions    Aspirin  Other (See Comments)   GERD   Sulfa Antibiotics Nausea And Vomiting        Medication List        Accurate as of December 24, 2023  1:19 PM. If you have any questions, ask your nurse or doctor.          acetaminophen  500 MG tablet Commonly known as: TYLENOL  Take 1,000 mg by mouth every 6 (six) hours as needed for moderate pain or headache.   albuterol  108 (90 Base) MCG/ACT inhaler Commonly known as: VENTOLIN  HFA Inhale 2 puffs into the lungs every 6 (six) hours as needed for wheezing or shortness of breath.   ascorbic acid  1000 MG tablet Commonly known as: VITAMIN C Take 1,000 mg by mouth once a week.   aspirin  EC 81 MG tablet Take 1 tablet (81 mg total) by mouth daily. Swallow whole.   atorvastatin  80 MG  tablet Commonly known as: LIPITOR  Take 80 mg by mouth at bedtime.   Breztri  Aerosphere 160-9-4.8 MCG/ACT Aero inhaler Generic drug: budesonide-glycopyrrolate -formoterol  Inhale 2 puffs into the lungs in the morning and at bedtime.   cephALEXin  250 MG capsule Commonly known as: KEFLEX  Take 250 mg by mouth daily.   cholecalciferol  25 MCG (1000 UNIT) tablet Commonly known as: VITAMIN D3 Take 1,000 Units by mouth at bedtime.   Cinnamon 500 MG Tabs Take 500 mg by mouth in the morning and at bedtime.   citalopram  20 MG tablet Commonly known as: CELEXA  Take 20 mg by mouth every morning.   cyanocobalamin  1000 MCG tablet Commonly known as: VITAMIN B12 Take 1,000 mcg by mouth daily.   DULoxetine  60 MG capsule Commonly known as: CYMBALTA  Take 60 mg by mouth daily.   feeding supplement Liqd Take 237 mLs by mouth 2 (two) times daily between meals.   Fish Oil 1200 MG Caps Take 1,200 mg by mouth once a week.   gabapentin  300 MG capsule Commonly known as: Neurontin  Take 3 capsules (900 mg total) by mouth 2 (two) times daily.   HAIR SKIN NAILS PO Take 1 tablet by mouth in the morning and at bedtime.   HYDROcodone -acetaminophen  7.5-325 MG tablet Commonly  known as: Norco Take 1 tablet by mouth every 8 (eight) hours as needed for moderate pain (pain score 4-6).   HYDROcodone -acetaminophen  7.5-325 MG tablet Commonly known as: Norco Take 1 tablet by mouth every 8 (eight) hours as needed for moderate pain (pain score 4-6). Start taking on: January 09, 2024   hydrOXYzine  25 MG tablet Commonly known as: ATARAX  Take 25 mg by mouth at bedtime.   metoprolol  tartrate 25 MG tablet Commonly known as: LOPRESSOR  Take 1 tablet (25 mg total) by mouth 2 (two) times daily.   nitroGLYCERIN  0.4 MG SL tablet Commonly known as: NITROSTAT  Place 1 tablet (0.4 mg total) under the tongue every 5 (five) minutes as needed for chest pain.   oxybutynin  10 MG 24 hr tablet Commonly known as: DITROPAN -XL Take 10 mg by mouth at bedtime.   pantoprazole  40 MG tablet Commonly known as: PROTONIX  Take 1 tablet (40 mg total) by mouth daily.   pentosan polysulfate 100 MG capsule Commonly known as: ELMIRON  Take 100 mg by mouth 2 (two) times daily.   sulfaSALAzine  500 MG tablet Commonly known as: AZULFIDINE  Take 500 mg by mouth 2 (two) times daily.   tiZANidine  4 MG tablet Commonly known as: ZANAFLEX  Take 1 tablet (4 mg total) by mouth every 12 (twelve) hours as needed.   torsemide  20 MG tablet Commonly known as: DEMADEX  Take 1 tablet (20 mg total) by mouth daily as needed.   TURMERIC CURCUMIN PO Take 1 capsule by mouth daily.   valsartan 160 MG tablet Commonly known as: DIOVAN Take 160 mg by mouth daily.        Allergies:  Allergies  Allergen Reactions   Aspirin  Other (See Comments)    GERD   Sulfa Antibiotics Nausea And Vomiting    Family History: Family History  Problem Relation Age of Onset   Diabetes Mother    Hypertension Mother    Hypertension Father    COPD Father    Heart disease Father    Breast cancer Neg Hx     Social History:  reports that she quit smoking about 15 months ago. Her smoking use included cigarettes. She started  smoking about 57 years ago. She has a 56.2 pack-year smoking history. She has  been exposed to tobacco smoke. She has never used smokeless tobacco. She reports that she does not currently use alcohol. She reports that she does not use drugs.  ROS:                                        Physical Exam: There were no vitals taken for this visit.  Constitutional:  Alert and oriented, No acute distress. HEENT: [place] AT, moist mucus membranes.  Trachea midline, no masses.  Laboratory Data: Lab Results  Component Value Date   WBC 7.2 06/23/2023   HGB 8.1 (L) 06/23/2023   HCT 24.7 (L) 06/23/2023   MCV 81.5 06/23/2023   PLT 289 06/23/2023    Lab Results  Component Value Date   CREATININE 0.74 06/23/2023    No results found for: "PSA"  No results found for: "TESTOSTERONE"  No results found for: "HGBA1C"  Urinalysis    Component Value Date/Time   COLORURINE YELLOW (A) 05/23/2021 1359   APPEARANCEUR CLEAR (A) 05/23/2021 1359   LABSPEC 1.010 05/23/2021 1359   PHURINE 7.0 05/23/2021 1359   GLUCOSEU NEGATIVE 05/23/2021 1359   HGBUR NEGATIVE 05/23/2021 1359   BILIRUBINUR NEGATIVE 05/23/2021 1359   KETONESUR NEGATIVE 05/23/2021 1359   PROTEINUR NEGATIVE 05/23/2021 1359   NITRITE NEGATIVE 05/23/2021 1359   LEUKOCYTESUR MODERATE (A) 05/23/2021 1359    Pertinent Imaging:   Assessment & Plan: All prescriptions renewed as noted above.  I will see in 1 year and as needed  1. IC (interstitial cystitis) (Primary)  - Urinalysis, Complete   No follow-ups on file.  Devorah Fonder, MD  [facility] [address], [address] [place], [place] [address] [phone]

## 2024-01-03 ENCOUNTER — Telehealth: Payer: Self-pay

## 2024-01-03 NOTE — Telephone Encounter (Signed)
 Called pharmacy back, RX we sent in was for delayed release tablets and they wanted to make sure immediate release tablets were ok, advised that delayed release was the only RX that came up in our system and that the immediate release is ok if that is what the patient was getting in the past as we are taking over this RX for her from another urologist.

## 2024-01-03 NOTE — Telephone Encounter (Signed)
 Tammy Lamb from [facility] LM on triage line stating that the pharmacy needs more clarification on RX sent in to them from Dr. Clarke Crouch.

## 2024-01-09 ENCOUNTER — Other Ambulatory Visit: Payer: Self-pay

## 2024-01-09 MED ORDER — METOPROLOL TARTRATE 25 MG PO TABS: 25.0000 mg | ORAL_TABLET | Freq: Two times a day (BID) | ORAL | 2 refills | Status: AC

## 2024-01-10 ENCOUNTER — Telehealth: Payer: Self-pay | Admitting: Student in an Organized Health Care Education/Training Program

## 2024-01-10 ENCOUNTER — Other Ambulatory Visit: Payer: Self-pay

## 2024-01-10 MED ORDER — TORSEMIDE 20 MG PO TABS: 20.0000 mg | ORAL_TABLET | Freq: Every day | ORAL | 2 refills | Status: DC | PRN

## 2024-01-10 NOTE — Telephone Encounter (Signed)
 Telephone call from patient, wants MRI results.

## 2024-01-15 ENCOUNTER — Encounter: Payer: Self-pay | Admitting: Student in an Organized Health Care Education/Training Program

## 2024-01-15 ENCOUNTER — Ambulatory Visit
Attending: Student in an Organized Health Care Education/Training Program | Admitting: Student in an Organized Health Care Education/Training Program

## 2024-01-15 VITALS — BP 144/97 | HR 50 | Temp 97.5°F | Ht 64.0 in | Wt 198.0 lb

## 2024-01-15 DIAGNOSIS — G894 Chronic pain syndrome: Secondary | ICD-10-CM | POA: Insufficient documentation

## 2024-01-15 DIAGNOSIS — M5416 Radiculopathy, lumbar region: Secondary | ICD-10-CM | POA: Insufficient documentation

## 2024-01-15 DIAGNOSIS — M961 Postlaminectomy syndrome, not elsewhere classified: Secondary | ICD-10-CM | POA: Diagnosis not present

## 2024-01-15 NOTE — Progress Notes (Signed)
 Safety precautions to be maintained throughout the outpatient stay will include: orient to surroundings, keep bed in low position, maintain call bell within reach at all times, provide assistance with transfer out of bed and ambulation.

## 2024-01-15 NOTE — Progress Notes (Signed)
 PROVIDER NOTE: Interpretation of information contained herein should be left to medically-trained personnel. Specific patient instructions are provided elsewhere under Patient Instructions section of medical record. This document was created in part using AI and STT-dictation technology, any transcriptional errors that may result from this process are unintentional.  Patient: Tammy Lamb  Service: E/M   PCP: Sherial Bail, MD  DOB: Aug 23, 1950  DOS: 01/15/2024  Provider: Wallie Sherry, MD  MRN: [id number]  Delivery: Face-to-face  Specialty: Interventional Pain Management  Type: Established Patient  Setting: Ambulatory outpatient facility  Specialty designation: 09  Referring Prov.: Sherial Bail, MD  Location: Outpatient office facility       History of present illness (HPI) [name], a 73 y.o. year old female, is here today because of her Failed back surgical syndrome [M96.1]. [name] primary complain today is Back Pain (Lower back and left hip)  Pertinent problems: Ms. Messman has Neurogenic claudication; Spinal stenosis, lumbar region, with neurogenic claudication; Lumbar radiculopathy; Lumbar degenerative disc disease; Lumbar facet arthropathy; Chronic pain syndrome; Compression fracture of lumbar vertebra, non-traumatic, sequela; Lumbar post-laminectomy syndrome; Cervical facet joint syndrome; Chronic radicular cervical pain; Inflammatory spondylopathy of cervical region [facility]); and S/P cervical spinal fusion (C3-C4 ACDF (03/2019) on their pertinent problem list.  Pain Assessment: Severity of Chronic pain is reported as a 8 /10. Location: Back Lower/radiates down left leg to foot. Onset: More than a month ago. Quality: Burning (pinching). Timing: Intermittent. Modifying factor(s): denies. Vitals:  height is 5' 4 (1.626 m) and weight is 198 lb (89.8 kg). Her temperature is 97.5 F (36.4 C) (abnormal). Her blood pressure is 144/97 (abnormal) and her pulse is 50 (abnormal). Her  oxygen saturation is 97%.  BMI: Estimated body mass index is 33.99 kg/m as calculated from the following:   Height as of this encounter: 5' 4 (1.626 m).   Weight as of this encounter: 198 lb (89.8 kg).  Last encounter: 11/08/2023. Last procedure: 08/30/2023.  Reason for encounter:   History of Present Illness   Tammy Lamb is a 73 year old female with spinal compression at L5, S1 who presents for consideration of a spinal cord stimulator trial. She is accompanied by her husband, Christopher.   She has a history of spinal compression at L5, S1, confirmed by EMG and nerve conduction studies. An MRI of the thoracic spine was performed to assess the feasibility of placing a spinal cord stimulator due to tightness at T7, T8, and T8, T9. Her medical history includes a spinal fusion from L1 to L3, which complicates the placement of the spinal cord stimulator leads.  She has osteoporosis, particularly noted in her left femur, with a T-score of -2.8. The bone density test could not assess her lumbar spine due to existing hardware, but it is suspected that osteoporosis is present there as well.  She was recently prescribed prednisone  for a week to manage inflammation in her arm and neck, which was causing significant pain and limited mobility. No use of blood thinners except for aspirin .         UDS:  Summary  Date Value Ref Range Status  10/30/2023 FINAL  Final    Comment:    ==================================================================== [employer] Select 13 (MW) ==================================================================== Test                             Result       Flag       Units  Drug Present and Declared for Prescription Verification   Hydrocodone                     1103         EXPECTED   ng/mg creat   Norhydrocodone                 1955         EXPECTED   ng/mg creat    Sources of hydrocodone  include scheduled prescription medications.    Norhydrocodone is an expected  metabolite of hydrocodone .  ==================================================================== Test                      Result    Flag   Units      Ref Range   Creatinine              33               mg/dL      >=79 ==================================================================== Declared Medications:  The flagging and interpretation on this report are based on the  following declared medications.  Unexpected results may arise from  inaccuracies in the declared medications.   **Note: The testing scope of this panel includes these medications:   Hydrocodone  (Norco)   **Note: The testing scope of this panel does not include the  following reported medications:   Acetaminophen  (Tylenol )  Acetaminophen  (Norco)  Albuterol  (Ventolin  HFA)  Aspirin   Atorvastatin  (Lipitor )  Budesonide (Breztri  Aerosphere)  Cephalexin  (Keflex )  Cinnamon  Citalopram  (Celexa )  Duloxetine  (Cymbalta )  Fish Oil  Formoterol  (Breztri  Aerosphere)  Gabapentin  (Neurontin )  Glycopyrrolate  (Breztri  Aerosphere)  Hydroxyzine  (Atarax )  Metoprolol  (Lopressor )  Multivitamin  Nitroglycerin  (Nitrostat )  Oxybutynin  (Ditropan )  Pantoprazole  (Protonix )  Pentosan (Elmiron )  Sulfasalazine  (Azulfidine )  Supplement  Tizanidine  (Zanaflex )  Torsemide  (Demadex )  Valsartan (Diovan)  Vitamin B12  Vitamin C  Vitamin D3 ==================================================================== For clinical consultation, please call [phone]. ====================================================================     No results found for: CBDTHCR No results found for: D8THCCBX No results found for: D9THCCBX  ROS  Constitutional: Denies any fever or chills Gastrointestinal: No reported hemesis, hematochezia, vomiting, or acute GI distress Musculoskeletal: Thoracic and lumbar spine pain and radiating pain to the legs Neurological: No reported episodes of acute onset apraxia, aphasia, dysarthria, agnosia,  amnesia, paralysis, loss of coordination, or loss of consciousness  Medication Review  Cinnamon, DULoxetine , Fish Oil, HYDROcodone -acetaminophen , Multiple Vitamins-Minerals, Pentosan Polysulfate Sodium , Turmeric, acetaminophen , albuterol , ascorbic acid , aspirin  EC, atorvastatin , budesonide-glycopyrrolate -formoterol , cephALEXin , cholecalciferol , citalopram , cyanocobalamin , feeding supplement, gabapentin , hydrOXYzine , metoprolol  tartrate, nitroGLYCERIN , oxybutynin , pantoprazole , sulfaSALAzine , tiZANidine , torsemide , and valsartan  History Review  Allergy: Ms. Bradstreet is allergic to aspirin  and sulfa antibiotics. Drug: Ms. Muscato  reports no history of drug use. Alcohol:  reports that she does not currently use alcohol. Tobacco:  reports that she quit smoking about 15 months ago. Her smoking use included cigarettes. She started smoking about 57 years ago. She has a 56.2 pack-year smoking history. She has been exposed to tobacco smoke. She has never used smokeless tobacco. Social: Ms. Sleep  reports that she quit smoking about 15 months ago. Her smoking use included cigarettes. She started smoking about 57 years ago. She has a 56.2 pack-year smoking history. She has been exposed to tobacco smoke. She has never used smokeless tobacco. She reports that she does not currently use alcohol. She reports that she does not use drugs. Medical:  has a past medical history of Anemia, Anxiety, Aortic atherosclerosis (HCC), Arthritis,  Bilateral carotid artery stenosis, CAD (coronary artery disease), native coronary artery (10/05/2022), Cerebral microvascular disease, Chronic lower back pain, Colitis, ulcerative (HCC), COPD (chronic obstructive pulmonary disease) (HCC), DDD (degenerative disc disease), cervical, Depression, Diastolic dysfunction (10/19/2022), Dyspnea, GERD (gastroesophageal reflux disease), Hiatal hernia, History of 2019 novel coronavirus disease (COVID-19) (02/2021), HLD (hyperlipidemia), Hypertension,  Interstitial cystitis, Long-term use of aspirin  therapy, Lumbar disc disease, Motion sickness, On chronic clopidogrel  therapy, Paraesophageal hernia, Parkinson's disease (HCC), Pulmonary hypertension (HCC) (04/18/2021), PVD (peripheral vascular disease) (HCC), T2DM (type 2 diabetes mellitus) (HCC), TIA (transient ischemic attack) (2015), Urinary frequency, and Wears dentures. Surgical: Ms. Brienza  has a past surgical history that includes Tonsillectomy; Cholecystectomy; Hysterectomy abdominal with salpingo-oophorectomy; Diagnostic laparoscopy; Bladder suspension; Colonoscopy; Knee arthroscopy (Left, 02/13/2014); Ankle arthroscopy (Left, 03/22/2016); Tendon repair (Left, 03/22/2016); Lumbar laminectomy/decompression microdiscectomy (N/A, 05/30/2017); Anterior cervical decomp/discectomy fusion (N/A, 04/09/2019); Lumbar laminectomy/decompression microdiscectomy (N/A, 05/27/2021); LEFT HEART CATH AND CORONARY ANGIOGRAPHY (N/A, 10/18/2022); CORONARY STENT INTERVENTION (N/A, 10/18/2022); Esophagogastroduodenoscopy (egd) with propofol  (N/A, 06/06/2023); Xi robotic assisted paraesophageal hernia repair (N/A, 06/19/2023); and Insertion of mesh (06/19/2023). Family: family history includes COPD in her father; Diabetes in her mother; Heart disease in her father; Hypertension in her father and mother.  Laboratory Chemistry Profile   Renal Lab Results  Component Value Date   BUN 8 06/23/2023   CREATININE 0.74 06/23/2023   GFRAA >60 04/02/2019   GFRNONAA >60 06/23/2023    Hepatic Lab Results  Component Value Date   AST 61 (H) 06/22/2023   ALT 40 06/22/2023   ALBUMIN  3.1 (L) 06/22/2023   ALKPHOS 36 (L) 06/22/2023    Electrolytes Lab Results  Component Value Date   NA 134 (L) 06/23/2023   K 3.8 06/23/2023   CL 99 06/23/2023   CALCIUM  9.0 06/23/2023   MG 1.9 06/23/2023    Bone No results found for: VD25OH, VD125OH2TOT, CI6874NY7, CI7874NY7, 25OHVITD1, 25OHVITD2, 25OHVITD3, TESTOFREE,  TESTOSTERONE  Inflammation (CRP: Acute Phase) (ESR: Chronic Phase) No results found for: CRP, ESRSEDRATE, LATICACIDVEN       Note: Above Lab results reviewed.  Recent Imaging Review  MR THORACIC SPINE WO CONTRAST CLINICAL DATA:  Mid back pain.  Osteoarthritis.  EXAM: MRI THORACIC SPINE WITHOUT CONTRAST  TECHNIQUE: Multiplanar, multisequence MR imaging of the thoracic spine was performed. No intravenous contrast was administered.  COMPARISON:  CT chest 06/21/2023  FINDINGS: Alignment: Mild scoliotic curvature convex to the left. Slightly increased kyphotic curvature. No listhesis.  Vertebrae: No regional fracture or focal bone lesion. No edematous endplate marrow changes or edematous facet arthritis.  Cord:  No focal cord lesion.  See below regarding stenosis.  Paraspinal and other soft tissues: Negative  Disc levels:  No significant finding from T1-2 through T4-5.  T5-6: Endplate osteophytes and mild bulging of the disc. No stenosis.  T6-7: Central disc herniation effaces the ventral subarachnoid space. This is slightly more prominent towards the right. Mild deformity of the right side of the cord but no frank cord compression. Foramina sufficiently patent.  T7-8: Central disc herniation effaces the ventral subarachnoid space and indents the ventral cord slightly. Ample subarachnoid space present dorsal to the cord. No foraminal encroachment.  T8-9: Central to slightly left-sided disc herniation effaces the ventral subarachnoid space and indents the ventral cord slightly. Ample subarachnoid space present dorsal to the cord. No foraminal stenosis.  T9-10 and T10-11: Mild noncompressive disc bulges. Mild facet osteoarthritis at the T10-11 level.  T11-12 and T12-L1: Negative  IMPRESSION: 1. Mild scoliotic curvature convex to the left. Slightly  increased kyphotic curvature. 2. Disc herniations at T6-7, T7-8 and T8-9 efface the ventral subarachnoid  space and indent the ventral cord slightly. Ample subarachnoid space present dorsal to the cord. No foraminal stenosis. These findings could certainly relate to regional back pain. 3. Mild non-compressive disc bulges at T5-6, T9-10 and T10-11. Mild facet osteoarthritis at T10-11.  Electronically Signed   By: Oneil Officer M.D.   On: 12/12/2023 13:22 Note: Reviewed        Physical Exam  General appearance: Well nourished, well developed, and well hydrated. In no apparent acute distress Mental status: Alert, oriented x 3 (person, place, & time)       Respiratory: No evidence of acute respiratory distress Eyes: PERLA Vitals: BP (!) 144/97   Pulse (!) 50   Temp (!) 97.5 F (36.4 C)   Ht 5' 4 (1.626 m)   Wt 198 lb (89.8 kg)   SpO2 97%   BMI 33.99 kg/m  BMI: Estimated body mass index is 33.99 kg/m as calculated from the following:   Height as of this encounter: 5' 4 (1.626 m).   Weight as of this encounter: 198 lb (89.8 kg). Ideal: Ideal body weight: 54.7 kg (120 lb 9.5 oz) Adjusted ideal body weight: 68.7 kg (151 lb 8.9 oz)  Assessment   Diagnosis Status  1. Failed back surgical syndrome   2. Lumbar post-laminectomy syndrome   3. Lumbar radiculopathy   4. Chronic pain syndrome    Persistent Persistent Persistent   Updated Problems: No problems updated.  Plan of Care  The patient presents with chronic, intractable pain involving both mechanical low back pain and neurogenic components radiating into the lower extremities. Given the patient's history of failed back surgery syndrome and radiculopathy, and limited response to conservative and interventional therapies, a spinal cord stimulator (SCS) trial is being considered. While SCS is typically more effective for neuropathic and appendicular pain, we discussed that some patients may also experience relief in axial low back and hip pain. The potential benefits and risks of spinal cord stimulation were thoroughly  reviewed.  The proposed plan includes a percutaneous spinal cord stimulator trial. The patient was informed that this will involve temporary placement of epidural leads connected to an external pulse generator, which will be used over a 7-day trial period. We discussed the possibility of a mid-trial in-office visit to adjust settings and optimize programming in order to give the patient the best chance of success. The patient will receive daily support from the device representative throughout the trial.  We reviewed the benefits of SCS, which include potential substantial pain relief, reduction in the use of oral pain medications including opioids, and long-term programmable therapy that can reduce reliance on repeated injections and other pain interventions. Risks were also discussed in detail and include potential surgical complications such as infection, bleeding, CSF leak, lead migration or fracture, hardware malfunction, and the possibility of either no pain relief or worsening of symptoms. The patient was advised that spinal cord stimulation is not a guaranteed solution or a "magic bullet," but rather a potentially valuable therapy in appropriately selected cases.  I have reviewed her thoracic MRI with her as well as her lumbar MRI.  I have explained to her that she is at higher risk given her thoracic disc herniations and laminar arthroses which could make placement of percutaneous leads more challenging.  She is aware of these risks and willing to proceed.  Patient has completed psychological evaluation. We had a thorough and  detailed discussion reviewing the rationale, alternatives, risks, and expected outcomes. The patient stated that all questions were answered to their satisfaction, demonstrated appropriate understanding, and expressed readiness to proceed. There were no barriers to understanding the treatment plan, and the explanation was well received. The patient is eager to move forward with  the spinal cord stimulator trial once the necessary evaluation is complete.  No orders of the defined types were placed in this encounter.  Orders:  Orders Placed This Encounter  Procedures   [place] TRIAL    Contact medical implant company representative to notify them of the scheduled case and to make sure they will be available to provide required equipment.    Standing Status:   Future    Expiration Date:   04/16/2024    Scheduling Instructions:     Side: Bilateral     Level: Lumbar     Device: Medtronic     Sedation: With sedation     Timeframe: As soon as pre-approved    Where will this procedure be performed?:   [facility]   Ambulatory referral to Psychology    Referral Priority:   Routine    Referral Type:   Psychiatric    Referral Reason:   Specialty Services Required    Requested Specialty:   Psychology    Number of Visits Requested:   1    Return in about 5 weeks (around 02/18/2024) for Medtronic SCS trial, ECT.    Recent Visits Date Type Provider Dept  11/08/23 Office Visit Marcelino Nurse, MD [facility]  10/30/23 Office Visit Marcelino Nurse, MD [facility]  Showing recent visits within past 90 days and meeting all other requirements Today's Visits Date Type Provider Dept  01/15/24 Office Visit Marcelino Nurse, MD [facility]  Showing today's visits and meeting all other requirements Future Appointments No visits were found meeting these conditions. Showing future appointments within next 90 days and meeting all other requirements  I discussed the assessment and treatment plan with the patient. The patient was provided an opportunity to ask questions and all were answered. The patient agreed with the plan and demonstrated an understanding of the instructions.  Patient advised to call back or seek an in-person evaluation if the symptoms or condition worsens.  Duration of encounter: .  Total time on encounter, as per AMA  guidelines included both the face-to-face and non-face-to-face time personally spent by the physician and/or other qualified health care professional(s) on the day of the encounter (includes time in activities that require the physician or other qualified health care professional and does not include time in activities normally performed by clinical staff). Physician's time may include the following activities when performed: Preparing to see the patient (e.g., pre-charting review of records, searching for previously ordered imaging, lab work, and nerve conduction tests) Review of prior analgesic pharmacotherapies. Reviewing PMP Interpreting ordered tests (e.g., lab work, imaging, nerve conduction tests) Performing post-procedure evaluations, including interpretation of diagnostic procedures Obtaining and/or reviewing separately obtained history Performing a medically appropriate examination and/or evaluation Counseling and educating the patient/family/caregiver Ordering medications, tests, or procedures Referring and communicating with other health care professionals (when not separately reported) Documenting clinical information in the electronic or other health record Independently interpreting results (not separately reported) and communicating results to the patient/ family/caregiver Care coordination (not separately reported)  Note by: Nurse Marcelino, MD (TTS and AI technology used. I apologize for any typographical errors that were not detected and corrected.) Date: 01/15/2024;  Time: 4:08 PM

## 2024-01-15 NOTE — Patient Instructions (Addendum)
 Medtronic SCS trial- give pt Clinical cytogeneticist for psych eval, fax results to us  Clean back off with soap solution night before procedure    ______________________________________________________________________    Procedure instructions  Stop blood-thinners  Do not eat or drink fluids (other than water) for 6 hours before your procedure  No water for 2 hours before your procedure  Take your blood pressure medicine with a sip of water  Arrive 30 minutes before your appointment  If sedation is planned, bring suitable driver. [place], [place], & public transportation are NOT APPROVED)  Carefully read the Preparing for your procedure detailed instructions  If you have questions call us  at [phone]  Procedure appointments are for procedures only.   NO medication refills or new problem evaluations will be done on procedure days.   Only the scheduled, pre-approved procedure and side will be done.   ______________________________________________________________________

## 2024-01-22 ENCOUNTER — Encounter: Admitting: Student in an Organized Health Care Education/Training Program

## 2024-01-23 ENCOUNTER — Telehealth: Payer: Self-pay | Admitting: Student in an Organized Health Care Education/Training Program

## 2024-01-23 NOTE — Telephone Encounter (Signed)
Spoke with patient and answered her questions. Patient verbalized understanding.

## 2024-01-23 NOTE — Telephone Encounter (Signed)
 PT called states that she have a hair appointment on the same day as her procedure. PT want to know will she be okay to go to her appointment. Please give patient a call. TY

## 2024-02-06 ENCOUNTER — Encounter: Payer: Self-pay | Admitting: Student in an Organized Health Care Education/Training Program

## 2024-02-06 ENCOUNTER — Other Ambulatory Visit: Payer: Self-pay | Admitting: Student in an Organized Health Care Education/Training Program

## 2024-02-06 ENCOUNTER — Ambulatory Visit
Admission: RE | Admit: 2024-02-06 | Discharge: 2024-02-06 | Disposition: A | Discharge status: Home / Self Care | Source: Ambulatory Visit | Attending: Student in an Organized Health Care Education/Training Program | Admitting: Student in an Organized Health Care Education/Training Program

## 2024-02-06 ENCOUNTER — Ambulatory Visit (HOSPITAL_BASED_OUTPATIENT_CLINIC_OR_DEPARTMENT_OTHER): Admitting: Student in an Organized Health Care Education/Training Program

## 2024-02-06 DIAGNOSIS — M961 Postlaminectomy syndrome, not elsewhere classified: Secondary | ICD-10-CM

## 2024-02-06 DIAGNOSIS — G894 Chronic pain syndrome: Secondary | ICD-10-CM | POA: Diagnosis present

## 2024-02-06 DIAGNOSIS — M5416 Radiculopathy, lumbar region: Secondary | ICD-10-CM | POA: Insufficient documentation

## 2024-02-06 MED ORDER — CEPHALEXIN 500 MG PO CAPS: 500.0000 mg | ORAL_CAPSULE | Freq: Four times a day (QID) | ORAL | 0 refills | Status: AC

## 2024-02-06 MED ORDER — MIDAZOLAM HCL 5 MG/5ML IJ SOLN
0.5000 mg | Freq: Once | INTRAMUSCULAR | Status: AC
  Administered 2024-02-06: 2 mg via INTRAVENOUS

## 2024-02-06 MED ORDER — CEFAZOLIN SODIUM-DEXTROSE 2-4 GM/100ML-% IV SOLN
2.0000 g | Freq: Once | INTRAVENOUS | Status: AC
  Administered 2024-02-06: 2 g via INTRAVENOUS
  Filled 2024-02-06: qty 100

## 2024-02-06 MED ORDER — LIDOCAINE HCL 2 % IJ SOLN
20.0000 mL | Freq: Once | INTRAMUSCULAR | Status: AC
  Administered 2024-02-06: 400 mg

## 2024-02-06 MED ORDER — MIDAZOLAM HCL 5 MG/5ML IJ SOLN
INTRAMUSCULAR | Status: AC
Start: 2024-02-06 — End: 2024-02-06
  Filled 2024-02-06: qty 5

## 2024-02-06 MED ORDER — LACTATED RINGERS IV SOLN: Freq: Once | INTRAVENOUS | Status: AC

## 2024-02-06 MED ORDER — FENTANYL CITRATE (PF) 100 MCG/2ML IJ SOLN
INTRAMUSCULAR | Status: AC
Start: 2024-02-06 — End: 2024-02-06
  Filled 2024-02-06: qty 2

## 2024-02-06 MED ORDER — LIDOCAINE HCL 2 % IJ SOLN
INTRAMUSCULAR | Status: AC
  Filled 2024-02-06: qty 20

## 2024-02-06 MED ORDER — CEFAZOLIN SODIUM 1 G IJ SOLR
INTRAMUSCULAR | Status: AC
  Filled 2024-02-06: qty 20

## 2024-02-06 MED ORDER — ROPIVACAINE HCL 2 MG/ML IJ SOLN
18.0000 mL | Freq: Once | INTRAMUSCULAR | Status: AC
  Administered 2024-02-06: 18 mL via PERINEURAL

## 2024-02-06 MED ORDER — FENTANYL CITRATE (PF) 100 MCG/2ML IJ SOLN
25.0000 ug | INTRAMUSCULAR | Status: DC | PRN
  Administered 2024-02-06: 50 ug via INTRAVENOUS

## 2024-02-06 MED ORDER — ROPIVACAINE HCL 2 MG/ML IJ SOLN
INTRAMUSCULAR | Status: AC
  Filled 2024-02-06: qty 20

## 2024-02-06 NOTE — Progress Notes (Signed)
 PROVIDER NOTE: Interpretation of information contained herein should be left to medically-trained personnel. Specific patient instructions are provided elsewhere under Patient Instructions section of medical record. This document was created in part using STT-dictation technology, any transcriptional errors that may result from this process are unintentional.  Patient: Tammy Lamb Type: Established DOB: 1951-07-04 MRN: [id number] PCP: Sherial Bail, MD  Service: Procedure DOS: 02/06/2024 Setting: Ambulatory Location: Ambulatory outpatient facility Delivery: Face-to-face Provider: Wallie Sherry, MD Specialty: Interventional Pain Management Specialty designation: 09 Location: Outpatient facility Ref. Prov.: Sherry Wallie, MD       Interventional Therapy   Primary Reason for Admission: Surgical management of chronic pain condition.   Procedure:              Type: Trial Spinal Cord Neurostimulator Implant (Percutaneous, interlaminar, posterior epidural placement) Laterality: Bilateral (-50)  Level: Lumbar  Imaging: Fluoroscopic guidance Anesthesia: Local anesthesia (1-2% Lidocaine ) Sedation: Minimal Sedation                       DOS: 02/06/2024  Performed by: Wallie Sherry, MD  Purpose: Diagnostic. To determine if a permanent implant may be effective in controlling some or all of [name]'s chronic pain symptoms.  Rationale (medical necessity): procedure needed and proper for the diagnosis and/or treatment of [name]'s medical symptoms and needs. 1. Failed back surgical syndrome   2. Lumbar post-laminectomy syndrome   3. Lumbar radiculopathy   4. Chronic pain syndrome    NAS-11 Pain score:   Pre-procedure: 7 /10   Post-procedure: 7 /10     Target: Posterior epidural space over the dorsal columns of the spinal cord. Location: Posterior intraspinal canal Region: Thoracolumbar  Approach: Translaminar percutaneous  Type of procedure: Surgical   Position / Prep /  Materials:  Position: Prone  Prep solution: ChloraPrep (2% chlorhexidine  gluconate and 70% isopropyl alcohol) Prep Area: Entire  Posterior  Thoracolumbar  Region  Materials:  Tray: Implant tray Needle(s):  Type: Epidural  Gauge (G): 14 Length: Regular (10cm)  Qty: 2  H&P (Pre-op Assessment):  Tammy Lamb is a 73 y.o. (year old), female patient, seen today for interventional treatment. She  has a past surgical history that includes Tonsillectomy; Cholecystectomy; Hysterectomy abdominal with salpingo-oophorectomy; Diagnostic laparoscopy; Bladder suspension; Colonoscopy; Knee arthroscopy (Left, 02/13/2014); Ankle arthroscopy (Left, 03/22/2016); Tendon repair (Left, 03/22/2016); Lumbar laminectomy/decompression microdiscectomy (N/A, 05/30/2017); Anterior cervical decomp/discectomy fusion (N/A, 04/09/2019); Lumbar laminectomy/decompression microdiscectomy (N/A, 05/27/2021); LEFT HEART CATH AND CORONARY ANGIOGRAPHY (N/A, 10/18/2022); CORONARY STENT INTERVENTION (N/A, 10/18/2022); Esophagogastroduodenoscopy (egd) with propofol  (N/A, 06/06/2023); Xi robotic assisted paraesophageal hernia repair (N/A, 06/19/2023); and Insertion of mesh (06/19/2023).  Initial Vital Signs:  Pulse/EKG Rate: (!) 53ECG Heart Rate: (!) 54 Temp: (!) 97.2 F (36.2 C) Resp: 18 BP: (!) 180/110 SpO2: 99 %  BMI: Estimated body mass index is 34.33 kg/m as calculated from the following:   Height as of this encounter: 5' 4 (1.626 m).   Weight as of this encounter: 200 lb (90.7 kg).  Risk Assessment: Allergies: Reviewed. She is allergic to aspirin  and sulfa antibiotics.  Allergy Precautions: None required Coagulopathies: Reviewed. None identified.  Blood-thinner therapy: None at this time Active Infection(s): Reviewed. None identified. Tammy Lamb is afebrile  Site Confirmation: Tammy Lamb was asked to confirm the procedure and laterality before marking the site, which she did. Procedure checklist: Completed Consent: Before  the procedure and under the influence of no sedative(s), amnesic(s), or anxiolytics, the patient was informed of the treatment options, risks and  possible complications. To fulfill our ethical and legal obligations, as recommended by the American Medical Association's Code of Ethics, I have informed the patient of my clinical impression; the nature and purpose of the treatment or procedure; the risks, benefits, and possible complications of the intervention; the alternatives, including doing nothing; the risk(s) and benefit(s) of the alternative treatment(s) or procedure(s); and the risk(s) and benefit(s) of doing nothing.  Tammy Lamb was provided with information about the general risks and possible complications associated with most interventional procedures. These include, but are not limited to: failure to achieve desired goals, infection, bleeding, organ or nerve damage, allergic reactions, paralysis, and/or death.  In addition, she was informed of those risks and possible complications associated to this particular procedure, which include, but are not limited to: damage to the implant; failure to decrease pain; local, systemic, or serious CNS infections, intraspinal abscess with possible cord compression and paralysis, or life-threatening such as meningitis; intrathecal and/or epidural bleeding with formation of hematoma with possible spinal cord compression and permanent paralysis; organ damage; nerve injury or damage with subsequent sensory, motor, and/or autonomic system dysfunction, resulting in transient or permanent pain, numbness, and/or weakness of one or several areas of the body; allergic reactions, either minor or major life-threatening, such as anaphylactic or anaphylactoid reactions.  Furthermore, Tammy Lamb was informed of those risks and complications associated with the medications. These include, but are not limited to: allergic reactions (i.e.: anaphylactic or anaphylactoid reactions);  arrhythmia;  Hypotension/hypertension; cardiovascular collapse; respiratory depression and/or shortness of breath; swelling or edema; medication-induced neural toxicity; particulate matter embolism and blood vessel occlusion with resultant organ, and/or nervous system infarction and permanent paralysis.  Finally, she was informed that Medicine is not an exact science; therefore, there is also the possibility of unforeseen or unpredictable risks and/or possible complications that may result in a catastrophic outcome. The patient indicated having understood very clearly. We have given the patient no guarantees and we have made no promises. Enough time was given to the patient to ask questions, all of which were answered to the patient's satisfaction. Ms. Gohr has indicated that she wanted to continue with the procedure. Attestation: I, the ordering provider, attest that I have discussed with the patient the benefits, risks, side-effects, alternatives, likelihood of achieving goals, and potential problems during recovery for the procedure that I have provided informed consent. Date  Time: 02/06/2024  8:07 AM  Pre-Procedure Preparation:  Monitoring: As per clinic protocol. Respiration, ETCO2, SpO2, BP, heart rate and rhythm monitor placed and checked for adequate function Safety Precautions: Patient was assessed for positional comfort and pressure points before starting the procedure. Time-out: I initiated and conducted the Time-out before starting the procedure, as per protocol. The patient was asked to participate by confirming the accuracy of the Time Out information. Verification of the correct person, site, and procedure were performed and confirmed by me, the nursing staff, and the patient. Time-out conducted as per Joint Commission's Universal Protocol (UP.01.01.01). Time: 0927 Start Time: 0927 hrs.  Description/Narrative of Procedure:          Rationale (medical necessity): procedure needed  and proper for the diagnosis and/or treatment of the patient's medical symptoms and needs. Procedural Technique Safety Precautions: Aspiration looking for blood return was conducted prior to all injections. At no point did we inject any substances, as a needle was being advanced. No attempts were made at seeking any paresthesias. Safe injection practices and needle disposal techniques used. Medications properly checked for expiration dates.  SDV (single dose vial) medications used. Description of the Procedure: Protocol guidelines were followed. The patient was assisted into a comfortable position. The target area was identified and the area prepped in the usual manner. Skin & deeper tissues infiltrated with local anesthetic. Appropriate amount of time allowed to pass for local anesthetics to take effect. The procedure needles were then advanced to the target area. Proper needle placement secured. Negative aspiration confirmed. Solution injected in intermittent fashion, asking for systemic symptoms every 0.5cc of injectate. The needles were then removed and the area cleansed, making sure to leave some of the prepping solution back to take advantage of its long term bactericidal properties.  Technical description of procedure: Availability of a responsible, adult driver, and NPO status confirmed. Informed consent was obtained after having discussed risks and possible complications. An IV was started. The patient was then taken to the fluoroscopy suite, where the patient was placed in position for the procedure, over the fluoroscopy table. The patient was then monitored in the usual manner. Fluoroscopy was manipulated to obtain the best possible view of the target. Parallex error was corrected before commencing the procedure. Once a clear view of the target had been obtained, the skin and deeper tissues over the procedure site were infiltrated using lidocaine , loaded in a 10 cc luer-loc syringe with a 0.5 inch,  25-G needle. The introducer needle(s) was/were then inserted through the skin and deeper tissues. A paramidline approach was used to enter the posterior epidural space at a 30 angle, using "Loss-of-resistance Technique" with 3 ml of PF-NaCl (0.9% NSS). Correct needle placement was confirmed in the antero-posterior and lateral fluoroscopic views. The lead was gently introduced and manipulated under real-time fluoroscopy, constantly assessing for pain, discomfort, or paresthesias, until the tip rested at the desired level. Both sides were done in identical fashion. Electrode placement was tested until appropriate coverage was attained. Once the patient confirmed that the stimulation was over the desired area, the lead(s) was/were secured in place and the introducer needles removed. This was done under real-time fluoroscopy while observing the electrode tip to avoid unintended migration. The area was covered with a non-occlusive dressing and the patient transported to recovery for further programming.  Vitals:   02/06/24 0959 02/06/24 1002 02/06/24 1012 02/06/24 1022  BP: (!) 160/92 (!) 162/93 139/86 (!) 149/84  Pulse:      Resp: 17 17 16 15   Temp:      TempSrc:      SpO2: 95% 99% 96% 97%  Weight:      Height:        Start Time: 0927 hrs. End Time: 1002 hrs.  Neurostimulator Details:  Lead(s):  Brand: Medtronic         Epidural Access Level:  T12-L1 T12-L1  Lead implant:  Bilateral   No. of Electrodes/Lead:  8 8  Laterality:  Left Right  Top electrode location:  T8 T8  Model No.: [id number] same  Length: 60cm 60  Lot No.: [id number]  MRI compatibility:  Conditional           External Neurostimulator    Serial [id number]    Imaging Guidance (Spinal):         Type of Imaging Technique: Fluoroscopy Guidance (Spinal) Indication(s): Fluoroscopy guidance for needle placement to enhance accuracy in procedures requiring precise needle localization for targeted delivery of medication  in or near specific anatomical locations not easily accessible without such real-time imaging assistance. Exposure Time: Please see nurses notes. Contrast: None used.  Fluoroscopic Guidance: I was personally present during the use of fluoroscopy. Tunnel Vision Technique used to obtain the best possible view of the target area. Parallax error corrected before commencing the procedure. Direction-depth-direction technique used to introduce the needle under continuous pulsed fluoroscopy. Once target was reached, antero-posterior, oblique, and lateral fluoroscopic projection used confirm needle placement in all planes. Images permanently stored in EMR. Interpretation: No contrast injected. I personally interpreted the imaging intraoperatively. Adequate needle placement confirmed in multiple planes. Permanent images saved into the patient's record.      Antibiotic Prophylaxis:   Anti-infectives (From admission, onward)    Start     Dose/Rate Route Frequency Ordered Stop   02/06/24 0930  ceFAZolin  (ANCEF ) IVPB 2g/100 mL premix        2 g 200 mL/hr over 30 Minutes Intravenous  Once 02/06/24 0901 02/06/24 1002   02/06/24 0000  cephALEXin  (KEFLEX ) 500 MG capsule        500 mg Oral 4 times daily 02/06/24 0843 02/13/24 2359      Indication(s): Implant Prophylaxis.  Post-operative Assessment:  Post-procedure Vital Signs:  Pulse/HCG Rate: (!) 53(!) 58 Temp: (!) 97.2 F (36.2 C) Resp: 15 BP: (!) 149/84 SpO2: 97 %  Complications: No immediate post-treatment complications observed by team, or reported by patient.  Note: The patient tolerated the entire procedure well. A repeat set of vitals were taken after the procedure and the patient was kept under observation following institutional policy, for this type of procedure. Post-procedural neurological assessment was performed, showing return to baseline, prior to discharge. The patient was provided with post-procedure discharge instructions,  including a section on how to identify potential problems. Should any problems arise concerning this procedure, the patient was given instructions to immediately contact us , at any time, without hesitation. In any case, we plan to contact the patient by telephone for a follow-up status report regarding this interventional procedure.  Comments:  No additional relevant information.  Plan of Care Orders:  No orders of the defined types were placed in this encounter.   Hydrocodone  7.5mg  TID prn 90/month    Medications administered: We administered lidocaine , ropivacaine  (PF) 2 mg/mL (0.2%), ceFAZolin , lactated ringers , midazolam , and fentaNYL .  See the medical record for exact dosing, route, and time of administration.   Follow-up plan:   Return in about 1 week (around 02/13/2024) for SCS lead pull.     Recent Visits Date Type Provider Dept  01/15/24 Office Visit Marcelino Nurse, MD [facility]  11/08/23 Office Visit Marcelino Nurse, MD [facility]  Showing recent visits within past 90 days and meeting all other requirements Today's Visits Date Type Provider Dept  02/06/24 Procedure visit Marcelino Nurse, MD [facility]  Showing today's visits and meeting all other requirements Future Appointments Date Type Provider Dept  02/13/24 Appointment Marcelino Nurse, MD [facility]  Showing future appointments within next 90 days and meeting all other requirements  Disposition: Discharge home  Discharge (Date  Time): 02/06/2024;   hrs.   Primary Care Physician: Sherial Bail, MD Location: [facility] Note by: Nurse Marcelino, MD (TTS technology used. I apologize for any typographical errors that were not detected and corrected.) Date: 02/06/2024; Time: 10:41 AM

## 2024-02-06 NOTE — Patient Instructions (Signed)
 Today we did the following -We have done a Spinal Cord Stimulator Trial with Medtronic  -As long as the leads are in place, do not bathe or shower. You may sponge bathe.  -While the lead is in place, please limit the bending, lifting, or twisting because the lead can move.  -The things we want to see is if your pain improves (and by what percentage), if you can do more activity (don't overdo it), and if you can use less of your as needed medicine. Do not stop long acting medicines like methadone, oxycontin , MS Contin , etc without checking with us .  -It is VERY important that you pick up the antibiotics we prescribed, Keflex , on your way home from the trial and take them as prescribed(4 times a day), starting today, for as long as the lead is in place.  -The Spina Cord Stimulator Representative will be in contact with you while the lead is in place to make sure the trial goes as well as possible.  -Please contact us  with any questions or concerns at any time during the trial.   -If you start running a fever over 100 degrees, have severe back pain, or new pain running down the legs, or drainage coming from the lead site, contact us  immediately and/or go to the emergency room.  -Please do not restart any sort of medication that can thin your blood such as Aspirin , ibuprofen, motrin, aleve, plavix , coumadin, etc. If you aren't sure, call and ask.  -We will have you return on Monday to have the lead removed. If this is successful, at that point we can go over the details about the permanent implant.

## 2024-02-06 NOTE — Progress Notes (Signed)
 Safety precautions to be maintained throughout the outpatient stay will include: orient to surroundings, keep bed in low position, maintain call bell within reach at all times, provide assistance with transfer out of bed and ambulation.

## 2024-02-07 ENCOUNTER — Telehealth: Payer: Self-pay

## 2024-02-07 NOTE — Telephone Encounter (Signed)
 Post procedure follow up.  Patient states she is doing ok.

## 2024-02-11 ENCOUNTER — Other Ambulatory Visit: Payer: Self-pay | Admitting: Cardiology

## 2024-02-11 ENCOUNTER — Other Ambulatory Visit: Payer: Self-pay | Admitting: Student in an Organized Health Care Education/Training Program

## 2024-02-11 DIAGNOSIS — M5124 Other intervertebral disc displacement, thoracic region: Secondary | ICD-10-CM

## 2024-02-11 DIAGNOSIS — G894 Chronic pain syndrome: Secondary | ICD-10-CM

## 2024-02-11 DIAGNOSIS — G8929 Other chronic pain: Secondary | ICD-10-CM

## 2024-02-11 DIAGNOSIS — M4692 Unspecified inflammatory spondylopathy, cervical region: Secondary | ICD-10-CM

## 2024-02-11 DIAGNOSIS — M5030 Other cervical disc degeneration, unspecified cervical region: Secondary | ICD-10-CM

## 2024-02-11 DIAGNOSIS — M5414 Radiculopathy, thoracic region: Secondary | ICD-10-CM

## 2024-02-13 ENCOUNTER — Ambulatory Visit: Admitting: Student in an Organized Health Care Education/Training Program

## 2024-02-13 ENCOUNTER — Telehealth: Payer: Self-pay | Admitting: Student in an Organized Health Care Education/Training Program

## 2024-02-13 ENCOUNTER — Other Ambulatory Visit: Payer: Self-pay | Admitting: Student in an Organized Health Care Education/Training Program

## 2024-02-13 ENCOUNTER — Encounter: Payer: Self-pay | Admitting: Student in an Organized Health Care Education/Training Program

## 2024-02-13 ENCOUNTER — Ambulatory Visit
Admission: RE | Admit: 2024-02-13 | Discharge: 2024-02-13 | Disposition: A | Discharge status: Home / Self Care | Source: Ambulatory Visit | Attending: Student in an Organized Health Care Education/Training Program | Admitting: Student in an Organized Health Care Education/Training Program

## 2024-02-13 VITALS — BP 148/85 | HR 54 | Temp 97.8°F | Ht 65.0 in | Wt 200.0 lb

## 2024-02-13 DIAGNOSIS — M961 Postlaminectomy syndrome, not elsewhere classified: Secondary | ICD-10-CM

## 2024-02-13 DIAGNOSIS — M5414 Radiculopathy, thoracic region: Secondary | ICD-10-CM | POA: Diagnosis present

## 2024-02-13 DIAGNOSIS — M5030 Other cervical disc degeneration, unspecified cervical region: Secondary | ICD-10-CM | POA: Insufficient documentation

## 2024-02-13 DIAGNOSIS — M5412 Radiculopathy, cervical region: Secondary | ICD-10-CM

## 2024-02-13 DIAGNOSIS — G8929 Other chronic pain: Secondary | ICD-10-CM | POA: Diagnosis present

## 2024-02-13 DIAGNOSIS — G894 Chronic pain syndrome: Secondary | ICD-10-CM | POA: Insufficient documentation

## 2024-02-13 DIAGNOSIS — M5124 Other intervertebral disc displacement, thoracic region: Secondary | ICD-10-CM | POA: Diagnosis present

## 2024-02-13 DIAGNOSIS — M4692 Unspecified inflammatory spondylopathy, cervical region: Secondary | ICD-10-CM | POA: Insufficient documentation

## 2024-02-13 MED ORDER — HYDROCODONE-ACETAMINOPHEN 7.5-325 MG PO TABS: 1.0000 | ORAL_TABLET | Freq: Four times a day (QID) | ORAL | 0 refills | Status: DC | PRN

## 2024-02-13 MED ORDER — GABAPENTIN 300 MG PO CAPS: 900.0000 mg | ORAL_CAPSULE | Freq: Two times a day (BID) | ORAL | 5 refills | Status: DC

## 2024-02-13 MED ORDER — TIZANIDINE HCL 4 MG PO TABS: 4.0000 mg | ORAL_TABLET | Freq: Two times a day (BID) | ORAL | 5 refills | Status: DC | PRN

## 2024-02-13 MED ORDER — HYDROCODONE-ACETAMINOPHEN 7.5-325 MG PO TABS: 1.0000 | ORAL_TABLET | Freq: Four times a day (QID) | ORAL | 0 refills | Status: AC | PRN

## 2024-02-13 NOTE — Patient Instructions (Signed)

## 2024-02-13 NOTE — Progress Notes (Signed)
 Nursing Pain Medication Assessment:  Safety precautions to be maintained throughout the outpatient stay will include: orient to surroundings, keep bed in low position, maintain call bell within reach at all times, provide assistance with transfer out of bed and ambulation.  Medication Inspection Compliance: Pill count conducted under aseptic conditions, in front of the patient. Neither the pills nor the bottle was removed from the patient's sight at any time. Once count was completed pills were immediately returned to the patient in their original bottle.  Medication: Hydrocodone /APAP Pill/Patch Count: 3 of 90 pills/patches remain Pill/Patch Appearance: Markings consistent with prescribed medication Bottle Appearance: Standard pharmacy container. Clearly labeled. Filled Date: 06 / 19 / 2025 Last Medication intake:  Today

## 2024-02-13 NOTE — Progress Notes (Signed)
 PROVIDER NOTE: Interpretation of information contained herein should be left to medically-trained personnel. Specific patient instructions are provided elsewhere under Patient Instructions section of medical record. This document was created in part using AI and STT-dictation technology, any transcriptional errors that may result from this process are unintentional.  Patient: Tammy Lamb Main  Service: E/M   PCP: Sherial Bail, MD  DOB: 02/17/1951  DOS: 02/13/2024  Provider: Wallie Sherry, MD  MRN: [id number]  Delivery: Face-to-face  Specialty: Interventional Pain Management  Type: Established Patient  Setting: Ambulatory outpatient facility  Specialty designation: 09  Referring Prov.: Sherial Bail, MD  Location: Outpatient office facility       History of present illness (HPI) [name], a 73 y.o. year old female, is here today because of her Failed back surgical syndrome [M96.1]. [name] primary complain today is Back Pain  Pertinent problems: [name] has Neurogenic claudication; Spinal stenosis, lumbar region, with neurogenic claudication; Lumbar radiculopathy; Lumbar degenerative disc disease; Lumbar facet arthropathy; Chronic pain syndrome; Compression fracture of lumbar vertebra, non-traumatic, sequela; Lumbar post-laminectomy syndrome; Cervical facet joint syndrome; Chronic radicular cervical pain; Inflammatory spondylopathy of cervical region [facility]); and S/P cervical spinal fusion (C3-C4 ACDF (03/2019) on their pertinent problem list.  Pain Assessment: Severity of Chronic pain is reported as a 9 /10. Location: Back Lower/radiates down left leg. Onset: More than a month ago. Quality: Burning, Aching, Shooting. Timing: Constant. Modifying factor(s): nothing. Vitals:  height is 5' 5 (1.651 m) and weight is 200 lb (90.7 kg). Her temperature is 97.8 F (36.6 C). Her blood pressure is 148/85 (abnormal) and her pulse is 54 (abnormal). Her oxygen saturation is 94%.  BMI: Estimated  body mass index is 33.28 kg/m as calculated from the following:   Height as of this encounter: 5' 5 (1.651 m).   Weight as of this encounter: 200 lb (90.7 kg).  Last encounter: 01/15/2024. Last procedure: 02/06/2024.  Reason for encounter: medication management and postprocedural evaluation for spinal cord stimulator trial equal.  Of note patient states that she had 50% pain relief during the duration of her SCS trial.  She states that the first 3 to 4 days she noticed or benefits and was able to walk a longer distance and was utilizing less hydrocodone .  She states that she would like to think about this further before moving forward with a implant.  She is also here today for medication management.  Refill of her hydrocodone , tizanidine , gabapentin .  No change in dose.  UDS is up-to-date and appropriate   Pharmacotherapy Assessment   Hydrocodone  7.5mg  TID prn 90/month   Monitoring: [place] PMP: PDMP reviewed during this encounter.       Pharmacotherapy: No side-effects or adverse reactions reported. Compliance: No problems identified. Effectiveness: Clinically acceptable.  Rebecka Wolm HERO, RN  02/13/2024  8:03 AM  Sign when Signing Visit Nursing Pain Medication Assessment:  Safety precautions to be maintained throughout the outpatient stay will include: orient to surroundings, keep bed in low position, maintain call bell within reach at all times, provide assistance with transfer out of bed and ambulation.  Medication Inspection Compliance: Pill count conducted under aseptic conditions, in front of the patient. Neither the pills nor the bottle was removed from the patient's sight at any time. Once count was completed pills were immediately returned to the patient in their original bottle.  Medication: Hydrocodone /APAP Pill/Patch Count: 3 of 90 pills/patches remain Pill/Patch Appearance: Markings consistent with prescribed medication Bottle Appearance: Standard pharmacy container. Clearly  labeled. Filled Date: 06 / 19 / 2025 Last Medication intake:  Today    UDS:  Summary  Date Value Ref Range Status  10/30/2023 FINAL  Final    Comment:    ==================================================================== [employer] Select 13 (MW) ==================================================================== Test                             Result       Flag       Units  Drug Present and Declared for Prescription Verification   Hydrocodone                     1103         EXPECTED   ng/mg creat   Norhydrocodone                 1955         EXPECTED   ng/mg creat    Sources of hydrocodone  include scheduled prescription medications.    Norhydrocodone is an expected metabolite of hydrocodone .  ==================================================================== Test                      Result    Flag   Units      Ref Range   Creatinine              33               mg/dL      >=79 ==================================================================== Declared Medications:  The flagging and interpretation on this report are based on the  following declared medications.  Unexpected results may arise from  inaccuracies in the declared medications.   **Note: The testing scope of this panel includes these medications:   Hydrocodone  (Norco)   **Note: The testing scope of this panel does not include the  following reported medications:   Acetaminophen  (Tylenol )  Acetaminophen  (Norco)  Albuterol  (Ventolin  HFA)  Aspirin   Atorvastatin  (Lipitor )  Budesonide (Breztri  Aerosphere)  Cephalexin  (Keflex )  Cinnamon  Citalopram  (Celexa )  Duloxetine  (Cymbalta )  Fish Oil  Formoterol  (Breztri  Aerosphere)  Gabapentin  (Neurontin )  Glycopyrrolate  (Breztri  Aerosphere)  Hydroxyzine  (Atarax )  Metoprolol  (Lopressor )  Multivitamin  Nitroglycerin  (Nitrostat )  Oxybutynin  (Ditropan )  Pantoprazole  (Protonix )  Pentosan (Elmiron )  Sulfasalazine  (Azulfidine )  Supplement  Tizanidine   (Zanaflex )  Torsemide  (Demadex )  Valsartan (Diovan)  Vitamin B12  Vitamin C  Vitamin D3 ==================================================================== For clinical consultation, please call [phone]. ====================================================================     No results found for: CBDTHCR No results found for: D8THCCBX No results found for: D9THCCBX  ROS  Constitutional: Denies any fever or chills Gastrointestinal: No reported hemesis, hematochezia, vomiting, or acute GI distress Musculoskeletal: Denies any acute onset joint swelling, redness, loss of ROM, or weakness Neurological: No reported episodes of acute onset apraxia, aphasia, dysarthria, agnosia, amnesia, paralysis, loss of coordination, or loss of consciousness  Medication Review  Cinnamon, DULoxetine , Fish Oil, HYDROcodone -acetaminophen , Multiple Vitamins-Minerals, Pentosan Polysulfate Sodium , Turmeric, acetaminophen , albuterol , ascorbic acid , aspirin  EC, atorvastatin , budesonide-glycopyrrolate -formoterol , cephALEXin , cholecalciferol , citalopram , cyanocobalamin , feeding supplement, gabapentin , hydrOXYzine , metoprolol  tartrate, nitroGLYCERIN , oxybutynin , pantoprazole , sulfaSALAzine , tiZANidine , torsemide , and valsartan  History Review  Allergy: Ms. Chiong is allergic to aspirin  and sulfa antibiotics. Drug: Ms. Schmuck  reports no history of drug use. Alcohol:  reports that she does not currently use alcohol. Tobacco:  reports that she quit smoking about 16 months ago. Her smoking use included cigarettes. She started smoking about 57 years ago. She has a 56.2 pack-year smoking history. She  has been exposed to tobacco smoke. She has never used smokeless tobacco. Social: Ms. Kuchera  reports that she quit smoking about 16 months ago. Her smoking use included cigarettes. She started smoking about 57 years ago. She has a 56.2 pack-year smoking history. She has been exposed to tobacco smoke. She has never  used smokeless tobacco. She reports that she does not currently use alcohol. She reports that she does not use drugs. Medical:  has a past medical history of Anemia, Anxiety, Aortic atherosclerosis (HCC), Arthritis, Bilateral carotid artery stenosis, CAD (coronary artery disease), native coronary artery (10/05/2022), Cerebral microvascular disease, Chronic lower back pain, Colitis, ulcerative (HCC), COPD (chronic obstructive pulmonary disease) (HCC), DDD (degenerative disc disease), cervical, Depression, Diastolic dysfunction (10/19/2022), Dyspnea, GERD (gastroesophageal reflux disease), Hiatal hernia, History of 2019 novel coronavirus disease (COVID-19) (02/2021), HLD (hyperlipidemia), Hypertension, Interstitial cystitis, Long-term use of aspirin  therapy, Lumbar disc disease, Motion sickness, On chronic clopidogrel  therapy, Paraesophageal hernia, Parkinson's disease (HCC), Pulmonary hypertension (HCC) (04/18/2021), PVD (peripheral vascular disease) (HCC), T2DM (type 2 diabetes mellitus) (HCC), TIA (transient ischemic attack) (2015), Urinary frequency, and Wears dentures. Surgical: Ms. Vaquera  has a past surgical history that includes Tonsillectomy; Cholecystectomy; Hysterectomy abdominal with salpingo-oophorectomy; Diagnostic laparoscopy; Bladder suspension; Colonoscopy; Knee arthroscopy (Left, 02/13/2014); Ankle arthroscopy (Left, 03/22/2016); Tendon repair (Left, 03/22/2016); Lumbar laminectomy/decompression microdiscectomy (N/A, 05/30/2017); Anterior cervical decomp/discectomy fusion (N/A, 04/09/2019); Lumbar laminectomy/decompression microdiscectomy (N/A, 05/27/2021); LEFT HEART CATH AND CORONARY ANGIOGRAPHY (N/A, 10/18/2022); CORONARY STENT INTERVENTION (N/A, 10/18/2022); Esophagogastroduodenoscopy (egd) with propofol  (N/A, 06/06/2023); Xi robotic assisted paraesophageal hernia repair (N/A, 06/19/2023); and Insertion of mesh (06/19/2023). Family: family history includes COPD in her father; Diabetes in her  mother; Heart disease in her father; Hypertension in her father and mother.  Laboratory Chemistry Profile   Renal Lab Results  Component Value Date   BUN 8 06/23/2023   CREATININE 0.74 06/23/2023   GFRAA >60 04/02/2019   GFRNONAA >60 06/23/2023    Hepatic Lab Results  Component Value Date   AST 61 (H) 06/22/2023   ALT 40 06/22/2023   ALBUMIN  3.1 (L) 06/22/2023   ALKPHOS 36 (L) 06/22/2023    Electrolytes Lab Results  Component Value Date   NA 134 (L) 06/23/2023   K 3.8 06/23/2023   CL 99 06/23/2023   CALCIUM  9.0 06/23/2023   MG 1.9 06/23/2023    Bone No results found for: VD25OH, VD125OH2TOT, CI6874NY7, CI7874NY7, 25OHVITD1, 25OHVITD2, 25OHVITD3, TESTOFREE, TESTOSTERONE  Inflammation (CRP: Acute Phase) (ESR: Chronic Phase) No results found for: CRP, ESRSEDRATE, LATICACIDVEN       Note: Above Lab results reviewed.  Recent Imaging Review  DG PAIN CLINIC C-ARM 1-60 MIN NO REPORT Fluoro was used, but no Radiologist interpretation will be provided.  Please refer to NOTES tab for provider progress note. Note: Reviewed        Physical Exam  Vitals: BP (!) 148/85   Pulse (!) 54   Temp 97.8 F (36.6 C)   Ht 5' 5 (1.651 m)   Wt 200 lb (90.7 kg)   SpO2 94%   BMI 33.28 kg/m  BMI: Estimated body mass index is 33.28 kg/m as calculated from the following:   Height as of this encounter: 5' 5 (1.651 m).   Weight as of this encounter: 200 lb (90.7 kg). Ideal: Ideal body weight: 57 kg (125 lb 10.6 oz) Adjusted ideal body weight: 70.5 kg (155 lb 6.4 oz) General appearance: Well nourished, well developed, and well hydrated. In no apparent acute distress Mental status: Alert, oriented x  3 (person, place, & time)       Respiratory: No evidence of acute respiratory distress Eyes: PERLA  SCS trial leads removed under live fluoroscopy, tips intact Assessment   Diagnosis Status  1. Failed back surgical syndrome   2. Radicular pain of thoracic region    3. Chronic pain syndrome   4. DDD (degenerative disc disease), cervical   5. Thoracic disc herniation   6. Inflammatory spondylopathy of cervical region (HCC)   7. Chronic radicular cervical pain    Controlled Controlled Controlled   Updated Problems: No problems updated.  Plan of Care  Problem-specific:  Assessment and Plan Assessment & Plan    Ms. Fatou D Rueda has a current medication list which includes the following long-term medication(s): albuterol , atorvastatin , duloxetine , metoprolol  tartrate, nitroglycerin , pantoprazole , sulfasalazine , torsemide , valsartan, gabapentin , and tizanidine .  Pharmacotherapy (Medications Ordered): Meds ordered this encounter  Medications   HYDROcodone -acetaminophen  (NORCO) 7.5-325 MG tablet    Sig: Take 1 tablet by mouth every 6 (six) hours as needed for moderate pain (pain score 4-6).    Dispense:  90 tablet    Refill:  0   HYDROcodone -acetaminophen  (NORCO) 7.5-325 MG tablet    Sig: Take 1 tablet by mouth every 6 (six) hours as needed for moderate pain (pain score 4-6).    Dispense:  90 tablet    Refill:  0   HYDROcodone -acetaminophen  (NORCO) 7.5-325 MG tablet    Sig: Take 1 tablet by mouth every 6 (six) hours as needed for moderate pain (pain score 4-6).    Dispense:  90 tablet    Refill:  0   gabapentin  (NEURONTIN ) 300 MG capsule    Sig: Take 3 capsules (900 mg total) by mouth 2 (two) times daily.    Dispense:  180 capsule    Refill:  5   tiZANidine  (ZANAFLEX ) 4 MG tablet    Sig: Take 1 tablet (4 mg total) by mouth every 12 (twelve) hours as needed.    Dispense:  60 tablet    Refill:  5    Fill one day early if pharmacy is closed on scheduled refill date. Generic permitted. Do not send renewal requests. Void any older duplicate prescription or refill(s) that may be on file.   Patient will let me know if she would like to move forward with permanent spinal cord stimulator implant if she does I will refer her to Dr. Katrina for  that.  Otherwise follow-up in 3 months for medication management with nurse practitioner [facility].  Return in about 3 months (around 05/15/2024) for Emmy Blanch, MM.    Recent Visits Date Type Provider Dept  02/06/24 Procedure visit Marcelino Nurse, MD [facility]  01/15/24 Office Visit Marcelino Nurse, MD [facility]  Showing recent visits within past 90 days and meeting all other requirements Today's Visits Date Type Provider Dept  02/13/24 Procedure visit Marcelino Nurse, MD [facility]  Showing today's visits and meeting all other requirements Future Appointments Date Type Provider Dept  05/12/24 Appointment Patel, Seema K, NP [facility]  Showing future appointments within next 90 days and meeting all other requirements  I discussed the assessment and treatment plan with the patient. The patient was provided an opportunity to ask questions and all were answered. The patient agreed with the plan and demonstrated an understanding of the instructions.  Patient advised to call back or seek an in-person evaluation if the symptoms or condition worsens.  Duration of encounter: 30 minutes.  Total  time on encounter, as per AMA guidelines included both the face-to-face and non-face-to-face time personally spent by the physician and/or other qualified health care professional(s) on the day of the encounter (includes time in activities that require the physician or other qualified health care professional and does not include time in activities normally performed by clinical staff). Physician's time may include the following activities when performed: Preparing to see the patient (e.g., pre-charting review of records, searching for previously ordered imaging, lab work, and nerve conduction tests) Review of prior analgesic pharmacotherapies. Reviewing PMP Interpreting ordered tests (e.g., lab work, imaging, nerve conduction tests) Performing post-procedure  evaluations, including interpretation of diagnostic procedures Obtaining and/or reviewing separately obtained history Performing a medically appropriate examination and/or evaluation Counseling and educating the patient/family/caregiver Ordering medications, tests, or procedures Referring and communicating with other health care professionals (when not separately reported) Documenting clinical information in the electronic or other health record Independently interpreting results (not separately reported) and communicating results to the patient/ family/caregiver Care coordination (not separately reported)  Note by: Wallie Sherry, MD (TTS and AI technology used. I apologize for any typographical errors that were not detected and corrected.) Date: 02/13/2024; Time: 8:35 AM

## 2024-02-13 NOTE — Telephone Encounter (Signed)
 Getting a Prolio injection tomorrow for osteoporosis.  Wants to know if you thing that is a good thing to do. I told her to cnsult the provider who ordered, she still wants your opinion

## 2024-02-13 NOTE — Telephone Encounter (Signed)
 PT has a question about a shot that she going to get done on tomorrow with another provider. PT wants to know if Lateef thinks it's okay. The shot is to increase bone structure. PT stated she had forgot to ask him. Please give her a call. TY

## 2024-02-14 NOTE — Telephone Encounter (Signed)
Pt informed

## 2024-03-04 ENCOUNTER — Other Ambulatory Visit: Payer: Self-pay | Admitting: Cardiology

## 2024-03-04 ENCOUNTER — Other Ambulatory Visit: Payer: Self-pay | Admitting: Internal Medicine

## 2024-03-04 DIAGNOSIS — R1084 Generalized abdominal pain: Secondary | ICD-10-CM

## 2024-03-04 DIAGNOSIS — K2100 Gastro-esophageal reflux disease with esophagitis, without bleeding: Secondary | ICD-10-CM

## 2024-03-07 ENCOUNTER — Ambulatory Visit
Admission: RE | Admit: 2024-03-07 | Discharge: 2024-03-07 | Disposition: A | Discharge status: Home / Self Care | Source: Ambulatory Visit | Attending: Internal Medicine | Admitting: Internal Medicine

## 2024-03-07 DIAGNOSIS — R1084 Generalized abdominal pain: Secondary | ICD-10-CM | POA: Insufficient documentation

## 2024-03-07 DIAGNOSIS — K2100 Gastro-esophageal reflux disease with esophagitis, without bleeding: Secondary | ICD-10-CM | POA: Diagnosis present

## 2024-03-07 MED ORDER — IOHEXOL 300 MG/ML  SOLN
100.0000 mL | Freq: Once | INTRAMUSCULAR | Status: AC | PRN
  Administered 2024-03-07: 100 mL via INTRAVENOUS

## 2024-03-20 ENCOUNTER — Telehealth: Payer: Self-pay

## 2024-03-20 MED ORDER — HYDROCODONE-ACETAMINOPHEN 7.5-325 MG PO TABS: 1.0000 | ORAL_TABLET | Freq: Four times a day (QID) | ORAL | 0 refills | Status: AC | PRN

## 2024-03-20 NOTE — Telephone Encounter (Signed)
 Spoke to patient and pharmacy, sent separate note to Dr. Marcelino.

## 2024-03-20 NOTE — Telephone Encounter (Signed)
 Telephone call to [facility] pharmacy, Spoke to Pharmacist Joe, He states that the script for this patients Hydrocodone  WAS NOT received. The script for July and September was received. Patient picked up the prescription on 02/13/24, but is almost out of medication now. She is asking for a new script to be sent in, so that she can pick it up. Please advise.    Disp Refills Start End   HYDROcodone -acetaminophen  (NORCO) 7.5-325 MG tablet 90 tablet 0 03/14/2024 04/13/2024   Sig - Route: Take 1 tablet by mouth every 6 (six) hours as needed for moderate pain (pain score 4-6). - Oral   Sent to pharmacy as: HYDROcodone -acetaminophen  (NORCO) 7.5-325 MG tablet   Earliest Fill Date: 03/14/2024   E-Prescribing Status: Receipt confirmed by pharmacy (02/13/2024  8:29 AM EDT)

## 2024-03-20 NOTE — Telephone Encounter (Signed)
 Her pharmacy is telling her she doesn't have a hydrocodone  script for August. She has one for September. She is asking for us  to call in the August script.

## 2024-03-20 NOTE — Addendum Note (Signed)
 Addended by: MARCELINO NURSE on: 03/20/2024 04:10 PM   Modules accepted: Orders

## 2024-04-11 ENCOUNTER — Other Ambulatory Visit: Payer: Self-pay

## 2024-04-11 ENCOUNTER — Ambulatory Visit
Admission: RE | Admit: 2024-04-11 | Discharge: 2024-04-11 | Disposition: A | Discharge status: Home / Self Care | Attending: Gastroenterology | Admitting: Gastroenterology

## 2024-04-11 ENCOUNTER — Ambulatory Visit: Admitting: Registered Nurse

## 2024-04-11 ENCOUNTER — Encounter
Admission: RE | Disposition: A | Discharge status: Home / Self Care | Payer: Self-pay | Source: Home / Self Care | Attending: Gastroenterology

## 2024-04-11 ENCOUNTER — Encounter: Payer: Self-pay | Admitting: Gastroenterology

## 2024-04-11 DIAGNOSIS — G709 Myoneural disorder, unspecified: Secondary | ICD-10-CM | POA: Diagnosis not present

## 2024-04-11 DIAGNOSIS — Z8673 Personal history of transient ischemic attack (TIA), and cerebral infarction without residual deficits: Secondary | ICD-10-CM | POA: Insufficient documentation

## 2024-04-11 DIAGNOSIS — K2101 Gastro-esophageal reflux disease with esophagitis, with bleeding: Secondary | ICD-10-CM | POA: Diagnosis not present

## 2024-04-11 DIAGNOSIS — Z87891 Personal history of nicotine dependence: Secondary | ICD-10-CM | POA: Diagnosis not present

## 2024-04-11 DIAGNOSIS — Z8711 Personal history of peptic ulcer disease: Secondary | ICD-10-CM | POA: Insufficient documentation

## 2024-04-11 DIAGNOSIS — K449 Diaphragmatic hernia without obstruction or gangrene: Secondary | ICD-10-CM | POA: Insufficient documentation

## 2024-04-11 DIAGNOSIS — I2510 Atherosclerotic heart disease of native coronary artery without angina pectoris: Secondary | ICD-10-CM | POA: Insufficient documentation

## 2024-04-11 DIAGNOSIS — E1151 Type 2 diabetes mellitus with diabetic peripheral angiopathy without gangrene: Secondary | ICD-10-CM | POA: Diagnosis not present

## 2024-04-11 DIAGNOSIS — K319 Disease of stomach and duodenum, unspecified: Secondary | ICD-10-CM | POA: Insufficient documentation

## 2024-04-11 DIAGNOSIS — R1314 Dysphagia, pharyngoesophageal phase: Secondary | ICD-10-CM | POA: Diagnosis present

## 2024-04-11 DIAGNOSIS — K2971 Gastritis, unspecified, with bleeding: Secondary | ICD-10-CM | POA: Diagnosis not present

## 2024-04-11 DIAGNOSIS — J449 Chronic obstructive pulmonary disease, unspecified: Secondary | ICD-10-CM | POA: Diagnosis not present

## 2024-04-11 DIAGNOSIS — N289 Disorder of kidney and ureter, unspecified: Secondary | ICD-10-CM | POA: Diagnosis not present

## 2024-04-11 DIAGNOSIS — K259 Gastric ulcer, unspecified as acute or chronic, without hemorrhage or perforation: Secondary | ICD-10-CM | POA: Diagnosis not present

## 2024-04-11 DIAGNOSIS — Z79899 Other long term (current) drug therapy: Secondary | ICD-10-CM | POA: Diagnosis not present

## 2024-04-11 DIAGNOSIS — I110 Hypertensive heart disease with heart failure: Secondary | ICD-10-CM | POA: Insufficient documentation

## 2024-04-11 DIAGNOSIS — I509 Heart failure, unspecified: Secondary | ICD-10-CM | POA: Diagnosis not present

## 2024-04-11 HISTORY — PX: ESOPHAGOGASTRODUODENOSCOPY: SHX5428

## 2024-04-11 LAB — KOH PREP: Special Requests: NORMAL

## 2024-04-11 LAB — GLUCOSE, CAPILLARY: Glucose-Capillary: 102 mg/dL — ABNORMAL HIGH (ref 70–99)

## 2024-04-11 SURGERY — EGD (ESOPHAGOGASTRODUODENOSCOPY)
Anesthesia: General

## 2024-04-11 MED ORDER — PROPOFOL 10 MG/ML IV BOLUS
INTRAVENOUS | Status: DC | PRN
  Administered 2024-04-11: 100 mg via INTRAVENOUS

## 2024-04-11 MED ORDER — OMEPRAZOLE 40 MG PO CPDR: 40.0000 mg | DELAYED_RELEASE_CAPSULE | Freq: Two times a day (BID) | ORAL | 5 refills | Status: DC

## 2024-04-11 MED ORDER — PROPOFOL 500 MG/50ML IV EMUL
INTRAVENOUS | Status: DC | PRN
  Administered 2024-04-11: 150 ug/kg/min via INTRAVENOUS

## 2024-04-11 MED ORDER — DEXMEDETOMIDINE HCL IN NACL 80 MCG/20ML IV SOLN
INTRAVENOUS | Status: DC | PRN
  Administered 2024-04-11: 8 ug via INTRAVENOUS

## 2024-04-11 MED ORDER — SODIUM CHLORIDE 0.9 % IV SOLN: INTRAVENOUS | Status: DC

## 2024-04-11 NOTE — Anesthesia Preprocedure Evaluation (Signed)
 Anesthesia Evaluation  Patient identified by MRN, date of birth, ID band Patient awake    Reviewed: Allergy & Precautions, NPO status , Patient's Chart, lab work & pertinent test results  History of Anesthesia Complications Negative for: history of anesthetic complications  Airway Mallampati: III  TM Distance: <3 FB Neck ROM: full    Dental  (+) Chipped   Pulmonary shortness of breath and with exertion, COPD, Recent URI , Residual Cough, former smoker    + decreased breath sounds      Cardiovascular hypertension, (-) angina + CAD, + Peripheral Vascular Disease and +CHF  Normal cardiovascular exam     Neuro/Psych  Headaches PSYCHIATRIC DISORDERS      TIA Neuromuscular disease    GI/Hepatic Neg liver ROS, PUD,GERD  Controlled,,  Endo/Other  negative endocrine ROSdiabetes, Type 2    Renal/GU Renal disease  negative genitourinary   Musculoskeletal   Abdominal   Peds  Hematology negative hematology ROS (+)   Anesthesia Other Findings Patient reports that they do not think that any food or pills are stuck in their throat at this time.  Hoarse voice at baseline PreOp  Past Medical History: No date: Anemia No date: Anxiety No date: Aortic atherosclerosis (HCC) No date: Arthritis No date: Bilateral carotid artery stenosis 10/05/2022: CAD (coronary artery disease), native coronary artery     Comment:  a.) cCTA 10/05/2022: Ca2+ = 601 (92nd %ile); LAD 383,               RCA 217; b.) LHC 10/18/2022: 85% pRCA ( 3.5 x 15 mm Onyx               [place] DES) No date: Cerebral microvascular disease No date: Chronic lower back pain No date: Colitis, ulcerative (HCC) No date: COPD (chronic obstructive pulmonary disease) (HCC) No date: DDD (degenerative disc disease), cervical     Comment:  a.) s/p ACDF C3-C4 04/09/2019 No date: Depression 10/19/2022: Diastolic dysfunction     Comment:  a.) TTE 10/19/2022: EF 60-65%, no  RWMAs,  G1DD, normal               RVSF; b.) TTE 12/04/2022: EF >55%. no RWMAs, G1DD, mild               LAE, triv AR/MR/PR, mod TR, PASP 45 No date: Dyspnea No date: GERD (gastroesophageal reflux disease) No date: Hiatal hernia 02/2021: History of 2019 novel coronavirus disease (COVID-19) No date: HLD (hyperlipidemia) No date: Hypertension No date: Interstitial cystitis No date: Long-term use of aspirin  therapy No date: Lumbar disc disease     Comment:  a.) s/p L3-L5 laminectomy, decompression, and               microdiscectomy 05/30/2017; b.) s/p L4-S1 decompression               05/27/2021 No date: Motion sickness     Comment:  fair rides No date: On chronic clopidogrel  therapy No date: Paraesophageal hernia No date: Parkinson's disease [facility])     Comment:  suspected per dr Maree @ [facility] neuro 04/18/2021: Pulmonary hypertension (HCC)     Comment:  a.) TTE 04/18/2021: PASP 42.9; b.) TTE 12/04/2022: PASP               45 No date: PVD (peripheral vascular disease) (HCC) No date: T2DM (type 2 diabetes mellitus) (HCC) 2015: TIA (transient ischemic attack)     Comment:  a.) no deficits; per pt her carvedilol  dose was too high  causing hypotension No date: Urinary frequency No date: Wears dentures     Comment:  partial upper and lower  Past Surgical History: 03/22/2016: ANKLE ARTHROSCOPY; Left     Comment:  Procedure: ANKLE ARTHROSCOPY DEBRODEMENT EXTENSIVE LEFT               FLEXAR TENDON REPAIR;  Surgeon: Eva Gay, DPM;                Location: [facility];  Service: Podiatry;                Laterality: Left;  WITH POPLITEAL 04/09/2019: ANTERIOR CERVICAL DECOMP/DISCECTOMY FUSION; N/A     Comment:  Procedure: ANTERIOR CERVICAL DECOMPRESSION/DISCECTOMY               FUSION 1 LEVEL C3-4;  Surgeon: Clois Fret, MD;                Location: [facility] ORS;  Service: Neurosurgery;  Laterality:               N/A; No date: BLADDER SUSPENSION No date:  CHOLECYSTECTOMY No date: COLONOSCOPY 10/18/2022: CORONARY STENT INTERVENTION; N/A     Comment:  Procedure: CORONARY STENT INTERVENTION;  Surgeon: Darron Deatrice LABOR, MD;  Location: [facility] INVASIVE CV LAB;                Service: Cardiovascular;  Laterality: N/A; No date: DIAGNOSTIC LAPAROSCOPY     Comment:  adhesions 06/06/2023: ESOPHAGOGASTRODUODENOSCOPY (EGD) WITH PROPOFOL ; N/A     Comment:  Procedure: ESOPHAGOGASTRODUODENOSCOPY (EGD) WITH               PROPOFOL ;  Surgeon: Tye Millet, DO;  Location: [facility]               ENDOSCOPY;  Service: General;  Laterality: N/A;  patient               to stay on plavix  and aspirin  due to recent stent               placement, Dr. Tye aware No date: HYSTERECTOMY ABDOMINAL WITH SALPINGO-OOPHORECTOMY 06/19/2023: INSERTION OF MESH     Comment:  Procedure: INSERTION OF MESH;  Surgeon: Jordis Laneta FALCON,               MD;  Location: [facility] ORS;  Service: General;;                paraesophageal hernia repair 02/13/2014: KNEE ARTHROSCOPY; Left     Comment:  Procedure: LEFT ARTHROSCOPY KNEE, PARTIAL MEDIAL               MENISECTOMY AND PLICA, CHONDROPLASTY OF PATELLA FEMORAL               JOINT;  Surgeon: Norleen LITTIE Gavel, MD;  Location: [place]              [facility];  Service: Orthopedics;  Laterality: Left; 10/18/2022: LEFT HEART CATH AND CORONARY ANGIOGRAPHY; N/A     Comment:  Procedure: LEFT HEART CATH AND CORONARY ANGIOGRAPHY;                Surgeon: Darron Deatrice LABOR, MD;  Location: [facility] INVASIVE               CV LAB;  Service: Cardiovascular;  Laterality: N/A; 05/30/2017: LUMBAR LAMINECTOMY/DECOMPRESSION MICRODISCECTOMY; N/A     Comment:  Procedure: LUMBAR LAMINECTOMY/DECOMPRESSION  MICRODISCECTOMY 3 LEVELS-L3-S1;  Surgeon: Clois Fret, MD;  Location: [facility] ORS;  Service: Neurosurgery;              Laterality: N/A; 05/27/2021: LUMBAR LAMINECTOMY/DECOMPRESSION MICRODISCECTOMY; N/A     Comment:   Procedure: L4-S1 DECOMPRESSION;  Surgeon: Clois Fret, MD;  Location: [facility] OR[facility];  Service: Neurosurgery;              Laterality: N/A; 03/22/2016: TENDON REPAIR; Left     Comment:  Procedure: QOZKJM TENDON REPAIR;  Surgeon: Eva Gay, DPM;  Location: [facility];  Service:               Podiatry;  Laterality: Left; No date: TONSILLECTOMY 06/19/2023: XI ROBOTIC ASSISTED PARAESOPHAGEAL HERNIA REPAIR; N/A     Comment:  Procedure: XI ROBOTIC ASSISTED PARAESOPHAGEAL HERNIA               REPAIR, RNFA to assist;  Surgeon: Jordis Laneta FALCON, MD;                Location: [facility] OR[facility];  Service: General;  Laterality: N/A;     Reproductive/Obstetrics negative OB ROS                              Anesthesia Physical Anesthesia Plan  ASA: 3  Anesthesia Plan: General   Post-op Pain Management:    Induction: Intravenous  PONV Risk Score and Plan: Propofol  infusion and TIVA  Airway Management Planned: Natural Airway and Nasal Cannula  Additional Equipment:   Intra-op Plan:   Post-operative Plan:   Informed Consent: I have reviewed the patients History and Physical, chart, labs and discussed the procedure including the risks, benefits and alternatives for the proposed anesthesia with the patient or authorized representative who has indicated his/her understanding and acceptance.     Dental Advisory Given  Plan Discussed with: Anesthesiologist, CRNA and Surgeon  Anesthesia Plan Comments: (Patient consented for risks of anesthesia including but not limited to:  - adverse reactions to medications - risk of airway placement if required - damage to eyes, teeth, lips or other oral mucosa - nerve damage due to positioning  - sore throat or hoarseness - Damage to heart, brain, nerves, lungs, other parts of body or loss of life  Patient voiced understanding and assent.)        Anesthesia Quick Evaluation

## 2024-04-11 NOTE — Anesthesia Procedure Notes (Signed)
 Date/Time: 04/11/2024 11:55 AM  Performed by: Tod Handing, CRNAPre-anesthesia Checklist: Patient identified, Emergency Drugs available, Suction available and Patient being monitored Patient Re-evaluated:Patient Re-evaluated prior to induction Oxygen Delivery Method: Nasal cannula Induction Type: IV induction Dental Injury: Teeth and Oropharynx as per pre-operative assessment  Comments: Nasal cannula with etCO2 monitoring

## 2024-04-11 NOTE — H&P (Signed)
 Tammy JONELLE Brooklyn, MD [facility], DHIP [address]  [place], [place] [address]  Main: [phone] Fax:  [phone] Pager: [phone]   Primary Care Physician:  Sherial Bail, MD Primary Gastroenterologist:  Dr. Corinn JONELLE Lamb  Pre-Procedure History & Physical: HPI:  Tammy Lamb is a 73 y.o. female is here for an endoscopy.   Past Medical History:  Diagnosis Date   Anemia    Anxiety    Aortic atherosclerosis (HCC)    Arthritis    Bilateral carotid artery stenosis    CAD (coronary artery disease), native coronary artery 10/05/2022   a.) cCTA 10/05/2022: Ca2+ = 601 (92nd %ile); LAD 383, RCA 217; b.) LHC 10/18/2022: 85% pRCA ( 3.5 x 15 mm Onyx Frontier DES)   Cerebral microvascular disease    Chronic lower back pain    Colitis, ulcerative (HCC)    COPD (chronic obstructive pulmonary disease) (HCC)    DDD (degenerative disc disease), cervical    a.) s/p ACDF C3-C4 04/09/2019   Depression    Diastolic dysfunction 10/19/2022   a.) TTE 10/19/2022: EF 60-65%, no RWMAs,  G1DD, normal RVSF; b.) TTE 12/04/2022: EF >55%. no RWMAs, G1DD, mild LAE, triv AR/MR/PR, mod TR, PASP 45   Dyspnea    GERD (gastroesophageal reflux disease)    Hiatal hernia    History of 2019 novel coronavirus disease (COVID-19) 02/2021   HLD (hyperlipidemia)    Hypertension    Interstitial cystitis    Long-term use of aspirin  therapy    Lumbar disc disease    a.) s/p L3-L5 laminectomy, decompression, and microdiscectomy 05/30/2017; b.) s/p L4-S1 decompression 05/27/2021   Motion sickness    fair rides   On chronic clopidogrel  therapy    Paraesophageal hernia    Parkinson's disease (HCC)    suspected per dr Maree @ [facility] neuro   Pulmonary hypertension (HCC) 04/18/2021   a.) TTE 04/18/2021: PASP 42.9; b.) TTE 12/04/2022: PASP 45   PVD (peripheral vascular disease) (HCC)    T2DM (type 2 diabetes mellitus) (HCC)    TIA (transient ischemic attack) 2015   a.) no deficits;  per pt her carvedilol  dose was too high causing hypotension   Urinary frequency    Wears dentures    partial upper and lower    Past Surgical History:  Procedure Laterality Date   ANKLE ARTHROSCOPY Left 03/22/2016   Procedure: ANKLE ARTHROSCOPY DEBRODEMENT EXTENSIVE LEFT FLEXAR TENDON REPAIR;  Surgeon: Eva Gay, DPM;  Location: [facility];  Service: Podiatry;  Laterality: Left;  WITH POPLITEAL   ANTERIOR CERVICAL DECOMP/DISCECTOMY FUSION N/A 04/09/2019   Procedure: ANTERIOR CERVICAL DECOMPRESSION/DISCECTOMY FUSION 1 LEVEL C3-4;  Surgeon: Clois Fret, MD;  Location: [facility];  Service: Neurosurgery;  Laterality: N/A;   BLADDER SUSPENSION     CHOLECYSTECTOMY     COLONOSCOPY     CORONARY STENT INTERVENTION N/A 10/18/2022   Procedure: CORONARY STENT INTERVENTION;  Surgeon: Darron Deatrice LABOR, MD;  Location: [facility];  Service: Cardiovascular;  Laterality: N/A;   DIAGNOSTIC LAPAROSCOPY     adhesions   ESOPHAGOGASTRODUODENOSCOPY (EGD) WITH PROPOFOL  N/A 06/06/2023   Procedure: ESOPHAGOGASTRODUODENOSCOPY (EGD) WITH PROPOFOL ;  Surgeon: Tye Millet, DO;  Location: [facility];  Service: General;  Laterality: N/A;  patient to stay on plavix  and aspirin  due to recent stent placement, Dr. Tye aware   HYSTERECTOMY ABDOMINAL WITH SALPINGO-OOPHORECTOMY     INSERTION OF MESH  06/19/2023   Procedure: INSERTION OF MESH;  Surgeon: Jordis Laneta FALCON, MD;  Location: [facility];  Service: General;;  paraesophageal hernia repair   KNEE ARTHROSCOPY Left 02/13/2014   Procedure: LEFT ARTHROSCOPY KNEE, PARTIAL MEDIAL MENISECTOMY AND PLICA, CHONDROPLASTY OF PATELLA FEMORAL JOINT;  Surgeon: Norleen LITTIE Gavel, MD;  Location: [facility];  Service: Orthopedics;  Laterality: Left;   LEFT HEART CATH AND CORONARY ANGIOGRAPHY N/A 10/18/2022   Procedure: LEFT HEART CATH AND CORONARY ANGIOGRAPHY;  Surgeon: Darron Deatrice LABOR, MD;  Location: [facility];  Service:  Cardiovascular;  Laterality: N/A;   LUMBAR LAMINECTOMY/DECOMPRESSION MICRODISCECTOMY N/A 05/30/2017   Procedure: LUMBAR LAMINECTOMY/DECOMPRESSION MICRODISCECTOMY 3 LEVELS-L3-S1;  Surgeon: Clois Fret, MD;  Location: [facility];  Service: Neurosurgery;  Laterality: N/A;   LUMBAR LAMINECTOMY/DECOMPRESSION MICRODISCECTOMY N/A 05/27/2021   Procedure: L4-S1 DECOMPRESSION;  Surgeon: Clois Fret, MD;  Location: [facility];  Service: Neurosurgery;  Laterality: N/A;   TENDON REPAIR Left 03/22/2016   Procedure: QOZKJM TENDON REPAIR;  Surgeon: Eva Gay, DPM;  Location: [facility];  Service: Podiatry;  Laterality: Left;   TONSILLECTOMY     XI ROBOTIC ASSISTED PARAESOPHAGEAL HERNIA REPAIR N/A 06/19/2023   Procedure: XI ROBOTIC ASSISTED PARAESOPHAGEAL HERNIA REPAIR, RNFA to assist;  Surgeon: Jordis Laneta FALCON, MD;  Location: [facility];  Service: General;  Laterality: N/A;    Prior to Admission medications   Medication Sig Start Date End Date Taking? Authorizing Provider  acetaminophen  (TYLENOL ) 500 MG tablet Take 1,000 mg by mouth every 6 (six) hours as needed for moderate pain or headache.   Yes [provider]  ascorbic acid  (VITAMIN C) 1000 MG tablet Take 1,000 mg by mouth once a week.   Yes [provider]  aspirin  EC (ASPIRIN  LOW DOSE) 81 MG tablet TAKE 1 TABLET (81 MG TOTAL) BY MOUTH DAILY. SWALLOW WHOLE. 02/13/24  Yes Darliss Rogue, MD  atorvastatin  (LIPITOR ) 80 MG tablet Take 80 mg by mouth at bedtime.   Yes [provider]  budeson-glycopyrrolate -formoterol  (BREZTRI  AEROSPHERE) 160-9-4.8 MCG/ACT AERO inhaler Inhale 2 puffs into the lungs in the morning and at bedtime. 11/06/23  Yes Tamea Dedra LITTIE, MD  cephALEXin  (KEFLEX ) 250 MG capsule Take 1 capsule (250 mg total) by mouth daily. 12/24/23  Yes MacDiarmid, Glendia, MD  cholecalciferol  (VITAMIN D ) 25 MCG (1000 UNIT) tablet Take 1,000 Units by mouth at bedtime.   Yes [provider]  Cinnamon  500 MG TABS Take 500 mg by mouth in the morning and at bedtime.   Yes [provider]  citalopram  (CELEXA ) 20 MG tablet Take 20 mg by mouth every morning.   Yes [provider]  DULoxetine  (CYMBALTA ) 60 MG capsule Take 60 mg by mouth daily. 12/15/21  Yes [provider]  gabapentin  (NEURONTIN ) 300 MG capsule Take 3 capsules (900 mg total) by mouth 2 (two) times daily. 02/13/24 08/11/24 Yes Marcelino Nurse, MD  HYDROcodone -acetaminophen  (NORCO) 7.5-325 MG tablet Take 1 tablet by mouth every 6 (six) hours as needed for moderate pain (pain score 4-6). 04/13/24 05/13/24 Yes Marcelino Nurse, MD  hydrOXYzine  (ATARAX ) 25 MG tablet Take 1 tablet (25 mg total) by mouth at bedtime. 12/24/23  Yes MacDiarmid, Glendia, MD  metoprolol  tartrate (LOPRESSOR ) 25 MG tablet Take 1 tablet (25 mg total) by mouth 2 (two) times daily. 01/09/24  Yes Agbor-Etang, Rogue, MD  Multiple Vitamins-Minerals (HAIR SKIN NAILS PO) Take 1 tablet by mouth in the morning and at bedtime.   Yes [provider]  Omega-3 Fatty Acids (FISH OIL) 1200 MG CAPS Take 1,200 mg by mouth once a week.  Yes [provider]  oxybutynin  (DITROPAN -XL) 10 MG 24 hr tablet Take 1 tablet (10 mg total) by mouth at bedtime. 12/24/23  Yes MacDiarmid, Glendia, MD  pantoprazole  (PROTONIX ) 40 MG tablet TAKE 1 TABLET BY MOUTH EVERY DAY 03/05/24  Yes Agbor-Etang, Redell, MD  sulfaSALAzine  (AZULFIDINE ) 500 MG tablet Take 500 mg by mouth 2 (two) times daily.   Yes [provider]  tiZANidine  (ZANAFLEX ) 4 MG tablet Take 1 tablet (4 mg total) by mouth every 12 (twelve) hours as needed. 02/13/24 08/11/24 Yes Lateef, Bilal, MD  TURMERIC CURCUMIN PO Take 1 capsule by mouth daily.   Yes [provider]  valsartan (DIOVAN) 160 MG tablet Take 160 mg by mouth daily.   Yes [provider]  vitamin B-12 (CYANOCOBALAMIN ) 1000 MCG tablet Take 1,000 mcg by mouth daily.   Yes [provider]  albuterol  (VENTOLIN  HFA) 108 (90  Base) MCG/ACT inhaler Inhale 2 puffs into the lungs every 6 (six) hours as needed for wheezing or shortness of breath. 02/13/23   Tamea Dedra CROME, MD  feeding supplement (ENSURE ENLIVE / ENSURE PLUS) LIQD Take 237 mLs by mouth 2 (two) times daily between meals. 06/24/23   Marinda Jayson KIDD, MD  HYDROcodone -acetaminophen  (NORCO) 7.5-325 MG tablet Take 1 tablet by mouth every 6 (six) hours as needed for moderate pain (pain score 4-6). 03/20/24 04/19/24  Marcelino Nurse, MD  nitroGLYCERIN  (NITROSTAT ) 0.4 MG SL tablet Place 1 tablet (0.4 mg total) under the tongue every 5 (five) minutes as needed for chest pain. 11/01/22 02/13/24  Vivienne Lonni Ingle, NP  Pentosan Polysulfate Sodium  150 MG CPDR Take 1 capsule by mouth 2 (two) times daily. 12/24/23   Gaston Glendia, MD  torsemide  (DEMADEX ) 20 MG tablet Take 1 tablet (20 mg total) by mouth daily as needed. Patient not taking: Reported on 04/11/2024 01/10/24   Darliss Redell, MD    Allergies as of 04/07/2024 - Reviewed 03/07/2024  Allergen Reaction Noted   Aspirin  Other (See Comments) 12/15/2020   Sulfa antibiotics Nausea And Vomiting 02/10/2014    Family History  Problem Relation Age of Onset   Diabetes Mother    Hypertension Mother    Hypertension Father    COPD Father    Heart disease Father    Breast cancer Neg Hx     Social History   Socioeconomic History   Marital status: Married    Spouse name: Hornbrook,Terry L (Spouse)   Number of children: Not on file   Years of education: Not on file   Highest education level: Not on file  Occupational History   Not on file  Tobacco Use   Smoking status: Former    Current packs/day: 0.00    Average packs/day: 1 pack/day for 56.2 years (56.2 ttl pk-yrs)    Types: Cigarettes    Start date: 41    Quit date: 09/22/2022    Years since quitting: 1.5    Passive exposure: Past   Smokeless tobacco: Never   Tobacco comments:    Quit in March of 2024.  Vaping Use   Vaping status: Never Used   Substance and Sexual Activity   Alcohol use: Not Currently    Comment: Occassional,   Drug use: No   Sexual activity: Never  Other Topics Concern   Not on file  Social History Narrative   Not on file   Social Drivers of Health   Financial Resource Strain: Low Risk  (02/27/2024)   Received from [facility]  Overall Financial Resource Strain (CARDIA)    Difficulty of Paying Living Expenses: Not hard at all  Food Insecurity: No Food Insecurity (02/27/2024)   Received from [facility]   Hunger Vital Sign    Within the past 12 months, you worried that your food would run out before you got the money to buy more.: Never true    Within the past 12 months, the food you bought just didn't last and you didn't have money to get more.: Never true  Transportation Needs: No Transportation Needs (02/27/2024)   Received from [facility] - Transportation    In the past 12 months, has lack of transportation kept you from medical appointments or from getting medications?: No    Lack of Transportation (Non-Medical): No  Physical Activity: Not on file  Stress: Not on file  Social Connections: Not on file  Intimate Partner Violence: Not At Risk (06/19/2023)   Humiliation, Afraid, Rape, and Kick questionnaire    Fear of Current or Ex-Partner: No    Emotionally Abused: No    Physically Abused: No    Sexually Abused: No    Review of Systems: See HPI, otherwise negative ROS  Physical Exam: BP (!) 166/139 Comment: repeat left arm  Pulse (!) 55   Temp (!) 96.9 F (36.1 C) (Temporal)   Resp 16   Ht 5' 5 (1.651 m)   Wt 92.5 kg   SpO2 100%   BMI 33.95 kg/m  General:   Alert,  pleasant and cooperative in NAD Head:  Normocephalic and atraumatic. Neck:  Supple; no masses or thyromegaly. Lungs:  Clear throughout to auscultation.    Heart:  Regular rate and rhythm. Abdomen:  Soft, nontender and nondistended. Normal bowel sounds,  without guarding, and without rebound.   Neurologic:  Alert and  oriented x4;  grossly normal neurologically.  Impression/Plan: Tammy Lamb is here for an endoscopy to be performed for dysphagia  Risks, benefits, limitations, and alternatives regarding upper endoscopy have been reviewed with the patient.  Questions have been answered.  All parties agreeable.   Tammy Brooklyn, MD  04/11/2024, 11:24 AM

## 2024-04-11 NOTE — Transfer of Care (Signed)
 Immediate Anesthesia Transfer of Care Note  Patient: Tammy Lamb  Procedure(s) Performed: Procedure(s): EGD (ESOPHAGOGASTRODUODENOSCOPY) (N/A)  Patient Location: PACU and Endoscopy Unit  Anesthesia Type:General  Level of Consciousness: sedated  Airway & Oxygen Therapy: Patient Spontanous Breathing and Patient connected to nasal cannula oxygen  Post-op Assessment: Report given to RN and Post -op Vital signs reviewed and stable  Post vital signs: Reviewed and stable  Last Vitals:  Vitals:   04/11/24 1213 04/11/24 1214  BP: (!) 140/97 (!) 140/97  Pulse: (!) 53 (!) 57  Resp: 15 14  Temp:    SpO2: 93% 95%    Complications: No apparent anesthesia complications

## 2024-04-11 NOTE — Anesthesia Postprocedure Evaluation (Signed)
 Anesthesia Post Note  Patient: Tammy Lamb  Procedure(s) Performed: EGD (ESOPHAGOGASTRODUODENOSCOPY)  Patient location during evaluation: Endoscopy Anesthesia Type: General Level of consciousness: awake and alert Pain management: pain level controlled Vital Signs Assessment: post-procedure vital signs reviewed and stable Respiratory status: spontaneous breathing, nonlabored ventilation and respiratory function stable Cardiovascular status: blood pressure returned to baseline and stable Postop Assessment: no apparent nausea or vomiting Anesthetic complications: no   No notable events documented.   Last Vitals:  Vitals:   04/11/24 1214 04/11/24 1236  BP: (!) 140/97 (!) 171/89  Pulse: (!) 57 (!) 50  Resp: 14 14  Temp:    SpO2: 95% 98%    Last Pain:  Vitals:   04/11/24 1213  TempSrc:   PainSc: 0-No pain                 Fairy POUR Tekeya Geffert

## 2024-04-11 NOTE — Op Note (Signed)
 [facility] Patient Name: Tammy Lamb Procedure Date: 04/11/2024 11:46 AM MRN: [id number] Account #: [id number] Date of Birth: Mar 07, 1951 Admit Type: Outpatient Age: 73 Room: [facility] ENDO ROOM 4 Gender: Female Note Status: Finalized Instrument Name: Upper GI Scope [id number] Procedure:             Upper GI endoscopy Indications:           Esophageal dysphagia Providers:             Corinn Jess Brooklyn MD, MD Medicines:             General Anesthesia Complications:         No immediate complications. Estimated blood loss: None. Procedure:             Pre-Anesthesia Assessment:                        - Prior to the procedure, a History and Physical was                         performed, and patient medications and allergies were                         reviewed. The patient is competent. The risks and                         benefits of the procedure and the sedation options and                         risks were discussed with the patient. All questions                         were answered and informed consent was obtained.                         Patient identification and proposed procedure were                         verified by the physician, the nurse, the                         anesthesiologist, the anesthetist and the technician                         in the pre-procedure area in the procedure room in the                         endoscopy suite. Mental Status Examination: alert and                         oriented. Airway Examination: normal oropharyngeal                         airway and neck mobility. Respiratory Examination:                         clear to auscultation. CV Examination: normal.                         Prophylactic Antibiotics:  The patient does not require                         prophylactic antibiotics. Prior Anticoagulants: The                         patient has taken no anticoagulant or antiplatelet                          agents. ASA Grade Assessment: III - A patient with                         severe systemic disease. After reviewing the risks and                         benefits, the patient was deemed in satisfactory                         condition to undergo the procedure. The anesthesia                         plan was to use general anesthesia. Immediately prior                         to administration of medications, the patient was                         re-assessed for adequacy to receive sedatives. The                         heart rate, respiratory rate, oxygen saturations,                         blood pressure, adequacy of pulmonary ventilation, and                         response to care were monitored throughout the                         procedure. The physical status of the patient was                         re-assessed after the procedure.                        After obtaining informed consent, the endoscope was                         passed under direct vision. Throughout the procedure,                         the patient's blood pressure, pulse, and oxygen                         saturations were monitored continuously. The Endoscope                         was introduced through the mouth, and advanced to the  second part of duodenum. The upper GI endoscopy was                         accomplished without difficulty. The patient tolerated                         the procedure well. Findings:      The duodenal bulb and second portion of the duodenum were normal.      Diffuse moderate inflammation with hemorrhage characterized by erosions       and erythema was found in the entire examined stomach. Biopsies were       taken with a cold forceps for histology.      A medium-sized hiatal hernia with multiple Cameron erosions was found.      LA Grade D (one or more mucosal breaks involving at least 75% of       esophageal circumference) esophagitis with  bleeding was found in the       distal esophagus and gastroesophageal junction.      Patchy, white plaques were found in the middle third of the esophagus       and in the lower third of the esophagus. Cells for cytology were       obtained by brushing.      Evidence of a Nissen fundoplication was found in the cardia. The wrap       appeared loose. This was traversed. Impression:            - Normal duodenal bulb and second portion of the                         duodenum.                        - Gastritis with hemorrhage. Biopsied.                        - Medium-sized hiatal hernia with multiple Cameron                         ulcers.                        - LA Grade D reflux esophagitis with bleeding.                        - Esophageal plaques were found, suspicious for                         candidiasis. Cells for cytology obtained.                        - A Nissen fundoplication was found. The wrap appears                         loose. Recommendation:        - Discharge patient to home (with escort).                        - Resume previous diet today.                        -  Continue present medications.                        - Follow an antireflux regimen for the rest of the                         patient's life.                        - Use Prilosec (omeprazole ) 40 mg PO BID for 3 months,                         then daily.                        - Repeat upper endoscopy in 8 weeks to check healing.                        - Return to my office as previously scheduled. Procedure Code(s):     --- Professional ---                        [phone], Esophagogastroduodenoscopy, flexible,                         transoral; with biopsy, single or multiple Diagnosis Code(s):     --- Professional ---                        K29.71, Gastritis, unspecified, with bleeding                        K44.9, Diaphragmatic hernia without obstruction or                         gangrene                         K25.9, Gastric ulcer, unspecified as acute or chronic,                         without hemorrhage or perforation                        K21.01, Gastro-esophageal reflux disease with                         esophagitis, with bleeding                        K22.9, Disease of esophagus, unspecified                        R13.14, Dysphagia, pharyngoesophageal phase CPT copyright 2022 American [employer] Association. All rights reserved. The codes documented in this report are preliminary and upon coder review may  be revised to meet current compliance requirements. Dr. Corinn Brooklyn Corinn Jess Brooklyn MD, MD 04/11/2024 12:15:33 PM This report has been signed electronically. Number of Addenda: 0 Note Initiated On: 04/11/2024 11:46 AM Estimated Blood Loss:  Estimated blood loss: none.      [facility]

## 2024-04-14 ENCOUNTER — Ambulatory Visit: Payer: Self-pay | Admitting: Gastroenterology

## 2024-04-14 ENCOUNTER — Encounter: Payer: Self-pay | Admitting: Pulmonary Disease

## 2024-04-14 ENCOUNTER — Encounter: Payer: Self-pay | Admitting: Acute Care

## 2024-04-14 ENCOUNTER — Ambulatory Visit: Admitting: Pulmonary Disease

## 2024-04-14 VITALS — BP 142/90 | HR 58 | Temp 97.6°F | Ht 65.0 in | Wt 203.0 lb

## 2024-04-14 DIAGNOSIS — J449 Chronic obstructive pulmonary disease, unspecified: Secondary | ICD-10-CM

## 2024-04-14 DIAGNOSIS — Z9889 Other specified postprocedural states: Secondary | ICD-10-CM | POA: Diagnosis not present

## 2024-04-14 DIAGNOSIS — B3781 Candidal esophagitis: Secondary | ICD-10-CM

## 2024-04-14 DIAGNOSIS — Z23 Encounter for immunization: Secondary | ICD-10-CM | POA: Diagnosis not present

## 2024-04-14 DIAGNOSIS — K449 Diaphragmatic hernia without obstruction or gangrene: Secondary | ICD-10-CM | POA: Diagnosis not present

## 2024-04-14 DIAGNOSIS — K219 Gastro-esophageal reflux disease without esophagitis: Secondary | ICD-10-CM

## 2024-04-14 LAB — SURGICAL PATHOLOGY

## 2024-04-14 MED ORDER — FLUCONAZOLE 100 MG PO TABS: 100.0000 mg | ORAL_TABLET | Freq: Every day | ORAL | 0 refills | Status: AC

## 2024-04-14 NOTE — Patient Instructions (Signed)
 VISIT SUMMARY:  Today, we discussed your ongoing issues with COPD, GERD, and oral and esophageal candidiasis. We reviewed your symptoms, including shortness of breath, cough, and sore throat, and adjusted your treatment plan to help manage these conditions more effectively.  YOUR PLAN:  -CHRONIC OBSTRUCTIVE PULMONARY DISEASE (COPD): COPD is a chronic lung condition that makes it hard to breathe, especially during coughing episodes. To help manage your symptoms, continue using your Brevstreet inhaler and make sure to rinse your mouth well after each use, adding a little baking soda to the water to prevent mouth soreness and irritation. You also received a flu shot today to help protect your respiratory health.  -GASTROESOPHAGEAL REFLUX DISEASE (GERD) WITH ESOPHAGITIS AND ULCERS: GERD is a condition where stomach acid frequently flows back into the tube connecting your mouth and stomach, causing irritation. This has led to esophagitis and ulcers, contributing to your sore throat and cough. Continue taking the increased dose of omeprazole  to reduce acid production and follow the meat-free diet for eight weeks as recommended.  -ORAL AND ESOPHAGEAL CANDIDIASIS: Oral and esophageal candidiasis is a fungal infection in your mouth and esophagus, likely caused by not rinsing your mouth after using your inhaler. You have been prescribed an antifungal tablet to treat this infection. Make sure to rinse your mouth well after using your inhaler to prevent this from happening again.  INSTRUCTIONS:  You are scheduled for a follow-up endoscopy and a colonoscopy. Please continue to avoid meats for eight weeks as part of your dietary management. Follow up with your gastroenterologist as planned.

## 2024-04-14 NOTE — Progress Notes (Signed)
 Subjective:    Patient ID: Tammy Lamb, female    DOB: 02-16-1951, 73 y.o.   MRN: [id number]  Patient Care Team: Sherial Bail, MD as PCP - General (Internal Medicine) Darliss Rogue, MD as PCP - Cardiology (Cardiology) Tamea Dedra CROME, MD as Consulting Physician (Pulmonary Disease)  Chief Complaint  Patient presents with   COPD    Cough and wheezing.    BACKGROUND/INTERVAL:Patient is a 73 year old recent former smoker (March 2024, 49 PY) history as noted below who presents for follow-up of an lung nodule incidentally found on lung cancer screening CT. She was also noted to have a giant paraesophageal hernia with intrathoracic stomach. She underwent surgery for repair on 19 June 2023. She did have a somewhat complicated postsurgical course.  Previously patient had stent placement in March 2024 for unstable angina stent was placed to the RCA lesion that was the culprit.  Patient was last seen on 12 Dec 2023, respiratory wise, no new issues.  She is currently not experiencing any dyspnea.  Her major issues are those of back pain.   HPI Discussed the use of AI scribe software for clinical note transcription with the patient, who gave verbal consent to proceed.  History of Present Illness   Tammy Lamb is a 73 year old female with COPD who presents with shortness of breath and cough.  She experiences shortness of breath, particularly during coughing episodes. The cough is sometimes productive with clear or yellow sputum. Her breathing is generally manageable until the onset of coughing, which then makes it difficult to catch her breath.  She has a history of a large hiatal hernia that was surgically reduced to a medium size. The hernia was previously very large and located in her chest, causing significant discomfort. Post-surgery, she has been advised to maintain or lose weight and follow anti-reflux measures.  She has been experiencing a sore throat, initially thought  to be due to a sinus infection, but later determined to be related to acid reflux. Her throat is described as having 'all kinds of colors' and being very sore. She has been prescribed an increased dose of omeprazole  due to the presence of ulcers and erosions in her throat.  Recent EGD also showed that she had Candida esophagitis.  To her knowledge she has not received treatment for this.  She is currently using an inhaler and has been advised to rinse her mouth well after use to prevent irritation and yeast infection. She was not previously aware of the need to rinse after using the inhaler.  She is scheduled for a follow-up endoscopy and a colonoscopy. She has been advised to avoid meats for eight weeks as part of her dietary management.       Review of Systems A 10 point review of systems was performed and it is as noted above otherwise negative.   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 11/08/2023   Acute on chronic diastolic CHF (congestive heart failure) (HCC) 06/22/2023   COPD (chronic obstructive pulmonary disease) (HCC) 06/21/2023   Acute hypoxic respiratory failure (HCC) 06/21/2023   Elevated LFTs 06/21/2023   Protein calorie malnutrition (HCC) 06/21/2023   Depression 06/21/2023   Acute anemia 06/21/2023   Hyponatremia 06/21/2023   Hiatal hernia with gastroesophageal reflux 06/19/2023   S/P repair of paraesophageal hernia 06/19/2023   Primary osteoarthritis of right knee 03/27/2023   Chronic pain of left knee 03/27/2023  Influenza A 10/20/2022   Coronavirus infection 10/20/2022   Chest pain 10/19/2022   Coronary artery disease involving native coronary artery of native heart with unstable angina pectoris (HCC) 10/19/2022   Status post primary angioplasty with coronary stent 10/19/2022   Unstable angina (HCC) 10/18/2022   COPD with acute exacerbation (HCC) 10/18/2022   AKI (acute kidney injury) (HCC) 10/18/2022    Ulcerative colitis (HCC) 10/18/2022   Bilateral hip pain 12/26/2021   Thoracic disc herniation 08/25/2021   Radicular pain of thoracic region 08/25/2021   Atherosclerotic peripheral vascular disease with intermittent claudication (HCC) 03/08/2021   Benign essential HTN 03/08/2021   Bilateral carotid artery stenosis 03/08/2021   Hyperlipidemia, mixed 03/08/2021   Bilateral occipital neuralgia 09/16/2020   Chronic SI joint pain 09/16/2020   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   High-tone pelvic floor dysfunction 08/21/2019   Recurrent UTI 08/21/2019   S/P cervical spinal fusion (C3-C4 ACDF (03/2019) 04/29/2019   Cervicalgia 04/29/2019   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   Cervical facet joint syndrome 10/01/2018   DDD (degenerative disc disease), cervical 10/01/2018   Chronic radicular cervical pain 10/01/2018   Compression fracture of lumbar vertebra, non-traumatic, sequela 02/28/2018   Lumbar post-laminectomy syndrome 02/28/2018   Spinal stenosis, lumbar region, with neurogenic claudication 09/27/2017   Lumbar radiculopathy 09/27/2017   Lumbar degenerative disc disease 09/27/2017   Lumbar facet arthropathy 09/27/2017   Chronic pain syndrome 09/27/2017   Neurogenic claudication 05/30/2017    Social History   Tobacco Use   Smoking status: Former    Current packs/day: 0.00    Average packs/day: 1 pack/day for 56.2 years (56.2 ttl pk-yrs)    Types: Cigarettes    Start date: 59    Quit date: 09/22/2022    Years since quitting: 1.5    Passive exposure: Past   Smokeless tobacco: Never   Tobacco comments:    Quit in March of 2024.  Substance Use Topics   Alcohol use: Not Currently    Comment: Occassional,    Allergies  Allergen Reactions   Aspirin  Other (See Comments)    GERD   Sulfa Antibiotics Nausea And Vomiting    Current Meds  Medication Sig   acetaminophen  (TYLENOL ) 500 MG tablet Take 1,000 mg by mouth every 6 (six) hours as  needed for moderate pain or headache.   albuterol  (VENTOLIN  HFA) 108 (90 Base) MCG/ACT inhaler Inhale 2 puffs into the lungs every 6 (six) hours as needed for wheezing or shortness of breath.   ascorbic acid  (VITAMIN C) 1000 MG tablet Take 1,000 mg by mouth once a week.   aspirin  EC (ASPIRIN  LOW DOSE) 81 MG tablet TAKE 1 TABLET (81 MG TOTAL) BY MOUTH DAILY. SWALLOW WHOLE.   atorvastatin  (LIPITOR ) 80 MG tablet Take 80 mg by mouth at bedtime.   budeson-glycopyrrolate -formoterol  (BREZTRI  AEROSPHERE) 160-9-4.8 MCG/ACT AERO inhaler Inhale 2 puffs into the lungs in the morning and at bedtime.   cephALEXin  (KEFLEX ) 250 MG capsule Take 1 capsule (250 mg total) by mouth daily.   cholecalciferol  (VITAMIN D ) 25 MCG (1000 UNIT) tablet Take 1,000 Units by mouth at bedtime.   Cinnamon 500 MG TABS Take 500 mg by mouth in the morning and at bedtime.   citalopram  (CELEXA ) 20 MG tablet Take 20 mg by mouth every morning.   dicyclomine (BENTYL) 10 MG capsule Take 10 mg by mouth 3 (three) times daily before meals.   DULoxetine  (CYMBALTA ) 60 MG capsule Take 60 mg by mouth daily.   feeding  supplement (ENSURE ENLIVE / ENSURE PLUS) LIQD Take 237 mLs by mouth 2 (two) times daily between meals.   fluconazole  (DIFLUCAN ) 100 MG tablet Take 1 tablet (100 mg total) by mouth daily for 14 days.   gabapentin  (NEURONTIN ) 300 MG capsule Take 3 capsules (900 mg total) by mouth 2 (two) times daily.   HYDROcodone -acetaminophen  (NORCO) 7.5-325 MG tablet Take 1 tablet by mouth every 6 (six) hours as needed for moderate pain (pain score 4-6).   HYDROcodone -acetaminophen  (NORCO) 7.5-325 MG tablet Take 1 tablet by mouth every 6 (six) hours as needed for moderate pain (pain score 4-6).   hydrOXYzine  (ATARAX ) 25 MG tablet Take 1 tablet (25 mg total) by mouth at bedtime.   metoprolol  tartrate (LOPRESSOR ) 25 MG tablet Take 1 tablet (25 mg total) by mouth 2 (two) times daily.   Multiple Vitamins-Minerals (HAIR SKIN NAILS PO) Take 1 tablet by  mouth in the morning and at bedtime.   nitroGLYCERIN  (NITROSTAT ) 0.4 MG SL tablet Place 1 tablet (0.4 mg total) under the tongue every 5 (five) minutes as needed for chest pain.   omeprazole -sodium bicarbonate (ZEGERID) 40-1100 MG capsule Take 1 capsule by mouth daily before breakfast.   oxybutynin  (DITROPAN -XL) 10 MG 24 hr tablet Take 1 tablet (10 mg total) by mouth at bedtime.   Pentosan Polysulfate Sodium  150 MG CPDR Take 1 capsule by mouth 2 (two) times daily.   sulfaSALAzine  (AZULFIDINE ) 500 MG tablet Take 500 mg by mouth 2 (two) times daily.   tiZANidine  (ZANAFLEX ) 4 MG tablet Take 1 tablet (4 mg total) by mouth every 12 (twelve) hours as needed.   TURMERIC CURCUMIN PO Take 1 capsule by mouth daily.   valsartan (DIOVAN) 160 MG tablet Take 160 mg by mouth daily.   vitamin B-12 (CYANOCOBALAMIN ) 1000 MCG tablet Take 1,000 mcg by mouth daily.    Immunization History  Administered Date(s) Administered   Fluad Trivalent(High Dose 65+) 05/01/2023   Influenza Inj Mdck Quad Pf 05/17/2021   Janssen (J&J) SARS-COV-2 Vaccination 11/24/2019   Moderna Sars-Covid-2 Vaccination 07/25/2020   PNEUMOCOCCAL CONJUGATE-20 06/28/2022   Tdap 05/17/2021        Objective:     BP (!) 142/90   Pulse (!) 58   Temp 97.6 F (36.4 C) (Temporal)   Ht 5' 5 (1.651 m)   Wt 203 lb (92.1 kg)   SpO2 96%   BMI 33.78 kg/m   SpO2: 96 %  GENERAL: Obese woman, no acute distress, fully ambulatory, no conversational dyspnea. HEAD: Normocephalic, atraumatic.  EYES: Pupils equal, round, reactive to light.  No scleral icterus.  MOUTH: Multiple fillings noted at teeth.  Oral mucosa moist.  No thrush. NECK: Supple. No thyromegaly. Trachea midline. No JVD.  No adenopathy. PULMONARY: Good air entry bilaterally.  Coarse, otherwise, no adventitious sounds. CARDIOVASCULAR: S1 and S2. Regular rate and rhythm.  No rubs, murmurs or gallops heard. ABDOMEN: Obese, otherwise benign. MUSCULOSKELETAL: No joint deformity, no  clubbing, no edema.  NEUROLOGIC: No overt focal deficit, no gait disturbance, speech is fluent. SKIN: Intact,warm,dry.  On limited exam, no overt rashes. PSYCH: Mood and behavior normal.       Assessment & Plan:     ICD-10-CM   1. Stage 2 moderate COPD by GOLD classification (HCC)  J44.9     2. Hiatal hernia with GERD  K44.9    K21.9     3. Candida esophagitis (HCC)  B37.81     4. S/P Nissen fundoplication (without gastrostomy tube) procedure  S01.109  5. Need for influenza vaccination  Z23      Meds ordered this encounter  Medications   fluconazole  (DIFLUCAN ) 100 MG tablet    Sig: Take 1 tablet (100 mg total) by mouth daily for 14 days.    Dispense:  14 tablet    Refill:  0   Discussion:    Chronic obstructive pulmonary disease (COPD) COPD with associated shortness of breath, exacerbated by coughing episodes. Coughing is sometimes productive with clear or yellow sputum. Breathing is generally well-managed but worsens with coughing. - Advise rinsing mouth well after using Breztri  inhaler, adding a little baking soda to the water to prevent mouth soreness and irritation. - Administer flu vaccine.  Gastroesophageal reflux disease (GERD) with esophagitis and ulcers GERD with esophagitis and multiple ulcers, contributing to sore throat and cough. Increased acid production requires management with acid reducers. She is on a meat-free diet for eight weeks as part of reflux management. - Continue increased dose of omeprazole  for acid reduction. - Maintain meat-free diet for eight weeks as recommended by gastroenterologist.  Oral and esophageal candidiasis Oral and esophageal candidiasis likely secondary to inhaler use without proper rinsing, contributing to esophageal irritation and possibly cough. - Prescribe antifungal tablet (fluconazole ) for treatment of esophageal candidiasis. - Advise rinsing mouth well after using inhaler to prevent recurrence of candidiasis.   Will  see patient in follow-up in 4 months time she is to contact us  prior to that time should any new difficulties arise.  Follow-up with primary care and gastroenterology as scheduled.    Advised if symptoms do not improve or worsen, to please contact office for sooner follow up or seek emergency care.    I spent 40 minutes of dedicated to the care of this patient on the date of this encounter to include pre-visit review of records, face-to-face time with the patient discussing conditions above, post visit ordering of testing, clinical documentation with the electronic health record, making appropriate referrals as documented, and communicating necessary findings to members of the patients care team.     C. Leita Sanders, MD Advanced Bronchoscopy P[facility]    *This note was generated using voice recognition software/Dragon and/or AI transcription program.  Despite best efforts to proofread, errors can occur which can change the meaning. Any transcriptional errors that result from this process are unintentional and may not be fully corrected at the time of dictation.

## 2024-04-18 ENCOUNTER — Other Ambulatory Visit: Payer: Self-pay | Admitting: Internal Medicine

## 2024-04-18 DIAGNOSIS — Z1231 Encounter for screening mammogram for malignant neoplasm of breast: Secondary | ICD-10-CM

## 2024-05-06 ENCOUNTER — Other Ambulatory Visit: Payer: Self-pay | Admitting: Acute Care

## 2024-05-06 DIAGNOSIS — Z87891 Personal history of nicotine dependence: Secondary | ICD-10-CM

## 2024-05-06 DIAGNOSIS — Z122 Encounter for screening for malignant neoplasm of respiratory organs: Secondary | ICD-10-CM

## 2024-05-12 ENCOUNTER — Encounter: Payer: Self-pay | Admitting: Nurse Practitioner

## 2024-05-12 ENCOUNTER — Ambulatory Visit: Attending: Nurse Practitioner | Admitting: Nurse Practitioner

## 2024-05-12 VITALS — BP 145/100 | HR 54 | Temp 97.2°F | Resp 18 | Ht 65.0 in | Wt 205.0 lb

## 2024-05-12 DIAGNOSIS — M4692 Unspecified inflammatory spondylopathy, cervical region: Secondary | ICD-10-CM | POA: Insufficient documentation

## 2024-05-12 DIAGNOSIS — G894 Chronic pain syndrome: Secondary | ICD-10-CM | POA: Insufficient documentation

## 2024-05-12 DIAGNOSIS — M5412 Radiculopathy, cervical region: Secondary | ICD-10-CM | POA: Diagnosis present

## 2024-05-12 DIAGNOSIS — M5414 Radiculopathy, thoracic region: Secondary | ICD-10-CM | POA: Diagnosis present

## 2024-05-12 DIAGNOSIS — G8929 Other chronic pain: Secondary | ICD-10-CM | POA: Diagnosis present

## 2024-05-12 DIAGNOSIS — M961 Postlaminectomy syndrome, not elsewhere classified: Secondary | ICD-10-CM | POA: Diagnosis present

## 2024-05-12 DIAGNOSIS — Z79899 Other long term (current) drug therapy: Secondary | ICD-10-CM | POA: Insufficient documentation

## 2024-05-12 DIAGNOSIS — M5124 Other intervertebral disc displacement, thoracic region: Secondary | ICD-10-CM | POA: Diagnosis present

## 2024-05-12 DIAGNOSIS — Z981 Arthrodesis status: Secondary | ICD-10-CM | POA: Diagnosis present

## 2024-05-12 DIAGNOSIS — M5030 Other cervical disc degeneration, unspecified cervical region: Secondary | ICD-10-CM | POA: Diagnosis present

## 2024-05-12 MED ORDER — HYDROCODONE-ACETAMINOPHEN 7.5-325 MG PO TABS: 1.0000 | ORAL_TABLET | Freq: Three times a day (TID) | ORAL | 0 refills | Status: AC | PRN

## 2024-05-12 MED ORDER — TIZANIDINE HCL 4 MG PO TABS: 4.0000 mg | ORAL_TABLET | Freq: Two times a day (BID) | ORAL | 5 refills | Status: AC | PRN

## 2024-05-12 MED ORDER — NALOXONE HCL 4 MG/0.1ML NA LIQD: 1.0000 | NASAL | 1 refills | Status: AC | PRN

## 2024-05-12 NOTE — Progress Notes (Signed)
 PROVIDER NOTE: Interpretation of information contained herein should be left to medically-trained personnel. Specific patient instructions are provided elsewhere under Patient Instructions section of medical record. This document was created in part using AI and STT-dictation technology, any transcriptional errors that may result from this process are unintentional.  Patient: Tammy Lamb  Service: E/M   PCP: Sherial Bail, MD  DOB: [birth year]  DOS: 05/12/2024  Provider: Emmy MARLA Blanch, NP  MRN: [id number]  Delivery: Face-to-face  Specialty: Interventional Pain Management  Type: Established Patient  Setting: Ambulatory outpatient facility  Specialty designation: 09  Referring Prov.: Sherial Bail, MD  Location: Outpatient office facility       History of present illness (HPI) Tammy Lamb, a 73 y.o. year old female, is here today because of her Failed back surgical syndrome [M96.1]. Tammy Lamb primary complain today is Back Pain  Pertinent problems: Tammy Lamb has Neurogenic claudication; Spinal stenosis, lumbar region, with neurogenic claudication; Lumbar radiculopathy; Lumbar degenerative disc disease; Lumbar facet arthropathy; Chronic pain syndrome; Compression fracture of lumbar vertebra, non-traumatic, sequela; Lumbar post-laminectomy syndrome; Cervical facet joint syndrome; Chronic radicular cervical pain; Inflammatory spondylopathy of cervical region [facility]); and S/P cervical spinal fusion (C3-C4 ACDF (03/2019) on their pertinent problem list.  Pain Assessment: Severity of Chronic pain is reported as a 9 /10. Location: Back Lower/Lower Back down bilateral legs, had a fall on Saturday.. Onset: More than a month ago. Quality: Aching, Pressure, Sore, Tender, Shooting. Timing: Constant. Modifying factor(s): rest, Laying down. Vitals:  height is 5' 5 (1.651 m) and weight is 205 lb (93 kg). Her temporal temperature is 97.2 F (36.2 C) (abnormal). Her blood pressure is 145/100 (abnormal)  and her pulse is 54 (abnormal). Her respiration is 18 and oxygen saturation is 95%.  BMI: Estimated body mass index is 34.11 kg/m as calculated from the following:   Height as of this encounter: 5' 5 (1.651 m).   Weight as of this encounter: 205 lb (93 kg).  Last encounter: Visit date not found Last procedure: 02/13/2024  Reason for encounter: medication management. No change in medical history since last visit.  Patient's pain is at baseline.  Patient continues multimodal pain regimen as prescribed.  States that it provides pain relief and improvement in functional status.   Discussed the use of AI scribe software for clinical note transcription with the patient, who gave verbal consent to proceed.  History of Present Illness   Tammy Lamb is a 73 year old female with osteoarthritis and rheumatoid arthritis who presents with pain following recent falls.  She experienced two recent falls, one on October 6th or 9th at a friend's house and another this past Saturday while getting off a bus. During the first fall, she tripped over her shoe and fell face first, resulting in a knot and bruises on her legs. The second fall resulted in her leg being trapped under her, causing significant pain.  She has a history of osteoarthritis and rheumatoid arthritis, with increased pain during cold weather. She uses heat therapy for relief and finds it beneficial, especially in colder weather.   She is currently taking hydrocodone  7.5-325 mg every eight hours as needed for pain, with no reported side effects. She sometimes takes an additional dose when the pain becomes severe, particularly after her falls. She has not had any injections or x-rays for her knee recently.  She has undergone two surgeries on her lower back performed by Dr. Clois, with the second surgery being corrective for  the first.     Pharmacotherapy Assessment   Hydrocodone -acetaminophen  (Norco) 7.5-325 every 8 hours as needed for pain.  MME=30 Monitoring: [place] PMP: PDMP reviewed during this encounter.       Pharmacotherapy: No side-effects or adverse reactions reported. Compliance: No problems identified. Effectiveness: Clinically acceptable.  Tammy Lamb, [place]  05/12/2024 11:05 AM  Sign when Signing Visit Nursing Pain Medication Assessment:  Safety precautions to be maintained throughout the outpatient stay will include: orient to surroundings, keep bed in low position, maintain call bell within reach at all times, provide assistance with transfer out of bed and ambulation.  Medication Inspection Compliance: Pill count conducted under aseptic conditions, in front of the patient. Neither the pills nor the bottle was removed from the patient's sight at any time. Once count was completed pills were immediately returned to the patient in their original bottle.  Medication: Hydrocodone /APAP Pill/Patch Count: 49 of 90 pills/patches remain Pill/Patch Appearance: Markings consistent with prescribed medication Bottle Appearance: Standard pharmacy container. Clearly labeled. Filled Date: 09 / 29 / 2025 Last Medication intake:  Today    UDS:  Summary  Date Value Ref Range Status  10/30/2023 FINAL  Final    Comment:    ==================================================================== [employer] Select 13 (MW) ==================================================================== Test                             Result       Flag       Units  Drug Present and Declared for Prescription Verification   Hydrocodone                     1103         EXPECTED   ng/mg creat   Norhydrocodone                 1955         EXPECTED   ng/mg creat    Sources of hydrocodone  include scheduled prescription medications.    Norhydrocodone is an expected metabolite of hydrocodone .  ==================================================================== Test                      Result    Flag   Units      Ref Range   Creatinine              33                mg/dL      >=79 ==================================================================== Declared Medications:  The flagging and interpretation on this report are based on the  following declared medications.  Unexpected results may arise from  inaccuracies in the declared medications.   **Note: The testing scope of this panel includes these medications:   Hydrocodone  (Norco)   **Note: The testing scope of this panel does not include the  following reported medications:   Acetaminophen  (Tylenol )  Acetaminophen  (Norco)  Albuterol  (Ventolin  HFA)  Aspirin   Atorvastatin  (Lipitor )  Budesonide (Breztri  Aerosphere)  Cephalexin  (Keflex )  Cinnamon  Citalopram  (Celexa )  Duloxetine  (Cymbalta )  Fish Oil  Formoterol  (Breztri  Aerosphere)  Gabapentin  (Neurontin )  Glycopyrrolate  (Breztri  Aerosphere)  Hydroxyzine  (Atarax )  Metoprolol  (Lopressor )  Multivitamin  Nitroglycerin  (Nitrostat )  Oxybutynin  (Ditropan )  Pantoprazole  (Protonix )  Pentosan (Elmiron )  Sulfasalazine  (Azulfidine )  Supplement  Tizanidine  (Zanaflex )  Torsemide  (Demadex )  Valsartan (Diovan)  Vitamin B12  Vitamin C  Vitamin D3 ==================================================================== For clinical consultation, please call [phone]. ====================================================================  No results found for: CBDTHCR No results found for: D8THCCBX No results found for: D9THCCBX  ROS  Constitutional: Denies any fever or chills Gastrointestinal: No reported hemesis, hematochezia, vomiting, or acute GI distress Musculoskeletal: Low back pain Neurological: No reported episodes of acute onset apraxia, aphasia, dysarthria, agnosia, amnesia, paralysis, loss of coordination, or loss of consciousness  Medication Review  Cinnamon, DULoxetine , HYDROcodone -acetaminophen , Multiple Vitamins-Minerals, Pentosan Polysulfate Sodium , Turmeric, acetaminophen , albuterol , ascorbic acid ,  aspirin  EC, atorvastatin , budesonide-glycopyrrolate -formoterol , cephALEXin , cholecalciferol , citalopram , cyanocobalamin , dicyclomine, feeding supplement, gabapentin , hydrOXYzine , metoprolol  tartrate, nitroGLYCERIN , omeprazole , omeprazole -sodium bicarbonate, oxybutynin , sulfaSALAzine , tiZANidine , and valsartan  History Review  Allergy: Ms. Kliethermes is allergic to aspirin  and sulfa antibiotics. Drug: Ms. Texidor  reports no history of drug use. Alcohol:  reports that she does not currently use alcohol. Tobacco:  reports that she quit smoking about 19 months ago. Her smoking use included cigarettes. She started smoking about 57 years ago. She has a 56.2 pack-year smoking history. She has been exposed to tobacco smoke. She has never used smokeless tobacco. Social: Ms. Birchard  reports that she quit smoking about 19 months ago. Her smoking use included cigarettes. She started smoking about 57 years ago. She has a 56.2 pack-year smoking history. She has been exposed to tobacco smoke. She has never used smokeless tobacco. She reports that she does not currently use alcohol. She reports that she does not use drugs. Medical:  has a past medical history of Anemia, Anxiety, Aortic atherosclerosis, Arthritis, Bilateral carotid artery stenosis, CAD (coronary artery disease), native coronary artery (10/05/2022), Cerebral microvascular disease, Chronic lower back pain, Colitis, ulcerative (HCC), COPD (chronic obstructive pulmonary disease) (HCC), DDD (degenerative disc disease), cervical, Depression, Diastolic dysfunction (10/19/2022), Dyspnea, GERD (gastroesophageal reflux disease), Hiatal hernia, History of 2019 novel coronavirus disease (COVID-19) (02/2021), HLD (hyperlipidemia), Hypertension, Interstitial cystitis, Long-term use of aspirin  therapy, Lumbar disc disease, Motion sickness, On chronic clopidogrel  therapy, Paraesophageal hernia, Parkinson's disease (HCC), Pulmonary hypertension (HCC) (04/18/2021), PVD (peripheral  vascular disease), T2DM (type 2 diabetes mellitus) (HCC), TIA (transient ischemic attack) (2015), Urinary frequency, and Wears dentures. Surgical: Ms. Jablon  has a past surgical history that includes Tonsillectomy; Cholecystectomy; Hysterectomy abdominal with salpingo-oophorectomy; Diagnostic laparoscopy; Bladder suspension; Colonoscopy; Knee arthroscopy (Left, 02/13/2014); Ankle arthroscopy (Left, 03/22/2016); Tendon repair (Left, 03/22/2016); Lumbar laminectomy/decompression microdiscectomy (N/A, 05/30/2017); Anterior cervical decomp/discectomy fusion (N/A, 04/09/2019); Lumbar laminectomy/decompression microdiscectomy (N/A, 05/27/2021); LEFT HEART CATH AND CORONARY ANGIOGRAPHY (N/A, 10/18/2022); CORONARY STENT INTERVENTION (N/A, 10/18/2022); Esophagogastroduodenoscopy (egd) with propofol  (N/A, 06/06/2023); Xi robotic assisted paraesophageal hernia repair (N/A, 06/19/2023); Insertion of mesh (06/19/2023); and Esophagogastroduodenoscopy (N/A, 04/11/2024). Family: family history includes COPD in her father; Diabetes in her mother; Heart disease in her father; Hypertension in her father and mother.  Laboratory Chemistry Profile   Renal Lab Results  Component Value Date   BUN 8 06/23/2023   CREATININE 0.74 06/23/2023   GFRAA >60 04/02/2019   GFRNONAA >60 06/23/2023    Hepatic Lab Results  Component Value Date   AST 61 (H) 06/22/2023   ALT 40 06/22/2023   ALBUMIN  3.1 (L) 06/22/2023   ALKPHOS 36 (L) 06/22/2023    Electrolytes Lab Results  Component Value Date   NA 134 (L) 06/23/2023   K 3.8 06/23/2023   CL 99 06/23/2023   CALCIUM  9.0 06/23/2023   MG 1.9 06/23/2023    Bone No results found for: VD25OH, VD125OH2TOT, CI6874NY7, CI7874NY7, 25OHVITD1, 25OHVITD2, 25OHVITD3, TESTOFREE, TESTOSTERONE  Inflammation (CRP: Acute Phase) (ESR: Chronic Phase) No results found for: CRP, ESRSEDRATE, LATICACIDVEN  Note: Above Lab results reviewed.  Recent Imaging Review  CT  ABDOMEN PELVIS W CONTRAST CLINICAL DATA:  Abdominal pain and bloating. Nausea and vomiting. Hiatal hernia and prior fundoplication.  EXAM: CT ABDOMEN AND PELVIS WITH CONTRAST  TECHNIQUE: Multidetector CT imaging of the abdomen and pelvis was performed using the standard protocol following bolus administration of intravenous contrast.  RADIATION DOSE REDUCTION: This exam was performed according to the departmental dose-optimization program which includes automated exposure control, adjustment of the mA and/or kV according to patient size and/or use of iterative reconstruction technique.  CONTRAST:  OMNIPAQUE  IOHEXOL  300 MG/ML  SOLN  COMPARISON:  07/23/2023  FINDINGS: Lower Chest: No acute findings.  Hepatobiliary: No suspicious hepatic masses identified. Prior cholecystectomy. No evidence of biliary obstruction.  Pancreas:  No mass or inflammatory changes.  Spleen: Within normal limits in size and appearance.  Adrenals/Urinary Tract: No suspicious masses identified. No evidence of ureteral calculi or hydronephrosis.  Stomach/Bowel: Mild-to-moderate dilatation of the visualized distal esophagus is seen, which contains an air-fluid level. This has increased since prior study, suspicious for esophageal stricture. Fundoplication wrap is stable in appearance, without definite recurrent hiatal hernia. No evidence of bowel obstruction, inflammatory process or abnormal fluid collections.  Vascular/Lymphatic: No pathologically enlarged lymph nodes. No acute vascular findings.  Reproductive: Prior hysterectomy noted. Adnexal regions are unremarkable in appearance.  Other:  None.  Musculoskeletal:  No suspicious bone lesions identified.  IMPRESSION: Increased dilatation of the visualized distal esophagus with air-fluid level, suspicious for esophageal stricture. Consider upper endoscopy or esophagram for further evaluation.  Stable appearance of fundoplication wrap,  without definite recurrent hiatal hernia.  Electronically Signed   By: Norleen DELENA Kil M.D.   On: 03/12/2024 09:59 Note: Reviewed        Physical Exam  Vitals: BP (!) 145/100 (BP Location: Right Arm, Patient Position: Sitting)   Pulse (!) 54   Temp (!) 97.2 F (36.2 C) (Temporal)   Resp 18   Ht 5' 5 (1.651 m)   Wt 205 lb (93 kg)   SpO2 95%   BMI 34.11 kg/m  BMI: Estimated body mass index is 34.11 kg/m as calculated from the following:   Height as of this encounter: 5' 5 (1.651 m).   Weight as of this encounter: 205 lb (93 kg). Ideal: Ideal body weight: 57 kg (125 lb 10.6 oz) Adjusted ideal body weight: 71.4 kg (157 lb 6.4 oz) General appearance: Well nourished, well developed, and well hydrated. In no apparent acute distress Mental status: Alert, oriented x 3 (person, place, & time)       Respiratory: No evidence of acute respiratory distress Eyes: PERLA  Musculoskeletal: +LBP Assessment   Diagnosis Status  1. Failed back surgical syndrome   2. Medication management   3. Chronic pain syndrome   4. Radicular pain of thoracic region   5. DDD (degenerative disc disease), cervical   6. Thoracic disc herniation   7. S/P cervical spinal fusion (C3-C4 ACDF (03/2019)   8. Inflammatory spondylopathy of cervical region   9. Chronic radicular cervical pain    Controlled Controlled Controlled   Updated Problems: Problem  Medication Management    Plan of Care  Problem-specific:  Assessment and Plan    Chronic pain syndrome: Patient's pain is well-controlled with hydrocodone , will continue on current medication regimen.  Prescribing drug monitoring (PDMP) reviewed, finding consistent with the use of prescribed medication and no evidence of narcotic misuse or abuse.  Urine drug screening (UDS) up to date  and consistent with the use of prescribed medication.  No side effects or adverse reaction reported to medication.  The patient was advised to keep the room free of clutter  and ensure that objects are not placed in walking pads to reduce the risk of falls.  She was also advised to use a cane while walking for added stability and safety. - Advise against exceeding prescribed hydrocodone  dosage. - Continue hydrocodone  7.5/325 mg every 8 hours as needed. - Refill hydrocodone  prescription.  Chronic low back pain, post-surgical: Chronic pain post-surgery.  - Continue hydrocodone  and tizanidine . - Refill tizanidine  prescription. Medications   HYDROcodone -acetaminophen  (NORCO) 7.5-325 MG tablet    Sig: Take 1 tablet by mouth every 8 (eight) hours as needed for moderate pain (pain score 4-6).    Dispense:  90 tablet    Refill:  0   HYDROcodone -acetaminophen  (NORCO) 7.5-325 MG tablet    Sig: Take 1 tablet by mouth every 8 (eight) hours as needed for moderate pain (pain score 4-6).    Dispense:  90 tablet    Refill:  0   HYDROcodone -acetaminophen  (NORCO) 7.5-325 MG tablet    Sig: Take 1 tablet by mouth every 8 (eight) hours as needed for moderate pain (pain score 4-6).    Dispense:  90 tablet    Refill:  0   tiZANidine  (ZANAFLEX ) 4 MG tablet    Sig: Take 1 tablet (4 mg total) by mouth every 12 (twelve) hours as needed.    Dispense:  60 tablet    Refill:  5    Fill one day early if pharmacy is closed on scheduled refill date. Generic permitted. Do not send renewal requests. Void any older duplicate prescription or refill(s) that may be on file.   Osteoarthritis Increased pain in cold weather. Heat therapy recommended for relief. - Advise heat therapy for pain relief, especially in cold weather.   Medication management:  Patient's pain is well-controlled with hydrocodone , will continue on current medication regimen.  Prescribing drug monitoring (PDMP) reviewed, finding consistent with the use of prescribed medication and no evidence of narcotic misuse or abuse.        Ms. Rainelle D Welke has a current medication list which includes the following long-term  medication(s): albuterol , atorvastatin , dicyclomine, duloxetine , gabapentin , metoprolol  tartrate, nitroglycerin , sulfasalazine , valsartan, and tizanidine .  Pharmacotherapy (Medications Ordered): Meds ordered this encounter  Medications   HYDROcodone -acetaminophen  (NORCO) 7.5-325 MG tablet    Sig: Take 1 tablet by mouth every 8 (eight) hours as needed for moderate pain (pain score 4-6).    Dispense:  90 tablet    Refill:  0   HYDROcodone -acetaminophen  (NORCO) 7.5-325 MG tablet    Sig: Take 1 tablet by mouth every 8 (eight) hours as needed for moderate pain (pain score 4-6).    Dispense:  90 tablet    Refill:  0   HYDROcodone -acetaminophen  (NORCO) 7.5-325 MG tablet    Sig: Take 1 tablet by mouth every 8 (eight) hours as needed for moderate pain (pain score 4-6).    Dispense:  90 tablet    Refill:  0   tiZANidine  (ZANAFLEX ) 4 MG tablet    Sig: Take 1 tablet (4 mg total) by mouth every 12 (twelve) hours as needed.    Dispense:  60 tablet    Refill:  5    Fill one day early if pharmacy is closed on scheduled refill date. Generic permitted. Do not send renewal requests. Void any older duplicate prescription or refill(s) that may be  on file.   Orders:  No orders of the defined types were placed in this encounter.       Return in about 3 months (around 08/12/2024) for (F2F), (MM), Emmy Blanch NP.    Recent Visits Date Type Provider Dept  02/13/24 Procedure visit Marcelino Nurse, MD [facility]  Showing recent visits within past 90 days and meeting all other requirements Today's Visits Date Type Provider Dept  05/12/24 Office Visit Saory Carriero K, NP [facility]  Showing today's visits and meeting all other requirements Future Appointments Date Type Provider Dept  08/04/24 Appointment Caryle Helgeson K, NP [facility]  Showing future appointments within next 90 days and meeting all other requirements  I discussed the assessment and treatment plan with the  patient. The patient was provided an opportunity to ask questions and all were answered. The patient agreed with the plan and demonstrated an understanding of the instructions.  Patient advised to call back or seek an in-person evaluation if the symptoms or condition worsens.  I personally spent a total of 30 minutes in the care of the patient today including preparing to see the patient, getting/reviewing separately obtained history, performing a medically appropriate exam/evaluation, counseling and educating, placing orders, referring and communicating with other health care professionals, documenting clinical information in the EHR, independently interpreting results, communicating results, and coordinating care.   Note by: Jimi Schappert K Otisha Spickler, NP (TTS and AI technology used. I apologize for any typographical errors that were not detected and corrected.) Date: 05/12/2024; Time: 12:48 PM

## 2024-05-12 NOTE — Progress Notes (Signed)
 Nursing Pain Medication Assessment:  Safety precautions to be maintained throughout the outpatient stay will include: orient to surroundings, keep bed in low position, maintain call bell within reach at all times, provide assistance with transfer out of bed and ambulation.  Medication Inspection Compliance: Pill count conducted under aseptic conditions, in front of the patient. Neither the pills nor the bottle was removed from the patient's sight at any time. Once count was completed pills were immediately returned to the patient in their original bottle.  Medication: Hydrocodone /APAP Pill/Patch Count: 49 of 90 pills/patches remain Pill/Patch Appearance: Markings consistent with prescribed medication Bottle Appearance: Standard pharmacy container. Clearly labeled. Filled Date: 09 / 29 / 2025 Last Medication intake:  Today

## 2024-05-15 ENCOUNTER — Ambulatory Visit: Payer: Self-pay | Admitting: Anesthesiology

## 2024-05-15 ENCOUNTER — Encounter
Admission: RE | Disposition: A | Discharge status: Home / Self Care | Payer: Self-pay | Source: Home / Self Care | Attending: Gastroenterology

## 2024-05-15 ENCOUNTER — Encounter: Payer: Self-pay | Admitting: Gastroenterology

## 2024-05-15 ENCOUNTER — Ambulatory Visit
Admission: RE | Admit: 2024-05-15 | Discharge: 2024-05-15 | Disposition: A | Discharge status: Home / Self Care | Attending: Gastroenterology | Admitting: Gastroenterology

## 2024-05-15 DIAGNOSIS — Z87891 Personal history of nicotine dependence: Secondary | ICD-10-CM | POA: Insufficient documentation

## 2024-05-15 DIAGNOSIS — I110 Hypertensive heart disease with heart failure: Secondary | ICD-10-CM | POA: Insufficient documentation

## 2024-05-15 DIAGNOSIS — Z7902 Long term (current) use of antithrombotics/antiplatelets: Secondary | ICD-10-CM | POA: Diagnosis not present

## 2024-05-15 DIAGNOSIS — Z7951 Long term (current) use of inhaled steroids: Secondary | ICD-10-CM | POA: Insufficient documentation

## 2024-05-15 DIAGNOSIS — I2510 Atherosclerotic heart disease of native coronary artery without angina pectoris: Secondary | ICD-10-CM | POA: Insufficient documentation

## 2024-05-15 DIAGNOSIS — J449 Chronic obstructive pulmonary disease, unspecified: Secondary | ICD-10-CM | POA: Insufficient documentation

## 2024-05-15 DIAGNOSIS — Z79899 Other long term (current) drug therapy: Secondary | ICD-10-CM | POA: Insufficient documentation

## 2024-05-15 DIAGNOSIS — Z7982 Long term (current) use of aspirin: Secondary | ICD-10-CM | POA: Insufficient documentation

## 2024-05-15 DIAGNOSIS — Z833 Family history of diabetes mellitus: Secondary | ICD-10-CM | POA: Diagnosis not present

## 2024-05-15 DIAGNOSIS — M1990 Unspecified osteoarthritis, unspecified site: Secondary | ICD-10-CM | POA: Insufficient documentation

## 2024-05-15 DIAGNOSIS — I509 Heart failure, unspecified: Secondary | ICD-10-CM | POA: Insufficient documentation

## 2024-05-15 DIAGNOSIS — G20A1 Parkinson's disease without dyskinesia, without mention of fluctuations: Secondary | ICD-10-CM | POA: Insufficient documentation

## 2024-05-15 DIAGNOSIS — E1151 Type 2 diabetes mellitus with diabetic peripheral angiopathy without gangrene: Secondary | ICD-10-CM | POA: Diagnosis not present

## 2024-05-15 DIAGNOSIS — Z8719 Personal history of other diseases of the digestive system: Secondary | ICD-10-CM | POA: Insufficient documentation

## 2024-05-15 HISTORY — PX: COLONOSCOPY: SHX5424

## 2024-05-15 SURGERY — COLONOSCOPY
Anesthesia: General

## 2024-05-15 MED ORDER — PROPOFOL 500 MG/50ML IV EMUL
INTRAVENOUS | Status: DC | PRN
Start: 2024-05-15 — End: 2024-05-15
  Administered 2024-05-15: 125 ug/kg/min via INTRAVENOUS

## 2024-05-15 MED ORDER — SODIUM CHLORIDE 0.9 % IV SOLN
INTRAVENOUS | Status: DC
  Administered 2024-05-15: 500 mL via INTRAVENOUS

## 2024-05-15 MED ORDER — PROPOFOL 10 MG/ML IV BOLUS
INTRAVENOUS | Status: AC
  Filled 2024-05-15: qty 20

## 2024-05-15 MED ORDER — PROPOFOL 10 MG/ML IV BOLUS
INTRAVENOUS | Status: DC | PRN
  Administered 2024-05-15: 40 mg via INTRAVENOUS
  Administered 2024-05-15: 50 mg via INTRAVENOUS
  Administered 2024-05-15: 70 mg via INTRAVENOUS
  Administered 2024-05-15 (×2): 50 mg via INTRAVENOUS
  Administered 2024-05-15: 30 mg via INTRAVENOUS

## 2024-05-15 MED ORDER — LIDOCAINE HCL (PF) 2 % IJ SOLN
INTRAMUSCULAR | Status: AC
  Filled 2024-05-15: qty 5

## 2024-05-15 MED ORDER — LIDOCAINE HCL (CARDIAC) PF 100 MG/5ML IV SOSY
PREFILLED_SYRINGE | INTRAVENOUS | Status: DC | PRN
  Administered 2024-05-15: 100 mg via INTRAVENOUS

## 2024-05-15 NOTE — H&P (Signed)
 [name], MD [facility], DHIP [address]  [place], [place] [address]  Main: [phone] Fax:  [phone] Pager: [phone]   Primary Care Physician:  Sherial Bail, MD Primary Gastroenterologist:  Dr. [name]  Pre-Procedure History & Physical: HPI:  Tammy Lamb is a 73 y.o. female is here for an colonoscopy.   Past Medical History:  Diagnosis Date   Anemia    Anxiety    Aortic atherosclerosis    Arthritis    Bilateral carotid artery stenosis    CAD (coronary artery disease), native coronary artery 10/05/2022   a.) cCTA 10/05/2022: Ca2+ = 601 (92nd %ile); LAD 383, RCA 217; b.) LHC 10/18/2022: 85% pRCA ( 3.5 x 15 mm Onyx Frontier DES)   Cerebral microvascular disease    Chronic lower back pain    Colitis, ulcerative (HCC)    COPD (chronic obstructive pulmonary disease) (HCC)    DDD (degenerative disc disease), cervical    a.) s/p ACDF C3-C4 04/09/2019   Depression    Diastolic dysfunction 10/19/2022   a.) TTE 10/19/2022: EF 60-65%, no RWMAs,  G1DD, normal RVSF; b.) TTE 12/04/2022: EF >55%. no RWMAs, G1DD, mild LAE, triv AR/MR/PR, mod TR, PASP 45   Dyspnea    GERD (gastroesophageal reflux disease)    Hiatal hernia    History of 2019 novel coronavirus disease (COVID-19) 02/2021   HLD (hyperlipidemia)    Hypertension    Interstitial cystitis    Long-term use of aspirin  therapy    Lumbar disc disease    a.) s/p L3-L5 laminectomy, decompression, and microdiscectomy 05/30/2017; b.) s/p L4-S1 decompression 05/27/2021   Motion sickness    fair rides   On chronic clopidogrel  therapy    Paraesophageal hernia    Parkinson's disease (HCC)    suspected per dr Maree @ [facility] neuro   Pulmonary hypertension (HCC) 04/18/2021   a.) TTE 04/18/2021: PASP 42.9; b.) TTE 12/04/2022: PASP 45   PVD (peripheral vascular disease)    T2DM (type 2 diabetes mellitus) (HCC)    TIA (transient ischemic attack) 2015   a.) no deficits; per pt her  carvedilol  dose was too high causing hypotension   Urinary frequency    Wears dentures    partial upper and lower    Past Surgical History:  Procedure Laterality Date   ABDOMINAL HYSTERECTOMY     ANKLE ARTHROSCOPY Left 03/22/2016   Procedure: ANKLE ARTHROSCOPY DEBRODEMENT EXTENSIVE LEFT FLEXAR TENDON REPAIR;  Surgeon: Eva Gay, DPM;  Location: [facility];  Service: Podiatry;  Laterality: Left;  WITH POPLITEAL   ANTERIOR CERVICAL DECOMP/DISCECTOMY FUSION N/A 04/09/2019   Procedure: ANTERIOR CERVICAL DECOMPRESSION/DISCECTOMY FUSION 1 LEVEL C3-4;  Surgeon: Clois Fret, MD;  Location: [facility];  Service: Neurosurgery;  Laterality: N/A;   BACK SURGERY     BLADDER SUSPENSION     CHOLECYSTECTOMY     COLONOSCOPY     CORONARY ANGIOPLASTY     CORONARY STENT INTERVENTION N/A 10/18/2022   Procedure: CORONARY STENT INTERVENTION;  Surgeon: Darron Deatrice LABOR, MD;  Location: [facility];  Service: Cardiovascular;  Laterality: N/A;   DIAGNOSTIC LAPAROSCOPY     adhesions   ESOPHAGOGASTRODUODENOSCOPY N/A 04/11/2024   Procedure: EGD (ESOPHAGOGASTRODUODENOSCOPY);  Surgeon: Brooklyn Corinn Skiff, MD;  Location: [facility];  Service: Gastroenterology;  Laterality: N/A;   ESOPHAGOGASTRODUODENOSCOPY (EGD) WITH PROPOFOL  N/A 06/06/2023   Procedure: ESOPHAGOGASTRODUODENOSCOPY (EGD) WITH PROPOFOL ;  Surgeon: Tye Millet, DO;  Location: [facility];  Service: General;  Laterality: N/A;  patient to stay on plavix  and aspirin  due to recent stent placement, Dr. Tye aware   HERNIA REPAIR     HYSTERECTOMY ABDOMINAL WITH SALPINGO-OOPHORECTOMY     INSERTION OF MESH  06/19/2023   Procedure: INSERTION OF MESH;  Surgeon: Jordis Laneta FALCON, MD;  Location: [facility];  Service: General;;  paraesophageal hernia repair   KNEE ARTHROSCOPY Left 02/13/2014   Procedure: LEFT ARTHROSCOPY KNEE, PARTIAL MEDIAL MENISECTOMY AND PLICA, CHONDROPLASTY OF PATELLA FEMORAL JOINT;  Surgeon: Norleen LITTIE Gavel, MD;   Location: [facility];  Service: Orthopedics;  Laterality: Left;   LEFT HEART CATH AND CORONARY ANGIOGRAPHY N/A 10/18/2022   Procedure: LEFT HEART CATH AND CORONARY ANGIOGRAPHY;  Surgeon: Darron Deatrice LABOR, MD;  Location: [facility];  Service: Cardiovascular;  Laterality: N/A;   LUMBAR LAMINECTOMY/DECOMPRESSION MICRODISCECTOMY N/A 05/30/2017   Procedure: LUMBAR LAMINECTOMY/DECOMPRESSION MICRODISCECTOMY 3 LEVELS-L3-S1;  Surgeon: Clois Fret, MD;  Location: [facility];  Service: Neurosurgery;  Laterality: N/A;   LUMBAR LAMINECTOMY/DECOMPRESSION MICRODISCECTOMY N/A 05/27/2021   Procedure: L4-S1 DECOMPRESSION;  Surgeon: Clois Fret, MD;  Location: [facility];  Service: Neurosurgery;  Laterality: N/A;   TENDON REPAIR Left 03/22/2016   Procedure: QOZKJM TENDON REPAIR;  Surgeon: Eva Gay, DPM;  Location: [facility];  Service: Podiatry;  Laterality: Left;   TONSILLECTOMY     XI ROBOTIC ASSISTED PARAESOPHAGEAL HERNIA REPAIR N/A 06/19/2023   Procedure: XI ROBOTIC ASSISTED PARAESOPHAGEAL HERNIA REPAIR, RNFA to assist;  Surgeon: Jordis Laneta FALCON, MD;  Location: [facility];  Service: General;  Laterality: N/A;    Prior to Admission medications   Medication Sig Start Date End Date Taking? Authorizing Provider  acetaminophen  (TYLENOL ) 500 MG tablet Take 1,000 mg by mouth every 6 (six) hours as needed for moderate pain or headache.    [provider]  albuterol  (VENTOLIN  HFA) 108 (90 Base) MCG/ACT inhaler Inhale 2 puffs into the lungs every 6 (six) hours as needed for wheezing or shortness of breath. 02/13/23   Tamea Dedra LITTIE, MD  ascorbic acid  (VITAMIN C) 1000 MG tablet Take 1,000 mg by mouth once a week.    [provider]  aspirin  EC (ASPIRIN  LOW DOSE) 81 MG tablet TAKE 1 TABLET (81 MG TOTAL) BY MOUTH DAILY. SWALLOW WHOLE. 02/13/24   Darliss Rogue, MD  atorvastatin  (LIPITOR ) 80 MG tablet Take 80 mg by mouth at bedtime.    [provider]  budeson-glycopyrrolate -formoterol  (BREZTRI  AEROSPHERE) 160-9-4.8 MCG/ACT AERO inhaler Inhale 2 puffs into the lungs in the morning and at bedtime. 11/06/23   Tamea Dedra LITTIE, MD  cephALEXin  (KEFLEX ) 250 MG capsule Take 1 capsule (250 mg total) by mouth daily. 12/24/23   Gaston Hamilton, MD  cholecalciferol  (VITAMIN D ) 25 MCG (1000 UNIT) tablet Take 1,000 Units by mouth at bedtime.    [provider]  Cinnamon 500 MG TABS Take 500 mg by mouth in the morning and at bedtime.    [provider]  citalopram  (CELEXA ) 20 MG tablet Take 20 mg by mouth every morning.    [provider]  dicyclomine (BENTYL) 10 MG capsule Take 10 mg by mouth 3 (three) times daily before meals. 03/20/24   [provider]  DULoxetine  (CYMBALTA ) 60 MG capsule Take 60 mg by mouth daily. 12/15/21   [provider]  feeding supplement (ENSURE ENLIVE / ENSURE PLUS) LIQD Take 237 mLs by mouth 2 (two) times daily between meals. 06/24/23   Marinda Jayson KIDD, MD  gabapentin  (NEURONTIN ) 300 MG capsule Take 3  capsules (900 mg total) by mouth 2 (two) times daily. 02/13/24 08/11/24  Marcelino Nurse, MD  HYDROcodone -acetaminophen  (NORCO) 7.5-325 MG tablet Take 1 tablet by mouth every 8 (eight) hours as needed for moderate pain (pain score 4-6). 05/21/24 06/20/24  Patel, Seema K, NP  HYDROcodone -acetaminophen  (NORCO) 7.5-325 MG tablet Take 1 tablet by mouth every 8 (eight) hours as needed for moderate pain (pain score 4-6). 06/20/24 07/20/24  Patel, Seema K, NP  HYDROcodone -acetaminophen  (NORCO) 7.5-325 MG tablet Take 1 tablet by mouth every 8 (eight) hours as needed for moderate pain (pain score 4-6). 07/20/24 08/19/24  Patel, Seema K, NP  hydrOXYzine  (ATARAX ) 25 MG tablet Take 1 tablet (25 mg total) by mouth at bedtime. 12/24/23   Gaston Hamilton, MD  metoprolol  tartrate (LOPRESSOR ) 25 MG tablet Take 1 tablet (25 mg total) by mouth 2 (two) times daily. 01/09/24   Darliss Rogue, MD   Multiple Vitamins-Minerals (HAIR SKIN NAILS PO) Take 1 tablet by mouth in the morning and at bedtime.    [provider]  naloxone [facility]) nasal spray 4 mg/0.1 mL Place 1 spray into the nose as needed for up to 365 doses (for opioid-induced respiratory depresssion). In case of emergency (overdose), spray once into each nostril. If no response within 3 minutes, repeat application and call 911. 05/12/24 05/12/25  Patel, Seema K, NP  nitroGLYCERIN  (NITROSTAT ) 0.4 MG SL tablet Place 1 tablet (0.4 mg total) under the tongue every 5 (five) minutes as needed for chest pain. 11/01/22 05/12/24  Vivienne Lonni Ingle, NP  omeprazole  (PRILOSEC) 40 MG capsule Take 40 mg by mouth. 04/24/24 07/23/24  [provider]  omeprazole -sodium bicarbonate (ZEGERID) 40-1100 MG capsule Take 1 capsule by mouth daily before breakfast.    [provider]  oxybutynin  (DITROPAN -XL) 10 MG 24 hr tablet Take 1 tablet (10 mg total) by mouth at bedtime. 12/24/23   Gaston Hamilton, MD  Pentosan Polysulfate Sodium  150 MG CPDR Take 1 capsule by mouth 2 (two) times daily. 12/24/23   Gaston Hamilton, MD  sulfaSALAzine  (AZULFIDINE ) 500 MG tablet Take 500 mg by mouth 2 (two) times daily.    [provider]  tiZANidine  (ZANAFLEX ) 4 MG tablet Take 1 tablet (4 mg total) by mouth every 12 (twelve) hours as needed. 05/12/24 11/08/24  Patel, Seema K, NP  TURMERIC CURCUMIN PO Take 1 capsule by mouth daily.    [provider]  valsartan (DIOVAN) 160 MG tablet Take 160 mg by mouth daily.    [provider]  vitamin B-12 (CYANOCOBALAMIN ) 1000 MCG tablet Take 1,000 mcg by mouth daily.    [provider]    Allergies as of 04/14/2024 - Review Complete 04/14/2024  Allergen Reaction Noted   Aspirin  Other (See Comments) 12/15/2020   Sulfa antibiotics Nausea And Vomiting 02/10/2014    Family History  Problem Relation Age of Onset   Diabetes Mother    Hypertension Mother     Hypertension Father    COPD Father    Heart disease Father    Breast cancer Neg Hx     Social History   Socioeconomic History   Marital status: Married    Spouse name: Burnstein,Terry L (Spouse)   Number of children: Not on file   Years of education: Not on file   Highest education level: Not on file  Occupational History   Not on file  Tobacco Use   Smoking status: Former    Current packs/day: 0.00    Average packs/day: 1 pack/day for 56.2  years (56.2 ttl pk-yrs)    Types: Cigarettes    Start date: 80    Quit date: 09/22/2022    Years since quitting: 1.6    Passive exposure: Past   Smokeless tobacco: Never   Tobacco comments:    Quit in March of 2024.  Vaping Use   Vaping status: Never Used  Substance and Sexual Activity   Alcohol use: Not Currently    Comment: Occassional,   Drug use: No   Sexual activity: Never  Other Topics Concern   Not on file  Social History Narrative   Not on file   Social Drivers of Health   Financial Resource Strain: Low Risk  (05/13/2024)   Received from [facility]   Overall Financial Resource Strain (CARDIA)    Difficulty of Paying Living Expenses: Not hard at all  Food Insecurity: No Food Insecurity (05/13/2024)   Received from [facility]   Hunger Vital Sign    Within the past 12 months, you worried that your food would run out before you got the money to buy more.: Never true    Within the past 12 months, the food you bought just didn't last and you didn't have money to get more.: Never true  Transportation Needs: No Transportation Needs (05/13/2024)   Received from [facility] - Transportation    In the past 12 months, has lack of transportation kept you from medical appointments or from getting medications?: No    Lack of Transportation (Non-Medical): No  Physical Activity: Not on file  Stress: Not on file  Social Connections: Not on file  Intimate Partner  Violence: Not At Risk (06/19/2023)   Humiliation, Afraid, Rape, and Kick questionnaire    Fear of Current or Ex-Partner: No    Emotionally Abused: No    Physically Abused: No    Sexually Abused: No    Review of Systems: See HPI, otherwise negative ROS  Physical Exam: BP (!) 156/82   Pulse (!) 54   Temp (!) 96.6 F (35.9 C) (Temporal)   Resp 18   Ht 5' 5 (1.651 m)   Wt 92.4 kg   SpO2 100%   BMI 33.88 kg/m  General:   Alert,  pleasant and cooperative in NAD Head:  Normocephalic and atraumatic. Neck:  Supple; no masses or thyromegaly. Lungs:  Clear throughout to auscultation.    Heart:  Regular rate and rhythm. Abdomen:  Soft, nontender and nondistended. Normal bowel sounds, without guarding, and without rebound.   Neurologic:  Alert and  oriented x4;  grossly normal neurologically.  Impression/Plan: Sahvanna D Villarin is here for an colonoscopy to be performed for ulcerative colitis  Risks, benefits, limitations, and alternatives regarding  colonoscopy have been reviewed with the patient.  Questions have been answered.  All parties agreeable.   Corinn Brooklyn, MD  05/15/2024, 10:40 AM

## 2024-05-15 NOTE — Op Note (Signed)
 [facility] Patient Name: Tammy Lamb Procedure Date: 05/15/2024 10:08 AM MRN: [id number] Account #: [id number] Date of Birth: 1950/08/27 Admit Type: Outpatient Age: 73 Room: [facility] ENDO ROOM 3 Gender: Female Note Status: Finalized Instrument Name: Colon Scope [id number] Procedure:             Colonoscopy Indications:           Follow-up of ulcerative colitis Providers:             Corinn Jess Brooklyn MD, MD Referring MD:          Lavenia Beaver, MD (Referring MD) Medicines:             General Anesthesia Complications:         No immediate complications. Estimated blood loss: None. Procedure:             Pre-Anesthesia Assessment:                        - Prior to the procedure, a History and Physical was                         performed, and patient medications and allergies were                         reviewed. The patient is competent. The risks and                         benefits of the procedure and the sedation options and                         risks were discussed with the patient. All questions                         were answered and informed consent was obtained.                         Patient identification and proposed procedure were                         verified by the physician, the nurse, the                         anesthesiologist, the anesthetist and the technician                         in the pre-procedure area in the procedure room in the                         endoscopy suite. Mental Status Examination: alert and                         oriented. Airway Examination: normal oropharyngeal                         airway and neck mobility. Respiratory Examination:                         clear to auscultation. CV Examination: normal.  Prophylactic Antibiotics: The patient does not require                         prophylactic antibiotics. Prior Anticoagulants: The                         patient  has taken no anticoagulant or antiplatelet                         agents. ASA Grade Assessment: III - A patient with                         severe systemic disease. After reviewing the risks and                         benefits, the patient was deemed in satisfactory                         condition to undergo the procedure. The anesthesia                         plan was to use general anesthesia. Immediately prior                         to administration of medications, the patient was                         re-assessed for adequacy to receive sedatives. The                         heart rate, respiratory rate, oxygen saturations,                         blood pressure, adequacy of pulmonary ventilation, and                         response to care were monitored throughout the                         procedure. The physical status of the patient was                         re-assessed after the procedure.                        After obtaining informed consent, the colonoscope was                         passed under direct vision. Throughout the procedure,                         the patient's blood pressure, pulse, and oxygen                         saturations were monitored continuously. The                         Colonoscope was introduced through the anus and  advanced to the the terminal ileum, with                         identification of the appendiceal orifice and IC                         valve. 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 Successful                         completion of the procedure was aided by withdrawing                         the scope and replacing with the pediatric colonoscope                         and applying abdominal pressure. The patient tolerated                         the  procedure fairly well. The quality of the bowel                         preparation was evaluated using the BBPS [facility] Bowel                         Preparation Scale) with scores of: Right Colon = 3,                         Transverse Colon = 3 and Left Colon = 3 (entire mucosa                         seen well with no residual staining, small fragments                         of stool or opaque liquid). The total BBPS score                         equals 9. The terminal ileum, ileocecal valve,                         appendiceal orifice, and rectum were photographed. Findings:      The perianal and digital rectal examinations were normal. Pertinent       negatives include normal sphincter tone and no palpable rectal lesions.      The terminal ileum appeared normal.      The colon (entire examined portion) appeared normal. Biopsies were taken       with a cold forceps for histology. Impression:            - The examined portion of the ileum was normal.                        -  The entire examined colon is normal. Biopsied. Recommendation:        - Discharge patient to home (with escort).                        - Resume previous diet today.                        - Continue present medications.                        - Await pathology results.                        - Return to my office at appointment to be scheduled. Procedure Code(s):     --- Professional ---                        [phone], Colonoscopy, flexible; with biopsy, single or                         multiple Diagnosis Code(s):     --- Professional ---                        K51.90, Ulcerative colitis, unspecified, without                         complications CPT copyright 2022 American Medical Association. All rights reserved. The codes documented in this report are preliminary and upon coder review may  be revised to meet current compliance requirements. Dr. Corinn Brooklyn Corinn Jess Brooklyn MD, MD 05/15/2024 10:37:32  AM This report has been signed electronically. Number of Addenda: 0 Note Initiated On: 05/15/2024 10:08 AM Scope Withdrawal Time: 0 hours 9 minutes 12 seconds  Total Procedure Duration: 0 hours 18 minutes 23 seconds  Estimated Blood Loss:  Estimated blood loss: none.      [facility]

## 2024-05-15 NOTE — Anesthesia Postprocedure Evaluation (Signed)
 Anesthesia Post Note  Patient: Tammy Lamb  Procedure(s) Performed: COLONOSCOPY  Patient location during evaluation: Endoscopy Anesthesia Type: General Level of consciousness: awake and alert Pain management: pain level controlled Vital Signs Assessment: post-procedure vital signs reviewed and stable Respiratory status: spontaneous breathing, nonlabored ventilation, respiratory function stable and patient connected to nasal cannula oxygen Cardiovascular status: blood pressure returned to baseline and stable Postop Assessment: no apparent nausea or vomiting Anesthetic complications: no   No notable events documented.   Last Vitals:  Vitals:   05/15/24 1049 05/15/24 1059  BP: 134/82 (!) 159/78  Pulse: (!) 57   Resp: 17   Temp:    SpO2: 95%     Last Pain:  Vitals:   05/15/24 1059  TempSrc:   PainSc: 0-No pain                 Debby Mines

## 2024-05-15 NOTE — Transfer of Care (Signed)
 Immediate Anesthesia Transfer of Care Note  Patient: Tammy Lamb  Procedure(s) Performed: COLONOSCOPY  Patient Location: Endoscopy Unit  Anesthesia Type:General  Level of Consciousness: sedated and responds to stimulation  Airway & Oxygen Therapy: Patient Spontanous Breathing, Patient connected to nasal cannula oxygen, and OPA in place  Post-op Assessment: Report given to RN and Post -op Vital signs reviewed and stable  Post vital signs: Reviewed and stable  Last Vitals:  Vitals Value Taken Time  BP 118/70 1039  Temp    Pulse 61 1036  Resp 13 1039  SpO2 97% 1039    Last Pain:  Vitals:   05/15/24 0947  TempSrc: Temporal  PainSc: 0-No pain         Complications: No notable events documented.

## 2024-05-15 NOTE — Anesthesia Preprocedure Evaluation (Signed)
 Anesthesia Evaluation  Patient identified by MRN, date of birth, ID band Patient awake    Reviewed: Allergy & Precautions, NPO status , Patient's Chart, lab work & pertinent test results  History of Anesthesia Complications Negative for: history of anesthetic complications  Airway Mallampati: III  TM Distance: <3 FB Neck ROM: full    Dental  (+) Chipped   Pulmonary shortness of breath and with exertion, COPD, Recent URI , Residual Cough, former smoker    + decreased breath sounds      Cardiovascular hypertension, (-) angina + CAD, + Peripheral Vascular Disease and +CHF  Normal cardiovascular exam     Neuro/Psych  Headaches PSYCHIATRIC DISORDERS      TIA Neuromuscular disease    GI/Hepatic Neg liver ROS, PUD,GERD  Controlled,,  Endo/Other  negative endocrine ROSdiabetes, Type 2    Renal/GU      Musculoskeletal   Abdominal   Peds  Hematology negative hematology ROS (+)   Anesthesia Other Findings Patient reports that they do not think that any food or pills are stuck in their throat at this time.  Hoarse voice at baseline PreOp  Past Medical History: No date: Anemia No date: Anxiety No date: Aortic atherosclerosis (HCC) No date: Arthritis No date: Bilateral carotid artery stenosis 10/05/2022: CAD (coronary artery disease), native coronary artery     Comment:  a.) cCTA 10/05/2022: Ca2+ = 601 (92nd %ile); LAD 383,               RCA 217; b.) LHC 10/18/2022: 85% pRCA ( 3.5 x 15 mm Onyx               [place] [employer]) No date: Cerebral microvascular disease No date: Chronic lower back pain No date: Colitis, ulcerative (HCC) No date: COPD (chronic obstructive pulmonary disease) (HCC) No date: DDD (degenerative disc disease), cervical     Comment:  a.) s/p ACDF C3-C4 04/09/2019 No date: Depression 10/19/2022: Diastolic dysfunction     Comment:  a.) TTE 10/19/2022: EF 60-65%, no RWMAs,  G1DD, normal                RVSF; b.) TTE 12/04/2022: EF >55%. no RWMAs, G1DD, mild               LAE, triv AR/MR/PR, mod TR, PASP 45 No date: Dyspnea No date: GERD (gastroesophageal reflux disease) No date: Hiatal hernia 02/2021: History of 2019 novel coronavirus disease (COVID-19) No date: HLD (hyperlipidemia) No date: Hypertension No date: Interstitial cystitis No date: Long-term use of aspirin  therapy No date: Lumbar disc disease     Comment:  a.) s/p L3-L5 laminectomy, decompression, and               microdiscectomy 05/30/2017; b.) s/p L4-S1 decompression               05/27/2021 No date: Motion sickness     Comment:  fair rides No date: On chronic clopidogrel  therapy No date: Paraesophageal hernia No date: Parkinson's disease [employer])     Comment:  suspected per dr Maree @ [facility] 04/18/2021: Pulmonary hypertension (HCC)     Comment:  a.) TTE 04/18/2021: PASP 42.9; b.) TTE 12/04/2022: PASP               45 No date: PVD (peripheral vascular disease) (HCC) No date: T2DM (type 2 diabetes mellitus) (HCC) 2015: TIA (transient ischemic attack)     Comment:  a.) no deficits; per pt her carvedilol  dose was too high  causing hypotension No date: Urinary frequency No date: Wears dentures     Comment:  partial upper and lower  Past Surgical History: 03/22/2016: ANKLE ARTHROSCOPY; Left     Comment:  Procedure: ANKLE ARTHROSCOPY DEBRODEMENT EXTENSIVE LEFT               FLEXAR TENDON REPAIR;  Surgeon: Eva Gay, DPM;                Location: [facility];  Service: Podiatry;                Laterality: Left;  WITH POPLITEAL 04/09/2019: ANTERIOR CERVICAL DECOMP/DISCECTOMY FUSION; N/A     Comment:  Procedure: ANTERIOR CERVICAL DECOMPRESSION/DISCECTOMY               FUSION 1 LEVEL C3-4;  Surgeon: Clois Fret, MD;                Location: [facility] ORS;  Service: Neurosurgery;  Laterality:               N/A; No date: BLADDER SUSPENSION No date: CHOLECYSTECTOMY No date:  COLONOSCOPY 10/18/2022: CORONARY STENT INTERVENTION; N/A     Comment:  Procedure: CORONARY STENT INTERVENTION;  Surgeon: Darron Deatrice LABOR, MD;  Location: [facility];                Service: Cardiovascular;  Laterality: N/A; No date: DIAGNOSTIC LAPAROSCOPY     Comment:  adhesions 06/06/2023: ESOPHAGOGASTRODUODENOSCOPY (EGD) WITH PROPOFOL ; N/A     Comment:  Procedure: ESOPHAGOGASTRODUODENOSCOPY (EGD) WITH               PROPOFOL ;  Surgeon: Tye Millet, DO;  Location: [facility]               ENDOSCOPY;  Service: General;  Laterality: N/A;  patient               to stay on plavix  and aspirin  due to recent stent               placement, Dr. Tye aware No date: HYSTERECTOMY ABDOMINAL WITH SALPINGO-OOPHORECTOMY 06/19/2023: INSERTION OF MESH     Comment:  Procedure: INSERTION OF MESH;  Surgeon: Jordis Laneta FALCON,               MD;  Location: [facility] OR[facility];  Service: General;;                paraesophageal hernia repair 02/13/2014: KNEE ARTHROSCOPY; Left     Comment:  Procedure: LEFT ARTHROSCOPY KNEE, PARTIAL MEDIAL               MENISECTOMY AND PLICA, CHONDROPLASTY OF PATELLA FEMORAL               JOINT;  Surgeon: Norleen LITTIE Gavel, MD;  Location: [place]              [facility];  Service: Orthopedics;  Laterality: Left; 10/18/2022: LEFT HEART CATH AND CORONARY ANGIOGRAPHY; N/A     Comment:  Procedure: LEFT HEART CATH AND CORONARY ANGIOGRAPHY;                Surgeon: Darron Deatrice LABOR, MD;  Location: [facility] IN[facility]               CV LAB;  Service: Cardiovascular;  Laterality: N/A; 05/30/2017: LUMBAR LAMINECTOMY/DECOMPRESSION MICRODISCECTOMY; N/A     Comment:  Procedure: LUMBAR LAMINECTOMY/DECOMPRESSION  MICRODISCECTOMY 3 LEVELS-L3-S1;  Surgeon: Clois Fret, MD;  Location: [facility];  Service: Neurosurgery;              Laterality: N/A; 05/27/2021: LUMBAR LAMINECTOMY/DECOMPRESSION MICRODISCECTOMY; N/A     Comment:  Procedure: L4-S1 DECOMPRESSION;   Surgeon: Clois Fret, MD;  Location: [facility];  Service: Neurosurgery;              Laterality: N/A; 03/22/2016: TENDON REPAIR; Left     Comment:  Procedure: QOZKJM TENDON REPAIR;  Surgeon: Eva Gay, DPM;  Location: [facility];  Service:               Podiatry;  Laterality: Left; No date: TONSILLECTOMY 06/19/2023: XI ROBOTIC ASSISTED PARAESOPHAGEAL HERNIA REPAIR; N/A     Comment:  Procedure: XI ROBOTIC ASSISTED PARAESOPHAGEAL HERNIA               REPAIR, RNFA to assist;  Surgeon: Jordis Laneta FALCON, MD;                Location: [facility];  Service: General;  Laterality: N/A;     Reproductive/Obstetrics negative OB ROS                              Anesthesia Physical Anesthesia Plan  ASA: 3  Anesthesia Plan: General   Post-op Pain Management: Minimal or no pain anticipated   Induction: Intravenous  PONV Risk Score and Plan: 2 and Propofol  infusion and TIVA  Airway Management Planned: Nasal Cannula  Additional Equipment: None  Intra-op Plan:   Post-operative Plan:   Informed Consent: I have reviewed the patients History and Physical, chart, labs and discussed the procedure including the risks, benefits and alternatives for the proposed anesthesia with the patient or authorized representative who has indicated his/her understanding and acceptance.     Dental advisory given  Plan Discussed with: CRNA and Surgeon  Anesthesia Plan Comments: (Discussed risks of anesthesia with patient, including possibility of difficulty with spontaneous ventilation under anesthesia necessitating airway intervention, PONV, and rare risks such as cardiac or respiratory or neurological events, and allergic reactions. Discussed the role of CRNA in patient's perioperative care. Patient understands.)        Anesthesia Quick Evaluation

## 2024-05-16 LAB — SURGICAL PATHOLOGY

## 2024-05-19 ENCOUNTER — Ambulatory Visit
Admission: RE | Admit: 2024-05-19 | Discharge: 2024-05-19 | Disposition: A | Discharge status: Home / Self Care | Source: Ambulatory Visit | Attending: Internal Medicine | Admitting: Internal Medicine

## 2024-05-19 DIAGNOSIS — Z1231 Encounter for screening mammogram for malignant neoplasm of breast: Secondary | ICD-10-CM | POA: Diagnosis present

## 2024-05-21 ENCOUNTER — Ambulatory Visit: Payer: Self-pay | Admitting: Gastroenterology

## 2024-06-10 ENCOUNTER — Ambulatory Visit
Admission: RE | Admit: 2024-06-10 | Discharge: 2024-06-10 | Disposition: A | Discharge status: Home / Self Care | Source: Ambulatory Visit | Attending: Acute Care | Admitting: Acute Care

## 2024-06-10 DIAGNOSIS — Z87891 Personal history of nicotine dependence: Secondary | ICD-10-CM | POA: Diagnosis present

## 2024-06-10 DIAGNOSIS — Z122 Encounter for screening for malignant neoplasm of respiratory organs: Secondary | ICD-10-CM | POA: Diagnosis present

## 2024-06-18 ENCOUNTER — Telehealth: Payer: Self-pay | Admitting: Acute Care

## 2024-06-18 NOTE — Telephone Encounter (Signed)
 Spoke with patient by phone to review results of LDCT.  Abnormal scan with growth of right lung nodule.  Provider requests office appointment for review of results and plan for follow up.  Patient has confirmed appointment for  06/30/24 arrival 11:15am with Lauraine Lites, NP to discuss findings in more detail.  Results/plan faxed to PCP

## 2024-06-18 NOTE — Telephone Encounter (Signed)
 I have reviewed the 4 B scan . Please schedule patient as a consult with either Me, Byrum or Dewald, whoever has the earliest availability. Thanks so much Please fax results to PCP and let them know plan of care.

## 2024-06-30 ENCOUNTER — Ambulatory Visit: Admitting: Pulmonary Disease

## 2024-06-30 ENCOUNTER — Ambulatory Visit: Admitting: Acute Care

## 2024-06-30 ENCOUNTER — Encounter: Payer: Self-pay | Admitting: Pulmonary Disease

## 2024-06-30 ENCOUNTER — Telehealth: Payer: Self-pay

## 2024-06-30 VITALS — BP 138/84 | HR 50 | Temp 98.1°F | Ht 65.0 in | Wt 208.8 lb

## 2024-06-30 DIAGNOSIS — J449 Chronic obstructive pulmonary disease, unspecified: Secondary | ICD-10-CM

## 2024-06-30 DIAGNOSIS — K449 Diaphragmatic hernia without obstruction or gangrene: Secondary | ICD-10-CM

## 2024-06-30 DIAGNOSIS — Z9889 Other specified postprocedural states: Secondary | ICD-10-CM

## 2024-06-30 DIAGNOSIS — R911 Solitary pulmonary nodule: Secondary | ICD-10-CM

## 2024-06-30 DIAGNOSIS — R0602 Shortness of breath: Secondary | ICD-10-CM

## 2024-06-30 MED ORDER — PREDNISONE 20 MG PO TABS: 20.0000 mg | ORAL_TABLET | Freq: Every day | ORAL | 0 refills | Status: AC

## 2024-06-30 NOTE — Telephone Encounter (Signed)
 Copied from CRM #[id number]. Topic: Clinical - Medication Question >> Jun 30, 2024  3:51 PM Essie A wrote: Reason for CRM: Patient called to find out the status of prednisone  that she said Dr. Tamea was suppose to send to the pharmacy after the office visit today.  Please let her know when this has been called in by calling [phone].  Thanks.

## 2024-06-30 NOTE — Patient Instructions (Signed)
 VISIT SUMMARY:  Today, we discussed your ongoing issues with COPD and the lung nodule in your right lung. You reported experiencing shortness of breath, especially when walking uphill and sometimes at night, as well as difficulty in coughing up phlegm. We also reviewed the recent imaging of your lung nodule, which has slightly increased in size.  YOUR PLAN:  -CHRONIC OBSTRUCTIVE PULMONARY DISEASE (COPD) WITH EMPHYSEMA: COPD with emphysema is a chronic lung condition that causes breathing difficulties and wheezing. To help manage your symptoms, you have been prescribed prednisone  for 5 days to reduce inflammation and improve phlegm clearance. You should also take Mucinex twice daily to help with expectoration and increase your water intake. We have ordered pulmonary function tests to reassess your lung function and referred you to Dr. Unk for further evaluation of your esophagus.  -RIGHT LUNG NODULE UNDER SURVEILLANCE: A lung nodule is a small growth in the lung. Your right lung nodule has slightly increased in size from 7.8 mm to 8.5 mm. Although a previous PET CT scan showed no activity, we have ordered another PET CT to check for any changes. If the scan shows activity, we will consider radiation therapy as a treatment option.  INSTRUCTIONS:  Please follow up with the pulmonary function tests as ordered and keep your appointment with Dr. Unk for further evaluation of your esophagus. Additionally, we will review the results of your PET CT scan to determine the next steps for your lung nodule.

## 2024-06-30 NOTE — Progress Notes (Unsigned)
 Subjective:    Patient ID: Tammy Lamb, female    DOB: April 18, 1951, 73 y.o.   MRN: [id number]  Patient Care Team: Sherial Bail, MD as PCP - General (Internal Medicine) Darliss Rogue, MD as PCP - Cardiology (Cardiology) Tamea Dedra CROME, MD as Consulting Physician (Pulmonary Disease)  Chief Complaint  Patient presents with  . COPD    Shortness of breath on exertion. Wheezing. Symptoms worse at night. Here to go over CT scan done on 06/10/24.     BACKGROUND/INTERVAL:  HPI   Review of Systems A 10 point review of systems was performed and it is as noted above otherwise negative.   Patient Active Problem List   Diagnosis Date Noted  . Medication management 05/12/2024  . 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 Acute on chronic diastolic CHF (congestive heart failure) (HCC) 06/22/2023  . COPD (chronic obstructive pulmonary disease) (HCC) 06/21/2023  . Acute hypoxic respiratory failure (HCC) 06/21/2023  . Elevated LFTs 06/21/2023  . Protein calorie malnutrition 06/21/2023  . Depression 06/21/2023  . Acute anemia 06/21/2023  . Hyponatremia 06/21/2023  . Hiatal hernia with gastroesophageal reflux 06/19/2023  . S/P repair of paraesophageal hernia 06/19/2023  . Primary osteoarthritis of right knee 03/27/2023  . Chronic pain of left knee 03/27/2023  . Influenza A 10/20/2022  . Coronavirus infection 10/20/2022  . Chest pain 10/19/2022  . Coronary artery disease involving native coronary artery of native heart with unstable angina pectoris (HCC) 10/19/2022  . Status post primary angioplasty with coronary stent 10/19/2022  . Unstable angina (HCC) 10/18/2022  . COPD with acute exacerbation (HCC) 10/18/2022  . AKI (acute kidney injury) 10/18/2022  . Ulcerative colitis (HCC) 10/18/2022  . Bilateral hip pain 12/26/2021  . Thoracic disc herniation 08/25/2021  . Radicular pain of thoracic region 08/25/2021  . Atherosclerotic peripheral vascular  disease with intermittent claudication 03/08/2021  . Benign essential HTN 03/08/2021  . Bilateral carotid artery stenosis 03/08/2021  . Hyperlipidemia, mixed 03/08/2021  . Bilateral occipital neuralgia 09/16/2020  . Chronic SI joint pain 09/16/2020  . 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  . High-tone pelvic floor dysfunction 08/21/2019  . Recurrent UTI 08/21/2019  . S/P cervical spinal fusion (C3-C4 ACDF (03/2019) 04/29/2019  . Cervicalgia 04/29/2019  . 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  . Cervical facet joint syndrome 10/01/2018  . DDD (degenerative disc disease), cervical 10/01/2018  . Chronic radicular cervical pain 10/01/2018  . Compression fracture of lumbar vertebra, non-traumatic, sequela 02/28/2018  . Lumbar post-laminectomy syndrome 02/28/2018  . Spinal stenosis, lumbar region, with neurogenic claudication 09/27/2017  . Lumbar radiculopathy 09/27/2017  . Lumbar degenerative disc disease 09/27/2017  . Lumbar facet arthropathy 09/27/2017  . Chronic pain syndrome 09/27/2017  . Neurogenic claudication 05/30/2017    Social History   Tobacco Use  . Smoking status: Former    Current packs/day: 0.00    Average packs/day: 1 pack/day for 56.2 years (56.2 ttl pk-yrs)    Types: Cigarettes    Start date: 29    Quit date: 09/22/2022    Years since quitting: 1.7    Passive exposure: Past  . Smokeless tobacco: Never  . Tobacco comments:    Quit in March of 2024.  Substance Use Topics  . Alcohol use: Not Currently    Comment: Occassional,    Allergies  Allergen Reactions  . Aspirin  Other (See Comments)    GERD  . Sulfa Antibiotics Nausea And Vomiting    Current Meds  Medication Sig  .  acetaminophen  (TYLENOL ) 500 MG tablet Take 1,000 mg by mouth every 6 (six) hours as needed for moderate pain or headache.  . albuterol  (VENTOLIN  HFA) 108 (90 Base) MCG/ACT inhaler Inhale 2 puffs into the lungs every 6 (six) hours as needed for wheezing or shortness of  breath.  . ascorbic acid  (VITAMIN C) 1000 MG tablet Take 1,000 mg by mouth once a week.  . aspirin  EC (ASPIRIN  LOW DOSE) 81 MG tablet TAKE 1 TABLET (81 MG TOTAL) BY MOUTH DAILY. SWALLOW WHOLE.  . atorvastatin  (LIPITOR ) 80 MG tablet Take 80 mg by mouth at bedtime.  . budeson-glycopyrrolate -formoterol  (BREZTRI  AEROSPHERE) 160-9-4.8 MCG/ACT AERO inhaler Inhale 2 puffs into the lungs in the morning and at bedtime.  . cephALEXin  (KEFLEX ) 250 MG capsule Take 1 capsule (250 mg total) by mouth daily.  . cholecalciferol  (VITAMIN D ) 25 MCG (1000 UNIT) tablet Take 1,000 Units by mouth at bedtime.  . Cinnamon 500 MG TABS Take 500 mg by mouth in the morning and at bedtime.  . citalopram  (CELEXA ) 20 MG tablet Take 20 mg by mouth every morning.  . dicyclomine (BENTYL) 10 MG capsule Take 10 mg by mouth 3 (three) times daily before meals.  . DULoxetine  (CYMBALTA ) 60 MG capsule Take 60 mg by mouth daily.  . feeding supplement (ENSURE ENLIVE / ENSURE PLUS) LIQD Take 237 mLs by mouth 2 (two) times daily between meals.  . gabapentin  (NEURONTIN ) 300 MG capsule Take 3 capsules (900 mg total) by mouth 2 (two) times daily.  . HYDROcodone -acetaminophen  (NORCO) 7.5-325 MG tablet Take 1 tablet by mouth every 8 (eight) hours as needed for moderate pain (pain score 4-6).  SABRA CASTLEMAN ON 07/20/2024] HYDROcodone -acetaminophen  (NORCO) 7.5-325 MG tablet Take 1 tablet by mouth every 8 (eight) hours as needed for moderate pain (pain score 4-6).  . hydrOXYzine  (ATARAX ) 25 MG tablet Take 1 tablet (25 mg total) by mouth at bedtime.  . metoprolol  tartrate (LOPRESSOR ) 25 MG tablet Take 1 tablet (25 mg total) by mouth 2 (two) times daily.  . Multiple Vitamins-Minerals (HAIR SKIN NAILS PO) Take 1 tablet by mouth in the morning and at bedtime.  . naloxone  (NARCAN ) nasal spray 4 mg/0.1 mL Place 1 spray into the nose as needed for up to 365 doses (for opioid-induced respiratory depresssion). In case of emergency (overdose), spray once into each  nostril. If no response within 3 minutes, repeat application and call 911.  . nitroGLYCERIN  (NITROSTAT ) 0.4 MG SL tablet Place 1 tablet (0.4 mg total) under the tongue every 5 (five) minutes as needed for chest pain.  . omeprazole  (PRILOSEC) 40 MG capsule Take 40 mg by mouth.  . omeprazole -sodium bicarbonate (ZEGERID) 40-1100 MG capsule Take 1 capsule by mouth daily before breakfast.  . oxybutynin  (DITROPAN -XL) 10 MG 24 hr tablet Take 1 tablet (10 mg total) by mouth at bedtime.  . Pentosan Polysulfate Sodium  150 MG CPDR Take 1 capsule by mouth 2 (two) times daily.  . sulfaSALAzine  (AZULFIDINE ) 500 MG tablet Take 500 mg by mouth 2 (two) times daily.  . tiZANidine  (ZANAFLEX ) 4 MG tablet Take 1 tablet (4 mg total) by mouth every 12 (twelve) hours as needed.  . TURMERIC CURCUMIN PO Take 1 capsule by mouth daily.  . valsartan (DIOVAN) 160 MG tablet Take 160 mg by mouth daily.  . vitamin B-12 (CYANOCOBALAMIN ) 1000 MCG tablet Take 1,000 mcg by mouth daily.    Immunization History  Administered Date(s) Administered  . Fluad Trivalent(High Dose 65+) 05/01/2023  . INFLUENZA, HIGH DOSE SEASONAL  PF 04/14/2024  . Influenza Inj Mdck Quad Pf 05/17/2021  . Janssen (J&J) SARS-COV-2 Vaccination 11/24/2019  . Moderna Sars-Covid-2 Vaccination 07/25/2020  . PNEUMOCOCCAL CONJUGATE-20 06/28/2022  . Tdap 05/17/2021        Objective:     Vitals:   06/30/24 1136  BP: 138/84  Pulse: (!) 50  Temp: 98.1 F (36.7 C)  Height: 5' 5 (1.651 m)  Weight: 208 lb 12.8 oz (94.7 kg)  SpO2: 94%  TempSrc: Temporal  BMI (Calculated): 34.75     SpO2: 94 %  GENERAL: HEAD: Normocephalic, atraumatic.  EYES: Pupils equal, round, reactive to light.  No scleral icterus.  MOUTH:  NECK: Supple. No thyromegaly. Trachea midline. No JVD.  No adenopathy. PULMONARY: Good air entry bilaterally.  No adventitious sounds. CARDIOVASCULAR: S1 and S2. Regular rate and rhythm.  ABDOMEN: MUSCULOSKELETAL: No joint deformity, no  clubbing, no edema.  NEUROLOGIC:  SKIN: Intact,warm,dry. PSYCH:        Assessment & Plan:   No diagnosis found.  No orders of the defined types were placed in this encounter.   No orders of the defined types were placed in this encounter.     Advised if symptoms do not improve or worsen, to please contact office for sooner follow up or seek emergency care.    I spent xxx minutes of dedicated to the care of this patient on the date of this encounter to include pre-visit review of records, face-to-face time with the patient discussing conditions above, post visit ordering of testing, clinical documentation with the electronic health record, making appropriate referrals as documented, and communicating necessary findings to members of the patients care team.     C. Leita Sanders, MD Advanced Bronchoscopy PCCM [facility]    *This note was generated using voice recognition software/Dragon and/or AI transcription program.  Despite best efforts to proofread, errors can occur which can change the meaning. Any transcriptional errors that result from this process are unintentional and may not be fully corrected at the time of dictation.

## 2024-06-30 NOTE — Telephone Encounter (Signed)
 I have notified the patient. Nothing further needed.

## 2024-07-04 ENCOUNTER — Encounter: Payer: Self-pay | Admitting: Gastroenterology

## 2024-07-04 ENCOUNTER — Encounter: Admission: RE | Disposition: A | Payer: Self-pay | Source: Home / Self Care | Attending: Gastroenterology

## 2024-07-04 ENCOUNTER — Ambulatory Visit

## 2024-07-04 ENCOUNTER — Ambulatory Visit
Admission: RE | Admit: 2024-07-04 | Discharge: 2024-07-04 | Disposition: A | Attending: Gastroenterology | Admitting: Gastroenterology

## 2024-07-04 DIAGNOSIS — K2100 Gastro-esophageal reflux disease with esophagitis, without bleeding: Secondary | ICD-10-CM | POA: Diagnosis present

## 2024-07-04 DIAGNOSIS — F419 Anxiety disorder, unspecified: Secondary | ICD-10-CM | POA: Insufficient documentation

## 2024-07-04 DIAGNOSIS — F32A Depression, unspecified: Secondary | ICD-10-CM | POA: Insufficient documentation

## 2024-07-04 DIAGNOSIS — I5032 Chronic diastolic (congestive) heart failure: Secondary | ICD-10-CM | POA: Diagnosis not present

## 2024-07-04 DIAGNOSIS — Z87891 Personal history of nicotine dependence: Secondary | ICD-10-CM | POA: Diagnosis not present

## 2024-07-04 DIAGNOSIS — J449 Chronic obstructive pulmonary disease, unspecified: Secondary | ICD-10-CM | POA: Insufficient documentation

## 2024-07-04 DIAGNOSIS — I110 Hypertensive heart disease with heart failure: Secondary | ICD-10-CM | POA: Insufficient documentation

## 2024-07-04 DIAGNOSIS — Z8711 Personal history of peptic ulcer disease: Secondary | ICD-10-CM | POA: Diagnosis not present

## 2024-07-04 DIAGNOSIS — K219 Gastro-esophageal reflux disease without esophagitis: Secondary | ICD-10-CM | POA: Diagnosis not present

## 2024-07-04 DIAGNOSIS — E1151 Type 2 diabetes mellitus with diabetic peripheral angiopathy without gangrene: Secondary | ICD-10-CM | POA: Insufficient documentation

## 2024-07-04 DIAGNOSIS — I2510 Atherosclerotic heart disease of native coronary artery without angina pectoris: Secondary | ICD-10-CM | POA: Insufficient documentation

## 2024-07-04 DIAGNOSIS — K449 Diaphragmatic hernia without obstruction or gangrene: Secondary | ICD-10-CM | POA: Diagnosis not present

## 2024-07-04 HISTORY — PX: ESOPHAGOGASTRODUODENOSCOPY: SHX5428

## 2024-07-04 SURGERY — EGD (ESOPHAGOGASTRODUODENOSCOPY)
Anesthesia: General

## 2024-07-04 MED ORDER — FENTANYL CITRATE (PF) 100 MCG/2ML IJ SOLN
INTRAMUSCULAR | Status: AC
  Filled 2024-07-04: qty 2

## 2024-07-04 MED ORDER — SODIUM CHLORIDE 0.9 % IV SOLN
INTRAVENOUS | Status: DC
  Administered 2024-07-04: 500 mL via INTRAVENOUS

## 2024-07-04 MED ORDER — PROPOFOL 10 MG/ML IV BOLUS
INTRAVENOUS | Status: DC | PRN
  Administered 2024-07-04: 30 mg via INTRAVENOUS

## 2024-07-04 MED ORDER — FENTANYL CITRATE (PF) 100 MCG/2ML IJ SOLN
INTRAMUSCULAR | Status: DC | PRN
  Administered 2024-07-04: 50 ug via INTRAVENOUS

## 2024-07-04 MED ORDER — SODIUM CHLORIDE 0.9 % IV SOLN: INTRAVENOUS | Status: DC | PRN

## 2024-07-04 MED ORDER — LIDOCAINE HCL (CARDIAC) PF 100 MG/5ML IV SOSY
PREFILLED_SYRINGE | INTRAVENOUS | Status: DC | PRN
  Administered 2024-07-04: 100 mg via INTRAVENOUS

## 2024-07-04 MED ORDER — EPHEDRINE 5 MG/ML INJ
INTRAVENOUS | Status: AC
  Filled 2024-07-04: qty 5

## 2024-07-04 MED ORDER — PROPOFOL 500 MG/50ML IV EMUL
INTRAVENOUS | Status: DC | PRN
  Administered 2024-07-04: 120 ug/kg/min via INTRAVENOUS

## 2024-07-04 NOTE — Anesthesia Preprocedure Evaluation (Signed)
 Anesthesia Evaluation  Patient identified by MRN, date of birth, ID band Patient awake    Reviewed: Allergy & Precautions, NPO status , Patient's Chart, lab work & pertinent test results  History of Anesthesia Complications Negative for: history of anesthetic complications  Airway Mallampati: III  TM Distance: <3 FB Neck ROM: full    Dental  (+) Chipped   Pulmonary shortness of breath and with exertion, COPD, Recent URI , Residual Cough, former smoker    + decreased breath sounds      Cardiovascular hypertension, (-) angina + CAD, + Peripheral Vascular Disease and +CHF  Normal cardiovascular exam     Neuro/Psych  Headaches PSYCHIATRIC DISORDERS Anxiety Depression    TIA Neuromuscular disease    GI/Hepatic Neg liver ROS, hiatal hernia, PUD,GERD  Controlled,,  Endo/Other  negative endocrine ROSdiabetes, Type 2    Renal/GU      Musculoskeletal   Abdominal   Peds  Hematology negative hematology ROS (+)   Anesthesia Other Findings Hoarse voice at baseline PreOp  Past Medical History: No date: Anemia No date: Anxiety No date: Aortic atherosclerosis (HCC) No date: Arthritis No date: Bilateral carotid artery stenosis 10/05/2022: CAD (coronary artery disease), native coronary artery     Comment:  a.) cCTA 10/05/2022: Ca2+ = 601 (92nd %ile); LAD 383,               RCA 217; b.) LHC 10/18/2022: 85% pRCA ( 3.5 x 15 mm Onyx               [place] DES) No date: Cerebral microvascular disease No date: Chronic lower back pain No date: Colitis, ulcerative (HCC) No date: COPD (chronic obstructive pulmonary disease) (HCC) No date: DDD (degenerative disc disease), cervical     Comment:  a.) s/p ACDF C3-C4 04/09/2019 No date: Depression 10/19/2022: Diastolic dysfunction     Comment:  a.) TTE 10/19/2022: EF 60-65%, no RWMAs,  G1DD, normal               RVSF; b.) TTE 12/04/2022: EF >55%. no RWMAs, G1DD, mild               LAE,  triv AR/MR/PR, mod TR, PASP 45 No date: Dyspnea No date: GERD (gastroesophageal reflux disease) No date: Hiatal hernia 02/2021: History of 2019 novel coronavirus disease (COVID-19) No date: HLD (hyperlipidemia) No date: Hypertension No date: Interstitial cystitis No date: Long-term use of aspirin  therapy No date: Lumbar disc disease     Comment:  a.) s/p L3-L5 laminectomy, decompression, and               microdiscectomy 05/30/2017; b.) s/p L4-S1 decompression               05/27/2021 No date: Motion sickness     Comment:  fair rides No date: On chronic clopidogrel  therapy No date: Paraesophageal hernia No date: Parkinson's disease [facility])     Comment:  suspected per dr Maree @ [facility] neuro 04/18/2021: Pulmonary hypertension (HCC)     Comment:  a.) TTE 04/18/2021: PASP 42.9; b.) TTE 12/04/2022: PASP               45 No date: PVD (peripheral vascular disease) (HCC) No date: T2DM (type 2 diabetes mellitus) (HCC) 2015: TIA (transient ischemic attack)     Comment:  a.) no deficits; per pt her carvedilol  dose was too high              causing hypotension No date: Urinary frequency No date:  Wears dentures     Comment:  partial upper and lower  Past Surgical History: 03/22/2016: ANKLE ARTHROSCOPY; Left     Comment:  Procedure: ANKLE ARTHROSCOPY DEBRODEMENT EXTENSIVE LEFT               FLEXAR TENDON REPAIR;  Surgeon: Eva Gay, DPM;                Location: [facility];  Service: Podiatry;                Laterality: Left;  WITH POPLITEAL 04/09/2019: ANTERIOR CERVICAL DECOMP/DISCECTOMY FUSION; N/A     Comment:  Procedure: ANTERIOR CERVICAL DECOMPRESSION/DISCECTOMY               FUSION 1 LEVEL C3-4;  Surgeon: Clois Fret, MD;                Location: [facility];  Service: Neurosurgery;  Laterality:               N/A; No date: BLADDER SUSPENSION No date: CHOLECYSTECTOMY No date: COLONOSCOPY 10/18/2022: CORONARY STENT INTERVENTION; N/A     Comment:  Procedure: CORONARY  STENT INTERVENTION;  Surgeon: Darron Deatrice LABOR, MD;  Location: [facility];                Service: Cardiovascular;  Laterality: N/A; No date: DIAGNOSTIC LAPAROSCOPY     Comment:  adhesions 06/06/2023: ESOPHAGOGASTRODUODENOSCOPY (EGD) WITH PROPOFOL ; N/A     Comment:  Procedure: ESOPHAGOGASTRODUODENOSCOPY (EGD) WITH               PROPOFOL ;  Surgeon: Tye Millet, DO;  Location: [facility]               ENDOSCOPY;  Service: General;  Laterality: N/A;  patient               to stay on plavix  and aspirin  due to recent stent               placement, Dr. Tye aware No date: HYSTERECTOMY ABDOMINAL WITH SALPINGO-OOPHORECTOMY 06/19/2023: INSERTION OF MESH     Comment:  Procedure: INSERTION OF MESH;  Surgeon: Jordis Laneta FALCON,               MD;  Location: [facility];  Service: General;;                paraesophageal hernia repair 02/13/2014: KNEE ARTHROSCOPY; Left     Comment:  Procedure: LEFT ARTHROSCOPY KNEE, PARTIAL MEDIAL               MENISECTOMY AND PLICA, CHONDROPLASTY OF PATELLA FEMORAL               JOINT;  Surgeon: Norleen LITTIE Gavel, MD;  Location: [facility];  Service: Orthopedics;  Laterality: Left; 10/18/2022: LEFT HEART CATH AND CORONARY ANGIOGRAPHY; N/A     Comment:  Procedure: LEFT HEART CATH AND CORONARY ANGIOGRAPHY;                Surgeon: Darron Deatrice LABOR, MD;  Location: [facility] IN[facility]               CV LAB;  Service: Cardiovascular;  Laterality: N/A; 05/30/2017: LUMBAR LAMINECTOMY/DECOMPRESSION MICRODISCECTOMY; N/A     Comment:  Procedure: LUMBAR LAMINECTOMY/DECOMPRESSION  MICRODISCECTOMY 3 LEVELS-L3-S1;  Surgeon: Clois Fret, MD;  Location: [facility];  Service: Neurosurgery;              Laterality: N/A; 05/27/2021: LUMBAR LAMINECTOMY/DECOMPRESSION MICRODISCECTOMY; N/A     Comment:  Procedure: L4-S1 DECOMPRESSION;  Surgeon: Clois Fret, MD;  Location: [facility];  Service:  Neurosurgery;              Laterality: N/A; 03/22/2016: TENDON REPAIR; Left     Comment:  Procedure: QOZKJM TENDON REPAIR;  Surgeon: Eva Gay, DPM;  Location: [facility];  Service:               Podiatry;  Laterality: Left; No date: TONSILLECTOMY 06/19/2023: XI ROBOTIC ASSISTED PARAESOPHAGEAL HERNIA REPAIR; N/A     Comment:  Procedure: XI ROBOTIC ASSISTED PARAESOPHAGEAL HERNIA               REPAIR, RNFA to assist;  Surgeon: Jordis Laneta FALCON, MD;                Location: [facility];  Service: General;  Laterality: N/A;     Reproductive/Obstetrics negative OB ROS                              Anesthesia Physical Anesthesia Plan  ASA: 3  Anesthesia Plan: General   Post-op Pain Management: Minimal or no pain anticipated   Induction: Intravenous  PONV Risk Score and Plan: 2 and Propofol  infusion and TIVA  Airway Management Planned: Nasal Cannula and Natural Airway  Additional Equipment: None  Intra-op Plan:   Post-operative Plan:   Informed Consent: I have reviewed the patients History and Physical, chart, labs and discussed the procedure including the risks, benefits and alternatives for the proposed anesthesia with the patient or authorized representative who has indicated his/her understanding and acceptance.     Dental advisory given  Plan Discussed with: CRNA and Surgeon  Anesthesia Plan Comments: (Discussed risks of anesthesia with patient, including possibility of difficulty with spontaneous ventilation under anesthesia necessitating airway intervention, PONV, and rare risks such as cardiac or respiratory or neurological events, and allergic reactions. Discussed the role of CRNA in patient's perioperative care. Patient understands.)         Anesthesia Quick Evaluation

## 2024-07-04 NOTE — H&P (Signed)
 Tammy JONELLE Brooklyn, MD [facility], DHIP [address]  [place], [place] [address]  Main: [phone] Fax:  [phone] Pager: [phone]   Primary Care Physician:  Sherial Bail, MD Primary Gastroenterologist:  Dr. Corinn JONELLE Lamb  Pre-Procedure History & Physical: HPI:  Tammy Lamb is a 73 y.o. female is here for an endoscopy.   Past Medical History:  Diagnosis Date   Anemia    Anxiety    Aortic atherosclerosis    Arthritis    Bilateral carotid artery stenosis    CAD (coronary artery disease), native coronary artery 10/05/2022   a.) cCTA 10/05/2022: Ca2+ = 601 (92nd %ile); LAD 383, RCA 217; b.) LHC 10/18/2022: 85% pRCA ( 3.5 x 15 mm Onyx [place] DES)   Cerebral microvascular disease    Chronic lower back pain    Colitis, ulcerative (HCC)    COPD (chronic obstructive pulmonary disease) (HCC)    DDD (degenerative disc disease), cervical    a.) s/p ACDF C3-C4 04/09/2019   Depression    Diastolic dysfunction 10/19/2022   a.) TTE 10/19/2022: EF 60-65%, no RWMAs,  G1DD, normal RVSF; b.) TTE 12/04/2022: EF >55%. no RWMAs, G1DD, mild LAE, triv AR/MR/PR, mod TR, PASP 45   Dyspnea    GERD (gastroesophageal reflux disease)    Hiatal hernia    History of 2019 novel coronavirus disease (COVID-19) 02/2021   HLD (hyperlipidemia)    Hypertension    Interstitial cystitis    Long-term use of aspirin  therapy    Lumbar disc disease    a.) s/p L3-L5 laminectomy, decompression, and microdiscectomy 05/30/2017; b.) s/p L4-S1 decompression 05/27/2021   Motion sickness    fair rides   On chronic clopidogrel  therapy    Paraesophageal hernia    Parkinson's disease (HCC)    suspected per dr Maree @ [facility] neuro   Pulmonary hypertension (HCC) 04/18/2021   a.) TTE 04/18/2021: PASP 42.9; b.) TTE 12/04/2022: PASP 45   PVD (peripheral vascular disease)    T2DM (type 2 diabetes mellitus) (HCC)    TIA (transient ischemic attack) 2015   a.) no deficits; per pt her  carvedilol  dose was too high causing hypotension   Urinary frequency    Wears dentures    partial upper and lower    Past Surgical History:  Procedure Laterality Date   ABDOMINAL HYSTERECTOMY     ANKLE ARTHROSCOPY Left 03/22/2016   Procedure: ANKLE ARTHROSCOPY DEBRODEMENT EXTENSIVE LEFT FLEXAR TENDON REPAIR;  Surgeon: Eva Gay, DPM;  Location: [facility];  Service: Podiatry;  Laterality: Left;  WITH POPLITEAL   ANTERIOR CERVICAL DECOMP/DISCECTOMY FUSION N/A 04/09/2019   Procedure: ANTERIOR CERVICAL DECOMPRESSION/DISCECTOMY FUSION 1 LEVEL C3-4;  Surgeon: Clois Fret, MD;  Location: [facility];  Service: Neurosurgery;  Laterality: N/A;   BACK SURGERY     BLADDER SUSPENSION     CHOLECYSTECTOMY     COLONOSCOPY     COLONOSCOPY N/A 05/15/2024   Procedure: COLONOSCOPY;  Surgeon: Lamb Tammy Skiff, MD;  Location: [facility];  Service: Endoscopy;  Laterality: N/A;   CORONARY ANGIOPLASTY     CORONARY STENT INTERVENTION N/A 10/18/2022   Procedure: CORONARY STENT INTERVENTION;  Surgeon: Darron Deatrice LABOR, MD;  Location: [facility];  Service: Cardiovascular;  Laterality: N/A;   DIAGNOSTIC LAPAROSCOPY     adhesions   ESOPHAGOGASTRODUODENOSCOPY N/A 04/11/2024   Procedure: EGD (ESOPHAGOGASTRODUODENOSCOPY);  Surgeon: Lamb Tammy Skiff, MD;  Location: [facility];  Service: Gastroenterology;  Laterality: N/A;   ESOPHAGOGASTRODUODENOSCOPY (EGD) WITH PROPOFOL   N/A 06/06/2023   Procedure: ESOPHAGOGASTRODUODENOSCOPY (EGD) WITH PROPOFOL ;  Surgeon: Tye Millet, DO;  Location: [facility];  Service: General;  Laterality: N/A;  patient to stay on plavix  and aspirin  due to recent stent placement, Dr. Tye aware   HERNIA REPAIR     HYSTERECTOMY ABDOMINAL WITH SALPINGO-OOPHORECTOMY     INSERTION OF MESH  06/19/2023   Procedure: INSERTION OF MESH;  Surgeon: Jordis Laneta FALCON, MD;  Location: [facility];  Service: General;;  paraesophageal hernia repair   KNEE ARTHROSCOPY Left  02/13/2014   Procedure: LEFT ARTHROSCOPY KNEE, PARTIAL MEDIAL MENISECTOMY AND PLICA, CHONDROPLASTY OF PATELLA FEMORAL JOINT;  Surgeon: Norleen LITTIE Gavel, MD;  Location: [facility];  Service: Orthopedics;  Laterality: Left;   LEFT HEART CATH AND CORONARY ANGIOGRAPHY N/A 10/18/2022   Procedure: LEFT HEART CATH AND CORONARY ANGIOGRAPHY;  Surgeon: Darron Deatrice LABOR, MD;  Location: [facility];  Service: Cardiovascular;  Laterality: N/A;   LUMBAR LAMINECTOMY/DECOMPRESSION MICRODISCECTOMY N/A 05/30/2017   Procedure: LUMBAR LAMINECTOMY/DECOMPRESSION MICRODISCECTOMY 3 LEVELS-L3-S1;  Surgeon: Clois Fret, MD;  Location: [facility];  Service: Neurosurgery;  Laterality: N/A;   LUMBAR LAMINECTOMY/DECOMPRESSION MICRODISCECTOMY N/A 05/27/2021   Procedure: L4-S1 DECOMPRESSION;  Surgeon: Clois Fret, MD;  Location: [facility];  Service: Neurosurgery;  Laterality: N/A;   TENDON REPAIR Left 03/22/2016   Procedure: QOZKJM TENDON REPAIR;  Surgeon: Eva Gay, DPM;  Location: [facility];  Service: Podiatry;  Laterality: Left;   TONSILLECTOMY     XI ROBOTIC ASSISTED PARAESOPHAGEAL HERNIA REPAIR N/A 06/19/2023   Procedure: XI ROBOTIC ASSISTED PARAESOPHAGEAL HERNIA REPAIR, RNFA to assist;  Surgeon: Jordis Laneta FALCON, MD;  Location: [facility];  Service: General;  Laterality: N/A;    Prior to Admission medications  Medication Sig Start Date End Date Taking? Authorizing Provider  acetaminophen  (TYLENOL ) 500 MG tablet Take 1,000 mg by mouth every 6 (six) hours as needed for moderate pain or headache.   Yes [provider]  albuterol  (VENTOLIN  HFA) 108 (90 Base) MCG/ACT inhaler Inhale 2 puffs into the lungs every 6 (six) hours as needed for wheezing or shortness of breath. 02/13/23  Yes Tamea Dedra LITTIE, MD  ascorbic acid  (VITAMIN C) 1000 MG tablet Take 1,000 mg by mouth once a week.   Yes [provider]  aspirin  EC (ASPIRIN  LOW DOSE) 81 MG tablet TAKE 1 TABLET (81 MG  TOTAL) BY MOUTH DAILY. SWALLOW WHOLE. 02/13/24  Yes Darliss Rogue, MD  atorvastatin  (LIPITOR ) 80 MG tablet Take 80 mg by mouth at bedtime.   Yes [provider]  Cinnamon 500 MG TABS Take 500 mg by mouth in the morning and at bedtime.   Yes [provider]  citalopram  (CELEXA ) 20 MG tablet Take 20 mg by mouth every morning.   Yes [provider]  dicyclomine (BENTYL) 10 MG capsule Take 10 mg by mouth 3 (three) times daily before meals. 03/20/24  Yes [provider]  DULoxetine  (CYMBALTA ) 60 MG capsule Take 60 mg by mouth daily. 12/15/21  Yes [provider]  feeding supplement (ENSURE ENLIVE / ENSURE PLUS) LIQD Take 237 mLs by mouth 2 (two) times daily between meals. 06/24/23  Yes Marinda Jayson KIDD, MD  HYDROcodone -acetaminophen  (NORCO) 7.5-325 MG tablet Take 1 tablet by mouth every 8 (eight) hours as needed for moderate pain (pain score 4-6). 06/20/24 07/20/24 Yes Patel, Seema K, NP  hydrOXYzine  (ATARAX ) 25 MG tablet Take 1 tablet (25 mg total) by mouth at bedtime. 12/24/23  Yes Gaston Hamilton, MD  metoprolol   tartrate (LOPRESSOR ) 25 MG tablet Take 1 tablet (25 mg total) by mouth 2 (two) times daily. 01/09/24  Yes Agbor-Etang, Redell, MD  Multiple Vitamins-Minerals (HAIR SKIN NAILS PO) Take 1 tablet by mouth in the morning and at bedtime.   Yes [provider]  omeprazole  (PRILOSEC) 40 MG capsule Take 40 mg by mouth. 04/24/24 07/23/24 Yes [provider]  omeprazole -sodium bicarbonate (ZEGERID) 40-1100 MG capsule Take 1 capsule by mouth daily before breakfast.   Yes [provider]  oxybutynin  (DITROPAN -XL) 10 MG 24 hr tablet Take 1 tablet (10 mg total) by mouth at bedtime. 12/24/23  Yes MacDiarmid, Glendia, MD  Pentosan Polysulfate Sodium  150 MG CPDR Take 1 capsule by mouth 2 (two) times daily. 12/24/23  Yes MacDiarmid, Glendia, MD  predniSONE  (DELTASONE ) 20 MG tablet Take 1 tablet (20 mg total) by mouth daily with breakfast for 5 days.  06/30/24 07/05/24 Yes Tamea Dedra CROME, MD  sulfaSALAzine  (AZULFIDINE ) 500 MG tablet Take 500 mg by mouth 2 (two) times daily.   Yes [provider]  tiZANidine  (ZANAFLEX ) 4 MG tablet Take 1 tablet (4 mg total) by mouth every 12 (twelve) hours as needed. 05/12/24 11/08/24 Yes Patel, Seema K, NP  TURMERIC CURCUMIN PO Take 1 capsule by mouth daily.   Yes [provider]  valsartan (DIOVAN) 160 MG tablet Take 160 mg by mouth daily.   Yes [provider]  vitamin B-12 (CYANOCOBALAMIN ) 1000 MCG tablet Take 1,000 mcg by mouth daily.   Yes [provider]  budeson-glycopyrrolate -formoterol  (BREZTRI  AEROSPHERE) 160-9-4.8 MCG/ACT AERO inhaler Inhale 2 puffs into the lungs in the morning and at bedtime. 11/06/23   Tamea Dedra CROME, MD  cephALEXin  (KEFLEX ) 250 MG capsule Take 1 capsule (250 mg total) by mouth daily. Patient not taking: Reported on 07/04/2024 12/24/23   Gaston Glendia, MD  cholecalciferol  (VITAMIN D ) 25 MCG (1000 UNIT) tablet Take 1,000 Units by mouth at bedtime. Patient not taking: Reported on 07/04/2024    [provider]  gabapentin  (NEURONTIN ) 300 MG capsule Take 3 capsules (900 mg total) by mouth 2 (two) times daily. 02/13/24 08/11/24  Marcelino Nurse, MD  HYDROcodone -acetaminophen  (NORCO) 7.5-325 MG tablet Take 1 tablet by mouth every 8 (eight) hours as needed for moderate pain (pain score 4-6). 07/20/24 08/19/24  Patel, Seema K, NP  naloxone  (NARCAN ) nasal spray 4 mg/0.1 mL Place 1 spray into the nose as needed for up to 365 doses (for opioid-induced respiratory depresssion). In case of emergency (overdose), spray once into each nostril. If no response within 3 minutes, repeat application and call 911. 05/12/24 05/12/25  Patel, Seema K, NP  nitroGLYCERIN  (NITROSTAT ) 0.4 MG SL tablet Place 1 tablet (0.4 mg total) under the tongue every 5 (five) minutes as needed for chest pain. 11/01/22 06/30/24  Vivienne Lonni Ingle, NP    Allergies as of  05/29/2024 - Review Complete 05/15/2024  Allergen Reaction Noted   Aspirin  Other (See Comments) 12/15/2020   Sulfa antibiotics Nausea And Vomiting 02/10/2014    Family History  Problem Relation Age of Onset   Diabetes Mother    Hypertension Mother    Hypertension Father    COPD Father    Heart disease Father    Breast cancer Neg Hx     Social History   Socioeconomic History   Marital status: Married    Spouse name: Akers,Terry L (Spouse)   Number of children: Not on file   Years of education: Not on file   Highest education level: Not  on file  Occupational History   Not on file  Tobacco Use   Smoking status: Former    Current packs/day: 0.00    Average packs/day: 1 pack/day for 56.2 years (56.2 ttl pk-yrs)    Types: Cigarettes    Start date: 85    Quit date: 09/22/2022    Years since quitting: 1.7    Passive exposure: Past   Smokeless tobacco: Never   Tobacco comments:    Quit in March of 2024.  Vaping Use   Vaping status: Never Used  Substance and Sexual Activity   Alcohol use: Not Currently    Comment: Occassional,   Drug use: No   Sexual activity: Never  Other Topics Concern   Not on file  Social History Narrative   Not on file   Social Drivers of Health   Tobacco Use: Medium Risk (07/04/2024)   Patient History    Smoking Tobacco Use: Former    Smokeless Tobacco Use: Never    Passive Exposure: Past  Physicist, Medical Strain: Low Risk  (05/29/2024)   Received from [facility]   Overall Financial Resource Strain (CARDIA)    Difficulty of Paying Living Expenses: Not hard at all  Food Insecurity: No Food Insecurity (05/29/2024)   Received from [facility]   Epic    Within the past 12 months, you worried that your food would run out before you got the money to buy more.: Never true    Within the past 12 months, the food you bought just didn't last and you didn't have money to get more.: Never true  Transportation  Needs: No Transportation Needs (05/29/2024)   Received from [facility] - Transportation    In the past 12 months, has lack of transportation kept you from medical appointments or from getting medications?: No    Lack of Transportation (Non-Medical): No  Physical Activity: Not on file  Stress: Not on file  Social Connections: Not on file  Intimate Partner Violence: Not At Risk (06/19/2023)   Humiliation, Afraid, Rape, and Kick questionnaire    Fear of Current or Ex-Partner: No    Emotionally Abused: No    Physically Abused: No    Sexually Abused: No  Depression (PHQ2-9): Low Risk (05/12/2024)   Depression (PHQ2-9)    PHQ-2 Score: 0  Alcohol Screen: Not on file  Housing: Low Risk  (05/29/2024)   Received from [facility]   Epic    In the last 12 months, was there a time when you were not able to pay the mortgage or rent on time?: No    In the past 12 months, how many times have you moved where you were living?: 0    At any time in the past 12 months, were you homeless or living in a shelter (including now)?: No  Utilities: Not At Risk (05/29/2024)   Received from [facility]   Epic    In the past 12 months has the electric, gas, oil, or water company threatened to shut off services in your home?: No  Health Literacy: Not on file    Review of Systems: See HPI, otherwise negative ROS  Physical Exam: BP (!) 177/73   Pulse (!) 49   Temp (!) 96.8 F (36 C) (Temporal)   Resp 18   Ht 5' 5 (1.651 m)   Wt 94.3 kg   SpO2 100%   BMI 34.61 kg/m  General:   Alert,  pleasant and cooperative in NAD Head:  Normocephalic and atraumatic. Neck:  Supple; no masses or thyromegaly. Lungs:  Clear throughout to auscultation.    Heart:  Regular rate and rhythm. Abdomen:  Soft, nontender and nondistended. Normal bowel sounds, without guarding, and without rebound.   Neurologic:  Alert and  oriented x4;  grossly normal  neurologically.  Impression/Plan: Tammy Lamb is here for an endoscopy to be performed for follow up of erosive esophagitis  Risks, benefits, limitations, and alternatives regarding  endoscopy have been reviewed with the patient.  Questions have been answered.  All parties agreeable.   Tammy Brooklyn, MD  07/04/2024, 10:44 AM

## 2024-07-04 NOTE — Op Note (Signed)
 [facility] Patient Name: Tammy Lamb Procedure Date: 07/04/2024 11:30 AM MRN: [id number] Account #: [id number] Date of Birth: 08-Mar-1951 Admit Type: Outpatient Age: 73 Room: [facility] ENDO ROOM 4 Gender: Female Note Status: Finalized Instrument Name: Endoscope [id number] Procedure:             Upper GI endoscopy Indications:           Follow-up of esophagitis Providers:             Corinn Jess Brooklyn MD, MD Referring MD:          Lavenia Beaver, MD (Referring MD) Medicines:             General Anesthesia Complications:         No immediate complications. Estimated blood loss: None. Procedure:             Pre-Anesthesia Assessment:                        - Prior to the procedure, a History and Physical was                         performed, and patient medications and allergies were                         reviewed. The patient is competent. The risks and                         benefits of the procedure and the sedation options and                         risks were discussed with the patient. All questions                         were answered and informed consent was obtained.                         Patient identification and proposed procedure were                         verified by the physician, the nurse, the                         anesthesiologist, the anesthetist and the technician                         in the pre-procedure area in the procedure room in the                         endoscopy suite. Mental Status Examination: alert and                         oriented. Airway Examination: normal oropharyngeal                         airway and neck mobility. Respiratory Examination:                         clear to auscultation. CV Examination: normal.  Prophylactic Antibiotics: The patient does not require                         prophylactic antibiotics. Prior Anticoagulants: The                         patient has  taken no anticoagulant or antiplatelet                         agents. ASA Grade Assessment: III - A patient with                         severe systemic disease. After reviewing the risks and                         benefits, the patient was deemed in satisfactory                         condition to undergo the procedure. The anesthesia                         plan was to use general anesthesia. Immediately prior                         to administration of medications, the patient was                         re-assessed for adequacy to receive sedatives. The                         heart rate, respiratory rate, oxygen saturations,                         blood pressure, adequacy of pulmonary ventilation, and                         response to care were monitored throughout the                         procedure. The physical status of the patient was                         re-assessed after the procedure.                        After obtaining informed consent, the endoscope was                         passed under direct vision. Throughout the procedure,                         the patient's blood pressure, pulse, and oxygen                         saturations were monitored continuously. The Endoscope                         was introduced through the mouth, and advanced to the  second part of duodenum. The upper GI endoscopy was                         accomplished without difficulty. The patient tolerated                         the procedure well. Findings:      The duodenal bulb and second portion of the duodenum were normal.       Biopsies for histology were taken with a cold forceps for evaluation of       celiac disease.      A medium-sized hiatal hernia was present.      The gastroesophageal junction and examined esophagus were normal. Impression:            - Normal duodenal bulb and second portion of the                         duodenum. Biopsied.                         - Medium-sized hiatal hernia.                        - Normal gastroesophageal junction and esophagus. Recommendation:        - Discharge patient to home (with escort).                        - Resume previous diet today.                        - Continue present medications.                        - Await pathology results. Procedure Code(s):     --- Professional ---                        [phone], Esophagogastroduodenoscopy, flexible,                         transoral; with biopsy, single or multiple Diagnosis Code(s):     --- Professional ---                        K44.9, Diaphragmatic hernia without obstruction or                         gangrene                        K20.90, Esophagitis, unspecified without bleeding CPT copyright 2022 [employer]. All rights reserved. The codes documented in this report are preliminary and upon coder review may  be revised to meet current compliance requirements. Dr. Corinn Brooklyn Corinn Jess Brooklyn MD, MD 07/04/2024 11:46:21 AM This report has been signed electronically. Number of Addenda: 0 Note Initiated On: 07/04/2024 11:30 AM Estimated Blood Loss:  Estimated blood loss: none.      [facility]

## 2024-07-04 NOTE — Transfer of Care (Signed)
 Immediate Anesthesia Transfer of Care Note  Patient: Tammy Lamb  Procedure(s) Performed: EGD (ESOPHAGOGASTRODUODENOSCOPY)  Patient Location: PACU  Anesthesia Type:General  Level of Consciousness: awake, alert , oriented, and patient cooperative  Airway & Oxygen Therapy: Patient Spontanous Breathing  Post-op Assessment: Report given to RN and Post -op Vital signs reviewed and stable  Post vital signs: Reviewed and stable  Last Vitals:  Vitals Value Taken Time  BP 134/70 07/04/24 11:48  Temp    Pulse 48 07/04/24 11:48  Resp 15 07/04/24 11:48  SpO2 96 % 07/04/24 11:48  Vitals shown include unfiled device data.  Last Pain:  Vitals:   07/04/24 1148  TempSrc:   PainSc: 0-No pain         Complications: No notable events documented.

## 2024-07-07 ENCOUNTER — Ambulatory Visit: Admission: RE | Admit: 2024-07-07 | Discharge: 2024-07-07 | Attending: Pulmonary Disease | Admitting: Pulmonary Disease

## 2024-07-07 DIAGNOSIS — R911 Solitary pulmonary nodule: Secondary | ICD-10-CM

## 2024-07-07 LAB — SURGICAL PATHOLOGY

## 2024-07-09 ENCOUNTER — Ambulatory Visit: Payer: Self-pay | Admitting: Gastroenterology

## 2024-07-12 NOTE — Anesthesia Postprocedure Evaluation (Signed)
"   Anesthesia Post Note  Patient: Tammy Lamb  Procedure(s) Performed: EGD (ESOPHAGOGASTRODUODENOSCOPY)  Patient location during evaluation: Endoscopy Anesthesia Type: General Level of consciousness: awake and alert Pain management: pain level controlled Vital Signs Assessment: post-procedure vital signs reviewed and stable Respiratory status: spontaneous breathing, nonlabored ventilation, respiratory function stable and patient connected to nasal cannula oxygen Cardiovascular status: blood pressure returned to baseline and stable Postop Assessment: no apparent nausea or vomiting Anesthetic complications: no   No notable events documented.   Last Vitals:  Vitals:   07/04/24 1148 07/04/24 1158  BP: 134/70 (!) 177/84  Pulse: (!) 48 (!) 46  Resp: 13   Temp:    SpO2: 96% 96%    Last Pain:  Vitals:   07/04/24 1158  TempSrc:   PainSc: 0-No pain                 Prentice Murphy      "

## 2024-07-15 ENCOUNTER — Ambulatory Visit: Payer: Self-pay | Admitting: Pulmonary Disease

## 2024-07-15 ENCOUNTER — Ambulatory Visit
Admission: RE | Admit: 2024-07-15 | Discharge: 2024-07-15 | Disposition: A | Discharge status: Home / Self Care | Source: Ambulatory Visit | Attending: Pulmonary Disease | Admitting: Pulmonary Disease

## 2024-07-15 DIAGNOSIS — I2510 Atherosclerotic heart disease of native coronary artery without angina pectoris: Secondary | ICD-10-CM | POA: Diagnosis not present

## 2024-07-15 DIAGNOSIS — J439 Emphysema, unspecified: Secondary | ICD-10-CM | POA: Insufficient documentation

## 2024-07-15 DIAGNOSIS — I70219 Atherosclerosis of native arteries of extremities with intermittent claudication, unspecified extremity: Secondary | ICD-10-CM

## 2024-07-15 DIAGNOSIS — R911 Solitary pulmonary nodule: Secondary | ICD-10-CM | POA: Diagnosis present

## 2024-07-15 DIAGNOSIS — I700 Atherosclerosis of aorta: Secondary | ICD-10-CM | POA: Insufficient documentation

## 2024-07-15 LAB — GLUCOSE, CAPILLARY: Glucose-Capillary: 101 mg/dL — ABNORMAL HIGH (ref 70–99)

## 2024-07-15 MED ORDER — FLUDEOXYGLUCOSE F - 18 (FDG) INJECTION
10.8000 | Freq: Once | INTRAVENOUS | Status: AC | PRN
  Administered 2024-07-15: 11.74 via INTRAVENOUS

## 2024-07-22 ENCOUNTER — Telehealth: Payer: Self-pay | Admitting: Cardiology

## 2024-07-22 NOTE — Telephone Encounter (Signed)
 Called patient to go over GI dr request. Patient states she has been feeling fine. After looking through past office visits with us  and other providers, patient tends to run in the upper 40's-50's heart rate all the time so it is not abnormal for patient and she is not having any symptoms. Her blood pressure was also normal. Patient has recall to be scheduled for yearly follow up in march with Dr Darliss, she will call if she feels she needs to be seen sooner but otherwise will wait.   No further needs at this time.

## 2024-07-22 NOTE — Telephone Encounter (Signed)
 Pt Gastrologist told her to be seen due to her heart rate being slow. Pt did not know heart rate, no other symptoms besides being sleepy. Please advise.

## 2024-07-28 ENCOUNTER — Ambulatory Visit: Payer: Self-pay | Admitting: Pulmonary Disease

## 2024-07-28 ENCOUNTER — Telehealth: Payer: Self-pay

## 2024-07-28 MED ORDER — BREZTRI AEROSPHERE 160-9-4.8 MCG/ACT IN AERO: 2.0000 | INHALATION_SPRAY | Freq: Two times a day (BID) | RESPIRATORY_TRACT | 3 refills | Status: AC

## 2024-07-28 NOTE — Telephone Encounter (Signed)
 Request for Breztri . NFN.

## 2024-07-29 NOTE — Progress Notes (Signed)
 Please see the progress note on 30 June 2024 where the necessity of this test is specifically stated.  Patient has a GROWING lung nodule that requires further characterization to determine next steps of management.  Patient is a high risk for lung cancer.  KYM Leita Sanders, MD Advanced Bronchoscopy [facility]

## 2024-07-30 NOTE — Progress Notes (Signed)
Pt rescheduled to 1/30

## 2024-07-30 NOTE — Progress Notes (Signed)
 Disregard last note -- made in error.

## 2024-07-31 NOTE — Progress Notes (Unsigned)
 PROVIDER NOTE: Interpretation of information contained herein should be left to medically-trained personnel. Specific patient instructions are provided elsewhere under Patient Instructions section of medical record. This document was created in part using AI and STT-dictation technology, any transcriptional errors that may result from this process are unintentional.  Patient: Tammy Lamb  Service: E/M   PCP: Tammy Duwaine SQUIBB, DO  DOB: 12-09-1957  DOS: 08/04/2024  Provider: Emmy MARLA Blanch, NP  MRN: [id number]  Delivery: Face-to-face  Specialty: Interventional Pain Management  Type: Established Patient  Setting: Ambulatory outpatient facility  Specialty designation: 09  Referring Prov.: Tammy Duwaine SQUIBB, DO  Location: Outpatient office facility       History of present illness (HPI) Tammy Lamb, a 74 y.o. year old female, is here today because of her No primary diagnosis found.. Tammy Lamb primary complain today is No chief complaint on file.  Pertinent problems: Tammy Lamb has Major depression; Mild cognitive impairment; Essential hypertension, benign; Long term current use of opiate analgesic; Long term prescription opiate use; Opiate use (22.5 MME/Day); Neurogenic pain; Neuropathic pain; Visceral pain; Vaginal pain (2ry area of Pain); Chronic pelvic pain in female (1ry area of Pain); Encounter for therapeutic drug level monitoring; Encounter for chronic pain management; Fibromyalgia; Chronic pain syndrome; Pharmacologic therapy; Disorder of skeletal system; Problems influencing health status; Uncomplicated opioid dependence (HCC); Chronic use of opiate for therapeutic purpose; Chronic low back pain (Midline) w/o sciatica; and Chronic shoulder pain (Right) on their pertinent problem list.  Pain Assessment: Severity of   is reported as a  /10. Location:    / . Onset:  . Quality:  . Timing:  . Modifying factor(s):  SABRA Vitals:  vitals were not taken for this visit.  BMI: Estimated body mass  index is 29.52 kg/m as calculated from the following:   Height as of 07/28/24: 5' 2 (1.575 m).   Weight as of 07/28/24: 161 lb 6.4 oz (73.2 kg).  Last encounter: 05/08/2024. Last procedure: Visit date not found.  Reason for encounter:  *** .   Discussed the use of AI scribe software for clinical note transcription with the patient, who gave verbal consent to proceed.  History of Present Illness           Pharmacotherapy Assessment   Oxycodone -acetaminophen  (Percocet) 7.5-325 mg tablet 2 times daily as needed for severe pain. MME=22.50  Monitoring: [place] PMP: PDMP reviewed during this encounter.       Pharmacotherapy: No side-effects or adverse reactions reported. Compliance: No problems identified. Effectiveness: Clinically acceptable.  Tammy Lamb, [place]  05/08/2024  9:15 AM  Sign when Signing Visit Nursing Pain Medication Assessment:  Safety precautions to be maintained throughout the outpatient stay will include: orient to surroundings, keep bed in low position, maintain call bell within reach at all times, provide assistance with transfer out of bed and ambulation.  Medication Inspection Compliance: Pill count conducted under aseptic conditions, in front of the patient. Neither the pills nor the bottle was removed from the patient's sight at any time. Once count was completed pills were immediately returned to the patient in their original bottle.  Medication: Oxycodone /APAP Pill/Patch Count: 0 of 60 pills/patches remain Pill/Patch Appearance: Markings consistent with prescribed medication Bottle Appearance: Standard pharmacy container. Clearly labeled. Filled Date: 09 / 23 / 2025 Last Medication intake:  Ran out of medicine more than 48 hours ago    UDS:  Summary  Date Value Ref Range Status  11/14/2023 FINAL  Final  Comment:    ==================================================================== [employer] Select 13  (MW) ==================================================================== Specimen Alert Not Detected result may be consistent with the time of last use noted for this medication. AS NEEDED (Oxycodone ) ==================================================================== Test                             Result       Flag       Units  Drug Absent but Declared for Prescription Verification   Oxycodone                       Not Detected UNEXPECTED ng/mg creat ==================================================================== Test                      Result    Flag   Units      Ref Range   Creatinine              43               mg/dL      >=79 ==================================================================== Declared Medications:  The flagging and interpretation on this report are based on the  following declared medications.  Unexpected results may arise from  inaccuracies in the declared medications.   **Note: The testing scope of this panel includes these medications:   Oxycodone  (Percocet)   **Note: The testing scope of this panel does not include the  following reported medications:   Acetaminophen  (Percocet)  Amitriptyline  (Elavil )  Budesonide  (Symbicort )  Doxycycline   Fluticasone (Flonase)  Formoterol  (Symbicort )  Hydrocortisone   Ibuprofen (Advil)  Ivermectin   Levothyroxine  (Synthroid )  Lisinopril  (Zestril )  Loperamide (Imodium)  Multivitamin  Naloxone  (Narcan )  Venlafaxine  (Effexor ) ==================================================================== For clinical consultation, please call [phone]. ====================================================================     No results found for: CBDTHCR No results found for: D8THCCBX No results found for: D9THCCBX  ROS  Constitutional: Denies any fever or chills Gastrointestinal: No reported hemesis, hematochezia, vomiting, or acute GI distress Musculoskeletal: Bilateral perineal  pain Neurological: No reported episodes of acute onset apraxia, aphasia, dysarthria, agnosia, amnesia, paralysis, loss of coordination, or loss of consciousness  Medication Review  Ivermectin , Threonine, amitriptyline , budesonide -formoterol , fluticasone, hydrocortisone , ibuprofen, levothyroxine , lisinopril , loperamide, multivitamin, naloxone , oxyCODONE -acetaminophen , and venlafaxine  XR  History Review  Allergy: Tammy Lamb has no known allergies. Drug: Tammy Lamb  reports no history of drug use. Alcohol:  reports current alcohol use. Tobacco:  reports that she has never smoked. She has never used smokeless tobacco. Social: Tammy Lamb  reports that she has never smoked. She has never used smokeless tobacco. She reports current alcohol use. She reports that she does not use drugs. Medical:  has a past medical history of Actinic keratosis, Back pain, Basal cell carcinoma (07/14/2010), Bowel trouble (2003), Breast screening, unspecified, Bronchitis, COLD (chronic obstructive lung disease) (HCC), COVID-19, Diffuse cystic mastopathy, Family history of malignant neoplasm of breast, Hypothyroidism, Lump or mass in breast, and Unspecified essential hypertension (2013). Surgical: Ms. Bowron  has a past surgical history that includes Ganglion cyst excision; Cesarean section (1995); Breast mass excision (Right, 2013); Rectal surgery (2002); Upper gi endoscopy (2003); Breast cyst excision (Right, 2013); Breast biopsy (Left, 08/16/2023); Breast biopsy (Left, 08/16/2023); and Colonoscopy with propofol  (N/A, 09/12/2023). Family: family history includes Breast cancer (age of onset: 58) in her mother; Depression in her daughter; Diabetes in her father and mother; Heart disease in her father and mother; Hypertension in her father and mother.  Laboratory Chemistry Profile   Renal  Lab Results  Component Value Date   BUN 11 01/22/2024   CREATININE 0.67 01/22/2024   LABCREA 67.9 04/09/2015   BCR 16 01/22/2024   GFRAA 99  05/24/2020   GFRNONAA 86 05/24/2020    Hepatic Lab Results  Component Value Date   AST 14 07/13/2023   ALT 16 07/13/2023   ALBUMIN 4.4 07/13/2023   ALKPHOS 82 07/13/2023    Electrolytes Lab Results  Component Value Date   NA 139 01/22/2024   K 4.2 01/22/2024   CL 101 01/22/2024   CALCIUM 9.2 01/22/2024   MG 2.1 08/30/2015    Bone Lab Results  Component Value Date   VD25OH 35.4 06/13/2016   VD125OH2TOT 52.3 06/30/2015    Inflammation (CRP: Acute Phase) (ESR: Chronic Phase) Lab Results  Component Value Date   CRP 1.1 (H) 08/30/2015   ESRSEDRATE 2 01/03/2017         Note: Above Lab results reviewed.  Recent Imaging Review  US  RT LOWER EXTREM LTD SOFT TISSUE NON VASCULAR CLINICAL DATA:  Right knee pain  EXAM: ULTRASOUND RIGHT LOWER EXTREMITY LIMITED  TECHNIQUE: Ultrasound examination of the lower extremity soft tissues was performed in the area of clinical concern.  COMPARISON:  None Available.  FINDINGS: Scanning in the area of clinical concern reveals a popliteal cyst which measures 3.7 x 1.3 x 1.8 cm. No evidence of rupture is seen. No other focal abnormality is noted.  IMPRESSION: Right popliteal cyst as described.  Electronically Signed   By: Oneil Devonshire M.D.   On: 04/10/2024 20:05 Note: Reviewed        Physical Exam  Vitals: BP 120/81 (BP Location: Right Arm, Patient Position: Sitting, Cuff Size: Normal)   Pulse 81   Temp (!) 97.2 F (36.2 C) (Temporal)   Resp 18   Ht 5' 2 (1.575 m)   Wt 160 lb (72.6 kg)   LMP  (LMP Unknown)   SpO2 98%   BMI 29.26 kg/m  BMI: Estimated body mass index is 29.26 kg/m as calculated from the following:   Height as of this encounter: 5' 2 (1.575 m).   Weight as of this encounter: 160 lb (72.6 kg). Ideal: Ideal body weight: 50.1 kg (110 lb 7.2 oz) Adjusted ideal body weight: 59.1 kg (130 lb 4.3 oz) General appearance: Well nourished, well developed, and well hydrated. In no apparent acute distress Mental  status: Alert, oriented x 3 (person, place, & time)       Respiratory: No evidence of acute respiratory distress Eyes: PERLA  Musculoskeletal: + Perineal pain Assessment   Diagnosis Status  1. Chronic pelvic pain in female (1ry area of Pain)   2. Chronic pain syndrome   3. Fibromyalgia   4. Vaginal pain (2ry area of Pain)   5. Visceral pain   6. Chronic use of opiate for therapeutic purpose   7. Encounter for chronic pain management    Controlled Controlled Controlled   Updated Problems: No problems updated.  Plan of Care  Problem-specific:  Assessment and Plan    Monitoring: [place] PMP: PDMP not reviewed this encounter.       Pharmacotherapy: No side-effects or adverse reactions reported. Compliance: No problems identified. Effectiveness: Clinically acceptable.  No notes on file  UDS:  Summary  Date Value Ref Range Status  11/14/2023 FINAL  Final    Comment:    ==================================================================== [employer] Select 13 (MW) ==================================================================== Specimen Alert Not Detected result may be consistent with the time of last use noted for  this medication. AS NEEDED (Oxycodone ) ==================================================================== Test                             Result       Flag       Units  Drug Absent but Declared for Prescription Verification   Oxycodone                       Not Detected UNEXPECTED ng/mg creat ==================================================================== Test                      Result    Flag   Units      Ref Range   Creatinine              43               mg/dL      >=79 ==================================================================== Declared Medications:  The flagging and interpretation on this report are based on the  following declared medications.  Unexpected results may arise from  inaccuracies in the declared medications.   **Note:  The testing scope of this panel includes these medications:   Oxycodone  (Percocet)   **Note: The testing scope of this panel does not include the  following reported medications:   Acetaminophen  (Percocet)  Amitriptyline  (Elavil )  Budesonide  (Symbicort )  Doxycycline   Fluticasone (Flonase)  Formoterol  (Symbicort )  Hydrocortisone   Ibuprofen (Advil)  Ivermectin   Levothyroxine  (Synthroid )  Lisinopril  (Zestril )  Loperamide (Imodium)  Multivitamin  Naloxone  (Narcan )  Venlafaxine  (Effexor ) ==================================================================== For clinical consultation, please call [phone]. ====================================================================     No results found for: CBDTHCR No results found for: D8THCCBX No results found for: D9THCCBX  ROS  Constitutional: Denies any fever or chills Gastrointestinal: No reported hemesis, hematochezia, vomiting, or acute GI distress Musculoskeletal: Denies any acute onset joint swelling, redness, loss of ROM, or weakness Neurological: No reported episodes of acute onset apraxia, aphasia, dysarthria, agnosia, amnesia, paralysis, loss of coordination, or loss of consciousness  Medication Review  Azelaic Acid , Threonine, amitriptyline , budesonide -formoterol , fluticasone, hydrocortisone , ibuprofen, levothyroxine , lisinopril , loperamide, multivitamin, naloxone , oxyCODONE -acetaminophen , and venlafaxine  XR  History Review  Allergy: Ms. Bai has no known allergies. Drug: Tammy Lamb  reports no history of drug use. Alcohol:  reports current alcohol use. Tobacco:  reports that she has never smoked. She has never used smokeless tobacco. Social: Tammy Lamb  reports that she has never smoked. She has never used smokeless tobacco. She reports current alcohol use. She reports that she does not use drugs. Medical:  has a past medical history of Actinic keratosis, Back pain, Basal cell carcinoma (07/14/2010), Bowel  trouble (2003), Breast screening, unspecified, Bronchitis, COLD (chronic obstructive lung disease) (HCC), COVID-19, Diffuse cystic mastopathy, Family history of malignant neoplasm of breast, Hypothyroidism, Lump or mass in breast, and Unspecified essential hypertension (2013). Surgical: Tammy Lamb  has a past surgical history that includes Ganglion cyst excision; Cesarean section (1995); Breast mass excision (Right, 2013); Rectal surgery (2002); Upper gi endoscopy (2003); Breast cyst excision (Right, 2013); Breast biopsy (Left, 08/16/2023); Breast biopsy (Left, 08/16/2023); and Colonoscopy with propofol  (N/A, 09/12/2023). Family: family history includes Breast cancer (age of onset: 22) in her mother; Depression in her daughter; Diabetes in her father and mother; Heart disease in her father and mother; Hypertension in her father and mother.  Laboratory Chemistry Profile   Renal Lab Results  Component Value Date   BUN 14 07/28/2024   CREATININE 0.73 07/28/2024  LABCREA 67.9 04/09/2015   BCR 19 07/28/2024   GFRAA 99 05/24/2020   GFRNONAA 86 05/24/2020    Hepatic Lab Results  Component Value Date   AST 16 07/28/2024   ALT 22 07/28/2024   ALBUMIN 4.8 07/28/2024   ALKPHOS 79 07/28/2024    Electrolytes Lab Results  Component Value Date   NA 139 07/28/2024   K 4.4 07/28/2024   CL 100 07/28/2024   CALCIUM 9.8 07/28/2024   MG 2.1 08/30/2015    Bone Lab Results  Component Value Date   VD25OH 35.4 06/13/2016   VD125OH2TOT 52.3 06/30/2015    Inflammation (CRP: Acute Phase) (ESR: Chronic Phase) Lab Results  Component Value Date   CRP 1.1 (H) 08/30/2015   ESRSEDRATE 2 01/03/2017         Note: Above Lab results reviewed.  Recent Imaging Review  US  RT LOWER EXTREM LTD SOFT TISSUE NON VASCULAR CLINICAL DATA:  Right knee pain  EXAM: ULTRASOUND RIGHT LOWER EXTREMITY LIMITED  TECHNIQUE: Ultrasound examination of the lower extremity soft tissues was performed in the area of clinical  concern.  COMPARISON:  None Available.  FINDINGS: Scanning in the area of clinical concern reveals a popliteal cyst which measures 3.7 x 1.3 x 1.8 cm. No evidence of rupture is seen. No other focal abnormality is noted.  IMPRESSION: Right popliteal cyst as described.  Electronically Signed   By: Oneil Devonshire M.D.   On: 04/10/2024 20:05 Note: Reviewed        Physical Exam  Vitals: LMP  (LMP Unknown)  BMI: Estimated body mass index is 29.52 kg/m as calculated from the following:   Height as of 07/28/24: 5' 2 (1.575 m).   Weight as of 07/28/24: 161 lb 6.4 oz (73.2 kg). Ideal: Ideal body weight: 50.1 kg (110 lb 7.2 oz) Adjusted ideal body weight: 59.3 kg (130 lb 13.3 oz) General appearance: Well nourished, well developed, and well hydrated. In no apparent acute distress Mental status: Alert, oriented x 3 (person, place, & time)       Respiratory: No evidence of acute respiratory distress Eyes: PERLA   Assessment   Diagnosis Status  No diagnosis found. Controlled Controlled Controlled   Updated Problems: No problems updated.  Plan of Care  Problem-specific:  Assessment and Plan            Tammy Lamb has a current medication list which includes the following long-term medication(s): amitriptyline , budesonide -formoterol , fluticasone, levothyroxine , lisinopril , oxycodone -acetaminophen , oxycodone -acetaminophen , oxycodone -acetaminophen , and venlafaxine  xr.  Pharmacotherapy (Medications Ordered): No orders of the defined types were placed in this encounter.  Orders:  No orders of the defined types were placed in this encounter.    {There is no content from the last Plan section.}   No follow-ups on file.    Recent Visits Date Type Provider Dept  05/08/24 Office Visit Menelik Mcfarren K, NP [facility]  Showing recent visits within past 90 days and meeting all other requirements Future Appointments Date Type Provider Dept  08/04/24 Appointment  Somtochukwu Woollard K, NP [facility]  Showing future appointments within next 90 days and meeting all other requirements  I discussed the assessment and treatment plan with the patient. The patient was provided an opportunity to ask questions and all were answered. The patient agreed with the plan and demonstrated an understanding of the instructions.  Patient advised to call back or seek an in-person evaluation if the symptoms or condition worsens.  Duration of encounter: *** minutes.  Total time on encounter, as per AMA guidelines included both the face-to-face and non-face-to-face time personally spent by the physician and/or other qualified health care professional(s) on the day of the encounter (includes time in activities that require the physician or other qualified health care professional and does not include time in activities normally performed by clinical staff). Physician's time may include the following activities when performed: Preparing to see the patient (e.g., pre-charting review of records, searching for previously ordered imaging, lab work, and nerve conduction tests) Review of prior analgesic pharmacotherapies. Reviewing PMP Interpreting ordered tests (e.g., lab work, imaging, nerve conduction tests) Performing post-procedure evaluations, including interpretation of diagnostic procedures Obtaining and/or reviewing separately obtained history Performing a medically appropriate examination and/or evaluation Counseling and educating the patient/family/caregiver Ordering medications, tests, or procedures Referring and communicating with other health care professionals (when not separately reported) Documenting clinical information in the electronic or other health record Independently interpreting results (not separately reported) and communicating results to the patient/ family/caregiver Care coordination (not separately reported)  Note by: Lorenza Shakir K Kayte Borchard, NP (TTS and AI  technology used. I apologize for any typographical errors that were not detected and corrected.) Date: 08/04/2024; Time: 8:41 AM  pulmonic trunk, indicative of pulmonary arterial hypertension. 4.  Emphysema (ICD10-J43.9).  Electronically Signed   By: Newell Eke M.D.   On: 07/28/2024 09:00 Note: {Blank single:19197::No new results found.,No results found under the [facility] electronic medical record.,Imaging results reviewed and explained to patient in Layman's terms.,Results of ordered imaging test(s) reviewed and explained to patient in Layman's terms.,Imaging results reviewed.,Reviewed} {Blank single:19197::Results visible under [facility].,Results visible under [facility].,Results visible under [facility].,Results visible under [facility].,Results visible under Care Everywhere.,Results made available to patient.,Copy of results provided to patient.,Patient noncompliant with diagnostic imaging orders.,     }  Physical Exam  Vitals: There were no vitals taken for this visit. BMI: Estimated body mass index is 34.61 kg/m as calculated from the following:   Height as of 07/04/24: 5' 5 (1.651 m).   Weight as of 07/04/24: 208 lb (94.3 kg). Ideal: Patient weight not recorded General appearance: {general exam:210120802::Well nourished, well developed, and well hydrated. In no apparent acute distress} Mental status: {Blank single:19197::Alert and oriented x 3. Exaggerated physical and/or psychosocial pain  behavior perceived.,Alert, oriented x 3 (person, place, & time)} {Blank single:19197::Tammy Lamb's speech pattern and demeanor seems to suggest oversedation,     } Respiratory: {Blank single:19197::Oxygen-dependent COPD,No evidence of acute respiratory distress} Eyes: {Blank single:19197::Miotic (pupilary constriction) due to opiate use,Midriatic,Anisocoric,Evidence of ptosis,Pin-point pupils,PERLA}   Assessment   Diagnosis Status  1. Radicular pain of thoracic region   2. Chronic pain syndrome   3. DDD (degenerative disc disease), cervical   4. Thoracic disc herniation   5. Inflammatory spondylopathy of cervical region   6. Chronic radicular cervical pain    {Blank single:19197::Absent,Deteriorating,Having a Flare-up,Improved,Improving,Not improving,Not responding,Persistent,Present,Recurring,Reoccurring,Resolved,Responding,Stable,Unchanged,improved,Worsened,Worsening,Controlled} {Blank single:19197::Absent,Deteriorating,Having a Flare-up,Improved,Improving,Not improving,Not responding,Persistent,Present,Recurring,Reoccurring,Resolved,Responding,Stable,Unchanged,improved,Worsened,Worsening,Controlled} {Blank single:19197::Absent,Deteriorating,Having a Flare-up,Improved,Improving,Not improving,Not responding,Persistent,Present,Recurring,Reoccurring,Resolved,Responding,Stable,Unchanged,improved,Worsened,Worsening,Controlled}   Updated Problems: No problems updated.  Plan of Care  Problem-specific:  Assessment and Plan            Ms. Magdelene Ruark Lamb has a current medication list which includes the following long-term medication(s): albuterol , atorvastatin , dicyclomine, duloxetine , gabapentin , metoprolol  tartrate, nitroglycerin , sulfasalazine , tizanidine , and valsartan.  Pharmacotherapy (Medications Ordered): No orders of the defined types were placed in  this encounter.  Orders:  No orders of the defined types were placed in this encounter.    {There is no content from the last Plan section.}   No follow-ups on file.    Recent Visits Date Type Provider Dept  05/12/24 Office Visit Tonyia Marschall K, NP [facility]  Showing recent visits within past 90 days and meeting all other requirements Future Appointments Date Type Provider Dept  08/04/24 Appointment Retal Tonkinson K, NP [facility]  Showing future appointments within next 90 days and meeting all other requirements  I discussed the assessment and treatment plan with the patient. The patient was provided an opportunity to ask questions and all were answered. The patient agreed with the plan and demonstrated an understanding of the instructions.  Patient advised to call back or seek an in-person evaluation if the symptoms or condition worsens.  Duration of encounter: *** minutes.  Total time on encounter, as per AMA guidelines included both the face-to-face and non-face-to-face time personally spent by the physician and/or other qualified health care professional(s) on the day of the encounter (includes time in activities that require the physician or other qualified health care professional and does not include time in activities normally performed by clinical staff). Physician's time may include the following activities when performed: Preparing to see the patient (e.g.,  pre-charting review of records, searching for previously ordered imaging, lab work, and nerve conduction tests) Review of prior analgesic pharmacotherapies. Reviewing PMP Interpreting ordered tests (e.g., lab work, imaging, nerve conduction tests) Performing post-procedure evaluations, including interpretation of diagnostic procedures Obtaining and/or reviewing separately obtained history Performing a medically appropriate examination and/or evaluation Counseling and educating the  patient/family/caregiver Ordering medications, tests, or procedures Referring and communicating with other health care professionals (when not separately reported) Documenting clinical information in the electronic or other health record Independently interpreting results (not separately reported) and communicating results to the patient/ family/caregiver Care coordination (not separately reported)  Note by: Audra Bellard K Kinley Ferrentino, NP (TTS and AI technology used. I apologize for any typographical errors that were not detected and corrected.) Date: 08/04/2024; Time: 1:15 PM

## 2024-08-03 ENCOUNTER — Other Ambulatory Visit: Payer: Self-pay | Admitting: Student in an Organized Health Care Education/Training Program

## 2024-08-03 DIAGNOSIS — M5030 Other cervical disc degeneration, unspecified cervical region: Secondary | ICD-10-CM

## 2024-08-03 DIAGNOSIS — M5414 Radiculopathy, thoracic region: Secondary | ICD-10-CM

## 2024-08-03 DIAGNOSIS — G8929 Other chronic pain: Secondary | ICD-10-CM

## 2024-08-03 DIAGNOSIS — M5124 Other intervertebral disc displacement, thoracic region: Secondary | ICD-10-CM

## 2024-08-03 DIAGNOSIS — M4692 Unspecified inflammatory spondylopathy, cervical region: Secondary | ICD-10-CM

## 2024-08-03 DIAGNOSIS — G894 Chronic pain syndrome: Secondary | ICD-10-CM

## 2024-08-04 ENCOUNTER — Ambulatory Visit: Admitting: Nurse Practitioner

## 2024-08-04 DIAGNOSIS — M4692 Unspecified inflammatory spondylopathy, cervical region: Secondary | ICD-10-CM

## 2024-08-04 DIAGNOSIS — Z91199 Patient's noncompliance with other medical treatment and regimen due to unspecified reason: Secondary | ICD-10-CM

## 2024-08-04 DIAGNOSIS — G894 Chronic pain syndrome: Secondary | ICD-10-CM

## 2024-08-04 DIAGNOSIS — M5124 Other intervertebral disc displacement, thoracic region: Secondary | ICD-10-CM

## 2024-08-04 DIAGNOSIS — M5030 Other cervical disc degeneration, unspecified cervical region: Secondary | ICD-10-CM

## 2024-08-04 DIAGNOSIS — G8929 Other chronic pain: Secondary | ICD-10-CM

## 2024-08-04 DIAGNOSIS — M5414 Radiculopathy, thoracic region: Secondary | ICD-10-CM

## 2024-08-07 ENCOUNTER — Telehealth: Payer: Self-pay | Admitting: Student in an Organized Health Care Education/Training Program

## 2024-08-07 NOTE — Telephone Encounter (Signed)
 Pt stated she needs prior auth for her gabapentin 

## 2024-08-11 ENCOUNTER — Other Ambulatory Visit: Payer: Self-pay | Admitting: *Deleted

## 2024-08-11 DIAGNOSIS — M5030 Other cervical disc degeneration, unspecified cervical region: Secondary | ICD-10-CM

## 2024-08-11 DIAGNOSIS — M5414 Radiculopathy, thoracic region: Secondary | ICD-10-CM

## 2024-08-11 DIAGNOSIS — G8929 Other chronic pain: Secondary | ICD-10-CM

## 2024-08-11 DIAGNOSIS — M4692 Unspecified inflammatory spondylopathy, cervical region: Secondary | ICD-10-CM

## 2024-08-11 DIAGNOSIS — M5124 Other intervertebral disc displacement, thoracic region: Secondary | ICD-10-CM

## 2024-08-11 DIAGNOSIS — G894 Chronic pain syndrome: Secondary | ICD-10-CM

## 2024-08-11 MED ORDER — GABAPENTIN 300 MG PO CAPS: 900.0000 mg | ORAL_CAPSULE | Freq: Two times a day (BID) | ORAL | 5 refills | Status: AC

## 2024-08-11 NOTE — Telephone Encounter (Signed)
 PA done

## 2024-08-11 NOTE — Telephone Encounter (Signed)
 After extensive conversation with patient, it is decided that a refill is needed, not PA.

## 2024-08-22 ENCOUNTER — Ambulatory Visit: Admitting: Pulmonary Disease

## 2024-08-22 ENCOUNTER — Ambulatory Visit

## 2024-08-22 ENCOUNTER — Encounter: Payer: Self-pay | Admitting: Pulmonary Disease

## 2024-08-22 VITALS — BP 124/86 | HR 50 | Temp 98.0°F | Ht 65.0 in | Wt 207.0 lb

## 2024-08-22 DIAGNOSIS — J449 Chronic obstructive pulmonary disease, unspecified: Secondary | ICD-10-CM

## 2024-08-22 DIAGNOSIS — R911 Solitary pulmonary nodule: Secondary | ICD-10-CM | POA: Diagnosis not present

## 2024-08-22 DIAGNOSIS — K219 Gastro-esophageal reflux disease without esophagitis: Secondary | ICD-10-CM

## 2024-08-22 DIAGNOSIS — Z87891 Personal history of nicotine dependence: Secondary | ICD-10-CM | POA: Diagnosis not present

## 2024-08-22 DIAGNOSIS — R0602 Shortness of breath: Secondary | ICD-10-CM

## 2024-08-22 LAB — PULMONARY FUNCTION TEST
DL/VA % pred: 76 %
DL/VA: 3.17 ml/min/mmHg/L
DLCO unc % pred: 72 %
DLCO unc: 14.26 ml/min/mmHg
FEF 25-75 Post: 1.6 L/s
FEF 25-75 Pre: 1.03 L/s
FEF2575-%Change-Post: 55 %
FEF2575-%Pred-Post: 88 %
FEF2575-%Pred-Pre: 56 %
FEV1-%Change-Post: 11 %
FEV1-%Pred-Post: 81 %
FEV1-%Pred-Pre: 73 %
FEV1-Post: 1.86 L
FEV1-Pre: 1.67 L
FEV1FVC-%Change-Post: 0 %
FEV1FVC-%Pred-Pre: 92 %
FEV6-%Change-Post: 11 %
FEV6-%Pred-Post: 91 %
FEV6-%Pred-Pre: 82 %
FEV6-Post: 2.62 L
FEV6-Pre: 2.36 L
FEV6FVC-%Change-Post: 0 %
FEV6FVC-%Pred-Post: 104 %
FEV6FVC-%Pred-Pre: 104 %
FVC-%Change-Post: 10 %
FVC-%Pred-Post: 87 %
FVC-%Pred-Pre: 78 %
FVC-Post: 2.64 L
FVC-Pre: 2.38 L
Post FEV1/FVC ratio: 70 %
Post FEV6/FVC ratio: 99 %
Pre FEV1/FVC ratio: 70 %
Pre FEV6/FVC Ratio: 99 %
RV % pred: 111 %
RV: 2.57 L
TLC % pred: 105 %
TLC: 5.49 L

## 2024-08-22 NOTE — Progress Notes (Signed)
 "  Subjective:    Patient ID: Tammy Lamb, female    DOB: [birth year], 74 y.o.   MRN: [id number]  Patient Care Team: Sherial Bail, MD as PCP - General (Internal Medicine) Darliss Rogue, MD as PCP - Cardiology (Cardiology) Tamea Dedra CROME, MD as Consulting Physician (Pulmonary Disease)  Chief Complaint  Patient presents with   COPD    Shortness of breath on exertion. Occasional cough and wheezing.     BACKGROUND/INTERVAL:Patient is a 74 year old recent former smoker (March 2024, 49 PY) history as noted below who presents for follow-up of an lung nodule incidentally found on lung cancer screening CT. She was also noted to have a giant paraesophageal hernia with intrathoracic stomach. She underwent surgery for repair on 19 June 2023. She did have a somewhat complicated postsurgical course.  Previously patient had stent placement in March 2024 for unstable angina stent was placed to the RCA lesion that was the culprit.  Patient was last seen on 30 June 2024, respiratory wise, no new issues.  She presents today for review of her PET/CT and pulmonary function test which she had today.  HPI Discussed the use of AI scribe software for clinical note transcription with the patient, who gave verbal consent to proceed.  History of Present Illness   Tammy Lamb is a 74 year old female with COPD and a lung nodule who presents for evaluation of her lung nodule.  She presents with her husband, [name].  She has a lung nodule approximately 8 mm in size, located in a challenging anatomical position for biopsy. She has not had a biopsy before and is apprehensive about the procedure. No history of cancer elsewhere in her body.  She experiences nasal congestion at night and her mouth gets very dry with her medication. No cough is present.  She reports having issues with her esophagus, stating 'I'm having a lot of esophagus problems right now.' She is scheduled for a procedure next Friday  to investigate her esophagus due to acid reflux, which has been described as 'erupted.'  Recall she had a giant paraesophageal hernia that was repaired due to severe symptoms mostly shortness of breath.  This was performed in November 2024.  She continues to have issues with this and is going for an esophageal motility study.  She had upper endoscopy on 04 July 2024 that showed esophagitis and a recurrent hiatal hernia.     Review of Systems A 10 point review of systems was performed and it is as noted above otherwise negative.   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 11/08/2023   Acute on chronic diastolic CHF (congestive heart failure) (HCC) 06/22/2023   COPD (chronic obstructive pulmonary disease) (HCC) 06/21/2023   Acute hypoxic respiratory failure (HCC) 06/21/2023   Elevated LFTs 06/21/2023   Protein calorie malnutrition 06/21/2023   Depression 06/21/2023   Acute anemia 06/21/2023   Hyponatremia 06/21/2023   Hiatal hernia with gastroesophageal reflux 06/19/2023   S/P repair of paraesophageal hernia 06/19/2023   Primary osteoarthritis of right knee 03/27/2023   Chronic pain of left knee 03/27/2023   Influenza A 10/20/2022   Coronavirus infection 10/20/2022   Chest pain 10/19/2022   Coronary artery disease involving native coronary artery of native heart with unstable angina pectoris (HCC) 10/19/2022   Status post primary angioplasty with coronary stent 10/19/2022   Unstable angina (HCC) 10/18/2022   COPD with acute  exacerbation (HCC) 10/18/2022   AKI (acute kidney injury) 10/18/2022   Ulcerative colitis (HCC) 10/18/2022   Bilateral hip pain 12/26/2021   Thoracic disc herniation 08/25/2021   Radicular pain of thoracic region 08/25/2021   Atherosclerotic peripheral vascular disease with intermittent claudication 03/08/2021   Benign essential HTN 03/08/2021   Bilateral carotid  artery stenosis 03/08/2021   Hyperlipidemia, mixed 03/08/2021   Bilateral occipital neuralgia 09/16/2020   Chronic SI joint pain 09/16/2020   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   High-tone pelvic floor dysfunction 08/21/2019   Recurrent UTI 08/21/2019   S/P cervical spinal fusion (C3-C4 ACDF (03/2019) 04/29/2019   Cervicalgia 04/29/2019   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   Cervical facet joint syndrome 10/01/2018   DDD (degenerative disc disease), cervical 10/01/2018   Chronic radicular cervical pain 10/01/2018   Compression fracture of lumbar vertebra, non-traumatic, sequela 02/28/2018   Lumbar post-laminectomy syndrome 02/28/2018   Spinal stenosis, lumbar region, with neurogenic claudication 09/27/2017   Lumbar radiculopathy 09/27/2017   Lumbar degenerative disc disease 09/27/2017   Lumbar facet arthropathy 09/27/2017   Chronic pain syndrome 09/27/2017   Neurogenic claudication 05/30/2017    Social History   Tobacco Use   Smoking status: Former    Current packs/day: 0.00    Average packs/day: 1 pack/day for 56.2 years (56.2 ttl pk-yrs)    Types: Cigarettes    Start date: 72    Quit date: 09/22/2022    Years since quitting: 1.9    Passive exposure: Past   Smokeless tobacco: Never   Tobacco comments:    Quit in March of 2024.  Substance Use Topics   Alcohol use: Not Currently    Comment: Occassional,    Allergies[1]  Active Medications[2]  Immunization History  Administered Date(s) Administered   Fluad Trivalent(High Dose 65+) 05/01/2023   INFLUENZA, HIGH DOSE SEASONAL PF 04/14/2024   Influenza Inj Mdck Quad Pf 05/17/2021   Janssen (J&J) SARS-COV-2 Vaccination 11/24/2019   Moderna Sars-Covid-2 Vaccination 07/25/2020   PNEUMOCOCCAL CONJUGATE-20 06/28/2022   Tdap 05/17/2021        Objective:     Vitals:   08/22/24 0945  BP: 124/86  Pulse: (!) 50  Temp: 98 F (36.7 C)  Height: 5' 5 (1.651 m)  Weight: 207 lb (93.9 kg)  SpO2:  98%  TempSrc: Temporal  BMI (Calculated): 34.45     SpO2: 98 %  GENERAL: Obese woman, no acute distress, fully ambulatory, no conversational dyspnea. HEAD: Normocephalic, atraumatic.  EYES: Pupils equal, round, reactive to light.  No scleral icterus.  MOUTH: Poor dentition.  Multiple fillings noted at teeth.  Oral mucosa moist.  No thrush. NECK: Supple. No thyromegaly. Trachea midline. No JVD.  No adenopathy. PULMONARY: Good air entry bilaterally.  Coarse, faint end expiratory wheezes. CARDIOVASCULAR: S1 and S2. Regular rate and rhythm.  No rubs, murmurs or gallops heard. ABDOMEN: Obese, otherwise benign. MUSCULOSKELETAL: No joint deformity, no clubbing, no edema.  NEUROLOGIC: No overt focal deficit, no gait disturbance, speech is fluent. SKIN: Intact,warm,dry.  On limited exam, no overt rashes. PSYCH: Mood and behavior normal.  Recent Results (from the past 2160 hours)  Pulmonary function test     Status: None (Preliminary result)   Collection Time: 08/22/24  8:27 AM  Result Value Ref Range   FVC-Pre 2.38 L   FVC-%Pred-Pre 78 %   FVC-Post 2.64 L   FVC-%Pred-Post 87 %   FVC-%Change-Post 10 %   FEV1-Pre 1.67 L   FEV1-%Pred-Pre 73 %   FEV1-Post 1.86  L   FEV1-%Pred-Post 81 %   FEV1-%Change-Post 11 %   FEV6-Pre 2.36 L   FEV6-%Pred-Pre 82 %   FEV6-Post 2.62 L   FEV6-%Pred-Post 91 %   FEV6-%Change-Post 11 %   Pre FEV1/FVC ratio 70 %   FEV1FVC-%Pred-Pre 92 %   Post FEV1/FVC ratio 70 %   FEV1FVC-%Change-Post 0 %   Pre FEV6/FVC Ratio 99 %   FEV6FVC-%Pred-Pre 104 %   Post FEV6/FVC ratio 99 %   FEV6FVC-%Pred-Post 104 %   FEV6FVC-%Change-Post 0 %   FEF 25-75 Pre 1.03 L/sec   FEF2575-%Pred-Pre 56 %   FEF 25-75 Post 1.60 L/sec   FEF2575-%Pred-Post 88 %   FEF2575-%Change-Post 55 %   RV 2.57 L   RV % pred 111 %   TLC 5.49 L   TLC % pred 105 %   DLCO unc 14.26 ml/min/mmHg   DLCO unc % pred 72 %   DL/VA 6.82 ml/min/mmHg/L   DL/VA % pred 76 %  *PFTs consistent with COPD  stage I-II       Assessment & Plan:     ICD-10-CM   1. Lung nodule  R91.1    8mm, RUL, subsolid    2. Stage 2 moderate COPD by GOLD classification (HCC)  J44.9     3. Hiatal hernia with GERD  K44.9    K21.9      Discussion:    Pulmonary nodule Intermediate activity on PET CT, approximately 8 mm, located in a challenging anatomical position but biopsy still possible. Differential diagnosis includes slow-growing cancer, but further evaluation is needed. Biopsy requires general anesthesia and robotic assistance due to nodule's location. Local radiation therapy is a potential treatment if biopsy confirms malignancy, with anticipated esophageal inflammation as a side effect. Blood test to assess likelihood of malignancy is planned to guide further management.  Patient is still undecided with regards to biopsy. - Ordered blood test to assess likelihood of malignancy (lung life AI) - Will refer to [facility]bot[facility] - Will schedule follow-up appointment in 3-4 weeks to discuss blood test results and further management  Chronic obstructive pulmonary disease Mild wheezing on examination.  PFTs consistent with stage I-II COPD. - Continue Breztri  - Continue as needed albuterol    Follow-up in 3 weeks time.  Advised if symptoms do not improve or worsen, to please contact office for sooner follow up or seek emergency care.    I spent 40 minutes of dedicated to the care of this patient on the date of this encounter to include pre-visit review of records, face-to-face time with the patient discussing conditions above, post visit ordering of testing, clinical documentation with the electronic health record, making appropriate referrals as documented, and communicating necessary findings to members of the patients care team.     C. Leita Sanders, MD Advanced Bronchoscopy P[facility]    *This note was generated using voice recognition software/Dragon  and/or AI transcription program.  Despite best efforts to proofread, errors can occur which can change the meaning. Any transcriptional errors that result from this process are unintentional and may not be fully corrected at the time of dictation.     [1]  Allergies Allergen Reactions   Aspirin  Other (See Comments)    GERD   Sulfa Antibiotics Nausea And Vomiting  [2]  Current Meds  Medication Sig   acetaminophen  (TYLENOL ) 500 MG tablet Take 1,000 mg by mouth every 6 (six) hours as needed for moderate pain or headache.   albuterol  (VENTOLIN  HFA) 108 (  90 Base) MCG/ACT inhaler Inhale 2 puffs into the lungs every 6 (six) hours as needed for wheezing or shortness of breath.   ascorbic acid  (VITAMIN C) 1000 MG tablet Take 1,000 mg by mouth once a week.   aspirin  EC (ASPIRIN  LOW DOSE) 81 MG tablet TAKE 1 TABLET (81 MG TOTAL) BY MOUTH DAILY. SWALLOW WHOLE.   atorvastatin  (LIPITOR ) 80 MG tablet Take 80 mg by mouth at bedtime.   budesonide-glycopyrrolate -formoterol  (BREZTRI  AEROSPHERE) 160-9-4.8 MCG/ACT AERO inhaler Inhale 2 puffs into the lungs in the morning and at bedtime.   cephALEXin  (KEFLEX ) 250 MG capsule Take 1 capsule (250 mg total) by mouth daily.   Cinnamon 500 MG TABS Take 500 mg by mouth in the morning and at bedtime.   citalopram  (CELEXA ) 20 MG tablet Take 20 mg by mouth every morning.   dicyclomine (BENTYL) 10 MG capsule Take 10 mg by mouth 3 (three) times daily before meals.   DULoxetine  (CYMBALTA ) 60 MG capsule Take 60 mg by mouth daily.   gabapentin  (NEURONTIN ) 300 MG capsule Take 3 capsules (900 mg total) by mouth 2 (two) times daily.   HYDROcodone -acetaminophen  (NORCO) 7.5-325 MG tablet Take 1 tablet by mouth every 8 (eight) hours as needed.   hydrOXYzine  (ATARAX ) 25 MG tablet Take 1 tablet (25 mg total) by mouth at bedtime.   metoprolol  tartrate (LOPRESSOR ) 25 MG tablet Take 1 tablet (25 mg total) by mouth 2 (two) times daily.   Multiple Vitamins-Minerals (HAIR SKIN NAILS PO)  Take 1 tablet by mouth in the morning and at bedtime.   naloxone  (NARCAN ) nasal spray 4 mg/0.1 mL Place 1 spray into the nose as needed for up to 365 doses (for opioid-induced respiratory depresssion). In case of emergency (overdose), spray once into each nostril. If no response within 3 minutes, repeat application and call 911.   nitroGLYCERIN  (NITROSTAT ) 0.4 MG SL tablet Place 1 tablet (0.4 mg total) under the tongue every 5 (five) minutes as needed for chest pain.   omeprazole -sodium bicarbonate (ZEGERID) 40-1100 MG capsule Take 1 capsule by mouth daily before breakfast.   oxybutynin  (DITROPAN -XL) 10 MG 24 hr tablet Take 1 tablet (10 mg total) by mouth at bedtime.   Pentosan Polysulfate Sodium  150 MG CPDR Take 1 capsule by mouth 2 (two) times daily.   sulfaSALAzine  (AZULFIDINE ) 500 MG tablet Take 500 mg by mouth 2 (two) times daily.   tiZANidine  (ZANAFLEX ) 4 MG tablet Take 1 tablet (4 mg total) by mouth every 12 (twelve) hours as needed.   TURMERIC CURCUMIN PO Take 1 capsule by mouth daily.   valsartan (DIOVAN) 160 MG tablet Take 160 mg by mouth daily.   vitamin B-12 (CYANOCOBALAMIN ) 1000 MCG tablet Take 1,000 mcg by mouth daily.   "

## 2024-08-22 NOTE — Patient Instructions (Signed)
 Full PFT completed today ? ?

## 2024-08-22 NOTE — Progress Notes (Signed)
 Full PFT completed today ? ?

## 2024-08-29 ENCOUNTER — Telehealth: Payer: Self-pay

## 2024-08-29 NOTE — Telephone Encounter (Signed)
 Copied from CRM #[id number]. Topic: Clinical - Request for Lab/Test Order >> Aug 29, 2024  9:06 AM Tammy Lamb wrote: Reason for CRM: patient is calling because some people is suppose to come to her house to get the blood work that dr tamea couldn't get patient says no one has shown up yet please give patient a call  [phone]

## 2024-08-29 NOTE — Telephone Encounter (Signed)
 The blood draw will be through [facility].  They are a little behind because of the weather but they should be contacting her.  All the information was sent to [facility].

## 2024-09-19 ENCOUNTER — Ambulatory Visit: Admitting: Pulmonary Disease

## 2024-12-08 ENCOUNTER — Ambulatory Visit: Admitting: Urology
